# Patient Record
Sex: Female | Born: 1970
Health system: Southern US, Community
[De-identification: ages and names within clinical notes are randomized; demographics above are authoritative.]

## PROBLEM LIST (undated history)

## (undated) ENCOUNTER — Emergency Department (HOSPITAL_COMMUNITY): Admission: EM | Payer: Medicaid Other

## (undated) DIAGNOSIS — C259 Malignant neoplasm of pancreas, unspecified: Secondary | ICD-10-CM

## (undated) DIAGNOSIS — Z8049 Family history of malignant neoplasm of other genital organs: Secondary | ICD-10-CM

## (undated) DIAGNOSIS — K8020 Calculus of gallbladder without cholecystitis without obstruction: Secondary | ICD-10-CM

## (undated) DIAGNOSIS — Z8042 Family history of malignant neoplasm of prostate: Secondary | ICD-10-CM

## (undated) DIAGNOSIS — Z801 Family history of malignant neoplasm of trachea, bronchus and lung: Secondary | ICD-10-CM

## (undated) DIAGNOSIS — S270XXA Traumatic pneumothorax, initial encounter: Secondary | ICD-10-CM

## (undated) DIAGNOSIS — R112 Nausea with vomiting, unspecified: Secondary | ICD-10-CM

## (undated) DIAGNOSIS — Z9889 Other specified postprocedural states: Secondary | ICD-10-CM

## (undated) HISTORY — DX: Family history of malignant neoplasm of other genital organs: Z80.49

## (undated) HISTORY — DX: Family history of malignant neoplasm of trachea, bronchus and lung: Z80.1

## (undated) HISTORY — DX: Family history of malignant neoplasm of prostate: Z80.42

## (undated) HISTORY — PX: TONSILLECTOMY: SUR1361

---

## 2017-10-16 DIAGNOSIS — K8020 Calculus of gallbladder without cholecystitis without obstruction: Secondary | ICD-10-CM

## 2017-10-16 HISTORY — DX: Calculus of gallbladder without cholecystitis without obstruction: K80.20

## 2017-10-30 ENCOUNTER — Inpatient Hospital Stay (HOSPITAL_COMMUNITY): Payer: Medicaid Other

## 2017-10-30 ENCOUNTER — Emergency Department (HOSPITAL_COMMUNITY): Payer: Medicaid Other

## 2017-10-30 ENCOUNTER — Inpatient Hospital Stay (HOSPITAL_COMMUNITY)
Admission: EM | Admit: 2017-10-30 | Discharge: 2017-10-31 | DRG: 436 | Disposition: A | Discharge status: Home / Self Care | Payer: Medicaid Other | Attending: Internal Medicine | Admitting: Internal Medicine

## 2017-10-30 ENCOUNTER — Other Ambulatory Visit: Payer: Self-pay

## 2017-10-30 ENCOUNTER — Encounter (HOSPITAL_COMMUNITY): Payer: Self-pay

## 2017-10-30 DIAGNOSIS — R748 Abnormal levels of other serum enzymes: Secondary | ICD-10-CM | POA: Diagnosis present

## 2017-10-30 DIAGNOSIS — C259 Malignant neoplasm of pancreas, unspecified: Secondary | ICD-10-CM | POA: Diagnosis present

## 2017-10-30 DIAGNOSIS — R1011 Right upper quadrant pain: Secondary | ICD-10-CM

## 2017-10-30 DIAGNOSIS — K819 Cholecystitis, unspecified: Secondary | ICD-10-CM | POA: Diagnosis present

## 2017-10-30 DIAGNOSIS — K8020 Calculus of gallbladder without cholecystitis without obstruction: Secondary | ICD-10-CM

## 2017-10-30 DIAGNOSIS — R7989 Other specified abnormal findings of blood chemistry: Secondary | ICD-10-CM

## 2017-10-30 DIAGNOSIS — R17 Unspecified jaundice: Secondary | ICD-10-CM

## 2017-10-30 DIAGNOSIS — R945 Abnormal results of liver function studies: Secondary | ICD-10-CM

## 2017-10-30 DIAGNOSIS — L298 Other pruritus: Secondary | ICD-10-CM | POA: Diagnosis not present

## 2017-10-30 DIAGNOSIS — K831 Obstruction of bile duct: Secondary | ICD-10-CM

## 2017-10-30 DIAGNOSIS — Z720 Tobacco use: Secondary | ICD-10-CM | POA: Diagnosis not present

## 2017-10-30 DIAGNOSIS — K8689 Other specified diseases of pancreas: Secondary | ICD-10-CM

## 2017-10-30 DIAGNOSIS — K8041 Calculus of bile duct with cholecystitis, unspecified, with obstruction: Secondary | ICD-10-CM | POA: Diagnosis present

## 2017-10-30 DIAGNOSIS — F17210 Nicotine dependence, cigarettes, uncomplicated: Secondary | ICD-10-CM | POA: Diagnosis present

## 2017-10-30 DIAGNOSIS — L299 Pruritus, unspecified: Secondary | ICD-10-CM | POA: Diagnosis present

## 2017-10-30 DIAGNOSIS — F1010 Alcohol abuse, uncomplicated: Secondary | ICD-10-CM | POA: Diagnosis not present

## 2017-10-30 DIAGNOSIS — Z91040 Latex allergy status: Secondary | ICD-10-CM | POA: Diagnosis not present

## 2017-10-30 HISTORY — DX: Calculus of gallbladder without cholecystitis without obstruction: K80.20

## 2017-10-30 HISTORY — DX: Traumatic pneumothorax, initial encounter: S27.0XXA

## 2017-10-30 LAB — CBC WITH DIFFERENTIAL/PLATELET
Abs Immature Granulocytes: 0.03 10*3/uL (ref 0.00–0.07)
BASOS PCT: 0 %
Basophils Absolute: 0 10*3/uL (ref 0.0–0.1)
EOS PCT: 3 %
Eosinophils Absolute: 0.2 10*3/uL (ref 0.0–0.5)
HCT: 33.2 % — ABNORMAL LOW (ref 36.0–46.0)
Hemoglobin: 11 g/dL — ABNORMAL LOW (ref 12.0–15.0)
Immature Granulocytes: 0 %
Lymphocytes Relative: 19 %
Lymphs Abs: 1.3 10*3/uL (ref 0.7–4.0)
MCH: 21.3 pg — AB (ref 26.0–34.0)
MCHC: 33.1 g/dL (ref 30.0–36.0)
MCV: 64.3 fL — ABNORMAL LOW (ref 80.0–100.0)
MONO ABS: 0.4 10*3/uL (ref 0.1–1.0)
Monocytes Relative: 6 %
Neutro Abs: 4.8 10*3/uL (ref 1.7–7.7)
Neutrophils Relative %: 72 %
PLATELETS: 341 10*3/uL (ref 150–400)
RBC: 5.16 MIL/uL — AB (ref 3.87–5.11)
RDW: 17.9 % — ABNORMAL HIGH (ref 11.5–15.5)
WBC: 6.7 10*3/uL (ref 4.0–10.5)
nRBC: 0 % (ref 0.0–0.2)

## 2017-10-30 LAB — LIPASE, BLOOD: LIPASE: 39 U/L (ref 11–51)

## 2017-10-30 LAB — COMPREHENSIVE METABOLIC PANEL
ALK PHOS: 347 U/L — AB (ref 38–126)
ALT: 198 U/L — ABNORMAL HIGH (ref 0–44)
ANION GAP: 10 (ref 5–15)
AST: 135 U/L — ABNORMAL HIGH (ref 15–41)
Albumin: 3.5 g/dL (ref 3.5–5.0)
BUN: 8 mg/dL (ref 6–20)
CALCIUM: 9.1 mg/dL (ref 8.9–10.3)
CO2: 22 mmol/L (ref 22–32)
Chloride: 103 mmol/L (ref 98–111)
Creatinine, Ser: 0.64 mg/dL (ref 0.44–1.00)
GFR calc non Af Amer: >60 mL/min (ref >60)
Glucose, Bld: 106 mg/dL — ABNORMAL HIGH (ref 70–99)
Potassium: 3 mmol/L — ABNORMAL LOW (ref 3.5–5.1)
SODIUM: 135 mmol/L (ref 135–145)
Total Bilirubin: 6.6 mg/dL — ABNORMAL HIGH (ref 0.3–1.2)
Total Protein: 6.8 g/dL (ref 6.5–8.1)

## 2017-10-30 LAB — PROTIME-INR
INR: 0.92
Prothrombin Time: 12.3 seconds (ref 11.4–15.2)

## 2017-10-30 LAB — ETHANOL: Alcohol, Ethyl (B): <10 mg/dL (ref <10)

## 2017-10-30 LAB — I-STAT BETA HCG BLOOD, ED (MC, WL, AP ONLY)

## 2017-10-30 LAB — ACETAMINOPHEN LEVEL: Acetaminophen (Tylenol), Serum: <10 ug/mL — ABNORMAL LOW (ref 10–30)

## 2017-10-30 MED ORDER — LORAZEPAM 2 MG/ML IJ SOLN
1.0000 mg | Freq: Once | INTRAMUSCULAR | Status: AC
  Administered 2017-10-30: 1 mg via INTRAVENOUS
  Filled 2017-10-30: qty 1

## 2017-10-30 MED ORDER — LACTATED RINGERS IV SOLN
INTRAVENOUS | Status: DC
  Administered 2017-10-30 – 2017-10-31 (×4): via INTRAVENOUS

## 2017-10-30 MED ORDER — HYDROXYZINE HCL 50 MG/ML IM SOLN
50.0000 mg | Freq: Four times a day (QID) | INTRAMUSCULAR | Status: DC | PRN
  Administered 2017-10-30 – 2017-10-31 (×3): 50 mg via INTRAMUSCULAR
  Filled 2017-10-30 (×5): qty 1

## 2017-10-30 MED ORDER — DIPHENHYDRAMINE HCL 50 MG/ML IJ SOLN
25.0000 mg | Freq: Once | INTRAMUSCULAR | Status: AC
  Administered 2017-10-30: 25 mg via INTRAVENOUS
  Filled 2017-10-30: qty 1

## 2017-10-30 MED ORDER — GADOBUTROL 1 MMOL/ML IV SOLN
7.0000 mL | Freq: Once | INTRAVENOUS | Status: AC | PRN
  Administered 2017-10-30: 7 mL via INTRAVENOUS

## 2017-10-30 MED ORDER — ACETAMINOPHEN 325 MG PO TABS: 650.0000 mg | ORAL_TABLET | Freq: Four times a day (QID) | ORAL | Status: DC | PRN

## 2017-10-30 MED ORDER — NICOTINE 14 MG/24HR TD PT24
14.0000 mg | MEDICATED_PATCH | Freq: Every day | TRANSDERMAL | Status: DC
  Administered 2017-10-30 – 2017-10-31 (×2): 14 mg via TRANSDERMAL
  Filled 2017-10-30 (×2): qty 1

## 2017-10-30 MED ORDER — ACETAMINOPHEN 650 MG RE SUPP: 650.0000 mg | Freq: Four times a day (QID) | RECTAL | Status: DC | PRN

## 2017-10-30 MED ORDER — ONDANSETRON HCL 4 MG/2ML IJ SOLN: 4.0000 mg | Freq: Four times a day (QID) | INTRAMUSCULAR | Status: DC | PRN

## 2017-10-30 MED ORDER — CAMPHOR-MENTHOL 0.5-0.5 % EX LOTN
TOPICAL_LOTION | CUTANEOUS | Status: DC | PRN
  Administered 2017-10-30: 19:00:00 via TOPICAL
  Filled 2017-10-30: qty 222

## 2017-10-30 MED ORDER — MORPHINE SULFATE (PF) 2 MG/ML IV SOLN
2.0000 mg | INTRAVENOUS | Status: DC | PRN
  Administered 2017-10-30 – 2017-10-31 (×5): 2 mg via INTRAVENOUS
  Filled 2017-10-30 (×5): qty 1

## 2017-10-30 MED ORDER — ONDANSETRON HCL 4 MG PO TABS: 4.0000 mg | ORAL_TABLET | Freq: Four times a day (QID) | ORAL | Status: DC | PRN

## 2017-10-30 NOTE — H&P (View-Only) (Signed)
[facility] Consult: 1:39 PM 10/30/2017  LOS: 0 days    Referring Provider: Karmen Bongo MD  Primary Care Physician:  Patient, No Pcp Per Primary Gastroenterologist:  Althia Forts.       Reason for Consultation:  Jaundice, mass in head of pancrease on MR.     HPI: Jane Brooks is a 47 y.o. female.   PMH listed below. Moved to this area a few years ago from [place]. 3 weeks ago developed pain in the right upper quadrant which was quite tender to palpation.  Pain resolved but she developed pruritus which she initially attributed to a body wash product.  However she stopped using the product and the itch never resolved.  About a week after the pain resolved she saw her urine turned dark and it remained so despite her increasing her water consumption.  Stools turned gray.  Friends noticed scleral icterus.  She googled and realize she was jaundiced and continue to drink lots of water and take Benadryl for the itching.  Symptoms became intolerable and she presented to the ED.  Total bilirubin 6.6.  Alkaline phosphatase 347.  AST/ALT 135/198.  Lipase 39.  Potassium 3.  Renal function normal. Hgb 11, MCV 64.  Platelets normal. PT/INR not yet obtained.   Abdominal ultrasound with stone and sludge filled contracted gallbladder with gallbladder wall thickening.  Dominant extrahepatic bile ducts with 17.6 mm CBD.  Portal vein patent with normal directional flow by Doppler. MRI/MRCP: Marketed intra-and extrahepatic biliary ductal dilatation.  Abrupt cut off of the CBD at the pancreatic head.  Marketed dilation of main pancreatic duct also with abrupt cut off at the level of the pancreatic head.  Atrophy of the pancreatic parenchyma in the body and tail.  Subtle, slightly hypoenhancing,  2.5 x 3 cm lesion in the head  of the pancreas.  Altogether findings concerning for pancreatic adenocarcinoma.  EUS recommended to further evaluate.  PV patent although there is some mass-effect at the portal splenic confluence and IVC.  Fat planes around the celiac axis and SMA appear preserved.  Upper normal to borderline portocaval lymph node and small right para-aortic lymphadenopathy.   Pruritus is her biggest complaint. She is hungry but when she eats she tends to get nauseated and will vomit sometimes.  However right now she is asking if she can eat solid food.  Raquel Sarna history of gallbladder disease and uterine cancer in her mother who is living.  Alcohol history significant for heavy drinking regularly when she was in her 63s and 30s.  In the last 20 years she has been a binge drinker.  She will not drink for 3 or more months at a time but when she does drink she drinks for a week and can consume 2-3 bottles of wine daily during binge episodes.   Past Medical History:  Diagnosis Date  . Gallstones 10/2017  . Pneumothorax, closed, traumatic    years ago    Past Surgical History:  Procedure Laterality Date  . TONSILLECTOMY      Prior to Admission medications  Medication Sig Start Date End Date Taking? Authorizing Provider  diphenhydrAMINE (BENADRYL) 25 MG tablet Take 25 mg by mouth every 6 (six) hours as needed for itching.   Yes [provider]    Scheduled Meds: . nicotine  14 mg Transdermal Daily   Infusions: . lactated ringers 125 mL/hr at 10/30/17 1025   PRN Meds: acetaminophen **OR** acetaminophen, hydrOXYzine, morphine injection, ondansetron **OR** ondansetron (ZOFRAN) IV   Allergies as of 10/30/2017 - Review Complete 10/30/2017  Allergen Reaction Noted  . Latex Hives 10/30/2017    History reviewed. No pertinent family history.  Social History   Socioeconomic History  . Marital status: Single    Spouse name: Not on file  . Number of children: Not on file  . Years of education:  Not on file  . Highest education level: Not on file  Occupational History  . Occupation: unemployed  Social Needs  . Financial resource strain: Not on file  . Food insecurity:    Worry: Not on file    Inability: Not on file  . Transportation needs:    Medical: Not on file    Non-medical: Not on file  Tobacco Use  . Smoking status: Current Every Day Smoker    Packs/day: 1.00    Years: 37.00    Pack years: 37.00  . Smokeless tobacco: Never Used  Substance and Sexual Activity  . 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 Drug use: Never  . Sexual activity: Not on file  Lifestyle  . Physical activity:    Days per week: Not on file    Minutes per session: Not on file  . Stress: Not on file  Relationships  . Social connections:    Talks on phone: Not on file    Gets together: Not on file    Attends religious service: Not on file    Active member of club or organization: Not on file    Attends meetings of clubs or organizations: Not on file    Relationship status: Not on file  . Intimate partner violence:    Fear of current or ex partner: Not on file    Emotionally abused: Not on file    Physically abused: Not on file    Forced sexual activity: Not on file  Other Topics Concern  . Not on file  Social History Narrative  . Not on file    REVIEW OF SYSTEMS: Constitutional: Some fatigue but not profound.  Feels a bit worn out with all of her symptoms. ENT:  No nose bleeds Pulm: Shortness of breath. CV:  No palpitations, no LE edema.  GU:  No hematuria, no frequency GI:  Per HPI Heme: No unusual bleeding or bruising. Transfusions: None. Neuro:  No headaches, no peripheral tingling or numbness.   No syncope. Derm:  No itching, no rash or sores.  Endocrine:  No sweats or chills.  No polyuria or dysuria.   Immunization: Queried. Travel:  None beyond local counties in last few months.    PHYSICAL EXAM: Vital signs in last 24  hours: Vitals:   10/30/17 0805 10/30/17 0949  BP: 122/79 114/77  Pulse: 69 69  Resp: 17 16  Temp:  97.7 F (36.5 C)  SpO2: 100% 100%   Wt Readings from Last 3 Encounters:  10/30/17 57.6 kg    General: Jaundiced.  Does not look ill.  She is alert and provides good history. Head: Facial asymmetry or  swelling.  No signs of head trauma. Eyes: Scleral icterus.  No conjunctival pallor.  EOMI Ears: Not hard of hearing Nose: No discharge Mouth: Tongue midline.  Oral mucosa moist, pink, clear. Neck: No JVD, no masses, no thyromegaly. Lungs: Clear bilaterally.  No labored breathing or cough. Heart: RRR.  No MRG.  S1, S2 present Abdomen: Tender right upper quadrant.  No palpable masses.  Active bowel sounds.  No distention..   Rectal: Deferred Musc/Skeltl: No joint redness or swelling or gross deformity. Extremities: No CCE. Neurologic: Oriented x3.  Moves all 4 limbs.  No tremors.  No asterixis. Skin: Jaundiced.  Innumerable small punctate lesions and linear excoriations from her scratching.  These are present on the arms trunk and legs. Nodes: No cervical or inguinal adenopathy. Psych: Cooperative, pleasant, slightly anxious.  Appropriate.  Intake/Output from previous day: No intake/output data recorded. Intake/Output this shift: No intake/output data recorded.  LAB RESULTS: Recent Labs    10/30/17 0524  WBC 6.7  HGB 11.0*  HCT 33.2*  PLT 341   BMET Lab Results  Component Value Date   NA 135 10/30/2017   K 3.0 (L) 10/30/2017   CL 103 10/30/2017   CO2 22 10/30/2017   GLUCOSE 106 (H) 10/30/2017   BUN 8 10/30/2017   CREATININE 0.64 10/30/2017   CALCIUM 9.1 10/30/2017   LFT Recent Labs    10/30/17 0524  PROT 6.8  ALBUMIN 3.5  AST 135*  ALT 198*  ALKPHOS 347*  BILITOT 6.6*   PT/INR No results found for: INR, PROTIME Hepatitis Panel No results for input(s): HEPBSAG, HCVAB, HEPAIGM, HEPBIGM in the last 72 hours. C-Diff No components found for: CDIFF Lipase      Component Value Date/Time   LIPASE 39 10/30/2017 0524    Drugs of Abuse  No results found for: LABOPIA, COCAINSCRNUR, LABBENZ, AMPHETMU, THCU, LABBARB   RADIOLOGY STUDIES: Mr 3d Recon At Scanner  Result Date: 10/30/2017 CLINICAL DATA:  Right upper quadrant pain and abnormal LFTs. EXAM: MRI ABDOMEN WITHOUT AND WITH CONTRAST (INCLUDING MRCP) TECHNIQUE: Multiplanar multisequence MR imaging of the abdomen was performed both before and after the administration of intravenous contrast. Heavily T2-weighted images of the biliary and pancreatic ducts were obtained, and three-dimensional MRCP images were rendered by post processing. CONTRAST:  7 cc Gadavist COMPARISON:  Ultrasound exam from earlier the same day FINDINGS: Lower chest: Unremarkable. Hepatobiliary: No focal mass lesion within the hepatic parenchyma. Gallbladder is nondistended and there may be some small stones cords the gallbladder neck. No substantial pericholecystic fluid or edema. There is marked intra and extrahepatic biliary duct dilatation. Common duct measures up to 16 mm diameter. The common duct abruptly terminates as it enters the pancreatic head. Pancreas: The pancreatic duct in the body and tail the pancreas is markedly dilated irregular, measuring up to 10 mm diameter. Pancreatic parenchyma in the body and tail of the pancreas is almost completely atrophic. Pancreatic duct abruptly terminates in the pancreatic neck region. Although very subtle, there appears to be a 3 x 2.5 cm area of abnormal signal intensity in the head of the pancreas against which both the common bile duct and the pancreatic duct terminate. Spleen:  No splenomegaly. No focal mass lesion. Adrenals/Urinary Tract: No adrenal nodule or mass. Kidneys are unremarkable. Stomach/Bowel: Stomach is nondistended. No gastric wall thickening. No evidence of outlet obstruction. Duodenum is normally positioned as is the ligament of Treitz. No small bowel or colonic dilatation  within the visualized abdomen. Vascular/Lymphatic: No abdominal aortic  aneurysm. Portal vein is patent although there is some mass-effect I the process in the head of pancreas. 12 mm short axis portal caval lymph node generate some mass-effect on the IVC. Celiac axis and SMA are patent and fat signal around each vessel appears preserved. Small aortocaval lymph nodes are evident on postcontrast imaging. Other:  No intraperitoneal free fluid. Musculoskeletal: No abnormal marrow enhancement within the visualized bony anatomy. IMPRESSION: 1. Marked intra and extrahepatic biliary duct dilatation with abrupt cut off of the common bile duct at the pancreatic head. This is associated with marked dilatation of the main pancreatic duct also with an abrupt cut off at the level of the pancreatic head. Pancreatic parenchyma in the body and tail of pancreas is markedly atrophic. A subtle 2.5 x 3 cm lesion with differential signal intensity is identified in the head of the pancreas which appears slightly hypoenhancing on postcontrast imaging. Together, these features are highly concerning for pancreatic adenocarcinoma. EUS is recommended to further evaluate. 2. Portal vein is patent although there is some mass-effect on the portal splenic confluence and IVC. Fat planes around celiac axis and SMA appear preserved. 3. Upper normal to borderline portal caval lymph node associated with small right para-aortic lymphadenopathy. Electronically Signed   By: Misty Stanley M.D.   On: 10/30/2017 12:48   Mr Abdomen Mrcp Moise Boring Contast  Result Date: 10/30/2017 CLINICAL DATA:  Right upper quadrant pain and abnormal LFTs. EXAM: MRI ABDOMEN WITHOUT AND WITH CONTRAST (INCLUDING MRCP) TECHNIQUE: Multiplanar multisequence MR imaging of the abdomen was performed both before and after the administration of intravenous contrast. Heavily T2-weighted images of the biliary and pancreatic ducts were obtained, and three-dimensional MRCP images were  rendered by post processing. CONTRAST:  7 cc Gadavist COMPARISON:  Ultrasound exam from earlier the same day FINDINGS: Lower chest: Unremarkable. Hepatobiliary: No focal mass lesion within the hepatic parenchyma. Gallbladder is nondistended and there may be some small stones cords the gallbladder neck. No substantial pericholecystic fluid or edema. There is marked intra and extrahepatic biliary duct dilatation. Common duct measures up to 16 mm diameter. The common duct abruptly terminates as it enters the pancreatic head. Pancreas: The pancreatic duct in the body and tail the pancreas is markedly dilated irregular, measuring up to 10 mm diameter. Pancreatic parenchyma in the body and tail of the pancreas is almost completely atrophic. Pancreatic duct abruptly terminates in the pancreatic neck region. Although very subtle, there appears to be a 3 x 2.5 cm area of abnormal signal intensity in the head of the pancreas against which both the common bile duct and the pancreatic duct terminate. Spleen:  No splenomegaly. No focal mass lesion. Adrenals/Urinary Tract: No adrenal nodule or mass. Kidneys are unremarkable. Stomach/Bowel: Stomach is nondistended. No gastric wall thickening. No evidence of outlet obstruction. Duodenum is normally positioned as is the ligament of Treitz. No small bowel or colonic dilatation within the visualized abdomen. Vascular/Lymphatic: No abdominal aortic aneurysm. Portal vein is patent although there is some mass-effect I the process in the head of pancreas. 12 mm short axis portal caval lymph node generate some mass-effect on the IVC. Celiac axis and SMA are patent and fat signal around each vessel appears preserved. Small aortocaval lymph nodes are evident on postcontrast imaging. Other:  No intraperitoneal free fluid. Musculoskeletal: No abnormal marrow enhancement within the visualized bony anatomy. IMPRESSION: 1. Marked intra and extrahepatic biliary duct dilatation with abrupt cut off  of the common bile duct at the pancreatic head. This  is associated with marked dilatation of the main pancreatic duct also with an abrupt cut off at the level of the pancreatic head. Pancreatic parenchyma in the body and tail of pancreas is markedly atrophic. A subtle 2.5 x 3 cm lesion with differential signal intensity is identified in the head of the pancreas which appears slightly hypoenhancing on postcontrast imaging. Together, these features are highly concerning for pancreatic adenocarcinoma. EUS is recommended to further evaluate. 2. Portal vein is patent although there is some mass-effect on the portal splenic confluence and IVC. Fat planes around celiac axis and SMA appear preserved. 3. Upper normal to borderline portal caval lymph node associated with small right para-aortic lymphadenopathy. Electronically Signed   By: Misty Stanley M.D.   On: 10/30/2017 12:48   US Abdomen Limited Ruq  Result Date: 10/30/2017 CLINICAL DATA:  Right upper quadrant pain for 5 weeks EXAM: ULTRASOUND ABDOMEN LIMITED RIGHT UPPER QUADRANT COMPARISON:  None. FINDINGS: Gallbladder: Gallbladder is contracted and filled with multiple stones and sludge. Gallbladder wall is thickened at 4 mm. Murphy's sign is negative. Common bile duct: Diameter: Prominent dilatation at 17.6 mm. No filling defects identified although the distal bile duct is obscured by bowel gas. Liver: No focal lesion identified. Within normal limits in parenchymal echogenicity. Portal vein is patent on color Doppler imaging with normal direction of blood flow towards the liver. IMPRESSION: Contracted gallbladder filled with small stones and sludge. Gallbladder wall thickening. Changes may indicate cholecystitis in the appropriate clinical setting. Prominent dilatation of extrahepatic bile ducts. Consider obstruction versus choledochal cyst Electronically Signed   By: Lucienne Capers M.D.   On: 10/30/2017 06:31     IMPRESSION:   *    Obstructive jaundice.   Looks to have tumor at the head of the pancreas. Symptoms from this of pruritus.    PLAN:     *    ERCP with brushings and probable stenting tomorrow with Dr. Carlean Purl.  Set up for 10:00.  Will need EUS at a time in the future, possibly on Friday.  *   Ordered PT/INR and CA-19-9.  Azucena Freed  10/30/2017, 1:39 PM Phone [phone]

## 2017-10-30 NOTE — H&P (View-Only) (Signed)
[facility] Consult: 1:39 PM 10/30/2017  LOS: 0 days    Referring Provider: Karmen Bongo MD  Primary Care Physician:  Patient, No Pcp Per Primary Gastroenterologist:  Althia Forts.       Reason for Consultation:  Jaundice, mass in head of pancrease on MR.     HPI: Jane Brooks is a 47 y.o. female.   PMH listed below. Moved to this area a few years ago from [place]. 3 weeks ago developed pain in the right upper quadrant which was quite tender to palpation.  Pain resolved but she developed pruritus which she initially attributed to a body wash product.  However she stopped using the product and the itch never resolved.  About a week after the pain resolved she saw her urine turned dark and it remained so despite her increasing her water consumption.  Stools turned gray.  Friends noticed scleral icterus.  She googled and realize she was jaundiced and continue to drink lots of water and take Benadryl for the itching.  Symptoms became intolerable and she presented to the ED.  Total bilirubin 6.6.  Alkaline phosphatase 347.  AST/ALT 135/198.  Lipase 39.  Potassium 3.  Renal function normal. Hgb 11, MCV 64.  Platelets normal. PT/INR not yet obtained.   Abdominal ultrasound with stone and sludge filled contracted gallbladder with gallbladder wall thickening.  Dominant extrahepatic bile ducts with 17.6 mm CBD.  Portal vein patent with normal directional flow by Doppler. MRI/MRCP: Marketed intra-and extrahepatic biliary ductal dilatation.  Abrupt cut off of the CBD at the pancreatic head.  Marketed dilation of main pancreatic duct also with abrupt cut off at the level of the pancreatic head.  Atrophy of the pancreatic parenchyma in the body and tail.  Subtle, slightly hypoenhancing,  2.5 x 3 cm lesion in the head  of the pancreas.  Altogether findings concerning for pancreatic adenocarcinoma.  EUS recommended to further evaluate.  PV patent although there is some mass-effect at the portal splenic confluence and IVC.  Fat planes around the celiac axis and SMA appear preserved.  Upper normal to borderline portocaval lymph node and small right para-aortic lymphadenopathy.   Pruritus is her biggest complaint. She is hungry but when she eats she tends to get nauseated and will vomit sometimes.  However right now she is asking if she can eat solid food.  Raquel Sarna history of gallbladder disease and uterine cancer in her mother who is living.  Alcohol history significant for heavy drinking regularly when she was in her 50s and 30s.  In the last 20 years she has been a binge drinker.  She will not drink for 3 or more months at a time but when she does drink she drinks for a week and can consume 2-3 bottles of wine daily during binge episodes.   Past Medical History:  Diagnosis Date  . Gallstones 10/2017  . Pneumothorax, closed, traumatic    years ago    Past Surgical History:  Procedure Laterality Date  . TONSILLECTOMY      Prior to Admission medications  Medication Sig Start Date End Date Taking? Authorizing Provider  diphenhydrAMINE (BENADRYL) 25 MG tablet Take 25 mg by mouth every 6 (six) hours as needed for itching.   Yes [provider]    Scheduled Meds: . nicotine  14 mg Transdermal Daily   Infusions: . lactated ringers 125 mL/hr at 10/30/17 1025   PRN Meds: acetaminophen **OR** acetaminophen, hydrOXYzine, morphine injection, ondansetron **OR** ondansetron (ZOFRAN) IV   Allergies as of 10/30/2017 - Review Complete 10/30/2017  Allergen Reaction Noted  . Latex Hives 10/30/2017    History reviewed. No pertinent family history.  Social History   Socioeconomic History  . Marital status: Single    Spouse name: Not on file  . Number of children: Not on file  . Years of education:  Not on file  . Highest education level: Not on file  Occupational History  . Occupation: unemployed  Social Needs  . Financial resource strain: Not on file  . Food insecurity:    Worry: Not on file    Inability: Not on file  . Transportation needs:    Medical: Not on file    Non-medical: Not on file  Tobacco Use  . Smoking status: Current Every Day Smoker    Packs/day: 1.00    Years: 37.00    Pack years: 37.00  . Smokeless tobacco: Never Used  Substance and Sexual Activity  . 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 Drug use: Never  . Sexual activity: Not on file  Lifestyle  . Physical activity:    Days per week: Not on file    Minutes per session: Not on file  . Stress: Not on file  Relationships  . Social connections:    Talks on phone: Not on file    Gets together: Not on file    Attends religious service: Not on file    Active member of club or organization: Not on file    Attends meetings of clubs or organizations: Not on file    Relationship status: Not on file  . Intimate partner violence:    Fear of current or ex partner: Not on file    Emotionally abused: Not on file    Physically abused: Not on file    Forced sexual activity: Not on file  Other Topics Concern  . Not on file  Social History Narrative  . Not on file    REVIEW OF SYSTEMS: Constitutional: Some fatigue but not profound.  Feels a bit worn out with all of her symptoms. ENT:  No nose bleeds Pulm: Shortness of breath. CV:  No palpitations, no LE edema.  GU:  No hematuria, no frequency GI:  Per HPI Heme: No unusual bleeding or bruising. Transfusions: None. Neuro:  No headaches, no peripheral tingling or numbness.   No syncope. Derm:  No itching, no rash or sores.  Endocrine:  No sweats or chills.  No polyuria or dysuria.   Immunization: Queried. Travel:  None beyond local counties in last few months.    PHYSICAL EXAM: Vital signs in last 24  hours: Vitals:   10/30/17 0805 10/30/17 0949  BP: 122/79 114/77  Pulse: 69 69  Resp: 17 16  Temp:  97.7 F (36.5 C)  SpO2: 100% 100%   Wt Readings from Last 3 Encounters:  10/30/17 57.6 kg    General: Jaundiced.  Does not look ill.  She is alert and provides good history. Head: Facial asymmetry or  swelling.  No signs of head trauma. Eyes: Scleral icterus.  No conjunctival pallor.  EOMI Ears: Not hard of hearing Nose: No discharge Mouth: Tongue midline.  Oral mucosa moist, pink, clear. Neck: No JVD, no masses, no thyromegaly. Lungs: Clear bilaterally.  No labored breathing or cough. Heart: RRR.  No MRG.  S1, S2 present Abdomen: Tender right upper quadrant.  No palpable masses.  Active bowel sounds.  No distention..   Rectal: Deferred Musc/Skeltl: No joint redness or swelling or gross deformity. Extremities: No CCE. Neurologic: Oriented x3.  Moves all 4 limbs.  No tremors.  No asterixis. Skin: Jaundiced.  Innumerable small punctate lesions and linear excoriations from her scratching.  These are present on the arms trunk and legs. Nodes: No cervical or inguinal adenopathy. Psych: Cooperative, pleasant, slightly anxious.  Appropriate.  Intake/Output from previous day: No intake/output data recorded. Intake/Output this shift: No intake/output data recorded.  LAB RESULTS: Recent Labs    10/30/17 0524  WBC 6.7  HGB 11.0*  HCT 33.2*  PLT 341   BMET Lab Results  Component Value Date   NA 135 10/30/2017   K 3.0 (L) 10/30/2017   CL 103 10/30/2017   CO2 22 10/30/2017   GLUCOSE 106 (H) 10/30/2017   BUN 8 10/30/2017   CREATININE 0.64 10/30/2017   CALCIUM 9.1 10/30/2017   LFT Recent Labs    10/30/17 0524  PROT 6.8  ALBUMIN 3.5  AST 135*  ALT 198*  ALKPHOS 347*  BILITOT 6.6*   PT/INR No results found for: INR, PROTIME Hepatitis Panel No results for input(s): HEPBSAG, HCVAB, HEPAIGM, HEPBIGM in the last 72 hours. C-Diff No components found for: CDIFF Lipase      Component Value Date/Time   LIPASE 39 10/30/2017 0524    Drugs of Abuse  No results found for: LABOPIA, COCAINSCRNUR, LABBENZ, AMPHETMU, THCU, LABBARB   RADIOLOGY STUDIES: Mr 3d Recon At Scanner  Result Date: 10/30/2017 CLINICAL DATA:  Right upper quadrant pain and abnormal LFTs. EXAM: MRI ABDOMEN WITHOUT AND WITH CONTRAST (INCLUDING MRCP) TECHNIQUE: Multiplanar multisequence MR imaging of the abdomen was performed both before and after the administration of intravenous contrast. Heavily T2-weighted images of the biliary and pancreatic ducts were obtained, and three-dimensional MRCP images were rendered by post processing. CONTRAST:  7 cc Gadavist COMPARISON:  Ultrasound exam from earlier the same day FINDINGS: Lower chest: Unremarkable. Hepatobiliary: No focal mass lesion within the hepatic parenchyma. Gallbladder is nondistended and there may be some small stones cords the gallbladder neck. No substantial pericholecystic fluid or edema. There is marked intra and extrahepatic biliary duct dilatation. Common duct measures up to 16 mm diameter. The common duct abruptly terminates as it enters the pancreatic head. Pancreas: The pancreatic duct in the body and tail the pancreas is markedly dilated irregular, measuring up to 10 mm diameter. Pancreatic parenchyma in the body and tail of the pancreas is almost completely atrophic. Pancreatic duct abruptly terminates in the pancreatic neck region. Although very subtle, there appears to be a 3 x 2.5 cm area of abnormal signal intensity in the head of the pancreas against which both the common bile duct and the pancreatic duct terminate. Spleen:  No splenomegaly. No focal mass lesion. Adrenals/Urinary Tract: No adrenal nodule or mass. Kidneys are unremarkable. Stomach/Bowel: Stomach is nondistended. No gastric wall thickening. No evidence of outlet obstruction. Duodenum is normally positioned as is the ligament of Treitz. No small bowel or colonic dilatation  within the visualized abdomen. Vascular/Lymphatic: No abdominal aortic  aneurysm. Portal vein is patent although there is some mass-effect I the process in the head of pancreas. 12 mm short axis portal caval lymph node generate some mass-effect on the IVC. Celiac axis and SMA are patent and fat signal around each vessel appears preserved. Small aortocaval lymph nodes are evident on postcontrast imaging. Other:  No intraperitoneal free fluid. Musculoskeletal: No abnormal marrow enhancement within the visualized bony anatomy. IMPRESSION: 1. Marked intra and extrahepatic biliary duct dilatation with abrupt cut off of the common bile duct at the pancreatic head. This is associated with marked dilatation of the main pancreatic duct also with an abrupt cut off at the level of the pancreatic head. Pancreatic parenchyma in the body and tail of pancreas is markedly atrophic. A subtle 2.5 x 3 cm lesion with differential signal intensity is identified in the head of the pancreas which appears slightly hypoenhancing on postcontrast imaging. Together, these features are highly concerning for pancreatic adenocarcinoma. EUS is recommended to further evaluate. 2. Portal vein is patent although there is some mass-effect on the portal splenic confluence and IVC. Fat planes around celiac axis and SMA appear preserved. 3. Upper normal to borderline portal caval lymph node associated with small right para-aortic lymphadenopathy. Electronically Signed   By: Misty Stanley M.D.   On: 10/30/2017 12:48   Mr Abdomen Mrcp Moise Boring Contast  Result Date: 10/30/2017 CLINICAL DATA:  Right upper quadrant pain and abnormal LFTs. EXAM: MRI ABDOMEN WITHOUT AND WITH CONTRAST (INCLUDING MRCP) TECHNIQUE: Multiplanar multisequence MR imaging of the abdomen was performed both before and after the administration of intravenous contrast. Heavily T2-weighted images of the biliary and pancreatic ducts were obtained, and three-dimensional MRCP images were  rendered by post processing. CONTRAST:  7 cc Gadavist COMPARISON:  Ultrasound exam from earlier the same day FINDINGS: Lower chest: Unremarkable. Hepatobiliary: No focal mass lesion within the hepatic parenchyma. Gallbladder is nondistended and there may be some small stones cords the gallbladder neck. No substantial pericholecystic fluid or edema. There is marked intra and extrahepatic biliary duct dilatation. Common duct measures up to 16 mm diameter. The common duct abruptly terminates as it enters the pancreatic head. Pancreas: The pancreatic duct in the body and tail the pancreas is markedly dilated irregular, measuring up to 10 mm diameter. Pancreatic parenchyma in the body and tail of the pancreas is almost completely atrophic. Pancreatic duct abruptly terminates in the pancreatic neck region. Although very subtle, there appears to be a 3 x 2.5 cm area of abnormal signal intensity in the head of the pancreas against which both the common bile duct and the pancreatic duct terminate. Spleen:  No splenomegaly. No focal mass lesion. Adrenals/Urinary Tract: No adrenal nodule or mass. Kidneys are unremarkable. Stomach/Bowel: Stomach is nondistended. No gastric wall thickening. No evidence of outlet obstruction. Duodenum is normally positioned as is the ligament of Treitz. No small bowel or colonic dilatation within the visualized abdomen. Vascular/Lymphatic: No abdominal aortic aneurysm. Portal vein is patent although there is some mass-effect I the process in the head of pancreas. 12 mm short axis portal caval lymph node generate some mass-effect on the IVC. Celiac axis and SMA are patent and fat signal around each vessel appears preserved. Small aortocaval lymph nodes are evident on postcontrast imaging. Other:  No intraperitoneal free fluid. Musculoskeletal: No abnormal marrow enhancement within the visualized bony anatomy. IMPRESSION: 1. Marked intra and extrahepatic biliary duct dilatation with abrupt cut off  of the common bile duct at the pancreatic head. This  is associated with marked dilatation of the main pancreatic duct also with an abrupt cut off at the level of the pancreatic head. Pancreatic parenchyma in the body and tail of pancreas is markedly atrophic. A subtle 2.5 x 3 cm lesion with differential signal intensity is identified in the head of the pancreas which appears slightly hypoenhancing on postcontrast imaging. Together, these features are highly concerning for pancreatic adenocarcinoma. EUS is recommended to further evaluate. 2. Portal vein is patent although there is some mass-effect on the portal splenic confluence and IVC. Fat planes around celiac axis and SMA appear preserved. 3. Upper normal to borderline portal caval lymph node associated with small right para-aortic lymphadenopathy. Electronically Signed   By: Misty Stanley M.D.   On: 10/30/2017 12:48   US Abdomen Limited Ruq  Result Date: 10/30/2017 CLINICAL DATA:  Right upper quadrant pain for 5 weeks EXAM: ULTRASOUND ABDOMEN LIMITED RIGHT UPPER QUADRANT COMPARISON:  None. FINDINGS: Gallbladder: Gallbladder is contracted and filled with multiple stones and sludge. Gallbladder wall is thickened at 4 mm. Murphy's sign is negative. Common bile duct: Diameter: Prominent dilatation at 17.6 mm. No filling defects identified although the distal bile duct is obscured by bowel gas. Liver: No focal lesion identified. Within normal limits in parenchymal echogenicity. Portal vein is patent on color Doppler imaging with normal direction of blood flow towards the liver. IMPRESSION: Contracted gallbladder filled with small stones and sludge. Gallbladder wall thickening. Changes may indicate cholecystitis in the appropriate clinical setting. Prominent dilatation of extrahepatic bile ducts. Consider obstruction versus choledochal cyst Electronically Signed   By: Lucienne Capers M.D.   On: 10/30/2017 06:31     IMPRESSION:   *    Obstructive jaundice.   Looks to have tumor at the head of the pancreas. Symptoms from this of pruritus.    PLAN:     *    ERCP with brushings and probable stenting tomorrow with Dr. Carlean Purl.  Set up for 10:00.  Will need EUS at a time in the future, possibly on Friday.  *   Ordered PT/INR and CA-19-9.  Azucena Freed  10/30/2017, 1:39 PM Phone [phone]

## 2017-10-30 NOTE — ED Triage Notes (Signed)
Patient from home complaining of itching x 3 weeks. Reports trying Benadryl without relief. Denies trying new soaps or detergents. Patient also reports "yellowing eyes" x 2 weeks. Denies chest pain, SOB, abdominal pain, or dysuria. Patient states "everything is itchy, I mean EVERYTHING".

## 2017-10-30 NOTE — Consult Note (Signed)
[facility] Consult: 1:39 PM 10/30/2017  LOS: 0 days    Referring Provider: Karmen Bongo MD  Primary Care Physician:  Patient, No Pcp Per Primary Gastroenterologist:  Althia Forts.       Reason for Consultation:  Jaundice, mass in head of pancrease on MR.     HPI: Jane Brooks is a 47 y.o. female.   PMH listed below. Moved to this area a few years ago from [place]. 3 weeks ago developed pain in the right upper quadrant which was quite tender to palpation.  Pain resolved but she developed pruritus which she initially attributed to a body wash product.  However she stopped using the product and the itch never resolved.  About a week after the pain resolved she saw her urine turned dark and it remained so despite her increasing her water consumption.  Stools turned gray.  Friends noticed scleral icterus.  She googled and realize she was jaundiced and continue to drink lots of water and take Benadryl for the itching.  Symptoms became intolerable and she presented to the ED.  Total bilirubin 6.6.  Alkaline phosphatase 347.  AST/ALT 135/198.  Lipase 39.  Potassium 3.  Renal function normal. Hgb 11, MCV 64.  Platelets normal. PT/INR not yet obtained.   Abdominal ultrasound with stone and sludge filled contracted gallbladder with gallbladder wall thickening.  Dominant extrahepatic bile ducts with 17.6 mm CBD.  Portal vein patent with normal directional flow by Doppler. MRI/MRCP: Marketed intra-and extrahepatic biliary ductal dilatation.  Abrupt cut off of the CBD at the pancreatic head.  Marketed dilation of main pancreatic duct also with abrupt cut off at the level of the pancreatic head.  Atrophy of the pancreatic parenchyma in the body and tail.  Subtle, slightly hypoenhancing,  2.5 x 3 cm lesion in the head  of the pancreas.  Altogether findings concerning for pancreatic adenocarcinoma.  EUS recommended to further evaluate.  PV patent although there is some mass-effect at the portal splenic confluence and IVC.  Fat planes around the celiac axis and SMA appear preserved.  Upper normal to borderline portocaval lymph node and small right para-aortic lymphadenopathy.   Pruritus is her biggest complaint. She is hungry but when she eats she tends to get nauseated and will vomit sometimes.  However right now she is asking if she can eat solid food.  Raquel Sarna history of gallbladder disease and uterine cancer in her mother who is living.  Alcohol history significant for heavy drinking regularly when she was in her 91s and 30s.  In the last 20 years she has been a binge drinker.  She will not drink for 3 or more months at a time but when she does drink she drinks for a week and can consume 2-3 bottles of wine daily during binge episodes.   Past Medical History:  Diagnosis Date  . Gallstones 10/2017  . Pneumothorax, closed, traumatic    years ago    Past Surgical History:  Procedure Laterality Date  . TONSILLECTOMY      Prior to Admission medications  Medication Sig Start Date End Date Taking? Authorizing Provider  diphenhydrAMINE (BENADRYL) 25 MG tablet Take 25 mg by mouth every 6 (six) hours as needed for itching.   Yes [provider]    Scheduled Meds: . nicotine  14 mg Transdermal Daily   Infusions: . lactated ringers 125 mL/hr at 10/30/17 1025   PRN Meds: acetaminophen **OR** acetaminophen, hydrOXYzine, morphine injection, ondansetron **OR** ondansetron (ZOFRAN) IV   Allergies as of 10/30/2017 - Review Complete 10/30/2017  Allergen Reaction Noted  . Latex Hives 10/30/2017    History reviewed. No pertinent family history.  Social History   Socioeconomic History  . Marital status: Single    Spouse name: Not on file  . Number of children: Not on file  . Years of education:  Not on file  . Highest education level: Not on file  Occupational History  . Occupation: unemployed  Social Needs  . Financial resource strain: Not on file  . Food insecurity:    Worry: Not on file    Inability: Not on file  . Transportation needs:    Medical: Not on file    Non-medical: Not on file  Tobacco Use  . Smoking status: Current Every Day Smoker    Packs/day: 1.00    Years: 37.00    Pack years: 37.00  . Smokeless tobacco: Never Used  Substance and Sexual Activity  . 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 Drug use: Never  . Sexual activity: Not on file  Lifestyle  . Physical activity:    Days per week: Not on file    Minutes per session: Not on file  . Stress: Not on file  Relationships  . Social connections:    Talks on phone: Not on file    Gets together: Not on file    Attends religious service: Not on file    Active member of club or organization: Not on file    Attends meetings of clubs or organizations: Not on file    Relationship status: Not on file  . Intimate partner violence:    Fear of current or ex partner: Not on file    Emotionally abused: Not on file    Physically abused: Not on file    Forced sexual activity: Not on file  Other Topics Concern  . Not on file  Social History Narrative  . Not on file    REVIEW OF SYSTEMS: Constitutional: Some fatigue but not profound.  Feels a bit worn out with all of her symptoms. ENT:  No nose bleeds Pulm: Shortness of breath. CV:  No palpitations, no LE edema.  GU:  No hematuria, no frequency GI:  Per HPI Heme: No unusual bleeding or bruising. Transfusions: None. Neuro:  No headaches, no peripheral tingling or numbness.   No syncope. Derm:  No itching, no rash or sores.  Endocrine:  No sweats or chills.  No polyuria or dysuria.   Immunization: Queried. Travel:  None beyond local counties in last few months.    PHYSICAL EXAM: Vital signs in last 24  hours: Vitals:   10/30/17 0805 10/30/17 0949  BP: 122/79 114/77  Pulse: 69 69  Resp: 17 16  Temp:  97.7 F (36.5 C)  SpO2: 100% 100%   Wt Readings from Last 3 Encounters:  10/30/17 57.6 kg    General: Jaundiced.  Does not look ill.  She is alert and provides good history. Head: Facial asymmetry or  swelling.  No signs of head trauma. Eyes: Scleral icterus.  No conjunctival pallor.  EOMI Ears: Not hard of hearing Nose: No discharge Mouth: Tongue midline.  Oral mucosa moist, pink, clear. Neck: No JVD, no masses, no thyromegaly. Lungs: Clear bilaterally.  No labored breathing or cough. Heart: RRR.  No MRG.  S1, S2 present Abdomen: Tender right upper quadrant.  No palpable masses.  Active bowel sounds.  No distention..   Rectal: Deferred Musc/Skeltl: No joint redness or swelling or gross deformity. Extremities: No CCE. Neurologic: Oriented x3.  Moves all 4 limbs.  No tremors.  No asterixis. Skin: Jaundiced.  Innumerable small punctate lesions and linear excoriations from her scratching.  These are present on the arms trunk and legs. Nodes: No cervical or inguinal adenopathy. Psych: Cooperative, pleasant, slightly anxious.  Appropriate.  Intake/Output from previous day: No intake/output data recorded. Intake/Output this shift: No intake/output data recorded.  LAB RESULTS: Recent Labs    10/30/17 0524  WBC 6.7  HGB 11.0*  HCT 33.2*  PLT 341   BMET Lab Results  Component Value Date   NA 135 10/30/2017   K 3.0 (L) 10/30/2017   CL 103 10/30/2017   CO2 22 10/30/2017   GLUCOSE 106 (H) 10/30/2017   BUN 8 10/30/2017   CREATININE 0.64 10/30/2017   CALCIUM 9.1 10/30/2017   LFT Recent Labs    10/30/17 0524  PROT 6.8  ALBUMIN 3.5  AST 135*  ALT 198*  ALKPHOS 347*  BILITOT 6.6*   PT/INR No results found for: INR, PROTIME Hepatitis Panel No results for input(s): HEPBSAG, HCVAB, HEPAIGM, HEPBIGM in the last 72 hours. C-Diff No components found for: CDIFF Lipase      Component Value Date/Time   LIPASE 39 10/30/2017 0524    Drugs of Abuse  No results found for: LABOPIA, COCAINSCRNUR, LABBENZ, AMPHETMU, THCU, LABBARB   RADIOLOGY STUDIES: Mr 3d Recon At Scanner  Result Date: 10/30/2017 CLINICAL DATA:  Right upper quadrant pain and abnormal LFTs. EXAM: MRI ABDOMEN WITHOUT AND WITH CONTRAST (INCLUDING MRCP) TECHNIQUE: Multiplanar multisequence MR imaging of the abdomen was performed both before and after the administration of intravenous contrast. Heavily T2-weighted images of the biliary and pancreatic ducts were obtained, and three-dimensional MRCP images were rendered by post processing. CONTRAST:  7 cc Gadavist COMPARISON:  Ultrasound exam from earlier the same day FINDINGS: Lower chest: Unremarkable. Hepatobiliary: No focal mass lesion within the hepatic parenchyma. Gallbladder is nondistended and there may be some small stones cords the gallbladder neck. No substantial pericholecystic fluid or edema. There is marked intra and extrahepatic biliary duct dilatation. Common duct measures up to 16 mm diameter. The common duct abruptly terminates as it enters the pancreatic head. Pancreas: The pancreatic duct in the body and tail the pancreas is markedly dilated irregular, measuring up to 10 mm diameter. Pancreatic parenchyma in the body and tail of the pancreas is almost completely atrophic. Pancreatic duct abruptly terminates in the pancreatic neck region. Although very subtle, there appears to be a 3 x 2.5 cm area of abnormal signal intensity in the head of the pancreas against which both the common bile duct and the pancreatic duct terminate. Spleen:  No splenomegaly. No focal mass lesion. Adrenals/Urinary Tract: No adrenal nodule or mass. Kidneys are unremarkable. Stomach/Bowel: Stomach is nondistended. No gastric wall thickening. No evidence of outlet obstruction. Duodenum is normally positioned as is the ligament of Treitz. No small bowel or colonic dilatation  within the visualized abdomen. Vascular/Lymphatic: No abdominal aortic  aneurysm. Portal vein is patent although there is some mass-effect I the process in the head of pancreas. 12 mm short axis portal caval lymph node generate some mass-effect on the IVC. Celiac axis and SMA are patent and fat signal around each vessel appears preserved. Small aortocaval lymph nodes are evident on postcontrast imaging. Other:  No intraperitoneal free fluid. Musculoskeletal: No abnormal marrow enhancement within the visualized bony anatomy. IMPRESSION: 1. Marked intra and extrahepatic biliary duct dilatation with abrupt cut off of the common bile duct at the pancreatic head. This is associated with marked dilatation of the main pancreatic duct also with an abrupt cut off at the level of the pancreatic head. Pancreatic parenchyma in the body and tail of pancreas is markedly atrophic. A subtle 2.5 x 3 cm lesion with differential signal intensity is identified in the head of the pancreas which appears slightly hypoenhancing on postcontrast imaging. Together, these features are highly concerning for pancreatic adenocarcinoma. EUS is recommended to further evaluate. 2. Portal vein is patent although there is some mass-effect on the portal splenic confluence and IVC. Fat planes around celiac axis and SMA appear preserved. 3. Upper normal to borderline portal caval lymph node associated with small right para-aortic lymphadenopathy. Electronically Signed   By: Misty Stanley M.D.   On: 10/30/2017 12:48   Mr Abdomen Mrcp Moise Boring Contast  Result Date: 10/30/2017 CLINICAL DATA:  Right upper quadrant pain and abnormal LFTs. EXAM: MRI ABDOMEN WITHOUT AND WITH CONTRAST (INCLUDING MRCP) TECHNIQUE: Multiplanar multisequence MR imaging of the abdomen was performed both before and after the administration of intravenous contrast. Heavily T2-weighted images of the biliary and pancreatic ducts were obtained, and three-dimensional MRCP images were  rendered by post processing. CONTRAST:  7 cc Gadavist COMPARISON:  Ultrasound exam from earlier the same day FINDINGS: Lower chest: Unremarkable. Hepatobiliary: No focal mass lesion within the hepatic parenchyma. Gallbladder is nondistended and there may be some small stones cords the gallbladder neck. No substantial pericholecystic fluid or edema. There is marked intra and extrahepatic biliary duct dilatation. Common duct measures up to 16 mm diameter. The common duct abruptly terminates as it enters the pancreatic head. Pancreas: The pancreatic duct in the body and tail the pancreas is markedly dilated irregular, measuring up to 10 mm diameter. Pancreatic parenchyma in the body and tail of the pancreas is almost completely atrophic. Pancreatic duct abruptly terminates in the pancreatic neck region. Although very subtle, there appears to be a 3 x 2.5 cm area of abnormal signal intensity in the head of the pancreas against which both the common bile duct and the pancreatic duct terminate. Spleen:  No splenomegaly. No focal mass lesion. Adrenals/Urinary Tract: No adrenal nodule or mass. Kidneys are unremarkable. Stomach/Bowel: Stomach is nondistended. No gastric wall thickening. No evidence of outlet obstruction. Duodenum is normally positioned as is the ligament of Treitz. No small bowel or colonic dilatation within the visualized abdomen. Vascular/Lymphatic: No abdominal aortic aneurysm. Portal vein is patent although there is some mass-effect I the process in the head of pancreas. 12 mm short axis portal caval lymph node generate some mass-effect on the IVC. Celiac axis and SMA are patent and fat signal around each vessel appears preserved. Small aortocaval lymph nodes are evident on postcontrast imaging. Other:  No intraperitoneal free fluid. Musculoskeletal: No abnormal marrow enhancement within the visualized bony anatomy. IMPRESSION: 1. Marked intra and extrahepatic biliary duct dilatation with abrupt cut off  of the common bile duct at the pancreatic head. This  is associated with marked dilatation of the main pancreatic duct also with an abrupt cut off at the level of the pancreatic head. Pancreatic parenchyma in the body and tail of pancreas is markedly atrophic. A subtle 2.5 x 3 cm lesion with differential signal intensity is identified in the head of the pancreas which appears slightly hypoenhancing on postcontrast imaging. Together, these features are highly concerning for pancreatic adenocarcinoma. EUS is recommended to further evaluate. 2. Portal vein is patent although there is some mass-effect on the portal splenic confluence and IVC. Fat planes around celiac axis and SMA appear preserved. 3. Upper normal to borderline portal caval lymph node associated with small right para-aortic lymphadenopathy. Electronically Signed   By: Misty Stanley M.D.   On: 10/30/2017 12:48   US Abdomen Limited Ruq  Result Date: 10/30/2017 CLINICAL DATA:  Right upper quadrant pain for 5 weeks EXAM: ULTRASOUND ABDOMEN LIMITED RIGHT UPPER QUADRANT COMPARISON:  None. FINDINGS: Gallbladder: Gallbladder is contracted and filled with multiple stones and sludge. Gallbladder wall is thickened at 4 mm. Murphy's sign is negative. Common bile duct: Diameter: Prominent dilatation at 17.6 mm. No filling defects identified although the distal bile duct is obscured by bowel gas. Liver: No focal lesion identified. Within normal limits in parenchymal echogenicity. Portal vein is patent on color Doppler imaging with normal direction of blood flow towards the liver. IMPRESSION: Contracted gallbladder filled with small stones and sludge. Gallbladder wall thickening. Changes may indicate cholecystitis in the appropriate clinical setting. Prominent dilatation of extrahepatic bile ducts. Consider obstruction versus choledochal cyst Electronically Signed   By: Lucienne Capers M.D.   On: 10/30/2017 06:31     IMPRESSION:   *    Obstructive jaundice.   Looks to have tumor at the head of the pancreas. Symptoms from this of pruritus.    PLAN:     *    ERCP with brushings and probable stenting tomorrow with Dr. Carlean Purl.  Set up for 10:00.  Will need EUS at a time in the future, possibly on Friday.  *   Ordered PT/INR and CA-19-9.  Azucena Freed  10/30/2017, 1:39 PM Phone [phone]

## 2017-10-30 NOTE — H&P (Addendum)
History and Physical    Jane Brooks VFI:[id number] DOB: [birth year] DOA: 10/30/2017  PCP: Patient, No Pcp Per - last saw a doctor years ago, maybe 3-4 Consultants:  None Patient coming from:  Home - lives alone, moved to [place] from [place] 2 years ago; NOK: None  Chief Complaint: Itching  HPI: Jane Brooks is a 47 y.o. female with no significant past medical history presenting with itching.  It all started about 3 weeks ago - she had RUQ pain that was very TTP, unable to lie on her stomach.  That pain resolved but she developed an itch.  She thought it was related to a friend's body wash, and she itched the next day.  The itch has not resolved.  The week after the pain, she also noticed that her urine was very dark, maybe with blood in it.  She doesn't eat/drink normally and so she thought this might be related to the dark urine.  She then started drinking more water but the urine continued to be very yellow and very dark.  She also noticed that her stool had turned gray.  She started googling her symptoms.  Her friend also noticed scleral icterus.  She decided that she has jaundice.  She decided to drink more water and take a bunch of benadryl.  It has been 3 weeks and she just couldn't take it anymore.  She was taking forks to the bottom of her feet to scratch them.  It is "one of those itches where it's not on the top of your skin, it's underneath your skin."  She hasn't slept in days.   ED Course: 5 weeks of RUQ pain, pruritis, scleral icterus.  Likely choledocholithiasis.  Needs MRCP, ERCP.  Did not consult surgery or GI.    Review of Systems: As per HPI; otherwise review of systems reviewed and negative.   Ambulatory Status:  Ambulates without assistance  Past Medical History:  Diagnosis Date  . Gallstones 10/2017  . Pneumothorax, closed, traumatic    years ago    Past Surgical History:  Procedure Laterality Date  . TONSILLECTOMY      Social History   Socioeconomic  History  . Marital status: Single    Spouse name: Not on file  . Number of children: Not on file  . Years of education: Not on file  . Highest education level: Not on file  Occupational History  . Occupation: unemployed  Social Needs  . Financial resource strain: Not on file  . Food insecurity:    Worry: Not on file    Inability: Not on file  . Transportation needs:    Medical: Not on file    Non-medical: Not on file  Tobacco Use  . Smoking status: Current Every Day Smoker    Packs/day: 1.00    Years: 37.00    Pack years: 37.00  . Smokeless tobacco: Never Used  Substance and Sexual Activity  . 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 Drug use: Never  . Sexual activity: Not on file  Lifestyle  . Physical activity:    Days per week: Not on file    Minutes per session: Not on file  . Stress: Not on file  Relationships  . Social connections:    Talks on phone: Not on file    Gets together: Not on file    Attends religious service: Not on file    Active member of club  or organization: Not on file    Attends meetings of clubs or organizations: Not on file    Relationship status: Not on file  . Intimate partner violence:    Fear of current or ex partner: Not on file    Emotionally abused: Not on file    Physically abused: Not on file    Forced sexual activity: Not on file  Other Topics Concern  . Not on file  Social History Narrative  . Not on file    Allergies  Allergen Reactions  . Latex Hives    History reviewed. No pertinent family history.  Prior to Admission medications   Medication Sig Start Date End Date Taking? Authorizing Provider  diphenhydrAMINE (BENADRYL) 25 MG tablet Take 25 mg by mouth every 6 (six) hours as needed for itching.   Yes [provider]    Physical Exam: Vitals:   10/30/17 0530 10/30/17 0600 10/30/17 0805 10/30/17 0949  BP: 123/79 119/80 122/79 114/77  Pulse: 70 74 69 69  Resp:   17  16  Temp:    97.7 F (36.5 C)  TempSrc:    Axillary  SpO2: 99% 100% 100% 100%  Weight:      Height:         General:  Appears calm and comfortable and is NAD Eyes:  PERRL, EOMI, normal lids, iris; + scleral icterus     ENT:  grossly normal hearing, lips & tongue, mmm; appropriate dentition Neck:  no LAD, masses or thyromegaly; no carotid bruits Cardiovascular:  RRR, no m/r/g. No LE edema.  Respiratory:   CTA bilaterally with no wheezes/rales/rhonchi.  Normal respiratory effort. Abdomen:  soft, tenderness along B LQ but particularly LUQ, ND, NABS Back:   normal alignment, no CVAT Skin: diffuse rash with excoriations particularly on buttocks, abdomen, chest     Musculoskeletal:  grossly normal tone BUE/BLE, good ROM, no bony abnormality Lower extremity:  No LE edema.  Limited foot exam with no ulcerations.  2+ distal pulses. Psychiatric:  grossly normal mood and affect, speech fluent and appropriate, AOx3 Neurologic:  CN 2-12 grossly intact, moves all extremities in coordinated fashion, sensation intact    Radiological Exams on Admission: Mr 3d Recon At Scanner  Result Date: 10/30/2017 CLINICAL DATA:  Right upper quadrant pain and abnormal LFTs. EXAM: MRI ABDOMEN WITHOUT AND WITH CONTRAST (INCLUDING MRCP) TECHNIQUE: Multiplanar multisequence MR imaging of the abdomen was performed both before and after the administration of intravenous contrast. Heavily T2-weighted images of the biliary and pancreatic ducts were obtained, and three-dimensional MRCP images were rendered by post processing. CONTRAST:  7 cc Gadavist COMPARISON:  Ultrasound exam from earlier the same day FINDINGS: Lower chest: Unremarkable. Hepatobiliary: No focal mass lesion within the hepatic parenchyma. Gallbladder is nondistended and there may be some small stones cords the gallbladder neck. No substantial pericholecystic fluid or edema. There is marked intra and extrahepatic biliary duct dilatation. Common duct  measures up to 16 mm diameter. The common duct abruptly terminates as it enters the pancreatic head. Pancreas: The pancreatic duct in the body and tail the pancreas is markedly dilated irregular, measuring up to 10 mm diameter. Pancreatic parenchyma in the body and tail of the pancreas is almost completely atrophic. Pancreatic duct abruptly terminates in the pancreatic neck region. Although very subtle, there appears to be a 3 x 2.5 cm area of abnormal signal intensity in the head of the pancreas against which both the common bile duct and the pancreatic duct terminate. Spleen:  No splenomegaly. No focal mass lesion. Adrenals/Urinary Tract: No adrenal nodule or mass. Kidneys are unremarkable. Stomach/Bowel: Stomach is nondistended. No gastric wall thickening. No evidence of outlet obstruction. Duodenum is normally positioned as is the ligament of Treitz. No small bowel or colonic dilatation within the visualized abdomen. Vascular/Lymphatic: No abdominal aortic aneurysm. Portal vein is patent although there is some mass-effect I the process in the head of pancreas. 12 mm short axis portal caval lymph node generate some mass-effect on the IVC. Celiac axis and SMA are patent and fat signal around each vessel appears preserved. Small aortocaval lymph nodes are evident on postcontrast imaging. Other:  No intraperitoneal free fluid. Musculoskeletal: No abnormal marrow enhancement within the visualized bony anatomy. IMPRESSION: 1. Marked intra and extrahepatic biliary duct dilatation with abrupt cut off of the common bile duct at the pancreatic head. This is associated with marked dilatation of the main pancreatic duct also with an abrupt cut off at the level of the pancreatic head. Pancreatic parenchyma in the body and tail of pancreas is markedly atrophic. A subtle 2.5 x 3 cm lesion with differential signal intensity is identified in the head of the pancreas which appears slightly hypoenhancing on postcontrast imaging.  Together, these features are highly concerning for pancreatic adenocarcinoma. EUS is recommended to further evaluate. 2. Portal vein is patent although there is some mass-effect on the portal splenic confluence and IVC. Fat planes around celiac axis and SMA appear preserved. 3. Upper normal to borderline portal caval lymph node associated with small right para-aortic lymphadenopathy. Electronically Signed   By: Misty Stanley M.D.   On: 10/30/2017 12:48   Mr Abdomen Mrcp Moise Boring Contast  Result Date: 10/30/2017 CLINICAL DATA:  Right upper quadrant pain and abnormal LFTs. EXAM: MRI ABDOMEN WITHOUT AND WITH CONTRAST (INCLUDING MRCP) TECHNIQUE: Multiplanar multisequence MR imaging of the abdomen was performed both before and after the administration of intravenous contrast. Heavily T2-weighted images of the biliary and pancreatic ducts were obtained, and three-dimensional MRCP images were rendered by post processing. CONTRAST:  7 cc Gadavist COMPARISON:  Ultrasound exam from earlier the same day FINDINGS: Lower chest: Unremarkable. Hepatobiliary: No focal mass lesion within the hepatic parenchyma. Gallbladder is nondistended and there may be some small stones cords the gallbladder neck. No substantial pericholecystic fluid or edema. There is marked intra and extrahepatic biliary duct dilatation. Common duct measures up to 16 mm diameter. The common duct abruptly terminates as it enters the pancreatic head. Pancreas: The pancreatic duct in the body and tail the pancreas is markedly dilated irregular, measuring up to 10 mm diameter. Pancreatic parenchyma in the body and tail of the pancreas is almost completely atrophic. Pancreatic duct abruptly terminates in the pancreatic neck region. Although very subtle, there appears to be a 3 x 2.5 cm area of abnormal signal intensity in the head of the pancreas against which both the common bile duct and the pancreatic duct terminate. Spleen:  No splenomegaly. No focal mass  lesion. Adrenals/Urinary Tract: No adrenal nodule or mass. Kidneys are unremarkable. Stomach/Bowel: Stomach is nondistended. No gastric wall thickening. No evidence of outlet obstruction. Duodenum is normally positioned as is the ligament of Treitz. No small bowel or colonic dilatation within the visualized abdomen. Vascular/Lymphatic: No abdominal aortic aneurysm. Portal vein is patent although there is some mass-effect I the process in the head of pancreas. 12 mm short axis portal caval lymph node generate some mass-effect on the IVC. Celiac axis and SMA are patent and fat signal  around each vessel appears preserved. Small aortocaval lymph nodes are evident on postcontrast imaging. Other:  No intraperitoneal free fluid. Musculoskeletal: No abnormal marrow enhancement within the visualized bony anatomy. IMPRESSION: 1. Marked intra and extrahepatic biliary duct dilatation with abrupt cut off of the common bile duct at the pancreatic head. This is associated with marked dilatation of the main pancreatic duct also with an abrupt cut off at the level of the pancreatic head. Pancreatic parenchyma in the body and tail of pancreas is markedly atrophic. A subtle 2.5 x 3 cm lesion with differential signal intensity is identified in the head of the pancreas which appears slightly hypoenhancing on postcontrast imaging. Together, these features are highly concerning for pancreatic adenocarcinoma. EUS is recommended to further evaluate. 2. Portal vein is patent although there is some mass-effect on the portal splenic confluence and IVC. Fat planes around celiac axis and SMA appear preserved. 3. Upper normal to borderline portal caval lymph node associated with small right para-aortic lymphadenopathy. Electronically Signed   By: Misty Stanley M.D.   On: 10/30/2017 12:48   US Abdomen Limited Ruq  Result Date: 10/30/2017 CLINICAL DATA:  Right upper quadrant pain for 5 weeks EXAM: ULTRASOUND ABDOMEN LIMITED RIGHT UPPER  QUADRANT COMPARISON:  None. FINDINGS: Gallbladder: Gallbladder is contracted and filled with multiple stones and sludge. Gallbladder wall is thickened at 4 mm. Murphy's sign is negative. Common bile duct: Diameter: Prominent dilatation at 17.6 mm. No filling defects identified although the distal bile duct is obscured by bowel gas. Liver: No focal lesion identified. Within normal limits in parenchymal echogenicity. Portal vein is patent on color Doppler imaging with normal direction of blood flow towards the liver. IMPRESSION: Contracted gallbladder filled with small stones and sludge. Gallbladder wall thickening. Changes may indicate cholecystitis in the appropriate clinical setting. Prominent dilatation of extrahepatic bile ducts. Consider obstruction versus choledochal cyst Electronically Signed   By: Lucienne Capers M.D.   On: 10/30/2017 06:31    EKG: not done   Labs on Admission: I have personally reviewed the available labs and imaging studies at the time of the admission.  Pertinent labs:   K+ 3.0 Glucose 106 AP 347 AST 135/ALT 198 Bili 6.6 WBC 6.7 Hgb 11.0 Tylenol, ETOH negative HCG negative  Assessment/Plan Principal Problem:   Jaundice Active Problems:   Cholecystitis   -Patient without routine medical care and no known medical problems presenting with 3 week onset of abdominal pain (RUQ) and worsening jaundice with severe pruritis -Labs confirm LFT elevation and hyperbilirubinemia -Initial RUQ appeared to indicate cholecystitis as the cause -Very unfortunately, MRCP appears to indicate probable pancreatic adenocarcinoma as the cause instead -Patient is admitted to Med Surg -While initial plan was for possible ERCP and then GI/surgery consult, she now needs GI consultation for probable EUS; I have spoken with PA Gribbin and they will see the patient -Currently NPO, but can likely start at least clear liquids if ok by GI -Pain control with morphine -Pruritis control with  hydroxyzine -Of note, she has a poor social situation without significant social support (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  Will request SW consult.   Tobacco Dependence: encourage cessation.  This was discussed with the patient and should be reviewed on an ongoing basis.  Patch ordered at patient request.    DVT prophylaxis: SCDs pending biopsy Code Status:  Full - confirmed with patient Family Communication: None present Disposition  Plan:  Home once clinically improved Consults called: GI; SW  Admission status: Admit - It is my clinical opinion that admission to INPATIENT is reasonable and necessary because of the expectation that this patient will require hospital care that crosses at least 2 midnights to treat this condition based on the medical complexity of the problems presented.  Given the aforementioned information, the predictability of an adverse outcome is felt to be significant.    Karmen Bongo MD [facility]  If note is complete, please contact covering daytime or nighttime physician. [email or web address] Password [email or web address]  10/30/2017, 1:32 PM

## 2017-10-30 NOTE — ED Notes (Signed)
Patient transported to US 

## 2017-10-30 NOTE — ED Provider Notes (Signed)
TIME SEEN: 4:52 AM  CHIEF COMPLAINT: Pruritus, scleral icterus  HPI: Patient is a 47 year old female with no significant past medical history who presents to the emergency department with complaints of pruritus for the past 3 weeks and scleral icterus for 2 weeks.  States symptoms started with right upper quadrant abdominal pain 3 to 4 weeks ago which lasted for several days and then resolved.  No fever.  Has had intermittent nausea and vomiting.  Does report that she takes Tylenol regularly for headaches but none in the past 24 hours.  Also reports that she has a history of binge alcohol use and states last time she had a binge was right before the symptoms started.  She does not drink alcohol every day.  No history of abdominal surgery.  No history of liver failure.  ROS: See HPI Constitutional: no fever  Eyes: no drainage  ENT: no runny nose   Cardiovascular:  no chest pain  Resp: no SOB  GI: no vomiting GU: no dysuria Integumentary: no rash  Allergy: no hives  Musculoskeletal: no leg swelling  Neurological: no slurred speech ROS otherwise negative  PAST MEDICAL HISTORY/PAST SURGICAL HISTORY:  History reviewed. No pertinent past medical history.  MEDICATIONS:  Prior to Admission medications   Not on File    ALLERGIES:  Allergies  Allergen Reactions  . Latex Hives    SOCIAL HISTORY:  Social History   Tobacco Use  . Smoking status: Current Every Day Smoker    Packs/day: 1.00  . Smokeless tobacco: Never Used  Substance Use Topics  . Alcohol use: Yes    FAMILY HISTORY: No family history on file.  EXAM: BP 126/85 (BP Location: Right Arm)   Pulse 94   Temp 97.8 F (36.6 C) (Oral)   Resp 19   Ht 4' 11"  (1.499 m)   Wt 57.6 kg   LMP 08/30/2017 Comment: irregular  SpO2 98%   BMI 25.65 kg/m  CONSTITUTIONAL: Alert and oriented and responds appropriately to questions. Well-appearing; well-nourished HEAD: Normocephalic EYES: She has scleral icterus bilaterally,  pupils appear equal, EOMI ENT: normal nose; moist mucous membranes NECK: Supple, no meningismus, no nuchal rigidity, no LAD  CARD: RRR; S1 and S2 appreciated; no murmurs, no clicks, no rubs, no gallops RESP: Normal chest excursion without splinting or tachypnea; breath sounds clear and equal bilaterally; no wheezes, no rhonchi, no rales, no hypoxia or respiratory distress, speaking full sentences ABD/GI: Normal bowel sounds; non-distended; soft, tender in the right upper quadrant, no rebound, no guarding, no peritoneal signs, no hepatosplenomegaly BACK:  The back appears normal and is non-tender to palpation, there is no CVA tenderness EXT: Normal ROM in all joints; non-tender to palpation; no edema; normal capillary refill; no cyanosis, no calf tenderness or swelling    SKIN: Normal color for age and race; warm; no rash, excoriations noted to her extremities from scratching NEURO: Moves all extremities equally PSYCH: The patient's mood and manner are appropriate. Grooming and personal hygiene are appropriate.  MEDICAL DECISION MAKING: Patient here with likely jaundice, scleral icterus.  I think that her pruritus is secondary to elevated bilirubin levels.  She has some tenderness in the right upper quadrant on examination.  Has history of heavy Tylenol and alcohol use.  Will check labs today, right upper quadrant ultrasound.  Will give Benadryl for symptomatic relief.  ED PROGRESS: Patient's LFTs are elevated.  Her white count is normal, lipase normal.  Ultrasound shows contracted gallbladder with stones and sludge with wall thickening.  Patient states that it may indicate cholecystitis but my clinical suspicion for acute infection is low.  I am actually more concerned that this is choledocholithiasis and that she will need MRCP versus ERCP.  Will discuss with medicine for admission and GI consult non-emergently.    7:32 AM Discussed patient's case with hospitalist, Dr. Lorin Mercy.  I have recommended  admission and patient (and family if present) agree with this plan. Admitting physician will place admission orders.   I reviewed all nursing notes, vitals, pertinent previous records, EKGs, lab and urine results, imaging (as available).     Elye Harmsen, Delice Bison, DO 10/30/17 [phone]

## 2017-10-30 NOTE — Progress Notes (Signed)
Received pt from ED. Patient alert oriented x4. Self ambulated to bed from wheelchair. Oriented to room/call bell. Will continue to monitor.

## 2017-10-31 ENCOUNTER — Telehealth: Payer: Self-pay

## 2017-10-31 ENCOUNTER — Other Ambulatory Visit: Payer: Self-pay

## 2017-10-31 ENCOUNTER — Encounter (HOSPITAL_COMMUNITY): Payer: Self-pay | Admitting: Certified Registered Nurse Anesthetist

## 2017-10-31 ENCOUNTER — Encounter (HOSPITAL_COMMUNITY)
Admission: EM | Disposition: A | Discharge status: Home / Self Care | Payer: Self-pay | Source: Home / Self Care | Attending: Internal Medicine

## 2017-10-31 ENCOUNTER — Inpatient Hospital Stay (HOSPITAL_COMMUNITY): Payer: Medicaid Other

## 2017-10-31 ENCOUNTER — Inpatient Hospital Stay (HOSPITAL_COMMUNITY): Payer: Medicaid Other | Admitting: Certified Registered Nurse Anesthetist

## 2017-10-31 DIAGNOSIS — K8689 Other specified diseases of pancreas: Secondary | ICD-10-CM

## 2017-10-31 DIAGNOSIS — Z720 Tobacco use: Secondary | ICD-10-CM

## 2017-10-31 DIAGNOSIS — F1010 Alcohol abuse, uncomplicated: Secondary | ICD-10-CM

## 2017-10-31 HISTORY — PX: BILIARY STENT PLACEMENT: SHX5538

## 2017-10-31 HISTORY — PX: SPHINCTEROTOMY: SHX5544

## 2017-10-31 HISTORY — PX: ERCP: SHX5425

## 2017-10-31 LAB — CBC
HCT: 30.1 % — ABNORMAL LOW (ref 36.0–46.0)
Hemoglobin: 9.8 g/dL — ABNORMAL LOW (ref 12.0–15.0)
MCH: 20.9 pg — ABNORMAL LOW (ref 26.0–34.0)
MCHC: 32.6 g/dL (ref 30.0–36.0)
MCV: 64.3 fL — AB (ref 80.0–100.0)
NRBC: 0 % (ref 0.0–0.2)
PLATELETS: 287 10*3/uL (ref 150–400)
RBC: 4.68 MIL/uL (ref 3.87–5.11)
RDW: 17.6 % — AB (ref 11.5–15.5)
WBC: 6 10*3/uL (ref 4.0–10.5)

## 2017-10-31 LAB — COMPREHENSIVE METABOLIC PANEL
ALK PHOS: 325 U/L — AB (ref 38–126)
ALT: 198 U/L — AB (ref 0–44)
AST: 151 U/L — ABNORMAL HIGH (ref 15–41)
Albumin: 3 g/dL — ABNORMAL LOW (ref 3.5–5.0)
Anion gap: 9 (ref 5–15)
BUN: 11 mg/dL (ref 6–20)
CALCIUM: 8.9 mg/dL (ref 8.9–10.3)
CO2: 25 mmol/L (ref 22–32)
CREATININE: 0.78 mg/dL (ref 0.44–1.00)
Chloride: 103 mmol/L (ref 98–111)
GFR calc non Af Amer: >60 mL/min (ref >60)
Glucose, Bld: 128 mg/dL — ABNORMAL HIGH (ref 70–99)
Potassium: 3.4 mmol/L — ABNORMAL LOW (ref 3.5–5.1)
Sodium: 137 mmol/L (ref 135–145)
Total Bilirubin: 6.3 mg/dL — ABNORMAL HIGH (ref 0.3–1.2)
Total Protein: 6.1 g/dL — ABNORMAL LOW (ref 6.5–8.1)

## 2017-10-31 LAB — CANCER ANTIGEN 19-9: CA 19-9: 140 U/mL — ABNORMAL HIGH (ref 0–35)

## 2017-10-31 LAB — HEPATITIS PANEL, ACUTE
HEP B C IGM: NEGATIVE
Hep A IgM: NEGATIVE
Hepatitis B Surface Ag: NEGATIVE

## 2017-10-31 LAB — HIV ANTIBODY (ROUTINE TESTING W REFLEX): HIV Screen 4th Generation wRfx: NONREACTIVE

## 2017-10-31 SURGERY — ERCP, WITH INTERVENTION IF INDICATED
Anesthesia: General

## 2017-10-31 MED ORDER — OXYCODONE HCL 5 MG PO CAPS: 5.0000 mg | ORAL_CAPSULE | Freq: Four times a day (QID) | ORAL | 0 refills | Status: AC | PRN

## 2017-10-31 MED ORDER — CIPROFLOXACIN IN D5W 400 MG/200ML IV SOLN
INTRAVENOUS | Status: AC
  Filled 2017-10-31: qty 200

## 2017-10-31 MED ORDER — NICOTINE 14 MG/24HR TD PT24: 14.0000 mg | MEDICATED_PATCH | Freq: Every day | TRANSDERMAL | 0 refills | Status: DC

## 2017-10-31 MED ORDER — INDOMETHACIN 50 MG RE SUPP
RECTAL | Status: AC
  Filled 2017-10-31: qty 2

## 2017-10-31 MED ORDER — GLUCAGON HCL RDNA (DIAGNOSTIC) 1 MG IJ SOLR
INTRAMUSCULAR | Status: AC
  Filled 2017-10-31: qty 1

## 2017-10-31 MED ORDER — FENTANYL CITRATE (PF) 100 MCG/2ML IJ SOLN
INTRAMUSCULAR | Status: AC
  Filled 2017-10-31: qty 2

## 2017-10-31 MED ORDER — MIDAZOLAM HCL 5 MG/5ML IJ SOLN
INTRAMUSCULAR | Status: DC | PRN
  Administered 2017-10-31: 2 mg via INTRAVENOUS

## 2017-10-31 MED ORDER — ONDANSETRON HCL 4 MG/2ML IJ SOLN
INTRAMUSCULAR | Status: DC | PRN
  Administered 2017-10-31: 4 mg via INTRAVENOUS

## 2017-10-31 MED ORDER — MIDAZOLAM HCL 2 MG/2ML IJ SOLN
INTRAMUSCULAR | Status: AC
  Filled 2017-10-31: qty 2

## 2017-10-31 MED ORDER — GLUCAGON HCL RDNA (DIAGNOSTIC) 1 MG IJ SOLR
INTRAMUSCULAR | Status: DC | PRN
  Administered 2017-10-31 (×4): .2 mg via INTRAVENOUS

## 2017-10-31 MED ORDER — IOPAMIDOL (ISOVUE-300) INJECTION 61%
INTRAVENOUS | Status: AC
  Filled 2017-10-31: qty 100

## 2017-10-31 MED ORDER — HYDROXYZINE HCL 25 MG PO TABS
50.0000 mg | ORAL_TABLET | Freq: Three times a day (TID) | ORAL | Status: DC | PRN
  Administered 2017-10-31: 50 mg via ORAL
  Filled 2017-10-31: qty 2

## 2017-10-31 MED ORDER — FENTANYL CITRATE (PF) 100 MCG/2ML IJ SOLN
INTRAMUSCULAR | Status: DC | PRN
  Administered 2017-10-31: 100 ug via INTRAVENOUS

## 2017-10-31 MED ORDER — INDOMETHACIN 50 MG RE SUPP
RECTAL | Status: DC | PRN
  Administered 2017-10-31: 100 mg via RECTAL

## 2017-10-31 MED ORDER — DEXAMETHASONE SODIUM PHOSPHATE 10 MG/ML IJ SOLN
INTRAMUSCULAR | Status: DC | PRN
  Administered 2017-10-31: 10 mg via INTRAVENOUS

## 2017-10-31 MED ORDER — SUGAMMADEX SODIUM 200 MG/2ML IV SOLN
INTRAVENOUS | Status: DC | PRN
  Administered 2017-10-31: 125 mg via INTRAVENOUS

## 2017-10-31 MED ORDER — PROPOFOL 10 MG/ML IV BOLUS
INTRAVENOUS | Status: DC | PRN
  Administered 2017-10-31: 50 mg via INTRAVENOUS
  Administered 2017-10-31: 100 mg via INTRAVENOUS

## 2017-10-31 MED ORDER — ROCURONIUM BROMIDE 50 MG/5ML IV SOSY
PREFILLED_SYRINGE | INTRAVENOUS | Status: DC | PRN
  Administered 2017-10-31: 50 mg via INTRAVENOUS

## 2017-10-31 MED ORDER — SODIUM CHLORIDE 0.9 % IV SOLN
INTRAVENOUS | Status: DC | PRN
  Administered 2017-10-31: 30 mL

## 2017-10-31 MED ORDER — CAMPHOR-MENTHOL 0.5-0.5 % EX LOTN: TOPICAL_LOTION | CUTANEOUS | 0 refills | Status: DC | PRN

## 2017-10-31 MED ORDER — CIPROFLOXACIN IN D5W 400 MG/200ML IV SOLN
INTRAVENOUS | Status: DC | PRN
  Administered 2017-10-31: 400 mg via INTRAVENOUS

## 2017-10-31 MED ORDER — LIDOCAINE 2% (20 MG/ML) 5 ML SYRINGE
INTRAMUSCULAR | Status: DC | PRN
  Administered 2017-10-31: 60 mg via INTRAVENOUS

## 2017-10-31 MED ORDER — HYDROXYZINE HCL 50 MG PO TABS: 50.0000 mg | ORAL_TABLET | Freq: Three times a day (TID) | ORAL | 0 refills | Status: DC | PRN

## 2017-10-31 NOTE — Telephone Encounter (Signed)
Juluis Rainier Sarah:  10/24 EUS with Dr Ardis Hughs, instructions mailed to the pt home.  She is currently hospitalized and will be given the appt at discharge as well.

## 2017-10-31 NOTE — Telephone Encounter (Signed)
-----   Message from Irving Copas., MD sent at 10/31/2017 12:25 PM EDT ----- Regarding: RE: set up EUS Hey All, I'm in the hospital next week. I looked at my schedule the next week and I'm going to be full as well and away the end of the week and my next Monday. Trinity Hyland, if Linna Hoff has availability in the next 2 weeks that may be reasonable.   However, if no time, then let's plan for first procedure on Tuesday vs Thursday and only Friday if nothing else available. Thanks. Gabe ----- Message ----- From: Clearence Cheek Sent: 10/31/2017  12:21 PM EDT To: Timothy Lasso, RN, Jerene Bears, MD, # Subject: set up EUS                                     Dr Lyndel Safe wants pt set up for EUS.  She likeley will discharge home today 10/16.  Had ERCP today. Thank you.    S

## 2017-10-31 NOTE — Interval H&P Note (Signed)
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 After consideration of risks, benefits and other options for treatment, the patient has consented to  Procedure(s): ENDOSCOPIC RETROGRADE CHOLANGIOPANCREATOGRAPHY (ERCP) (N/A) as a surgical intervention .  The patient's history has been reviewed, patient examined, no change in status, stable for surgery.  I have reviewed the patient's chart and labs.  Questions were answered to the patient's satisfaction.     [name]

## 2017-10-31 NOTE — Anesthesia Procedure Notes (Signed)
Procedure Name: Intubation Date/Time: 10/31/2017 10:30 AM Performed by: Candis Shine, CRNA Pre-anesthesia Checklist: Patient identified, Emergency Drugs available, Suction available and Patient being monitored Patient Re-evaluated:Patient Re-evaluated prior to induction Oxygen Delivery Method: Circle System Utilized Preoxygenation: Pre-oxygenation with 100% oxygen Induction Type: IV induction Ventilation: Mask ventilation without difficulty Laryngoscope Size: Mac and 3 Grade View: Grade I Tube type: Oral Tube size: 7.0 mm Number of attempts: 1 Airway Equipment and Method: Stylet Placement Confirmation: ETT inserted through vocal cords under direct vision,  positive ETCO2 and breath sounds checked- equal and bilateral Secured at: 22 cm Tube secured with: Tape Dental Injury: Teeth and Oropharynx as per pre-operative assessment

## 2017-10-31 NOTE — Transfer of Care (Signed)
Immediate Anesthesia Transfer of Care Note  Patient: Jane Brooks  Procedure(s) Performed: ENDOSCOPIC RETROGRADE CHOLANGIOPANCREATOGRAPHY (ERCP) (N/A )  Patient Location: Endoscopy Unit  Anesthesia Type:General  Level of Consciousness: awake, alert  and oriented  Airway & Oxygen Therapy: Patient Spontanous Breathing  Post-op Assessment: Report given to RN and Post -op Vital signs reviewed and stable  Post vital signs: Reviewed and stable  Last Vitals:  Vitals Value Taken Time  BP 169/98 10/31/2017 11:34 AM  Temp    Pulse 81 10/31/2017 11:35 AM  Resp 17 10/31/2017 11:35 AM  SpO2 99 % 10/31/2017 11:35 AM  Vitals shown include unvalidated device data.  Last Pain:  Vitals:   10/31/17 1134  TempSrc:   PainSc: 0-No pain         Complications: No apparent anesthesia complications

## 2017-10-31 NOTE — Op Note (Signed)
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 Estimated Blood Loss:     Estimated blood loss: none. Procedure:                Pre-Anesthesia Assessment:                           - Prior to the procedure, a History and Physical                            was performed, and patient medications and                            allergies were reviewed. The patient's tolerance of                            previous anesthesia was also reviewed. The risks                            and benefits of the procedure and the sedation                            options and risks were discussed with the patient.                            All questions were answered, and informed consent                            was obtained. Prior Anticoagulants: The patient has                            taken no previous anticoagulant or antiplatelet                            agents. ASA Grade Assessment: II - A patient with                            mild systemic disease. After reviewing the risks  and benefits, the patient was deemed in                            satisfactory condition to undergo the procedure.                           After obtaining informed consent, the scope was                            passed under direct vision. Throughout the                            procedure, the patient's blood pressure, pulse, and                            oxygen  saturations were monitored continuously. The                            [id number]-[id number] ([id number] Olympus Duodensocope was                            introduced through the mouth, and used to inject                            contrast into and used to inject contrast into the                            bile duct. The ERCP was accomplished without                            difficulty. The patient tolerated the procedure                            well. Scope In: Scope Out: Findings:      The scout film was normal. The major papilla was normal. The bile duct       was deeply cannulated with the short-nosed traction sphincterotome.       Contrast was injected. I personally interpreted the bile duct images.       Tight malignant stricture 12 mm      in lower third of the main bile duct with upstream biliary dilatation of       CBD, CHD, IHBD ducts.The largest diameter was 18 mm. Limited biliary       sphincterotomy was performed using ERBE Endocut. One 8.5 Fr by 7 cm       plastic stent was placed into the common bile duct. Bile and clear fluid       flowed through the stent. The stent was in good position. Pancreatogram       not obtained intentionally. 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 Recommendation:           -Indocin suppostories.                           -Monitor liver function tests.                           -Observe for any immediate complications.                           -EUS as an outpatient followed by surgery/oncology                            consultation. Procedure Code(s):        --- Professional ---                           [phone], Endoscopic retrograde                            cholangiopancreatography (ERCP); with placement of                            endoscopic stent into biliary or pancreatic duct,                             including pre- and post-dilation and guide wire                            passage, when performed, including sphincterotomy,                            when performed, each stent                           [id number], Endoscopic catheterization of the biliary                            ductal system, radiological supervision and                            interpretation Diagnosis Code(s):        --- Professional ---                           K83.1, Obstruction of bile duct                           R17, Unspecified jaundice CPT copyright 2018 American [employer] Association. All rights reserved. The codes documented in this report are preliminary and upon coder review may  be revised to meet current compliance requirements. [name], MD 10/31/2017 11:51:03 AM This report has been signed electronically. Number of Addenda: 0

## 2017-10-31 NOTE — Anesthesia Postprocedure Evaluation (Signed)
Anesthesia Post Note  Patient: Ravina Milner  Procedure(s) Performed: ENDOSCOPIC RETROGRADE CHOLANGIOPANCREATOGRAPHY (ERCP) (N/A ) [place]     Patient location during evaluation: PACU Anesthesia Type: General Level of consciousness: awake and alert Pain management: pain level controlled Vital Signs Assessment: post-procedure vital signs reviewed and stable Respiratory status: spontaneous breathing, nonlabored ventilation, respiratory function stable and patient connected to nasal cannula oxygen Cardiovascular status: blood pressure returned to baseline and stable Postop Assessment: no apparent nausea or vomiting Anesthetic complications: no    Last Vitals:  Vitals:   10/31/17 1155 10/31/17 1205  BP: (!) 170/69 (!) 162/57  Pulse: 86 73  Resp: 19 (!) 22  Temp:    SpO2: 95% 100%    Last Pain:  Vitals:   10/31/17 1134  TempSrc:   PainSc: 0-No pain                 Montez Hageman

## 2017-10-31 NOTE — Discharge Summary (Signed)
Discharge Summary  [name] HAL:[id number] DOB: 01/22/1970  PCP: Patient, No Pcp Per  Admit date: 10/30/2017 Discharge date: 10/31/2017  Time spent: 35 mins  Recommendations for Outpatient Follow-up:  1. GI to follow up on 11/08/17 2. PCP to establish care  Discharge Diagnoses:  Active Hospital Problems   Diagnosis Date Noted  . Jaundice 10/30/2017  . Cholecystitis 10/30/2017    Resolved Hospital Problems  No resolved problems to display.    Discharge Condition: Stable   Diet recommendation: Regular  Vitals:   10/31/17 1205 10/31/17 1444  BP: (!) 162/57 (!) 145/79  Pulse: 73 82  Resp: (!) 22 18  Temp:  98.4 F (36.9 C)  SpO2: 100% 100%    History of present illness:  Jane Brooks is a 47 y.o. female with no significant past medical history presenting with generalized itching, ongoing for about 3 weeks with associated painless jaundice, dark urine and pale stools. Pt admitted for further management.  Today, pt reported itching has resolved some, reports some RUQ abdominal pain s/p ERCP. Denies any N/V, chest pain, SOB. Pt very eager to be discharged as she is tired of staying in the hospital and would feel much comfortable at home. GI also ok with patient being discharged with close follow up. Pt denies any SI or HI but does report feeling low about diagnosis. Encouraged pt to establish care with PCP and keep appointment with GI.  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65 kg/m   General: NAD, jaundiced Cardiovascular: S1, S2 present  Respiratory: CTAB  Discharge Instructions You were cared for by a hospitalist during your hospital stay. If you have any questions about your discharge medications or the care you received while you were in the hospital after you are discharged, you can call the unit and asked to speak with the hospitalist on call if the hospitalist that took care of you is not available. Once you are discharged, your primary care physician will handle any further medical issues. Please note that NO REFILLS for any discharge medications will be authorized once you are discharged, as it is imperative that you return to your primary care physician (or establish a relationship with a primary care physician if you do not have one) for your aftercare needs so that they can reassess your need for medications and monitor your lab values.  Allergies as of 10/31/2017      Reactions   Latex Hives      Medication List    STOP taking these medications   diphenhydrAMINE 25 MG tablet Commonly known as:  BENADRYL     TAKE these medications   camphor-menthol lotion Commonly known as:  SARNA Apply topically as needed for itching.   hydrOXYzine 50 MG tablet Commonly known as:  ATARAX/VISTARIL Take 1 tablet (50 mg total) by  mouth 3 (three) times daily as needed.   nicotine 14 mg/24hr patch Commonly known as:  NICODERM CQ - dosed in mg/24 hours Place 1 patch (14 mg total) onto the skin daily. Start taking on:  11/01/2017   oxycodone 5 MG capsule Commonly known as:  OXY-IR Take 1 capsule (5 mg total) by mouth every 6 (six) hours as needed for up to 3 days.      Allergies  Allergen Reactions  . Latex Hives   Follow-up Information    Milus Banister, MD. Go on 11/08/2017.   Specialty:  Gastroenterology Why:  For EUS Contact information: [address] [address] [phone]        [facility]. Schedule an appointment as soon as possible for a visit in 1 week(s).   Why:  To establish care with primary care provider and for follow up. They accept patients without insurance Contact information: [place] [address] [phone]           The results of significant diagnostics from this hospitalization (including imaging, microbiology, ancillary and laboratory) are listed below for reference.    Significant Diagnostic Studies: Mr 3d Recon At Scanner  Result Date: 10/30/2017 CLINICAL DATA:  Right upper quadrant pain and abnormal LFTs. EXAM: MRI ABDOMEN WITHOUT AND WITH CONTRAST (INCLUDING MRCP) TECHNIQUE: Multiplanar multisequence MR imaging of the abdomen was performed both before and after the administration of intravenous contrast. Heavily T2-weighted images of the biliary and pancreatic ducts were obtained, and three-dimensional MRCP images were rendered by post processing. CONTRAST:  7 cc Gadavist COMPARISON:  Ultrasound exam from earlier the same day FINDINGS: Lower chest: Unremarkable. Hepatobiliary: No focal mass lesion within the hepatic parenchyma. Gallbladder is nondistended and there may be some small stones cords the gallbladder neck. No substantial pericholecystic fluid or edema. There is marked intra and  extrahepatic biliary duct dilatation. Common duct measures up to 16 mm diameter. The common duct abruptly terminates as it enters the pancreatic head. Pancreas: The pancreatic duct in the body and tail the pancreas is markedly dilated irregular, measuring up to 10 mm diameter. Pancreatic parenchyma in the body and tail of the pancreas is almost completely atrophic. Pancreatic duct abruptly terminates in the pancreatic neck region. Although very subtle, there appears to be a 3 x 2.5 cm area of abnormal signal intensity in the head of the pancreas against which both the common bile duct and the pancreatic duct terminate. Spleen:  No splenomegaly. No focal mass lesion. Adrenals/Urinary Tract: No adrenal nodule or mass. Kidneys are unremarkable. Stomach/Bowel: Stomach is nondistended. No gastric wall thickening. No evidence of outlet obstruction. Duodenum is normally positioned as is the ligament of Treitz. No small bowel or colonic dilatation within the visualized abdomen. Vascular/Lymphatic: No abdominal aortic aneurysm. Portal vein is patent although there is some mass-effect I the process in the head of pancreas. 12 mm short axis portal caval lymph node generate some mass-effect on the IVC. Celiac axis and SMA  are patent and fat signal around each vessel appears preserved. Small aortocaval lymph nodes are evident on postcontrast imaging. Other:  No intraperitoneal free fluid. Musculoskeletal: No abnormal marrow enhancement within the visualized bony anatomy. IMPRESSION: 1. Marked intra and extrahepatic biliary duct dilatation with abrupt cut off of the common bile duct at the pancreatic head. This is associated with marked dilatation of the main pancreatic duct also with an abrupt cut off at the level of the pancreatic head. Pancreatic parenchyma in the body and tail of pancreas is markedly atrophic. A subtle 2.5 x 3 cm lesion with differential signal intensity is identified in the head of the pancreas which appears  slightly hypoenhancing on postcontrast imaging. Together, these features are highly concerning for pancreatic adenocarcinoma. EUS is recommended to further evaluate. 2. Portal vein is patent although there is some mass-effect on the portal splenic confluence and IVC. Fat planes around celiac axis and SMA appear preserved. 3. Upper normal to borderline portal caval lymph node associated with small right para-aortic lymphadenopathy. Electronically Signed   By: Misty Stanley M.D.   On: 10/30/2017 12:48   Dg Ercp Biliary & Pancreatic Ducts  Result Date: 10/31/2017 CLINICAL DATA:  ERCP with sphincterotomy and biliary stent placement EXAM: ERCP TECHNIQUE: Multiple spot images obtained with the fluoroscopic device and submitted for interpretation post-procedure. COMPARISON:  MRCP-10/30/2017 FLUOROSCOPY TIME:  2 minutes, 53 seconds FINDINGS: 6 spot fluoroscopic images of the right upper abdominal quadrant during ERCP are provided for review Initial image demonstrates an ERCP probe overlying the right upper abdominal quadrant. Subsequent images demonstrate selective cannulation and opacification of the common bile duct. There is an abrupt narrowing of the distal aspect of the CBD with apparent shouldering (images 3 and 4) Completion image demonstrates placement of a stent traversing this suspected location of CBD narrowing. IMPRESSION: ERCP with biliary stent placement as above. These images were submitted for radiologic interpretation only. Please see the procedural report for the amount of contrast and the fluoroscopy time utilized. Electronically Signed   By: Sandi Mariscal M.D.   On: 10/31/2017 12:23   Mr Abdomen Mrcp Moise Boring Contast  Result Date: 10/30/2017 CLINICAL DATA:  Right upper quadrant pain and abnormal LFTs. EXAM: MRI ABDOMEN WITHOUT AND WITH CONTRAST (INCLUDING MRCP) TECHNIQUE: Multiplanar multisequence MR imaging of the abdomen was performed both before and after the administration of intravenous  contrast. Heavily T2-weighted images of the biliary and pancreatic ducts were obtained, and three-dimensional MRCP images were rendered by post processing. CONTRAST:  7 cc Gadavist COMPARISON:  Ultrasound exam from earlier the same day FINDINGS: Lower chest: Unremarkable. Hepatobiliary: No focal mass lesion within the hepatic parenchyma. Gallbladder is nondistended and there may be some small stones cords the gallbladder neck. No substantial pericholecystic fluid or edema. There is marked intra and extrahepatic biliary duct dilatation. Common duct measures up to 16 mm diameter. The common duct abruptly terminates as it enters the pancreatic head. Pancreas: The pancreatic duct in the body and tail the pancreas is markedly dilated irregular, measuring up to 10 mm diameter. Pancreatic parenchyma in the body and tail of the pancreas is almost completely atrophic. Pancreatic duct abruptly terminates in the pancreatic neck region. Although very subtle, there appears to be a 3 x 2.5 cm area of abnormal signal intensity in the head of the pancreas against which both the common bile duct and the pancreatic duct terminate. Spleen:  No splenomegaly. No focal mass lesion. Adrenals/Urinary Tract: No adrenal nodule or mass. Kidneys are unremarkable.  Stomach/Bowel: Stomach is nondistended. No gastric wall thickening. No evidence of outlet obstruction. Duodenum is normally positioned as is the ligament of Treitz. No small bowel or colonic dilatation within the visualized abdomen. Vascular/Lymphatic: No abdominal aortic aneurysm. Portal vein is patent although there is some mass-effect I the process in the head of pancreas. 12 mm short axis portal caval lymph node generate some mass-effect on the IVC. Celiac axis and SMA are patent and fat signal around each vessel appears preserved. Small aortocaval lymph nodes are evident on postcontrast imaging. Other:  No intraperitoneal free fluid. Musculoskeletal: No abnormal marrow  enhancement within the visualized bony anatomy. IMPRESSION: 1. Marked intra and extrahepatic biliary duct dilatation with abrupt cut off of the common bile duct at the pancreatic head. This is associated with marked dilatation of the main pancreatic duct also with an abrupt cut off at the level of the pancreatic head. Pancreatic parenchyma in the body and tail of pancreas is markedly atrophic. A subtle 2.5 x 3 cm lesion with differential signal intensity is identified in the head of the pancreas which appears slightly hypoenhancing on postcontrast imaging. Together, these features are highly concerning for pancreatic adenocarcinoma. EUS is recommended to further evaluate. 2. Portal vein is patent although there is some mass-effect on the portal splenic confluence and IVC. Fat planes around celiac axis and SMA appear preserved. 3. Upper normal to borderline portal caval lymph node associated with small right para-aortic lymphadenopathy. Electronically Signed   By: Misty Stanley M.D.   On: 10/30/2017 12:48   US Abdomen Limited Ruq  Result Date: 10/30/2017 CLINICAL DATA:  Right upper quadrant pain for 5 weeks EXAM: ULTRASOUND ABDOMEN LIMITED RIGHT UPPER QUADRANT COMPARISON:  None. FINDINGS: Gallbladder: Gallbladder is contracted and filled with multiple stones and sludge. Gallbladder wall is thickened at 4 mm. Murphy's sign is negative. Common bile duct: Diameter: Prominent dilatation at 17.6 mm. No filling defects identified although the distal bile duct is obscured by bowel gas. Liver: No focal lesion identified. Within normal limits in parenchymal echogenicity. Portal vein is patent on color Doppler imaging with normal direction of blood flow towards the liver. IMPRESSION: Contracted gallbladder filled with small stones and sludge. Gallbladder wall thickening. Changes may indicate cholecystitis in the appropriate clinical setting. Prominent dilatation of extrahepatic bile ducts. Consider obstruction versus  choledochal cyst Electronically Signed   By: Lucienne Capers M.D.   On: 10/30/2017 06:31    Microbiology: No results found for this or any previous visit (from the past 240 hour(s)).   Labs: Basic Metabolic Panel: Recent Labs  Lab 10/30/17 0524 10/31/17 0145  NA 135 137  K 3.0* 3.4*  CL 103 103  CO2 22 25  GLUCOSE 106* 128*  BUN 8 11  CREATININE 0.64 0.78  CALCIUM 9.1 8.9   Liver Function Tests: Recent Labs  Lab 10/30/17 0524 10/31/17 0145  AST 135* 151*  ALT 198* 198*  ALKPHOS 347* 325*  BILITOT 6.6* 6.3*  PROT 6.8 6.1*  ALBUMIN 3.5 3.0*   Recent Labs  Lab 10/30/17 0524  LIPASE 39   No results for input(s): AMMONIA in the last 168 hours. CBC: Recent Labs  Lab 10/30/17 0524 10/31/17 0145  WBC 6.7 6.0  NEUTROABS 4.8  --   HGB 11.0* 9.8*  HCT 33.2* 30.1*  MCV 64.3* 64.3*  PLT 341 287   Cardiac Enzymes: No results for input(s): CKTOTAL, CKMB, CKMBINDEX, TROPONINI in the last 168 hours. BNP: BNP (last 3 results) No results for input(s): BNP  in the last 8760 hours.  ProBNP (last 3 results) No results for input(s): PROBNP in the last 8760 hours.  CBG: No results for input(s): GLUCAP in the last 168 hours.     Signed:  Alma Friendly, MD [facility] 10/31/2017, 5:09 PM

## 2017-10-31 NOTE — Social Work (Signed)
CSW acknowledging consult for assistance with Medicaid enrollment.  Referred pt to Shanon Rosser, financial counselor to assist pt with Medicaid eligibility screening and information regarding [place] assistance programs.  Westley Hummer, MSW, [place] Work [phone]

## 2017-10-31 NOTE — Progress Notes (Signed)
Patient given discharge instructions. Patient verbalized understanding of instructions and follow up appointment. Patient left unit in stable condition.

## 2017-10-31 NOTE — Telephone Encounter (Signed)
-----   Message from Milus Banister, MD sent at 10/31/2017  1:38 PM EDT ----- Regarding: RE: set up EUS Mionna Advincula, Please put her on for upper EUS radial +/- linear October 24th for pancreatic mass.  I should still have one spot available to follow my current cases.   Merrie Roof, In the future it would be helpful to get brushings of the stricture if it is possible. Admittedly biliary brushing is often/usually negative but if it is positive it may preclude the need for further invasive testing with EUS.    Thanks  DJ  ----- Message ----- From: Irving Copas., MD Sent: 10/31/2017  12:25 PM EDT To: Milus Banister, MD, Vena Rua, PA-C, # Subject: RE: set up EUS                                 Hey All, I'm in the hospital next week. I looked at my schedule the next week and I'm going to be full as well and away the end of the week and my next Monday. Tymir Terral, if Linna Hoff has availability in the next 2 weeks that may be reasonable.   However, if no time, then let's plan for first procedure on Tuesday vs Thursday and only Friday if nothing else available. Thanks. Gabe ----- Message ----- From: Clearence Cheek Sent: 10/31/2017  12:21 PM EDT To: Timothy Lasso, RN, Jerene Bears, MD, # Subject: set up EUS                                     Dr Lyndel Safe wants pt set up for EUS.  She likeley will discharge home today 10/16.  Had ERCP today. Thank you.    S

## 2017-10-31 NOTE — Anesthesia Preprocedure Evaluation (Addendum)
Anesthesia Evaluation  Patient identified by MRN, date of birth, ID band Patient awake    Reviewed: Allergy & Precautions, NPO status , Patient's Chart, lab work & pertinent test results  Airway Mallampati: II  TM Distance: >3 FB Neck ROM: Full    Dental no notable dental hx.    Pulmonary neg pulmonary ROS, Current Smoker,    Pulmonary exam normal breath sounds clear to auscultation       Cardiovascular negative cardio ROS Normal cardiovascular exam Rhythm:Regular Rate:Normal     Neuro/Psych negative neurological ROS  negative psych ROS   GI/Hepatic negative GI ROS, Neg liver ROS,   Endo/Other  negative endocrine ROS  Renal/GU negative Renal ROS  negative genitourinary   Musculoskeletal negative musculoskeletal ROS (+)   Abdominal   Peds negative pediatric ROS (+)  Hematology negative hematology ROS (+)   Anesthesia Other Findings   Reproductive/Obstetrics negative OB ROS                            Anesthesia Physical Anesthesia Plan  ASA: II  Anesthesia Plan: General   Post-op Pain Management:    Induction: Intravenous  PONV Risk Score and Plan: 2 and Ondansetron and Treatment may vary due to age or medical condition  Airway Management Planned: Oral ETT  Additional Equipment:   Intra-op Plan:   Post-operative Plan: Extubation in OR  Informed Consent: I have reviewed the patients History and Physical, chart, labs and discussed the procedure including the risks, benefits and alternatives for the proposed anesthesia with the patient or authorized representative who has indicated his/her understanding and acceptance.   Dental advisory given  Plan Discussed with: CRNA  Anesthesia Plan Comments:         Anesthesia Quick Evaluation

## 2017-11-01 ENCOUNTER — Encounter (HOSPITAL_COMMUNITY): Payer: Self-pay | Admitting: Gastroenterology

## 2017-11-03 ENCOUNTER — Encounter: Payer: Self-pay | Admitting: Emergency Medicine

## 2017-11-03 ENCOUNTER — Emergency Department (HOSPITAL_COMMUNITY)
Admission: EM | Admit: 2017-11-03 | Discharge: 2017-11-03 | Disposition: A | Discharge status: Home / Self Care | Payer: Medicaid Other | Attending: Emergency Medicine | Admitting: Emergency Medicine

## 2017-11-03 DIAGNOSIS — C259 Malignant neoplasm of pancreas, unspecified: Secondary | ICD-10-CM

## 2017-11-03 DIAGNOSIS — F17200 Nicotine dependence, unspecified, uncomplicated: Secondary | ICD-10-CM | POA: Insufficient documentation

## 2017-11-03 DIAGNOSIS — R1011 Right upper quadrant pain: Secondary | ICD-10-CM | POA: Insufficient documentation

## 2017-11-03 DIAGNOSIS — Z91040 Latex allergy status: Secondary | ICD-10-CM | POA: Diagnosis not present

## 2017-11-03 LAB — URINALYSIS, ROUTINE W REFLEX MICROSCOPIC
Bilirubin Urine: NEGATIVE
Glucose, UA: NEGATIVE mg/dL
HGB URINE DIPSTICK: NEGATIVE
Ketones, ur: NEGATIVE mg/dL
LEUKOCYTES UA: NEGATIVE
Nitrite: NEGATIVE
PROTEIN: NEGATIVE mg/dL
Specific Gravity, Urine: 1.006 (ref 1.005–1.030)
pH: 7 (ref 5.0–8.0)

## 2017-11-03 LAB — CBC
HEMATOCRIT: 33.2 % — AB (ref 36.0–46.0)
Hemoglobin: 10.4 g/dL — ABNORMAL LOW (ref 12.0–15.0)
MCH: 20.9 pg — AB (ref 26.0–34.0)
MCHC: 31.3 g/dL (ref 30.0–36.0)
MCV: 66.7 fL — ABNORMAL LOW (ref 80.0–100.0)
NRBC: 0 % (ref 0.0–0.2)
PLATELETS: 424 10*3/uL — AB (ref 150–400)
RBC: 4.98 MIL/uL (ref 3.87–5.11)
RDW: 16.6 % — ABNORMAL HIGH (ref 11.5–15.5)
WBC: 9.1 10*3/uL (ref 4.0–10.5)

## 2017-11-03 LAB — COMPREHENSIVE METABOLIC PANEL
ALK PHOS: 231 U/L — AB (ref 38–126)
ALT: 183 U/L — AB (ref 0–44)
AST: 97 U/L — ABNORMAL HIGH (ref 15–41)
Albumin: 3.4 g/dL — ABNORMAL LOW (ref 3.5–5.0)
Anion gap: 10 (ref 5–15)
BUN: 8 mg/dL (ref 6–20)
CALCIUM: 9.2 mg/dL (ref 8.9–10.3)
CHLORIDE: 101 mmol/L (ref 98–111)
CO2: 27 mmol/L (ref 22–32)
CREATININE: 0.6 mg/dL (ref 0.44–1.00)
GFR calc Af Amer: >60 mL/min (ref >60)
GFR calc non Af Amer: >60 mL/min (ref >60)
GLUCOSE: 106 mg/dL — AB (ref 70–99)
Potassium: 3.5 mmol/L (ref 3.5–5.1)
SODIUM: 138 mmol/L (ref 135–145)
Total Bilirubin: 3 mg/dL — ABNORMAL HIGH (ref 0.3–1.2)
Total Protein: 6.9 g/dL (ref 6.5–8.1)

## 2017-11-03 LAB — LIPASE, BLOOD: LIPASE: 54 U/L — AB (ref 11–51)

## 2017-11-03 LAB — I-STAT BETA HCG BLOOD, ED (MC, WL, AP ONLY): I-stat hCG, quantitative: <5 m[IU]/mL (ref <5)

## 2017-11-03 MED ORDER — LACTATED RINGERS IV BOLUS
1000.0000 mL | Freq: Once | INTRAVENOUS | Status: AC
  Administered 2017-11-03: 1000 mL via INTRAVENOUS

## 2017-11-03 MED ORDER — MORPHINE SULFATE (PF) 4 MG/ML IV SOLN
4.0000 mg | Freq: Once | INTRAVENOUS | Status: AC
  Administered 2017-11-03: 4 mg via INTRAVENOUS
  Filled 2017-11-03: qty 1

## 2017-11-03 MED ORDER — DOCUSATE SODIUM 250 MG PO CAPS: 250.0000 mg | ORAL_CAPSULE | Freq: Every day | ORAL | 0 refills | Status: AC

## 2017-11-03 MED ORDER — HYDROCODONE-ACETAMINOPHEN 5-325 MG PO TABS: 1.0000 | ORAL_TABLET | Freq: Four times a day (QID) | ORAL | 0 refills | Status: AC | PRN

## 2017-11-03 MED ORDER — POLYETHYLENE GLYCOL 3350 17 G PO PACK: 17.0000 g | PACK | Freq: Every day | ORAL | 0 refills | Status: AC

## 2017-11-03 NOTE — ED Notes (Signed)
Pt verbalized understanding of discharge paperwork, prescriptions and follow-up care. Ambulatory on discharge.

## 2017-11-03 NOTE — ED Notes (Signed)
Pt reports no BM in 5 days.

## 2017-11-03 NOTE — ED Triage Notes (Signed)
Pt to ER for evaluation of RUQ abdominal pain onset two days ago. Reports recent diagnosis of pancreatic cancer. Denies nausea vomiting and diarrhea. Reports no appetite.

## 2017-11-03 NOTE — ED Provider Notes (Signed)
[facility] EMERGENCY DEPARTMENT Provider Note   CSN: [id number] Arrival date & time: 11/03/17  0802     History   Chief Complaint Chief Complaint  Patient presents with  . Abdominal Pain    HPI Jane Brooks is a 47 y.o. female.  The history is provided by the patient.  Abdominal Pain   This is a chronic problem. The current episode started more than 2 days ago. The problem occurs daily. The problem has not changed since onset.Associated with: Patient diagnosed with pancreatic cancer last week. Got a biliary stent and has follow up GI next week. Sent home with narcotic medicine but ran out. Has some constipation.  The pain is located in the epigastric region and RUQ. The quality of the pain is aching and dull. The pain is at a severity of 3/10. The pain is moderate. Associated symptoms include anorexia and constipation. Pertinent negatives include fever, belching, diarrhea, flatus, hematochezia, melena, nausea, vomiting, dysuria, frequency, hematuria, headaches, arthralgias and myalgias. The symptoms are aggravated by palpation and eating. Relieved by: norco. Past workup includes GI consult and CT scan. Past medical history comments: pancreatic cancer.    Past Medical History:  Diagnosis Date  . Gallstones 10/2017  . Pneumothorax, closed, traumatic    years ago    Patient Active Problem List   Diagnosis Date Noted  . Cholecystitis 10/30/2017  . Jaundice 10/30/2017    Past Surgical History:  Procedure Laterality Date  . BILIARY STENT PLACEMENT  10/31/2017   Procedure: BILIARY STENT PLACEMENT;  Surgeon: [name], MD;  Location[facility] ENDOSCOPY;  Service: Gastroenterology;;  . ERCP N/A 10/31/2017   Procedure: ENDOSCOPIC RETROGRADE CHOLANGIOPANCREATOGRAPHY (ERCP);  Surgeon: [name], MD;  Location: [facility] ENDOSCOPY;  Service: Gastroenterology;  Laterality: N/A;  . SPHINCTEROTOMY  10/31/2017   Procedure: SPHINCTEROTOMY;  Surgeon: [name], MD;   Location: [facility] ENDOSCOPY;  Service: Gastroenterology;;  . TONSILLECTOMY       OB History   None      Home Medications    Prior to Admission medications   Medication Sig Start Date End Date Taking? Authorizing Provider  camphor-menthol [facility]) lotion Apply topically as needed for itching. 10/31/17   Alma Friendly, MD  docusate sodium (COLACE) 250 MG capsule Take 1 capsule (250 mg total) by mouth daily for 14 days. 11/03/17 11/17/17  [place], DO  HYDROcodone-acetaminophen (NORCO/VICODIN) 5-325 MG tablet Take 1-2 tablets by mouth every 6 (six) hours as needed for up to 5 days for moderate pain or severe pain (1 for moderate pain, 2 for severe pain). 11/03/17 11/08/17  Lorinda Copland, DO  hydrOXYzine (ATARAX/VISTARIL) 50 MG tablet Take 1 tablet (50 mg total) by mouth 3 (three) times daily as needed. 10/31/17   Alma Friendly, MD  nicotine (NICODERM CQ - DOSED IN MG/24 HOURS) 14 mg/24hr patch Place 1 patch (14 mg total) onto the skin daily. 11/01/17   Alma Friendly, MD  oxycodone (OXY-IR) 5 MG capsule Take 1 capsule (5 mg total) by mouth every 6 (six) hours as needed for up to 3 days. 10/31/17 11/03/17  Alma Friendly, MD  polyethylene glycol (MIRALAX / Floria Raveling) packet Take 17 g by mouth daily for 14 days. 11/03/17 11/17/17  Lennice Sites, DO    Family History History reviewed. No pertinent family history.  Social History Social History   Tobacco Use  . Smoking status: Current Every Day Smoker    Packs/day: 1.00    Years: 37.00    Pack  years: 37.00  . Smokeless tobacco: Never Used  Substance Use Topics  . 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 Drug use: Never     Allergies   Latex   Review of Systems Review of Systems  Constitutional: Negative for chills and fever.  HENT: Negative for ear pain and sore throat.   Eyes: Negative for pain and visual disturbance.  Respiratory: Negative for cough and  shortness of breath.   Cardiovascular: Negative for chest pain and palpitations.  Gastrointestinal: Positive for abdominal pain, anorexia and constipation. Negative for abdominal distention, blood in stool, diarrhea, flatus, hematochezia, melena, nausea, rectal pain and vomiting.  Genitourinary: Negative for dysuria, frequency and hematuria.  Musculoskeletal: Negative for arthralgias, back pain and myalgias.  Skin: Negative for color change and rash.  Neurological: Negative for seizures, syncope and headaches.  All other systems reviewed and are negative.    Physical Exam Updated Vital Signs  ED Triage Vitals  Enc Vitals Group     BP 11/03/17 0807 (!) 158/88     Pulse Rate 11/03/17 0807 89     Resp 11/03/17 0807 18     Temp 11/03/17 0807 99 F (37.2 C)     Temp Source 11/03/17 0807 Oral     SpO2 11/03/17 0807 100 %     Weight --      Height --      Head Circumference --      Peak Flow --      Pain Score 11/03/17 0808 10     Pain Loc --      Pain Edu? --      Excl. in [place]? --     Physical Exam  Constitutional: She appears well-developed and well-nourished. No distress.  HENT:  Head: Normocephalic and atraumatic.  Eyes: Pupils are equal, round, and reactive to light. Conjunctivae and EOM are normal.  Neck: Neck supple.  Cardiovascular: Normal rate, regular rhythm, normal heart sounds and intact distal pulses.  No murmur heard. Pulmonary/Chest: Effort normal and breath sounds normal. No respiratory distress.  Abdominal: Soft. Normal aorta. There is tenderness in the right upper quadrant and epigastric area. There is no rigidity, no rebound, no guarding, no CVA tenderness, no tenderness at McBurney's point and negative Murphy's sign.  Musculoskeletal: She exhibits no edema.  Neurological: She is alert.  Skin: Skin is warm and dry. Capillary refill takes less than 2 seconds.  Psychiatric: She has a normal mood and affect.  Nursing note and vitals reviewed.    ED  Treatments / Results  Labs (all labs ordered are listed, but only abnormal results are displayed) Labs Reviewed  LIPASE, BLOOD - Abnormal; Notable for the following components:      Result Value   Lipase 54 (*)    All other components within normal limits  COMPREHENSIVE METABOLIC PANEL - Abnormal; Notable for the following components:   Glucose, Bld 106 (*)    Albumin 3.4 (*)    AST 97 (*)    ALT 183 (*)    Alkaline Phosphatase 231 (*)    Total Bilirubin 3.0 (*)    All other components within normal limits  CBC - Abnormal; Notable for the following components:   Hemoglobin 10.4 (*)    HCT 33.2 (*)    MCV 66.7 (*)    MCH 20.9 (*)    RDW 16.6 (*)    Platelets 424 (*)    All other components within  normal limits  URINALYSIS, ROUTINE W REFLEX MICROSCOPIC  I-STAT BETA HCG BLOOD, ED ([facility], [facility], [facility] ONLY)    EKG None  Radiology No results found.  Procedures Procedures (including critical care time)  Medications Ordered in ED Medications  lactated ringers bolus 1,000 mL (0 mLs Intravenous Stopped 11/03/17 0936)  morphine 4 MG/ML injection 4 mg (4 mg Intravenous Given 11/03/17 0845)     Initial Impression / Assessment and Plan / ED Course  I have reviewed the triage vital signs and the nursing notes.  Pertinent labs & imaging results that were available during my care of the patient were reviewed by me and considered in my medical decision making (see chart for details).     Jane Brooks is a 47 year old female recently diagnosed with pancreatic cancer last week in which she had a biliary stent placed who presents to the ED with abdominal pain.  Patient with unremarkable vitals.  No fever.  Patient states that pain has improved on narcotic pain medicine.  However she only had 3-day prescription.  She has follow-up with GI next week.  Patient has had no nausea, no vomiting.  No peritonitis on exam.  Mildly tender in epigastric and right upper quadrant on exam.  She overall  feels well but thinks that she would benefit from more pain medicine.  She is also struggled with some constipation as well.  Patient had lab work done that showed no significant anemia, electrolyte abnormality, kidney injury.  No significant leukocytosis.  Doubt any infectious process.  Likely ongoing pain from cancer process.  Patient was given IV fluids and IV morphine while in the ED.  On the [place] opioid database patient did have prescription for the last 3 days and did appropriately take her medicine.  Given her cancer diagnosis and recent GI intervention believe that patient would benefit from narcotic prescription to help with breakthrough pain.  She was given prescription for Norco and given instructions on safe use.  She was given prescription for MiraLAX and docusate to help with constipation that is likely secondary to narcotic use.  She has follow-up in place with GI and is being set up with a primary care doctor.  Told patient that if she does need further pain medications to come from specialist or primary care doctor.  Patient was discharged from ED in good condition and told her return to ED if symptoms worsen.  This chart was dictated using voice recognition software.  Despite best efforts to proofread,  errors can occur which can change the documentation meaning.   Final Clinical Impressions(s) / ED Diagnoses   Final diagnoses:  Malignant neoplasm of pancreas, unspecified location of malignancy [facility])    ED Discharge Orders         Ordered    HYDROcodone-acetaminophen (NORCO/VICODIN) 5-325 MG tablet  Every 6 hours PRN     11/03/17 0933    polyethylene glycol (MIRALAX / GLYCOLAX) packet  Daily     11/03/17 0933    docusate sodium (COLACE) 250 MG capsule  Daily     11/03/17 0933           Lennice Sites, DO 11/03/17 [phone]

## 2017-11-06 ENCOUNTER — Encounter (HOSPITAL_COMMUNITY): Payer: Self-pay | Admitting: *Deleted

## 2017-11-06 ENCOUNTER — Telehealth: Payer: Self-pay | Admitting: Gastroenterology

## 2017-11-06 NOTE — Telephone Encounter (Signed)
I spoke with [facility] endo and they will do the procedure as long as the pt has a guaranteed ride home.  I advised the pt and she states she has someone to pick her up and will have contact information.  She will keep the appt as scheduled

## 2017-11-08 ENCOUNTER — Emergency Department (HOSPITAL_COMMUNITY)
Admission: EM | Admit: 2017-11-08 | Discharge: 2017-11-08 | Disposition: A | Discharge status: Home / Self Care | Payer: Medicaid Other | Attending: Emergency Medicine | Admitting: Emergency Medicine

## 2017-11-08 ENCOUNTER — Encounter (HOSPITAL_COMMUNITY): Payer: Self-pay

## 2017-11-08 ENCOUNTER — Emergency Department (HOSPITAL_COMMUNITY): Payer: Medicaid Other

## 2017-11-08 ENCOUNTER — Encounter (HOSPITAL_COMMUNITY): Payer: Self-pay | Admitting: *Deleted

## 2017-11-08 ENCOUNTER — Telehealth: Payer: Self-pay

## 2017-11-08 ENCOUNTER — Ambulatory Visit (HOSPITAL_COMMUNITY)
Admission: RE | Admit: 2017-11-08 | Discharge: 2017-11-08 | Disposition: A | Discharge status: Home / Self Care | Payer: Medicaid Other | Source: Other Acute Inpatient Hospital | Attending: Gastroenterology | Admitting: Gastroenterology

## 2017-11-08 ENCOUNTER — Ambulatory Visit (HOSPITAL_COMMUNITY): Payer: Medicaid Other | Admitting: Anesthesiology

## 2017-11-08 ENCOUNTER — Other Ambulatory Visit: Payer: Self-pay

## 2017-11-08 ENCOUNTER — Encounter (HOSPITAL_COMMUNITY)
Admission: RE | Disposition: A | Discharge status: Home / Self Care | Payer: Self-pay | Source: Other Acute Inpatient Hospital | Attending: Gastroenterology

## 2017-11-08 DIAGNOSIS — Z91040 Latex allergy status: Secondary | ICD-10-CM | POA: Insufficient documentation

## 2017-11-08 DIAGNOSIS — R933 Abnormal findings on diagnostic imaging of other parts of digestive tract: Secondary | ICD-10-CM | POA: Diagnosis present

## 2017-11-08 DIAGNOSIS — K8689 Other specified diseases of pancreas: Secondary | ICD-10-CM

## 2017-11-08 DIAGNOSIS — C251 Malignant neoplasm of body of pancreas: Secondary | ICD-10-CM | POA: Insufficient documentation

## 2017-11-08 DIAGNOSIS — R1013 Epigastric pain: Secondary | ICD-10-CM | POA: Insufficient documentation

## 2017-11-08 DIAGNOSIS — C250 Malignant neoplasm of head of pancreas: Secondary | ICD-10-CM

## 2017-11-08 DIAGNOSIS — F17210 Nicotine dependence, cigarettes, uncomplicated: Secondary | ICD-10-CM | POA: Insufficient documentation

## 2017-11-08 DIAGNOSIS — F17200 Nicotine dependence, unspecified, uncomplicated: Secondary | ICD-10-CM | POA: Diagnosis not present

## 2017-11-08 DIAGNOSIS — L299 Pruritus, unspecified: Secondary | ICD-10-CM | POA: Insufficient documentation

## 2017-11-08 DIAGNOSIS — Z79899 Other long term (current) drug therapy: Secondary | ICD-10-CM | POA: Insufficient documentation

## 2017-11-08 DIAGNOSIS — Z8049 Family history of malignant neoplasm of other genital organs: Secondary | ICD-10-CM | POA: Insufficient documentation

## 2017-11-08 DIAGNOSIS — R112 Nausea with vomiting, unspecified: Secondary | ICD-10-CM | POA: Insufficient documentation

## 2017-11-08 DIAGNOSIS — C259 Malignant neoplasm of pancreas, unspecified: Secondary | ICD-10-CM

## 2017-11-08 HISTORY — PX: ESOPHAGOGASTRODUODENOSCOPY: SHX5428

## 2017-11-08 HISTORY — PX: EUS: SHX5427

## 2017-11-08 HISTORY — PX: FINE NEEDLE ASPIRATION: SHX6590

## 2017-11-08 LAB — URINALYSIS, ROUTINE W REFLEX MICROSCOPIC
Bilirubin Urine: NEGATIVE
GLUCOSE, UA: NEGATIVE mg/dL
Hgb urine dipstick: NEGATIVE
Ketones, ur: 20 mg/dL — AB
LEUKOCYTES UA: NEGATIVE
Nitrite: NEGATIVE
PH: 5 (ref 5.0–8.0)
PROTEIN: NEGATIVE mg/dL
Specific Gravity, Urine: 1.024 (ref 1.005–1.030)

## 2017-11-08 LAB — COMPREHENSIVE METABOLIC PANEL
ALBUMIN: 4.1 g/dL (ref 3.5–5.0)
ALK PHOS: 168 U/L — AB (ref 38–126)
ALT: 80 U/L — AB (ref 0–44)
ALT: 80 U/L — ABNORMAL HIGH (ref 0–44)
AST: 42 U/L — AB (ref 15–41)
AST: 48 U/L — AB (ref 15–41)
Albumin: 4 g/dL (ref 3.5–5.0)
Alkaline Phosphatase: 164 U/L — ABNORMAL HIGH (ref 38–126)
Anion gap: 9 (ref 5–15)
Anion gap: 13 (ref 5–15)
BUN: 13 mg/dL (ref 6–20)
BUN: 13 mg/dL (ref 6–20)
CALCIUM: 9.3 mg/dL (ref 8.9–10.3)
CHLORIDE: 104 mmol/L (ref 98–111)
CHLORIDE: 104 mmol/L (ref 98–111)
CO2: 25 mmol/L (ref 22–32)
CO2: 20 mmol/L — ABNORMAL LOW (ref 22–32)
CREATININE: 0.47 mg/dL (ref 0.44–1.00)
Calcium: 9.7 mg/dL (ref 8.9–10.3)
Creatinine, Ser: 0.77 mg/dL (ref 0.44–1.00)
GFR calc Af Amer: >60 mL/min (ref >60)
GFR calc Af Amer: >60 mL/min (ref >60)
GFR calc non Af Amer: >60 mL/min (ref >60)
GLUCOSE: 110 mg/dL — AB (ref 70–99)
Glucose, Bld: 90 mg/dL (ref 70–99)
POTASSIUM: 3.9 mmol/L (ref 3.5–5.1)
Potassium: 4.6 mmol/L (ref 3.5–5.1)
Sodium: 138 mmol/L (ref 135–145)
Sodium: 137 mmol/L (ref 135–145)
Total Bilirubin: 1.6 mg/dL — ABNORMAL HIGH (ref 0.3–1.2)
Total Bilirubin: 2.1 mg/dL — ABNORMAL HIGH (ref 0.3–1.2)
Total Protein: 8 g/dL (ref 6.5–8.1)
Total Protein: 7.6 g/dL (ref 6.5–8.1)

## 2017-11-08 LAB — CBC WITH DIFFERENTIAL/PLATELET
ABS IMMATURE GRANULOCYTES: 0.1 10*3/uL — AB (ref 0.00–0.07)
Basophils Absolute: 0 10*3/uL (ref 0.0–0.1)
Basophils Relative: 0 %
Eosinophils Absolute: 0 10*3/uL (ref 0.0–0.5)
Eosinophils Relative: 0 %
HCT: 38.8 % (ref 36.0–46.0)
Hemoglobin: 11.7 g/dL — ABNORMAL LOW (ref 12.0–15.0)
IMMATURE GRANULOCYTES: 1 %
Lymphocytes Relative: 4 %
Lymphs Abs: 0.6 10*3/uL — ABNORMAL LOW (ref 0.7–4.0)
MCH: 20 pg — ABNORMAL LOW (ref 26.0–34.0)
MCHC: 30.2 g/dL (ref 30.0–36.0)
MCV: 66.4 fL — ABNORMAL LOW (ref 80.0–100.0)
Monocytes Absolute: 0.1 10*3/uL (ref 0.1–1.0)
Monocytes Relative: 1 %
NEUTROS ABS: 13.7 10*3/uL — AB (ref 1.7–7.7)
NEUTROS PCT: 94 %
PLATELETS: ADEQUATE 10*3/uL (ref 150–400)
RBC: 5.84 MIL/uL — AB (ref 3.87–5.11)
RDW: 15.8 % — AB (ref 11.5–15.5)
WBC: 14.5 10*3/uL — AB (ref 4.0–10.5)
nRBC: 0 % (ref 0.0–0.2)

## 2017-11-08 LAB — LIPASE, BLOOD: LIPASE: 32 U/L (ref 11–51)

## 2017-11-08 SURGERY — UPPER ENDOSCOPIC ULTRASOUND (EUS) RADIAL
Anesthesia: Monitor Anesthesia Care

## 2017-11-08 MED ORDER — PROMETHAZINE HCL 25 MG/ML IJ SOLN
12.5000 mg | Freq: Once | INTRAMUSCULAR | Status: AC
  Administered 2017-11-08: 12.5 mg via INTRAVENOUS
  Filled 2017-11-08: qty 1

## 2017-11-08 MED ORDER — OXYCODONE HCL 5 MG/5ML PO SOLN: 5.0000 mg | Freq: Once | ORAL | Status: DC | PRN

## 2017-11-08 MED ORDER — SODIUM CHLORIDE 0.9 % IV BOLUS
1000.0000 mL | Freq: Once | INTRAVENOUS | Status: AC
  Administered 2017-11-08: 1000 mL via INTRAVENOUS

## 2017-11-08 MED ORDER — PROPOFOL 500 MG/50ML IV EMUL
INTRAVENOUS | Status: DC | PRN
  Administered 2017-11-08: 150 ug/kg/min via INTRAVENOUS

## 2017-11-08 MED ORDER — ONDANSETRON HCL 4 MG/2ML IJ SOLN: 4.0000 mg | Freq: Once | INTRAMUSCULAR | Status: DC | PRN

## 2017-11-08 MED ORDER — HYDROMORPHONE HCL 1 MG/ML IJ SOLN
1.0000 mg | Freq: Once | INTRAMUSCULAR | Status: AC
  Administered 2017-11-08: 1 mg via INTRAVENOUS
  Filled 2017-11-08: qty 1

## 2017-11-08 MED ORDER — MIDAZOLAM HCL 5 MG/5ML IJ SOLN
INTRAMUSCULAR | Status: DC | PRN
  Administered 2017-11-08: 2 mg via INTRAVENOUS

## 2017-11-08 MED ORDER — MIDAZOLAM HCL 2 MG/2ML IJ SOLN
INTRAMUSCULAR | Status: AC
  Filled 2017-11-08: qty 2

## 2017-11-08 MED ORDER — LACTATED RINGERS IV SOLN
INTRAVENOUS | Status: DC
  Administered 2017-11-08: 11:00:00 via INTRAVENOUS

## 2017-11-08 MED ORDER — METOCLOPRAMIDE HCL 5 MG/ML IJ SOLN
10.0000 mg | Freq: Once | INTRAMUSCULAR | Status: AC
  Administered 2017-11-08: 10 mg via INTRAVENOUS

## 2017-11-08 MED ORDER — PROPOFOL 10 MG/ML IV BOLUS
INTRAVENOUS | Status: AC
  Filled 2017-11-08: qty 40

## 2017-11-08 MED ORDER — DEXAMETHASONE SODIUM PHOSPHATE 10 MG/ML IJ SOLN
8.0000 mg | Freq: Once | INTRAMUSCULAR | Status: AC
  Administered 2017-11-08: 8 mg via INTRAVENOUS
  Filled 2017-11-08: qty 0.8

## 2017-11-08 MED ORDER — SODIUM CHLORIDE 0.9 % IV SOLN: INTRAVENOUS | Status: DC

## 2017-11-08 MED ORDER — OXYCODONE-ACETAMINOPHEN 5-325 MG PO TABS: 2.0000 | ORAL_TABLET | ORAL | 0 refills | Status: DC | PRN

## 2017-11-08 MED ORDER — FENTANYL CITRATE (PF) 100 MCG/2ML IJ SOLN
25.0000 ug | INTRAMUSCULAR | Status: DC | PRN
  Administered 2017-11-08: 25 ug via INTRAVENOUS

## 2017-11-08 MED ORDER — MORPHINE SULFATE (PF) 4 MG/ML IV SOLN
4.0000 mg | Freq: Once | INTRAVENOUS | Status: AC
  Administered 2017-11-08: 4 mg via INTRAVENOUS
  Filled 2017-11-08: qty 1

## 2017-11-08 MED ORDER — PROPOFOL 10 MG/ML IV BOLUS
INTRAVENOUS | Status: AC
  Filled 2017-11-08: qty 20

## 2017-11-08 MED ORDER — OXYCODONE HCL 5 MG PO TABS: 5.0000 mg | ORAL_TABLET | Freq: Once | ORAL | Status: DC | PRN

## 2017-11-08 MED ORDER — GLUCAGON HCL RDNA (DIAGNOSTIC) 1 MG IJ SOLR
INTRAMUSCULAR | Status: DC | PRN
  Administered 2017-11-08 (×2): .5 mg via INTRAVENOUS

## 2017-11-08 MED ORDER — ONDANSETRON HCL 4 MG/2ML IJ SOLN
INTRAMUSCULAR | Status: DC | PRN
  Administered 2017-11-08 (×2): 4 mg via INTRAVENOUS

## 2017-11-08 MED ORDER — HYDROCODONE-ACETAMINOPHEN 7.5-325 MG PO TABS: 1.0000 | ORAL_TABLET | Freq: Four times a day (QID) | ORAL | 0 refills | Status: AC | PRN

## 2017-11-08 MED ORDER — PROMETHAZINE HCL 25 MG PO TABS: 25.0000 mg | ORAL_TABLET | Freq: Four times a day (QID) | ORAL | 0 refills | Status: DC | PRN

## 2017-11-08 MED ORDER — ONDANSETRON HCL 4 MG/2ML IJ SOLN
4.0000 mg | Freq: Once | INTRAMUSCULAR | Status: AC
  Administered 2017-11-08: 4 mg via INTRAVENOUS
  Filled 2017-11-08: qty 2

## 2017-11-08 MED ORDER — FENTANYL CITRATE (PF) 100 MCG/2ML IJ SOLN
INTRAMUSCULAR | Status: AC
  Filled 2017-11-08: qty 2

## 2017-11-08 MED ORDER — PROPOFOL 500 MG/50ML IV EMUL
INTRAVENOUS | Status: DC | PRN
  Administered 2017-11-08: 50 mg via INTRAVENOUS
  Administered 2017-11-08: 20 mg via INTRAVENOUS
  Administered 2017-11-08: 50 mg via INTRAVENOUS
  Administered 2017-11-08: 20 mg via INTRAVENOUS

## 2017-11-08 NOTE — ED Triage Notes (Signed)
Per [facility], pt coming from home. Pt was diagnosed with pancreatic cancer on the 15th. Pt reports being scoped today at noon and having severe pain since. Pt reports some pain before, but worsened after the procedure. Pt did not receive any medicine from EMS. Pt reports vomiting as well.

## 2017-11-08 NOTE — Anesthesia Preprocedure Evaluation (Addendum)
Anesthesia Evaluation  Patient identified by MRN, date of birth, ID band Patient awake    Reviewed: Allergy & Precautions, NPO status , Patient's Chart, lab work & pertinent test results  Airway Mallampati: II  TM Distance: >3 FB Neck ROM: Full    Dental  (+) Teeth Intact, Dental Advisory Given   Pulmonary Current Smoker,    breath sounds clear to auscultation       Cardiovascular  Rhythm:Regular Rate:Normal     Neuro/Psych    GI/Hepatic   Endo/Other    Renal/GU      Musculoskeletal   Abdominal   Peds  Hematology   Anesthesia Other Findings   Reproductive/Obstetrics                             Anesthesia Physical Anesthesia Plan  ASA: III  Anesthesia Plan: MAC   Post-op Pain Management:    Induction: Intravenous  PONV Risk Score and Plan: Ondansetron and Propofol infusion  Airway Management Planned: Natural Airway and Nasal Cannula  Additional Equipment:   Intra-op Plan:   Post-operative Plan:   Informed Consent: I have reviewed the patients History and Physical, chart, labs and discussed the procedure including the risks, benefits and alternatives for the proposed anesthesia with the patient or authorized representative who has indicated his/her understanding and acceptance.     Dental advisory given  Plan Discussed with: CRNA and Anesthesiologist  Anesthesia Plan Comments:         Anesthesia Quick Evaluation  

## 2017-11-08 NOTE — Discharge Instructions (Signed)

## 2017-11-08 NOTE — Anesthesia Postprocedure Evaluation (Signed)
Anesthesia Post Note  Patient: Jane Brooks  Procedure(s) Performed: UPPER ENDOSCOPIC ULTRASOUND (EUS) RADIAL (N/A )     Patient location during evaluation: PACU Anesthesia Type: MAC Level of consciousness: awake and alert Pain management: pain level controlled Vital Signs Assessment: post-procedure vital signs reviewed and stable Respiratory status: spontaneous breathing, nonlabored ventilation, respiratory function stable and patient connected to nasal cannula oxygen Cardiovascular status: stable and blood pressure returned to baseline Postop Assessment: no apparent nausea or vomiting Anesthetic complications: no    Last Vitals:  Vitals:   11/08/17 1310 11/08/17 1320  BP: 139/83 (!) 161/81  Pulse: 72 70  Resp: 15 (!) 21  Temp: 36.4 C   SpO2: 98% 100%    Last Pain:  Vitals:   11/08/17 1320  TempSrc:   PainSc: 0-No pain                 Nadya Hopwood COKER

## 2017-11-08 NOTE — ED Notes (Signed)
Patient transported to X-ray 

## 2017-11-08 NOTE — Transfer of Care (Signed)
Immediate Anesthesia Transfer of Care Note  Patient: Jane Brooks  Procedure(s) Performed: Procedure(s): UPPER ENDOSCOPIC ULTRASOUND (EUS) RADIAL (N/A)  Patient Location: PACU  Anesthesia Type:MAC  Level of Consciousness:  sedated, patient cooperative and responds to stimulation  Airway & Oxygen Therapy:Patient Spontanous Breathing and Patient connected to face mask oxgen  Post-op Assessment:  Report given to PACU RN and Post -op Vital signs reviewed and stable  Post vital signs:  Reviewed and stable  Last Vitals:  Vitals:   11/08/17 1044  BP: (!) 135/102  Pulse: 82  Resp: 13  Temp: 36.9 C  SpO2: 037%    Complications: No apparent anesthesia complications

## 2017-11-08 NOTE — Telephone Encounter (Signed)
-----   Message from Milus Banister, MD sent at 11/08/2017 12:44 PM EDT ----- Raeanne Barry,  Just completed EUS, FNA. See full report in epic.  - Very vague, irregularly bordered, approximately 2.6cm mass was found in the head of pancreas. This causes biliary and pancreatic duct obstruction. The previously plastic biliary stent is in good position. The mass directly abuts and attenuates the PV/SMV confluence, suggesting invasion. FNA performed and the preliminary cytology review is positive for maligancy (adenocarcinoma).  - [facility] will arrange referrals to medical and surgical oncology. - Case to be discussed at upcoming multidisciplinary GI tumor conference.   Bently Morath, She needs referral to medical oncology and also Dr. Barry Dienes at [facility] for newly diagnosed head of pancreas adenocarcinoma (borderline resectable currently).  Dawn, Can you put her on for next GI cancer conference.  Thanks ALL

## 2017-11-08 NOTE — ED Notes (Signed)
Patient verbalizes understanding of discharge instructions. Opportunity for questioning and answers were provided. Armband removed by staff, pt discharged from ED. Pt ambulatory to lobby. Pt called friend for ride.

## 2017-11-08 NOTE — Op Note (Signed)
[facility] Patient Name: Jane Brooks Procedure Date: 11/08/2017 MRN: [id number] Attending MD: Milus Banister , MD Date of Birth: 06/08/1970 CSN: [id number] Age: 47 Admit Type: Outpatient Procedure:                Upper EUS Indications:              Presented with relatively painless jaundice, Tbili                            6.6, MRI suggests mass in the head of pancreas.                            ERCP Dr. Lyndel Safe 2 weeks ago, biliary sphincterotomy,                            and plastic stent placement through distal CBD                            stricture, not sampled. Tbili 1.6 just prior to                            this procedure today. Providers:                Milus Banister, MD, Cleda Daub, RN, Cletis Athens, Technician, Arnoldo Hooker, CRNA Referring MD:             Carmie End, MD and Carmell Austria, MD Medicines:                Monitored Anesthesia Care Complications:            No immediate complications. Estimated blood loss:                            None. Estimated Blood Loss:     Estimated blood loss: none. Procedure:                Pre-Anesthesia Assessment:                           - Prior to the procedure, a History and Physical                            was performed, and patient medications and                            allergies were reviewed. The patient's tolerance of                            previous anesthesia was also reviewed. The risks                            and benefits of the procedure and the sedation  options and risks were discussed with the patient.                            All questions were answered, and informed consent                            was obtained. Prior Anticoagulants: The patient has                            taken no previous anticoagulant or antiplatelet                            agents. ASA Grade Assessment: II - A patient with          mild systemic disease. After reviewing the risks                            and benefits, the patient was deemed in                            satisfactory condition to undergo the procedure.                           After obtaining informed consent, the endoscope was                            passed under direct vision. Throughout the                            procedure, the patient's blood pressure, pulse, and                            oxygen saturations were monitored continuously. The                            [id number] ([id number]) Olympus Radial EUS was                            introduced through the mouth, and advanced to the                            second part of duodenum. The [id number])                            Olympus Linear EUS was introduced through the                            mouth, and advanced to the second part of duodenum.                            The upper EUS was accomplished without difficulty.                            The patient tolerated the procedure well. Scope  In: Scope Out: Findings:      ENDOSCOPIC FINDING: :      The examined esophagus was endoscopically normal.      The entire examined stomach was endoscopically normal.      The recently placed plastic biliary stent was noted extending across the       major papilla in good position.      ENDOSONOGRAPHIC FINDING: :      1. Very vague, irregularly bordered, heterogeneous, hypoechoic,       (approximately) 2.6cm mass was noted in the head of pancreas. The mass       is causing main pancreatic duct obstruction, dilation (to 30m in the       body). The recently placed biliary stent is in good position in the       currently non-dilated CBD. The mass directly abuts and attenuates the       SMV/confluence, suggesting invasion. The mass was sampled with FNA.       Color Doppler imaging was utilized prior to needle puncture to confirm a       lack of significant vascular structures  within the needle path. Three       passes were made with the 25 gauge needle using a transduodenal       approach. A cytotechnologist was present to evaluate the adequacy of the       specimen.      2. Pancreatic parenchyma in the body, tail of the gland was quite       atrophic.      3. One benign appearing periportal lymphnode was noted. No other       peripancreatic adenopathy.      4. Limited views of the liver, spleen were normal. Impression:               - Very vague, irregularly bordered, approximately                            2.6cm mass was found in the head of pancreas. This                            causes biliary and pancreatic duct obstruction. The                            previously plastic biliary stent is in good                            position. The mass directly abuts and attenuates                            the PV/SMV confluence, suggesting invasion. FNA                            performed and the preliminary cytology review is                            positive for maligancy (adenocarcinoma).                           - [facility] office will arrange referrals to  medical and surgical oncology.                           - Case to be discussed at upcoming                            multidisciplinary GI tumor conference. Moderate Sedation:      N/A- Per Anesthesia Care Recommendation:           - Discharge patient to home (ambulatory). Procedure Code(s):        --- Professional ---                           [phone], Esophagogastroduodenoscopy, flexible,                            transoral; with transendoscopic ultrasound-guided                            intramural or transmural fine needle                            aspiration/biopsy(s), (includes endoscopic                            ultrasound examination limited to the esophagus,                            stomach or duodenum, and adjacent structures) Diagnosis Code(s):         --- Professional ---                           K86.89, Other specified diseases of pancreas                           R93.3, Abnormal findings on diagnostic imaging of                            other parts of digestive tract CPT copyright 2018 American Medical Association. All rights reserved. The codes documented in this report are preliminary and upon coder review may  be revised to meet current compliance requirements. Milus Banister, MD 11/08/2017 12:47:09 PM This report has been signed electronically. Number of Addenda: 0

## 2017-11-08 NOTE — ED Notes (Signed)
ED Provider at bedside. 

## 2017-11-08 NOTE — Interval H&P Note (Signed)
History and Physical Interval Note:  11/08/2017 10:58 AM  Jane Brooks  has presented today for surgery, with the diagnosis of pancreatic mass  The various methods of treatment have been discussed with the patient and family. After consideration of risks, benefits and other options for treatment, the patient has consented to  Procedure(s): UPPER ENDOSCOPIC ULTRASOUND (EUS) RADIAL (N/A) as a surgical intervention .  The patient's history has been reviewed, patient examined, no change in status, stable for surgery.  I have reviewed the patient's chart and labs.  Questions were answered to the patient's satisfaction.     Milus Banister

## 2017-11-08 NOTE — ED Provider Notes (Signed)
[place] [facility] EMERGENCY DEPARTMENT Provider Note   CSN: [id number] Arrival date & time: 11/08/17  1601     History   Chief Complaint Chief Complaint  Patient presents with  . Abdominal Pain    HPI Weston Fulco is a 47 y.o. female.  Pt presents to the ED today with abdominal pain and n/v.  She was recently diagnosed with pancreatic cancer.  She had an US guided ERCP today.  Pt was having pain and nausea when she left.  She said they thought it would be brief and sent her home with nausea meds.  No pain meds.  The pt denies any f/c.     Past Medical History:  Diagnosis Date  . Gallstones 10/2017  . Pneumothorax, closed, traumatic    years ago    Patient Active Problem List   Diagnosis Date Noted  . Pancreatic mass   . Cholecystitis 10/30/2017  . Jaundice 10/30/2017    Past Surgical History:  Procedure Laterality Date  . BILIARY STENT PLACEMENT  10/31/2017   Procedure: BILIARY STENT PLACEMENT;  Surgeon: [name], MD;  Location: [facility] ENDOSCOPY;  Service: Gastroenterology;;  . ERCP N/A 10/31/2017   Procedure: ENDOSCOPIC RETROGRADE CHOLANGIOPANCREATOGRAPHY (ERCP);  Surgeon: [name], MD;  Location: [facility] ENDOSCOPY;  Service: Gastroenterology;  Laterality: N/A;  . ESOPHAGOGASTRODUODENOSCOPY N/A 11/08/2017   Procedure: ESOPHAGOGASTRODUODENOSCOPY (EGD);  Surgeon: Milus Banister, MD;  Location: [facility] ENDOSCOPY;  Service: Endoscopy;  Laterality: N/A;  . EUS N/A 11/08/2017   Procedure: UPPER ENDOSCOPIC ULTRASOUND (EUS) RADIAL;  Surgeon: Milus Banister, MD;  Location: [facility] ENDOSCOPY;  Service: Endoscopy;  Laterality: N/A;  . FINE NEEDLE ASPIRATION  11/08/2017   Procedure: FINE NEEDLE ASPIRATION;  Surgeon: Milus Banister, MD;  Location: [facility] ENDOSCOPY;  Service: Endoscopy;;  . Joan Mayans  10/31/2017   Procedure: Joan Mayans;  Surgeon: [name], MD;  Location: [facility] ENDOSCOPY;  Service: Gastroenterology;;  . TONSILLECTOMY       OB History     None      Home Medications    Prior to Admission medications   Medication Sig Start Date End Date Taking? Authorizing Provider  camphor-menthol [facility]) lotion Apply topically as needed for itching. 10/31/17  Yes Alma Friendly, MD  hydrOXYzine (ATARAX/VISTARIL) 50 MG tablet Take 1 tablet (50 mg total) by mouth 3 (three) times daily as needed. 10/31/17  Yes Alma Friendly, MD  docusate sodium (COLACE) 250 MG capsule Take 1 capsule (250 mg total) by mouth daily for 14 days. 11/03/17 11/17/17  Curatolo, Adam, DO  HYDROcodone-acetaminophen (NORCO) 7.5-325 MG tablet Take 1 tablet by mouth every 6 (six) hours as needed for up to 8 days for moderate pain. 11/08/17 11/16/17  Milus Banister, MD  HYDROcodone-acetaminophen (NORCO/VICODIN) 5-325 MG tablet Take 1-2 tablets by mouth every 6 (six) hours as needed for up to 5 days for moderate pain or severe pain (1 for moderate pain, 2 for severe pain). Patient not taking: Reported on 11/08/2017 11/03/17 11/08/17  Lennice Sites, DO  nicotine (NICODERM CQ - DOSED IN MG/24 HOURS) 14 mg/24hr patch Place 1 patch (14 mg total) onto the skin daily. 11/01/17   Alma Friendly, MD  oxyCODONE-acetaminophen (PERCOCET/ROXICET) 5-325 MG tablet Take 2 tablets by mouth every 4 (four) hours as needed for severe pain. 11/08/17   Isla Pence, MD  polyethylene glycol (MIRALAX / GLYCOLAX) packet Take 17 g by mouth daily for 14 days. 11/03/17 11/17/17  Curatolo, Adam, DO  promethazine (PHENERGAN) 25 MG tablet Take  1 tablet (25 mg total) by mouth every 6 (six) hours as needed for nausea or vomiting. 11/08/17   Isla Pence, MD    Family History No family history on file.  Social History Social History   Tobacco Use  . Smoking status: Current Every Day Smoker    Packs/day: 1.00    Years: 37.00    Pack years: 37.00  . Smokeless tobacco: Never Used  Substance Use Topics  . 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 Drug use: Never     Allergies   Latex   Review of Systems Review of Systems  Gastrointestinal: Positive for abdominal pain, nausea and vomiting.  All other systems reviewed and are negative.    Physical Exam Updated Vital Signs BP (!) 154/90   Pulse 77   Temp 97.7 F (36.5 C) (Oral)   Resp 15   Ht 4' 11"  (1.499 m)   LMP 11/04/2017 Comment: Pregnacy test waiver   SpO2 98%   BMI 25.65 kg/m   Physical Exam  Constitutional: She is oriented to person, place, and time. She appears well-developed. She appears ill.  HENT:  Head: Normocephalic and atraumatic.  Mouth/Throat: Mucous membranes are dry.  Eyes: Pupils are equal, round, and reactive to light. EOM are normal.  Cardiovascular: Normal rate and regular rhythm.  Abdominal: Normal appearance and bowel sounds are normal. There is tenderness in the epigastric area.  Neurological: She is alert and oriented to person, place, and time.  Skin: Skin is warm and dry. Capillary refill takes less than 2 seconds.  Psychiatric: She has a normal mood and affect. Her behavior is normal.  Nursing note and vitals reviewed.    ED Treatments / Results  Labs (all labs ordered are listed, but only abnormal results are displayed) Labs Reviewed  CBC WITH DIFFERENTIAL/PLATELET - Abnormal; Notable for the following components:      Result Value   WBC 14.5 (*)    RBC 5.84 (*)    Hemoglobin 11.7 (*)    MCV 66.4 (*)    MCH 20.0 (*)    RDW 15.8 (*)    Neutro Abs 13.7 (*)    Lymphs Abs 0.6 (*)    Abs Immature Granulocytes 0.10 (*)    All other components within normal limits  COMPREHENSIVE METABOLIC PANEL - Abnormal; Notable for the following components:   CO2 20 (*)    Glucose, Bld 110 (*)    AST 48 (*)    ALT 80 (*)    Alkaline Phosphatase 164 (*)    Total Bilirubin 2.1 (*)    All other components within normal limits  URINALYSIS, ROUTINE W REFLEX MICROSCOPIC - Abnormal; Notable for the following components:   Color,  Urine AMBER (*)    APPearance HAZY (*)    Ketones, ur 20 (*)    All other components within normal limits  LIPASE, BLOOD    EKG None  Radiology Dg Abdomen Acute W/chest  Result Date: 11/08/2017 CLINICAL DATA:  Acute abdominal pain. EXAM: DG ABDOMEN ACUTE W/ 1V CHEST COMPARISON:  None. FINDINGS: Cardiomediastinal silhouette is unremarkable. The lungs are clear. No pleural effusion or pneumothorax. The bowel gas pattern is unremarkable. No dilated bowel loops are present. No evidence of pneumoperitoneum. A CBD stent is identified. No acute bony abnormalities are present. Remote LEFT rib fractures are identified. IMPRESSION: 1. No evidence of acute abnormality. No evidence of bowel obstruction or pneumoperitoneum. 2. CBD stent.  Electronically Signed   By: Margarette Canada M.D.   On: 11/08/2017 18:34    Procedures Procedures (including critical care time)  Medications Ordered in ED Medications  sodium chloride 0.9 % bolus 1,000 mL (0 mLs Intravenous Stopped 11/08/17 2150)  ondansetron (ZOFRAN) injection 4 mg (4 mg Intravenous Given 11/08/17 1620)  morphine 4 MG/ML injection 4 mg (4 mg Intravenous Given 11/08/17 1621)  HYDROmorphone (DILAUDID) injection 1 mg (1 mg Intravenous Given 11/08/17 1904)  promethazine (PHENERGAN) injection 12.5 mg (12.5 mg Intravenous Given 11/08/17 1902)     Initial Impression / Assessment and Plan / ED Course  I have reviewed the triage vital signs and the nursing notes.  Pertinent labs & imaging results that were available during my care of the patient were reviewed by me and considered in my medical decision making (see chart for details).    Pt is feeling better after IVFs, morphine, dilaudid, zofran, and phenergan.  There is no evidence of perforation on Xray.  No pancreatitis from ERCP.  CBD stent in place.  Pt will be d/c home with rx for percocet and phenergan.  [facility] is working on a referral to medical oncology.  LFT elevations have been  ongoing.  Pt knows to return if worse.  Final Clinical Impressions(s) / ED Diagnoses   Final diagnoses:  Epigastric pain  Non-intractable vomiting with nausea, unspecified vomiting type  Malignant neoplasm of pancreas, unspecified location of malignancy [facility]    ED Discharge Orders         Ordered    oxyCODONE-acetaminophen (PERCOCET/ROXICET) 5-325 MG tablet  Every 4 hours PRN     11/08/17 2148    promethazine (PHENERGAN) 25 MG tablet  Every 6 hours PRN     11/08/17 2148           Isla Pence, MD 11/08/17 2151

## 2017-11-09 ENCOUNTER — Telehealth: Payer: Self-pay | Admitting: General Practice

## 2017-11-09 ENCOUNTER — Inpatient Hospital Stay: Payer: Medicaid Other | Attending: Hematology | Admitting: Hematology

## 2017-11-09 ENCOUNTER — Encounter: Payer: Self-pay | Admitting: Hematology

## 2017-11-09 DIAGNOSIS — Z8041 Family history of malignant neoplasm of ovary: Secondary | ICD-10-CM | POA: Diagnosis not present

## 2017-11-09 DIAGNOSIS — C250 Malignant neoplasm of head of pancreas: Secondary | ICD-10-CM | POA: Insufficient documentation

## 2017-11-09 DIAGNOSIS — D563 Thalassemia minor: Secondary | ICD-10-CM | POA: Diagnosis not present

## 2017-11-09 DIAGNOSIS — R948 Abnormal results of function studies of other organs and systems: Secondary | ICD-10-CM | POA: Insufficient documentation

## 2017-11-09 DIAGNOSIS — C259 Malignant neoplasm of pancreas, unspecified: Secondary | ICD-10-CM

## 2017-11-09 DIAGNOSIS — K8021 Calculus of gallbladder without cholecystitis with obstruction: Secondary | ICD-10-CM | POA: Insufficient documentation

## 2017-11-09 DIAGNOSIS — Z79899 Other long term (current) drug therapy: Secondary | ICD-10-CM | POA: Insufficient documentation

## 2017-11-09 DIAGNOSIS — Z801 Family history of malignant neoplasm of trachea, bronchus and lung: Secondary | ICD-10-CM

## 2017-11-09 DIAGNOSIS — Z8781 Personal history of (healed) traumatic fracture: Secondary | ICD-10-CM | POA: Diagnosis not present

## 2017-11-09 DIAGNOSIS — F17210 Nicotine dependence, cigarettes, uncomplicated: Secondary | ICD-10-CM | POA: Diagnosis not present

## 2017-11-09 DIAGNOSIS — D490 Neoplasm of unspecified behavior of digestive system: Secondary | ICD-10-CM

## 2017-11-09 MED ORDER — PROCHLORPERAZINE MALEATE 10 MG PO TABS: 10.0000 mg | ORAL_TABLET | Freq: Four times a day (QID) | ORAL | 3 refills | Status: DC | PRN

## 2017-11-09 MED ORDER — MORPHINE SULFATE 15 MG PO TABS: 7.5000 mg | ORAL_TABLET | Freq: Four times a day (QID) | ORAL | 0 refills | Status: DC | PRN

## 2017-11-09 MED ORDER — ONDANSETRON HCL 8 MG PO TABS: 8.0000 mg | ORAL_TABLET | Freq: Three times a day (TID) | ORAL | 3 refills | Status: DC | PRN

## 2017-11-09 MED ORDER — LIDOCAINE-PRILOCAINE 2.5-2.5 % EX CREA: 1.0000 "application " | TOPICAL_CREAM | CUTANEOUS | 2 refills | Status: DC | PRN

## 2017-11-09 NOTE — Progress Notes (Signed)
START ON PATHWAY REGIMEN - Pancreatic     A cycle is every 14 days:     Oxaliplatin      Leucovorin      Irinotecan      5-Fluorouracil      5-Fluorouracil   **Always confirm dose/schedule in your pharmacy ordering system**  Patient Characteristics: Adenocarcinoma, Borderline Resectable or High Risk, Potentially Resectable, Primary Neoadjuvant Therapy, PS = 0, 1 Histology: Adenocarcinoma Current evidence of distant metastases<= No AJCC T Category: T2 AJCC N Category: N0 AJCC M Category: M0 AJCC 8 Stage Grouping: IB Intent of Therapy: Curative Intent, Discussed with Patient

## 2017-11-09 NOTE — Progress Notes (Signed)
[place]  [phone] Fax:([phone]  Clinic New Consult Note   Patient Care Team: Patient, No Pcp Per as PCP - General (General Practice)   Date of Service:  11/09/2017  Referring physician: Dr. Ardis Hughs   CHIEF COMPLAINTS/PURPOSE OF CONSULTATION:  Pancreatic Cancer  HISTORY OF PRESENTING ILLNESS:  Jane Brooks 47 y.o. female is a here because of newly diagnosed pancreatic cancer. The patient was referred by Dr. Ardis Hughs. The patient presents to the clinic today by herself.    She notes she starting having severe skin itching, jaundice and grey stool. She thought she could fix it herself. She realized she should go to the ED which she did on 10/30/17. She was found to have gall stones and on 10/31/17 she had surgery for removal. Upon procedure it was found she had tumor of pancreas leaning against her bile duct which was causing her juandice. She had a stent placed to prevent this and her jaundice and symptoms improved. Since this she was in and out of ED for abdominal pain.   Today she notes after biopsy yesterday she was nauseous with abdominal pain. She returned to ED and after leaving was able to sleep very well. She is currently alternating Norco and Percocet now for pain. She has filled Percocet last night. She was given Vicodin prescription by Dr. Ardis Hughs that she currently cannot fill. Before this she had never been on prescription pain medication. This is overwhelming for her. She notes she has support from friends in [place]. She is interested in speaking with our Education officer, museum and using our [employer].   Socially she is single, originally from [place] and moved here a few years ago. 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 She is currently looking for a job. Her car previously broke down but recently got it fixed. She lost her home and is currently staying on a friend's couch. Her friend is wheelchair bound and will not be likely  to help her much. She has reached out from her family for support with no avail. She is currently on Medicaid and is planning to apply for disability. She is on food stamp at $149/monthly.   Her PMHx is overall good before this diagnosis. She has had tonsillectomy, a rib fracture that punctured her lungs in the past. In the past she use to drink heavily but currently not excessive. She currently does smoke and is will to try to quit smoking again. She was on Marijuana and Cocaine in the past during her 85s and 30s.  Past family medical history is prevalent with DM, HTN and anemia, obesity. Her mother had a full hysterectomy in her late 69s due to Uterine cancer. Her maternal grandmother passed from lung cancer. 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 Periods for her flow form regular to heavy and back to regular and lost for about 4 days. She is currently on her period and is infrequent lately.     MEDICAL HISTORY:  Past Medical History:  Diagnosis Date  . Gallstones 10/2017  . Pneumothorax, closed, traumatic    years ago    SURGICAL HISTORY: Past Surgical History:  Procedure Laterality Date  . BILIARY STENT PLACEMENT  10/31/2017   Procedure: BILIARY STENT PLACEMENT;  Surgeon: [name], MD;  Location: [facility] ENDOSCOPY;  Service: Gastroenterology;;  . ERCP N/A 10/31/2017   Procedure: ENDOSCOPIC RETROGRADE CHOLANGIOPANCREATOGRAPHY (ERCP);  Surgeon: [name], MD;  Location: [facility] ENDOSCOPY;  Service: Gastroenterology;  Laterality:  N/A;  . ESOPHAGOGASTRODUODENOSCOPY N/A 11/08/2017   Procedure: ESOPHAGOGASTRODUODENOSCOPY (EGD);  Surgeon: Milus Banister, MD;  Location: [facility] ENDOSCOPY;  Service: Endoscopy;  Laterality: N/A;  . EUS N/A 11/08/2017   Procedure: UPPER ENDOSCOPIC ULTRASOUND (EUS) RADIAL;  Surgeon: Milus Banister, MD;  Location: [facility] ENDOSCOPY;  Service: Endoscopy;  Laterality: N/A;  . FINE NEEDLE ASPIRATION  11/08/2017   Procedure: FINE NEEDLE ASPIRATION;  Surgeon: Milus Banister,  MD;  Location: [facility] ENDOSCOPY;  Service: Endoscopy;;  . Joan Mayans  10/31/2017   Procedure: Joan Mayans;  Surgeon: [name], MD;  Location[facility] END[facility]COPY;  Service: Gastroenterology;;  . TONSILLECTOMY      SOCIAL HISTORY: Social History   Socioeconomic History  . Marital status: Single    Spouse name: Not on file  . Number of children: Not on file  . Years of education: Not on file  . Highest education level: Not on file  Occupational History  . Occupation: unemployed  Social Needs  . Financial resource strain: Not on file  . Food insecurity:    Worry: Not on file    Inability: Not on file  . Transportation needs:    Medical: Not on file    Non-medical: Not on file  Tobacco Use  . Smoking status: Current Every Day Smoker    Packs/day: 1.00    Years: 37.00    Pack years: 37.00  . Smokeless tobacco: Never Used  Substance and Sexual Activity  . 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 Drug use: Never  . Sexual activity: Not on file  Lifestyle  . Physical activity:    Days per week: Not on file    Minutes per session: Not on file  . Stress: Not on file  Relationships  . Social connections:    Talks on phone: Not on file    Gets together: Not on file    Attends religious service: Not on file    Active member of club or organization: Not on file    Attends meetings of clubs or organizations: Not on file    Relationship status: Not on file  . Intimate partner violence:    Fear of current or ex partner: Not on file    Emotionally abused: Not on file    Physically abused: Not on file    Forced sexual activity: Not on file  Other Topics Concern  . Not on file  Social History Narrative  . Not on file    FAMILY HISTORY: Family History  Problem Relation Age of Onset  . Diabetes Mother   . Hypertension Mother   . COPD Mother   . Cancer Mother 52       uterine cancer   . Diabetes Sister   . Hypertension Sister   .  Cancer Maternal Grandmother        lung cancer  . Hypertension Sister     ALLERGIES:  is allergic to latex.  MEDICATIONS:  Current Outpatient Medications  Medication Sig Dispense Refill  . camphor-menthol (SARNA) lotion Apply topically as needed for itching. 222 mL 0  . docusate sodium (COLACE) 250 MG capsule Take 1 capsule (250 mg total) by mouth daily for 14 days. 14 capsule 0  . HYDROcodone-acetaminophen (NORCO) 7.5-325 MG tablet Take 1 tablet by mouth every 6 (six) hours as needed for up to 8 days for moderate pain. 30 tablet 0  . hydrOXYzine (ATARAX/VISTARIL) 50 MG tablet Take 1 tablet (50  mg total) by mouth 3 (three) times daily as needed. 20 tablet 0  . nicotine (NICODERM CQ - DOSED IN MG/24 HOURS) 14 mg/24hr patch Place 1 patch (14 mg total) onto the skin daily. 28 patch 0  . oxyCODONE-acetaminophen (PERCOCET/ROXICET) 5-325 MG tablet Take 2 tablets by mouth every 4 (four) hours as needed for severe pain. 15 tablet 0  . polyethylene glycol (MIRALAX / GLYCOLAX) packet Take 17 g by mouth daily for 14 days. 14 each 0  . promethazine (PHENERGAN) 25 MG tablet Take 1 tablet (25 mg total) by mouth every 6 (six) hours as needed for nausea or vomiting. 30 tablet 0   No current facility-administered medications for this visit.     REVIEW OF SYSTEMS:   Constitutional: Denies fevers, chills or abnormal night sweats Eyes: Denies blurriness of vision, double vision or watery eyes Ears, nose, mouth, throat, and face: Denies mucositis or sore throat Respiratory: Denies cough, dyspnea or wheezes Cardiovascular: Denies palpitation, chest discomfort or lower extremity swelling Gastrointestinal:  Denies nausea, heartburn or change in bowel habits (+) Abdominal pain and tenderness Skin: Denies abnormal skin rashes Lymphatics: Denies new lymphadenopathy or easy bruising Neurological:Denies numbness, tingling or new weaknesses Behavioral/Psych: Mood is stable, no new changes  All other systems were  reviewed with the patient and are negative.  PHYSICAL EXAMINATION: ECOG PERFORMANCE STATUS: 1 - Symptomatic but completely ambulatory  Vitals:   11/09/17 1435  BP: (!) 133/91  Pulse: 83  Resp: 20  Temp: 99.2 F (37.3 C)  SpO2: 100%   Filed Weights   11/09/17 1435  Weight: 127 lb 9.6 oz (57.9 kg)    GENERAL:alert, no distress and comfortable SKIN: skin color, texture, turgor are normal, no rashes or significant lesions (+) Diffuse scars on skin from severe itching EYES: normal, conjunctiva are pink and non-injected, sclera clear OROPHARYNX:no exudate, no erythema and lips, buccal mucosa, and tongue normal  NECK: supple, thyroid normal size, non-tender, without nodularity LYMPH:  no palpable lymphadenopathy in the cervical, axillary or inguinal LUNGS: clear to auscultation and percussion with normal breathing effort HEART: regular rate & rhythm and no murmurs and no lower extremity edema ABDOMEN:abdomen soft, non-tender and normal bowel sounds (+) palpable hepatomegaly 2-3 cm below ribcage (+) abdominal tenderness upon palpation Musculoskeletal:no cyanosis of digits and no clubbing  PSYCH: alert & oriented x 3 with fluent speech NEURO: no focal motor/sensory deficits  LABORATORY DATA:  I have reviewed the data as listed CBC Latest Ref Rng & Units 11/08/2017 11/03/2017 10/31/2017  WBC 4.0 - 10.5 K/uL 14.5(H) 9.1 6.0  Hemoglobin 12.0 - 15.0 g/dL 11.7(L) 10.4(L) 9.8(L)  Hematocrit 36.0 - 46.0 % 38.8 33.2(L) 30.1(L)  Platelets 150 - 400 K/uL PLATELET CLUMPS NOTED ON SMEAR, COUNT APPEARS ADEQUATE 424(H) 287    CMP Latest Ref Rng & Units 11/08/2017 11/08/2017 11/03/2017  Glucose 70 - 99 mg/dL 110(H) 90 106(H)  BUN 6 - 20 mg/dL 13 13 8   Creatinine 0.44 - 1.00 mg/dL 0.77 0.47 0.60  Sodium 135 - 145 mmol/L 137 138 138  Potassium 3.5 - 5.1 mmol/L 3.9 4.6 3.5  Chloride 98 - 111 mmol/L 104 104 101  CO2 22 - 32 mmol/L 20(L) 25 27  Calcium 8.9 - 10.3 mg/dL 9.7 9.3 9.2  Total Protein  6.5 - 8.1 g/dL 7.6 8.0 6.9  Total Bilirubin 0.3 - 1.2 mg/dL 2.1(H) 1.6(H) 3.0(H)  Alkaline Phos 38 - 126 U/L 164(H) 168(H) 231(H)  AST 15 - 41 U/L 48(H) 42(H) 97(H)  ALT  0 - 44 U/L 80(H) 80(H) 183(H)    Ca 19-9 10/30/17  Baseline: 140   PATHOLOGY   Biopsy Cytology 11/08/17 Diagnosis FINE NEEDLE ASPIRATION, ENDOSCOPIC, PANCREAS HEAD (SPECIMEN 1 OF 1 COLLECTED 11/08/17): MALIGNANT CELLS CONSISTENT WITH ADENOCARCINOMA.   PROCEDURES   EUS and EGD by Dr. Ardis Hughs 11/08/17  IMPRESSION -Very vague, irregularly bordered, approximately 2.6cm mass was found in the head of pancreas. This causes biliary and pancreatic duct obstruction. The previously plastic biliary stent is in good position. The mass directly abuts and attenuates the PV/SMV confluence, suggesting invasion. FNA performed and the preliminary cytology review is positive for malignancy (adenocarcinoma). - [facility] will arrange referrals to medical and surgical oncology. - Case to be discussed at upcoming multidisciplinary GI tumor conference.   ERCP by Dr. Lyndel Safe 10/31/17  IMPRESSION Malignant appearing CBD stricture (due to pancreatic mass) status post sphincterotomy and stent insertion.    RADIOGRAPHIC STUDIES: I have personally reviewed the radiological images as listed and agreed with the findings in the report. Mr 3d Recon At Scanner  Result Date: 10/30/2017 CLINICAL DATA:  Right upper quadrant pain and abnormal LFTs. EXAM: MRI ABDOMEN WITHOUT AND WITH CONTRAST (INCLUDING MRCP) TECHNIQUE: Multiplanar multisequence MR imaging of the abdomen was performed both before and after the administration of intravenous contrast. Heavily T2-weighted images of the biliary and pancreatic ducts were obtained, and three-dimensional MRCP images were rendered by post processing. CONTRAST:  7 cc Gadavist COMPARISON:  Ultrasound exam from earlier the same day FINDINGS: Lower chest: Unremarkable. Hepatobiliary: No focal mass  lesion within the hepatic parenchyma. Gallbladder is nondistended and there may be some small stones cords the gallbladder neck. No substantial pericholecystic fluid or edema. There is marked intra and extrahepatic biliary duct dilatation. Common duct measures up to 16 mm diameter. The common duct abruptly terminates as it enters the pancreatic head. Pancreas: The pancreatic duct in the body and tail the pancreas is markedly dilated irregular, measuring up to 10 mm diameter. Pancreatic parenchyma in the body and tail of the pancreas is almost completely atrophic. Pancreatic duct abruptly terminates in the pancreatic neck region. Although very subtle, there appears to be a 3 x 2.5 cm area of abnormal signal intensity in the head of the pancreas against which both the common bile duct and the pancreatic duct terminate. Spleen:  No splenomegaly. No focal mass lesion. Adrenals/Urinary Tract: No adrenal nodule or mass. Kidneys are unremarkable. Stomach/Bowel: Stomach is nondistended. No gastric wall thickening. No evidence of outlet obstruction. Duodenum is normally positioned as is the ligament of Treitz. No small bowel or colonic dilatation within the visualized abdomen. Vascular/Lymphatic: No abdominal aortic aneurysm. Portal vein is patent although there is some mass-effect I the process in the head of pancreas. 12 mm short axis portal caval lymph node generate some mass-effect on the IVC. Celiac axis and SMA are patent and fat signal around each vessel appears preserved. Small aortocaval lymph nodes are evident on postcontrast imaging. Other:  No intraperitoneal free fluid. Musculoskeletal: No abnormal marrow enhancement within the visualized bony anatomy. IMPRESSION: 1. Marked intra and extrahepatic biliary duct dilatation with abrupt cut off of the common bile duct at the pancreatic head. This is associated with marked dilatation of the main pancreatic duct also with an abrupt cut off at the level of the  pancreatic head. Pancreatic parenchyma in the body and tail of pancreas is markedly atrophic. A subtle 2.5 x 3 cm lesion with differential signal intensity is identified in the head  of the pancreas which appears slightly hypoenhancing on postcontrast imaging. Together, these features are highly concerning for pancreatic adenocarcinoma. EUS is recommended to further evaluate. 2. Portal vein is patent although there is some mass-effect on the portal splenic confluence and IVC. Fat planes around celiac axis and SMA appear preserved. 3. Upper normal to borderline portal caval lymph node associated with small right para-aortic lymphadenopathy. Electronically Signed   By: Misty Stanley M.D.   On: 10/30/2017 12:48   Dg Ercp Biliary & Pancreatic Ducts  Result Date: 10/31/2017 CLINICAL DATA:  ERCP with sphincterotomy and biliary stent placement EXAM: ERCP TECHNIQUE: Multiple spot images obtained with the fluoroscopic device and submitted for interpretation post-procedure. COMPARISON:  MRCP-10/30/2017 FLUOROSCOPY TIME:  2 minutes, 53 seconds FINDINGS: 6 spot fluoroscopic images of the right upper abdominal quadrant during ERCP are provided for review Initial image demonstrates an ERCP probe overlying the right upper abdominal quadrant. Subsequent images demonstrate selective cannulation and opacification of the common bile duct. There is an abrupt narrowing of the distal aspect of the CBD with apparent shouldering (images 3 and 4) Completion image demonstrates placement of a stent traversing this suspected location of CBD narrowing. IMPRESSION: ERCP with biliary stent placement as above. These images were submitted for radiologic interpretation only. Please see the procedural report for the amount of contrast and the fluoroscopy time utilized. Electronically Signed   By: Sandi Mariscal M.D.   On: 10/31/2017 12:23   Dg Abdomen Acute W/chest  Result Date: 11/08/2017 CLINICAL DATA:  Acute abdominal pain. EXAM: DG ABDOMEN  ACUTE W/ 1V CHEST COMPARISON:  None. FINDINGS: Cardiomediastinal silhouette is unremarkable. The lungs are clear. No pleural effusion or pneumothorax. The bowel gas pattern is unremarkable. No dilated bowel loops are present. No evidence of pneumoperitoneum. A CBD stent is identified. No acute bony abnormalities are present. Remote LEFT rib fractures are identified. IMPRESSION: 1. No evidence of acute abnormality. No evidence of bowel obstruction or pneumoperitoneum. 2. CBD stent. Electronically Signed   By: Margarette Canada M.D.   On: 11/08/2017 18:34   Mr Abdomen Mrcp Moise Boring Contast  Result Date: 10/30/2017 CLINICAL DATA:  Right upper quadrant pain and abnormal LFTs. EXAM: MRI ABDOMEN WITHOUT AND WITH CONTRAST (INCLUDING MRCP) TECHNIQUE: Multiplanar multisequence MR imaging of the abdomen was performed both before and after the administration of intravenous contrast. Heavily T2-weighted images of the biliary and pancreatic ducts were obtained, and three-dimensional MRCP images were rendered by post processing. CONTRAST:  7 cc Gadavist COMPARISON:  Ultrasound exam from earlier the same day FINDINGS: Lower chest: Unremarkable. Hepatobiliary: No focal mass lesion within the hepatic parenchyma. Gallbladder is nondistended and there may be some small stones cords the gallbladder neck. No substantial pericholecystic fluid or edema. There is marked intra and extrahepatic biliary duct dilatation. Common duct measures up to 16 mm diameter. The common duct abruptly terminates as it enters the pancreatic head. Pancreas: The pancreatic duct in the body and tail the pancreas is markedly dilated irregular, measuring up to 10 mm diameter. Pancreatic parenchyma in the body and tail of the pancreas is almost completely atrophic. Pancreatic duct abruptly terminates in the pancreatic neck region. Although very subtle, there appears to be a 3 x 2.5 cm area of abnormal signal intensity in the head of the pancreas against which both the  common bile duct and the pancreatic duct terminate. Spleen:  No splenomegaly. No focal mass lesion. Adrenals/Urinary Tract: No adrenal nodule or mass. Kidneys are unremarkable. Stomach/Bowel: Stomach is nondistended. No  gastric wall thickening. No evidence of outlet obstruction. Duodenum is normally positioned as is the ligament of Treitz. No small bowel or colonic dilatation within the visualized abdomen. Vascular/Lymphatic: No abdominal aortic aneurysm. Portal vein is patent although there is some mass-effect I the process in the head of pancreas. 12 mm short axis portal caval lymph node generate some mass-effect on the IVC. Celiac axis and SMA are patent and fat signal around each vessel appears preserved. Small aortocaval lymph nodes are evident on postcontrast imaging. Other:  No intraperitoneal free fluid. Musculoskeletal: No abnormal marrow enhancement within the visualized bony anatomy. IMPRESSION: 1. Marked intra and extrahepatic biliary duct dilatation with abrupt cut off of the common bile duct at the pancreatic head. This is associated with marked dilatation of the main pancreatic duct also with an abrupt cut off at the level of the pancreatic head. Pancreatic parenchyma in the body and tail of pancreas is markedly atrophic. A subtle 2.5 x 3 cm lesion with differential signal intensity is identified in the head of the pancreas which appears slightly hypoenhancing on postcontrast imaging. Together, these features are highly concerning for pancreatic adenocarcinoma. EUS is recommended to further evaluate. 2. Portal vein is patent although there is some mass-effect on the portal splenic confluence and IVC. Fat planes around celiac axis and SMA appear preserved. 3. Upper normal to borderline portal caval lymph node associated with small right para-aortic lymphadenopathy. Electronically Signed   By: Misty Stanley M.D.   On: 10/30/2017 12:48   US Abdomen Limited Ruq  Result Date: 10/30/2017 CLINICAL DATA:   Right upper quadrant pain for 5 weeks EXAM: ULTRASOUND ABDOMEN LIMITED RIGHT UPPER QUADRANT COMPARISON:  None. FINDINGS: Gallbladder: Gallbladder is contracted and filled with multiple stones and sludge. Gallbladder wall is thickened at 4 mm. Murphy's sign is negative. Common bile duct: Diameter: Prominent dilatation at 17.6 mm. No filling defects identified although the distal bile duct is obscured by bowel gas. Liver: No focal lesion identified. Within normal limits in parenchymal echogenicity. Portal vein is patent on color Doppler imaging with normal direction of blood flow towards the liver. IMPRESSION: Contracted gallbladder filled with small stones and sludge. Gallbladder wall thickening. Changes may indicate cholecystitis in the appropriate clinical setting. Prominent dilatation of extrahepatic bile ducts. Consider obstruction versus choledochal cyst Electronically Signed   By: Lucienne Capers M.D.   On: 10/30/2017 06:31    ASSESSMENT & PLAN:  Jane Brooks is a 47 y.o. female with a history of H/o Gallstones and H/o Pneumothorax from prior trauma.,  Presented with jaundice and abdominal pain.   1. 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 She underwent stent placement by Dr. Lyndel Safe and her jaundice has much improved. -She underwent EUS with fine-needle biopsy of the pancreatic head mass, which showed adenocarcinoma.  I reviewed with patient in detail.  Based on the EUS, the tumor did directly abuts the portal vein and SMV, likely borderline resectable. -Her abdominal MRI showed no evidence of nodal or distant metastasis.  We will get a CT chest and abdomen to complete staging. -I discussed standard of care for pancreatic cancer includes an extensive Whipple Surgery. I discussed In her case her tumor is close to a  blood vessel, so her tumor may not be able to be completely resected. -I recommend neoadjuvant chemotherapy to shrink her tumor to  make it easier to resect. She may or may not require neoadjuvant Radiation. This will be determined after her neoadjuvant chemotherapy. -Given her overall good health and young age, I recommend FOLFIRINOX q2weeks with Neulasta injection for 4 months as she should be able to tolerate it.   --Chemotherapy consent: Side effects including but does not limited to, fatigue, nausea, vomiting, diarrhea, hair loss, neuropathy, fluid retention, renal and kidney dysfunction, neutropenic fever, needed for blood transfusion, bleeding, coronary artery spasm and heart failure, were discussed with patient in great detail. She agrees to proceed. Plan to start in 1-2 weeks.  -the goal of chemo is curative  -I recommend a PAC placement as she will be on long term treatment. She is agreeable.  -Before starting treatment she will attend chemo education class.  -I discussed Dr. [facility] does limited surgeries without insurance. Will work with patient to have coverage, likely on disability and medicaid, in time for surgery.  -I will present her case with Tumor Board next week.  -Recent labs reviewed, her Liver functions are returning to normal but still currently elevated. Total bili at 2.1. She has mild anemia as well.  -F/u in 2 weeks    2. Abdominal pain, secondary to #1 -Currently alternating Norco 1 tabs q6hours and Percocet 2 tabs q4hours as needed.  -She was prescribed Vicodin this week by Dr. Ardis Hughs, but was not able to fill due to finances. I asked her to return the prescription to me  -I will refilled morphine at [facility] for her    3. Social and [place] from [place] to [place] alone 3 years ago away from all of her family. She has little to no family support -Recently lost her job in 06/2017 and is currently looking for a new job -She lives on a  friend's cough. That friend is wheelchair bound.  -She is currently on Medicaid and is planning to apply for disability. She is on food stamp at $149/monthly.  -I reviewed our resources that are available to her including transportation and possible free medication.  -She will meet with one of our social workers ASAP  4. 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 CT Chest Wo Contrast    Standing Status:   Future    Standing Expiration Date:   11/09/2018    Order Specific Question:   Is patient pregnant?    Answer:   No    Order Specific Question:   Preferred imaging location?    Answer:   [facility]    Order Specific Question:   Radiology Contrast Protocol - do NOT remove file path    Answer:   \\[email or web address]  . IR Perc Tun Perit Cath W/Port    Standing Status:   Future    Standing Expiration Date:   01/10/2019    Order Specific Question:   Reason for exam:    Answer:   chemotherapy    Order Specific Question:   Is the patient pregnant?    Answer:   No    Order Specific Question:  Preferred Imaging Location?    Answer:   [facility]  . CT Pelvis Wo Contrast    Standing Status:   Future    Standing Expiration Date:   11/09/2018    Order Specific Question:   Is patient pregnant?    Answer:   No    Order Specific Question:   Preferred imaging location?    Answer:   [facility]    Order Specific Question:   Is Oral Contrast requested for this exam?    Answer:   Yes, Per Radiology protocol    Order Specific Question:   Radiology Contrast Protocol - do NOT remove file path     Answer:   \\[email or web address]    All questions were answered. The patient knows to call the clinic with any problems, questions or concerns. I spent 55 minutes counseling the patient face to face. The total time spent in the appointment was 60 minutes and more than 50% was on counseling.     Truitt Merle, MD 11/09/2017 4:58 PM  I, Joslyn Devon, am acting as scribe for Truitt Merle, MD.   I have reviewed the above documentation for accuracy and completeness, and I agree with the above.

## 2017-11-09 NOTE — Progress Notes (Signed)
  Oncology Nurse Navigator Documentation  Navigator Location: [facility] (11/09/17 1615) Referral date to RadOnc/MedOnc: 11/08/17 (11/09/17 1615) )Navigator Encounter Type: Initial MedOnc (11/09/17 1615)   Met with Marcelo Baldy. Explained role of nurse navigator. Educational information provided on pancreas cancer Contact names and phone numbers were provided for entire [facility] team.  Teach back method was used.  No barriers to care identified at present time.  Will continue to follow as needed. CT scan scheduled for 11/14/17 3:30 PM at [facility]. Patient to arrive at 3:15. Called and Left Vm with this information. Abnormal Finding Date: 10/31/17 (11/09/17 1615) Confirmed Diagnosis Date: 11/08/17 (11/09/17 1615)                 Treatment Phase: Pre-Tx/Tx Discussion (11/09/17 1615) Barriers/Navigation Needs: Financial (11/09/17 1615)   Interventions: Coordination of Care;Psycho-social support (11/09/17 1615)            Acuity: Level 3 (11/09/17 1615)     Acuity Level 3: Ongoing guidance and education provided throughout treatment;Absence of support (11/09/17 1615)   Time Spent with Patient: 30 (11/09/17 1615)

## 2017-11-09 NOTE — Telephone Encounter (Signed)
[facility] CSW Progress Notes  Referral received from [facility] as patient is currently homeless, sleeping on friends couch, unemployed and uninsured.  Spoke w patient on phone to discuss needs/resources.  Patient had appointment this week for Social Security disability application at [facility] Sydell Axon) via SO[employer] process (special program for patients who are currently homeless), application will be submitted after testing and staging has been completed.  Applied for Medicaid while inpatient last week, application currently pending.  Has Food Stamps.  Concerned about how to pay phone bill due at end of month, CSW referred to [facility] for small grant application process.  Patient has also met w Financial Advocate who will help her w [place] when possible to do so.  Patient concerned about affording gas to get to treatments, referred to Transportation Coordinator who will reach out to her next week.  CSW [place] alerted to contact patient next week to check in re needs.  Edwyna Shell, LCSW Clinical Social Worker Phone:  [phone]

## 2017-11-11 ENCOUNTER — Emergency Department (HOSPITAL_COMMUNITY): Admission: EM | Admit: 2017-11-11 | Discharge: 2017-11-11 | Discharge status: Absent without leave | Payer: Self-pay

## 2017-11-13 ENCOUNTER — Other Ambulatory Visit: Payer: Self-pay | Admitting: Radiology

## 2017-11-13 ENCOUNTER — Telehealth: Payer: Self-pay | Admitting: Hematology

## 2017-11-13 NOTE — Telephone Encounter (Signed)
Scheduled appt per 10/25 los - called to confirm appts with patient - patient is aware of upcoming appts.

## 2017-11-14 ENCOUNTER — Inpatient Hospital Stay: Payer: Medicaid Other

## 2017-11-14 ENCOUNTER — Other Ambulatory Visit: Payer: Self-pay

## 2017-11-14 ENCOUNTER — Ambulatory Visit (HOSPITAL_COMMUNITY)
Admission: RE | Admit: 2017-11-14 | Discharge: 2017-11-14 | Disposition: A | Discharge status: Home / Self Care | Payer: Medicaid Other | Source: Ambulatory Visit | Attending: Hematology | Admitting: Hematology

## 2017-11-14 ENCOUNTER — Encounter (HOSPITAL_COMMUNITY): Payer: Self-pay

## 2017-11-14 DIAGNOSIS — D490 Neoplasm of unspecified behavior of digestive system: Secondary | ICD-10-CM | POA: Insufficient documentation

## 2017-11-14 NOTE — Progress Notes (Signed)
Start Folfirinox 11/19/17, Neoadjuvant chemotherapy. Referral to surgeon at [facility].

## 2017-11-15 ENCOUNTER — Ambulatory Visit (HOSPITAL_COMMUNITY)
Admission: RE | Admit: 2017-11-15 | Discharge: 2017-11-15 | Disposition: A | Discharge status: Home / Self Care | Payer: Medicaid Other | Source: Ambulatory Visit | Attending: Hematology | Admitting: Hematology

## 2017-11-15 ENCOUNTER — Encounter (HOSPITAL_COMMUNITY): Payer: Self-pay

## 2017-11-15 ENCOUNTER — Other Ambulatory Visit: Payer: Self-pay | Admitting: Hematology

## 2017-11-15 ENCOUNTER — Telehealth: Payer: Self-pay

## 2017-11-15 ENCOUNTER — Other Ambulatory Visit: Payer: Self-pay

## 2017-11-15 DIAGNOSIS — C259 Malignant neoplasm of pancreas, unspecified: Secondary | ICD-10-CM | POA: Diagnosis present

## 2017-11-15 DIAGNOSIS — Z8049 Family history of malignant neoplasm of other genital organs: Secondary | ICD-10-CM | POA: Insufficient documentation

## 2017-11-15 DIAGNOSIS — Z801 Family history of malignant neoplasm of trachea, bronchus and lung: Secondary | ICD-10-CM | POA: Diagnosis not present

## 2017-11-15 DIAGNOSIS — Z9689 Presence of other specified functional implants: Secondary | ICD-10-CM | POA: Insufficient documentation

## 2017-11-15 DIAGNOSIS — Z91040 Latex allergy status: Secondary | ICD-10-CM | POA: Diagnosis not present

## 2017-11-15 DIAGNOSIS — C250 Malignant neoplasm of head of pancreas: Secondary | ICD-10-CM

## 2017-11-15 DIAGNOSIS — Z9889 Other specified postprocedural states: Secondary | ICD-10-CM | POA: Insufficient documentation

## 2017-11-15 DIAGNOSIS — R918 Other nonspecific abnormal finding of lung field: Secondary | ICD-10-CM | POA: Diagnosis not present

## 2017-11-15 DIAGNOSIS — I700 Atherosclerosis of aorta: Secondary | ICD-10-CM | POA: Insufficient documentation

## 2017-11-15 DIAGNOSIS — Z8249 Family history of ischemic heart disease and other diseases of the circulatory system: Secondary | ICD-10-CM | POA: Insufficient documentation

## 2017-11-15 DIAGNOSIS — F17210 Nicotine dependence, cigarettes, uncomplicated: Secondary | ICD-10-CM | POA: Insufficient documentation

## 2017-11-15 DIAGNOSIS — I2510 Atherosclerotic heart disease of native coronary artery without angina pectoris: Secondary | ICD-10-CM | POA: Diagnosis not present

## 2017-11-15 DIAGNOSIS — Z79899 Other long term (current) drug therapy: Secondary | ICD-10-CM | POA: Insufficient documentation

## 2017-11-15 HISTORY — DX: Malignant neoplasm of pancreas, unspecified: C25.9

## 2017-11-15 HISTORY — PX: IR IMAGING GUIDED PORT INSERTION: IMG5740

## 2017-11-15 LAB — CBC WITH DIFFERENTIAL/PLATELET
Abs Immature Granulocytes: 0.02 10*3/uL (ref 0.00–0.07)
BASOS ABS: 0.1 10*3/uL (ref 0.0–0.1)
BASOS PCT: 1 %
EOS ABS: 0.2 10*3/uL (ref 0.0–0.5)
EOS PCT: 2 %
HCT: 37 % (ref 36.0–46.0)
Hemoglobin: 11.4 g/dL — ABNORMAL LOW (ref 12.0–15.0)
Immature Granulocytes: 0 %
LYMPHS PCT: 20 %
Lymphs Abs: 1.4 10*3/uL (ref 0.7–4.0)
MCH: 20.8 pg — ABNORMAL LOW (ref 26.0–34.0)
MCHC: 30.8 g/dL (ref 30.0–36.0)
MCV: 67.4 fL — ABNORMAL LOW (ref 80.0–100.0)
Monocytes Absolute: 0.3 10*3/uL (ref 0.1–1.0)
Monocytes Relative: 5 %
NRBC: 0 % (ref 0.0–0.2)
Neutro Abs: 5.1 10*3/uL (ref 1.7–7.7)
Neutrophils Relative %: 72 %
PLATELETS: 399 10*3/uL (ref 150–400)
RBC: 5.49 MIL/uL — AB (ref 3.87–5.11)
RDW: 15.2 % (ref 11.5–15.5)
WBC: 7 10*3/uL (ref 4.0–10.5)

## 2017-11-15 LAB — PROTIME-INR
INR: 0.88
PROTHROMBIN TIME: 11.9 s (ref 11.4–15.2)

## 2017-11-15 MED ORDER — SODIUM CHLORIDE 0.9 % IV SOLN
INTRAVENOUS | Status: DC
  Administered 2017-11-15: 08:00:00 via INTRAVENOUS

## 2017-11-15 MED ORDER — HEPARIN SOD (PORK) LOCK FLUSH 100 UNIT/ML IV SOLN
INTRAVENOUS | Status: AC
  Filled 2017-11-15: qty 5

## 2017-11-15 MED ORDER — LIDOCAINE HCL (PF) 1 % IJ SOLN
INTRAMUSCULAR | Status: AC
  Filled 2017-11-15: qty 30

## 2017-11-15 MED ORDER — FENTANYL CITRATE (PF) 100 MCG/2ML IJ SOLN
INTRAMUSCULAR | Status: AC | PRN
  Administered 2017-11-15 (×2): 50 ug via INTRAVENOUS

## 2017-11-15 MED ORDER — LIDOCAINE-EPINEPHRINE (PF) 1 %-1:200000 IJ SOLN
INTRAMUSCULAR | Status: AC | PRN
  Administered 2017-11-15: 10 mL

## 2017-11-15 MED ORDER — HEPARIN SOD (PORK) LOCK FLUSH 100 UNIT/ML IV SOLN
INTRAVENOUS | Status: AC | PRN
  Administered 2017-11-15: 500 [IU] via INTRAVENOUS

## 2017-11-15 MED ORDER — LIDOCAINE HCL (PF) 1 % IJ SOLN
INTRAMUSCULAR | Status: AC | PRN
  Administered 2017-11-15: 5 mL

## 2017-11-15 MED ORDER — MIDAZOLAM HCL 2 MG/2ML IJ SOLN
INTRAMUSCULAR | Status: AC
  Filled 2017-11-15: qty 4

## 2017-11-15 MED ORDER — CEFAZOLIN SODIUM-DEXTROSE 2-4 GM/100ML-% IV SOLN
INTRAVENOUS | Status: AC
  Administered 2017-11-15: 2 g via INTRAVENOUS
  Filled 2017-11-15: qty 100

## 2017-11-15 MED ORDER — MIDAZOLAM HCL 2 MG/2ML IJ SOLN
INTRAMUSCULAR | Status: AC | PRN
  Administered 2017-11-15 (×3): 1 mg via INTRAVENOUS

## 2017-11-15 MED ORDER — LIDOCAINE-EPINEPHRINE (PF) 2 %-1:200000 IJ SOLN
INTRAMUSCULAR | Status: AC
  Filled 2017-11-15: qty 20

## 2017-11-15 MED ORDER — FENTANYL CITRATE (PF) 100 MCG/2ML IJ SOLN
INTRAMUSCULAR | Status: AC
  Filled 2017-11-15: qty 2

## 2017-11-15 MED ORDER — CEFAZOLIN SODIUM-DEXTROSE 2-4 GM/100ML-% IV SOLN
2.0000 g | INTRAVENOUS | Status: AC
  Administered 2017-11-15: 2 g via INTRAVENOUS

## 2017-11-15 MED FILL — MORPHINE SULFATE IR 15 MG T: 15 | 30 days supply | Qty: 60 | Fill #0

## 2017-11-15 NOTE — Telephone Encounter (Signed)
Spoke with patient informed her that if she will get her friend where she is staying to write a note to the effect that she is staying there/address temporarily due to her homeless situation they will approve the grant to be able to get her medication.  Instructed her to bring in asap.  She will have to go to [place].  She verbalized an understanding.

## 2017-11-15 NOTE — Discharge Instructions (Signed)
Implanted [facility] Guide An implanted port is a type of central line that is placed under the skin. Central lines are used to provide IV access when treatment or nutrition needs to be given through a persons veins. Implanted ports are used for long-term IV access. An implanted port may be placed because:  You need IV medicine that would be irritating to the small veins in your hands or arms.  You need long-term IV medicines, such as antibiotics.  You need IV nutrition for a long period.  You need frequent blood draws for lab tests.  You need dialysis.  Implanted ports are usually placed in the chest area, but they can also be placed in the upper arm, the abdomen, or the leg. An implanted port has two main parts:  Reservoir. The reservoir is round and will appear as a small, raised area under your skin. The reservoir is the part where a needle is inserted to give medicines or draw blood.  Catheter. The catheter is a thin, flexible tube that extends from the reservoir. The catheter is placed into a large vein. Medicine that is inserted into the reservoir goes into the catheter and then into the vein.  How will I care for my incision site? Do not get the incision site wet. Bathe or shower as directed by your health care provider. How is my port accessed? Special steps must be taken to access the port:  Before the port is accessed, a numbing cream can be placed on the skin. This helps numb the skin over the port site.  Your health care provider uses a sterile technique to access the port. ? Your health care provider must put on a mask and sterile gloves. ? The skin over your port is cleaned carefully with an antiseptic and allowed to dry. ? The port is gently pinched between sterile gloves, and a needle is inserted into the port.  Only "non-coring" port needles should be used to access the port. Once the port is accessed, a blood return should be checked. This helps ensure that the port  is in the vein and is not clogged.  If your port needs to remain accessed for a constant infusion, a clear (transparent) bandage will be placed over the needle site. The bandage and needle will need to be changed every week, or as directed by your health care provider.  Keep the bandage covering the needle clean and dry. Do not get it wet. Follow your health care providers instructions on how to take a shower or bath while the port is accessed.  If your port does not need to stay accessed, no bandage is needed over the port.  What is flushing? Flushing helps keep the port from getting clogged. Follow your health care providers instructions on how and when to flush the port. Ports are usually flushed with saline solution or a medicine called heparin. The need for flushing will depend on how the port is used.  If the port is used for intermittent medicines or blood draws, the port will need to be flushed: ? After medicines have been given. ? After blood has been drawn. ? As part of routine maintenance.  If a constant infusion is running, the port may not need to be flushed.  How long will my port stay implanted? The port can stay in for as long as your health care provider thinks it is needed. When it is time for the port to come out, surgery will be  done to remove it. The procedure is similar to the one performed when the port was put in. When should I seek immediate medical care? When you have an implanted port, you should seek immediate medical care if:  You notice a bad smell coming from the incision site.  You have swelling, redness, or drainage at the incision site.  You have more swelling or pain at the port site or the surrounding area.  You have a fever that is not controlled with medicine.  This information is not intended to replace advice given to you by your health care provider. Make sure you discuss any questions you have with your health care provider. Document  Released: 01/02/2005 Document Revised: 06/10/2015 Document Reviewed: 09/09/2012 Elsevier Interactive Patient Education  2017 [place] Insertion, Care After This sheet gives you information about how to care for yourself after your procedure. Your health care provider may also give you more specific instructions. If you have problems or questions, contact your health care provider. What can I expect after the procedure? After your procedure, it is common to have:  Discomfort at the port insertion site.  Bruising on the skin over the port. This should improve over 3-4 days.  Follow these instructions at home: [facility] care  After your port is placed, you will get a manufacturer's information card. The card has information about your port. Keep this card with you at all times.  Take care of the port as told by your health care provider. Ask your health care provider if you or a family member can get training for taking care of the port at home. A home health care nurse may also take care of the port.  Make sure to remember what type of port you have. Incision care  Follow instructions from your health care provider about how to take care of your port insertion site. Make sure you: ? Wash your hands with soap and water before you change your bandage (dressing). If soap and water are not available, use hand sanitizer. ? Change your dressing as told by your health care provider. May remove dressing and shower in 24 to 48 hours.  Keep site clean and dry. Replace dressing with bandaid as needed. ? Leave stitches (sutures), skin glue, or adhesive strips in place. These skin closures may need to stay in place for 2 weeks or longer. If adhesive strip edges start to loosen and curl up, you may trim the loose edges. Do not remove adhesive strips completely unless your health care provider tells you to do that.  Check your port insertion site every day for signs of infection. Check  for: ? More redness, swelling, or pain. ? More fluid or blood. ? Warmth. ? Pus or a bad smell. General instructions  Do not take baths, swim, or use a hot tub until your health care provider approves.  Do not lift anything that is heavier than 10 lb (4.5 kg) for a week, or as told by your health care provider.  Ask your health care provider when it is okay to: ? Return to work or school. ? Resume usual physical activities or sports.  Do not drive for 24 hours if you were given a medicine to help you relax (sedative).  Take over-the-counter and prescription medicines only as told by your health care provider.  Wear a medical alert bracelet in case of an emergency. This will tell any health care providers that you have a port.  Keep all follow-up visits as told by your health care provider. This is important. Contact a health care provider if:  You cannot flush your port with saline as directed, or you cannot draw blood from the port.  You have a fever or chills.  You have more redness, swelling, or pain around your port insertion site.  You have more fluid or blood coming from your port insertion site.  Your port insertion site feels warm to the touch.  You have pus or a bad smell coming from the port insertion site. Get help right away if:  You have chest pain or shortness of breath.  You have bleeding from your port that you cannot control. Summary  Take care of the port as told by your health care provider.  Change your dressing as told by your health care provider.  Keep all follow-up visits as told by your health care provider. This information is not intended to replace advice given to you by your health care provider. Make sure you discuss any questions you have with your health care provider. Document Released: 10/23/2012 Document Revised: 11/24/2015 Document Reviewed: 11/24/2015 Elsevier Interactive Patient Education  2017 [place].  Moderate Conscious  Sedation, Adult, Care After These instructions provide you with information about caring for yourself after your procedure. Your health care provider may also give you more specific instructions. Your treatment has been planned according to current medical practices, but problems sometimes occur. Call your health care provider if you have any problems or questions after your procedure. What can I expect after the procedure? After your procedure, it is common:  To feel sleepy for several hours.  To feel clumsy and have poor balance for several hours.  To have poor judgment for several hours.  To vomit if you eat too soon.  Follow these instructions at home: For at least 24 hours after the procedure:   Do not: ? Participate in activities where you could fall or become injured. ? Drive. ? Use heavy machinery. ? Drink alcohol. ? Take sleeping pills or medicines that cause drowsiness. ? Make important decisions or sign legal documents. ? Take care of children on your own.  Rest. Eating and drinking  Follow the diet recommended by your health care provider.  If you vomit: ? Drink water, juice, or soup when you can drink without vomiting. ? Make sure you have little or no nausea before eating solid foods. General instructions  Have a responsible adult stay with you until you are awake and alert.  Take over-the-counter and prescription medicines only as told by your health care provider.  If you smoke, do not smoke without supervision.  Keep all follow-up visits as told by your health care provider. This is important. Contact a health care provider if:  You keep feeling nauseous or you keep vomiting.  You feel light-headed.  You develop a rash.  You have a fever. Get help right away if:  You have trouble breathing. This information is not intended to replace advice given to you by your health care provider. Make sure you discuss any questions you have with your health  care provider. Document Released: 10/23/2012 Document Revised: 06/07/2015 Document Reviewed: 04/24/2015 Elsevier Interactive Patient Education  [employer].

## 2017-11-15 NOTE — Progress Notes (Signed)
Pt declines pregnancy test prior to procedure. Pt states she is not pregnant.

## 2017-11-15 NOTE — Progress Notes (Signed)
Dr. Jacqualyn Posey and Lennette Bihari, [place] notified that pt did not want to have pregnancy test prior to surgery. Pt states she signed a pregnancy refusal waiver in [place] this morning.  Also notified them that pt was not quite sure If her friend who she lives with would be able to stay with her for 24 hours once she got home.

## 2017-11-15 NOTE — H&P (Signed)
Chief Complaint: Patient was seen in consultation today for Port-A-Cath placement  Referring Physician(s): Feng,Yan  Supervising Physician: Corrie Mckusick  Patient Status: [facility] - Out-pt  History of Present Illness: Jane Brooks is a 47 y.o. female smoker with history of newly diagnosed pancreatic cancer who presents today for Port-A-Cath placement for chemotherapy.  Past Medical History:  Diagnosis Date  . Gallstones 10/2017  . Pancreatic cancer ([place])   . Pneumothorax, closed, traumatic    years ago    Past Surgical History:  Procedure Laterality Date  . BILIARY STENT PLACEMENT  10/31/2017   Procedure: BILIARY STENT PLACEMENT;  Surgeon: [name], MD;  Location: [facility] ENDOSCOPY;  Service: Gastroenterology;;  . ERCP N/A 10/31/2017   Procedure: ENDOSCOPIC RETROGRADE CHOLANGIOPANCREATOGRAPHY (ERCP);  Surgeon: [name], MD;  Location: [facility] ENDOSCOPY;  Service: Gastroenterology;  Laterality: N/A;  . ESOPHAGOGASTRODUODENOSCOPY N/A 11/08/2017   Procedure: ESOPHAGOGASTRODUODENOSCOPY (EGD);  Surgeon: Milus Banister, MD;  Location: [facility] ENDOSCOPY;  Service: Endoscopy;  Laterality: N/A;  . EUS N/A 11/08/2017   Procedure: UPPER ENDOSCOPIC ULTRASOUND (EUS) RADIAL;  Surgeon: Milus Banister, MD;  Location: [facility] ENDOSCOPY;  Service: Endoscopy;  Laterality: N/A;  . FINE NEEDLE ASPIRATION  11/08/2017   Procedure: FINE NEEDLE ASPIRATION;  Surgeon: Milus Banister, MD;  Location: [facility] ENDOSCOPY;  Service: Endoscopy;;  . Joan Mayans  10/31/2017   Procedure: Joan Mayans;  Surgeon: [name], MD;  Location: [facility] ENDOSCOPY;  Service: Gastroenterology;;  . TONSILLECTOMY      Allergies: Latex  Medications: Prior to Admission medications   Medication Sig Start Date End Date Taking? Authorizing Provider  camphor-menthol [facility]) lotion Apply topically as needed for itching. 10/31/17  Yes Alma Friendly, MD  HYDROcodone-acetaminophen (NORCO) 7.5-325 MG tablet Take 1 tablet by  mouth every 6 (six) hours as needed for up to 8 days for moderate pain. 11/08/17 11/16/17 Yes Milus Banister, MD  hydrOXYzine (ATARAX/VISTARIL) 50 MG tablet Take 1 tablet (50 mg total) by mouth 3 (three) times daily as needed. 10/31/17  Yes Alma Friendly, MD  ondansetron (ZOFRAN) 8 MG tablet Take 1 tablet (8 mg total) by mouth every 8 (eight) hours as needed for nausea or vomiting. 11/09/17  Yes Truitt Merle, MD  oxyCODONE-acetaminophen (PERCOCET/ROXICET) 5-325 MG tablet Take 2 tablets by mouth every 4 (four) hours as needed for severe pain. 11/08/17  Yes Isla Pence, MD  docusate sodium (COLACE) 250 MG capsule Take 1 capsule (250 mg total) by mouth daily for 14 days. 11/03/17 11/17/17  Curatolo, Adam, DO  lidocaine-prilocaine (EMLA) cream Apply 1 application topically as needed. 11/09/17   Truitt Merle, MD  morphine (MSIR) 15 MG tablet Take 0.5 tablets (7.5 mg total) by mouth every 6 (six) hours as needed for severe pain. 11/09/17   Truitt Merle, MD  nicotine (NICODERM CQ - DOSED IN MG/24 HOURS) 14 mg/24hr patch Place 1 patch (14 mg total) onto the skin daily. 11/01/17   Alma Friendly, MD  polyethylene glycol (MIRALAX / GLYCOLAX) packet Take 17 g by mouth daily for 14 days. 11/03/17 11/17/17  Curatolo, Adam, DO  prochlorperazine (COMPAZINE) 10 MG tablet Take 1 tablet (10 mg total) by mouth every 6 (six) hours as needed for nausea or vomiting. 11/09/17   Truitt Merle, MD  promethazine (PHENERGAN) 25 MG tablet Take 1 tablet (25 mg total) by mouth every 6 (six) hours as needed for nausea or vomiting. 11/08/17   Isla Pence, MD     Family History  Problem Relation Age of Onset  .  Diabetes Mother   . Hypertension Mother   . COPD Mother   . Cancer Mother 59       uterine cancer   . Diabetes Sister   . Hypertension Sister   . Cancer Maternal Grandmother        lung cancer  . Hypertension Sister     Social History   Socioeconomic History  . Marital status: Single    Spouse name: Not  on file  . Number of children: Not on file  . Years of education: Not on file  . Highest education level: Not on file  Occupational History  . Occupation: unemployed  Social Needs  . Financial resource strain: Not on file  . Food insecurity:    Worry: Not on file    Inability: Not on file  . Transportation needs:    Medical: Not on file    Non-medical: Not on file  Tobacco Use  . Smoking status: Current Every Day Smoker    Packs/day: 1.00    Years: 37.00    Pack years: 37.00  . Smokeless tobacco: Never Used  Substance and Sexual Activity  . 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 Drug use: Never  . Sexual activity: Not on file  Lifestyle  . Physical activity:    Days per week: Not on file    Minutes per session: Not on file  . Stress: Not on file  Relationships  . Social connections:    Talks on phone: Not on file    Gets together: Not on file    Attends religious service: Not on file    Active member of club or organization: Not on file    Attends meetings of clubs or organizations: Not on file    Relationship status: Not on file  Other Topics Concern  . Not on file  Social History Narrative  . Not on file     Review of Systems denies fever, headache, chest pain, dyspnea, cough, back pain or abnormal bleeding.  She does have abdominal pain as well as some intermittent nausea and vomiting.  Vital Signs: BP 125/87 (BP Location: Left Arm)   Pulse 100   Temp 98.1 F (36.7 C) (Oral)   Resp 19   Ht 4' 11.5" (1.511 m)   Wt 127 lb 9.6 oz (57.9 kg)   LMP 11/11/2017 Comment: Pregnacy test waiver   SpO2 97%   BMI 25.34 kg/m   Physical Exam awake, alert.  Chest clear to auscultation bilaterally.  Heart with regular rate and rhythm.  Abdomen soft, positive bowel sounds, tender right upper quadrant/epigastric region to palpation.  No lower extremity edema.  Imaging: Mr 3d Recon At Scanner  Result Date: 10/30/2017 CLINICAL  DATA:  Right upper quadrant pain and abnormal LFTs. EXAM: MRI ABDOMEN WITHOUT AND WITH CONTRAST (INCLUDING MRCP) TECHNIQUE: Multiplanar multisequence MR imaging of the abdomen was performed both before and after the administration of intravenous contrast. Heavily T2-weighted images of the biliary and pancreatic ducts were obtained, and three-dimensional MRCP images were rendered by post processing. CONTRAST:  7 cc Gadavist COMPARISON:  Ultrasound exam from earlier the same day FINDINGS: Lower chest: Unremarkable. Hepatobiliary: No focal mass lesion within the hepatic parenchyma. Gallbladder is nondistended and there may be some small stones cords the gallbladder neck. No substantial pericholecystic fluid or edema. There is marked intra and extrahepatic biliary duct dilatation. Common duct measures up to 16 mm diameter. The common duct  abruptly terminates as it enters the pancreatic head. Pancreas: The pancreatic duct in the body and tail the pancreas is markedly dilated irregular, measuring up to 10 mm diameter. Pancreatic parenchyma in the body and tail of the pancreas is almost completely atrophic. Pancreatic duct abruptly terminates in the pancreatic neck region. Although very subtle, there appears to be a 3 x 2.5 cm area of abnormal signal intensity in the head of the pancreas against which both the common bile duct and the pancreatic duct terminate. Spleen:  No splenomegaly. No focal mass lesion. Adrenals/Urinary Tract: No adrenal nodule or mass. Kidneys are unremarkable. Stomach/Bowel: Stomach is nondistended. No gastric wall thickening. No evidence of outlet obstruction. Duodenum is normally positioned as is the ligament of Treitz. No small bowel or colonic dilatation within the visualized abdomen. Vascular/Lymphatic: No abdominal aortic aneurysm. Portal vein is patent although there is some mass-effect I the process in the head of pancreas. 12 mm short axis portal caval lymph node generate some mass-effect  on the IVC. Celiac axis and SMA are patent and fat signal around each vessel appears preserved. Small aortocaval lymph nodes are evident on postcontrast imaging. Other:  No intraperitoneal free fluid. Musculoskeletal: No abnormal marrow enhancement within the visualized bony anatomy. IMPRESSION: 1. Marked intra and extrahepatic biliary duct dilatation with abrupt cut off of the common bile duct at the pancreatic head. This is associated with marked dilatation of the main pancreatic duct also with an abrupt cut off at the level of the pancreatic head. Pancreatic parenchyma in the body and tail of pancreas is markedly atrophic. A subtle 2.5 x 3 cm lesion with differential signal intensity is identified in the head of the pancreas which appears slightly hypoenhancing on postcontrast imaging. Together, these features are highly concerning for pancreatic adenocarcinoma. EUS is recommended to further evaluate. 2. Portal vein is patent although there is some mass-effect on the portal splenic confluence and IVC. Fat planes around celiac axis and SMA appear preserved. 3. Upper normal to borderline portal caval lymph node associated with small right para-aortic lymphadenopathy. Electronically Signed   By: Misty Stanley M.D.   On: 10/30/2017 12:48   Dg Ercp Biliary & Pancreatic Ducts  Result Date: 10/31/2017 CLINICAL DATA:  ERCP with sphincterotomy and biliary stent placement EXAM: ERCP TECHNIQUE: Multiple spot images obtained with the fluoroscopic device and submitted for interpretation post-procedure. COMPARISON:  MRCP-10/30/2017 FLUOROSCOPY TIME:  2 minutes, 53 seconds FINDINGS: 6 spot fluoroscopic images of the right upper abdominal quadrant during ERCP are provided for review Initial image demonstrates an ERCP probe overlying the right upper abdominal quadrant. Subsequent images demonstrate selective cannulation and opacification of the common bile duct. There is an abrupt narrowing of the distal aspect of the CBD  with apparent shouldering (images 3 and 4) Completion image demonstrates placement of a stent traversing this suspected location of CBD narrowing. IMPRESSION: ERCP with biliary stent placement as above. These images were submitted for radiologic interpretation only. Please see the procedural report for the amount of contrast and the fluoroscopy time utilized. Electronically Signed   By: Sandi Mariscal M.D.   On: 10/31/2017 12:23   Dg Abdomen Acute W/chest  Result Date: 11/08/2017 CLINICAL DATA:  Acute abdominal pain. EXAM: DG ABDOMEN ACUTE W/ 1V CHEST COMPARISON:  None. FINDINGS: Cardiomediastinal silhouette is unremarkable. The lungs are clear. No pleural effusion or pneumothorax. The bowel gas pattern is unremarkable. No dilated bowel loops are present. No evidence of pneumoperitoneum. A CBD stent is identified. No acute bony  abnormalities are present. Remote LEFT rib fractures are identified. IMPRESSION: 1. No evidence of acute abnormality. No evidence of bowel obstruction or pneumoperitoneum. 2. CBD stent. Electronically Signed   By: Margarette Canada M.D.   On: 11/08/2017 18:34   Mr Abdomen Mrcp Moise Boring Contast  Result Date: 10/30/2017 CLINICAL DATA:  Right upper quadrant pain and abnormal LFTs. EXAM: MRI ABDOMEN WITHOUT AND WITH CONTRAST (INCLUDING MRCP) TECHNIQUE: Multiplanar multisequence MR imaging of the abdomen was performed both before and after the administration of intravenous contrast. Heavily T2-weighted images of the biliary and pancreatic ducts were obtained, and three-dimensional MRCP images were rendered by post processing. CONTRAST:  7 cc Gadavist COMPARISON:  Ultrasound exam from earlier the same day FINDINGS: Lower chest: Unremarkable. Hepatobiliary: No focal mass lesion within the hepatic parenchyma. Gallbladder is nondistended and there may be some small stones cords the gallbladder neck. No substantial pericholecystic fluid or edema. There is marked intra and extrahepatic biliary duct  dilatation. Common duct measures up to 16 mm diameter. The common duct abruptly terminates as it enters the pancreatic head. Pancreas: The pancreatic duct in the body and tail the pancreas is markedly dilated irregular, measuring up to 10 mm diameter. Pancreatic parenchyma in the body and tail of the pancreas is almost completely atrophic. Pancreatic duct abruptly terminates in the pancreatic neck region. Although very subtle, there appears to be a 3 x 2.5 cm area of abnormal signal intensity in the head of the pancreas against which both the common bile duct and the pancreatic duct terminate. Spleen:  No splenomegaly. No focal mass lesion. Adrenals/Urinary Tract: No adrenal nodule or mass. Kidneys are unremarkable. Stomach/Bowel: Stomach is nondistended. No gastric wall thickening. No evidence of outlet obstruction. Duodenum is normally positioned as is the ligament of Treitz. No small bowel or colonic dilatation within the visualized abdomen. Vascular/Lymphatic: No abdominal aortic aneurysm. Portal vein is patent although there is some mass-effect I the process in the head of pancreas. 12 mm short axis portal caval lymph node generate some mass-effect on the IVC. Celiac axis and SMA are patent and fat signal around each vessel appears preserved. Small aortocaval lymph nodes are evident on postcontrast imaging. Other:  No intraperitoneal free fluid. Musculoskeletal: No abnormal marrow enhancement within the visualized bony anatomy. IMPRESSION: 1. Marked intra and extrahepatic biliary duct dilatation with abrupt cut off of the common bile duct at the pancreatic head. This is associated with marked dilatation of the main pancreatic duct also with an abrupt cut off at the level of the pancreatic head. Pancreatic parenchyma in the body and tail of pancreas is markedly atrophic. A subtle 2.5 x 3 cm lesion with differential signal intensity is identified in the head of the pancreas which appears slightly hypoenhancing on  postcontrast imaging. Together, these features are highly concerning for pancreatic adenocarcinoma. EUS is recommended to further evaluate. 2. Portal vein is patent although there is some mass-effect on the portal splenic confluence and IVC. Fat planes around celiac axis and SMA appear preserved. 3. Upper normal to borderline portal caval lymph node associated with small right para-aortic lymphadenopathy. Electronically Signed   By: Misty Stanley M.D.   On: 10/30/2017 12:48   US Abdomen Limited Ruq  Result Date: 10/30/2017 CLINICAL DATA:  Right upper quadrant pain for 5 weeks EXAM: ULTRASOUND ABDOMEN LIMITED RIGHT UPPER QUADRANT COMPARISON:  None. FINDINGS: Gallbladder: Gallbladder is contracted and filled with multiple stones and sludge. Gallbladder wall is thickened at 4 mm. Murphy's sign is negative. Common  bile duct: Diameter: Prominent dilatation at 17.6 mm. No filling defects identified although the distal bile duct is obscured by bowel gas. Liver: No focal lesion identified. Within normal limits in parenchymal echogenicity. Portal vein is patent on color Doppler imaging with normal direction of blood flow towards the liver. IMPRESSION: Contracted gallbladder filled with small stones and sludge. Gallbladder wall thickening. Changes may indicate cholecystitis in the appropriate clinical setting. Prominent dilatation of extrahepatic bile ducts. Consider obstruction versus choledochal cyst Electronically Signed   By: Lucienne Capers M.D.   On: 10/30/2017 06:31    Labs:  CBC: Recent Labs    10/31/17 0145 11/03/17 0813 11/08/17 1623 11/15/17 0735  WBC 6.0 9.1 14.5* 7.0  HGB 9.8* 10.4* 11.7* 11.4*  HCT 30.1* 33.2* 38.8 37.0  PLT 287 424* PLATELET CLUMPS NOTED ON SMEAR, COUNT APPEARS ADEQUATE 399    COAGS: Recent Labs    10/30/17 1553 11/15/17 0735  INR 0.92 0.88    BMP: Recent Labs    10/31/17 0145 11/03/17 0813 11/08/17 1114 11/08/17 1623  NA 137 138 138 137  K 3.4* 3.5 4.6  3.9  CL 103 101 104 104  CO2 25 27 25  20*  GLUCOSE 128* 106* 90 110*  BUN 11 8 13 13   CALCIUM 8.9 9.2 9.3 9.7  CREATININE 0.78 0.60 0.47 0.77  GFRNONAA >60 >60 >60 >60  GFRAA >60 >60 >60 >60    LIVER FUNCTION TESTS: Recent Labs    10/31/17 0145 11/03/17 0813 11/08/17 1114 11/08/17 1623  BILITOT 6.3* 3.0* 1.6* 2.1*  AST 151* 97* 42* 48*  ALT 198* 183* 80* 80*  ALKPHOS 325* 231* 168* 164*  PROT 6.1* 6.9 8.0 7.6  ALBUMIN 3.0* 3.4* 4.0 4.1    TUMOR MARKERS: No results for input(s): AFPTM, CEA, CA199, CHROMGRNA in the last 8760 hours.  Assessment and Plan:  47 y.o. female smoker with history of newly diagnosed pancreatic cancer who presents today for Port-A-Cath placement for chemotherapy.Risks and benefits of image guided port-a-catheter placement was discussed with the patient including, but not limited to bleeding, infection, pneumothorax, or fibrin sheath development and need for additional procedures.  All of the patient's questions were answered, patient is agreeable to proceed. Consent signed and in chart.     Thank you for this interesting consult.  I greatly enjoyed meeting Bambi Fehnel and look forward to participating in their care.  A copy of this report was sent to the requesting provider on this date.  Electronically Signed: D. Rowe Robert, PA-C 11/15/2017, 8:40 AM   I spent a total of 25 minutes    in face to face in clinical consultation, greater than 50% of which was counseling/coordinating care for port a cath placement

## 2017-11-15 NOTE — Procedures (Signed)
Interventional Radiology Procedure Note  Procedure: Placement of a right IJ approach single lumen PowerPort.  Tip is positioned at the superior cavoatrial junction and catheter is ready for immediate use.  Complications: None Recommendations:  - Ok to shower tomorrow - Do not submerge for 7 days - Routine line care   Signed,  Cassandr Cederberg S. Jamilla Galli, DO   

## 2017-11-16 ENCOUNTER — Encounter: Payer: Self-pay | Admitting: Hematology

## 2017-11-16 MED FILL — ONDANSETRON HCL 8 MG TABLET: 8 | 7 days supply | Qty: 20 | Fill #0

## 2017-11-16 MED FILL — LIDOCAINE-PRILOCAINE CREAM: 2.5-2.5 | 30 days supply | Qty: 30 | Fill #0

## 2017-11-16 MED FILL — PROCHLORPERAZINE 10 MG TAB: 10 | 8 days supply | Qty: 30 | Fill #0

## 2017-11-16 NOTE — Progress Notes (Signed)
Met w/ pt to discuss the [place].  I went over what it covers and gave her an expense sheet.  Pt is not working right now so she provided a letter of support and was approved for the $500 [employer].  I also made a copy of her letter of support and past W2's and emailed them to [place] for possible drug replacement.  She completed a Medicaid application w/ the [employer] on the hospital side and it's in a pending status.  She has my card for any questions or concerns she may have in the future.

## 2017-11-18 ENCOUNTER — Other Ambulatory Visit: Payer: Self-pay | Admitting: Hematology

## 2017-11-18 DIAGNOSIS — C250 Malignant neoplasm of head of pancreas: Secondary | ICD-10-CM

## 2017-11-19 ENCOUNTER — Inpatient Hospital Stay: Payer: Medicaid Other

## 2017-11-19 ENCOUNTER — Encounter: Payer: Self-pay | Admitting: Pharmacy Technician

## 2017-11-19 ENCOUNTER — Inpatient Hospital Stay: Payer: Medicaid Other | Attending: Hematology

## 2017-11-19 ENCOUNTER — Inpatient Hospital Stay (HOSPITAL_BASED_OUTPATIENT_CLINIC_OR_DEPARTMENT_OTHER): Payer: Medicaid Other | Admitting: Hematology

## 2017-11-19 ENCOUNTER — Encounter: Payer: Self-pay | Admitting: Hematology

## 2017-11-19 VITALS — BP 110/88 | HR 87 | Temp 98.8°F | Resp 18 | Ht 59.5 in | Wt 127.8 lb

## 2017-11-19 VITALS — BP 140/88 | HR 87 | Temp 98.3°F | Resp 16

## 2017-11-19 DIAGNOSIS — K5900 Constipation, unspecified: Secondary | ICD-10-CM | POA: Insufficient documentation

## 2017-11-19 DIAGNOSIS — Z5111 Encounter for antineoplastic chemotherapy: Secondary | ICD-10-CM | POA: Diagnosis present

## 2017-11-19 DIAGNOSIS — I10 Essential (primary) hypertension: Secondary | ICD-10-CM

## 2017-11-19 DIAGNOSIS — Z23 Encounter for immunization: Secondary | ICD-10-CM | POA: Insufficient documentation

## 2017-11-19 DIAGNOSIS — I2510 Atherosclerotic heart disease of native coronary artery without angina pectoris: Secondary | ICD-10-CM

## 2017-11-19 DIAGNOSIS — D563 Thalassemia minor: Secondary | ICD-10-CM | POA: Diagnosis not present

## 2017-11-19 DIAGNOSIS — C250 Malignant neoplasm of head of pancreas: Secondary | ICD-10-CM | POA: Diagnosis not present

## 2017-11-19 DIAGNOSIS — Z79899 Other long term (current) drug therapy: Secondary | ICD-10-CM

## 2017-11-19 DIAGNOSIS — D509 Iron deficiency anemia, unspecified: Secondary | ICD-10-CM | POA: Insufficient documentation

## 2017-11-19 DIAGNOSIS — Z7689 Persons encountering health services in other specified circumstances: Secondary | ICD-10-CM | POA: Insufficient documentation

## 2017-11-19 DIAGNOSIS — R220 Localized swelling, mass and lump, head: Secondary | ICD-10-CM | POA: Diagnosis not present

## 2017-11-19 DIAGNOSIS — R110 Nausea: Secondary | ICD-10-CM

## 2017-11-19 DIAGNOSIS — E119 Type 2 diabetes mellitus without complications: Secondary | ICD-10-CM

## 2017-11-19 DIAGNOSIS — G893 Neoplasm related pain (acute) (chronic): Secondary | ICD-10-CM

## 2017-11-19 DIAGNOSIS — E669 Obesity, unspecified: Secondary | ICD-10-CM

## 2017-11-19 DIAGNOSIS — I700 Atherosclerosis of aorta: Secondary | ICD-10-CM | POA: Diagnosis not present

## 2017-11-19 DIAGNOSIS — D72829 Elevated white blood cell count, unspecified: Secondary | ICD-10-CM | POA: Diagnosis not present

## 2017-11-19 DIAGNOSIS — F17210 Nicotine dependence, cigarettes, uncomplicated: Secondary | ICD-10-CM | POA: Diagnosis not present

## 2017-11-19 LAB — CMP (CANCER CENTER ONLY)
ALK PHOS: 88 U/L (ref 38–126)
ALT: 25 U/L (ref 0–44)
ANION GAP: 9 (ref 5–15)
AST: 23 U/L (ref 15–41)
Albumin: 3.6 g/dL (ref 3.5–5.0)
BUN: 11 mg/dL (ref 6–20)
CALCIUM: 9.1 mg/dL (ref 8.9–10.3)
CO2: 27 mmol/L (ref 22–32)
Chloride: 103 mmol/L (ref 98–111)
Creatinine: 0.68 mg/dL (ref 0.44–1.00)
GFR, Estimated: >60 mL/min (ref >60)
Glucose, Bld: 89 mg/dL (ref 70–99)
POTASSIUM: 4.1 mmol/L (ref 3.5–5.1)
SODIUM: 139 mmol/L (ref 135–145)
TOTAL PROTEIN: 7.1 g/dL (ref 6.5–8.1)
Total Bilirubin: 0.9 mg/dL (ref 0.3–1.2)

## 2017-11-19 LAB — CBC WITH DIFFERENTIAL (CANCER CENTER ONLY)
Abs Immature Granulocytes: 0.02 10*3/uL (ref 0.00–0.07)
BASOS ABS: 0.1 10*3/uL (ref 0.0–0.1)
BASOS PCT: 1 %
EOS PCT: 3 %
Eosinophils Absolute: 0.2 10*3/uL (ref 0.0–0.5)
HEMATOCRIT: 35 % — AB (ref 36.0–46.0)
Hemoglobin: 10.9 g/dL — ABNORMAL LOW (ref 12.0–15.0)
Immature Granulocytes: 0 %
Lymphocytes Relative: 25 %
Lymphs Abs: 1.6 10*3/uL (ref 0.7–4.0)
MCH: 20.5 pg — ABNORMAL LOW (ref 26.0–34.0)
MCHC: 31.1 g/dL (ref 30.0–36.0)
MCV: 65.8 fL — AB (ref 80.0–100.0)
MONO ABS: 0.4 10*3/uL (ref 0.1–1.0)
Monocytes Relative: 6 %
NEUTROS ABS: 4.1 10*3/uL (ref 1.7–7.7)
NRBC: 0 % (ref 0.0–0.2)
Neutrophils Relative %: 65 %
Platelet Count: 322 10*3/uL (ref 150–400)
RBC: 5.32 MIL/uL — AB (ref 3.87–5.11)
RDW: 14.8 % (ref 11.5–15.5)
WBC: 6.3 10*3/uL (ref 4.0–10.5)

## 2017-11-19 MED ORDER — ATROPINE SULFATE 1 MG/ML IJ SOLN
0.5000 mg | Freq: Once | INTRAMUSCULAR | Status: AC | PRN
  Administered 2017-11-19: 0.5 mg via INTRAVENOUS

## 2017-11-19 MED ORDER — PALONOSETRON HCL INJECTION 0.25 MG/5ML
0.2500 mg | Freq: Once | INTRAVENOUS | Status: AC
  Administered 2017-11-19: 0.25 mg via INTRAVENOUS

## 2017-11-19 MED ORDER — ATROPINE SULFATE 1 MG/ML IJ SOLN
INTRAMUSCULAR | Status: AC
  Filled 2017-11-19: qty 1

## 2017-11-19 MED ORDER — LEUCOVORIN CALCIUM INJECTION 350 MG
400.0000 mg/m2 | Freq: Once | INTRAVENOUS | Status: AC
  Administered 2017-11-19: 624 mg via INTRAVENOUS
  Filled 2017-11-19: qty 31.2

## 2017-11-19 MED ORDER — DEXTROSE 5 % IV SOLN
Freq: Once | INTRAVENOUS | Status: AC
  Administered 2017-11-19: 11:00:00 via INTRAVENOUS
  Filled 2017-11-19: qty 250

## 2017-11-19 MED ORDER — IRINOTECAN HCL CHEMO INJECTION 100 MG/5ML
150.0000 mg/m2 | Freq: Once | INTRAVENOUS | Status: AC
  Administered 2017-11-19: 240 mg via INTRAVENOUS
  Filled 2017-11-19: qty 12

## 2017-11-19 MED ORDER — SODIUM CHLORIDE 0.9 % IV SOLN: 10.0000 mg | Freq: Once | INTRAVENOUS | Status: DC

## 2017-11-19 MED ORDER — DEXAMETHASONE SODIUM PHOSPHATE 10 MG/ML IJ SOLN
INTRAMUSCULAR | Status: AC
  Filled 2017-11-19: qty 1

## 2017-11-19 MED ORDER — PALONOSETRON HCL INJECTION 0.25 MG/5ML
INTRAVENOUS | Status: AC
  Filled 2017-11-19: qty 5

## 2017-11-19 MED ORDER — SODIUM CHLORIDE 0.9 % IV SOLN
2400.0000 mg/m2 | INTRAVENOUS | Status: DC
  Administered 2017-11-19: 3750 mg via INTRAVENOUS
  Filled 2017-11-19: qty 75

## 2017-11-19 MED ORDER — LORATADINE 10 MG PO TABS: 10.0000 mg | ORAL_TABLET | Freq: Every day | ORAL | 2 refills | Status: DC

## 2017-11-19 MED ORDER — OXALIPLATIN CHEMO INJECTION 100 MG/20ML
85.0000 mg/m2 | Freq: Once | INTRAVENOUS | Status: AC
  Administered 2017-11-19: 135 mg via INTRAVENOUS
  Filled 2017-11-19: qty 7

## 2017-11-19 MED ORDER — DEXAMETHASONE SODIUM PHOSPHATE 10 MG/ML IJ SOLN
10.0000 mg | Freq: Once | INTRAMUSCULAR | Status: AC
  Administered 2017-11-19: 10 mg via INTRAVENOUS

## 2017-11-19 MED FILL — SM LORATADINE 10 MG TABS: 10 | 30 days supply | Qty: 30 | Fill #0

## 2017-11-19 NOTE — Progress Notes (Signed)
[place]  [phone]) [phone] [phone]  Clinic Follow Up Note   Patient Care Team: Patient, No Pcp Per as PCP - General ([place]) Milus Banister, MD as Attending Physician (Gastroenterology) Truitt Merle, MD as Consulting Physician (Hematology)   Date of Service:  11/19/2017   CHIEF COMPLAINTS:  F/u for Pancreatic Cancer  Oncology History   Cancer Staging Pancreatic cancer [facility]) Staging form: Exocrine Pancreas, AJCC 8th Edition - Clinical stage from 11/08/2017: Stage IB (cT2, cN0, cM0) - Signed by Truitt Merle, MD on 11/09/2017       Pancreatic cancer ([place])   10/30/2017 Imaging    MRI Abdomen 10/30/17  IMPRESSION: 1. Marked intra and extrahepatic biliary duct dilatation with abrupt cut off of the common bile duct at the pancreatic head. This is associated with marked dilatation of the main pancreatic duct also with an abrupt cut off at the level of the pancreatic head. Pancreatic parenchyma in the body and tail of pancreas is markedly atrophic. A subtle 2.5 x 3 cm lesion with differential signal intensity is identified in the head of the pancreas which appears slightly hypoenhancing on postcontrast imaging. Together, these features are highly concerning for pancreatic adenocarcinoma. EUS is recommended to further evaluate. 2. Portal vein is patent although there is some mass-effect on the portal splenic confluence and IVC. Fat planes around celiac axis and SMA appear preserved. 3. Upper normal to borderline portal caval lymph node associated with small right para-aortic lymphadenopathy.      10/30/2017 Tumor Marker    Baseline Ca 19-9 at 140    10/31/2017 Procedure    ERCP by Dr. Lyndel Safe 10/31/17  IMPRESSION Malignant appearing CBD stricture (due to pancreatic mass) status post sphincterotomy and stent insertion.    11/08/2017 Cancer Staging    Staging form: Exocrine Pancreas, AJCC 8th Edition - Clinical stage from  11/08/2017: Stage IB (cT2, cN0, cM0) - Signed by Truitt Merle, MD on 11/09/2017    11/08/2017 Procedure    EUS and EGD by Dr. Ardis Hughs 11/08/17  IMPRESSION -Very vague, irregularly bordered, approximately 2.6cm mass was found in the head of pancreas. This causes biliary and pancreatic duct obstruction. The previously plastic biliary stent is in good position. The mass directly abuts and attenuates the PV/SMV confluence, suggesting invasion. FNA performed and the preliminary cytology review is positive for maligancy (adenocarcinoma). - [place] [facility] office will arrange referrals to medical and surgical oncology. - Case to be discussed at upcoming multidisciplinary GI tumor conference.    11/08/2017 Initial Biopsy    Biopsy Cytology 11/08/17 Diagnosis FINE NEEDLE ASPIRATION, ENDOSCOPIC, PANCREAS HEAD (SPECIMEN 1 OF 1 COLLECTED 11/08/17): MALIGNANT CELLS CONSISTENT WITH ADENOCARCINOMA.    11/09/2017 Initial Diagnosis    Pancreatic cancer ([place])    11/15/2017 Imaging    CT Chest and Pelvis WO Contrast 11/15/17 IMPRESSION: 1. No findings of active malignancy in the chest or pelvis. 2. Aortic Atherosclerosis (ICD10-I70.0). Coronary atherosclerosis. 3. Faint centrilobular nodularity in the lung apices, raising the possibility of hypersensitivity pneumonitis or less likely respiratory bronchiolitis. Airway thickening is present, suggesting bronchitis or reactive airways disease.    11/19/2017 -  Chemotherapy    FOLFIRINOX q2weeks with Neulasta injection for 4 months starting 11/19/17      HISTORY OF PRESENTING ILLNESS:  Jane Brooks 47 y.o. female is a here because of newly diagnosed pancreatic cancer. The patient was referred by Dr. Ardis Hughs. The patient presents to the clinic today by herself.    She notes she starting having  severe skin itching, jaundice and grey stool. She thought she could fix it herself. She realized she should go to the ED which she did on 10/30/17. She was found to  have gall stones and on 10/31/17 she had surgery for removal. Upon procedure it was found she had tumor of pancreas leaning against her bile duct which was causing her juandice. She had a stent placed to prevent this and her jaundice and symptoms improved. Since this she was in and out of ED for abdominal pain.   Today she notes after biopsy yesterday she was nauseous with abdominal pain. She returned to ED and after leaving was able to sleep very well. She is currently alternating Norco and Percocet now for pain. She has filled Percocet last night. She was given Vicodin prescription by Dr. Ardis Hughs that she currently cannot fill. Before this she had never been on prescription pain medication. This is overwhelming for her. She notes she has support from friends in [place]. She is interested in speaking with our Education officer, museum and using our [employer].   Socially she is single, originally from [place] and moved here a few years ago. 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 She is currently looking for a job. Her car previously broke down but recently got it fixed. She lost her home and is currently staying on a friend's couch. Her friend is wheelchair bound and will not be likely to help her much. She has reached out from her family for support with no avail. She is currently on Medicaid and is planning to apply for disability. She is on food stamp at $149/monthly.   Her PMHx is overall good before this diagnosis. She has had tonsillectomy, a rib fracture that punctured her lungs in the past. In the past she use to drink heavily but currently not excessive. She currently does smoke and is will to try to quit smoking again. She was on Marijuana and Cocaine in the past during her 88s and 30s.  Past family medical history is prevalent with DM, HTN and anemia, obesity. Her mother had a full hysterectomy in her late 62s due to Uterine cancer. Her maternal grandmother passed from lung cancer. She  notes she has a Thalassemia trait. Periods for her flow form regular to heavy and back to regular and lost for about 4 days. She is currently on her period and is infrequent lately.    CURRENT THERAPY: neoadjuvant FOLFIRINOX q2weeks with Udenyca injection for 4 months starting 11/19/17  INTERVAL HISTORY  Katriona Schmierer is here for a follow up and first cycle FOLFIRNOX. Since her visit last week she had her port placed and CT chest/pelvis on 11/15/17.  She presents to the clinic today with her friend. She is doing well, but states that the port site is tender especially when she applies pressure on it.  She still complains of feet itching, but states that her urine is lighter in color.    MEDICAL HISTORY:  Past Medical History:  Diagnosis Date  . Gallstones 10/2017  . Pancreatic cancer ([place])   . Pneumothorax, closed, traumatic    years ago    SURGICAL HISTORY: Past Surgical History:  Procedure Laterality Date  . BILIARY STENT PLACEMENT  10/31/2017   Procedure: BILIARY STENT PLACEMENT;  Surgeon: [name], MD;  Location: [facility] ENDOSCOPY;  Service: Gastroenterology;;  . ERCP N/A 10/31/2017   Procedure: ENDOSCOPIC RETROGRADE CHOLANGIOPANCREATOGRAPHY (ERCP);  Surgeon: [name], MD;  Location[facility] ENDOSCOPY;  Service: Gastroenterology;  Laterality: N/A;  . ESOPHAGOGASTRODUODENOSCOPY N/A 11/08/2017   Procedure: ESOPHAGOGASTRODUODENOSCOPY (EGD);  Surgeon: Milus Banister, MD;  Location: [facility] ENDOSCOPY;  Service: Endoscopy;  Laterality: N/A;  . EUS N/A 11/08/2017   Procedure: UPPER ENDOSCOPIC ULTRASOUND (EUS) RADIAL;  Surgeon: Milus Banister, MD;  Location: [facility] ENDOSCOPY;  Service: Endoscopy;  Laterality: N/A;  . FINE NEEDLE ASPIRATION  11/08/2017   Procedure: FINE NEEDLE ASPIRATION;  Surgeon: Milus Banister, MD;  Location: [facility] ENDOSCOPY;  Service: Endoscopy;;  . IR IMAGING GUIDED PORT INSERTION  11/15/2017  . SPHINCTEROTOMY  10/31/2017   Procedure: SPHINCTEROTOMY;  Surgeon: [name], MD;  Location: [facility] ENDOSCOPY;  Service: Gastroenterology;;  . TONSILLECTOMY      SOCIAL HISTORY: Social History   Socioeconomic History  . Marital status: Single    Spouse name: Not on file  . Number of children: Not on file  . Years of education: Not on file  . Highest education level: Not on file  Occupational History  . Occupation: unemployed  Social Needs  . Financial resource strain: Not on file  . Food insecurity:    Worry: Not on file    Inability: Not on file  . Transportation needs:    Medical: Not on file    Non-medical: Not on file  Tobacco Use  . Smoking status: Current Every Day Smoker    Packs/day: 1.00    Years: 37.00    Pack years: 37.00  . Smokeless tobacco: Never Used  Substance and Sexual Activity  . 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 Drug use: Never  . Sexual activity: Not on file  Lifestyle  . Physical activity:    Days per week: Not on file    Minutes per session: Not on file  . Stress: Not on file  Relationships  . Social connections:    Talks on phone: Not on file    Gets together: Not on file    Attends religious service: Not on file    Active member of club or organization: Not on file    Attends meetings of clubs or organizations: Not on file    Relationship status: Not on file  . Intimate partner violence:    Fear of current or ex partner: Not on file    Emotionally abused: Not on file    Physically abused: Not on file    Forced sexual activity: Not on file  Other Topics Concern  . Not on file  Social History Narrative  . Not on file    FAMILY HISTORY: Family History  Problem Relation Age of Onset  . Diabetes Mother   . Hypertension Mother   . COPD Mother   . Cancer Mother 85       uterine cancer   . Diabetes Sister   . Hypertension Sister   . Cancer Maternal Grandmother        lung cancer  . Hypertension Sister     ALLERGIES:  is allergic to latex.  MEDICATIONS:   Current Outpatient Medications  Medication Sig Dispense Refill  . camphor-menthol (SARNA) lotion Apply topically as needed for itching. 222 mL 0  . hydrOXYzine (ATARAX/VISTARIL) 50 MG tablet Take 1 tablet (50 mg total) by mouth 3 (three) times daily as needed. 20 tablet 0  . lidocaine-prilocaine (EMLA) cream Apply 1 application topically as needed. 30 g 2  . morphine (MSIR) 15 MG tablet Take 0.5 tablets (7.5 mg  total) by mouth every 6 (six) hours as needed for severe pain. 60 tablet 0  . nicotine (NICODERM CQ - DOSED IN MG/24 HOURS) 14 mg/24hr patch Place 1 patch (14 mg total) onto the skin daily. 28 patch 0  . ondansetron (ZOFRAN) 8 MG tablet Take 1 tablet (8 mg total) by mouth every 8 (eight) hours as needed for nausea or vomiting. 20 tablet 3  . oxyCODONE-acetaminophen (PERCOCET/ROXICET) 5-325 MG tablet Take 2 tablets by mouth every 4 (four) hours as needed for severe pain. 15 tablet 0  . prochlorperazine (COMPAZINE) 10 MG tablet Take 1 tablet (10 mg total) by mouth every 6 (six) hours as needed for nausea or vomiting. 30 tablet 3  . promethazine (PHENERGAN) 25 MG tablet Take 1 tablet (25 mg total) by mouth every 6 (six) hours as needed for nausea or vomiting. 30 tablet 0   No current facility-administered medications for this visit.    Facility-Administered Medications Ordered in Other Visits  Medication Dose Route Frequency Provider Last Rate Last Dose  . fluorouracil (ADRUCIL) 3,750 mg in sodium chloride 0.9 % 75 mL chemo infusion  2,400 mg/m2 (Treatment Plan Recorded) Intravenous 1 day or 1 dose Truitt Merle, MD   3,750 mg at 11/19/17 1605    REVIEW OF SYSTEMS:   Constitutional: Denies fevers, chills or abnormal night sweats (+) port site tenderness Eyes: Denies blurriness of vision, double vision or watery eyes Ears, nose, mouth, throat, and face: Denies mucositis or sore throat Respiratory: Denies cough, dyspnea or wheezes Cardiovascular: Denies palpitation, chest discomfort or lower  extremity swelling Gastrointestinal:  Denies nausea, heartburn or change in bowel habits  Skin: Denies abnormal skin rashes (+) feet itching Lymphatics: Denies new lymphadenopathy or easy bruising Neurological:Denies numbness, tingling or new weaknesses Behavioral/Psych: Mood is stable, no new changes  All other systems were reviewed with the patient and are negative.  PHYSICAL EXAMINATION: ECOG PERFORMANCE STATUS: 1 - Symptomatic but completely ambulatory  Vitals:   11/19/17 0929  BP: 110/88  Pulse: 87  Resp: 18  Temp: 98.8 F (37.1 C)  SpO2: 100%   Filed Weights   11/19/17 0929  Weight: 127 lb 12.8 oz (58 kg)    GENERAL:alert, no distress and comfortable SKIN: skin color, texture, turgor are normal, no rashes or significant lesions EYES: normal, conjunctiva are pink and non-injected, sclera clear OROPHARYNX:no exudate, no erythema and lips, buccal mucosa, and tongue normal  NECK: supple, thyroid normal size, non-tender, without nodularity LYMPH:  no palpable lymphadenopathy in the cervical, axillary or inguinal LUNGS: clear to auscultation and percussion with normal breathing effort HEART: regular rate & rhythm and no murmurs and no lower extremity edema ABDOMEN:abdomen soft, non-tender and normal bowel sounds  Musculoskeletal:no cyanosis of digits and no clubbing  PSYCH: alert & oriented x 3 with fluent speech NEURO: no focal motor/sensory deficits  LABORATORY DATA:  I have reviewed the data as listed CBC Latest Ref Rng & Units 11/19/2017 11/15/2017 11/08/2017  WBC 4.0 - 10.5 K/uL 6.3 7.0 14.5(H)  Hemoglobin 12.0 - 15.0 g/dL 10.9(L) 11.4(L) 11.7(L)  Hematocrit 36.0 - 46.0 % 35.0(L) 37.0 38.8  Platelets 150 - 400 K/uL 322 399 PLATELET CLUMPS NOTED ON SMEAR, COUNT APPEARS ADEQUATE    CMP Latest Ref Rng & Units 11/19/2017 11/08/2017 11/08/2017  Glucose 70 - 99 mg/dL 89 110(H) 90  BUN 6 - 20 mg/dL 11 13 13   Creatinine 0.44 - 1.00 mg/dL 0.68 0.77 0.47  Sodium 135 - 145  mmol/L 139 137 138  Potassium 3.5 - 5.1 mmol/L 4.1 3.9 4.6  Chloride 98 - 111 mmol/L 103 104 104  CO2 22 - 32 mmol/L 27 20(L) 25  Calcium 8.9 - 10.3 mg/dL 9.1 9.7 9.3  Total Protein 6.5 - 8.1 g/dL 7.1 7.6 8.0  Total Bilirubin 0.3 - 1.2 mg/dL 0.9 2.1(H) 1.6(H)  Alkaline Phos 38 - 126 U/L 88 164(H) 168(H)  AST 15 - 41 U/L 23 48(H) 42(H)  ALT 0 - 44 U/L 25 80(H) 80(H)    Ca 19-9 10/30/17  Baseline: 140   PATHOLOGY   Biopsy Cytology 11/08/17 Diagnosis FINE NEEDLE ASPIRATION, ENDOSCOPIC, PANCREAS HEAD (SPECIMEN 1 OF 1 COLLECTED 11/08/17): MALIGNANT CELLS CONSISTENT WITH ADENOCARCINOMA.   PROCEDURES   EUS and EGD by Dr. Ardis Hughs 11/08/17  IMPRESSION -Very vague, irregularly bordered, approximately 2.6cm mass was found in the head of pancreas. This causes biliary and pancreatic duct obstruction. The previously plastic biliary stent is in good position. The mass directly abuts and attenuates the PV/SMV confluence, suggesting invasion. FNA performed and the preliminary cytology review is positive for malignancy (adenocarcinoma). - [place] [facility] office will arrange referrals to medical and surgical oncology. - Case to be discussed at upcoming multidisciplinary GI tumor conference.   ERCP by Dr. Lyndel Safe 10/31/17  IMPRESSION Malignant appearing CBD stricture (due to pancreatic mass) status post sphincterotomy and stent insertion.    RADIOGRAPHIC STUDIES: I have personally reviewed the radiological images as listed and agreed with the findings in the report. Ct Chest Wo Contrast  Result Date: 11/15/2017 CLINICAL DATA:  Pancreatic cancer, staging workup. Intermittent right upper quadrant abdominal pain. Weight loss. Constipation and nausea. EXAM: CT CHEST AND PELVIS WITHOUT CONTRAST TECHNIQUE: Multidetector CT imaging of the chest and pelvis was performed following the standard protocol without IV contrast. COMPARISON:  Overlapping portions of MRI abdomen from 10/30/2017 FINDINGS: CT  CHEST FINDINGS Cardiovascular: Coronary, aortic arch, and branch vessel atherosclerotic vascular disease. Mediastinum/Nodes: Scattered mediastinal and hilar lymph nodes are not pathologically enlarged by size criteria. Lungs/Pleura: Mild airway thickening. Faint centrilobular nodularity in the apices. Upper abdomen: We partially image a biliary stent and pneumobilia with indistinctness of the visualized part of the porta hepatis. Musculoskeletal: Old left rib deformities from prior fractures. CT PELVIS FINDINGS Lower urinary Tract: Unremarkable Bowel: Several small sigmoid colon diverticula are observed without inflammation. Appendix normal. Vascular/Lymphatic: Mild atherosclerotic calcification of the common iliac arteries. Reproductive: Unremarkable Other: No supplemental non-categorized findings. Musculoskeletal: Unremarkable IMPRESSION: 1. No findings of active malignancy in the chest or pelvis. 2. Aortic Atherosclerosis (ICD10-I70.0). Coronary atherosclerosis. 3. Faint centrilobular nodularity in the lung apices, raising the possibility of hypersensitivity pneumonitis or less likely respiratory bronchiolitis. Airway thickening is present, suggesting bronchitis or reactive airways disease. Electronically Signed   By: Van Clines M.D.   On: 11/15/2017 10:10   Ct Pelvis Wo Contrast  Result Date: 11/15/2017 CLINICAL DATA:  Pancreatic cancer, staging workup. Intermittent right upper quadrant abdominal pain. Weight loss. Constipation and nausea. EXAM: CT CHEST AND PELVIS WITHOUT CONTRAST TECHNIQUE: Multidetector CT imaging of the chest and pelvis was performed following the standard protocol without IV contrast. COMPARISON:  Overlapping portions of MRI abdomen from 10/30/2017 FINDINGS: CT CHEST FINDINGS Cardiovascular: Coronary, aortic arch, and branch vessel atherosclerotic vascular disease. Mediastinum/Nodes: Scattered mediastinal and hilar lymph nodes are not pathologically enlarged by size criteria.  Lungs/Pleura: Mild airway thickening. Faint centrilobular nodularity in the apices. Upper abdomen: We partially image a biliary stent and pneumobilia with indistinctness of the visualized part of the porta hepatis. Musculoskeletal:  Old left rib deformities from prior fractures. CT PELVIS FINDINGS Lower urinary Tract: Unremarkable Bowel: Several small sigmoid colon diverticula are observed without inflammation. Appendix normal. Vascular/Lymphatic: Mild atherosclerotic calcification of the common iliac arteries. Reproductive: Unremarkable Other: No supplemental non-categorized findings. Musculoskeletal: Unremarkable IMPRESSION: 1. No findings of active malignancy in the chest or pelvis. 2. Aortic Atherosclerosis (ICD10-I70.0). Coronary atherosclerosis. 3. Faint centrilobular nodularity in the lung apices, raising the possibility of hypersensitivity pneumonitis or less likely respiratory bronchiolitis. Airway thickening is present, suggesting bronchitis or reactive airways disease. Electronically Signed   By: Van Clines M.D.   On: 11/15/2017 10:10   Mr 3d Recon At Scanner  Result Date: 10/30/2017 CLINICAL DATA:  Right upper quadrant pain and abnormal LFTs. EXAM: MRI ABDOMEN WITHOUT AND WITH CONTRAST (INCLUDING MRCP) TECHNIQUE: Multiplanar multisequence MR imaging of the abdomen was performed both before and after the administration of intravenous contrast. Heavily T2-weighted images of the biliary and pancreatic ducts were obtained, and three-dimensional MRCP images were rendered by post processing. CONTRAST:  7 cc Gadavist COMPARISON:  Ultrasound exam from earlier the same day FINDINGS: Lower chest: Unremarkable. Hepatobiliary: No focal mass lesion within the hepatic parenchyma. Gallbladder is nondistended and there may be some small stones cords the gallbladder neck. No substantial pericholecystic fluid or edema. There is marked intra and extrahepatic biliary duct dilatation. Common duct measures up to  16 mm diameter. The common duct abruptly terminates as it enters the pancreatic head. Pancreas: The pancreatic duct in the body and tail the pancreas is markedly dilated irregular, measuring up to 10 mm diameter. Pancreatic parenchyma in the body and tail of the pancreas is almost completely atrophic. Pancreatic duct abruptly terminates in the pancreatic neck region. Although very subtle, there appears to be a 3 x 2.5 cm area of abnormal signal intensity in the head of the pancreas against which both the common bile duct and the pancreatic duct terminate. Spleen:  No splenomegaly. No focal mass lesion. Adrenals/Urinary Tract: No adrenal nodule or mass. Kidneys are unremarkable. Stomach/Bowel: Stomach is nondistended. No gastric wall thickening. No evidence of outlet obstruction. Duodenum is normally positioned as is the ligament of Treitz. No small bowel or colonic dilatation within the visualized abdomen. Vascular/Lymphatic: No abdominal aortic aneurysm. Portal vein is patent although there is some mass-effect I the process in the head of pancreas. 12 mm short axis portal caval lymph node generate some mass-effect on the IVC. Celiac axis and SMA are patent and fat signal around each vessel appears preserved. Small aortocaval lymph nodes are evident on postcontrast imaging. Other:  No intraperitoneal free fluid. Musculoskeletal: No abnormal marrow enhancement within the visualized bony anatomy. IMPRESSION: 1. Marked intra and extrahepatic biliary duct dilatation with abrupt cut off of the common bile duct at the pancreatic head. This is associated with marked dilatation of the main pancreatic duct also with an abrupt cut off at the level of the pancreatic head. Pancreatic parenchyma in the body and tail of pancreas is markedly atrophic. A subtle 2.5 x 3 cm lesion with differential signal intensity is identified in the head of the pancreas which appears slightly hypoenhancing on postcontrast imaging. Together, these  features are highly concerning for pancreatic adenocarcinoma. EUS is recommended to further evaluate. 2. Portal vein is patent although there is some mass-effect on the portal splenic confluence and IVC. Fat planes around celiac axis and SMA appear preserved. 3. Upper normal to borderline portal caval lymph node associated with small right para-aortic lymphadenopathy. Electronically Signed  By: Misty Stanley M.D.   On: 10/30/2017 12:48   Dg Ercp Biliary & Pancreatic Ducts  Result Date: 10/31/2017 CLINICAL DATA:  ERCP with sphincterotomy and biliary stent placement EXAM: ERCP TECHNIQUE: Multiple spot images obtained with the fluoroscopic device and submitted for interpretation post-procedure. COMPARISON:  MRCP-10/30/2017 FLUOROSCOPY TIME:  2 minutes, 53 seconds FINDINGS: 6 spot fluoroscopic images of the right upper abdominal quadrant during ERCP are provided for review Initial image demonstrates an ERCP probe overlying the right upper abdominal quadrant. Subsequent images demonstrate selective cannulation and opacification of the common bile duct. There is an abrupt narrowing of the distal aspect of the CBD with apparent shouldering (images 3 and 4) Completion image demonstrates placement of a stent traversing this suspected location of CBD narrowing. IMPRESSION: ERCP with biliary stent placement as above. These images were submitted for radiologic interpretation only. Please see the procedural report for the amount of contrast and the fluoroscopy time utilized. Electronically Signed   By: Sandi Mariscal M.D.   On: 10/31/2017 12:23   Dg Abdomen Acute W/chest  Result Date: 11/08/2017 CLINICAL DATA:  Acute abdominal pain. EXAM: DG ABDOMEN ACUTE W/ 1V CHEST COMPARISON:  None. FINDINGS: Cardiomediastinal silhouette is unremarkable. The lungs are clear. No pleural effusion or pneumothorax. The bowel gas pattern is unremarkable. No dilated bowel loops are present. No evidence of pneumoperitoneum. A CBD stent is  identified. No acute bony abnormalities are present. Remote LEFT rib fractures are identified. IMPRESSION: 1. No evidence of acute abnormality. No evidence of bowel obstruction or pneumoperitoneum. 2. CBD stent. Electronically Signed   By: Margarette Canada M.D.   On: 11/08/2017 18:34   Mr Abdomen Mrcp Moise Boring Contast  Result Date: 10/30/2017 CLINICAL DATA:  Right upper quadrant pain and abnormal LFTs. EXAM: MRI ABDOMEN WITHOUT AND WITH CONTRAST (INCLUDING MRCP) TECHNIQUE: Multiplanar multisequence MR imaging of the abdomen was performed both before and after the administration of intravenous contrast. Heavily T2-weighted images of the biliary and pancreatic ducts were obtained, and three-dimensional MRCP images were rendered by post processing. CONTRAST:  7 cc Gadavist COMPARISON:  Ultrasound exam from earlier the same day FINDINGS: Lower chest: Unremarkable. Hepatobiliary: No focal mass lesion within the hepatic parenchyma. Gallbladder is nondistended and there may be some small stones cords the gallbladder neck. No substantial pericholecystic fluid or edema. There is marked intra and extrahepatic biliary duct dilatation. Common duct measures up to 16 mm diameter. The common duct abruptly terminates as it enters the pancreatic head. Pancreas: The pancreatic duct in the body and tail the pancreas is markedly dilated irregular, measuring up to 10 mm diameter. Pancreatic parenchyma in the body and tail of the pancreas is almost completely atrophic. Pancreatic duct abruptly terminates in the pancreatic neck region. Although very subtle, there appears to be a 3 x 2.5 cm area of abnormal signal intensity in the head of the pancreas against which both the common bile duct and the pancreatic duct terminate. Spleen:  No splenomegaly. No focal mass lesion. Adrenals/Urinary Tract: No adrenal nodule or mass. Kidneys are unremarkable. Stomach/Bowel: Stomach is nondistended. No gastric wall thickening. No evidence of outlet  obstruction. Duodenum is normally positioned as is the ligament of Treitz. No small bowel or colonic dilatation within the visualized abdomen. Vascular/Lymphatic: No abdominal aortic aneurysm. Portal vein is patent although there is some mass-effect I the process in the head of pancreas. 12 mm short axis portal caval lymph node generate some mass-effect on the IVC. Celiac axis and SMA are patent  and fat signal around each vessel appears preserved. Small aortocaval lymph nodes are evident on postcontrast imaging. Other:  No intraperitoneal free fluid. Musculoskeletal: No abnormal marrow enhancement within the visualized bony anatomy. IMPRESSION: 1. Marked intra and extrahepatic biliary duct dilatation with abrupt cut off of the common bile duct at the pancreatic head. This is associated with marked dilatation of the main pancreatic duct also with an abrupt cut off at the level of the pancreatic head. Pancreatic parenchyma in the body and tail of pancreas is markedly atrophic. A subtle 2.5 x 3 cm lesion with differential signal intensity is identified in the head of the pancreas which appears slightly hypoenhancing on postcontrast imaging. Together, these features are highly concerning for pancreatic adenocarcinoma. EUS is recommended to further evaluate. 2. Portal vein is patent although there is some mass-effect on the portal splenic confluence and IVC. Fat planes around celiac axis and SMA appear preserved. 3. Upper normal to borderline portal caval lymph node associated with small right para-aortic lymphadenopathy. Electronically Signed   By: Misty Stanley M.D.   On: 10/30/2017 12:48   [facility] Guided Port Insertion  Result Date: 11/15/2017 INDICATION: 47 year old female with a history of pancreatic cancer presenting for port catheter placement EXAM: IMPLANTED PORT A CATH PLACEMENT WITH ULTRASOUND AND FLUOROSCOPIC GUIDANCE MEDICATIONS: 2 g Ancef; The antibiotic was administered within an appropriate time  interval prior to skin puncture. ANESTHESIA/SEDATION: Moderate (conscious) sedation was employed during this procedure. A total of Versed 4.0 mg and Fentanyl 100 mcg was administered intravenously. Moderate Sedation Time: 15 minutes. The patient's level of consciousness and vital signs were monitored continuously by radiology nursing throughout the procedure under my direct supervision. FLUOROSCOPY TIME:  0 minutes, 6 seconds (1 mGy) COMPLICATIONS: None PROCEDURE: The procedure, risks, benefits, and alternatives were explained to the patient. Questions regarding the procedure were encouraged and answered. The patient understands and consents to the procedure. Ultrasound survey was performed with images stored and sent to PACs. The right neck and chest was prepped with chlorhexidine, and draped in the usual sterile fashion using maximum barrier technique (cap and mask, sterile gown, sterile gloves, large sterile sheet, hand hygiene and cutaneous antiseptic). Antibiotic prophylaxis was provided with 2.0g Ancef administered IV one hour prior to skin incision. Local anesthesia was attained by infiltration with 1% lidocaine without epinephrine. Ultrasound demonstrated patency of the right internal jugular vein, and this was documented with an image. Under real-time ultrasound guidance, this vein was accessed with a 21 gauge micropuncture needle and image documentation was performed. A small dermatotomy was made at the access site with an 11 scalpel. A 0.018" wire was advanced into the SVC and used to estimate the length of the internal catheter. The access needle exchanged for a 56F micropuncture vascular sheath. The 0.018" wire was then removed and a 0.035" wire advanced into the IVC. An appropriate location for the subcutaneous reservoir was selected below the clavicle and an incision was made through the skin and underlying soft tissues. The subcutaneous tissues were then dissected using a combination of blunt and  sharp surgical technique and a pocket was formed. A single lumen power injectable portacatheter was then tunneled through the subcutaneous tissues from the pocket to the dermatotomy and the port reservoir placed within the subcutaneous pocket. The venous access site was then serially dilated and a peel away vascular sheath placed over the wire. The wire was removed and the port catheter advanced into position under fluoroscopic guidance. The catheter tip is positioned  in the cavoatrial junction. This was documented with a spot image. The portacatheter was then tested and found to flush and aspirate well. The port was flushed with saline followed by 100 units/mL heparinized saline. The pocket was then closed in two layers using first subdermal inverted interrupted absorbable sutures followed by a running subcuticular suture. The epidermis was then sealed with Dermabond. The dermatotomy at the venous access site was also seal with Dermabond. Patient tolerated the procedure well and remained hemodynamically stable throughout. No complications encountered and no significant blood loss encountered IMPRESSION: Status post right IJ port catheter placement. Signed, Dulcy Fanny. Dellia Nims, RPVI [facility] Electronically Signed   By: Corrie Mckusick D.O.   On: 11/15/2017 09:28   US Abdomen Limited Ruq  Result Date: 10/30/2017 CLINICAL DATA:  Right upper quadrant pain for 5 weeks EXAM: ULTRASOUND ABDOMEN LIMITED RIGHT UPPER QUADRANT COMPARISON:  None. FINDINGS: Gallbladder: Gallbladder is contracted and filled with multiple stones and sludge. Gallbladder wall is thickened at 4 mm. Murphy's sign is negative. Common bile duct: Diameter: Prominent dilatation at 17.6 mm. No filling defects identified although the distal bile duct is obscured by bowel gas. Liver: No focal lesion identified. Within normal limits in parenchymal echogenicity. Portal vein is patent on color  Doppler imaging with normal direction of blood flow towards the liver. IMPRESSION: Contracted gallbladder filled with small stones and sludge. Gallbladder wall thickening. Changes may indicate cholecystitis in the appropriate clinical setting. Prominent dilatation of extrahepatic bile ducts. Consider obstruction versus choledochal cyst Electronically Signed   By: Lucienne Capers M.D.   On: 10/30/2017 06:31   CT Chest and Pelvis WO Contrast 11/15/17 IMPRESSION: 1. No findings of active malignancy in the chest or pelvis. 2. Aortic Atherosclerosis (ICD10-I70.0). Coronary atherosclerosis. 3. Faint centrilobular nodularity in the lung apices, raising the possibility of hypersensitivity pneumonitis or less likely respiratory bronchiolitis. Airway thickening is present, suggesting bronchitis or reactive airways disease.   ASSESSMENT & PLAN:  Jane Brooks is a 47 y.o. female with a history of H/o Gallstones and H/o Pneumothorax from prior trauma.,  Presented with jaundice and abdominal pain.   1. 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 She underwent stent placement by Dr. Lyndel Safe and her jaundice has much improved. -She underwent EUS with fine-needle biopsy of the pancreatic head mass, which showed adenocarcinoma. I reviewed with patient in detail.  Based on the EUS, the tumor did directly abuts the portal vein and SMV, likely borderline resectable. -Her abdominal MRI showed no evidence of nodal or distant metastasis.  Her CT chest and pelvis from 11/15/17 did not show evidence of metastasis. I discussed with her  -I previously discussed standard of care for pancreatic cancer includes an extensive Whipple Surgery. I discussed In her case her tumor is close to blood vessels, so her tumor is probably borderline  resectable -I recommend neoadjuvant chemotherapy to shrink her tumor to make it easier to resect. She may or may not require neoadjuvant Radiation. This will be determined after her neoadjuvant chemotherapy. -She had her port placed on 11/15/17 as she will be on long term treatment.  -I previously referred her to Dr. Barry Dienes, due to her uninsured status, her surgical appointment is pending at this point.  I discussed the referral to [facility], think about it.  If her Medicaid gets approved in the next few months, she will probably see Dr. Barry Dienes at the beginning of 2020. -Labs reviewed, CBC showed Hg 10.9 MCV 65.8. CMP WNLs. CA 19.9 pending. 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  -Will proceed for cycle neoadjuvant modified FOLFIRINOX today, with Udenyca on day 3.  She knows to use Claritin daily for 5 days after the injection -f/u with Lacie in 2 weeks    2. Abdominal pain, secondary to #1  -Currently alternating Norco 1 tabs q6hours and Percocet 2 tabs q4hours as needed.  -She was prescribed Vicodin last week by Dr. Ardis Hughs, but was not able to fill due to finances. I asked her to return the prescription to me  -I refilled morphine at [facility] for her on 11/09/17, she will use as needed    3. Social and [place] from [place] to [place] alone 3 years ago away from all of her family. She has little to no family support -Recently lost her job in 06/2017 and is currently looking for a new job -She lives on a friend's couch. That friend is wheelchair bound.  -She is currently on Medicaid and is planning to apply for disability. She is on food stamp at $149/monthly.  -I previously reviewed our resources that are available to her including transportation and possible free medication.  -She will meet with one of our social workers ASAP  4. 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   All questions were answered. The patient knows to call the clinic with any problems, questions or concerns. I spent 25 minutes counseling the patient face to face. The total time spent in the appointment was 30 minutes and more than 50% was on counseling.  Dierdre Searles Dweik am acting as scribe for Dr. Truitt Merle.  I have reviewed the above documentation for accuracy and completeness, and I agree with the above.     Truitt Merle, MD 11/19/2017 5:46 PM

## 2017-11-19 NOTE — Patient Instructions (Signed)
[facility] Discharge Instructions for Patients Receiving Chemotherapy  Today you received the following chemotherapy agents Oxaliplatin (Eloxatin), Leucovorin, Irinotecan (Camptosar) & Fluorouracil (Adrucil).  To help prevent nausea and vomiting after your treatment, we encourage you to take your nausea medication as prescribed.   If you develop nausea and vomiting that is not controlled by your nausea medication, call the clinic.   BELOW ARE SYMPTOMS THAT SHOULD BE REPORTED IMMEDIATELY:  *FEVER GREATER THAN 100.5 F  *CHILLS WITH OR WITHOUT FEVER  NAUSEA AND VOMITING THAT IS NOT CONTROLLED WITH YOUR NAUSEA MEDICATION  *UNUSUAL SHORTNESS OF BREATH  *UNUSUAL BRUISING OR BLEEDING  TENDERNESS IN MOUTH AND THROAT WITH OR WITHOUT PRESENCE OF ULCERS  *URINARY PROBLEMS  *BOWEL PROBLEMS  UNUSUAL RASH Items with * indicate a potential emergency and should be followed up as soon as possible.  Feel free to call the clinic should you have any questions or concerns. The clinic phone number is [phone].  Please show the [place] at check-in to the Emergency Department and triage nurse.  Oxaliplatin Injection What is this medicine? OXALIPLATIN (ox AL i PLA tin) is a chemotherapy drug. It targets fast dividing cells, like cancer cells, and causes these cells to die. This medicine is used to treat cancers of the colon and rectum, and many other cancers. This medicine may be used for other purposes; ask your health care provider or pharmacist if you have questions. COMMON BRAND NAME(S): Eloxatin What should I tell my health care provider before I take this medicine? They need to know if you have any of these conditions: -kidney disease -an unusual or allergic reaction to oxaliplatin, other chemotherapy, other medicines, foods, dyes, or preservatives -pregnant or trying to get pregnant -breast-feeding How should I use this medicine? This drug is given as an  infusion into a vein. It is administered in a hospital or clinic by a specially trained health care professional. Talk to your pediatrician regarding the use of this medicine in children. Special care may be needed. Overdosage: If you think you have taken too much of this medicine contact a poison control center or emergency room at once. NOTE: This medicine is only for you. Do not share this medicine with others. What if I miss a dose? It is important not to miss a dose. Call your doctor or health care professional if you are unable to keep an appointment. What may interact with this medicine? -medicines to increase blood counts like filgrastim, pegfilgrastim, sargramostim -probenecid -some antibiotics like amikacin, gentamicin, neomycin, polymyxin B, streptomycin, tobramycin -zalcitabine Talk to your doctor or health care professional before taking any of these medicines: -acetaminophen -aspirin -ibuprofen -ketoprofen -naproxen This list may not describe all possible interactions. Give your health care provider a list of all the medicines, herbs, non-prescription drugs, or dietary supplements you use. Also tell them if you smoke, drink alcohol, or use illegal drugs. Some items may interact with your medicine. What should I watch for while using this medicine? Your condition will be monitored carefully while you are receiving this medicine. You will need important blood work done while you are taking this medicine. This medicine can make you more sensitive to cold. Do not drink cold drinks or use ice. Cover exposed skin before coming in contact with cold temperatures or cold objects. When out in cold weather wear warm clothing and cover your mouth and nose to warm the air that goes into your lungs. Tell your doctor if you get  sensitive to the cold. This drug may make you feel generally unwell. This is not uncommon, as chemotherapy can affect healthy cells as well as cancer cells. Report any  side effects. Continue your course of treatment even though you feel ill unless your doctor tells you to stop. In some cases, you may be given additional medicines to help with side effects. Follow all directions for their use. Call your doctor or health care professional for advice if you get a fever, chills or sore throat, or other symptoms of a cold or flu. Do not treat yourself. This drug decreases your body's ability to fight infections. Try to avoid being around people who are sick. This medicine may increase your risk to bruise or bleed. Call your doctor or health care professional if you notice any unusual bleeding. Be careful brushing and flossing your teeth or using a toothpick because you may get an infection or bleed more easily. If you have any dental work done, tell your dentist you are receiving this medicine. Avoid taking products that contain aspirin, acetaminophen, ibuprofen, naproxen, or ketoprofen unless instructed by your doctor. These medicines may hide a fever. Do not become pregnant while taking this medicine. Women should inform their doctor if they wish to become pregnant or think they might be pregnant. There is a potential for serious side effects to an unborn child. Talk to your health care professional or pharmacist for more information. Do not breast-feed an infant while taking this medicine. Call your doctor or health care professional if you get diarrhea. Do not treat yourself. What side effects may I notice from receiving this medicine? Side effects that you should report to your doctor or health care professional as soon as possible: -allergic reactions like skin rash, itching or hives, swelling of the face, lips, or tongue -low blood counts - This drug may decrease the number of white blood cells, red blood cells and platelets. You may be at increased risk for infections and bleeding. -signs of infection - fever or chills, cough, sore throat, pain or difficulty passing  urine -signs of decreased platelets or bleeding - bruising, pinpoint red spots on the skin, black, tarry stools, nosebleeds -signs of decreased red blood cells - unusually weak or tired, fainting spells, lightheadedness -breathing problems -chest pain, pressure -cough -diarrhea -jaw tightness -mouth sores -nausea and vomiting -pain, swelling, redness or irritation at the injection site -pain, tingling, numbness in the hands or feet -problems with balance, talking, walking -redness, blistering, peeling or loosening of the skin, including inside the mouth -trouble passing urine or change in the amount of urine Side effects that usually do not require medical attention (report to your doctor or health care professional if they continue or are bothersome): -changes in vision -constipation -hair loss -loss of appetite -metallic taste in the mouth or changes in taste -stomach pain This list may not describe all possible side effects. Call your doctor for medical advice about side effects. You may report side effects to FDA at 1-800-FDA-1088. Where should I keep my medicine? This drug is given in a hospital or clinic and will not be stored at home. NOTE: This sheet is a summary. It may not cover all possible information. If you have questions about this medicine, talk to your doctor, pharmacist, or health care provider.  2018 Elsevier/Gold Standard (2007-07-30 17:22:47)  Leucovorin injection What is this medicine? LEUCOVORIN (loo koe VOR in) is used to prevent or treat the harmful effects of some medicines. This medicine  is used to treat anemia caused by a low amount of folic acid in the body. It is also used with 5-fluorouracil (5-FU) to treat colon cancer. This medicine may be used for other purposes; ask your health care provider or pharmacist if you have questions. What should I tell my health care provider before I take this medicine? They need to know if you have any of these  conditions: -anemia from low levels of vitamin B-12 in the blood -an unusual or allergic reaction to leucovorin, folic acid, other medicines, foods, dyes, or preservatives -pregnant or trying to get pregnant -breast-feeding How should I use this medicine? This medicine is for injection into a muscle or into a vein. It is given by a health care professional in a hospital or clinic setting. Talk to your pediatrician regarding the use of this medicine in children. Special care may be needed. Overdosage: If you think you have taken too much of this medicine contact a poison control center or emergency room at once. NOTE: This medicine is only for you. Do not share this medicine with others. What if I miss a dose? This does not apply. What may interact with this medicine? -capecitabine -fluorouracil -phenobarbital -phenytoin -primidone -trimethoprim-sulfamethoxazole This list may not describe all possible interactions. Give your health care provider a list of all the medicines, herbs, non-prescription drugs, or dietary supplements you use. Also tell them if you smoke, drink alcohol, or use illegal drugs. Some items may interact with your medicine. What should I watch for while using this medicine? Your condition will be monitored carefully while you are receiving this medicine. This medicine may increase the side effects of 5-fluorouracil, 5-FU. Tell your doctor or health care professional if you have diarrhea or mouth sores that do not get better or that get worse. What side effects may I notice from receiving this medicine? Side effects that you should report to your doctor or health care professional as soon as possible: -allergic reactions like skin rash, itching or hives, swelling of the face, lips, or tongue -breathing problems -fever, infection -mouth sores -unusual bleeding or bruising -unusually weak or tired Side effects that usually do not require medical attention (report to  your doctor or health care professional if they continue or are bothersome): -constipation or diarrhea -loss of appetite -nausea, vomiting This list may not describe all possible side effects. Call your doctor for medical advice about side effects. You may report side effects to FDA at 1-800-FDA-1088. Where should I keep my medicine? This drug is given in a hospital or clinic and will not be stored at home. NOTE: This sheet is a summary. It may not cover all possible information. If you have questions about this medicine, talk to your doctor, pharmacist, or health care provider.  2018 Elsevier/Gold Standard (2007-07-09 16:50:29)  Irinotecan injection What is this medicine? IRINOTECAN (ir in oh TEE kan ) is a chemotherapy drug. It is used to treat colon and rectal cancer. This medicine may be used for other purposes; ask your health care provider or pharmacist if you have questions. COMMON BRAND NAME(S): Camptosar What should I tell my health care provider before I take this medicine? They need to know if you have any of these conditions: -blood disorders -dehydration -diarrhea -infection (especially a virus infection such as chickenpox, cold sores, or herpes) -liver disease -low blood counts, like low white cell, platelet, or red cell counts -recent or ongoing radiation therapy -an unusual or allergic reaction to irinotecan, sorbitol, other  chemotherapy, other medicines, foods, dyes, or preservatives -pregnant or trying to get pregnant -breast-feeding How should I use this medicine? This drug is given as an infusion into a vein. It is administered in a hospital or clinic by a specially trained health care professional. Talk to your pediatrician regarding the use of this medicine in children. Special care may be needed. Overdosage: If you think you have taken too much of this medicine contact a poison control center or emergency room at once. NOTE: This medicine is only for you. Do not  share this medicine with others. What if I miss a dose? It is important not to miss your dose. Call your doctor or health care professional if you are unable to keep an appointment. What may interact with this medicine? Do not take this medicine with any of the following medications: -atazanavir -certain medicines for fungal infections like itraconazole and ketoconazole -St. John's Wort This medicine may also interact with the following medications: -dexamethasone -diuretics -laxatives -medicines for seizures like carbamazepine, mephobarbital, phenobarbital, phenytoin, primidone -medicines to increase blood counts like filgrastim, pegfilgrastim, sargramostim -prochlorperazine -vaccines This list may not describe all possible interactions. Give your health care provider a list of all the medicines, herbs, non-prescription drugs, or dietary supplements you use. Also tell them if you smoke, drink alcohol, or use illegal drugs. Some items may interact with your medicine. What should I watch for while using this medicine? Your condition will be monitored carefully while you are receiving this medicine. You will need important blood work done while you are taking this medicine. This drug may make you feel generally unwell. This is not uncommon, as chemotherapy can affect healthy cells as well as cancer cells. Report any side effects. Continue your course of treatment even though you feel ill unless your doctor tells you to stop. In some cases, you may be given additional medicines to help with side effects. Follow all directions for their use. You may get drowsy or dizzy. Do not drive, use machinery, or do anything that needs mental alertness until you know how this medicine affects you. Do not stand or sit up quickly, especially if you are an older patient. This reduces the risk of dizzy or fainting spells. Call your doctor or health care professional for advice if you get a fever, chills or sore  throat, or other symptoms of a cold or flu. Do not treat yourself. This drug decreases your body's ability to fight infections. Try to avoid being around people who are sick. This medicine may increase your risk to bruise or bleed. Call your doctor or health care professional if you notice any unusual bleeding. Be careful brushing and flossing your teeth or using a toothpick because you may get an infection or bleed more easily. If you have any dental work done, tell your dentist you are receiving this medicine. Avoid taking products that contain aspirin, acetaminophen, ibuprofen, naproxen, or ketoprofen unless instructed by your doctor. These medicines may hide a fever. Do not become pregnant while taking this medicine. Women should inform their doctor if they wish to become pregnant or think they might be pregnant. There is a potential for serious side effects to an unborn child. Talk to your health care professional or pharmacist for more information. Do not breast-feed an infant while taking this medicine. What side effects may I notice from receiving this medicine? Side effects that you should report to your doctor or health care professional as soon as possible: -allergic reactions like  skin rash, itching or hives, swelling of the face, lips, or tongue -low blood counts - this medicine may decrease the number of white blood cells, red blood cells and platelets. You may be at increased risk for infections and bleeding. -signs of infection - fever or chills, cough, sore throat, pain or difficulty passing urine -signs of decreased platelets or bleeding - bruising, pinpoint red spots on the skin, black, tarry stools, blood in the urine -signs of decreased red blood cells - unusually weak or tired, fainting spells, lightheadedness -breathing problems -chest pain -diarrhea -feeling faint or lightheaded, falls -flushing, runny nose, sweating during infusion -mouth sores or pain -pain, swelling,  redness or irritation where injected -pain, swelling, warmth in the leg -pain, tingling, numbness in the hands or feet -problems with balance, talking, walking -stomach cramps, pain -trouble passing urine or change in the amount of urine -vomiting as to be unable to hold down drinks or food -yellowing of the eyes or skin Side effects that usually do not require medical attention (report to your doctor or health care professional if they continue or are bothersome): -constipation -hair loss -headache -loss of appetite -nausea, vomiting -stomach upset This list may not describe all possible side effects. Call your doctor for medical advice about side effects. You may report side effects to FDA at 1-800-FDA-1088. Where should I keep my medicine? This drug is given in a hospital or clinic and will not be stored at home. NOTE: This sheet is a summary. It may not cover all possible information. If you have questions about this medicine, talk to your doctor, pharmacist, or health care provider.  2018 Elsevier/Gold Standard (2012-07-01 16:29:32)  Fluorouracil, 5-FU injection What is this medicine? FLUOROURACIL, 5-FU (flure oh YOOR a sil) is a chemotherapy drug. It slows the growth of cancer cells. This medicine is used to treat many types of cancer like breast cancer, colon or rectal cancer, pancreatic cancer, and stomach cancer. This medicine may be used for other purposes; ask your health care provider or pharmacist if you have questions. COMMON BRAND NAME(S): Adrucil What should I tell my health care provider before I take this medicine? They need to know if you have any of these conditions: -blood disorders -dihydropyrimidine dehydrogenase (DPD) deficiency -infection (especially a virus infection such as chickenpox, cold sores, or herpes) -kidney disease -liver disease -malnourished, poor nutrition -recent or ongoing radiation therapy -an unusual or allergic reaction to fluorouracil,  other chemotherapy, other medicines, foods, dyes, or preservatives -pregnant or trying to get pregnant -breast-feeding How should I use this medicine? This drug is given as an infusion or injection into a vein. It is administered in a hospital or clinic by a specially trained health care professional. Talk to your pediatrician regarding the use of this medicine in children. Special care may be needed. Overdosage: If you think you have taken too much of this medicine contact a poison control center or emergency room at once. NOTE: This medicine is only for you. Do not share this medicine with others. What if I miss a dose? It is important not to miss your dose. Call your doctor or health care professional if you are unable to keep an appointment. What may interact with this medicine? -allopurinol -cimetidine -dapsone -digoxin -hydroxyurea -leucovorin -levamisole -medicines for seizures like ethotoin, fosphenytoin, phenytoin -medicines to increase blood counts like filgrastim, pegfilgrastim, sargramostim -medicines that treat or prevent blood clots like warfarin, enoxaparin, and dalteparin -methotrexate -metronidazole -pyrimethamine -some other chemotherapy drugs like busulfan, cisplatin,  estramustine, vinblastine -trimethoprim -trimetrexate -vaccines Talk to your doctor or health care professional before taking any of these medicines: -acetaminophen -aspirin -ibuprofen -ketoprofen -naproxen This list may not describe all possible interactions. Give your health care provider a list of all the medicines, herbs, non-prescription drugs, or dietary supplements you use. Also tell them if you smoke, drink alcohol, or use illegal drugs. Some items may interact with your medicine. What should I watch for while using this medicine? Visit your doctor for checks on your progress. This drug may make you feel generally unwell. This is not uncommon, as chemotherapy can affect healthy cells as  well as cancer cells. Report any side effects. Continue your course of treatment even though you feel ill unless your doctor tells you to stop. In some cases, you may be given additional medicines to help with side effects. Follow all directions for their use. Call your doctor or health care professional for advice if you get a fever, chills or sore throat, or other symptoms of a cold or flu. Do not treat yourself. This drug decreases your body's ability to fight infections. Try to avoid being around people who are sick. This medicine may increase your risk to bruise or bleed. Call your doctor or health care professional if you notice any unusual bleeding. Be careful brushing and flossing your teeth or using a toothpick because you may get an infection or bleed more easily. If you have any dental work done, tell your dentist you are receiving this medicine. Avoid taking products that contain aspirin, acetaminophen, ibuprofen, naproxen, or ketoprofen unless instructed by your doctor. These medicines may hide a fever. Do not become pregnant while taking this medicine. Women should inform their doctor if they wish to become pregnant or think they might be pregnant. There is a potential for serious side effects to an unborn child. Talk to your health care professional or pharmacist for more information. Do not breast-feed an infant while taking this medicine. Men should inform their doctor if they wish to father a child. This medicine may lower sperm counts. Do not treat diarrhea with over the counter products. Contact your doctor if you have diarrhea that lasts more than 2 days or if it is severe and watery. This medicine can make you more sensitive to the sun. Keep out of the sun. If you cannot avoid being in the sun, wear protective clothing and use sunscreen. Do not use sun lamps or tanning beds/booths. What side effects may I notice from receiving this medicine? Side effects that you should report to  your doctor or health care professional as soon as possible: -allergic reactions like skin rash, itching or hives, swelling of the face, lips, or tongue -low blood counts - this medicine may decrease the number of white blood cells, red blood cells and platelets. You may be at increased risk for infections and bleeding. -signs of infection - fever or chills, cough, sore throat, pain or difficulty passing urine -signs of decreased platelets or bleeding - bruising, pinpoint red spots on the skin, black, tarry stools, blood in the urine -signs of decreased red blood cells - unusually weak or tired, fainting spells, lightheadedness -breathing problems -changes in vision -chest pain -mouth sores -nausea and vomiting -pain, swelling, redness at site where injected -pain, tingling, numbness in the hands or feet -redness, swelling, or sores on hands or feet -stomach pain -unusual bleeding Side effects that usually do not require medical attention (report to your doctor or health care professional  if they continue or are bothersome): -changes in finger or toe nails -diarrhea -dry or itchy skin -hair loss -headache -loss of appetite -sensitivity of eyes to the light -stomach upset -unusually teary eyes This list may not describe all possible side effects. Call your doctor for medical advice about side effects. You may report side effects to FDA at 1-800-FDA-1088. Where should I keep my medicine? This drug is given in a hospital or clinic and will not be stored at home. NOTE: This sheet is a summary. It may not cover all possible information. If you have questions about this medicine, talk to your doctor, pharmacist, or health care provider.  2018 Elsevier/Gold Standard (2007-05-08 13:53:16)

## 2017-11-19 NOTE — Progress Notes (Signed)
Patient c/o stomach fullness and cramping during administration of Irinotecan.  Medication was paused, NS administered per protocol.  VS obtained, WNL.  Atropine 0.57m administered, per orders.  Cramping and fullness subsided.  Will continue to monitor.

## 2017-11-19 NOTE — Progress Notes (Signed)
The patient is approved for drug assistance by [employer] for [employer].  Enrollment is effective until 11/17/18 and is based on self pay. Drug replacement will begin DOS 11/21/17.

## 2017-11-20 ENCOUNTER — Telehealth: Payer: Self-pay | Admitting: *Deleted

## 2017-11-20 LAB — CANCER ANTIGEN 19-9: CAN 19-9: 112 U/mL — AB (ref 0–35)

## 2017-11-20 NOTE — Telephone Encounter (Signed)
Pt left a message stating Dr Burr Medico had talked with her about having imodium AD on hand if she develops diarrhea. Needs that to be sent to [facility] as a prescription. She has funding through [employer].

## 2017-11-20 NOTE — Telephone Encounter (Signed)
Pt states she did well post chemo. Is eating and drinking well . Had been constipated, but bowels are moving OK now. No questions or concerns

## 2017-11-21 ENCOUNTER — Inpatient Hospital Stay: Payer: Medicaid Other

## 2017-11-21 ENCOUNTER — Other Ambulatory Visit: Payer: Self-pay | Admitting: Hematology

## 2017-11-21 VITALS — BP 122/77 | HR 80 | Temp 98.2°F | Resp 16

## 2017-11-21 DIAGNOSIS — Z5111 Encounter for antineoplastic chemotherapy: Secondary | ICD-10-CM | POA: Diagnosis not present

## 2017-11-21 DIAGNOSIS — C250 Malignant neoplasm of head of pancreas: Secondary | ICD-10-CM

## 2017-11-21 MED ORDER — PEGFILGRASTIM-CBQV 6 MG/0.6ML ~~LOC~~ SOSY
6.0000 mg | PREFILLED_SYRINGE | Freq: Once | SUBCUTANEOUS | Status: AC
  Administered 2017-11-21: 6 mg via SUBCUTANEOUS

## 2017-11-21 MED ORDER — HEPARIN SOD (PORK) LOCK FLUSH 100 UNIT/ML IV SOLN
500.0000 [IU] | Freq: Once | INTRAVENOUS | Status: AC | PRN
  Administered 2017-11-21: 500 [IU]
  Filled 2017-11-21: qty 5

## 2017-11-21 MED ORDER — PEGFILGRASTIM-CBQV 6 MG/0.6ML ~~LOC~~ SOSY
PREFILLED_SYRINGE | SUBCUTANEOUS | Status: AC
  Filled 2017-11-21: qty 0.6

## 2017-11-21 MED ORDER — LOPERAMIDE HCL 2 MG PO CAPS: 4.0000 mg | ORAL_CAPSULE | Freq: Four times a day (QID) | ORAL | 0 refills | Status: DC | PRN

## 2017-11-21 MED ORDER — SODIUM CHLORIDE 0.9% FLUSH
10.0000 mL | INTRAVENOUS | Status: DC | PRN
  Administered 2017-11-21: 10 mL
  Filled 2017-11-21: qty 10

## 2017-11-21 MED FILL — ANTI-DIARRHEAL 2 MG CAPLET: 2 | 7 days supply | Qty: 60 | Fill #0

## 2017-11-21 NOTE — Telephone Encounter (Signed)
Done, thanks  Truitt Merle MD

## 2017-11-21 NOTE — Patient Instructions (Addendum)
Pegfilgrastim injection What is this medicine? PEGFILGRASTIM (PEG fil gra stim) is a long-acting granulocyte colony-stimulating factor that stimulates the growth of neutrophils, a type of white blood cell important in the body's fight against infection. It is used to reduce the incidence of fever and infection in patients with certain types of cancer who are receiving chemotherapy that affects the bone marrow, and to increase survival after being exposed to high doses of radiation. This medicine may be used for other purposes; ask your health care provider or pharmacist if you have questions. COMMON BRAND NAME(S): Neulasta,Udenyca  What should I tell my health care provider before I take this medicine? They need to know if you have any of these conditions: -kidney disease -latex allergy -ongoing radiation therapy -sickle cell disease -skin reactions to acrylic adhesives (On-Body Injector only) -an unusual or allergic reaction to pegfilgrastim, filgrastim, other medicines, foods, dyes, or preservatives -pregnant or trying to get pregnant -breast-feeding How should I use this medicine? This medicine is for injection under the skin. If you get this medicine at home, you will be taught how to prepare and give the pre-filled syringe or how to use the On-body Injector. Refer to the patient Instructions for Use for detailed instructions. Use exactly as directed. Tell your healthcare provider immediately if you suspect that the On-body Injector may not have performed as intended or if you suspect the use of the On-body Injector resulted in a missed or partial dose. It is important that you put your used needles and syringes in a special sharps container. Do not put them in a trash can. If you do not have a sharps container, call your pharmacist or healthcare provider to get one. Talk to your pediatrician regarding the use of this medicine in children. While this drug may be prescribed for selected  conditions, precautions do apply. Overdosage: If you think you have taken too much of this medicine contact a poison control center or emergency room at once. NOTE: This medicine is only for you. Do not share this medicine with others. What if I miss a dose? It is important not to miss your dose. Call your doctor or health care professional if you miss your dose. If you miss a dose due to an On-body Injector failure or leakage, a new dose should be administered as soon as possible using a single prefilled syringe for manual use. What may interact with this medicine? Interactions have not been studied. Give your health care provider a list of all the medicines, herbs, non-prescription drugs, or dietary supplements you use. Also tell them if you smoke, drink alcohol, or use illegal drugs. Some items may interact with your medicine. This list may not describe all possible interactions. Give your health care provider a list of all the medicines, herbs, non-prescription drugs, or dietary supplements you use. Also tell them if you smoke, drink alcohol, or use illegal drugs. Some items may interact with your medicine. What should I watch for while using this medicine? You may need blood work done while you are taking this medicine. If you are going to need a MRI, CT scan, or other procedure, tell your doctor that you are using this medicine (On-Body Injector only). What side effects may I notice from receiving this medicine? Side effects that you should report to your doctor or health care professional as soon as possible: -allergic reactions like skin rash, itching or hives, swelling of the face, lips, or tongue -dizziness -fever -pain, redness, or irritation at  site where injected -pinpoint red spots on the skin -red or dark-brown urine -shortness of breath or breathing problems -stomach or side pain, or pain at the shoulder -swelling -tiredness -trouble passing urine or change in the amount of  urine Side effects that usually do not require medical attention (report to your doctor or health care professional if they continue or are bothersome): -bone pain -muscle pain This list may not describe all possible side effects. Call your doctor for medical advice about side effects. You may report side effects to FDA at 1-800-FDA-1088. Where should I keep my medicine? Keep out of the reach of children. Store pre-filled syringes in a refrigerator between 2 and 8 degrees C (36 and 46 degrees F). Do not freeze. Keep in carton to protect from light. Throw away this medicine if it is left out of the refrigerator for more than 48 hours. Throw away any unused medicine after the expiration date. NOTE: This sheet is a summary. It may not cover all possible information. If you have questions about this medicine, talk to your doctor, pharmacist, or health care provider.  2018 Elsevier/Gold Standard (2015-12-30 12:58:03)

## 2017-11-26 ENCOUNTER — Encounter (HOSPITAL_COMMUNITY): Payer: Self-pay | Admitting: Emergency Medicine

## 2017-11-26 ENCOUNTER — Emergency Department (HOSPITAL_COMMUNITY)
Admission: EM | Admit: 2017-11-26 | Discharge: 2017-11-26 | Disposition: A | Discharge status: Home / Self Care | Payer: Medicaid Other | Attending: Emergency Medicine | Admitting: Emergency Medicine

## 2017-11-26 ENCOUNTER — Other Ambulatory Visit: Payer: Self-pay

## 2017-11-26 ENCOUNTER — Telehealth: Payer: Self-pay | Admitting: Hematology

## 2017-11-26 ENCOUNTER — Other Ambulatory Visit: Payer: Self-pay | Admitting: Hematology

## 2017-11-26 ENCOUNTER — Telehealth: Payer: Self-pay

## 2017-11-26 DIAGNOSIS — Z79899 Other long term (current) drug therapy: Secondary | ICD-10-CM | POA: Diagnosis not present

## 2017-11-26 DIAGNOSIS — R109 Unspecified abdominal pain: Secondary | ICD-10-CM

## 2017-11-26 DIAGNOSIS — Z590 Homelessness: Secondary | ICD-10-CM | POA: Insufficient documentation

## 2017-11-26 DIAGNOSIS — F17200 Nicotine dependence, unspecified, uncomplicated: Secondary | ICD-10-CM | POA: Diagnosis not present

## 2017-11-26 LAB — CBC WITH DIFFERENTIAL/PLATELET
Abs Immature Granulocytes: 1.1 10*3/uL — ABNORMAL HIGH (ref 0.00–0.07)
Basophils Absolute: 0.2 10*3/uL — ABNORMAL HIGH (ref 0.0–0.1)
Basophils Relative: 1 %
Eosinophils Absolute: 0.7 10*3/uL — ABNORMAL HIGH (ref 0.0–0.5)
Eosinophils Relative: 2 %
HCT: 40.3 % (ref 36.0–46.0)
Hemoglobin: 12.3 g/dL (ref 12.0–15.0)
Immature Granulocytes: 3 %
Lymphocytes Relative: 8 %
Lymphs Abs: 2.8 10*3/uL (ref 0.7–4.0)
MCH: 20.3 pg — ABNORMAL LOW (ref 26.0–34.0)
MCHC: 30.5 g/dL (ref 30.0–36.0)
MCV: 66.5 fL — ABNORMAL LOW (ref 80.0–100.0)
Monocytes Absolute: 2.7 10*3/uL — ABNORMAL HIGH (ref 0.1–1.0)
Monocytes Relative: 8 %
Neutro Abs: 28.1 10*3/uL — ABNORMAL HIGH (ref 1.7–7.7)
Neutrophils Relative %: 78 %
Platelets: 255 10*3/uL (ref 150–400)
RBC: 6.06 MIL/uL — ABNORMAL HIGH (ref 3.87–5.11)
RDW: 14.9 % (ref 11.5–15.5)
WBC: 35.6 10*3/uL — ABNORMAL HIGH (ref 4.0–10.5)
nRBC: 0 % (ref 0.0–0.2)

## 2017-11-26 LAB — COMPREHENSIVE METABOLIC PANEL
ALBUMIN: 4 g/dL (ref 3.5–5.0)
ALK PHOS: 198 U/L — AB (ref 38–126)
ALT: 25 U/L (ref 0–44)
AST: 26 U/L (ref 15–41)
Anion gap: 11 (ref 5–15)
BILIRUBIN TOTAL: 0.8 mg/dL (ref 0.3–1.2)
BUN: 7 mg/dL (ref 6–20)
CALCIUM: 9.5 mg/dL (ref 8.9–10.3)
CO2: 24 mmol/L (ref 22–32)
Chloride: 103 mmol/L (ref 98–111)
Creatinine, Ser: 0.69 mg/dL (ref 0.44–1.00)
GFR calc Af Amer: >60 mL/min (ref >60)
GFR calc non Af Amer: >60 mL/min (ref >60)
GLUCOSE: 64 mg/dL — AB (ref 70–99)
POTASSIUM: 4.1 mmol/L (ref 3.5–5.1)
Sodium: 138 mmol/L (ref 135–145)
TOTAL PROTEIN: 7.7 g/dL (ref 6.5–8.1)

## 2017-11-26 LAB — URINALYSIS, ROUTINE W REFLEX MICROSCOPIC
Bilirubin Urine: NEGATIVE
Glucose, UA: NEGATIVE mg/dL
Hgb urine dipstick: NEGATIVE
Ketones, ur: 20 mg/dL — AB
Leukocytes, UA: NEGATIVE
Nitrite: NEGATIVE
Protein, ur: NEGATIVE mg/dL
Specific Gravity, Urine: 1.017 (ref 1.005–1.030)
pH: 5 (ref 5.0–8.0)

## 2017-11-26 LAB — RAPID URINE DRUG SCREEN, HOSP PERFORMED
AMPHETAMINES: NOT DETECTED
BARBITURATES: NOT DETECTED
BENZODIAZEPINES: NOT DETECTED
Cocaine: NOT DETECTED
Opiates: POSITIVE — AB
TETRAHYDROCANNABINOL: NOT DETECTED

## 2017-11-26 LAB — LIPASE, BLOOD: Lipase: 37 U/L (ref 11–51)

## 2017-11-26 MED ORDER — OXYCODONE-ACETAMINOPHEN 5-325 MG PO TABS
2.0000 | ORAL_TABLET | Freq: Once | ORAL | Status: AC
  Administered 2017-11-26: 2 via ORAL
  Filled 2017-11-26: qty 2

## 2017-11-26 MED ORDER — ONDANSETRON HCL 4 MG/2ML IJ SOLN
4.0000 mg | Freq: Once | INTRAMUSCULAR | Status: AC
  Administered 2017-11-26: 4 mg via INTRAVENOUS
  Filled 2017-11-26: qty 2

## 2017-11-26 MED ORDER — DICYCLOMINE HCL 10 MG PO CAPS
10.0000 mg | ORAL_CAPSULE | Freq: Once | ORAL | Status: AC
  Administered 2017-11-26: 10 mg via ORAL
  Filled 2017-11-26: qty 1

## 2017-11-26 MED ORDER — MORPHINE SULFATE 15 MG PO TABS: 7.5000 mg | ORAL_TABLET | Freq: Four times a day (QID) | ORAL | 0 refills | Status: DC | PRN

## 2017-11-26 MED ORDER — MORPHINE SULFATE (PF) 4 MG/ML IV SOLN
4.0000 mg | Freq: Once | INTRAVENOUS | Status: AC
  Administered 2017-11-26: 4 mg via INTRAVENOUS
  Filled 2017-11-26: qty 1

## 2017-11-26 MED FILL — MORPHINE SULFATE IR 15 MG T: 15 | 10 days supply | Qty: 20 | Fill #0

## 2017-11-26 NOTE — ED Triage Notes (Signed)
Pt here for abd pain that intensified  last night at 2200. Pt reports constant pain secondary to pancreatic cancer.

## 2017-11-26 NOTE — ED Provider Notes (Signed)
[facility] EMERGENCY DEPARTMENT Provider Note   CSN: [id number] Arrival date & time: 11/26/17  0057     History   Chief Complaint Chief Complaint  Patient presents with  . Abdominal Pain    HPI Jane Brooks is a 47 y.o. female.  47yo F w/ PMH including pancreatic cancer who p/w abd pain. She reports that she has a degree of constant abdominal pain ever since she had a biliary stent placed. This evening around 10pm, she had a sudden worsening of her abd pain, which she describes as stomach spasm pain in epigastrium that is intermittent, severe. She ate better than usual today and had a loose BM after pain started but it did not improve the pain. She vomited after arrival to ED which slightly improved the pain but she continues to have spasms. She reports she is currently homeless. She was staying with someone and her pain medication disappeared 2 days ago. She has not had any of the medication today. She was previously taking it as prescribed, q 4-6 hours.   No fevers, urinary symptoms, or cough.   The history is provided by the patient.  Abdominal Pain      Past Medical History:  Diagnosis Date  . Gallstones 10/2017  . Pancreatic cancer ([place])   . Pneumothorax, closed, traumatic    years ago    Patient Active Problem List   Diagnosis Date Noted  . Pancreatic cancer ([place]) 11/09/2017  . Pancreatic mass   . Cholecystitis 10/30/2017  . Jaundice 10/30/2017    Past Surgical History:  Procedure Laterality Date  . BILIARY STENT PLACEMENT  10/31/2017   Procedure: BILIARY STENT PLACEMENT;  Surgeon: [name], MD;  Location: [facility] ENDOSCOPY;  Service: Gastroenterology;;  . ERCP N/A 10/31/2017   Procedure: ENDOSCOPIC RETROGRADE CHOLANGIOPANCREATOGRAPHY (ERCP);  Surgeon: [name], MD;  Location: [facility] ENDOSCOPY;  Service: Gastroenterology;  Laterality: N/A;  . ESOPHAGOGASTRODUODENOSCOPY N/A 11/08/2017   Procedure: ESOPHAGOGASTRODUODENOSCOPY (EGD);   Surgeon: Milus Banister, MD;  Location: [facility] ENDOSCOPY;  Service: Endoscopy;  Laterality: N/A;  . EUS N/A 11/08/2017   Procedure: UPPER ENDOSCOPIC ULTRASOUND (EUS) RADIAL;  Surgeon: Milus Banister, MD;  Location: [facility] ENDOSCOPY;  Service: Endoscopy;  Laterality: N/A;  . FINE NEEDLE ASPIRATION  11/08/2017   Procedure: FINE NEEDLE ASPIRATION;  Surgeon: Milus Banister, MD;  Location: [facility] ENDOSCOPY;  Service: Endoscopy;;  . IR IMAGING GUIDED PORT INSERTION  11/15/2017  . SPHINCTEROTOMY  10/31/2017   Procedure: SPHINCTEROTOMY;  Surgeon: [name], MD;  Location: [facility] ENDOSCOPY;  Service: Gastroenterology;;  . TONSILLECTOMY       OB History   None      Home Medications    Prior to Admission medications   Medication Sig Start Date End Date Taking? Authorizing Provider  camphor-menthol [facility]) lotion Apply topically as needed for itching. 10/31/17   Alma Friendly, MD  hydrOXYzine (ATARAX/VISTARIL) 50 MG tablet Take 1 tablet (50 mg total) by mouth 3 (three) times daily as needed. 10/31/17   Alma Friendly, MD  lidocaine-prilocaine (EMLA) cream Apply 1 application topically as needed. 11/09/17   Truitt Merle, MD  loperamide (IMODIUM) 2 MG capsule Take 2 capsules (4 mg total) by mouth every 6 (six) hours as needed for diarrhea or loose stools. 11/21/17   Truitt Merle, MD  loratadine (CLARITIN) 10 MG tablet Take 1 tablet (10 mg total) by mouth daily. Take for 5 days after the Udenyca injection 11/19/17   Truitt Merle, MD  morphine (MSIR)  15 MG tablet Take 0.5 tablets (7.5 mg total) by mouth every 6 (six) hours as needed for severe pain. 11/09/17   Truitt Merle, MD  nicotine (NICODERM CQ - DOSED IN MG/24 HOURS) 14 mg/24hr patch Place 1 patch (14 mg total) onto the skin daily. 11/01/17   Alma Friendly, MD  ondansetron (ZOFRAN) 8 MG tablet Take 1 tablet (8 mg total) by mouth every 8 (eight) hours as needed for nausea or vomiting. 11/09/17   Truitt Merle, MD  oxyCODONE-acetaminophen  (PERCOCET/ROXICET) 5-325 MG tablet Take 2 tablets by mouth every 4 (four) hours as needed for severe pain. 11/08/17   Isla Pence, MD  prochlorperazine (COMPAZINE) 10 MG tablet Take 1 tablet (10 mg total) by mouth every 6 (six) hours as needed for nausea or vomiting. 11/09/17   Truitt Merle, MD  promethazine (PHENERGAN) 25 MG tablet Take 1 tablet (25 mg total) by mouth every 6 (six) hours as needed for nausea or vomiting. 11/08/17   Isla Pence, MD    Family History Family History  Problem Relation Age of Onset  . Diabetes Mother   . Hypertension Mother   . COPD Mother   . Cancer Mother 61       uterine cancer   . Diabetes Sister   . Hypertension Sister   . Cancer Maternal Grandmother        lung cancer  . Hypertension Sister     Social History Social History   Tobacco Use  . Smoking status: Current Every Day Smoker    Packs/day: 1.00    Years: 37.00    Pack years: 37.00  . Smokeless tobacco: Never Used  Substance Use Topics  . 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 Drug use: Never     Allergies   Latex   Review of Systems Review of Systems  Gastrointestinal: Positive for abdominal pain.  All other systems reviewed and are negative except that which was mentioned in HPI    Physical Exam Updated Vital Signs BP 100/72   Pulse 91   Temp 98.2 F (36.8 C) (Oral)   Resp 12   Ht 4' 11.5" (1.511 m)   Wt 55.8 kg   LMP 11/18/2017 (Exact Date) Comment: Pregnacy test waiver   SpO2 100%   BMI 24.43 kg/m   Physical Exam  Constitutional: She is oriented to person, place, and time. She appears well-developed and well-nourished. No distress.  HENT:  Head: Normocephalic and atraumatic.  Moist mucous membranes  Eyes: Pupils are equal, round, and reactive to light. Conjunctivae are normal.  Neck: Neck supple.  Cardiovascular: Normal rate, regular rhythm and normal heart sounds.  No murmur heard. Pulmonary/Chest: Effort  normal and breath sounds normal.  Abdominal: Soft. Bowel sounds are normal. She exhibits no distension. There is tenderness in the right upper quadrant, right lower quadrant, epigastric area and periumbilical area. There is no rebound and no guarding.  Musculoskeletal: She exhibits no edema.  Neurological: She is alert and oriented to person, place, and time.  Fluent speech  Skin: Skin is warm and dry.  Psychiatric: She has a normal mood and affect. Judgment normal.  Nursing note and vitals reviewed.    ED Treatments / Results  Labs (all labs ordered are listed, but only abnormal results are displayed) Labs Reviewed  COMPREHENSIVE METABOLIC PANEL - Abnormal; Notable for the following components:      Result Value   Glucose, Bld 64 (*)  Alkaline Phosphatase 198 (*)    All other components within normal limits  CBC WITH DIFFERENTIAL/PLATELET - Abnormal; Notable for the following components:   WBC 35.6 (*)    RBC 6.06 (*)    MCV 66.5 (*)    MCH 20.3 (*)    Neutro Abs 28.1 (*)    Monocytes Absolute 2.7 (*)    Eosinophils Absolute 0.7 (*)    Basophils Absolute 0.2 (*)    Abs Immature Granulocytes 1.10 (*)    All other components within normal limits  URINALYSIS, ROUTINE W REFLEX MICROSCOPIC - Abnormal; Notable for the following components:   Color, Urine AMBER (*)    APPearance CLOUDY (*)    Ketones, ur 20 (*)    Bacteria, UA RARE (*)    All other components within normal limits  RAPID URINE DRUG SCREEN, HOSP PERFORMED - Abnormal; Notable for the following components:   Opiates POSITIVE (*)    All other components within normal limits  LIPASE, BLOOD    EKG None  Radiology No results found.  Procedures Procedures (including critical care time)  Medications Ordered in ED Medications  ondansetron (ZOFRAN) injection 4 mg (4 mg Intravenous Given 11/26/17 0223)  morphine 4 MG/ML injection 4 mg (4 mg Intravenous Given 11/26/17 0223)  dicyclomine (BENTYL) capsule 10 mg  (10 mg Oral Given 11/26/17 0219)  oxyCODONE-acetaminophen (PERCOCET/ROXICET) 5-325 MG per tablet 2 tablet (2 tablets Oral Given 11/26/17 0530)     Initial Impression / Assessment and Plan / ED Course  I have reviewed the triage vital signs and the nursing notes.  Pertinent labs & imaging results that were available during my care of the patient were reviewed by me and considered in my medical decision making (see chart for details).    Afebrile on exam, no peritonitis. Labs show glucose 64 for which I gave juice, normal LFTs and lipase. WBC 35.6 which is likely due to having pegfilgrastim injection on 11/6 as pt denies infectious symptoms. Gave pain meds, zofran, bentyl.   Given that her pain is similar to the pain she has had previously, only worse tonight without her usual pain medications, I do not feel she needs any imaging especially given her reassuring lab work.  She is comfortable and well-appearing on reassessment.  I have reviewed work-up findings and instructed her to follow-up with her oncologist.  Return precautions reviewed and she voiced understanding.  Final Clinical Impressions(s) / ED Diagnoses   Final diagnoses:  Abdominal pain, unspecified abdominal location    ED Discharge Orders    None       Daxtin Leiker, Wenda Overland, MD 11/26/17 [phone]

## 2017-11-26 NOTE — Telephone Encounter (Signed)
Patient calls to inform Dr. Burr Medico had to go to ED last night with extreme abdominal pain, shooting, stabbing pain.  Her pain medications went missing about 2 days ago from the place she was staying. She has since left that home and is staying temporarily with an Aunt.    Explained I would let Dr. Burr Medico know and call her back.

## 2017-11-26 NOTE — Telephone Encounter (Signed)
I refilled morphine #20 for her, and will schedule her f/u and IVF tomorrow with NP Lacie, and discuss narcotics management.   Truitt Merle MD

## 2017-11-26 NOTE — ED Notes (Signed)
Patient verbalizes understanding of discharge instructions. Opportunity for questioning and answers were provided. Armband removed by staff, pt discharged from ED home via POV.  

## 2017-11-26 NOTE — Telephone Encounter (Signed)
Spoke to pt regarding appts per 11/11 sch message

## 2017-11-26 NOTE — Telephone Encounter (Signed)
Spoke with patient per Dr. Burr Medico let her know Dr. Burr Medico sent in a small quantity of pain medication to [facility].  Instructed her that she will have to come in tomorrow to see Cira Rue NP get labs, and possible IVF, she verbalized an understanding.

## 2017-11-27 ENCOUNTER — Inpatient Hospital Stay: Payer: Medicaid Other

## 2017-11-27 ENCOUNTER — Ambulatory Visit: Payer: Self-pay

## 2017-11-27 ENCOUNTER — Inpatient Hospital Stay (HOSPITAL_BASED_OUTPATIENT_CLINIC_OR_DEPARTMENT_OTHER): Payer: Medicaid Other | Admitting: Nurse Practitioner

## 2017-11-27 ENCOUNTER — Encounter: Payer: Self-pay | Admitting: Nurse Practitioner

## 2017-11-27 VITALS — BP 121/74 | HR 92 | Temp 98.5°F | Resp 18 | Ht 59.5 in | Wt 124.8 lb

## 2017-11-27 DIAGNOSIS — R220 Localized swelling, mass and lump, head: Secondary | ICD-10-CM

## 2017-11-27 DIAGNOSIS — Z7689 Persons encountering health services in other specified circumstances: Secondary | ICD-10-CM | POA: Diagnosis not present

## 2017-11-27 DIAGNOSIS — D509 Iron deficiency anemia, unspecified: Secondary | ICD-10-CM

## 2017-11-27 DIAGNOSIS — K5900 Constipation, unspecified: Secondary | ICD-10-CM

## 2017-11-27 DIAGNOSIS — Z23 Encounter for immunization: Secondary | ICD-10-CM

## 2017-11-27 DIAGNOSIS — E669 Obesity, unspecified: Secondary | ICD-10-CM

## 2017-11-27 DIAGNOSIS — I700 Atherosclerosis of aorta: Secondary | ICD-10-CM

## 2017-11-27 DIAGNOSIS — Z79899 Other long term (current) drug therapy: Secondary | ICD-10-CM

## 2017-11-27 DIAGNOSIS — R110 Nausea: Secondary | ICD-10-CM

## 2017-11-27 DIAGNOSIS — E119 Type 2 diabetes mellitus without complications: Secondary | ICD-10-CM

## 2017-11-27 DIAGNOSIS — G893 Neoplasm related pain (acute) (chronic): Secondary | ICD-10-CM

## 2017-11-27 DIAGNOSIS — C250 Malignant neoplasm of head of pancreas: Secondary | ICD-10-CM | POA: Diagnosis not present

## 2017-11-27 DIAGNOSIS — D563 Thalassemia minor: Secondary | ICD-10-CM

## 2017-11-27 DIAGNOSIS — D72829 Elevated white blood cell count, unspecified: Secondary | ICD-10-CM

## 2017-11-27 DIAGNOSIS — I2510 Atherosclerotic heart disease of native coronary artery without angina pectoris: Secondary | ICD-10-CM

## 2017-11-27 DIAGNOSIS — Z5111 Encounter for antineoplastic chemotherapy: Secondary | ICD-10-CM | POA: Diagnosis not present

## 2017-11-27 DIAGNOSIS — F17210 Nicotine dependence, cigarettes, uncomplicated: Secondary | ICD-10-CM

## 2017-11-27 DIAGNOSIS — I10 Essential (primary) hypertension: Secondary | ICD-10-CM

## 2017-11-27 LAB — CMP (CANCER CENTER ONLY)
ALT: 22 U/L (ref 0–44)
AST: 21 U/L (ref 15–41)
Albumin: 3.7 g/dL (ref 3.5–5.0)
Alkaline Phosphatase: 137 U/L — ABNORMAL HIGH (ref 38–126)
Anion gap: 9 (ref 5–15)
BUN: 10 mg/dL (ref 6–20)
CHLORIDE: 104 mmol/L (ref 98–111)
CO2: 25 mmol/L (ref 22–32)
Calcium: 9 mg/dL (ref 8.9–10.3)
Creatinine: 0.72 mg/dL (ref 0.44–1.00)
Glucose, Bld: 81 mg/dL (ref 70–99)
POTASSIUM: 4 mmol/L (ref 3.5–5.1)
Sodium: 138 mmol/L (ref 135–145)
TOTAL PROTEIN: 7 g/dL (ref 6.5–8.1)
Total Bilirubin: 0.6 mg/dL (ref 0.3–1.2)

## 2017-11-27 LAB — CBC WITH DIFFERENTIAL (CANCER CENTER ONLY)
Abs Immature Granulocytes: 1.3 10*3/uL — ABNORMAL HIGH (ref 0.00–0.07)
BASOS ABS: 0.1 10*3/uL (ref 0.0–0.1)
BASOS PCT: 0 %
EOS ABS: 0.4 10*3/uL (ref 0.0–0.5)
Eosinophils Relative: 1 %
HCT: 34.2 % — ABNORMAL LOW (ref 36.0–46.0)
Hemoglobin: 10.9 g/dL — ABNORMAL LOW (ref 12.0–15.0)
Immature Granulocytes: 4 %
LYMPHS ABS: 3.2 10*3/uL (ref 0.7–4.0)
Lymphocytes Relative: 10 %
MCH: 20.8 pg — AB (ref 26.0–34.0)
MCHC: 31.9 g/dL (ref 30.0–36.0)
MCV: 65.3 fL — AB (ref 80.0–100.0)
Monocytes Absolute: 2.5 10*3/uL — ABNORMAL HIGH (ref 0.1–1.0)
Monocytes Relative: 8 %
Neutro Abs: 23.6 10*3/uL — ABNORMAL HIGH (ref 1.7–7.7)
Neutrophils Relative %: 77 %
PLATELETS: 307 10*3/uL (ref 150–400)
RBC: 5.24 MIL/uL — ABNORMAL HIGH (ref 3.87–5.11)
RDW: 15 % (ref 11.5–15.5)
WBC Count: 31.5 10*3/uL — ABNORMAL HIGH (ref 4.0–10.5)
nRBC: 0.1 % (ref 0.0–0.2)

## 2017-11-27 MED ORDER — INFLUENZA VAC SPLIT QUAD 0.5 ML IM SUSY
0.5000 mL | PREFILLED_SYRINGE | Freq: Once | INTRAMUSCULAR | Status: AC
  Administered 2017-11-27: 0.5 mL via INTRAMUSCULAR

## 2017-11-27 MED ORDER — INFLUENZA VAC SPLIT QUAD 0.5 ML IM SUSY
PREFILLED_SYRINGE | INTRAMUSCULAR | Status: AC
  Filled 2017-11-27: qty 0.5

## 2017-11-27 MED ORDER — HEPARIN SOD (PORK) LOCK FLUSH 100 UNIT/ML IV SOLN
500.0000 [IU] | Freq: Once | INTRAVENOUS | Status: AC | PRN
  Administered 2017-11-27: 500 [IU]
  Filled 2017-11-27: qty 5

## 2017-11-27 MED ORDER — SODIUM CHLORIDE 0.9% FLUSH
10.0000 mL | INTRAVENOUS | Status: DC | PRN
  Administered 2017-11-27: 10 mL
  Filled 2017-11-27: qty 10

## 2017-11-27 NOTE — Progress Notes (Signed)
[facility]  [phone] [phone]  Clinic Follow up Note   Patient Care Team: Patient, No Pcp Per as PCP - General (General Practice) Jacobs, Daniel P, MD as Attending Physician (Gastroenterology) Feng, Yan, MD as Consulting Physician (Hematology) 11/27/2017  SUMMARY OF ONCOLOGIC HISTORY: Oncology History   Cancer Staging Pancreatic cancer (HCC) Staging form: Exocrine Pancreas, AJCC 8th Edition - Clinical stage from 11/08/2017: Stage IB (cT2, cN0, cM0) - Signed by Feng, Yan, MD on 11/09/2017       Pancreatic cancer (HCC)   10/30/2017 Imaging    MRI Abdomen 10/30/17  IMPRESSION: 1. Marked intra and extrahepatic biliary duct dilatation with abrupt cut off of the common bile duct at the pancreatic head. This is associated with marked dilatation of the main pancreatic duct also with an abrupt cut off at the level of the pancreatic head. Pancreatic parenchyma in the body and tail of pancreas is markedly atrophic. A subtle 2.5 x 3 cm lesion with differential signal intensity is identified in the head of the pancreas which appears slightly hypoenhancing on postcontrast imaging. Together, these features are highly concerning for pancreatic adenocarcinoma. EUS is recommended to further evaluate. 2. Portal vein is patent although there is some mass-effect on the portal splenic confluence and IVC. Fat planes around celiac axis and SMA appear preserved. 3. Upper normal to borderline portal caval lymph node associated with small right para-aortic lymphadenopathy.      10/30/2017 Tumor Marker    Baseline Ca 19-9 at 140    10/31/2017 Procedure    ERCP by Dr. Gupta 10/31/17  IMPRESSION Malignant appearing CBD stricture (due to pancreatic mass) status post sphincterotomy and stent insertion.    11/08/2017 Cancer Staging    Staging form: Exocrine Pancreas, AJCC 8th Edition - Clinical stage from 11/08/2017: Stage IB (cT2, cN0, cM0) - Signed  by Feng, Yan, MD on 11/09/2017    11/08/2017 Procedure    EUS and EGD by Dr. Jacobs 11/08/17  IMPRESSION -Very vague, irregularly bordered, approximately 2.6cm mass was found in the head of pancreas. This causes biliary and pancreatic duct obstruction. The previously plastic biliary stent is in good position. The mass directly abuts and attenuates the PV/SMV confluence, suggesting invasion. FNA performed and the preliminary cytology review is positive for maligancy (adenocarcinoma). - [facility] office will arrange referrals to medical and surgical oncology. - Case to be discussed at upcoming multidisciplinary GI tumor conference.    11/08/2017 Initial Biopsy    Biopsy Cytology 11/08/17 Diagnosis FINE NEEDLE ASPIRATION, ENDOSCOPIC, PANCREAS HEAD (SPECIMEN 1 OF 1 COLLECTED 11/08/17): MALIGNANT CELLS CONSISTENT WITH ADENOCARCINOMA.    11/09/2017 Initial Diagnosis    Pancreatic cancer (HCC)    11/15/2017 Imaging    CT Chest and Pelvis WO Contrast 11/15/17 IMPRESSION: 1. No findings of active malignancy in the chest or pelvis. 2. Aortic Atherosclerosis (ICD10-I70.0). Coronary atherosclerosis. 3. Faint centrilobular nodularity in the lung apices, raising the possibility of hypersensitivity pneumonitis or less likely respiratory bronchiolitis. Airway thickening is present, suggesting bronchitis or reactive airways disease.    11/19/2017 -  Chemotherapy    FOLFIRINOX q2weeks with Neulasta injection for 4 months starting 11/19/17   CURRENT THERAPY: neoadjuvant FOLFIRINOX q2weeks with Udenyca injection for 4 months starting 11/19/17  INTERVAL HISTORY: Jane Brooks returns for ED f/u visit. She completed cycle 1 FOLFIRINOX on 11/4 with Udenyca on 11/6. She had cramping during irinotecan that subsided after atropine. Gradually last week her appetite decreased and she became constipated. She notes she is   a "nibbler" at baseline. She is able to drink well. She took "women's laxative" and  miralax on 11/10 then had a large BM. She has had 1 loose BM daily since then. She also ate BBQ that night for dinner. Few hours later she woke up with stabbing upper abdominal pain and went to ED. On 11/8 her pain medication disappeared while living with 3 other roommates. She had no pain medication on 11/9 or 11/10. She vomited twice in the ED. She was given morphine, zofran, and bentyl in ED, pain and nausea subsided and she was discharged home. No imaging was done. She got morphine refill 1 day ago. Takes 0.5 tab q6 hours; pain improves from 8/10 to 5/10 after medication. She gets pain relief for 5-6 hours.   Otherwise, she has no specific complaints. Mild cold sensitivity lasted 1 week. She denies mucositis, fever, chills, cough, chest pain, dyspnea, leg edema, rash, or hand redness.    MEDICAL HISTORY:  Past Medical History:  Diagnosis Date  . Gallstones 10/2017  . Pancreatic cancer ([name])   . Pneumothorax, closed, traumatic    years ago    SURGICAL HISTORY: Past Surgical History:  Procedure Laterality Date  . BILIARY STENT PLACEMENT  10/31/2017   Procedure: BILIARY STENT PLACEMENT;  Surgeon: [name], MD;  Location: [facility] ENDOSCOPY;  Service: Gastroenterology;;  . ERCP N/A 10/31/2017   Procedure: ENDOSCOPIC RETROGRADE CHOLANGIOPANCREATOGRAPHY (ERCP);  Surgeon: [name], MD;  Location: [facility] ENDOSCOPY;  Service: Gastroenterology;  Laterality: N/A;  . ESOPHAGOGASTRODUODENOSCOPY N/A 11/08/2017   Procedure: ESOPHAGOGASTRODUODENOSCOPY (EGD);  Surgeon: Milus Banister, MD;  Location: [facility] ENDOSCOPY;  Service: Endoscopy;  Laterality: N/A;  . EUS N/A 11/08/2017   Procedure: UPPER ENDOSCOPIC ULTRASOUND (EUS) RADIAL;  Surgeon: Milus Banister, MD;  Location: [facility] ENDOSCOPY;  Service: Endoscopy;  Laterality: N/A;  . FINE NEEDLE ASPIRATION  11/08/2017   Procedure: FINE NEEDLE ASPIRATION;  Surgeon: Milus Banister, MD;  Location: [facility] ENDOSCOPY;  Service: Endoscopy;;  . IR IMAGING GUIDED PORT  INSERTION  11/15/2017  . SPHINCTEROTOMY  10/31/2017   Procedure: SPHINCTEROTOMY;  Surgeon: [name], MD;  Location: [facility] ENDOSCOPY;  Service: Gastroenterology;;  . TONSILLECTOMY      I have reviewed the social history and family history with the patient and they are unchanged from previous note.  ALLERGIES:  is allergic to latex.  MEDICATIONS:  Current Outpatient Medications  Medication Sig Dispense Refill  . lidocaine-prilocaine (EMLA) cream Apply 1 application topically as needed. 30 g 2  . loperamide (IMODIUM) 2 MG capsule Take 2 capsules (4 mg total) by mouth every 6 (six) hours as needed for diarrhea or loose stools. 60 capsule 0  . loratadine (CLARITIN) 10 MG tablet Take 1 tablet (10 mg total) by mouth daily. Take for 5 days after the Udenyca injection 30 tablet 2  . morphine (MSIR) 15 MG tablet Take 0.5 tablets (7.5 mg total) by mouth every 6 (six) hours as needed for severe pain. 20 tablet 0  . prochlorperazine (COMPAZINE) 10 MG tablet Take 1 tablet (10 mg total) by mouth every 6 (six) hours as needed for nausea or vomiting. 30 tablet 3  . promethazine (PHENERGAN) 25 MG tablet Take 1 tablet (25 mg total) by mouth every 6 (six) hours as needed for nausea or vomiting. 30 tablet 0   Current Facility-Administered Medications  Medication Dose Route Frequency Provider Last Rate Last Dose  . sodium chloride flush (NS) 0.9 % injection 10 mL  10 mL Intracatheter PRN Truitt Merle,  MD   10 mL at 11/27/17 1454    PHYSICAL EXAMINATION: ECOG PERFORMANCE STATUS: 1 - Symptomatic but completely ambulatory  Vitals:   11/27/17 1338  BP: 121/74  Pulse: 92  Resp: 18  Temp: 98.5 F (36.9 C)  SpO2: 100%   Filed Weights   11/27/17 1338  Weight: 124 lb 12.8 oz (56.6 kg)    GENERAL:alert, no distress and comfortable SKIN: no rashes or significant lesions EYES: sclera clear OROPHARYNX:no thrush or ulcers  LYMPH:  no palpable cervical or supraclavicular lymphadenopathy  LUNGS: clear to  auscultation with normal breathing effort HEART: regular rate & rhythm, no lower extremity edema ABDOMEN:abdomen soft and normal bowel sounds; epigastric tenderness  Musculoskeletal: no cyanosis of digits and no clubbing  NEURO: alert & oriented x 3 with fluent speech, no focal motor/sensory deficits PAC without erythema    LABORATORY DATA:  I have reviewed the data as listed CBC Latest Ref Rng & Units 11/27/2017 11/26/2017 11/19/2017  WBC 4.0 - 10.5 K/uL 31.5(H) 35.6(H) 6.3  Hemoglobin 12.0 - 15.0 g/dL 10.9(L) 12.3 10.9(L)  Hematocrit 36.0 - 46.0 % 34.2(L) 40.3 35.0(L)  Platelets 150 - 400 K/uL 307 255 322     CMP Latest Ref Rng & Units 11/27/2017 11/26/2017 11/19/2017  Glucose 70 - 99 mg/dL 81 64(L) 89  BUN 6 - 20 mg/dL _0 Creatinine 0.44 - 1.00 mg/dL 0.72 0.69 0.68  Sodium 135 - 145 mmol/L 138 138 139  Potassium 3.5 - 5.1 mmol/L 4.0 4.1 4.1  Chloride 98 - 111 mmol/L 104 103 103  CO2 22 - 32 mmol/L _1 Calcium 8.9 - 10.3 mg/dL 9.0 9.5 9.1  Total Protein 6.5 - 8.1 g/dL 7.0 7.7 7.1  Total Bilirubin 0.3 - 1.2 mg/dL 0.6 0.8 0.9  Alkaline Phos 38 - 126 U/L 137(H) 198(H) 88  AST 15 - 41 U/L _2 ALT 0 - 44 U/L _3 RADIOGRAPHIC STUDIES: I have personally reviewed the radiological images as listed and agreed with the findings in the report. No results found.   ASSESSMENT & PLAN: Zaina Jenkin is a 47 y.o. female with a history of H/o Gallstones and H/o Pneumothorax from prior trauma.,  Presented with jaundice and abdominal pain.   1. 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  -She reported to ED on 11/11, pain and nausea were treated and she was discharged home. No imaging was done. She had been without pain medication for 2 days, coupled with increased laxative use likely contributing to pain flare.  -Pain  medication has been refilled, pain is stable and manageable. I encouraged her to track her pain, to see how long medication provides relief so it can be titrated if necessary.  -she has since moved housing, living with her aunt and 2 elderly women; I encouraged her to lock her pain medication in a safe place.  -She has 1 loose BM daily, I encouraged her to use imodium if she has more than 1; she agrees.  -Labs reflect leukocytosis likely secondary to udenyca, no obvious infection. Hgb stable. CMP is unremarkable, Cr and electrolytes normal; alk phos slightly elevated but improving.  -She does not appear dehydrated. Will hold IVF today, she is able to drink adequately. I encouraged her to increase po intake to gain weight.  -F/u in 1 week with cycle 2  2. Abdominal pain, secondary to #1  -Currently alternating Norco 1 tabs q6hours and Percocet 2 tabs q4hours as needed.  -She was prescribed Vicodin last week by Dr. Jacobs, but was not able to fill due to finances. I asked her to return the prescription to me  -her morphine disappeared from her residence and she was without pain medication x2 days, she had pain flare and went to ED on 11/11.  -currently taking MSIR 7.5 mg q6 hours PRN; she gets relief for 5-6 hours. Will titrate as needed.   3. Social and Financial Support  -Medicaid and disability applications pending, she has a case worker -living with her aunt and 2 elderly women currently -she is stopping by financial office today to get gas card  -surgical consult is pending; if not approved will send to [facility], if she gets approved soon plan for her to see Dr. Byerly.   4. 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 The patient knows to call the clinic with any  problems, questions or concerns. No barriers to learning was detected. I spent 20 minutes counseling the patient face to face. The total time spent in the appointment was 25 minutes and more than 50% was on counseling and review of test results      K , NP 11/27/17    

## 2017-11-29 ENCOUNTER — Encounter: Payer: Self-pay | Admitting: General Practice

## 2017-11-29 ENCOUNTER — Ambulatory Visit: Payer: Self-pay

## 2017-11-29 ENCOUNTER — Telehealth: Payer: Self-pay | Admitting: General Practice

## 2017-11-29 NOTE — Progress Notes (Signed)
[facility] CSW Progress Notes

## 2017-11-29 NOTE — Telephone Encounter (Signed)
[facility] CSW Progress Notes  Call from patient, states she is in need of emergency housing.  Has been staying w relatives who say that she needs to find alternate arrangements ASAP.  No current income, lost job prior to diagnosis of cancer.  Applied for disability through [facility], application is still in process and may take several months for decision.  No family or friend financial support.  Completed [employer] for referral to [employer] [employer] consideration - this is a program that offers housing vouchers, however competition for a voucher is stiff and based on committee decision and needs assessment.  CSW will send referral to [employer], [employer].  Sent patient list of shelters in [facility], also recommended she go to [employer] in order to get latest information on shelter bed availability and current process for applying for bed.  Also reviewed available options for financial support, gave information on [place] and [place].  Contact information sent to patient via email.  Patient advised to connect w Financial Advocate in order to determine access to resources of [employer].    Edwyna Shell, LCSW Clinical Social Worker Phone:  [phone]

## 2017-12-03 ENCOUNTER — Other Ambulatory Visit: Payer: Self-pay | Admitting: Hematology

## 2017-12-03 NOTE — Progress Notes (Signed)
[facility]  [phone] [phone]) [phone]  Clinic Follow up Note   Patient Care Team: Patient, No Pcp Per as PCP - General ([place]) Jane Banister, MD as Attending Physician (Gastroenterology) Jane Merle, MD as Consulting Physician (Hematology) 12/04/2017  SUMMARY OF ONCOLOGIC HISTORY: Oncology History   Cancer Staging Pancreatic cancer [facility]) Staging form: Exocrine Pancreas, AJCC 8th Edition - Clinical stage from 11/08/2017: Stage IB (cT2, cN0, cM0) - Signed by Jane Merle, MD on 11/09/2017       Pancreatic cancer ([place])   10/30/2017 Imaging    MRI Abdomen 10/30/17  IMPRESSION: 1. Marked intra and extrahepatic biliary duct dilatation with abrupt cut off of the common bile duct at the pancreatic head. This is associated with marked dilatation of the main pancreatic duct also with an abrupt cut off at the level of the pancreatic head. Pancreatic parenchyma in the body and tail of pancreas is markedly atrophic. A subtle 2.5 x 3 cm lesion with differential signal intensity is identified in the head of the pancreas which appears slightly hypoenhancing on postcontrast imaging. Together, these features are highly concerning for pancreatic adenocarcinoma. EUS is recommended to further evaluate. 2. Portal vein is patent although there is some mass-effect on the portal splenic confluence and IVC. Fat planes around celiac axis and SMA appear preserved. 3. Upper normal to borderline portal caval lymph node associated with small right para-aortic lymphadenopathy.      10/30/2017 Tumor Marker    Baseline Ca 19-9 at 140    10/31/2017 Procedure    ERCP by Dr. Lyndel Brooks 10/31/17  IMPRESSION Malignant appearing CBD stricture (due to pancreatic mass) status post sphincterotomy and stent insertion.    11/08/2017 Cancer Staging    Staging form: Exocrine Pancreas, AJCC 8th Edition - Clinical stage from 11/08/2017: Stage IB (cT2, cN0, cM0) - Signed  by Jane Merle, MD on 11/09/2017    11/08/2017 Procedure    EUS and EGD by Dr. Ardis Brooks 11/08/17  IMPRESSION -Very vague, irregularly bordered, approximately 2.6cm mass was found in the head of pancreas. This causes biliary and pancreatic duct obstruction. The previously plastic biliary stent is in good position. The mass directly abuts and attenuates the PV/SMV confluence, suggesting invasion. FNA performed and the preliminary cytology review is positive for maligancy (adenocarcinoma). - [place] [facility] office will arrange referrals to medical and surgical oncology. - Case to be discussed at upcoming multidisciplinary GI tumor conference.    11/08/2017 Initial Biopsy    Biopsy Cytology 11/08/17 Diagnosis FINE NEEDLE ASPIRATION, ENDOSCOPIC, PANCREAS HEAD (SPECIMEN 1 OF 1 COLLECTED 11/08/17): MALIGNANT CELLS CONSISTENT WITH ADENOCARCINOMA.    11/09/2017 Initial Diagnosis    Pancreatic cancer ([place])    11/15/2017 Imaging    CT Chest and Pelvis WO Contrast 11/15/17 IMPRESSION: 1. No findings of active malignancy in the chest or pelvis. 2. Aortic Atherosclerosis (ICD10-I70.0). Coronary atherosclerosis. 3. Faint centrilobular nodularity in the lung apices, raising the possibility of hypersensitivity pneumonitis or less likely respiratory bronchiolitis. Airway thickening is present, suggesting bronchitis or reactive airways disease.    11/19/2017 -  Chemotherapy    FOLFIRINOX q2weeks with Neulasta injection for 4 months starting 11/19/17   CURRENT THERAPY: neoadjuvantFOLFIRINOX q2weeks withUdenycainjection for 4 months starting 11/19/17  INTERVAL HISTORY: [name] returns for follow up and cycle 2 FOLFIRINOX as scheduled. She presents with her significant other. She completed cycle 1 on 11/4. She was seen last week for symptom management related to abdominal pain exacerbation. Her pain is stable today. Last dose  7.5 mg morphine at 5: 30 am today, pain is currently 5/10. She reports taking  MSIR q6-7.5 hours during the day, but occasionally wakes up at night needing pain med sooner than 6 hour interval. She feels her pain is increased during stressful situations. Pain regimen is currently adequate. She has mild intermittent nausea without vomiting. Compazine helps, she required 1 dose. BMs are normal, denies constipation or diarrhea. She is eating more, gained 1 lb. Denies fatigue. No recent fever, chills, cough, chest pain, dyspnea, or neuropathy. She notes she was recently approved for medicaid.    MEDICAL HISTORY:  Past Medical History:  Diagnosis Date  . Gallstones 10/2017  . Pancreatic cancer ([place])   . Pneumothorax, closed, traumatic    years ago    SURGICAL HISTORY: Past Surgical History:  Procedure Laterality Date  . BILIARY STENT PLACEMENT  10/31/2017   Procedure: BILIARY STENT PLACEMENT;  Surgeon: Jane Denmark, MD;  Location[facility] ENDOSCOPY;  Service: Gastroenterology;;  . ERCP N/A 10/31/2017   Procedure: ENDOSCOPIC RETROGRADE CHOLANGIOPANCREATOGRAPHY (ERCP);  Surgeon: Jane Denmark, MD;  Location: [facility] ENDOSCOPY;  Service: Gastroenterology;  Laterality: N/A;  . ESOPHAGOGASTRODUODENOSCOPY N/A 11/08/2017   Procedure: ESOPHAGOGASTRODUODENOSCOPY (EGD);  Surgeon: Jane Banister, MD;  Location: [facility] ENDOSCOPY;  Service: Endoscopy;  Laterality: N/A;  . EUS N/A 11/08/2017   Procedure: UPPER ENDOSCOPIC ULTRASOUND (EUS) RADIAL;  Surgeon: Jane Banister, MD;  Location: [facility] ENDOSCOPY;  Service: Endoscopy;  Laterality: N/A;  . FINE NEEDLE ASPIRATION  11/08/2017   Procedure: FINE NEEDLE ASPIRATION;  Surgeon: Jane Banister, MD;  Location: [facility] ENDOSCOPY;  Service: Endoscopy;;  . IR IMAGING GUIDED PORT INSERTION  11/15/2017  . SPHINCTEROTOMY  10/31/2017   Procedure: SPHINCTEROTOMY;  Surgeon: Jane Denmark, MD;  Location: [facility] ENDOSCOPY;  Service: Gastroenterology;;  . TONSILLECTOMY      I have reviewed the social history and family history with the patient and they are unchanged  from previous note.  ALLERGIES:  is allergic to latex.  MEDICATIONS:  Current Outpatient Medications  Medication Sig Dispense Refill  . lidocaine-prilocaine (EMLA) cream Apply 1 application topically as needed. 30 g 2  . loperamide (IMODIUM) 2 MG capsule Take 2 capsules (4 mg total) by mouth every 6 (six) hours as needed for diarrhea or loose stools. 60 capsule 0  . loratadine (CLARITIN) 10 MG tablet Take 1 tablet (10 mg total) by mouth daily. Take for 5 days after the Udenyca injection 30 tablet 2  . morphine (MSIR) 15 MG tablet Take 0.5 tablets (7.5 mg total) by mouth every 6 (six) hours as needed for severe pain. 20 tablet 0  . prochlorperazine (COMPAZINE) 10 MG tablet Take 1 tablet (10 mg total) by mouth every 6 (six) hours as needed for nausea or vomiting. 30 tablet 3  . promethazine (PHENERGAN) 25 MG tablet Take 1 tablet (25 mg total) by mouth every 6 (six) hours as needed for nausea or vomiting. 30 tablet 0   No current facility-administered medications for this visit.    Facility-Administered Medications Ordered in Other Visits  Medication Dose Route Frequency Provider Last Rate Last Dose  . fluorouracil (ADRUCIL) 3,750 mg in sodium chloride 0.9 % 75 mL chemo infusion  2,400 mg/m2 (Treatment Plan Recorded) Intravenous 1 day or 1 dose Jane Merle, MD      . heparin lock flush 100 unit/mL  500 Units Intracatheter Once PRN Jane Merle, MD      . irinotecan (CAMPTOSAR) 240 mg in dextrose 5 % 500 mL chemo infusion  150 mg/m2 (Treatment Plan Recorded) Intravenous Once Jane Merle, MD 341 mL/hr at 12/04/17 1530 240 mg at 12/04/17 1530  . leucovorin 624 mg in dextrose 5 % 250 mL infusion  400 mg/m2 (Treatment Plan Recorded) Intravenous Once Jane Merle, MD 187 mL/hr at 12/04/17 1529 624 mg at 12/04/17 1529  . sodium chloride flush (NS) 0.9 % injection 10 mL  10 mL Intracatheter PRN Jane Merle, MD        PHYSICAL EXAMINATION: ECOG PERFORMANCE STATUS: 1 - Symptomatic but completely  ambulatory  Vitals:   12/04/17 1037  BP: 108/75  Pulse: 79  Resp: 17  Temp: 98.3 F (36.8 C)  SpO2: 100%   Filed Weights   12/04/17 1037  Weight: 125 lb 8 oz (56.9 kg)    GENERAL:alert, no distress and comfortable SKIN: no rashes or significant lesions EYES: sclera clear OROPHARYNX:no thrush or ulcers   LYMPH:  no palpable cervical lymphadenopathy  LUNGS: clear to auscultation with normal breathing effort HEART: regular rate & rhythm, no lower extremity edema ABDOMEN: abdomen soft with normal bowel sounds. Tenderness to epigastric region  Musculoskeletal:no cyanosis of digits and no clubbing  NEURO: alert & oriented x 3 with fluent speech, no focal motor/sensory deficits PAC without erythema    LABORATORY DATA:  I have reviewed the data as listed CBC Latest Ref Rng & Units 12/04/2017 11/27/2017 11/26/2017  WBC 4.0 - 10.5 K/uL 10.7(H) 31.5(H) 35.6(H)  Hemoglobin 12.0 - 15.0 g/dL 11.3(L) 10.9(L) 12.3  Hematocrit 36.0 - 46.0 % 35.9(L) 34.2(L) 40.3  Platelets 150 - 400 K/uL 324 307 255     CMP Latest Ref Rng & Units 12/04/2017 11/27/2017 11/26/2017  Glucose 70 - 99 mg/dL 82 81 64(L)  BUN 6 - 20 mg/dL 10 10 7   Creatinine 0.44 - 1.00 mg/dL 0.66 0.72 0.69  Sodium 135 - 145 mmol/L 140 138 138  Potassium 3.5 - 5.1 mmol/L 4.2 4.0 4.1  Chloride 98 - 111 mmol/L 106 104 103  CO2 22 - 32 mmol/L 23 25 24   Calcium 8.9 - 10.3 mg/dL 9.2 9.0 9.5  Total Protein 6.5 - 8.1 g/dL 7.4 7.0 7.7  Total Bilirubin 0.3 - 1.2 mg/dL 0.6 0.6 0.8  Alkaline Phos 38 - 126 U/L 92 137(H) 198(H)  AST 15 - 41 U/L 21 21 26   ALT 0 - 44 U/L 24 22 25       RADIOGRAPHIC STUDIES: I have personally reviewed the radiological images as listed and agreed with the findings in the report. No results found.   ASSESSMENT & PLAN: Jane Rodriguezis a 47 y.o.femalewith a history of H/o Gallstones and H/o Pneumothorax from prior trauma., Presented with jaundice and abdominal pain.   1. 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  -She reported to ED on 11/11, pain and nausea were treated and she was discharged home. No imaging was done. She had been without pain medication for 2 days prior to ED visit, coupled with increased laxative use likely contributing to pain flare.  -Pain medication has been refilled, pain is stable and manageable. I encouraged her to track her pain, to see how long medication provides relief so it can be titrated if necessary.  -she has since moved housing, living with her aunt and 2 elderly women; I encouraged her to lock her pain medication in a Brooks place.  -BMs normal lately, she knows to use imodium if they become  loose/watery  -Labs reviewed, CBC and CMP stable and adequate to proceed with cycle 2 FOLFIRINOX   2. Abdominal pain, secondary to #1  -Currently alternating Norco 1 tabs q6hours and Percocet 2 tabs q4hours as needed.  -She was prescribed Vicodin last week by Dr. Ardis Brooks, but was not able to fill due to finances. I asked her to return the prescription to me  -her morphine disappeared from her residence and she was without pain medication x2 days, she had pain flare and went to ED on 11/11.  -currently taking MSIR 7.5 mg q6 hours PRN; she gets relief for 6-7.5 hours during the day; pain is slightly worse at night. Will titrate as needed.  -Refilled MSIR today -She did not bring pain meds with her today, and has been approx 6 hours since last dose at home. Will give percocet x1 in infusion room today  3. Social and Acupuncturist  -Medicaid and disability applications have been approved  -living with her aunt and 2 elderly women currently -She has Insurance account manager  -She has not seen a Psychologist, sport and exercise yet, will likely refer her to Dr. Barry Dienes given her medicaid has been approved   4. 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 Genetics  -Given her personal history of pancreas cancer, she qualifies for genetics. She states her mother had "cancerous cells in her uterus." She is interested, I referred her today.   PLAN: -Labs reviewed -Proceed with cycle 2 FOLFIRINOX today -F/u in 2 weeks with cycle 3 -Refilled MSIR  -Referral to genetics    No problem-specific Assessment & Plan notes found for this encounter.   Orders Placed This Encounter  Procedures  . Ambulatory referral to Genetics    Referral Priority:   Routine    Referral Type:   Consultation    Referral Reason:   Specialty Services Required    Number of Visits Requested:   1   All questions were answered. The patient knows to call the clinic with any problems, questions or concerns. No barriers to learning was detected.     Alla Feeling, NP 12/04/17

## 2017-12-04 ENCOUNTER — Inpatient Hospital Stay: Payer: Medicaid Other

## 2017-12-04 ENCOUNTER — Inpatient Hospital Stay (HOSPITAL_BASED_OUTPATIENT_CLINIC_OR_DEPARTMENT_OTHER): Payer: Medicaid Other | Admitting: Nurse Practitioner

## 2017-12-04 ENCOUNTER — Encounter: Payer: Self-pay | Admitting: Nurse Practitioner

## 2017-12-04 VITALS — BP 108/75 | HR 79 | Temp 98.3°F | Resp 17 | Ht 59.5 in | Wt 125.5 lb

## 2017-12-04 DIAGNOSIS — I2510 Atherosclerotic heart disease of native coronary artery without angina pectoris: Secondary | ICD-10-CM

## 2017-12-04 DIAGNOSIS — K5900 Constipation, unspecified: Secondary | ICD-10-CM

## 2017-12-04 DIAGNOSIS — I700 Atherosclerosis of aorta: Secondary | ICD-10-CM

## 2017-12-04 DIAGNOSIS — E119 Type 2 diabetes mellitus without complications: Secondary | ICD-10-CM

## 2017-12-04 DIAGNOSIS — F17210 Nicotine dependence, cigarettes, uncomplicated: Secondary | ICD-10-CM

## 2017-12-04 DIAGNOSIS — D509 Iron deficiency anemia, unspecified: Secondary | ICD-10-CM

## 2017-12-04 DIAGNOSIS — C250 Malignant neoplasm of head of pancreas: Secondary | ICD-10-CM

## 2017-12-04 DIAGNOSIS — Z5111 Encounter for antineoplastic chemotherapy: Secondary | ICD-10-CM | POA: Diagnosis not present

## 2017-12-04 DIAGNOSIS — D72829 Elevated white blood cell count, unspecified: Secondary | ICD-10-CM

## 2017-12-04 DIAGNOSIS — D563 Thalassemia minor: Secondary | ICD-10-CM

## 2017-12-04 DIAGNOSIS — I10 Essential (primary) hypertension: Secondary | ICD-10-CM

## 2017-12-04 DIAGNOSIS — G893 Neoplasm related pain (acute) (chronic): Secondary | ICD-10-CM

## 2017-12-04 DIAGNOSIS — E669 Obesity, unspecified: Secondary | ICD-10-CM

## 2017-12-04 DIAGNOSIS — R110 Nausea: Secondary | ICD-10-CM

## 2017-12-04 DIAGNOSIS — Z7689 Persons encountering health services in other specified circumstances: Secondary | ICD-10-CM | POA: Diagnosis not present

## 2017-12-04 DIAGNOSIS — R220 Localized swelling, mass and lump, head: Secondary | ICD-10-CM

## 2017-12-04 DIAGNOSIS — Z23 Encounter for immunization: Secondary | ICD-10-CM

## 2017-12-04 DIAGNOSIS — Z79899 Other long term (current) drug therapy: Secondary | ICD-10-CM

## 2017-12-04 LAB — CBC WITH DIFFERENTIAL (CANCER CENTER ONLY)
Abs Immature Granulocytes: 0.13 10*3/uL — ABNORMAL HIGH (ref 0.00–0.07)
Basophils Absolute: 0.1 10*3/uL (ref 0.0–0.1)
Basophils Relative: 1 %
EOS PCT: 1 %
Eosinophils Absolute: 0.1 10*3/uL (ref 0.0–0.5)
HEMATOCRIT: 35.9 % — AB (ref 36.0–46.0)
Hemoglobin: 11.3 g/dL — ABNORMAL LOW (ref 12.0–15.0)
Immature Granulocytes: 1 %
LYMPHS ABS: 2.4 10*3/uL (ref 0.7–4.0)
LYMPHS PCT: 23 %
MCH: 20.4 pg — AB (ref 26.0–34.0)
MCHC: 31.5 g/dL (ref 30.0–36.0)
MCV: 64.7 fL — AB (ref 80.0–100.0)
Monocytes Absolute: 0.5 10*3/uL (ref 0.1–1.0)
Monocytes Relative: 5 %
Neutro Abs: 7.6 10*3/uL (ref 1.7–7.7)
Neutrophils Relative %: 69 %
PLATELETS: 324 10*3/uL (ref 150–400)
RBC: 5.55 MIL/uL — ABNORMAL HIGH (ref 3.87–5.11)
RDW: 16 % — ABNORMAL HIGH (ref 11.5–15.5)
WBC Count: 10.7 10*3/uL — ABNORMAL HIGH (ref 4.0–10.5)
nRBC: 0 % (ref 0.0–0.2)

## 2017-12-04 LAB — CMP (CANCER CENTER ONLY)
ALK PHOS: 92 U/L (ref 38–126)
ALT: 24 U/L (ref 0–44)
AST: 21 U/L (ref 15–41)
Albumin: 3.9 g/dL (ref 3.5–5.0)
Anion gap: 11 (ref 5–15)
BILIRUBIN TOTAL: 0.6 mg/dL (ref 0.3–1.2)
BUN: 10 mg/dL (ref 6–20)
CALCIUM: 9.2 mg/dL (ref 8.9–10.3)
CHLORIDE: 106 mmol/L (ref 98–111)
CO2: 23 mmol/L (ref 22–32)
CREATININE: 0.66 mg/dL (ref 0.44–1.00)
Glucose, Bld: 82 mg/dL (ref 70–99)
Potassium: 4.2 mmol/L (ref 3.5–5.1)
Sodium: 140 mmol/L (ref 135–145)
TOTAL PROTEIN: 7.4 g/dL (ref 6.5–8.1)

## 2017-12-04 MED ORDER — MORPHINE SULFATE 15 MG PO TABS: 7.5000 mg | ORAL_TABLET | Freq: Four times a day (QID) | ORAL | 0 refills | Status: DC | PRN

## 2017-12-04 MED ORDER — DEXAMETHASONE SODIUM PHOSPHATE 10 MG/ML IJ SOLN
INTRAMUSCULAR | Status: AC
  Filled 2017-12-04: qty 1

## 2017-12-04 MED ORDER — ATROPINE SULFATE 1 MG/ML IJ SOLN
0.5000 mg | Freq: Once | INTRAMUSCULAR | Status: AC | PRN
  Administered 2017-12-04: 0.5 mg via INTRAVENOUS

## 2017-12-04 MED ORDER — IRINOTECAN HCL CHEMO INJECTION 100 MG/5ML
150.0000 mg/m2 | Freq: Once | INTRAVENOUS | Status: AC
  Administered 2017-12-04: 240 mg via INTRAVENOUS
  Filled 2017-12-04: qty 12

## 2017-12-04 MED ORDER — DEXTROSE 5 % IV SOLN
Freq: Once | INTRAVENOUS | Status: AC
  Administered 2017-12-04: 12:00:00 via INTRAVENOUS
  Filled 2017-12-04: qty 250

## 2017-12-04 MED ORDER — OXALIPLATIN CHEMO INJECTION 100 MG/20ML
85.0000 mg/m2 | Freq: Once | INTRAVENOUS | Status: AC
  Administered 2017-12-04: 135 mg via INTRAVENOUS
  Filled 2017-12-04: qty 7

## 2017-12-04 MED ORDER — HEPARIN SOD (PORK) LOCK FLUSH 100 UNIT/ML IV SOLN
500.0000 [IU] | Freq: Once | INTRAVENOUS | Status: DC | PRN
  Filled 2017-12-04: qty 5

## 2017-12-04 MED ORDER — OXYCODONE-ACETAMINOPHEN 5-325 MG PO TABS
ORAL_TABLET | ORAL | Status: AC
  Filled 2017-12-04: qty 1

## 2017-12-04 MED ORDER — SODIUM CHLORIDE 0.9% FLUSH
10.0000 mL | INTRAVENOUS | Status: DC | PRN
  Filled 2017-12-04: qty 10

## 2017-12-04 MED ORDER — OXYCODONE-ACETAMINOPHEN 5-325 MG PO TABS
1.0000 | ORAL_TABLET | Freq: Once | ORAL | Status: AC
  Administered 2017-12-04: 1 via ORAL

## 2017-12-04 MED ORDER — SODIUM CHLORIDE 0.9 % IV SOLN
2400.0000 mg/m2 | INTRAVENOUS | Status: DC
  Administered 2017-12-04: 3750 mg via INTRAVENOUS
  Filled 2017-12-04: qty 75

## 2017-12-04 MED ORDER — DEXTROSE 5 % IV SOLN
Freq: Once | INTRAVENOUS | Status: AC
  Administered 2017-12-04: 13:00:00 via INTRAVENOUS
  Filled 2017-12-04: qty 250

## 2017-12-04 MED ORDER — PALONOSETRON HCL INJECTION 0.25 MG/5ML
0.2500 mg | Freq: Once | INTRAVENOUS | Status: AC
  Administered 2017-12-04: 0.25 mg via INTRAVENOUS

## 2017-12-04 MED ORDER — DEXAMETHASONE SODIUM PHOSPHATE 10 MG/ML IJ SOLN
10.0000 mg | Freq: Once | INTRAMUSCULAR | Status: AC
  Administered 2017-12-04: 10 mg via INTRAVENOUS

## 2017-12-04 MED ORDER — ATROPINE SULFATE 1 MG/ML IJ SOLN
INTRAMUSCULAR | Status: AC
  Filled 2017-12-04: qty 1

## 2017-12-04 MED ORDER — PALONOSETRON HCL INJECTION 0.25 MG/5ML
INTRAVENOUS | Status: AC
  Filled 2017-12-04: qty 5

## 2017-12-04 MED ORDER — LEUCOVORIN CALCIUM INJECTION 350 MG
400.0000 mg/m2 | Freq: Once | INTRAVENOUS | Status: AC
  Administered 2017-12-04: 624 mg via INTRAVENOUS
  Filled 2017-12-04: qty 31.2

## 2017-12-04 MED FILL — MORPHINE SULFATE IR 15 MG T: 15 | 10 days supply | Qty: 20 | Fill #0

## 2017-12-04 NOTE — Patient Instructions (Signed)
[facility] Discharge Instructions for Patients Receiving Chemotherapy  Today you received the following chemotherapy agents: Oxaliplatin, Leucovorin, Irinotecan, 5FU  To help prevent nausea and vomiting after your treatment, we encourage you to take your nausea medication as directed.   If you develop nausea and vomiting that is not controlled by your nausea medication, call the clinic.   BELOW ARE SYMPTOMS THAT SHOULD BE REPORTED IMMEDIATELY:  *FEVER GREATER THAN 100.5 F  *CHILLS WITH OR WITHOUT FEVER  NAUSEA AND VOMITING THAT IS NOT CONTROLLED WITH YOUR NAUSEA MEDICATION  *UNUSUAL SHORTNESS OF BREATH  *UNUSUAL BRUISING OR BLEEDING  TENDERNESS IN MOUTH AND THROAT WITH OR WITHOUT PRESENCE OF ULCERS  *URINARY PROBLEMS  *BOWEL PROBLEMS  UNUSUAL RASH Items with * indicate a potential emergency and should be followed up as soon as possible.  Feel free to call the clinic should you have any questions or concerns. The clinic phone number is [phone].  Please show the [facility] at check-in to the Emergency Department and triage nurse.

## 2017-12-05 ENCOUNTER — Other Ambulatory Visit: Payer: Self-pay | Admitting: Nurse Practitioner

## 2017-12-05 DIAGNOSIS — C250 Malignant neoplasm of head of pancreas: Secondary | ICD-10-CM

## 2017-12-06 ENCOUNTER — Inpatient Hospital Stay: Payer: Medicaid Other

## 2017-12-06 ENCOUNTER — Telehealth: Payer: Self-pay | Admitting: General Practice

## 2017-12-06 DIAGNOSIS — Z5111 Encounter for antineoplastic chemotherapy: Secondary | ICD-10-CM | POA: Diagnosis not present

## 2017-12-06 DIAGNOSIS — C250 Malignant neoplasm of head of pancreas: Secondary | ICD-10-CM

## 2017-12-06 MED ORDER — PEGFILGRASTIM-CBQV 6 MG/0.6ML ~~LOC~~ SOSY
6.0000 mg | PREFILLED_SYRINGE | Freq: Once | SUBCUTANEOUS | Status: AC
  Administered 2017-12-06: 6 mg via SUBCUTANEOUS

## 2017-12-06 MED ORDER — SODIUM CHLORIDE 0.9% FLUSH
10.0000 mL | INTRAVENOUS | Status: DC | PRN
  Administered 2017-12-06: 10 mL
  Filled 2017-12-06: qty 10

## 2017-12-06 MED ORDER — HEPARIN SOD (PORK) LOCK FLUSH 100 UNIT/ML IV SOLN
500.0000 [IU] | Freq: Once | INTRAVENOUS | Status: AC | PRN
  Administered 2017-12-06: 500 [IU]
  Filled 2017-12-06: qty 5

## 2017-12-06 MED ORDER — PEGFILGRASTIM-CBQV 6 MG/0.6ML ~~LOC~~ SOSY
PREFILLED_SYRINGE | SUBCUTANEOUS | Status: AC
  Filled 2017-12-06: qty 0.6

## 2017-12-06 NOTE — Telephone Encounter (Signed)
[facility] CSW Progress Notes  Called patient, reviewed progress of referral to [facility] for help in finding transitional housing or similar to address housing instability.  No current available beds, encouraged patient to continue to maintain housing stability and notify CSW if she becomes unsheltered.  Will also submit [facility] application when patient provides corrected letter of support, patient states she will provide this today.  Edwyna Shell, LCSW Clinical Social Worker Phone:  [phone]

## 2017-12-11 ENCOUNTER — Telehealth: Payer: Self-pay | Admitting: *Deleted

## 2017-12-11 ENCOUNTER — Other Ambulatory Visit: Payer: Self-pay | Admitting: Hematology

## 2017-12-11 DIAGNOSIS — C250 Malignant neoplasm of head of pancreas: Secondary | ICD-10-CM

## 2017-12-11 MED ORDER — MORPHINE SULFATE 15 MG PO TABS: 7.5000 mg | ORAL_TABLET | Freq: Four times a day (QID) | ORAL | 0 refills | Status: DC | PRN

## 2017-12-11 NOTE — Telephone Encounter (Signed)
Pt called requesting refill of Morphine.  Spoke with pt, and was informed that pt takes 2 tabs per day - half tablet of Morphine 15 mg every 6 hours as needed for pain in her abdomen.  Stated she will run out of med on Thursday  12/13/17.   Pt is going out of town, and will be back on Sunday.  Stated she did not want to end up in ER when out of pain meds. Pt uses  [facility]. Pt's    Phone      [phone]

## 2017-12-11 NOTE — Telephone Encounter (Signed)
I have refilled.   Truitt Merle MD

## 2017-12-12 ENCOUNTER — Emergency Department (HOSPITAL_COMMUNITY)
Admission: EM | Admit: 2017-12-12 | Discharge: 2017-12-12 | Disposition: A | Discharge status: Home / Self Care | Payer: Medicaid Other | Attending: Emergency Medicine | Admitting: Emergency Medicine

## 2017-12-12 ENCOUNTER — Emergency Department (HOSPITAL_COMMUNITY): Payer: Medicaid Other

## 2017-12-12 ENCOUNTER — Encounter (HOSPITAL_COMMUNITY): Payer: Self-pay | Admitting: Family Medicine

## 2017-12-12 DIAGNOSIS — Z8507 Personal history of malignant neoplasm of pancreas: Secondary | ICD-10-CM | POA: Insufficient documentation

## 2017-12-12 DIAGNOSIS — R1013 Epigastric pain: Secondary | ICD-10-CM | POA: Insufficient documentation

## 2017-12-12 DIAGNOSIS — R1010 Upper abdominal pain, unspecified: Secondary | ICD-10-CM

## 2017-12-12 DIAGNOSIS — F17210 Nicotine dependence, cigarettes, uncomplicated: Secondary | ICD-10-CM | POA: Insufficient documentation

## 2017-12-12 LAB — COMPREHENSIVE METABOLIC PANEL
ALBUMIN: 3.6 g/dL (ref 3.5–5.0)
ALK PHOS: 111 U/L (ref 38–126)
ALT: 23 U/L (ref 0–44)
AST: 18 U/L (ref 15–41)
Anion gap: 9 (ref 5–15)
BUN: 9 mg/dL (ref 6–20)
CO2: 21 mmol/L — AB (ref 22–32)
Calcium: 7.8 mg/dL — ABNORMAL LOW (ref 8.9–10.3)
Chloride: 111 mmol/L (ref 98–111)
Creatinine, Ser: 0.44 mg/dL (ref 0.44–1.00)
GFR calc Af Amer: >60 mL/min (ref >60)
GFR calc non Af Amer: >60 mL/min (ref >60)
GLUCOSE: 96 mg/dL (ref 70–99)
POTASSIUM: 2.8 mmol/L — AB (ref 3.5–5.1)
Sodium: 141 mmol/L (ref 135–145)
TOTAL PROTEIN: 6.4 g/dL — AB (ref 6.5–8.1)
Total Bilirubin: 0.7 mg/dL (ref 0.3–1.2)

## 2017-12-12 LAB — URINALYSIS, ROUTINE W REFLEX MICROSCOPIC
BILIRUBIN URINE: NEGATIVE
GLUCOSE, UA: NEGATIVE mg/dL
HGB URINE DIPSTICK: NEGATIVE
KETONES UR: 5 mg/dL — AB
Nitrite: NEGATIVE
PROTEIN: 30 mg/dL — AB
Specific Gravity, Urine: 1.023 (ref 1.005–1.030)
pH: 5 (ref 5.0–8.0)

## 2017-12-12 LAB — CBC WITH DIFFERENTIAL/PLATELET
Abs Immature Granulocytes: 0.62 10*3/uL — ABNORMAL HIGH (ref 0.00–0.07)
BASOS ABS: 0.1 10*3/uL (ref 0.0–0.1)
Basophils Relative: 0 %
EOS ABS: 0.4 10*3/uL (ref 0.0–0.5)
EOS PCT: 1 %
HCT: 33.3 % — ABNORMAL LOW (ref 36.0–46.0)
Hemoglobin: 10.3 g/dL — ABNORMAL LOW (ref 12.0–15.0)
Immature Granulocytes: 2 %
Lymphocytes Relative: 7 %
Lymphs Abs: 2 10*3/uL (ref 0.7–4.0)
MCH: 20.6 pg — AB (ref 26.0–34.0)
MCHC: 30.9 g/dL (ref 30.0–36.0)
MCV: 66.5 fL — AB (ref 80.0–100.0)
Monocytes Absolute: 1.8 10*3/uL — ABNORMAL HIGH (ref 0.1–1.0)
Monocytes Relative: 6 %
NRBC: 0 % (ref 0.0–0.2)
Neutro Abs: 24.1 10*3/uL — ABNORMAL HIGH (ref 1.7–7.7)
Neutrophils Relative %: 84 %
Platelets: 284 10*3/uL (ref 150–400)
RBC: 5.01 MIL/uL (ref 3.87–5.11)
RDW: 16 % — AB (ref 11.5–15.5)
WBC: 29 10*3/uL — AB (ref 4.0–10.5)

## 2017-12-12 LAB — LIPASE, BLOOD: Lipase: 28 U/L (ref 11–51)

## 2017-12-12 MED ORDER — POTASSIUM CHLORIDE CRYS ER 20 MEQ PO TBCR
40.0000 meq | EXTENDED_RELEASE_TABLET | Freq: Once | ORAL | Status: AC
  Administered 2017-12-12: 40 meq via ORAL
  Filled 2017-12-12: qty 2

## 2017-12-12 MED ORDER — HYDROMORPHONE HCL 2 MG/ML IJ SOLN
2.0000 mg | Freq: Once | INTRAMUSCULAR | Status: AC
  Administered 2017-12-12: 2 mg via INTRAMUSCULAR
  Filled 2017-12-12: qty 1

## 2017-12-12 MED ORDER — HYDROMORPHONE HCL 4 MG PO TABS: 4.0000 mg | ORAL_TABLET | Freq: Four times a day (QID) | ORAL | 0 refills | Status: DC | PRN

## 2017-12-12 MED ORDER — SODIUM CHLORIDE (PF) 0.9 % IJ SOLN
INTRAMUSCULAR | Status: AC
  Filled 2017-12-12: qty 50

## 2017-12-12 MED ORDER — SODIUM CHLORIDE 0.9 % IV BOLUS
1000.0000 mL | Freq: Once | INTRAVENOUS | Status: AC
  Administered 2017-12-12: 1000 mL via INTRAVENOUS

## 2017-12-12 MED ORDER — IOPAMIDOL (ISOVUE-300) INJECTION 61%
100.0000 mL | Freq: Once | INTRAVENOUS | Status: AC | PRN
  Administered 2017-12-12: 100 mL via INTRAVENOUS

## 2017-12-12 MED ORDER — POTASSIUM CHLORIDE 10 MEQ/100ML IV SOLN
10.0000 meq | Freq: Once | INTRAVENOUS | Status: AC
  Administered 2017-12-12: 10 meq via INTRAVENOUS
  Filled 2017-12-12: qty 100

## 2017-12-12 MED ORDER — HEPARIN SOD (PORK) LOCK FLUSH 100 UNIT/ML IV SOLN
500.0000 [IU] | Freq: Once | INTRAVENOUS | Status: AC
  Administered 2017-12-12: 500 [IU]
  Filled 2017-12-12: qty 5

## 2017-12-12 MED ORDER — HYDROMORPHONE HCL 1 MG/ML IJ SOLN
1.0000 mg | Freq: Once | INTRAMUSCULAR | Status: AC
  Administered 2017-12-12: 1 mg via INTRAVENOUS
  Filled 2017-12-12: qty 1

## 2017-12-12 MED ORDER — ONDANSETRON HCL 4 MG/2ML IJ SOLN
4.0000 mg | Freq: Once | INTRAMUSCULAR | Status: AC
  Administered 2017-12-12: 4 mg via INTRAVENOUS
  Filled 2017-12-12: qty 2

## 2017-12-12 MED ORDER — POTASSIUM CHLORIDE 10 MEQ/100ML IV SOLN
10.0000 meq | Freq: Once | INTRAVENOUS | Status: DC
  Filled 2017-12-12: qty 100

## 2017-12-12 MED ORDER — IOPAMIDOL (ISOVUE-300) INJECTION 61%
INTRAVENOUS | Status: AC
  Filled 2017-12-12: qty 100

## 2017-12-12 MED FILL — MORPHINE SULFATE IR 15 MG T: 15 | 30 days supply | Qty: 60 | Fill #0

## 2017-12-12 MED FILL — HYDROmorphone HCL 4 MG TABS: 4 | 5 days supply | Qty: 20 | Fill #0

## 2017-12-12 NOTE — ED Triage Notes (Signed)
Patient is experiencing upper right abd pain that started tonight. She is receiving chemotherapy at [facility] for pancreatic cancer. Associated symptoms are nausea or loss of appetite.

## 2017-12-12 NOTE — ED Provider Notes (Signed)
[facility] DEPT Provider Note   CSN: [id number] Arrival date & time: 12/12/17  1324     History   Chief Complaint Chief Complaint  Patient presents with  . Abdominal Pain    HPI Jane Brooks is a 47 y.o. female.  Patient complained of upper abdominal pain.  Patient has pancreatic cancer and a stent in her biliary system.  No nausea no vomiting.  She recently had chemotherapy with a Neulasta shot  The history is provided by the patient. No language interpreter was used.  Abdominal Pain   This is a new problem. The current episode started 2 days ago. The problem occurs constantly. The problem has not changed since onset.The pain is associated with an unknown factor. The pain is located in the epigastric region. The quality of the pain is aching. The pain is at a severity of 5/10. The pain is moderate. Pertinent negatives include fever, diarrhea, frequency, hematuria and headaches. Nothing aggravates the symptoms. Nothing relieves the symptoms.    Past Medical History:  Diagnosis Date  . Gallstones 10/2017  . Pancreatic cancer ([place])   . Pneumothorax, closed, traumatic    years ago    Patient Active Problem List   Diagnosis Date Noted  . Pancreatic cancer ([place]) 11/09/2017  . Pancreatic mass   . Cholecystitis 10/30/2017  . Jaundice 10/30/2017    Past Surgical History:  Procedure Laterality Date  . BILIARY STENT PLACEMENT  10/31/2017   Procedure: BILIARY STENT PLACEMENT;  Surgeon: [name], MD;  Location: [facility] ENDOSCOPY;  Service: Gastroenterology;;  . ERCP N/A 10/31/2017   Procedure: ENDOSCOPIC RETROGRADE CHOLANGIOPANCREATOGRAPHY (ERCP);  Surgeon: [name], MD;  Location: [facility] ENDOSCOPY;  Service: Gastroenterology;  Laterality: N/A;  . ESOPHAGOGASTRODUODENOSCOPY N/A 11/08/2017   Procedure: ESOPHAGOGASTRODUODENOSCOPY (EGD);  Surgeon: Milus Banister, MD;  Location: [facility] ENDOSCOPY;  Service: Endoscopy;  Laterality: N/A;  . EUS N/A  11/08/2017   Procedure: UPPER ENDOSCOPIC ULTRASOUND (EUS) RADIAL;  Surgeon: Milus Banister, MD;  Location: [facility] ENDOSCOPY;  Service: Endoscopy;  Laterality: N/A;  . FINE NEEDLE ASPIRATION  11/08/2017   Procedure: FINE NEEDLE ASPIRATION;  Surgeon: Milus Banister, MD;  Location: [facility] ENDOSCOPY;  Service: Endoscopy;;  . IR IMAGING GUIDED PORT INSERTION  11/15/2017  . SPHINCTEROTOMY  10/31/2017   Procedure: SPHINCTEROTOMY;  Surgeon: [name], MD;  Location: [facility] ENDOSCOPY;  Service: Gastroenterology;;  . TONSILLECTOMY       OB History   None      Home Medications    Prior to Admission medications   Medication Sig Start Date End Date Taking? Authorizing Provider  loperamide (IMODIUM) 2 MG capsule Take 2 capsules (4 mg total) by mouth every 6 (six) hours as needed for diarrhea or loose stools. 11/21/17  Yes Truitt Merle, MD  loratadine (CLARITIN) 10 MG tablet Take 1 tablet (10 mg total) by mouth daily. Take for 5 days after the Udenyca injection Patient taking differently: Take 10 mg by mouth See admin instructions. Take 10 mg by mouth daily for 5 days after the Udenyca injection 11/19/17  Yes Truitt Merle, MD  morphine (MSIR) 15 MG tablet Take 0.5 tablets (7.5 mg total) by mouth every 6 (six) hours as needed for severe pain. 12/11/17  Yes Truitt Merle, MD  prochlorperazine (COMPAZINE) 10 MG tablet Take 1 tablet (10 mg total) by mouth every 6 (six) hours as needed for nausea or vomiting. 11/09/17  Yes Truitt Merle, MD  promethazine (PHENERGAN) 25 MG tablet Take 1 tablet (25 mg  total) by mouth every 6 (six) hours as needed for nausea or vomiting. 11/08/17  Yes Isla Pence, MD  HYDROmorphone (DILAUDID) 4 MG tablet Take 1 tablet (4 mg total) by mouth every 6 (six) hours as needed for severe pain. 12/12/17   Milton Ferguson, MD  lidocaine-prilocaine (EMLA) cream Apply 1 application topically as needed. Patient not taking: Reported on 12/12/2017 11/09/17   Truitt Merle, MD    Family History Family History    Problem Relation Age of Onset  . Diabetes Mother   . Hypertension Mother   . COPD Mother   . Cancer Mother 23       uterine cancer   . Diabetes Sister   . Hypertension Sister   . Cancer Maternal Grandmother        lung cancer  . Hypertension Sister     Social History Social History   Tobacco Use  . Smoking status: Current Every Day Smoker    Packs/day: 1.00    Years: 37.00    Pack years: 37.00    Types: Cigarettes  . Smokeless tobacco: Never Used  Substance Use Topics  . Alcohol use: Not Currently  . Drug use: Never     Allergies   Latex   Review of Systems Review of Systems  Constitutional: Negative for appetite change, fatigue and fever.  HENT: Negative for congestion, ear discharge and sinus pressure.   Eyes: Negative for discharge.  Respiratory: Negative for cough.   Cardiovascular: Negative for chest pain.  Gastrointestinal: Positive for abdominal pain. Negative for diarrhea.  Genitourinary: Negative for frequency and hematuria.  Musculoskeletal: Negative for back pain.  Skin: Negative for rash.  Neurological: Negative for seizures and headaches.  Psychiatric/Behavioral: Negative for hallucinations.     Physical Exam Updated Vital Signs BP 114/76 (BP Location: Left Arm)   Pulse 76   Temp 97.6 F (36.4 C) (Oral)   Resp 18   Ht 4' 11.5" (1.511 m)   Wt 56.9 kg   LMP 11/18/2017 (Exact Date) Comment: Pregnacy test waiver   SpO2 99%   BMI 24.92 kg/m   Physical Exam  Constitutional: She is oriented to person, place, and time. She appears well-developed.  HENT:  Head: Normocephalic.  Eyes: Conjunctivae and EOM are normal. No scleral icterus.  Neck: Neck supple. No thyromegaly present.  Cardiovascular: Normal rate and regular rhythm. Exam reveals no gallop and no friction rub.  No murmur heard. Pulmonary/Chest: No stridor. She has no wheezes. She has no rales. She exhibits no tenderness.  Abdominal: She exhibits no distension. There is tenderness.  There is no rebound.  Musculoskeletal: Normal range of motion. She exhibits no edema.  Lymphadenopathy:    She has no cervical adenopathy.  Neurological: She is oriented to person, place, and time. She exhibits normal muscle tone. Coordination normal.  Skin: No rash noted. No erythema.  Psychiatric: She has a normal mood and affect. Her behavior is normal.     ED Treatments / Results  Labs (all labs ordered are listed, but only abnormal results are displayed) Labs Reviewed  CBC WITH DIFFERENTIAL/PLATELET - Abnormal; Notable for the following components:      Result Value   WBC 29.0 (*)    Hemoglobin 10.3 (*)    HCT 33.3 (*)    MCV 66.5 (*)    MCH 20.6 (*)    RDW 16.0 (*)    Neutro Abs 24.1 (*)    Monocytes Absolute 1.8 (*)    Abs Immature Granulocytes 0.62 (*)  All other components within normal limits  COMPREHENSIVE METABOLIC PANEL - Abnormal; Notable for the following components:   Potassium 2.8 (*)    CO2 21 (*)    Calcium 7.8 (*)    Total Protein 6.4 (*)    All other components within normal limits  URINALYSIS, ROUTINE W REFLEX MICROSCOPIC - Abnormal; Notable for the following components:   Color, Urine AMBER (*)    APPearance CLOUDY (*)    Ketones, ur 5 (*)    Protein, ur 30 (*)    Leukocytes, UA TRACE (*)    Bacteria, UA RARE (*)    All other components within normal limits  LIPASE, BLOOD    EKG None  Radiology Ct Abdomen Pelvis W Contrast  Result Date: 12/12/2017 CLINICAL DATA:  Right upper quadrant pain for 1 day, chemotherapy for pancreatic cancer. Nausea and loss of appetite. EXAM: CT ABDOMEN AND PELVIS WITH CONTRAST TECHNIQUE: Multidetector CT imaging of the abdomen and pelvis was performed using the standard protocol following bolus administration of intravenous contrast. CONTRAST:  127m ISOVUE-300 IOPAMIDOL (ISOVUE-300) INJECTION 61% COMPARISON:  MR abdomen 10/30/2017. FINDINGS: Lower chest: Lung bases are clear. Heart size normal. No pericardial or  pleural effusion. Prepericardiac/juxtadiaphragmatic lymph nodes are subcentimeter in short axis size. Distal esophagus is grossly unremarkable. Hepatobiliary: Biliary ductal dilatation is seen with a stent in the common bile duct. Small amount of pneumobilia in association. Liver is otherwise unremarkable. Gallbladder wall edema/pericholecystic fluid. Pancreas: Slightly low-attenuation pancreatic head mass measures 2.4 x 3.1 cm, as on 10/30/2017. Associated atrophy of the body and tail of the pancreas with ductal dilatation measuring 9 mm. Spleen: Negative. Adrenals/Urinary Tract: Adrenal glands and kidneys are unremarkable. Ureters are decompressed. Bladder is unremarkable. Stomach/Bowel: Stomach is unremarkable. Common bile duct stent terminates in the duodenum. Stomach, small bowel, appendix and colon are unremarkable. Vascular/Lymphatic: There does appear to be encasement and narrowing of the gastroduodenal artery (series 2, image 43). Celiac trunk and superior mesenteric artery have well-defined associated fat planes. Splenic vein is patent. Pancreatic mass abuts the superior mesenteric vein (series 11, image 269). Atherosclerotic calcification of the aorta without aneurysm. Retroperitoneal lymph nodes measure up to 10 mm in the aortocaval station. Periportal/peripancreatic lymph nodes measure up to 10 mm (image 37, series 4). Reproductive: Uterus is visualized.  No adnexal mass. Other: No free fluid. Mesenteries and peritoneum are otherwise unremarkable. Musculoskeletal: No worrisome lytic or sclerotic lesions. IMPRESSION: 1. 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 Mass encases and appears to narrow the gastroduodenal artery, as well as contact the superior mesenteric vein. Associated borderline enlarged peripancreatic and aortocaval lymph nodes. 2. Gallbladder wall edema/pericholecystic fluid. 3.  Aortic atherosclerosis  (ICD10-170.0). Electronically Signed   By: MLorin PicketM.D.   On: 12/12/2017 10:17    Procedures Procedures (including critical care time)  Medications Ordered in ED Medications  potassium chloride 10 mEq in 100 mL IVPB (10 mEq Intravenous Refused 12/12/17 1056)  iopamidol (ISOVUE-300) 61 % injection (has no administration in time range)  sodium chloride (PF) 0.9 % injection (has no administration in time range)  sodium chloride 0.9 % bolus 1,000 mL (0 mLs Intravenous Stopped 12/12/17 0928)  HYDROmorphone (DILAUDID) injection 1 mg (1 mg Intravenous Given 12/12/17 0809)  ondansetron (ZOFRAN) injection 4 mg (4 mg Intravenous Given 12/12/17 0808)  potassium chloride 10 mEq in 100 mL IVPB (0 mEq Intravenous Stopped 12/12/17 1047)  iopamidol (ISOVUE-300) 61 % injection 100 mL (100 mLs Intravenous  Contrast Given 12/12/17 0934)  potassium chloride SA (K-DUR,KLOR-CON) CR tablet 40 mEq (40 mEq Oral Given 12/12/17 1146)  heparin lock flush 100 unit/mL (500 Units Intracatheter Given 12/12/17 1151)  HYDROmorphone (DILAUDID) injection 2 mg (2 mg Intramuscular Given 12/12/17 1231)     Initial Impression / Assessment and Plan / ED Course  I have reviewed the triage vital signs and the nursing notes.  Pertinent labs & imaging results that were available during my care of the patient were reviewed by me and considered in my medical decision making (see chart for details). Patient with abdominal pain.  CT scan shows her pancreatic cancer.  I spoke with the radiologist and there has been no changes since her MRI of her abdomen recently.  Patient improved with IM Dilaudid.  Patient has a significant elevated white count from the Neulasta.  She wishes to go home.  She will be discharged on some Dilaudid and she has an appointment Tuesday for her chemotherapy.  She will return if any problems      Final Clinical Impressions(s) / ED Diagnoses   Final diagnoses:  Pain of upper abdomen    ED  Discharge Orders         Ordered    HYDROmorphone (DILAUDID) 4 MG tablet  Every 6 hours PRN     12/12/17 1257           Milton Ferguson, MD 12/12/17 1300

## 2017-12-12 NOTE — ED Notes (Signed)
Pt's friend is coming to pick her up.  She verbalizes understanding re the risks of driving after taking a narcotic.

## 2017-12-12 NOTE — Discharge Instructions (Addendum)
Follow-up with your oncologist as planned next week get seen sooner if any problems

## 2017-12-12 NOTE — ED Notes (Signed)
Patient refused second Potassium, would like orals instead.

## 2017-12-12 NOTE — ED Notes (Signed)
After leaving the patient changed her mind about leaving AMA and came back for care. MD with the patient now.

## 2017-12-12 NOTE — ED Notes (Signed)
Patient left AMA. Patient refused to sign out AMA prior to leaving.

## 2017-12-12 NOTE — ED Notes (Signed)
Patient transported to CT 

## 2017-12-16 NOTE — Progress Notes (Signed)
[place]  [phone] Fax:([phone]) [phone]  Clinic Follow up Note   Patient Care Team: Patient, No Pcp Per as PCP - General ([place]) Milus Banister, MD as Attending Physician (Gastroenterology) Truitt Merle, MD as Consulting Physician (Hematology) 12/18/2017  SUMMARY OF ONCOLOGIC HISTORY: Oncology History   Cancer Staging Pancreatic cancer [facility]) Staging form: Exocrine Pancreas, AJCC 8th Edition - Clinical stage from 11/08/2017: Stage IB (cT2, cN0, cM0) - Signed by Truitt Merle, MD on 11/09/2017       Pancreatic cancer ([place])   10/30/2017 Imaging    MRI Abdomen 10/30/17  IMPRESSION: 1. Marked intra and extrahepatic biliary duct dilatation with abrupt cut off of the common bile duct at the pancreatic head. This is associated with marked dilatation of the main pancreatic duct also with an abrupt cut off at the level of the pancreatic head. Pancreatic parenchyma in the body and tail of pancreas is markedly atrophic. A subtle 2.5 x 3 cm lesion with differential signal intensity is identified in the head of the pancreas which appears slightly hypoenhancing on postcontrast imaging. Together, these features are highly concerning for pancreatic adenocarcinoma. EUS is recommended to further evaluate. 2. Portal vein is patent although there is some mass-effect on the portal splenic confluence and IVC. Fat planes around celiac axis and SMA appear preserved. 3. Upper normal to borderline portal caval lymph node associated with small right para-aortic lymphadenopathy.      10/30/2017 Tumor Marker    Baseline Ca 19-9 at 140    10/31/2017 Procedure    ERCP by Dr. Lyndel Safe 10/31/17  IMPRESSION Malignant appearing CBD stricture (due to pancreatic mass) status post sphincterotomy and stent insertion.    11/08/2017 Cancer Staging    Staging form: Exocrine Pancreas, AJCC 8th Edition - Clinical stage from 11/08/2017: Stage IB (cT2, cN0, cM0) - Signed by  Truitt Merle, MD on 11/09/2017    11/08/2017 Procedure    EUS and EGD by Dr. Ardis Hughs 11/08/17  IMPRESSION -Very vague, irregularly bordered, approximately 2.6cm mass was found in the head of pancreas. This causes biliary and pancreatic duct obstruction. The previously plastic biliary stent is in good position. The mass directly abuts and attenuates the PV/SMV confluence, suggesting invasion. FNA performed and the preliminary cytology review is positive for maligancy (adenocarcinoma). - [place] [facility] office will arrange referrals to medical and surgical oncology. - Case to be discussed at upcoming multidisciplinary GI tumor conference.    11/08/2017 Initial Biopsy    Biopsy Cytology 11/08/17 Diagnosis FINE NEEDLE ASPIRATION, ENDOSCOPIC, PANCREAS HEAD (SPECIMEN 1 OF 1 COLLECTED 11/08/17): MALIGNANT CELLS CONSISTENT WITH ADENOCARCINOMA.    11/09/2017 Initial Diagnosis    Pancreatic cancer ([place])    11/15/2017 Imaging    CT Chest and Pelvis WO Contrast 11/15/17 IMPRESSION: 1. No findings of active malignancy in the chest or pelvis. 2. Aortic Atherosclerosis (ICD10-I70.0). Coronary atherosclerosis. 3. Faint centrilobular nodularity in the lung apices, raising the possibility of hypersensitivity pneumonitis or less likely respiratory bronchiolitis. Airway thickening is present, suggesting bronchitis or reactive airways disease.    11/19/2017 -  Chemotherapy    FOLFIRINOX q2weeks with Neulasta injection for 4 months starting 11/19/17   CURRENT THERAPY: neoadjuvantFOLFIRINOX q2weeks withUdenycainjection for 4 months starting 11/19/17  INTERVAL HISTORY: [name] returns for follow up and cycle 3 FOLFIRINOX as scheduled. She saw Dr. Barry Dienes since last visit. She plans to see her back in 2 months after restaging scans. Patient went to ED for pain and associated vomiting and diarrhea on 11/27  and was given dilaudid. She was hypokalemic and given IV potassium. She was discharged home. She takes  pain medication q6-7 hours lately, dilaudid for severe pain and MSIR for moderate pain. Dr. Barry Dienes suggested some pain may be related to her gallbladder and suggested less greasy/fatty foods. Her pain is improved over the last few days. She is eating better this week. Denies n/v/c/d. No fever, chills, cough, chest pain, dyspnea, or leg edema. Denies neuropathy other than cold sensitivity that lasts few days after chemo. She is tearful today over her housing situation, her aunt is asking her to move out and she has no definite housing plan to move into.    MEDICAL HISTORY:  Past Medical History:  Diagnosis Date  . Gallstones 10/2017  . Pancreatic cancer ([place])   . Pneumothorax, closed, traumatic    years ago    SURGICAL HISTORY: Past Surgical History:  Procedure Laterality Date  . BILIARY STENT PLACEMENT  10/31/2017   Procedure: BILIARY STENT PLACEMENT;  Surgeon: [name], MD;  Location[facility] ENDOSCOPY;  Service: Gastroenterology;;  . ERCP N/A 10/31/2017   Procedure: ENDOSCOPIC RETROGRADE CHOLANGIOPANCREATOGRAPHY (ERCP);  Surgeon: [name], MD;  Location: [facility] ENDOSCOPY;  Service: Gastroenterology;  Laterality: N/A;  . ESOPHAGOGASTRODUODENOSCOPY N/A 11/08/2017   Procedure: ESOPHAGOGASTRODUODENOSCOPY (EGD);  Surgeon: Milus Banister, MD;  Location: [facility] ENDOSCOPY;  Service: Endoscopy;  Laterality: N/A;  . EUS N/A 11/08/2017   Procedure: UPPER ENDOSCOPIC ULTRASOUND (EUS) RADIAL;  Surgeon: Milus Banister, MD;  Location: [facility] ENDOSCOPY;  Service: Endoscopy;  Laterality: N/A;  . FINE NEEDLE ASPIRATION  11/08/2017   Procedure: FINE NEEDLE ASPIRATION;  Surgeon: Milus Banister, MD;  Location: [facility] ENDOSCOPY;  Service: Endoscopy;;  . IR IMAGING GUIDED PORT INSERTION  11/15/2017  . SPHINCTEROTOMY  10/31/2017   Procedure: SPHINCTEROTOMY;  Surgeon: [name], MD;  Location: [facility] ENDOSCOPY;  Service: Gastroenterology;;  . TONSILLECTOMY      I have reviewed the social history and family history  with the patient and they are unchanged from previous note.  ALLERGIES:  is allergic to latex.  MEDICATIONS:  Current Outpatient Medications  Medication Sig Dispense Refill  . lidocaine-prilocaine (EMLA) cream Apply 1 application topically as needed. 30 g 2  . loperamide (IMODIUM) 2 MG capsule Take 2 capsules (4 mg total) by mouth every 6 (six) hours as needed for diarrhea or loose stools. 60 capsule 0  . loratadine (CLARITIN) 10 MG tablet Take 1 tablet (10 mg total) by mouth daily. Take for 5 days after the Udenyca injection (Patient taking differently: Take 10 mg by mouth See admin instructions. Take 10 mg by mouth daily for 5 days after the Udenyca injection) 30 tablet 2  . morphine (MSIR) 15 MG tablet Take 0.5 tablets (7.5 mg total) by mouth every 6 (six) hours as needed for severe pain. 60 tablet 0  . prochlorperazine (COMPAZINE) 10 MG tablet Take 1 tablet (10 mg total) by mouth every 6 (six) hours as needed for nausea or vomiting. 30 tablet 3  . promethazine (PHENERGAN) 25 MG tablet Take 1 tablet (25 mg total) by mouth every 6 (six) hours as needed for nausea or vomiting. 30 tablet 0  . morphine (MS CONTIN) 15 MG 12 hr tablet Take 1 tablet (15 mg total) by mouth every 12 (twelve) hours. 30 tablet 0   No current facility-administered medications for this visit.     PHYSICAL EXAMINATION: ECOG PERFORMANCE STATUS: 1 - Symptomatic but completely ambulatory  Vitals:   12/18/17 1026  BP:  116/82  Pulse: 87  Resp: 18  Temp: 98.8 F (37.1 C)  SpO2: 99%   Filed Weights   12/18/17 1026  Weight: 124 lb 12.8 oz (56.6 kg)    GENERAL:alert, no distress and comfortable SKIN: no rashes or significant lesions EYES: sclera clear OROPHARYNX:no thrush or ulcers  LYMPH:  no palpable cervical or supraclavicular lymphadenopathy  LUNGS: clear to auscultation with normal breathing effort HEART: regular rate & rhythm, no lower extremity edema ABDOMEN:abdomen soft, normal bowel sounds. Epigastric  tenderness  NEURO: alert & oriented x 3 with fluent speech, no focal motor/sensory deficits PAC without erythema    LABORATORY DATA:  I have reviewed the data as listed CBC Latest Ref Rng & Units 12/18/2017 12/12/2017 12/04/2017  WBC 4.0 - 10.5 K/uL 10.5 29.0(H) 10.7(H)  Hemoglobin 12.0 - 15.0 g/dL 10.8(L) 10.3(L) 11.3(L)  Hematocrit 36.0 - 46.0 % 34.0(L) 33.3(L) 35.9(L)  Platelets 150 - 400 K/uL 269 284 324     CMP Latest Ref Rng & Units 12/18/2017 12/12/2017 12/04/2017  Glucose 70 - 99 mg/dL 103(H) 96 82  BUN 6 - 20 mg/dL 7 9 10   Creatinine 0.44 - 1.00 mg/dL 0.68 0.44 0.66  Sodium 135 - 145 mmol/L 140 141 140  Potassium 3.5 - 5.1 mmol/L 3.6 2.8(L) 4.2  Chloride 98 - 111 mmol/L 104 111 106  CO2 22 - 32 mmol/L 28 21(L) 23  Calcium 8.9 - 10.3 mg/dL 9.0 7.8(L) 9.2  Total Protein 6.5 - 8.1 g/dL 7.0 6.4(L) 7.4  Total Bilirubin 0.3 - 1.2 mg/dL 0.4 0.7 0.6  Alkaline Phos 38 - 126 U/L 109 111 92  AST 15 - 41 U/L 20 18 21   ALT 0 - 44 U/L 27 23 24       RADIOGRAPHIC STUDIES: I have personally reviewed the radiological images as listed and agreed with the findings in the report. No results found.   ASSESSMENT & PLAN: Jane Rodriguezis a 47 y.o.femalewith a history of H/o Gallstones and H/o Pneumothorax from prior trauma., Presented with jaundice and abdominal pain.   1. 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  -she completed cycle 2 on 11/19. She was seen again in ED on 11/27 for pain exacerbation with diarrhea and emesis. Imaging shows stable disease. She was also found to be hypokalemic and given IV KCL. She was discharged home with dilaudid.  -Her pain is improved slightly, but still fluctuates. She saw Dr. Barry Dienes who explained some pain may be contributed by cholecystitis and to avoid greasy/fatty foods, which she has done.  -Dr.  Barry Dienes considers her pancreatic cancer borderline resectable and plans to see her back in 2 months after restaging scans. At that point will determine whether she is a candidate for radiation vs proceeding with surgery. -We discussed pain management. I recommend long acting MS contin to achieve more consistent pain control. I discouraged her from taking MSIR and dilaudid, as both are short acting pain medications and I reviewed their addictive nature. I recommend MS contin 15 mg q12 with MSIR 7.5 mg q6 PRN for breakthrough. I d/c'd dilaudid. She agrees with the plan. -Labs adequate to proceed with cycle 3, hypokalemia resolved.  -Proceed with cycle 3 FOLFIRINOX today, Udenyca on day 3 -F/u in 2 weeks with cycle 4  2. Abdominal pain, secondary to #1  -her morphine disappeared from her residence and she was without pain medication x2 days, she had pain  flare and went to ED on 11/11. -she was seen again in ED 11/27 for pain and given dilaudid -some component of pain coming from gallbladder, she has changed her diet. Pain improved some lately -I recommend long acting pain control, she agrees. Prescribed MS contin 15 mg q12, will continue MSIR 7.5 mg q6 PRN. D/c dilaudid.   3. Social and Financial Support -Medicaid and disability applications have been approved  -living with her aunt and 2 elderly women currently -She has [place] co-pay assistance grant  -Medicaid was approved and I referred her to Dr. Barry Dienes  -She has unstable housing, her aunt has asked her to leave by next week and she has no place to go. Shelters are full. I will reach out to SW today to discuss further.   4. 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 Genetics  -Given her personal history of pancreas cancer, she qualifies for genetics. She states her mother had "cancerous cells in her uterus." She is interested, I referred her today.  -Genetics appt next week  PLAN: -labs  reviewed, proceed with cycle 3 neoadjuvant FOLFIRINOX today -Rx: MS Contin 15 mg q12, continue MSIR 7.5 q6 PRN for breakthrough.  -d/c dilaudid -reach out to SW regarding housing situation  All questions were answered. The patient knows to call the clinic with any problems, questions or concerns. No barriers to learning was detected. I spent 20 minutes counseling the patient face to face. The total time spent in the appointment was 25 minutes and more than 50% was on counseling and review of test results     Alla Feeling, NP 12/18/17

## 2017-12-18 ENCOUNTER — Other Ambulatory Visit: Payer: Self-pay | Admitting: Hematology

## 2017-12-18 ENCOUNTER — Inpatient Hospital Stay: Payer: Medicaid Other

## 2017-12-18 ENCOUNTER — Encounter: Payer: Self-pay | Admitting: General Practice

## 2017-12-18 ENCOUNTER — Inpatient Hospital Stay: Payer: Medicaid Other | Attending: Nurse Practitioner

## 2017-12-18 ENCOUNTER — Encounter: Payer: Self-pay | Admitting: Nurse Practitioner

## 2017-12-18 ENCOUNTER — Inpatient Hospital Stay (HOSPITAL_BASED_OUTPATIENT_CLINIC_OR_DEPARTMENT_OTHER): Payer: Medicaid Other | Admitting: Nurse Practitioner

## 2017-12-18 VITALS — BP 116/82 | HR 87 | Temp 98.8°F | Resp 18 | Ht 59.5 in | Wt 124.8 lb

## 2017-12-18 DIAGNOSIS — G893 Neoplasm related pain (acute) (chronic): Secondary | ICD-10-CM | POA: Insufficient documentation

## 2017-12-18 DIAGNOSIS — I700 Atherosclerosis of aorta: Secondary | ICD-10-CM | POA: Diagnosis not present

## 2017-12-18 DIAGNOSIS — D509 Iron deficiency anemia, unspecified: Secondary | ICD-10-CM

## 2017-12-18 DIAGNOSIS — E876 Hypokalemia: Secondary | ICD-10-CM | POA: Diagnosis not present

## 2017-12-18 DIAGNOSIS — R110 Nausea: Secondary | ICD-10-CM | POA: Insufficient documentation

## 2017-12-18 DIAGNOSIS — Z809 Family history of malignant neoplasm, unspecified: Secondary | ICD-10-CM | POA: Insufficient documentation

## 2017-12-18 DIAGNOSIS — R918 Other nonspecific abnormal finding of lung field: Secondary | ICD-10-CM | POA: Insufficient documentation

## 2017-12-18 DIAGNOSIS — C250 Malignant neoplasm of head of pancreas: Secondary | ICD-10-CM | POA: Insufficient documentation

## 2017-12-18 DIAGNOSIS — Z79899 Other long term (current) drug therapy: Secondary | ICD-10-CM

## 2017-12-18 DIAGNOSIS — D563 Thalassemia minor: Secondary | ICD-10-CM | POA: Diagnosis not present

## 2017-12-18 DIAGNOSIS — Z7689 Persons encountering health services in other specified circumstances: Secondary | ICD-10-CM | POA: Insufficient documentation

## 2017-12-18 DIAGNOSIS — Z5111 Encounter for antineoplastic chemotherapy: Secondary | ICD-10-CM | POA: Diagnosis not present

## 2017-12-18 DIAGNOSIS — R634 Abnormal weight loss: Secondary | ICD-10-CM | POA: Diagnosis not present

## 2017-12-18 DIAGNOSIS — R630 Anorexia: Secondary | ICD-10-CM | POA: Diagnosis not present

## 2017-12-18 LAB — CBC WITH DIFFERENTIAL (CANCER CENTER ONLY)
ABS IMMATURE GRANULOCYTES: 0.1 10*3/uL — AB (ref 0.00–0.07)
Basophils Absolute: 0 10*3/uL (ref 0.0–0.1)
Basophils Relative: 0 %
Eosinophils Absolute: 0.4 10*3/uL (ref 0.0–0.5)
Eosinophils Relative: 4 %
HEMATOCRIT: 34 % — AB (ref 36.0–46.0)
Hemoglobin: 10.8 g/dL — ABNORMAL LOW (ref 12.0–15.0)
IMMATURE GRANULOCYTES: 1 %
LYMPHS ABS: 2.3 10*3/uL (ref 0.7–4.0)
Lymphocytes Relative: 22 %
MCH: 20.5 pg — ABNORMAL LOW (ref 26.0–34.0)
MCHC: 31.8 g/dL (ref 30.0–36.0)
MCV: 64.6 fL — AB (ref 80.0–100.0)
MONOS PCT: 5 %
Monocytes Absolute: 0.5 10*3/uL (ref 0.1–1.0)
NEUTROS ABS: 7.2 10*3/uL (ref 1.7–7.7)
NEUTROS PCT: 68 %
Platelet Count: 269 10*3/uL (ref 150–400)
RBC: 5.26 MIL/uL — ABNORMAL HIGH (ref 3.87–5.11)
RDW: 16.1 % — ABNORMAL HIGH (ref 11.5–15.5)
WBC Count: 10.5 10*3/uL (ref 4.0–10.5)
nRBC: 0 % (ref 0.0–0.2)

## 2017-12-18 LAB — CMP (CANCER CENTER ONLY)
ALK PHOS: 109 U/L (ref 38–126)
ALT: 27 U/L (ref 0–44)
ANION GAP: 8 (ref 5–15)
AST: 20 U/L (ref 15–41)
Albumin: 3.7 g/dL (ref 3.5–5.0)
BUN: 7 mg/dL (ref 6–20)
CALCIUM: 9 mg/dL (ref 8.9–10.3)
CO2: 28 mmol/L (ref 22–32)
Chloride: 104 mmol/L (ref 98–111)
Creatinine: 0.68 mg/dL (ref 0.44–1.00)
GFR, Est AFR Am: >60 mL/min (ref >60)
GFR, Estimated: >60 mL/min (ref >60)
GLUCOSE: 103 mg/dL — AB (ref 70–99)
POTASSIUM: 3.6 mmol/L (ref 3.5–5.1)
SODIUM: 140 mmol/L (ref 135–145)
Total Bilirubin: 0.4 mg/dL (ref 0.3–1.2)
Total Protein: 7 g/dL (ref 6.5–8.1)

## 2017-12-18 MED ORDER — ATROPINE SULFATE 1 MG/ML IJ SOLN
INTRAMUSCULAR | Status: AC
  Filled 2017-12-18: qty 1

## 2017-12-18 MED ORDER — SODIUM CHLORIDE 0.9 % IV SOLN
2400.0000 mg/m2 | INTRAVENOUS | Status: DC
  Administered 2017-12-18: 3750 mg via INTRAVENOUS
  Filled 2017-12-18: qty 75

## 2017-12-18 MED ORDER — MORPHINE SULFATE ER 15 MG PO TBCR: 15.0000 mg | EXTENDED_RELEASE_TABLET | Freq: Two times a day (BID) | ORAL | 0 refills | Status: DC

## 2017-12-18 MED ORDER — LEUCOVORIN CALCIUM INJECTION 350 MG
400.0000 mg/m2 | Freq: Once | INTRAVENOUS | Status: AC
  Administered 2017-12-18: 624 mg via INTRAVENOUS
  Filled 2017-12-18: qty 31.2

## 2017-12-18 MED ORDER — ATROPINE SULFATE 1 MG/ML IJ SOLN
0.5000 mg | Freq: Once | INTRAMUSCULAR | Status: AC | PRN
  Administered 2017-12-18: 0.5 mg via INTRAVENOUS

## 2017-12-18 MED ORDER — SODIUM CHLORIDE 0.9% FLUSH
10.0000 mL | INTRAVENOUS | Status: DC | PRN
  Administered 2017-12-18: 10 mL
  Filled 2017-12-18: qty 10

## 2017-12-18 MED ORDER — OXALIPLATIN CHEMO INJECTION 100 MG/20ML
85.0000 mg/m2 | Freq: Once | INTRAVENOUS | Status: AC
  Administered 2017-12-18: 135 mg via INTRAVENOUS
  Filled 2017-12-18: qty 20

## 2017-12-18 MED ORDER — DEXAMETHASONE SODIUM PHOSPHATE 10 MG/ML IJ SOLN
INTRAMUSCULAR | Status: AC
  Filled 2017-12-18: qty 1

## 2017-12-18 MED ORDER — PALONOSETRON HCL INJECTION 0.25 MG/5ML
0.2500 mg | Freq: Once | INTRAVENOUS | Status: AC
  Administered 2017-12-18: 0.25 mg via INTRAVENOUS

## 2017-12-18 MED ORDER — DEXTROSE 5 % IV SOLN
Freq: Once | INTRAVENOUS | Status: AC
  Administered 2017-12-18: 14:00:00 via INTRAVENOUS
  Filled 2017-12-18: qty 250

## 2017-12-18 MED ORDER — DEXTROSE 5 % IV SOLN
Freq: Once | INTRAVENOUS | Status: AC
  Administered 2017-12-18: 11:00:00 via INTRAVENOUS
  Filled 2017-12-18: qty 250

## 2017-12-18 MED ORDER — IRINOTECAN HCL CHEMO INJECTION 100 MG/5ML
150.0000 mg/m2 | Freq: Once | INTRAVENOUS | Status: AC
  Administered 2017-12-18: 240 mg via INTRAVENOUS
  Filled 2017-12-18: qty 12

## 2017-12-18 MED ORDER — DEXAMETHASONE SODIUM PHOSPHATE 10 MG/ML IJ SOLN
10.0000 mg | Freq: Once | INTRAMUSCULAR | Status: AC
  Administered 2017-12-18: 10 mg via INTRAVENOUS

## 2017-12-18 MED ORDER — PALONOSETRON HCL INJECTION 0.25 MG/5ML
INTRAVENOUS | Status: AC
  Filled 2017-12-18: qty 5

## 2017-12-18 MED FILL — MORPHINE SULF ER 15 MG TAB: 15 | 15 days supply | Qty: 30 | Fill #0

## 2017-12-18 NOTE — Patient Instructions (Signed)
[place] Discharge Instructions for Patients Receiving Chemotherapy  Today you received the following chemotherapy agents: Oxaliplatin, Leucovorin, Irinotecan, 5FU  To help prevent nausea and vomiting after your treatment, we encourage you to take your nausea medication as directed.   If you develop nausea and vomiting that is not controlled by your nausea medication, call the clinic.   BELOW ARE SYMPTOMS THAT SHOULD BE REPORTED IMMEDIATELY:  *FEVER GREATER THAN 100.5 F  *CHILLS WITH OR WITHOUT FEVER  NAUSEA AND VOMITING THAT IS NOT CONTROLLED WITH YOUR NAUSEA MEDICATION  *UNUSUAL SHORTNESS OF BREATH  *UNUSUAL BRUISING OR BLEEDING  TENDERNESS IN MOUTH AND THROAT WITH OR WITHOUT PRESENCE OF ULCERS  *URINARY PROBLEMS  *BOWEL PROBLEMS  UNUSUAL RASH Items with * indicate a potential emergency and should be followed up as soon as possible.  Feel free to call the clinic should you have any questions or concerns. The clinic phone number is [phone].  Please show the [place] at check-in to the Emergency Department and triage nurse.

## 2017-12-18 NOTE — Progress Notes (Signed)
[place] CSW Progress Notes  Referral from NP to discuss patient's imminent homelessness.  Called patient - per patient, she has been asked to leave her aunt's house next week and will be living in her car.  Patient has received letter stating that she will be receiving disability payments beginning in Jan 2020.  Would have no income until that time.  Advised that [employer] is working on her situation, is communicating w [employer] program and will have answer on whether patient can be served in their program by Friday. Patient also advised to discuss need for help w application fee for possible apartment w Estate manager/land agent.  Patient can use Estate agent to save on gas money.  Will also be referred to [facility] via [place] [facility] for additional help w locating affordable housing.  CSW will continue to monitor and touch base w patient.  Edwyna Shell, LCSW Clinical Social Worker Phone:  [phone]

## 2017-12-19 LAB — CANCER ANTIGEN 19-9: CA 19-9: 115 U/mL — ABNORMAL HIGH (ref 0–35)

## 2017-12-20 ENCOUNTER — Encounter: Payer: Self-pay | Admitting: General Practice

## 2017-12-20 ENCOUNTER — Inpatient Hospital Stay: Payer: Medicaid Other

## 2017-12-20 VITALS — BP 101/76 | HR 91 | Temp 98.6°F | Resp 18

## 2017-12-20 DIAGNOSIS — Z5111 Encounter for antineoplastic chemotherapy: Secondary | ICD-10-CM | POA: Diagnosis not present

## 2017-12-20 DIAGNOSIS — C250 Malignant neoplasm of head of pancreas: Secondary | ICD-10-CM

## 2017-12-20 MED ORDER — SODIUM CHLORIDE 0.9% FLUSH
10.0000 mL | INTRAVENOUS | Status: DC | PRN
  Administered 2017-12-20: 10 mL
  Filled 2017-12-20: qty 10

## 2017-12-20 MED ORDER — HEPARIN SOD (PORK) LOCK FLUSH 100 UNIT/ML IV SOLN
500.0000 [IU] | Freq: Once | INTRAVENOUS | Status: AC | PRN
  Administered 2017-12-20: 500 [IU]
  Filled 2017-12-20: qty 5

## 2017-12-20 MED ORDER — PEGFILGRASTIM-CBQV 6 MG/0.6ML ~~LOC~~ SOSY
6.0000 mg | PREFILLED_SYRINGE | Freq: Once | SUBCUTANEOUS | Status: AC
  Administered 2017-12-20: 6 mg via SUBCUTANEOUS

## 2017-12-20 MED ORDER — PEGFILGRASTIM-CBQV 6 MG/0.6ML ~~LOC~~ SOSY
PREFILLED_SYRINGE | SUBCUTANEOUS | Status: AC
  Filled 2017-12-20: qty 0.6

## 2017-12-20 NOTE — Progress Notes (Signed)
[place] CSW Progress Notes  Call from [employer], [employer].  Pt can be assessed for [employer] housing voucher program which would help w gaining access to housing.  Must meet criteria for homelessness at time of interview (living in shelter, outside, in lodging paid for by charitable organization).  Kathlyn [place] has identified a Product/process development scientist to assist w patient transitioning into housing program when she becomes actually homeless, asks that we keep in touch re patient's current needs.  CSW will notify patient of current status and efforts being made on her behalf.    Edwyna Shell, LCSW Clinical Social Worker Phone:  [phone]

## 2017-12-21 ENCOUNTER — Encounter: Payer: Self-pay | Admitting: General Practice

## 2017-12-21 NOTE — Progress Notes (Signed)
[place] Progress Notes  Per [name] (Congregational Nurse Program leader), patient has been accepted into [employer] which will provide lodging at [place] ([place]) from 12/8 - 12/29.  Per patient she must leave family home no later than 12/8.  Patient has also been initially screened as eligible for [employer] Program through [employer] [facility] [employer] (Bennita Curtain), she will be assigned caseworker and will be eligible for assistance locating and moving into affordable housing within the time frame of her hotel stay.  Program will continue to monitor patient for several months to ensure housing stability.  Per [facility] and patient, patient has been approved for disability w payments to begin Jan 2020.  MD, NP, Financial Advocate and Transportation coordinator advised of the above by SM.   Edwyna Shell, LCSW Clinical Social Worker Phone:  [phone]

## 2017-12-24 NOTE — Congregational Nurse Program (Signed)
Patient admitted into [facility] Program. Check in date to [facility] ([address] [place]):  12/24/17 ROOM 108 Patient signed: Patient Acceptance Form and [facility] Agreement $100 [employer] Gift Card given to patient.  VSS, Port-a-cath WNL (r. Chest), Pain level:  5, Wt. (Verbal from pt.) 119 Patient diagnosed with Pancreatic Cancer and began chemo in October 2019. Pain and nausea controlled with medications.  Food:  Does receive food stamps Medicaid & Disability:  Approved, will get 1st check 01/31/18 Transportation:  Has car and has does receive $25/month for gas from [place]  Patient tearful, however very thankful to be a part of the [employer], she stated she had her best night of sleep last night. Very active in trying to find permanent housing.  Reviewed HOPES agreement with pt., she verbalized understanding. Advised patient not to use public transportation and to eat healthy meals. Discussed plan of care and when she should reach out to her doctor.  [facility] SW assigned to patient.  Spiritual:  Prayed with patient.

## 2017-12-26 ENCOUNTER — Telehealth: Payer: Self-pay | Admitting: Hematology

## 2017-12-26 ENCOUNTER — Encounter: Payer: Self-pay | Admitting: General Practice

## 2017-12-26 NOTE — Telephone Encounter (Signed)
Spoke with patient regarding transportation help and she stated that she is in a good place right now and doesn't need it. I gave her my contact information in case she ever needs help.

## 2017-12-26 NOTE — Progress Notes (Signed)
[place] CSW Progress Notes  Letter prepared documenting patient's being served by [employer] provided and paid for by this program.  Letter provided to [employer], [facility], in order to qualify patient for Rapid Re[employer] program.  Edwyna Shell, [place] Social Worker Phone:  [phone]

## 2017-12-27 ENCOUNTER — Encounter: Payer: Self-pay | Admitting: General Practice

## 2017-12-27 ENCOUNTER — Inpatient Hospital Stay (HOSPITAL_BASED_OUTPATIENT_CLINIC_OR_DEPARTMENT_OTHER): Payer: Medicaid Other | Admitting: Genetics

## 2017-12-27 ENCOUNTER — Inpatient Hospital Stay: Payer: Medicaid Other

## 2017-12-27 DIAGNOSIS — Z7183 Encounter for nonprocreative genetic counseling: Secondary | ICD-10-CM

## 2017-12-27 DIAGNOSIS — C250 Malignant neoplasm of head of pancreas: Secondary | ICD-10-CM

## 2017-12-27 DIAGNOSIS — Z8042 Family history of malignant neoplasm of prostate: Secondary | ICD-10-CM

## 2017-12-27 DIAGNOSIS — Z8049 Family history of malignant neoplasm of other genital organs: Secondary | ICD-10-CM

## 2017-12-27 DIAGNOSIS — Z801 Family history of malignant neoplasm of trachea, bronchus and lung: Secondary | ICD-10-CM

## 2017-12-27 NOTE — Progress Notes (Signed)
[place] CSW Progress Notes  Patient referred to [employer] via [place] [facility] on 12/6 - provider contacted patient and emailed her list of 100 or more available properties in her price range based on disability income available mid January.  Edwyna Shell, LCSW Clinical Social Worker Phone:  [phone]

## 2017-12-28 ENCOUNTER — Telehealth: Payer: Self-pay

## 2017-12-28 ENCOUNTER — Encounter: Payer: Self-pay | Admitting: General Practice

## 2017-12-28 ENCOUNTER — Encounter: Payer: Self-pay | Admitting: Genetics

## 2017-12-28 DIAGNOSIS — Z801 Family history of malignant neoplasm of trachea, bronchus and lung: Secondary | ICD-10-CM | POA: Insufficient documentation

## 2017-12-28 DIAGNOSIS — Z8049 Family history of malignant neoplasm of other genital organs: Secondary | ICD-10-CM | POA: Insufficient documentation

## 2017-12-28 DIAGNOSIS — Z8042 Family history of malignant neoplasm of prostate: Secondary | ICD-10-CM | POA: Insufficient documentation

## 2017-12-28 NOTE — Progress Notes (Signed)
REFERRING PROVIDER: Truitt Merle, MD [place], [place] [address]  PRIMARY PROVIDER:  Patient, No Pcp Per  PRIMARY REASON FOR VISIT:  1. Malignant neoplasm of head of pancreas ([place])   2. Family history of uterine cancer   3. Family history of prostate cancer   4. Family history of lung cancer     HISTORY OF PRESENT ILLNESS:   Jane Brooks, a 47 y.o. female, was seen for a [facility] cancer genetics consultation at the request of Dr. Burr Medico due to a personal and family history of cancer.  Jane Brooks presents to clinic today to discuss the possibility of a hereditary predisposition to cancer, genetic testing, and to further clarify her future cancer risks, as well as potential cancer risks for family members.   In Oct 2019, at the age of 67, Jane Brooks was diagnosed with pancreatic cancer. She is currently undergoing chemotherapy.    CANCER HISTORY:  Oncology History   Cancer Staging Pancreatic cancer [facility]) Staging form: Exocrine Pancreas, AJCC 8th Edition - Clinical stage from 11/08/2017: Stage IB (cT2, cN0, cM0) - Signed by Truitt Merle, MD on 11/09/2017       Pancreatic cancer ([place])   10/30/2017 Imaging    MRI Abdomen 10/30/17  IMPRESSION: 1. Marked intra and extrahepatic biliary duct dilatation with abrupt cut off of the common bile duct at the pancreatic head. This is associated with marked dilatation of the main pancreatic duct also with an abrupt cut off at the level of the pancreatic head. Pancreatic parenchyma in the body and tail of pancreas is markedly atrophic. A subtle 2.5 x 3 cm lesion with differential signal intensity is identified in the head of the pancreas which appears slightly hypoenhancing on postcontrast imaging. Together, these features are highly concerning for pancreatic adenocarcinoma. EUS is recommended to further evaluate. 2. Portal vein is patent although there is some mass-effect on the portal splenic confluence and IVC. Fat  planes around celiac axis and SMA appear preserved. 3. Upper normal to borderline portal caval lymph node associated with small right para-aortic lymphadenopathy.      10/30/2017 Tumor Marker    Baseline Ca 19-9 at 140    10/31/2017 Procedure    ERCP by Dr. Lyndel Safe 10/31/17  IMPRESSION Malignant appearing CBD stricture (due to pancreatic mass) status post sphincterotomy and stent insertion.    11/08/2017 Cancer Staging    Staging form: Exocrine Pancreas, AJCC 8th Edition - Clinical stage from 11/08/2017: Stage IB (cT2, cN0, cM0) - Signed by Truitt Merle, MD on 11/09/2017    11/08/2017 Procedure    EUS and EGD by Dr. Ardis Hughs 11/08/17  IMPRESSION -Very vague, irregularly bordered, approximately 2.6cm mass was found in the head of pancreas. This causes biliary and pancreatic duct obstruction. The previously plastic biliary stent is in good position. The mass directly abuts and attenuates the PV/SMV confluence, suggesting invasion. FNA performed and the preliminary cytology review is positive for maligancy (adenocarcinoma). - [facility] will arrange referrals to medical and surgical oncology. - Case to be discussed at upcoming multidisciplinary GI tumor conference.    11/08/2017 Initial Biopsy    Biopsy Cytology 11/08/17 Diagnosis FINE NEEDLE ASPIRATION, ENDOSCOPIC, PANCREAS HEAD (SPECIMEN 1 OF 1 COLLECTED 11/08/17): MALIGNANT CELLS CONSISTENT WITH ADENOCARCINOMA.    11/09/2017 Initial Diagnosis    Pancreatic cancer ([place])    11/15/2017 Imaging    CT Chest and Pelvis WO Contrast 11/15/17 IMPRESSION: 1. No findings of active malignancy in the chest or pelvis. 2. Aortic  Atherosclerosis (ICD10-I70.0). Coronary atherosclerosis. 3. Faint centrilobular nodularity in the lung apices, raising the possibility of hypersensitivity pneumonitis or less likely respiratory bronchiolitis. Airway thickening is present, suggesting bronchitis or reactive airways disease.    11/19/2017 -   Chemotherapy    FOLFIRINOX q2weeks with Neulasta injection for 4 months starting 11/19/17      HORMONAL RISK FACTORS:  Menarche was at age 45.  First live birth at age N/A.  Ovaries intact: yes.  Hysterectomy: no.  Menopausal status: premenopausal.  Colonoscopy: no; not examined. Mammogram within the last year: no.   Past Medical History:  Diagnosis Date  . Family history of lung cancer   . Family history of prostate cancer   . Family history of uterine cancer   . Gallstones 10/2017  . Pancreatic cancer ([place])   . Pneumothorax, closed, traumatic    years ago    Past Surgical History:  Procedure Laterality Date  . BILIARY STENT PLACEMENT  10/31/2017   Procedure: BILIARY STENT PLACEMENT;  Surgeon: [name], MD;  Location: [facility] ENDOSCOPY;  Service: Gastroenterology;;  . ERCP N/A 10/31/2017   Procedure: ENDOSCOPIC RETROGRADE CHOLANGIOPANCREATOGRAPHY (ERCP);  Surgeon: [name], MD;  Location: [facility] ENDOSCOPY;  Service: Gastroenterology;  Laterality: N/A;  . ESOPHAGOGASTRODUODENOSCOPY N/A 11/08/2017   Procedure: ESOPHAGOGASTRODUODENOSCOPY (EGD);  Surgeon: Milus Banister, MD;  Location: [facility] ENDOSCOPY;  Service: Endoscopy;  Laterality: N/A;  . EUS N/A 11/08/2017   Procedure: UPPER ENDOSCOPIC ULTRASOUND (EUS) RADIAL;  Surgeon: Milus Banister, MD;  Location: [facility] ENDOSCOPY;  Service: Endoscopy;  Laterality: N/A;  . FINE NEEDLE ASPIRATION  11/08/2017   Procedure: FINE NEEDLE ASPIRATION;  Surgeon: Milus Banister, MD;  Location: [facility] ENDOSCOPY;  Service: Endoscopy;;  . IR IMAGING GUIDED PORT INSERTION  11/15/2017  . SPHINCTEROTOMY  10/31/2017   Procedure: SPHINCTEROTOMY;  Surgeon: [name], MD;  Location: [facility] ENDOSCOPY;  Service: Gastroenterology;;  . TONSILLECTOMY      Social History   Socioeconomic History  . Marital status: Single    Spouse name: Not on file  . Number of children: Not on file  . Years of education: Not on file  . Highest education level: Not on file   Occupational History  . Occupation: unemployed  Social Needs  . Financial resource strain: Not on file  . Food insecurity:    Worry: Not on file    Inability: Not on file  . Transportation needs:    Medical: Not on file    Non-medical: Not on file  Tobacco Use  . Smoking status: Current Every Day Smoker    Packs/day: 1.00    Years: 37.00    Pack years: 37.00    Types: Cigarettes  . Smokeless tobacco: Never Used  Substance and Sexual Activity  . Alcohol use: Not Currently  . Drug use: Never  . Sexual activity: Not Currently  Lifestyle  . Physical activity:    Days per week: Not on file    Minutes per session: Not on file  . Stress: Not on file  Relationships  . Social connections:    Talks on phone: Not on file    Gets together: Not on file    Attends religious service: Not on file    Active member of club or organization: Not on file    Attends meetings of clubs or organizations: Not on file    Relationship status: Not on file  Other Topics Concern  . Not on file  Social History Narrative  . Not on file  FAMILY HISTORY:  We obtained a detailed, 4-generation family history.  Significant diagnoses are listed below: Family History  Problem Relation Age of Onset  . Diabetes Mother   . Hypertension Mother   . COPD Mother   . Uterine cancer Mother 55       had hysterectomy  . Diabetes Sister   . Hypertension Sister   . Lung cancer Maternal Grandmother        lung cancer  . Hypertension Sister     Jane Brooks has no children.  She has twin sisters who are 30 with no hx of cancer.  She also has a maternal half-sister who is 51 with no family history of cancer.   Jane Brooks's father: unknown patient does not know any paternal family history.   Jane Brooks's mother: dx with uterine caner at 28, had a hysterectomy Maternal Aunts/Uncles: 1 maternal uncle- limited information avaialble Maternal cousins: 2 cousins, no info known Maternal grandfather: died  in his early 22's, had prostate cancer dx age unk Maternal grandmother:died of lung cancerin her early 44's, no hx of smoking, but worked in a factory  Jane Brooks is unaware of previous family history of genetic testing for hereditary cancer risks. Patient's maternal ancestors are of [place] and [place] descent, and paternal ancestors are of Black descent. There is no reported Ashkenazi Jewish ancestry. There is no known consanguinity.  GENETIC COUNSELING ASSESSMENT: Jane Brooks is a 47 y.o. female with a personal and family history which is somewhat suggestive of a Hereditary Cancer Predisposition Syndrome. We, therefore, discussed and recommended the following at today's visit.   DISCUSSION: We reviewed the characteristics, features and inheritance patterns of hereditary cancer syndromes. We also discussed genetic testing, including the appropriate family members to test, the process of testing, insurance coverage and turn-around-time for results. We discussed the implications of a negative, positive and/or variant of uncertain significant result. We recommended Jane Brooks pursue genetic testing for the Multi-Cancer gene panel.   The Multi-Cancer Panel offered by [facility] includes sequencing and/or deletion duplication testing of the following 90 genes: AIP, ALK, APC, ATM, AXIN2, BAP1, BARD1, BLM, BMPR1A, BRCA1, BRCA2, BRIP1, BUB1B, CASR, CDC73, CDH1, CDK4, CDKN1B, CDKN1C, CDKN2A, CEBPA, CHEK2, CTNNA1, DICER1, DIS3L2, EGFR, ENG, EPCAM, FH, FLCN, GALNT12, GATA2, GPC3, GREM1, HOXB13, HRAS, KIT, MAX, MEN1, MET, MITF, MLH1, MLH3, MSH2, MSH3, MSH6, MUTYH, NBN, NF1, NF2, NTHL1, PALB2, PDGFRA, PHOX2B, PMS2, POLD1, POLE, POT1, PRKAR1A, PTCH1, PTEN, RAD50, RAD51C, RAD51D, RB1, RECQL4, RET, RNF43, RPS20, RUNX1, SDHA, SDHAF2, SDHB, SDHC, SDHD, SMAD4, SMARCA4, SMARCB1, SMARCE1, STK11, SUFU, TERC, TERT, TMEM127, TP53, TSC1, TSC2, VHL, WRN, WT1  We discussed that only 5-10% of cancers are associated with  a Hereditary cancer predisposition syndrome.   One condition we discussed was Hereditary Breast and Ovarian Cancer (HBOC) syndrome.  This syndrome is caused by mutations in the BRCA1 and BRCA2 genes.  This syndrome increases an individual's lifetime risk to develop breast, ovarian, pancreatic, and other types of cancer.  We also briefly discussed Lynch Syndrome (given phx and fhx of pancreatic and uterine cancer) There are also many other cancer predisposition syndromes caused by mutations in several other genes.  We discussed that in some cases, a genetic test result may also inform treatment options (ex PARP-inhibitor).   We discussed that if she is found to have a mutation in one of these genes, it may impact future medical management recommendations such as increased cancer screenings and consideration of risk reducing surgeries.  A positive result could also  have implications for the patient's family members.  A Negative result would mean we were unable to identify a hereditary component to her cancer, but does not rule out the possibility of a hereditary basis for her cancer.  There could be mutations that are undetectable by current technology, or in genes not yet tested or identified to increase cancer risk.    We discussed the potential to find a Variant of Uncertain Significance or VUS.  These are variants that have not yet been identified as pathogenic or benign, and it is unknown if this variant is associated with increased cancer risk or if this is a normal finding.  Most VUS's are reclassified to benign or likely benign.   It should not be used to make medical management decisions. With time, we suspect the lab will determine the significance of any VUS's identified if any.   Based on Jane Brooks's personal and family history of cancer, she meets medical criteria for genetic testing. Despite that she meets criteria, she may still have an out of pocket cost. The laboratory can provide her  with an estimate of her OOP cost.  she was given the contact information for the laboratory if she has further questions. Marland Kitchen   PLAN: After considering the risks, benefits, and limitations, Jane Brooks  provided informed consent to pursue genetic testing.  She requested we obtain her blood at her next lab apt Wed 12/18.  At that time her blood will be sent to [facility] for analysis of the Multi-Cancer Panel. Results should be available within approximately 2-3 weeks' time, at which point they will be disclosed by telephone to Jane Brooks, as will any additional recommendations warranted by these results. Jane Brooks will receive a summary of her genetic counseling visit and a copy of her results once available. This information will also be available in Epic. We encouraged Jane Brooks to remain in contact with cancer genetics annually so that we can continuously update the family history and inform her of any changes in cancer genetics and testing that may be of benefit for her family. Jane Brooks's questions were answered to her satisfaction today. Our contact information was provided should additional questions or concerns arise.  Based on Jane Brooks family history, we recommended her mother, who was diagnosed with uterine cancer at age 60, have genetic counseling and testing. Jane Brooks will let us know if we can be of any assistance in coordinating genetic counseling and/or testing for this family member.   Lastly, we encouraged Ms. Wisener to remain in contact with cancer genetics annually so that we can continuously update the family history and inform her of any changes in cancer genetics and testing that may be of benefit for this family.   Ms.  Brooks's questions were answered to her satisfaction today. Our contact information was provided should additional questions or concerns arise. Thank you for the referral and allowing [place] to share in the care of your patient.    Tana Felts, MS, [facility] Certified Genetic Counselor [email or web address] phone: [phone]  The patient was seen for a total of 30 minutes in face-to-face genetic counseling.  This patient was discussed with Drs. Magrinat, Lindi Adie and/or Burr Medico who agrees with the above.

## 2017-12-28 NOTE — Telephone Encounter (Signed)
Jane Brooks, she states that she currently has no needs, received food stamps and went to the market to purchase food. Gift card supplied by [employer] was used for purchase toiletries. [employer] SW is planning to pick up patient to drive to various housing options today. No needs at this time. Patient continues to be very thankful for [employer] services being offered.  [name] [phone]

## 2017-12-31 ENCOUNTER — Telehealth: Payer: Self-pay

## 2017-12-31 NOTE — Telephone Encounter (Signed)
Telephone patient. States she is sore today, no other needs. Has chemo treatment on Wednesday. Plan is to go visit patient on Thursday, 01/03/18. Patient has visited [place], has submitted application and needed paperwork. Very hopeful she will be able to obtain an apartment at this location, monthly rent $545.00.

## 2018-01-01 NOTE — Progress Notes (Signed)
[place]   Telephone:([phone]) [phone] Fax:(336) [phone]   Clinic Follow up Note   Patient Care Team: Patient, No Pcp Per as PCP - General ([place]) Milus Banister, MD as Attending Physician (Gastroenterology) Truitt Merle, MD as Consulting Physician (Hematology) 01/02/2018  CHIEF COMPLAINT: F/u on pancreatic cancer   SUMMARY OF ONCOLOGIC HISTORY: Oncology History   Cancer Staging Pancreatic cancer [facility]) Staging form: Exocrine Pancreas, AJCC 8th Edition - Clinical stage from 11/08/2017: Stage IB (cT2, cN0, cM0) - Signed by Truitt Merle, MD on 11/09/2017       Pancreatic cancer ([place])   10/30/2017 Imaging    MRI Abdomen 10/30/17  IMPRESSION: 1. Marked intra and extrahepatic biliary duct dilatation with abrupt cut off of the common bile duct at the pancreatic head. This is associated with marked dilatation of the main pancreatic duct also with an abrupt cut off at the level of the pancreatic head. Pancreatic parenchyma in the body and tail of pancreas is markedly atrophic. A subtle 2.5 x 3 cm lesion with differential signal intensity is identified in the head of the pancreas which appears slightly hypoenhancing on postcontrast imaging. Together, these features are highly concerning for pancreatic adenocarcinoma. EUS is recommended to further evaluate. 2. Portal vein is patent although there is some mass-effect on the portal splenic confluence and IVC. Fat planes around celiac axis and SMA appear preserved. 3. Upper normal to borderline portal caval lymph node associated with small right para-aortic lymphadenopathy.      10/30/2017 Tumor Marker    Baseline Ca 19-9 at 140    10/31/2017 Procedure    ERCP by Dr. Lyndel Safe 10/31/17  IMPRESSION Malignant appearing CBD stricture (due to pancreatic mass) status post sphincterotomy and stent insertion.    11/08/2017 Cancer Staging    Staging form: Exocrine Pancreas, AJCC 8th Edition - Clinical stage from  11/08/2017: Stage IB (cT2, cN0, cM0) - Signed by Truitt Merle, MD on 11/09/2017    11/08/2017 Procedure    EUS and EGD by Dr. Ardis Hughs 11/08/17  IMPRESSION -Very vague, irregularly bordered, approximately 2.6cm mass was found in the head of pancreas. This causes biliary and pancreatic duct obstruction. The previously plastic biliary stent is in good position. The mass directly abuts and attenuates the PV/SMV confluence, suggesting invasion. FNA performed and the preliminary cytology review is positive for maligancy (adenocarcinoma). - [facility] will arrange referrals to medical and surgical oncology. - Case to be discussed at upcoming multidisciplinary GI tumor conference.    11/08/2017 Initial Biopsy    Biopsy Cytology 11/08/17 Diagnosis FINE NEEDLE ASPIRATION, ENDOSCOPIC, PANCREAS HEAD (SPECIMEN 1 OF 1 COLLECTED 11/08/17): MALIGNANT CELLS CONSISTENT WITH ADENOCARCINOMA.    11/09/2017 Initial Diagnosis    Pancreatic cancer ([place])    11/15/2017 Imaging    CT Chest and Pelvis WO Contrast 11/15/17 IMPRESSION: 1. No findings of active malignancy in the chest or pelvis. 2. Aortic Atherosclerosis (ICD10-I70.0). Coronary atherosclerosis. 3. Faint centrilobular nodularity in the lung apices, raising the possibility of hypersensitivity pneumonitis or less likely respiratory bronchiolitis. Airway thickening is present, suggesting bronchitis or reactive airways disease.    11/19/2017 -  Chemotherapy    FOLFIRINOX q2weeks with Neulasta injection for 4 months starting 11/19/17     CURRENT THERAPY neoadjuvantFOLFIRINOX q2weeks withUdenycainjection for 4 months starting 11/19/17   INTERVAL HISTORY: Jane Brooks is a 47 y.o. female who is here for follow-up. Today, she is here alone at the infusion room. She stays at a hotel now. She recently found an  apartment and will be moving soon. She has food stamps and was approved for disability. She would like to have a puppy for therapeutic  purposes. The dog she wants have gotten the required vaccines.  She noticed nausea and fatigue after last cycle. She is on Compazine 10 mg for nausea. She still doesn't eat well due to worsening nausea. She have not tried Ensure boost supplement. She is losing weight. She states that her surgeon recommended not eating heavy meals to avoid worsening abdominal pain. She takes MS contin for the abdominal pain. Her pain is 7/5-8/10 now and is sometimes associated with nausea.   Pertinent positives and negatives of review of systems are listed and detailed within the above HPI.  REVIEW OF SYSTEMS:   Constitutional: Denies fevers, chills (+) weight loss (+) fatigue Eyes: Denies blurriness of vision Ears, nose, mouth, throat, and face: Denies mucositis or sore throat Respiratory: Denies cough, dyspnea or wheezes Cardiovascular: Denies palpitation, chest discomfort or lower extremity swelling Gastrointestinal:  Denies heartburn or change in bowel habits (+) nausea Skin: Denies abnormal skin rashes Lymphatics: Denies new lymphadenopathy or easy bruising Neurological:Denies numbness, tingling or new weaknesses Behavioral/Psych: Mood is stable, no new changes  All other systems were reviewed with the patient and are negative.  MEDICAL HISTORY:  Past Medical History:  Diagnosis Date  . Family history of lung cancer   . Family history of prostate cancer   . Family history of uterine cancer   . Gallstones 10/2017  . Pancreatic cancer ([place])   . Pneumothorax, closed, traumatic    years ago    SURGICAL HISTORY: Past Surgical History:  Procedure Laterality Date  . BILIARY STENT PLACEMENT  10/31/2017   Procedure: BILIARY STENT PLACEMENT;  Surgeon: [name], MD;  Location: [facility] ENDOSCOPY;  Service: Gastroenterology;;  . ERCP N/A 10/31/2017   Procedure: ENDOSCOPIC RETROGRADE CHOLANGIOPANCREATOGRAPHY (ERCP);  Surgeon: [name], MD;  Location: [facility] ENDOSCOPY;  Service: Gastroenterology;   Laterality: N/A;  . ESOPHAGOGASTRODUODENOSCOPY N/A 11/08/2017   Procedure: ESOPHAGOGASTRODUODENOSCOPY (EGD);  Surgeon: Milus Banister, MD;  Location: [facility] ENDOSCOPY;  Service: Endoscopy;  Laterality: N/A;  . EUS N/A 11/08/2017   Procedure: UPPER ENDOSCOPIC ULTRASOUND (EUS) RADIAL;  Surgeon: Milus Banister, MD;  Location: [facility] ENDOSCOPY;  Service: Endoscopy;  Laterality: N/A;  . FINE NEEDLE ASPIRATION  11/08/2017   Procedure: FINE NEEDLE ASPIRATION;  Surgeon: Milus Banister, MD;  Location: [facility] ENDOSCOPY;  Service: Endoscopy;;  . IR IMAGING GUIDED PORT INSERTION  11/15/2017  . SPHINCTEROTOMY  10/31/2017   Procedure: SPHINCTEROTOMY;  Surgeon: [name], MD;  Location: [facility] ENDOSCOPY;  Service: Gastroenterology;;  . TONSILLECTOMY      I have reviewed the social history and family history with the patient and they are unchanged from previous note.  ALLERGIES:  is allergic to latex.  MEDICATIONS:  Current Outpatient Medications  Medication Sig Dispense Refill  . lidocaine-prilocaine (EMLA) cream Apply 1 application topically as needed. 30 g 2  . loperamide (IMODIUM) 2 MG capsule Take 2 capsules (4 mg total) by mouth every 6 (six) hours as needed for diarrhea or loose stools. 60 capsule 0  . loratadine (CLARITIN) 10 MG tablet Take 1 tablet (10 mg total) by mouth daily. Take for 5 days after the Udenyca injection (Patient taking differently: Take 10 mg by mouth See admin instructions. Take 10 mg by mouth daily for 5 days after the Udenyca injection) 30 tablet 2  . morphine (MS CONTIN) 15 MG 12 hr tablet Take 1 tablet (  15 mg total) by mouth every 12 (twelve) hours. 30 tablet 0  . morphine (MSIR) 15 MG tablet Take 0.5 tablets (7.5 mg total) by mouth every 6 (six) hours as needed for severe pain. 60 tablet 0  . ondansetron (ZOFRAN) 8 MG tablet Take 1 tablet (8 mg total) by mouth every 8 (eight) hours as needed for nausea or vomiting. 30 tablet 0  . prochlorperazine (COMPAZINE) 10 MG tablet Take 1  tablet (10 mg total) by mouth every 6 (six) hours as needed for nausea or vomiting. 30 tablet 3  . promethazine (PHENERGAN) 25 MG tablet Take 1 tablet (25 mg total) by mouth every 6 (six) hours as needed for nausea or vomiting. 30 tablet 0   No current facility-administered medications for this visit.     PHYSICAL EXAMINATION: ECOG PERFORMANCE STATUS: 1 - Symptomatic but completely ambulatory  Vitals: BP 104/72 Pulse 71 RR 18  GENERAL:alert, no distress and comfortable SKIN: skin color, texture, turgor are normal, no rashes or significant lesions EYES: normal, Conjunctiva are pink and non-injected, sclera clear OROPHARYNX:no exudate, no erythema and lips, buccal mucosa, and tongue normal  NECK: supple, thyroid normal size, non-tender, without nodularity LYMPH:  no palpable lymphadenopathy in the cervical, axillary or inguinal LUNGS: clear to auscultation and percussion with normal breathing effort HEART: regular rate & rhythm and no murmurs and no lower extremity edema ABDOMEN:abdomen soft, non-tender and normal bowel sounds Musculoskeletal:no cyanosis of digits and no clubbing  NEURO: alert & oriented x 3 with fluent speech, no focal motor/sensory deficits  LABORATORY DATA:  I have reviewed the data as listed CBC Latest Ref Rng & Units 01/02/2018 12/18/2017 12/12/2017  WBC 4.0 - 10.5 K/uL 9.9 10.5 29.0(H)  Hemoglobin 12.0 - 15.0 g/dL 10.5(L) 10.8(L) 10.3(L)  Hematocrit 36.0 - 46.0 % 33.4(L) 34.0(L) 33.3(L)  Platelets 150 - 400 K/uL 204 269 284     CMP Latest Ref Rng & Units 01/02/2018 12/18/2017 12/12/2017  Glucose 70 - 99 mg/dL 91 103(H) 96  BUN 6 - 20 mg/dL 10 7 9   Creatinine 0.44 - 1.00 mg/dL 0.60 0.68 0.44  Sodium 135 - 145 mmol/L 139 140 141  Potassium 3.5 - 5.1 mmol/L 3.8 3.6 2.8(L)  Chloride 98 - 111 mmol/L 105 104 111  CO2 22 - 32 mmol/L 26 28 21(L)  Calcium 8.9 - 10.3 mg/dL 8.8(L) 9.0 7.8(L)  Total Protein 6.5 - 8.1 g/dL 6.6 7.0 6.4(L)  Total Bilirubin 0.3 - 1.2  mg/dL 0.3 0.4 0.7  Alkaline Phos 38 - 126 U/L 114 109 111  AST 15 - 41 U/L 20 20 18   ALT 0 - 44 U/L 29 27 23       RADIOGRAPHIC STUDIES: I have personally reviewed the radiological images as listed and agreed with the findings in the report. No results found.   ASSESSMENT & PLAN:  Jane Brooks is a 47 y.o. female with history of  1. 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  Due to the borderline resectable tumor, we recommended neoadjuvant chemotherapy  -Crrently on neoadjuvant FOLFIRINOX with Udenyca on day 3 every 2 weeks. Tolerating moderately well.  -Labs reviewed, CBC showed Hg 10.5. CMP overall WNLs.  -she's been complaining of nausea, abdominal pain, and weight loss. I recommend her to take nutritional supplement with Ensure boost, we reviewed antiemetics. I will call in Zofran, and refill MS contin and morphine.  -Will add IV Emend before chemo -She requests a letter to have a puppy. I  educated her about risk of infections and advised her to f/u on the dog's vaccinations and take precautions while cleaning after it. Will write a letter for her. -She currently stays at a hotel, but will move to an apartment soon.  She is under social benefit -I will decrease chemo dose by 20% today due to nausea and abdominal pain. -Continue chemotherapy every 2 weeks, she will see nurse petitioner in 2 weeks -f/u 4 weeks with labs and restaging CT scan before   2. Abdominal pain, secondary to #1  -she is on MS contin 15 mg q12 but not very compliant, will continue MSIR 7.5 mg q6 PRN. I refilled  -pain overall not well controlled, I encouraged her to stay on MS Contin every 12 hours, and use morphine as needed  3. Social and Acupuncturist -I previously referred to SW. -She currently stays in a hotel, and will move to apartment, under the help of social service  4. 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 Genetics  -She was seen by the Dietitian. Genetic test was held due to lack of insurance .  6. Nausea and weight loss -She is not eating well and feels nauseated after eating, and is therefore losing weight. -She is currently on Compazine and Phenergan. I will prescribe Zofran.  -Add IV Emend before chemo - I advised her to take Ensure boost   Plan  -Labs reviewed, I will reduce chemo dose presents today, due to her nausea, poor appetite and weight loss  -I will prescribe Zofran -I will refill morphine and MS contin -Will write a letter for her to have a puppy -See NP Lattie Haw in 2 weeks, if nausea better, may increase chemo dose slightly  -f/u 4 weeks with labs with restaging CT scan before   No problem-specific Assessment & Plan notes found for this encounter.   Orders Placed This Encounter  Procedures  . CT Abdomen W Contrast    Pt is on neoadjuvant chemo, evaluate response    Standing Status:   Future    Standing Expiration Date:   01/02/2019    Order Specific Question:   If indicated for the ordered procedure, I authorize the administration of contrast media per Radiology protocol    Answer:   Yes    Order Specific Question:   Is patient pregnant?    Answer:   No    Order Specific Question:   Preferred imaging location?    Answer:   [facility]    Order Specific Question:   Is Oral Contrast requested for this exam?    Answer:   Yes, Per Radiology protocol    Order Specific Question:   Radiology Contrast Protocol - do NOT remove file path    Answer:   \\[email or web address]   All questions were answered. The patient knows to call the clinic with any problems, questions or concerns. No barriers to learning was detected. I spent 20 minutes counseling the patient face to face. The total time spent in the appointment was 25 minutes and more than 50% was on counseling and review of test results  I, Noor  Dweik am acting as scribe for Dr. Truitt Merle.  I have reviewed the above documentation for accuracy and completeness, and I agree with the above.     Truitt Merle, MD 01/02/2018

## 2018-01-02 ENCOUNTER — Encounter: Payer: Self-pay | Admitting: Hematology

## 2018-01-02 ENCOUNTER — Inpatient Hospital Stay (HOSPITAL_BASED_OUTPATIENT_CLINIC_OR_DEPARTMENT_OTHER): Payer: Medicaid Other | Admitting: Hematology

## 2018-01-02 ENCOUNTER — Inpatient Hospital Stay: Payer: Medicaid Other

## 2018-01-02 ENCOUNTER — Telehealth: Payer: Self-pay

## 2018-01-02 VITALS — BP 104/72 | HR 71 | Temp 97.8°F | Resp 18

## 2018-01-02 DIAGNOSIS — C250 Malignant neoplasm of head of pancreas: Secondary | ICD-10-CM | POA: Diagnosis not present

## 2018-01-02 DIAGNOSIS — G893 Neoplasm related pain (acute) (chronic): Secondary | ICD-10-CM

## 2018-01-02 DIAGNOSIS — Z79899 Other long term (current) drug therapy: Secondary | ICD-10-CM

## 2018-01-02 DIAGNOSIS — R110 Nausea: Secondary | ICD-10-CM

## 2018-01-02 DIAGNOSIS — Z809 Family history of malignant neoplasm, unspecified: Secondary | ICD-10-CM

## 2018-01-02 DIAGNOSIS — E876 Hypokalemia: Secondary | ICD-10-CM

## 2018-01-02 DIAGNOSIS — I700 Atherosclerosis of aorta: Secondary | ICD-10-CM

## 2018-01-02 DIAGNOSIS — D563 Thalassemia minor: Secondary | ICD-10-CM

## 2018-01-02 DIAGNOSIS — R918 Other nonspecific abnormal finding of lung field: Secondary | ICD-10-CM

## 2018-01-02 DIAGNOSIS — Z7689 Persons encountering health services in other specified circumstances: Secondary | ICD-10-CM

## 2018-01-02 DIAGNOSIS — R630 Anorexia: Secondary | ICD-10-CM

## 2018-01-02 DIAGNOSIS — Z5111 Encounter for antineoplastic chemotherapy: Secondary | ICD-10-CM | POA: Diagnosis not present

## 2018-01-02 DIAGNOSIS — R634 Abnormal weight loss: Secondary | ICD-10-CM

## 2018-01-02 DIAGNOSIS — D509 Iron deficiency anemia, unspecified: Secondary | ICD-10-CM

## 2018-01-02 LAB — CBC WITH DIFFERENTIAL (CANCER CENTER ONLY)
Abs Immature Granulocytes: 0.08 10*3/uL — ABNORMAL HIGH (ref 0.00–0.07)
Basophils Absolute: 0 10*3/uL (ref 0.0–0.1)
Basophils Relative: 0 %
Eosinophils Absolute: 0.2 10*3/uL (ref 0.0–0.5)
Eosinophils Relative: 2 %
HEMATOCRIT: 33.4 % — AB (ref 36.0–46.0)
Hemoglobin: 10.5 g/dL — ABNORMAL LOW (ref 12.0–15.0)
Immature Granulocytes: 1 %
Lymphocytes Relative: 22 %
Lymphs Abs: 2.2 10*3/uL (ref 0.7–4.0)
MCH: 20.2 pg — ABNORMAL LOW (ref 26.0–34.0)
MCHC: 31.4 g/dL (ref 30.0–36.0)
MCV: 64.4 fL — ABNORMAL LOW (ref 80.0–100.0)
MONOS PCT: 7 %
Monocytes Absolute: 0.7 10*3/uL (ref 0.1–1.0)
NEUTROS PCT: 68 %
Neutro Abs: 6.7 10*3/uL (ref 1.7–7.7)
Platelet Count: 204 10*3/uL (ref 150–400)
RBC: 5.19 MIL/uL — ABNORMAL HIGH (ref 3.87–5.11)
RDW: 17.1 % — ABNORMAL HIGH (ref 11.5–15.5)
WBC Count: 9.9 10*3/uL (ref 4.0–10.5)
nRBC: 0 % (ref 0.0–0.2)

## 2018-01-02 LAB — CMP (CANCER CENTER ONLY)
ALT: 29 U/L (ref 0–44)
AST: 20 U/L (ref 15–41)
Albumin: 3.5 g/dL (ref 3.5–5.0)
Alkaline Phosphatase: 114 U/L (ref 38–126)
Anion gap: 8 (ref 5–15)
BUN: 10 mg/dL (ref 6–20)
CHLORIDE: 105 mmol/L (ref 98–111)
CO2: 26 mmol/L (ref 22–32)
Calcium: 8.8 mg/dL — ABNORMAL LOW (ref 8.9–10.3)
Creatinine: 0.6 mg/dL (ref 0.44–1.00)
GFR, Est AFR Am: >60 mL/min (ref >60)
Glucose, Bld: 91 mg/dL (ref 70–99)
Potassium: 3.8 mmol/L (ref 3.5–5.1)
Sodium: 139 mmol/L (ref 135–145)
Total Bilirubin: 0.3 mg/dL (ref 0.3–1.2)
Total Protein: 6.6 g/dL (ref 6.5–8.1)

## 2018-01-02 MED ORDER — OXALIPLATIN CHEMO INJECTION 100 MG/20ML
65.0000 mg/m2 | Freq: Once | INTRAVENOUS | Status: DC
  Filled 2018-01-02: qty 20

## 2018-01-02 MED ORDER — DEXTROSE 5 % IV SOLN
Freq: Once | INTRAVENOUS | Status: AC
  Administered 2018-01-02: 13:00:00 via INTRAVENOUS
  Filled 2018-01-02: qty 250

## 2018-01-02 MED ORDER — DEXAMETHASONE SODIUM PHOSPHATE 10 MG/ML IJ SOLN
INTRAMUSCULAR | Status: AC
  Filled 2018-01-02: qty 1

## 2018-01-02 MED ORDER — MORPHINE SULFATE ER 15 MG PO TBCR: 15.0000 mg | EXTENDED_RELEASE_TABLET | Freq: Two times a day (BID) | ORAL | 0 refills | Status: DC

## 2018-01-02 MED ORDER — SODIUM CHLORIDE 0.9 % IV SOLN
150.0000 mg | Freq: Once | INTRAVENOUS | Status: DC
  Filled 2018-01-02: qty 5

## 2018-01-02 MED ORDER — PALONOSETRON HCL INJECTION 0.25 MG/5ML
INTRAVENOUS | Status: AC
  Filled 2018-01-02: qty 5

## 2018-01-02 MED ORDER — PALONOSETRON HCL INJECTION 0.25 MG/5ML
0.2500 mg | Freq: Once | INTRAVENOUS | Status: AC
  Administered 2018-01-02: 0.25 mg via INTRAVENOUS

## 2018-01-02 MED ORDER — LEUCOVORIN CALCIUM INJECTION 350 MG
320.0000 mg/m2 | Freq: Once | INTRAVENOUS | Status: DC
  Filled 2018-01-02: qty 25

## 2018-01-02 MED ORDER — LEUCOVORIN CALCIUM INJECTION 350 MG
400.0000 mg/m2 | Freq: Once | INTRAVENOUS | Status: DC
  Filled 2018-01-02: qty 31.2

## 2018-01-02 MED ORDER — SODIUM CHLORIDE 0.9 % IV SOLN
1920.0000 mg/m2 | INTRAVENOUS | Status: DC
  Filled 2018-01-02: qty 60

## 2018-01-02 MED ORDER — SODIUM CHLORIDE 0.9% FLUSH
10.0000 mL | INTRAVENOUS | Status: DC | PRN
  Administered 2018-01-02: 10 mL
  Filled 2018-01-02: qty 10

## 2018-01-02 MED ORDER — OXALIPLATIN CHEMO INJECTION 100 MG/20ML
85.0000 mg/m2 | Freq: Once | INTRAVENOUS | Status: DC
  Filled 2018-01-02: qty 27

## 2018-01-02 MED ORDER — IRINOTECAN HCL CHEMO INJECTION 100 MG/5ML
150.0000 mg/m2 | Freq: Once | INTRAVENOUS | Status: DC
  Filled 2018-01-02: qty 12

## 2018-01-02 MED ORDER — ONDANSETRON HCL 8 MG PO TABS: 8.0000 mg | ORAL_TABLET | Freq: Three times a day (TID) | ORAL | 0 refills | Status: DC | PRN

## 2018-01-02 MED ORDER — IRINOTECAN HCL CHEMO INJECTION 100 MG/5ML
120.0000 mg/m2 | Freq: Once | INTRAVENOUS | Status: DC
  Filled 2018-01-02: qty 9

## 2018-01-02 MED ORDER — MORPHINE SULFATE 15 MG PO TABS: 7.5000 mg | ORAL_TABLET | Freq: Four times a day (QID) | ORAL | 0 refills | Status: DC | PRN

## 2018-01-02 MED ORDER — SODIUM CHLORIDE 0.9 % IV SOLN
2400.0000 mg/m2 | INTRAVENOUS | Status: DC
  Filled 2018-01-02: qty 75

## 2018-01-02 MED ORDER — DEXAMETHASONE SODIUM PHOSPHATE 10 MG/ML IJ SOLN
10.0000 mg | Freq: Once | INTRAMUSCULAR | Status: AC
  Administered 2018-01-02: 10 mg via INTRAVENOUS

## 2018-01-02 MED FILL — ONDANSETRON HCL 8 MG TABLET: 8 | 10 days supply | Qty: 30 | Fill #0

## 2018-01-02 MED FILL — MORPHINE SULF ER 15 MG TAB: 15 | 15 days supply | Qty: 30 | Fill #0

## 2018-01-02 NOTE — Progress Notes (Signed)
Upon arrival to infusion room, pt c/o fatigue, nausea, and general malaise. Pt expressed frustration with her schedule and spoke with Merri Ray, Surveyor, quantity. After receiving aloxi and decadron as premedications (see MAR), pt expressed she did not want her treatment today, that she wanted to go home, and asked if there was any way she could come back a different day. This RN spoke with Threasa Beards and pharmacy as the chemotherapy was already prepared for today. Pharmacy agreed the chemo could be stored and stable in the refrigerator for up to 24 hours, and Melanie moved patient's treatment to tomorrow, 01/04/19 at 0730. Patient expressed gratitude and understanding of this appointment change. Chemotherapy returned to pharmacy for storage, patient left facility with port needle still in place, saline locked.

## 2018-01-02 NOTE — Progress Notes (Signed)
Per MD, doses of all chemo reduced by 20% for this cycle only.  Pt had significant fatigue/nausea. Kennith Center, Pharm.D., CPP 01/02/2018@1 :18 PM

## 2018-01-02 NOTE — Telephone Encounter (Signed)
Returned [employer] telephone call. Jurney went to receive Chemo today, after a long wait she did not feel well and reschedule her appt. To 12/19 at 7:30am. Patient is experiencing N/V and is achy, however each day symptoms decrease. Was able to see her doctor,strength of chemo will be reduced going forward. Claiborne Billings requested appt. with me to be rescheduled to Friday, 12/20 versus tomorrow due to appt changes. Patient has received notification she will be obtaining an apartment through [employer], move in date unknown at this time. Plan to extend motel reservation until 01/20/18.

## 2018-01-03 ENCOUNTER — Inpatient Hospital Stay: Payer: Medicaid Other

## 2018-01-03 ENCOUNTER — Other Ambulatory Visit: Payer: Self-pay | Admitting: Hematology

## 2018-01-03 VITALS — BP 113/73 | HR 77 | Temp 98.1°F | Resp 16

## 2018-01-03 DIAGNOSIS — Z5111 Encounter for antineoplastic chemotherapy: Secondary | ICD-10-CM | POA: Diagnosis not present

## 2018-01-03 DIAGNOSIS — C250 Malignant neoplasm of head of pancreas: Secondary | ICD-10-CM

## 2018-01-03 MED ORDER — IRINOTECAN HCL CHEMO INJECTION 100 MG/5ML
120.0000 mg/m2 | Freq: Once | INTRAVENOUS | Status: AC
  Administered 2018-01-03: 180 mg via INTRAVENOUS
  Filled 2018-01-03: qty 9

## 2018-01-03 MED ORDER — LEUCOVORIN CALCIUM INJECTION 350 MG
320.0000 mg/m2 | Freq: Once | INTRAVENOUS | Status: AC
  Administered 2018-01-03: 500 mg via INTRAVENOUS
  Filled 2018-01-03: qty 25

## 2018-01-03 MED ORDER — ATROPINE SULFATE 1 MG/ML IJ SOLN
0.5000 mg | Freq: Once | INTRAMUSCULAR | Status: AC | PRN
  Administered 2018-01-03: 0.5 mg via INTRAVENOUS

## 2018-01-03 MED ORDER — DEXTROSE 5 % IV SOLN
Freq: Once | INTRAVENOUS | Status: AC
Start: 2018-01-03 — End: 2018-01-03
  Administered 2018-01-03: 08:00:00 via INTRAVENOUS
  Filled 2018-01-03: qty 250

## 2018-01-03 MED ORDER — FOSAPREPITANT DIMEGLUMINE INJECTION 150 MG
Freq: Once | INTRAVENOUS | Status: AC
Start: 2018-01-03 — End: 2018-01-03
  Administered 2018-01-03: 09:00:00 via INTRAVENOUS
  Filled 2018-01-03: qty 5

## 2018-01-03 MED ORDER — ATROPINE SULFATE 1 MG/ML IJ SOLN
INTRAMUSCULAR | Status: AC
  Filled 2018-01-03: qty 1

## 2018-01-03 MED ORDER — DEXTROSE 5 % IV SOLN
Freq: Once | INTRAVENOUS | Status: AC
  Administered 2018-01-03: 08:00:00 via INTRAVENOUS
  Filled 2018-01-03: qty 250

## 2018-01-03 MED ORDER — SODIUM CHLORIDE 0.9 % IV SOLN
1920.0000 mg/m2 | INTRAVENOUS | Status: DC
  Administered 2018-01-03: 3000 mg via INTRAVENOUS
  Filled 2018-01-03: qty 60

## 2018-01-03 MED ORDER — OXALIPLATIN CHEMO INJECTION 100 MG/20ML
65.0000 mg/m2 | Freq: Once | INTRAVENOUS | Status: AC
  Administered 2018-01-03: 100 mg via INTRAVENOUS
  Filled 2018-01-03: qty 20

## 2018-01-03 NOTE — Patient Instructions (Signed)
Jane Brooks Discharge Instructions for Patients Receiving Chemotherapy  Today you received the following chemotherapy agents: Oxaliplatin, Leucovorin, Irinotecan, 5FU  To help prevent nausea and vomiting after your treatment, we encourage you to take your nausea medication as directed.   If you develop nausea and vomiting that is not controlled by your nausea medication, call the clinic.   BELOW ARE SYMPTOMS THAT SHOULD BE REPORTED IMMEDIATELY:  *FEVER GREATER THAN 100.5 F  *CHILLS WITH OR WITHOUT FEVER  NAUSEA AND VOMITING THAT IS NOT CONTROLLED WITH YOUR NAUSEA MEDICATION  *UNUSUAL SHORTNESS OF BREATH  *UNUSUAL BRUISING OR BLEEDING  TENDERNESS IN MOUTH AND THROAT WITH OR WITHOUT PRESENCE OF ULCERS  *URINARY PROBLEMS  *BOWEL PROBLEMS  UNUSUAL RASH Items with * indicate a potential emergency and should be followed up as soon as possible.  Feel free to call the clinic should you have any questions or concerns. The clinic phone number is [phone].  Please show the [facility] at check-in to the Emergency Department and triage nurse.

## 2018-01-04 ENCOUNTER — Inpatient Hospital Stay: Payer: Medicaid Other

## 2018-01-04 NOTE — Congregational Nurse Program (Signed)
  Dept: [facility] Nurse Program Note  Date of Encounter: 01/04/2018  Past Medical History: Past Medical History:  Diagnosis Date  . Family history of lung cancer   . Family history of prostate cancer   . Family history of uterine cancer   . Gallstones 10/2017  . Pancreatic cancer ([place])   . Pneumothorax, closed, traumatic    years ago    Encounter Details: CNP Questionnaire - 01/04/18 1120      Questionnaire   Patient Status  Not Applicable    Race  Black or African American    Location Patient Served At  [employer] patient    Insurance  Not Applicable    Uninsured  Uninsured (NEW 1x/quarter)    Food  No food insecurities    Housing/Utilities  No permanent housing    Transportation  No transportation needs    Interpersonal Safety  Yes, feel physically and emotionally safe where you currently live    Medication  Yes, have medication insecurities    Medical Provider  No    Referrals  Other    ED Visit Averted  Not Applicable    Life-Saving Intervention Made  Not Applicable      Patient seen at [facility] by HOPES RN. VSS, port WNL, pain 5/10 managed with pain meds. Patient states feeling good today and no complaints. 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 Decreased N/V. New apartment being inspected today, will receive confirmation of move in date most likely within 1 week. New address will be [address] [place], [place] [address]. Will connect patient with [employer] regarding furniture for apartment.

## 2018-01-05 ENCOUNTER — Inpatient Hospital Stay: Payer: Medicaid Other

## 2018-01-05 VITALS — BP 118/75 | HR 88 | Temp 98.1°F | Resp 18 | Ht 59.5 in

## 2018-01-05 DIAGNOSIS — C250 Malignant neoplasm of head of pancreas: Secondary | ICD-10-CM

## 2018-01-05 DIAGNOSIS — Z5111 Encounter for antineoplastic chemotherapy: Secondary | ICD-10-CM | POA: Diagnosis not present

## 2018-01-05 MED ORDER — PEGFILGRASTIM-CBQV 6 MG/0.6ML ~~LOC~~ SOSY
6.0000 mg | PREFILLED_SYRINGE | Freq: Once | SUBCUTANEOUS | Status: AC
  Administered 2018-01-05: 6 mg via SUBCUTANEOUS

## 2018-01-05 MED ORDER — HEPARIN SOD (PORK) LOCK FLUSH 100 UNIT/ML IV SOLN
500.0000 [IU] | Freq: Once | INTRAVENOUS | Status: AC | PRN
  Administered 2018-01-05: 500 [IU]
  Filled 2018-01-05: qty 5

## 2018-01-05 MED ORDER — SODIUM CHLORIDE 0.9% FLUSH
10.0000 mL | INTRAVENOUS | Status: DC | PRN
  Administered 2018-01-05: 10 mL
  Filled 2018-01-05: qty 10

## 2018-01-05 MED ORDER — PEGFILGRASTIM-CBQV 6 MG/0.6ML ~~LOC~~ SOSY
PREFILLED_SYRINGE | SUBCUTANEOUS | Status: AC
  Filled 2018-01-05: qty 0.6

## 2018-01-05 NOTE — Patient Instructions (Signed)
Pegfilgrastim injection  What is this medicine?  PEGFILGRASTIM (PEG fil gra stim) is a long-acting granulocyte colony-stimulating factor that stimulates the growth of neutrophils, a type of white blood cell important in the body's fight against infection. It is used to reduce the incidence of fever and infection in patients with certain types of cancer who are receiving chemotherapy that affects the bone marrow, and to increase survival after being exposed to high doses of radiation.  This medicine may be used for other purposes; ask your health care provider or pharmacist if you have questions.  COMMON BRAND NAME(S): Fulphila, Neulasta, UDENYCA  What should I tell my health care provider before I take this medicine?  They need to know if you have any of these conditions:  -kidney disease  -latex allergy  -ongoing radiation therapy  -sickle cell disease  -skin reactions to acrylic adhesives (On-Body Injector only)  -an unusual or allergic reaction to pegfilgrastim, filgrastim, other medicines, foods, dyes, or preservatives  -pregnant or trying to get pregnant  -breast-feeding  How should I use this medicine?  This medicine is for injection under the skin. If you get this medicine at home, you will be taught how to prepare and give the pre-filled syringe or how to use the On-body Injector. Refer to the patient Instructions for Use for detailed instructions. Use exactly as directed. Tell your healthcare provider immediately if you suspect that the On-body Injector may not have performed as intended or if you suspect the use of the On-body Injector resulted in a missed or partial dose.  It is important that you put your used needles and syringes in a special sharps container. Do not put them in a trash can. If you do not have a sharps container, call your pharmacist or healthcare provider to get one.  Talk to your pediatrician regarding the use of this medicine in children. While this drug may be prescribed for  selected conditions, precautions do apply.  Overdosage: If you think you have taken too much of this medicine contact a poison control center or emergency room at once.  NOTE: This medicine is only for you. Do not share this medicine with others.  What if I miss a dose?  It is important not to miss your dose. Call your doctor or health care professional if you miss your dose. If you miss a dose due to an On-body Injector failure or leakage, a new dose should be administered as soon as possible using a single prefilled syringe for manual use.  What may interact with this medicine?  Interactions have not been studied.  Give your health care provider a list of all the medicines, herbs, non-prescription drugs, or dietary supplements you use. Also tell them if you smoke, drink alcohol, or use illegal drugs. Some items may interact with your medicine.  This list may not describe all possible interactions. Give your health care provider a list of all the medicines, herbs, non-prescription drugs, or dietary supplements you use. Also tell them if you smoke, drink alcohol, or use illegal drugs. Some items may interact with your medicine.  What should I watch for while using this medicine?  You may need blood work done while you are taking this medicine.  If you are going to need a MRI, CT scan, or other procedure, tell your doctor that you are using this medicine (On-Body Injector only).  What side effects may I notice from receiving this medicine?  Side effects that you should report to   your doctor or health care professional as soon as possible:  -allergic reactions like skin rash, itching or hives, swelling of the face, lips, or tongue  -back pain  -dizziness  -fever  -pain, redness, or irritation at site where injected  -pinpoint red spots on the skin  -red or dark-brown urine  -shortness of breath or breathing problems  -stomach or side pain, or pain at the shoulder  -swelling  -tiredness  -trouble passing urine or  change in the amount of urine  Side effects that usually do not require medical attention (report to your doctor or health care professional if they continue or are bothersome):  -bone pain  -muscle pain  This list may not describe all possible side effects. Call your doctor for medical advice about side effects. You may report side effects to FDA at 1-800-FDA-1088.  Where should I keep my medicine?  Keep out of the reach of children.  If you are using this medicine at home, you will be instructed on how to store it. Throw away any unused medicine after the expiration date on the label.  NOTE: This sheet is a summary. It may not cover all possible information. If you have questions about this medicine, talk to your doctor, pharmacist, or health care provider.   2019 Elsevier/Gold Standard (2017-04-09 16:57:08)

## 2018-01-07 NOTE — Congregational Nurse Program (Signed)
  Dept: [facility] Nurse Program Note  Date of Encounter: 01/07/2018  Past Medical History: Past Medical History:  Diagnosis Date  . Family history of lung cancer   . Family history of prostate cancer   . Family history of uterine cancer   . Gallstones 10/2017  . Pancreatic cancer ([place])   . Pneumothorax, closed, traumatic    years ago    Encounter Details: CNP Questionnaire - 01/07/18 1607      Questionnaire   Patient Status  Not Applicable    Race  Black or African American    Location Patient Served At  Not Applicable    Insurance  Not Applicable    Uninsured  Uninsured (NEW 1x/quarter)    Food  No food insecurities    Housing/Utilities  No permanent housing    Transportation  No transportation needs    Interpersonal Safety  Yes, feel physically and emotionally safe where you currently live    Medication  No medication insecurities    Medical Provider  Yes    Referrals  Not Applicable    ED Visit Averted  Not Applicable    Life-Saving Intervention Made  Not Applicable      Patient doing well, Port WNL, N/V has decreased. Extended motel stay until 01/20/18. Patient's apartment was inspected today, unable to sign lease until after the holidays.

## 2018-01-10 MED FILL — MORPHINE SULFATE IR 15 MG T: 15 | 30 days supply | Qty: 60 | Fill #0

## 2018-01-11 ENCOUNTER — Telehealth: Payer: Self-pay

## 2018-01-11 NOTE — Telephone Encounter (Signed)
Gave patient contrast and instructions, along with CT contact number. Per 12/27 walk in

## 2018-01-14 ENCOUNTER — Inpatient Hospital Stay: Payer: Medicaid Other

## 2018-01-14 ENCOUNTER — Encounter: Payer: Self-pay | Admitting: Nurse Practitioner

## 2018-01-14 ENCOUNTER — Inpatient Hospital Stay (HOSPITAL_BASED_OUTPATIENT_CLINIC_OR_DEPARTMENT_OTHER): Payer: Medicaid Other | Admitting: Nurse Practitioner

## 2018-01-14 DIAGNOSIS — Z809 Family history of malignant neoplasm, unspecified: Secondary | ICD-10-CM

## 2018-01-14 DIAGNOSIS — C250 Malignant neoplasm of head of pancreas: Secondary | ICD-10-CM

## 2018-01-14 DIAGNOSIS — D563 Thalassemia minor: Secondary | ICD-10-CM

## 2018-01-14 DIAGNOSIS — Z7689 Persons encountering health services in other specified circumstances: Secondary | ICD-10-CM | POA: Diagnosis not present

## 2018-01-14 DIAGNOSIS — Z79899 Other long term (current) drug therapy: Secondary | ICD-10-CM

## 2018-01-14 DIAGNOSIS — D509 Iron deficiency anemia, unspecified: Secondary | ICD-10-CM

## 2018-01-14 DIAGNOSIS — Z5111 Encounter for antineoplastic chemotherapy: Secondary | ICD-10-CM | POA: Diagnosis not present

## 2018-01-14 DIAGNOSIS — Z95828 Presence of other vascular implants and grafts: Secondary | ICD-10-CM

## 2018-01-14 DIAGNOSIS — G893 Neoplasm related pain (acute) (chronic): Secondary | ICD-10-CM

## 2018-01-14 DIAGNOSIS — I700 Atherosclerosis of aorta: Secondary | ICD-10-CM

## 2018-01-14 DIAGNOSIS — R918 Other nonspecific abnormal finding of lung field: Secondary | ICD-10-CM

## 2018-01-14 LAB — CMP (CANCER CENTER ONLY)
ALT: 124 U/L — ABNORMAL HIGH (ref 0–44)
AST: 46 U/L — AB (ref 15–41)
Albumin: 3.4 g/dL — ABNORMAL LOW (ref 3.5–5.0)
Alkaline Phosphatase: 215 U/L — ABNORMAL HIGH (ref 38–126)
Anion gap: 9 (ref 5–15)
BUN: 5 mg/dL — AB (ref 6–20)
CO2: 26 mmol/L (ref 22–32)
Calcium: 9 mg/dL (ref 8.9–10.3)
Chloride: 105 mmol/L (ref 98–111)
Creatinine: 0.78 mg/dL (ref 0.44–1.00)
GFR, Est AFR Am: >60 mL/min (ref >60)
GFR, Estimated: >60 mL/min (ref >60)
Glucose, Bld: 105 mg/dL — ABNORMAL HIGH (ref 70–99)
Potassium: 3.4 mmol/L — ABNORMAL LOW (ref 3.5–5.1)
Sodium: 140 mmol/L (ref 135–145)
Total Bilirubin: 0.3 mg/dL (ref 0.3–1.2)
Total Protein: 6.7 g/dL (ref 6.5–8.1)

## 2018-01-14 LAB — CBC WITH DIFFERENTIAL (CANCER CENTER ONLY)
ABS IMMATURE GRANULOCYTES: 0.1 10*3/uL — AB (ref 0.00–0.07)
Basophils Absolute: 0 10*3/uL (ref 0.0–0.1)
Basophils Relative: 0 %
Eosinophils Absolute: 0.3 10*3/uL (ref 0.0–0.5)
Eosinophils Relative: 3 %
HCT: 32 % — ABNORMAL LOW (ref 36.0–46.0)
Hemoglobin: 10 g/dL — ABNORMAL LOW (ref 12.0–15.0)
Immature Granulocytes: 1 %
LYMPHS PCT: 22 %
Lymphs Abs: 2.5 10*3/uL (ref 0.7–4.0)
MCH: 20.2 pg — ABNORMAL LOW (ref 26.0–34.0)
MCHC: 31.3 g/dL (ref 30.0–36.0)
MCV: 64.5 fL — ABNORMAL LOW (ref 80.0–100.0)
MONO ABS: 0.7 10*3/uL (ref 0.1–1.0)
Monocytes Relative: 6 %
Neutro Abs: 8 10*3/uL — ABNORMAL HIGH (ref 1.7–7.7)
Neutrophils Relative %: 68 %
Platelet Count: 204 10*3/uL (ref 150–400)
RBC: 4.96 MIL/uL (ref 3.87–5.11)
RDW: 17.7 % — ABNORMAL HIGH (ref 11.5–15.5)
WBC Count: 11.7 10*3/uL — ABNORMAL HIGH (ref 4.0–10.5)
nRBC: 0 % (ref 0.0–0.2)

## 2018-01-14 MED ORDER — HEPARIN SOD (PORK) LOCK FLUSH 100 UNIT/ML IV SOLN
500.0000 [IU] | Freq: Once | INTRAVENOUS | Status: DC
  Filled 2018-01-14: qty 5

## 2018-01-14 MED ORDER — HEPARIN SOD (PORK) LOCK FLUSH 100 UNIT/ML IV SOLN
500.0000 [IU] | Freq: Once | INTRAVENOUS | Status: AC | PRN
  Administered 2018-01-14: 500 [IU]
  Filled 2018-01-14: qty 5

## 2018-01-14 MED ORDER — MORPHINE SULFATE ER 15 MG PO TBCR: 15.0000 mg | EXTENDED_RELEASE_TABLET | Freq: Two times a day (BID) | ORAL | 0 refills | Status: DC

## 2018-01-14 MED ORDER — SODIUM CHLORIDE 0.9% FLUSH
10.0000 mL | INTRAVENOUS | Status: DC | PRN
  Administered 2018-01-14: 10 mL
  Filled 2018-01-14: qty 10

## 2018-01-14 MED FILL — MORPHINE SULF ER 15 MG TAB: 15 | 30 days supply | Qty: 60 | Fill #0

## 2018-01-14 NOTE — Progress Notes (Signed)
  [facility] OFFICE PROGRESS NOTE   Diagnosis: Pancreas cancer  INTERVAL HISTORY:   Ms. Jane Brooks returns as scheduled.  She completed cycle 4 FOLFIRINOX 01/03/2018.  She tolerated this cycle much better than the previous one.  She had significantly less nausea.  More energy.  No mouth sores.  No diarrhea.  The cold sensitivity lasted 3 or 4 days.  No persistent neuropathy symptoms.  She continues to have abdominal pain.  She takes MS Contin twice a day and MSIR as needed.  Objective:  Vital signs in last 24 hours:  Blood pressure 124/80, pulse 89, temperature 98.3 F (36.8 C), temperature source Oral, resp. rate 18, height 4' 11"  (1.499 m), weight 123 lb 12.8 oz (56.2 kg), SpO2 99 %.    HEENT: No thrush or ulcers. Resp: Lungs clear bilaterally. Cardio: Regular rate and rhythm. GI: Abdomen with diffuse mild tenderness.  No hepatomegaly. Vascular: No leg edema. Neuro: Vibratory sense intact over the fingertips per tuning fork exam. Skin: Palms without erythema. Port-A-Cath without erythema.   Lab Results:  Lab Results  Component Value Date   WBC 9.9 01/02/2018   HGB 10.5 (L) 01/02/2018   HCT 33.4 (L) 01/02/2018   MCV 64.4 (L) 01/02/2018   PLT 204 01/02/2018   NEUTROABS 6.7 01/02/2018    Imaging:  No results found.  Medications: I have reviewed the patient's current medications.  Assessment/Plan: 1. 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 Abdominal pain secondary to #1 3. 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 Genetics.  She has been seen by the genetics counselor. 5. Nausea and weight loss.  Disposition: Ms. [name] has completed 4 cycles of FOLFIRINOX.  The chemotherapy was dose reduced with cycle 4.  She noted improved tolerance.  Plan to proceed with cycle 5 as scheduled 01/15/2018.  We reviewed the CBC from today.  Counts are adequate for treatment.  She continues MS Contin with MSIR as  needed.  A new MS Contin prescription will be sent to her pharmacy.  She is scheduled for restaging CT scans 01/28/2018.  She will return for a follow-up visit 01/30/2018.  She will contact the office in the interim with any problems.  Plan reviewed with Dr. Burr Medico.    Ned Card ANP/GNP-BC   01/14/2018  1:36 PM

## 2018-01-15 ENCOUNTER — Telehealth: Payer: Self-pay | Admitting: Emergency Medicine

## 2018-01-15 ENCOUNTER — Other Ambulatory Visit: Payer: Medicaid Other

## 2018-01-15 ENCOUNTER — Ambulatory Visit: Payer: Medicaid Other | Admitting: Nurse Practitioner

## 2018-01-15 ENCOUNTER — Inpatient Hospital Stay: Payer: Medicaid Other

## 2018-01-15 ENCOUNTER — Telehealth: Payer: Self-pay | Admitting: *Deleted

## 2018-01-15 DIAGNOSIS — Z5111 Encounter for antineoplastic chemotherapy: Secondary | ICD-10-CM | POA: Diagnosis not present

## 2018-01-15 DIAGNOSIS — C250 Malignant neoplasm of head of pancreas: Secondary | ICD-10-CM

## 2018-01-15 LAB — CANCER ANTIGEN 19-9: CA 19-9: 84 U/mL — ABNORMAL HIGH (ref 0–35)

## 2018-01-15 MED ORDER — HEPARIN SOD (PORK) LOCK FLUSH 100 UNIT/ML IV SOLN
500.0000 [IU] | Freq: Once | INTRAVENOUS | Status: AC | PRN
  Administered 2018-01-15: 500 [IU]
  Filled 2018-01-15: qty 5

## 2018-01-15 MED ORDER — SODIUM CHLORIDE 0.9% FLUSH
10.0000 mL | INTRAVENOUS | Status: DC | PRN
  Administered 2018-01-15: 10 mL
  Filled 2018-01-15: qty 10

## 2018-01-15 NOTE — Telephone Encounter (Signed)
[name] and [name] reviewed pt's CBC & CMP from 12/30.  Due to pt's elevated liver enzymes they want to hold treatment until next week when they can reevaluate her.  Called pt who verbalized understanding and agreement to this plan.  Pt to come in at original appt time to have port deaccessed.  Denies any further questions or concerns at this time.

## 2018-01-15 NOTE — Telephone Encounter (Signed)
Per Ned Card, NP cancel today's appt due to elevated liver enzymes.  Notified infusion so that they can call patient to advise.  Rescheduling message sent to move infusion from today out 1 week per Ned Card, add lab/md followup/infusion and pump start stop since she is folfirinox.

## 2018-01-15 NOTE — Patient Instructions (Signed)

## 2018-01-15 NOTE — Progress Notes (Signed)
Pt arrived for port deaccess only.  Denies any concerns, states she feels fine.  Scheduling to call pt to reschedule for next week.  Verbalized understanding to call with any issues or concerns.

## 2018-01-17 ENCOUNTER — Other Ambulatory Visit: Payer: Self-pay | Admitting: Nurse Practitioner

## 2018-01-17 ENCOUNTER — Telehealth: Payer: Self-pay | Admitting: Hematology

## 2018-01-17 ENCOUNTER — Inpatient Hospital Stay: Payer: Medicaid Other

## 2018-01-17 NOTE — Telephone Encounter (Signed)
Left message for patient re appointments for 1/8 and 1/10. Message to LT/YF re adjusting remaining appointments.

## 2018-01-18 ENCOUNTER — Telehealth: Payer: Self-pay | Admitting: Genetics

## 2018-01-18 NOTE — Congregational Nurse Program (Signed)
  Dept: [facility] Nurse Program Note  Date of Encounter: 01/18/2018  Past Medical History: Past Medical History:  Diagnosis Date  . Family history of lung cancer   . Family history of prostate cancer   . Family history of uterine cancer   . Gallstones 10/2017  . Pancreatic cancer ([place])   . Pneumothorax, closed, traumatic    years ago    Encounter Details: CNP Questionnaire - 01/18/18 1257      Questionnaire   Patient Status  Not Applicable    Race  Black or African American    Location Patient Served At  [facility] patient    Insurance  Not Applicable    Uninsured  Uninsured (Subsequent visits/quarter)    Food  No food insecurities    Housing/Utilities  No permanent housing    Transportation  No transportation needs    Interpersonal Safety  Yes, feel physically and emotionally safe where you currently live    Medication  No medication insecurities    Medical Provider  Yes    Referrals  Not Applicable    ED Visit Averted  Not Applicable    Life-Saving Intervention Made  Not Applicable     VSS, Port site WNL, no c/o pain or N/V. Is experiencing constipation, will purchase Miralax and stool softener per MD instructions. In good spirits today, has a new puppy which offers her company and something to focus on other than her disease. [facility] appointment on 01/31/18 to pick out furniture for new apartment, will be delivered on the 16th.

## 2018-01-18 NOTE — Telephone Encounter (Signed)
Revealed negative genetic testing.   This normal result is reassuring and indicates that it is unlikely Jane Brooks's cancer is due to a hereditary cause.  It is unlikely that there is an increased risk of another cancer due to a mutation in one of these genes.  However, genetic testing is not perfect, and cannot definitively rule out a hereditary cause.  It will be important for her to keep in contact with genetics to learn if any additional testing may be needed in the future.     We recommended she continue following her doctors recommendations for cancer treatment and screening.  We recommended her mother (and maternal relatives if she declines) have genetic testing as well due to her mother's dx of uterine cancer in her 45's.

## 2018-01-20 ENCOUNTER — Telehealth: Payer: Self-pay | Admitting: Hematology

## 2018-01-20 ENCOUNTER — Other Ambulatory Visit: Payer: Self-pay | Admitting: Hematology

## 2018-01-20 NOTE — Telephone Encounter (Signed)
Per 1/2 schedule message patient cannot come 1/8 and 1/10 call to reschedule. Spoke with patient today and per patient she can keep appointments as scheduled for 1/8 and 1/10. Patient made aware remaining appointments would be adjusted (per staff message from APP) to be q2w from 1/8 and she is to get an updated schedule at 1/8 visit.

## 2018-01-21 ENCOUNTER — Other Ambulatory Visit: Payer: Self-pay | Admitting: Hematology

## 2018-01-22 ENCOUNTER — Other Ambulatory Visit: Payer: Self-pay | Admitting: Hematology

## 2018-01-23 ENCOUNTER — Inpatient Hospital Stay: Payer: Medicaid Other

## 2018-01-23 ENCOUNTER — Inpatient Hospital Stay: Payer: Medicaid Other | Attending: Hematology

## 2018-01-23 ENCOUNTER — Ambulatory Visit (HOSPITAL_BASED_OUTPATIENT_CLINIC_OR_DEPARTMENT_OTHER): Payer: Medicaid Other | Admitting: Medical

## 2018-01-23 VITALS — BP 122/72 | HR 76 | Temp 98.0°F | Resp 18 | Ht 59.0 in | Wt 124.2 lb

## 2018-01-23 DIAGNOSIS — F17210 Nicotine dependence, cigarettes, uncomplicated: Secondary | ICD-10-CM | POA: Insufficient documentation

## 2018-01-23 DIAGNOSIS — C250 Malignant neoplasm of head of pancreas: Secondary | ICD-10-CM | POA: Insufficient documentation

## 2018-01-23 DIAGNOSIS — Z8041 Family history of malignant neoplasm of ovary: Secondary | ICD-10-CM | POA: Diagnosis not present

## 2018-01-23 DIAGNOSIS — Z8042 Family history of malignant neoplasm of prostate: Secondary | ICD-10-CM

## 2018-01-23 DIAGNOSIS — R109 Unspecified abdominal pain: Secondary | ICD-10-CM

## 2018-01-23 DIAGNOSIS — Z800 Family history of malignant neoplasm of digestive organs: Secondary | ICD-10-CM | POA: Diagnosis not present

## 2018-01-23 DIAGNOSIS — Z5111 Encounter for antineoplastic chemotherapy: Secondary | ICD-10-CM

## 2018-01-23 DIAGNOSIS — Z801 Family history of malignant neoplasm of trachea, bronchus and lung: Secondary | ICD-10-CM | POA: Diagnosis not present

## 2018-01-23 LAB — CBC WITH DIFFERENTIAL (CANCER CENTER ONLY)
ABS IMMATURE GRANULOCYTES: 0.02 10*3/uL (ref 0.00–0.07)
BASOS PCT: 1 %
Basophils Absolute: 0 10*3/uL (ref 0.0–0.1)
Eosinophils Absolute: 0.2 10*3/uL (ref 0.0–0.5)
Eosinophils Relative: 3 %
HCT: 32.2 % — ABNORMAL LOW (ref 36.0–46.0)
Hemoglobin: 10.1 g/dL — ABNORMAL LOW (ref 12.0–15.0)
Immature Granulocytes: 0 %
Lymphocytes Relative: 37 %
Lymphs Abs: 2.1 10*3/uL (ref 0.7–4.0)
MCH: 20.2 pg — AB (ref 26.0–34.0)
MCHC: 31.4 g/dL (ref 30.0–36.0)
MCV: 64.4 fL — ABNORMAL LOW (ref 80.0–100.0)
Monocytes Absolute: 0.7 10*3/uL (ref 0.1–1.0)
Monocytes Relative: 12 %
Neutro Abs: 2.5 10*3/uL (ref 1.7–7.7)
Neutrophils Relative %: 47 %
Platelet Count: 269 10*3/uL (ref 150–400)
RBC: 5 MIL/uL (ref 3.87–5.11)
RDW: 18.1 % — ABNORMAL HIGH (ref 11.5–15.5)
WBC Count: 5.5 10*3/uL (ref 4.0–10.5)
nRBC: 0 % (ref 0.0–0.2)

## 2018-01-23 LAB — CMP (CANCER CENTER ONLY)
ALT: 72 U/L — AB (ref 0–44)
AST: 65 U/L — ABNORMAL HIGH (ref 15–41)
Albumin: 3.4 g/dL — ABNORMAL LOW (ref 3.5–5.0)
Alkaline Phosphatase: 292 U/L — ABNORMAL HIGH (ref 38–126)
Anion gap: 10 (ref 5–15)
BUN: 6 mg/dL (ref 6–20)
CO2: 24 mmol/L (ref 22–32)
Calcium: 8.8 mg/dL — ABNORMAL LOW (ref 8.9–10.3)
Chloride: 105 mmol/L (ref 98–111)
Creatinine: 0.59 mg/dL (ref 0.44–1.00)
GFR, Est AFR Am: >60 mL/min (ref >60)
GFR, Estimated: >60 mL/min (ref >60)
Glucose, Bld: 108 mg/dL — ABNORMAL HIGH (ref 70–99)
Potassium: 3.8 mmol/L (ref 3.5–5.1)
SODIUM: 139 mmol/L (ref 135–145)
Total Bilirubin: 0.3 mg/dL (ref 0.3–1.2)
Total Protein: 6.8 g/dL (ref 6.5–8.1)

## 2018-01-23 MED ORDER — LEUCOVORIN CALCIUM INJECTION 350 MG
320.0000 mg/m2 | Freq: Once | INTRAVENOUS | Status: AC
  Administered 2018-01-23: 500 mg via INTRAVENOUS
  Filled 2018-01-23: qty 25

## 2018-01-23 MED ORDER — DEXAMETHASONE SODIUM PHOSPHATE 10 MG/ML IJ SOLN
INTRAMUSCULAR | Status: AC
  Filled 2018-01-23: qty 1

## 2018-01-23 MED ORDER — ATROPINE SULFATE 1 MG/ML IJ SOLN: 0.5000 mg | Freq: Once | INTRAMUSCULAR | Status: DC | PRN

## 2018-01-23 MED ORDER — DEXTROSE 5 % IV SOLN
Freq: Once | INTRAVENOUS | Status: AC
  Administered 2018-01-23: 10:00:00 via INTRAVENOUS
  Filled 2018-01-23: qty 250

## 2018-01-23 MED ORDER — PALONOSETRON HCL INJECTION 0.25 MG/5ML
INTRAVENOUS | Status: AC
  Filled 2018-01-23: qty 5

## 2018-01-23 MED ORDER — OXALIPLATIN CHEMO INJECTION 100 MG/20ML
65.0000 mg/m2 | Freq: Once | INTRAVENOUS | Status: AC
  Administered 2018-01-23: 100 mg via INTRAVENOUS
  Filled 2018-01-23: qty 20

## 2018-01-23 MED ORDER — PALONOSETRON HCL INJECTION 0.25 MG/5ML
0.2500 mg | Freq: Once | INTRAVENOUS | Status: AC
  Administered 2018-01-23: 0.25 mg via INTRAVENOUS

## 2018-01-23 MED ORDER — SODIUM CHLORIDE 0.9 % IV SOLN
2000.0000 mg/m2 | INTRAVENOUS | Status: DC
  Administered 2018-01-23: 3100 mg via INTRAVENOUS
  Filled 2018-01-23: qty 62

## 2018-01-23 MED ORDER — IRINOTECAN HCL CHEMO INJECTION 100 MG/5ML
120.0000 mg/m2 | Freq: Once | INTRAVENOUS | Status: AC
  Administered 2018-01-23: 180 mg via INTRAVENOUS
  Filled 2018-01-23: qty 9

## 2018-01-23 MED ORDER — SODIUM CHLORIDE 0.9 % IV SOLN
Freq: Once | INTRAVENOUS | Status: AC
  Administered 2018-01-23: 11:00:00 via INTRAVENOUS
  Filled 2018-01-23: qty 5

## 2018-01-23 MED ORDER — ATROPINE SULFATE 1 MG/ML IJ SOLN
INTRAMUSCULAR | Status: AC
  Filled 2018-01-23: qty 1

## 2018-01-23 MED ORDER — DEXAMETHASONE SODIUM PHOSPHATE 10 MG/ML IJ SOLN
10.0000 mg | Freq: Once | INTRAMUSCULAR | Status: AC
  Administered 2018-01-23: 10 mg via INTRAVENOUS

## 2018-01-23 NOTE — Progress Notes (Signed)
Notified by Priscella Mann, PharmD that pt should be seen in infusion room today by provider approximately 20 minutes after orders released.  Unable to reach Dr. Burr Medico.  Sandi Mealy, PA to infusion room to see pt.  Pharmacy notified ok to treat.  Emend received one hour after release time.  Per pt, she "never got emend until last time, and I don't want to wait on it, plus 30 minutes after.  I have been here since 8:30 and we haven't even started the actual treatment yet."  Emend stopped after 3 minutes of infusion.  Per Sandi Mealy ok for pt to receive aloxi and dex 69m as in previous treatments.  After 30 minute wait on Dexamethasone, pharmacy delivered irinotecan and leucovorin, oxaliplatin not present after 1hr and 40 minutes of being released.  Pt expressed excessive wait times in past and stated she had left before due to long wait times.  Pt did not want to wait for oxaliplatin to arrive and asked for irinotecan/leuco to hang or else she would leave again due to excessive wait times.  Explained to pt that this regimen is hung that way for colon cancer pt and not for pancreatic.  Pt was very angry at this point, decision made to hang irinotecan/leuco prior to oxaliplatin in the interest of keeping the patient present for treatment/at pt request

## 2018-01-23 NOTE — Patient Instructions (Signed)

## 2018-01-23 NOTE — Patient Instructions (Signed)
[facility] Discharge Instructions for Patients Receiving Chemotherapy  Today you received the following chemotherapy agents: Oxaliplatin, Leucovorin, Irinotecan, 5FU  To help prevent nausea and vomiting after your treatment, we encourage you to take your nausea medication as directed.   If you develop nausea and vomiting that is not controlled by your nausea medication, call the clinic.   BELOW ARE SYMPTOMS THAT SHOULD BE REPORTED IMMEDIATELY:  *FEVER GREATER THAN 100.5 F  *CHILLS WITH OR WITHOUT FEVER  NAUSEA AND VOMITING THAT IS NOT CONTROLLED WITH YOUR NAUSEA MEDICATION  *UNUSUAL SHORTNESS OF BREATH  *UNUSUAL BRUISING OR BLEEDING  TENDERNESS IN MOUTH AND THROAT WITH OR WITHOUT PRESENCE OF ULCERS  *URINARY PROBLEMS  *BOWEL PROBLEMS  UNUSUAL RASH Items with * indicate a potential emergency and should be followed up as soon as possible.  Feel free to call the clinic should you have any questions or concerns. The clinic phone number is [phone].  Please show the [facility] at check-in to the Emergency Department and triage nurse.

## 2018-01-23 NOTE — Progress Notes (Signed)
Chemo doses reviewed with MD on 01/22/18 for 01/23/18. Confirmed slight increase in 5FU CIV dose.  Hardie Pulley, PharmD, BCPS, BCOP/G. Berline Lopes, PharmD, CPP

## 2018-01-23 NOTE — Progress Notes (Signed)
Jane Brooks saw pt in infusion area for f/u today. No issues. OK to proceed with treatment today.  Hardie Pulley, PharmD, BCPS, BCOP

## 2018-01-23 NOTE — Progress Notes (Signed)
Pt refused Emend. She feels it adds too much time to her treatments. She would like to go back to Aloxi and Dex 2m. Orders changed for future treatments as requested. Emend discontinued today and for future orders.  Pt did receive 3 minutes of Emend infusion today before it was stopped.  BHardie Pulley PharmD, BCPS, BCOP

## 2018-01-24 ENCOUNTER — Encounter: Payer: Self-pay | Admitting: Genetics

## 2018-01-24 ENCOUNTER — Ambulatory Visit: Payer: Self-pay | Admitting: Genetics

## 2018-01-24 DIAGNOSIS — Z8042 Family history of malignant neoplasm of prostate: Secondary | ICD-10-CM

## 2018-01-24 DIAGNOSIS — Z8049 Family history of malignant neoplasm of other genital organs: Secondary | ICD-10-CM

## 2018-01-24 DIAGNOSIS — C250 Malignant neoplasm of head of pancreas: Secondary | ICD-10-CM

## 2018-01-24 DIAGNOSIS — Z801 Family history of malignant neoplasm of trachea, bronchus and lung: Secondary | ICD-10-CM

## 2018-01-24 DIAGNOSIS — Z1379 Encounter for other screening for genetic and chromosomal anomalies: Secondary | ICD-10-CM

## 2018-01-24 NOTE — Progress Notes (Signed)
HPI:  Ms. Nuon was previously seen in the [facility] clinic on 12/27/2017 due to a personal and family history of cancer and concerns regarding a hereditary predisposition to cancer. Please refer to our prior cancer genetics clinic note for more information regarding Ms. Simmers's medical, social and family histories, and our assessment and recommendations, at the time. Ms. Sidney's recent genetic test results were disclosed to her, as well as recommendations warranted by these results. These results and recommendations are discussed in more detail below.  CANCER HISTORY:  Oncology History   Cancer Staging Pancreatic cancer [facility]) Staging form: Exocrine Pancreas, AJCC 8th Edition - Clinical stage from 11/08/2017: Stage IB (cT2, cN0, cM0) - Signed by Truitt Merle, MD on 11/09/2017       Pancreatic cancer ([place])   10/30/2017 Imaging    MRI Abdomen 10/30/17  IMPRESSION: 1. Marked intra and extrahepatic biliary duct dilatation with abrupt cut off of the common bile duct at the pancreatic head. This is associated with marked dilatation of the main pancreatic duct also with an abrupt cut off at the level of the pancreatic head. Pancreatic parenchyma in the body and tail of pancreas is markedly atrophic. A subtle 2.5 x 3 cm lesion with differential signal intensity is identified in the head of the pancreas which appears slightly hypoenhancing on postcontrast imaging. Together, these features are highly concerning for pancreatic adenocarcinoma. EUS is recommended to further evaluate. 2. Portal vein is patent although there is some mass-effect on the portal splenic confluence and IVC. Fat planes around celiac axis and SMA appear preserved. 3. Upper normal to borderline portal caval lymph node associated with small right para-aortic lymphadenopathy.      10/30/2017 Tumor Marker    Baseline Ca 19-9 at 140    10/31/2017 Procedure    ERCP by Dr. 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    11/08/2017 Cancer Staging    Staging form: Exocrine Pancreas, AJCC 8th Edition - Clinical stage from 11/08/2017: Stage IB (cT2, cN0, cM0) - Signed by Truitt Merle, MD on 11/09/2017    11/08/2017 Procedure    EUS and EGD by Dr. Ardis Hughs 11/08/17  IMPRESSION -Very vague, irregularly bordered, approximately 2.6cm mass was found in the head of pancreas. This causes biliary and pancreatic duct obstruction. The previously plastic biliary stent is in good position. The mass directly abuts and attenuates the PV/SMV confluence, suggesting invasion. FNA performed and the preliminary cytology review is positive for maligancy (adenocarcinoma). - [facility] office will arrange referrals to medical and surgical oncology. - Case to be discussed at upcoming multidisciplinary GI tumor conference.    11/08/2017 Initial Biopsy    Biopsy Cytology 11/08/17 Diagnosis FINE NEEDLE ASPIRATION, ENDOSCOPIC, PANCREAS HEAD (SPECIMEN 1 OF 1 COLLECTED 11/08/17): MALIGNANT CELLS CONSISTENT WITH ADENOCARCINOMA.    11/09/2017 Initial Diagnosis    Pancreatic cancer ([place])    11/15/2017 Imaging    CT Chest and Pelvis WO Contrast 11/15/17 IMPRESSION: 1. No findings of active malignancy in the chest or pelvis. 2. Aortic Atherosclerosis (ICD10-I70.0). Coronary atherosclerosis. 3. Faint centrilobular nodularity in the lung apices, raising the possibility of hypersensitivity pneumonitis or less likely respiratory bronchiolitis. Airway thickening is present, suggesting bronchitis or reactive airways disease.    11/19/2017 -  Chemotherapy    FOLFIRINOX q2weeks with Neulasta injection for 4 months starting 11/19/17      FAMILY HISTORY:  We obtained a detailed, 4-generation family history.  Significant diagnoses are listed below: Family History  Problem Relation Age of Onset  . Diabetes Mother   .  Hypertension Mother   . COPD Mother   . Uterine cancer Mother 48       had hysterectomy  . Diabetes Sister   . Hypertension Sister   . Lung cancer Maternal Grandmother        lung cancer  . Hypertension Sister     Ms. Dalpe has no children.  She has twin sisters who are 34 with no hx of cancer.  She also has a maternal half-sister who is 65 with no family history of cancer.   Ms. Salvucci's father: unknown patient does not know any paternal family history.   Ms. Dascenzo's mother: dx with uterine caner at 82, had a hysterectomy Maternal Aunts/Uncles: 1 maternal uncle- limited information avaialble Maternal cousins: 2 cousins, no info known Maternal grandfather: died in his early 73's, had prostate cancer dx age unk Maternal grandmother:died of lung cancerin her early 76's, no hx of smoking, but worked in a factory  Ms. Chauca is unaware of previous family history of genetic testing for hereditary cancer risks. Patient's maternal ancestors are of [place] and [place] descent, and paternal ancestors are of Black descent. There is no reported Ashkenazi Jewish ancestry. There is no known consanguinity.  GENETIC TEST RESULTS: Genetic testing performed through [facility]'s Multi-Cancer Panel reported out on 01/10/2018 showed no pathogenic mutations.   The Multi-Cancer Panel offered by [facility] includes sequencing and/or deletion duplication testing of the following 90 genes: AIP, ALK, APC, ATM, AXIN2, BAP1, BARD1, BLM, BMPR1A, BRCA1, BRCA2, BRIP1, BUB1B, CASR, CDC73, CDH1, CDK4, CDKN1B, CDKN1C, CDKN2A, CEBPA, CHEK2, CTNNA1, DICER1, DIS3L2, EGFR, ENG, EPCAM, FH, FLCN, GALNT12, GATA2, GPC3, GREM1, HOXB13, HRAS, KIT, MAX, MEN1, MET, MITF, MLH1, MLH3, MSH2, MSH3, MSH6, MUTYH, NBN, NF1, NF2, NTHL1, PALB2, PDGFRA, PHOX2B, PMS2, POLD1, POLE, POT1, PRKAR1A, PTCH1, PTEN, RAD50, RAD51C, RAD51D, RB1, RECQL4, RET, RNF43, RPS20, RUNX1, SDHA, SDHAF2, SDHB, SDHC, SDHD, SMAD4, SMARCA4, SMARCB1, SMARCE1,  STK11, SUFU, TERC, TERT, TMEM127, TP53, TSC1, TSC2, VHL, WRN, WT1  The test report will be scanned into EPIC and will be located under the Molecular Pathology section of the Results Review tab. A portion of the result report is included below for reference.     We discussed with Ms. Vankirk that because current genetic testing is not perfect, it is possible there may be a gene mutation in one of these genes that current testing cannot detect, but that chance is small.  We also discussed, that there could be another gene that has not yet been discovered, or that we have not yet tested, that is responsible for the cancer diagnoses in the family. It is also possible there is a hereditary cause for the cancer in the family that Ms. Buchan did not inherit and therefore was not identified in her testing.  Therefore, it is important to remain in touch with cancer genetics in the future so that we can continue to offer Ms. Ressler the most up to date genetic testing.   ADDITIONAL GENETIC TESTING: We discussed with Ms. Mccreadie that her genetic testing was fairly extensive.  If there are are genes identified to increase cancer risk that can be analyzed in the future, we would be happy to discuss and coordinate this testing at that time.    CANCER SCREENING RECOMMENDATIONS: Ms. Mcclain's test result is negative (normal).  This means that we have not identified a hereditary cause for her personal and  family history of cancer at this time.   However, genetic testing is not perfect and does not definitively rule out a hereditary predisposition to cancer. It is still possible that there could be genetic mutations that are undetectable by current technology, or genetic mutations in genes that have not been tested or identified to increase cancer risk.  Therefore, it is recommended she continue to follow the cancer management and screening guidelines provided by her oncology and primary healthcare provider.  An individual's cancer risk is not determined by genetic test results alone.  Overall cancer risk assessment includes additional factors such as personal medical history, family history, etc.  These should be used to make a personalized plan for cancer prevention and surveillance.    RECOMMENDATIONS FOR FAMILY MEMBERS:  Relatives in this family might be at some increased risk of developing cancer, over the general population risk, simply due to the family history of cancer.  We recommended women in this family have a yearly mammogram beginning at age 36, or 10 years younger than the earliest onset of cancer, an annual clinical breast exam, and perform monthly breast self-exams. Women in this family should also have a gynecological exam as recommended by their primary provider. All family members should have a colonoscopy as directed by their healthcare providers.  All family members should inform their physicians about the family history of cancer so their doctors can make the most appropriate screening recommendations for them.   It is also possible there is a hereditary cause for the cancer in Ms. Walle's family that she did not inherit and therefore was not identified in her.  Therefore, we recommended mother/maternal relatives also have genetic counseling and testing. Ms. Berke will let us know if we can be of any assistance in coordinating genetic counseling and/or testing for these family members.   FOLLOW-UP: Lastly, we discussed with Ms. Terris that cancer genetics is a rapidly advancing field and it is possible that new genetic tests will be appropriate for her and/or her family members in the future. We encouraged her to remain in contact with cancer genetics on an annual basis so we can update her personal and family histories and let her know of advances in cancer genetics that may benefit this family.   Our contact number was provided. Ms. Olver's questions were answered to her  satisfaction, and she knows she is welcome to call us at anytime with additional questions or concerns.   Ferol Luz, MS, [facility] Certified Genetic Counselor [email or web address]

## 2018-01-25 ENCOUNTER — Telehealth: Payer: Self-pay

## 2018-01-25 ENCOUNTER — Inpatient Hospital Stay: Payer: Medicaid Other

## 2018-01-25 VITALS — BP 113/73 | HR 87 | Temp 98.2°F | Resp 18

## 2018-01-25 DIAGNOSIS — Z5111 Encounter for antineoplastic chemotherapy: Secondary | ICD-10-CM | POA: Diagnosis not present

## 2018-01-25 DIAGNOSIS — C250 Malignant neoplasm of head of pancreas: Secondary | ICD-10-CM

## 2018-01-25 MED ORDER — HEPARIN SOD (PORK) LOCK FLUSH 100 UNIT/ML IV SOLN
500.0000 [IU] | Freq: Once | INTRAVENOUS | Status: AC | PRN
  Administered 2018-01-25: 500 [IU]
  Filled 2018-01-25: qty 5

## 2018-01-25 MED ORDER — PEGFILGRASTIM-CBQV 6 MG/0.6ML ~~LOC~~ SOSY
PREFILLED_SYRINGE | SUBCUTANEOUS | Status: AC
  Filled 2018-01-25: qty 0.6

## 2018-01-25 MED ORDER — SODIUM CHLORIDE 0.9% FLUSH
10.0000 mL | INTRAVENOUS | Status: DC | PRN
  Administered 2018-01-25: 10 mL
  Filled 2018-01-25: qty 10

## 2018-01-25 MED ORDER — PEGFILGRASTIM-CBQV 6 MG/0.6ML ~~LOC~~ SOSY
6.0000 mg | PREFILLED_SYRINGE | Freq: Once | SUBCUTANEOUS | Status: AC
  Administered 2018-01-25: 6 mg via SUBCUTANEOUS

## 2018-01-25 NOTE — Patient Instructions (Signed)
Pegfilgrastim injection  What is this medicine?  PEGFILGRASTIM (PEG fil gra stim) is a long-acting granulocyte colony-stimulating factor that stimulates the growth of neutrophils, a type of white blood cell important in the body's fight against infection. It is used to reduce the incidence of fever and infection in patients with certain types of cancer who are receiving chemotherapy that affects the bone marrow, and to increase survival after being exposed to high doses of radiation.  This medicine may be used for other purposes; ask your health care provider or pharmacist if you have questions.  COMMON BRAND NAME(S): Fulphila, Neulasta, UDENYCA  What should I tell my health care provider before I take this medicine?  They need to know if you have any of these conditions:  -kidney disease  -latex allergy  -ongoing radiation therapy  -sickle cell disease  -skin reactions to acrylic adhesives (On-Body Injector only)  -an unusual or allergic reaction to pegfilgrastim, filgrastim, other medicines, foods, dyes, or preservatives  -pregnant or trying to get pregnant  -breast-feeding  How should I use this medicine?  This medicine is for injection under the skin. If you get this medicine at home, you will be taught how to prepare and give the pre-filled syringe or how to use the On-body Injector. Refer to the patient Instructions for Use for detailed instructions. Use exactly as directed. Tell your healthcare provider immediately if you suspect that the On-body Injector may not have performed as intended or if you suspect the use of the On-body Injector resulted in a missed or partial dose.  It is important that you put your used needles and syringes in a special sharps container. Do not put them in a trash can. If you do not have a sharps container, call your pharmacist or healthcare provider to get one.  Talk to your pediatrician regarding the use of this medicine in children. While this drug may be prescribed for  selected conditions, precautions do apply.  Overdosage: If you think you have taken too much of this medicine contact a poison control center or emergency room at once.  NOTE: This medicine is only for you. Do not share this medicine with others.  What if I miss a dose?  It is important not to miss your dose. Call your doctor or health care professional if you miss your dose. If you miss a dose due to an On-body Injector failure or leakage, a new dose should be administered as soon as possible using a single prefilled syringe for manual use.  What may interact with this medicine?  Interactions have not been studied.  Give your health care provider a list of all the medicines, herbs, non-prescription drugs, or dietary supplements you use. Also tell them if you smoke, drink alcohol, or use illegal drugs. Some items may interact with your medicine.  This list may not describe all possible interactions. Give your health care provider a list of all the medicines, herbs, non-prescription drugs, or dietary supplements you use. Also tell them if you smoke, drink alcohol, or use illegal drugs. Some items may interact with your medicine.  What should I watch for while using this medicine?  You may need blood work done while you are taking this medicine.  If you are going to need a MRI, CT scan, or other procedure, tell your doctor that you are using this medicine (On-Body Injector only).  What side effects may I notice from receiving this medicine?  Side effects that you should report to   your doctor or health care professional as soon as possible:  -allergic reactions like skin rash, itching or hives, swelling of the face, lips, or tongue  -back pain  -dizziness  -fever  -pain, redness, or irritation at site where injected  -pinpoint red spots on the skin  -red or dark-brown urine  -shortness of breath or breathing problems  -stomach or side pain, or pain at the shoulder  -swelling  -tiredness  -trouble passing urine or  change in the amount of urine  Side effects that usually do not require medical attention (report to your doctor or health care professional if they continue or are bothersome):  -bone pain  -muscle pain  This list may not describe all possible side effects. Call your doctor for medical advice about side effects. You may report side effects to FDA at 1-800-FDA-1088.  Where should I keep my medicine?  Keep out of the reach of children.  If you are using this medicine at home, you will be instructed on how to store it. Throw away any unused medicine after the expiration date on the label.  NOTE: This sheet is a summary. It may not cover all possible information. If you have questions about this medicine, talk to your doctor, pharmacist, or health care provider.   2019 Elsevier/Gold Standard (2017-04-09 16:57:08)

## 2018-01-25 NOTE — Progress Notes (Signed)
[facility] Progress Note   Jane Brooks [id number] Aug 13, 1970 48 y.o.  Jane Brooks is managed by Dr. Burr Medico  Actively treated with chemotherapy/immunotherapy/hormonal therapy: yes  Current Therapy: FOLFIRINOX  Last Treated: 01/23/2018 (cycle 5, day 1)  Assessment: Plan:    Malignant neoplasm of head of pancreas ([place])   Malignant neoplasm of the head of the pancreas: Jane Brooks was seen in the infusion room today as she is to receive cycle 5, day 1 of FOLFIRINOX.  She is to return to be seen on 02/06/2018 and will have her next cycle of chemotherapy at that time.  Please see After Visit Summary for patient specific instructions.  Future Appointments  Date Time Provider [place]  01/25/2018  2:15 PM [facility] [place] FLUSH [facility] None  01/28/2018  8:30 AM [facility] 2 [facility] [place]  02/06/2018  8:30 AM [facility] ONLY [facility] None  02/06/2018  8:45 AM [facility] [place] FLUSH [facility] None  02/06/2018  9:15 AM Truitt Merle, MD [facility] None  02/06/2018 10:00 AM [facility] INFUSION [facility] None  02/08/2018  2:15 PM [facility] [place] FLUSH [facility] None  02/20/2018  9:00 AM [facility] LAB 5 [facility] None  02/20/2018  9:15 AM [facility] [place] FLUSH [facility] None  02/20/2018  9:45 AM Truitt Merle, MD [facility] None  02/20/2018 10:30 AM [facility] INFUSION [facility] None  02/22/2018  2:15 PM [facility] [place] FLUSH [facility] None    No orders of the defined types were placed in this encounter.      Subjective:   Patient ID:  Jane Brooks is a 48 y.o. (DOB 11/14/1970) female.  Chief Complaint: No chief complaint on file.   HPI Jane Brooks   is a 48 year old female with a diagnosis of a malignant neoplasm of the head of the pancreas.  She was seen in the infusion room today as she is receiving her fifth cycle of FOLFIRINOX.  She reports that she is doing well with no acute issues of concern.  She denies fevers, chills, sweats, headaches, acute pain,  nausea, vomiting, constipation, diarrhea, or anorexia.  Medications: I have reviewed the patient's current medications.  Allergies:  Allergies  Allergen Reactions  . Latex Hives    Past Medical History:  Diagnosis Date  . Family history of lung cancer   . Family history of prostate cancer   . Family history of uterine cancer   . Gallstones 10/2017  . Pancreatic cancer ([place])   . Pneumothorax, closed, traumatic    years ago    Past Surgical History:  Procedure Laterality Date  . BILIARY STENT PLACEMENT  10/31/2017   Procedure: BILIARY STENT PLACEMENT;  Surgeon: [name], MD;  [facility] ENDOSCOPY;  Service: Gastroenterology;;  . ERCP N/A 10/31/2017   Procedure: ENDOSCOPIC RETROGRADE CHOLANGIOPANCREATOGRAPHY (ERCP);  Surgeon: [name], MD;  Location: [facility] ENDOSCOPY;  Service: Gastroenterology;  Laterality: N/A;  . ESOPHAGOGASTRODUODENOSCOPY N/A 11/08/2017   Procedure: ESOPHAGOGASTRODUODENOSCOPY (EGD);  Surgeon: Milus Banister, MD;  Location: [facility] ENDOSCOPY;  Service: Endoscopy;  Laterality: N/A;  . EUS N/A 11/08/2017   Procedure: UPPER ENDOSCOPIC ULTRASOUND (EUS) RADIAL;  Surgeon: Milus Banister, MD;  [facility] ENDOS[facility];  Service: Endoscopy;  Laterality: N/A;  . FINE NEEDLE ASPIRATION  11/08/2017   Procedure: FINE NEEDLE ASPIRATION;  Surgeon: Milus Banister, MD;  [facility] ENDOSCOPY;  Service: Endoscopy;;  . IR IMAGING GUIDED PORT INSERTION  11/15/2017  . SPHINCTEROTOMY  10/31/2017   Procedure: SPHINCTEROTOMY;  Surgeon: [name], MD;  Location: [facility] ENDOSCOPY;  Service: Gastroenterology;;  .  TONSILLECTOMY      Family History  Problem Relation Age of Onset  . Diabetes Mother   . Hypertension Mother   . COPD Mother   . Uterine cancer Mother 91       had hysterectomy  . Diabetes Sister   . Hypertension Sister   . Lung cancer Maternal Grandmother        lung cancer  . Hypertension Sister     Social History   Socioeconomic History  . Marital  status: Single    Spouse name: Not on file  . Number of children: Not on file  . Years of education: Not on file  . Highest education level: Not on file  Occupational History  . Occupation: unemployed  Social Needs  . Financial resource strain: Not on file  . Food insecurity:    Worry: Not on file    Inability: Not on file  . Transportation needs:    Medical: Not on file    Non-medical: Not on file  Tobacco Use  . Smoking status: Current Every Day Smoker    Packs/day: 1.00    Years: 37.00    Pack years: 37.00    Types: Cigarettes  . Smokeless tobacco: Never Used  Substance and Sexual Activity  . Alcohol use: Not Currently  . Drug use: Never  . Sexual activity: Not Currently  Lifestyle  . Physical activity:    Days per week: Not on file    Minutes per session: Not on file  . Stress: Not on file  Relationships  . Social connections:    Talks on phone: Not on file    Gets together: Not on file    Attends religious service: Not on file    Active member of club or organization: Not on file    Attends meetings of clubs or organizations: Not on file    Relationship status: Not on file  . Intimate partner violence:    Fear of current or ex partner: Not on file    Emotionally abused: Not on file    Physically abused: Not on file    Forced sexual activity: Not on file  Other Topics Concern  . Not on file  Social History Narrative  . Not on file    Past Medical History, Surgical history, Social history, and Family history were reviewed and updated as appropriate.   Please see review of systems for further details on the patient's review from today.   Review of Systems:  Review of Systems  Constitutional: Negative for activity change, appetite change, chills, diaphoresis, fatigue and fever.  HENT: Negative for trouble swallowing and voice change.   Respiratory: Negative for cough, chest tightness, shortness of breath and wheezing.   Cardiovascular: Negative for chest  pain and palpitations.  Gastrointestinal: Negative for abdominal pain, constipation, diarrhea, nausea and vomiting.  Musculoskeletal: Negative for back pain and myalgias.  Neurological: Negative for dizziness, light-headedness and headaches.    Objective:   Physical Exam:  There were no vitals taken for this visit. ECOG: 0  Physical Exam Constitutional:      General: She is not in acute distress.    Appearance: She is not diaphoretic.  HENT:     Head: Normocephalic and atraumatic.  Cardiovascular:     Rate and Rhythm: Normal rate and regular rhythm.     Heart sounds: Normal heart sounds. No murmur. No friction rub. No gallop.   Pulmonary:     Effort: Pulmonary effort is normal. No  respiratory distress.     Breath sounds: Normal breath sounds. No wheezing or rales.  Abdominal:     General: Bowel sounds are normal. There is no distension.     Tenderness: There is no abdominal tenderness. There is no guarding.  Skin:    General: Skin is warm and dry.     Findings: No erythema or rash.  Neurological:     Mental Status: She is alert.     Lab Review:     Component Value Date/Time   NA 139 01/23/2018 0922   K 3.8 01/23/2018 0922   CL 105 01/23/2018 0922   CO2 24 01/23/2018 0922   GLUCOSE 108 (H) 01/23/2018 0922   BUN 6 01/23/2018 0922   CREATININE 0.59 01/23/2018 0922   CALCIUM 8.8 (L) 01/23/2018 0922   PROT 6.8 01/23/2018 0922   ALBUMIN 3.4 (L) 01/23/2018 0922   AST 65 (H) 01/23/2018 0922   ALT 72 (H) 01/23/2018 0922   ALKPHOS 292 (H) 01/23/2018 0922   BILITOT 0.3 01/23/2018 0922   GFRNONAA >60 01/23/2018 0922   GFRAA >60 01/23/2018 0922       Component Value Date/Time   WBC 5.5 01/23/2018 0922   WBC 29.0 (H) 12/12/2017 0740   RBC 5.00 01/23/2018 0922   HGB 10.1 (L) 01/23/2018 0922   HCT 32.2 (L) 01/23/2018 0922   PLT 269 01/23/2018 0922   MCV 64.4 (L) 01/23/2018 0922   MCH 20.2 (L) 01/23/2018 0922   MCHC 31.4 01/23/2018 0922   RDW 18.1 (H) 01/23/2018 0922    LYMPHSABS 2.1 01/23/2018 0922   MONOABS 0.7 01/23/2018 0922   EOSABS 0.2 01/23/2018 0922   BASOSABS 0.0 01/23/2018 0922   -------------------------------  Imaging from last 24 hours (if applicable):  Radiology interpretation: No results found.      This case was discussed with Dr. Burr Medico. She expressed agreement with my management of this patient.

## 2018-01-25 NOTE — Telephone Encounter (Signed)
Patient received Chemo on Wednesday, doing well today, going back to clinic today. Constipation resolved from taking Miralax and Senocot. Had emergency food stamps, wanted to apply for regular food stamps but that was dependent on having apartment lease signed. Deadline was yesterday, disappointed this was not able to be completed. Will reapply once lease is signed. Otherwise doing well with no needs at this time.

## 2018-01-28 ENCOUNTER — Ambulatory Visit (HOSPITAL_COMMUNITY): Payer: Medicaid Other

## 2018-01-30 ENCOUNTER — Ambulatory Visit: Payer: Medicaid Other

## 2018-01-30 ENCOUNTER — Other Ambulatory Visit: Payer: Medicaid Other

## 2018-01-30 ENCOUNTER — Ambulatory Visit: Payer: Medicaid Other | Admitting: Hematology

## 2018-01-30 ENCOUNTER — Other Ambulatory Visit: Payer: Self-pay

## 2018-01-30 DIAGNOSIS — C250 Malignant neoplasm of head of pancreas: Secondary | ICD-10-CM

## 2018-01-30 NOTE — Progress Notes (Signed)
T

## 2018-02-02 ENCOUNTER — Other Ambulatory Visit: Payer: Self-pay

## 2018-02-02 ENCOUNTER — Emergency Department (HOSPITAL_COMMUNITY)
Admission: EM | Admit: 2018-02-02 | Discharge: 2018-02-02 | Disposition: A | Discharge status: Home / Self Care | Payer: Medicaid Other | Attending: Emergency Medicine | Admitting: Emergency Medicine

## 2018-02-02 ENCOUNTER — Encounter (HOSPITAL_COMMUNITY): Payer: Self-pay | Admitting: *Deleted

## 2018-02-02 DIAGNOSIS — W228XXA Striking against or struck by other objects, initial encounter: Secondary | ICD-10-CM | POA: Insufficient documentation

## 2018-02-02 DIAGNOSIS — S301XXA Contusion of abdominal wall, initial encounter: Secondary | ICD-10-CM | POA: Diagnosis not present

## 2018-02-02 DIAGNOSIS — Y998 Other external cause status: Secondary | ICD-10-CM | POA: Diagnosis not present

## 2018-02-02 DIAGNOSIS — Z8507 Personal history of malignant neoplasm of pancreas: Secondary | ICD-10-CM | POA: Diagnosis not present

## 2018-02-02 DIAGNOSIS — Z91040 Latex allergy status: Secondary | ICD-10-CM | POA: Insufficient documentation

## 2018-02-02 DIAGNOSIS — Y929 Unspecified place or not applicable: Secondary | ICD-10-CM | POA: Insufficient documentation

## 2018-02-02 DIAGNOSIS — S3991XA Unspecified injury of abdomen, initial encounter: Secondary | ICD-10-CM | POA: Diagnosis present

## 2018-02-02 DIAGNOSIS — Y9389 Activity, other specified: Secondary | ICD-10-CM | POA: Insufficient documentation

## 2018-02-02 DIAGNOSIS — F17210 Nicotine dependence, cigarettes, uncomplicated: Secondary | ICD-10-CM | POA: Diagnosis not present

## 2018-02-02 MED ORDER — ONDANSETRON 8 MG PO TBDP
8.0000 mg | ORAL_TABLET | Freq: Once | ORAL | Status: AC
  Administered 2018-02-02: 8 mg via ORAL
  Filled 2018-02-02: qty 1

## 2018-02-02 MED ORDER — HYDROMORPHONE HCL 4 MG PO TABS: 4.0000 mg | ORAL_TABLET | Freq: Four times a day (QID) | ORAL | 0 refills | Status: DC | PRN

## 2018-02-02 MED ORDER — SODIUM CHLORIDE 0.9% FLUSH: 3.0000 mL | Freq: Once | INTRAVENOUS | Status: DC

## 2018-02-02 MED ORDER — HYDROMORPHONE HCL 2 MG/ML IJ SOLN
2.0000 mg | Freq: Once | INTRAMUSCULAR | Status: AC
  Administered 2018-02-02: 2 mg via INTRAMUSCULAR
  Filled 2018-02-02: qty 1

## 2018-02-02 NOTE — ED Provider Notes (Signed)
[facility] DEPT Provider Note   CSN: [id number] Arrival date & time: 02/02/18  5625     History   Chief Complaint Chief Complaint  Patient presents with  . Abdominal Pain    HPI Jane Brooks is a 48 y.o. female.  HPI   She complains of right upper quadrant abdominal pain starting 3 days ago when she was helping to carry a table which accidentally bumped into her upper abdomen.  She is taking her usual pain medicine, since then without relief.  She is currently undergoing treatment for pancreatic cancer with biweekly infusions.  She states that she has "chronic pain."  She has decreased appetite since the incident, but has ongoing anorexia.  She has nausea currently, without vomiting.  She denies fever, chills, altered bowel habits, weakness or dizziness.  There are no other known modifying factors.  Past Medical History:  Diagnosis Date  . Family history of lung cancer   . Family history of prostate cancer   . Family history of uterine cancer   . Gallstones 10/2017  . Pancreatic cancer ([place])   . Pneumothorax, closed, traumatic    years ago    Patient Active Problem List   Diagnosis Date Noted  . Genetic testing 01/24/2018  . Family history of uterine cancer   . Family history of prostate cancer   . Family history of lung cancer   . Pancreatic cancer ([place]) 11/09/2017  . Pancreatic mass   . Cholecystitis 10/30/2017  . Jaundice 10/30/2017    Past Surgical History:  Procedure Laterality Date  . BILIARY STENT PLACEMENT  10/31/2017   Procedure: BILIARY STENT PLACEMENT;  Surgeon: [name], MD;  Location: [facility] ENDOSCOPY;  Service: Gastroenterology;;  . ERCP N/A 10/31/2017   Procedure: ENDOSCOPIC RETROGRADE CHOLANGIOPANCREATOGRAPHY (ERCP);  Surgeon: [name], MD;  Location: [facility] ENDOSCOPY;  Service: Gastroenterology;  Laterality: N/A;  . ESOPHAGOGASTRODUODENOSCOPY N/A 11/08/2017   Procedure: ESOPHAGOGASTRODUODENOSCOPY (EGD);   Surgeon: Milus Banister, MD;  Location: [facility] ENDOSCOPY;  Service: Endoscopy;  Laterality: N/A;  . EUS N/A 11/08/2017   Procedure: UPPER ENDOSCOPIC ULTRASOUND (EUS) RADIAL;  Surgeon: Milus Banister, MD;  [facility] ENDOS[facility];  Service: Endoscopy;  Laterality: N/A;  . FINE NEEDLE ASPIRATION  11/08/2017   Procedure: FINE NEEDLE ASPIRATION;  Surgeon: Milus Banister, MD;  [facility] ENDOSCOPY;  Service: Endoscopy;;  . IR IMAGING GUIDED PORT INSERTION  11/15/2017  . SPHINCTEROTOMY  10/31/2017   Procedure: SPHINCTEROTOMY;  Surgeon: [name], MD;  Location: [facility] ENDOSCOPY;  Service: Gastroenterology;;  . TONSILLECTOMY       OB History   No obstetric history on file.      Home Medications    Prior to Admission medications   Medication Sig Start Date End Date Taking? Authorizing Provider  loperamide (IMODIUM) 2 MG capsule Take 2 capsules (4 mg total) by mouth every 6 (six) hours as needed for diarrhea or loose stools. 11/21/17  Yes Truitt Merle, MD  loratadine (CLARITIN) 10 MG tablet Take 1 tablet (10 mg total) by mouth daily. Take for 5 days after the Udenyca injection Patient taking differently: Take 10 mg by mouth See admin instructions. Take 10 mg by mouth daily for 5 days after the Udenyca injection 11/19/17  Yes Truitt Merle, MD  morphine (MS CONTIN) 15 MG 12 hr tablet Take 1 tablet (15 mg total) by mouth every 12 (twelve) hours. 01/14/18  Yes Owens Shark, NP  ondansetron (ZOFRAN) 8 MG tablet Take 1 tablet (8 mg  total) by mouth every 8 (eight) hours as needed for nausea or vomiting. 01/02/18  Yes Truitt Merle, MD  prochlorperazine (COMPAZINE) 10 MG tablet Take 1 tablet (10 mg total) by mouth every 6 (six) hours as needed for nausea or vomiting. 11/09/17  Yes Truitt Merle, MD  promethazine (PHENERGAN) 25 MG tablet Take 1 tablet (25 mg total) by mouth every 6 (six) hours as needed for nausea or vomiting. 11/08/17  Yes Isla Pence, MD  HYDROmorphone (DILAUDID) 4 MG tablet Take 1 tablet (4 mg  total) by mouth every 6 (six) hours as needed for severe pain. 02/02/18   Daleen Bo, MD  lidocaine-prilocaine (EMLA) cream Apply 1 application topically as needed. Patient not taking: Reported on 01/14/2018 11/09/17   Truitt Merle, MD    Family History Family History  Problem Relation Age of Onset  . Diabetes Mother   . Hypertension Mother   . COPD Mother   . Uterine cancer Mother 17       had hysterectomy  . Diabetes Sister   . Hypertension Sister   . Lung cancer Maternal Grandmother        lung cancer  . Hypertension Sister     Social History Social History   Tobacco Use  . Smoking status: Current Every Day Smoker    Packs/day: 1.00    Years: 37.00    Pack years: 37.00    Types: Cigarettes  . Smokeless tobacco: Never Used  Substance Use Topics  . Alcohol use: Not Currently  . Drug use: Never     Allergies   Latex   Review of Systems Review of Systems  All other systems reviewed and are negative.    Physical Exam Updated Vital Signs BP (!) 147/88 (BP Location: Left Arm)   Pulse 87   Temp 98.3 F (36.8 C) (Oral)   Resp 15   Ht 4' 11"  (1.499 m)   Wt 56.7 kg   LMP 11/01/2017 (Approximate)   SpO2 99%   BMI 25.25 kg/m   Physical Exam Vitals signs and nursing note reviewed.  Constitutional:      General: She is not in acute distress.    Appearance: She is ill-appearing. She is not toxic-appearing or diaphoretic.  HENT:     Head: Normocephalic and atraumatic.     Right Ear: External ear normal.     Left Ear: External ear normal.  Eyes:     Conjunctiva/sclera: Conjunctivae normal.     Pupils: Pupils are equal, round, and reactive to light.  Neck:     Musculoskeletal: Normal range of motion and neck supple.     Trachea: Phonation normal.  Cardiovascular:     Rate and Rhythm: Normal rate and regular rhythm.     Heart sounds: Normal heart sounds.  Pulmonary:     Effort: Pulmonary effort is normal. No respiratory distress.     Breath sounds:  Normal breath sounds. No stridor.  Abdominal:     General: There is no distension.     Palpations: Abdomen is soft. There is no mass.     Tenderness: There is abdominal tenderness (Epigastric and right upper quadrant, mild). There is no guarding or rebound.     Hernia: No hernia is present.  Musculoskeletal: Normal range of motion.  Skin:    General: Skin is warm and dry.  Neurological:     Mental Status: She is alert and oriented to person, place, and time.     Cranial Nerves: No cranial nerve deficit.  Sensory: No sensory deficit.     Motor: No abnormal muscle tone.     Coordination: Coordination normal.  Psychiatric:        Behavior: Behavior normal.        Thought Content: Thought content normal.        Judgment: Judgment normal.      ED Treatments / Results  Labs (all labs ordered are listed, but only abnormal results are displayed) Labs Reviewed - No data to display  EKG None  Radiology No results found.  Procedures Procedures (including critical care time)  Medications Ordered in ED Medications  HYDROmorphone (DILAUDID) injection 2 mg (2 mg Intramuscular Given 02/02/18 0841)  ondansetron (ZOFRAN-ODT) disintegrating tablet 8 mg (8 mg Oral Given 02/02/18 0841)     Initial Impression / Assessment and Plan / ED Course  I have reviewed the triage vital signs and the nursing notes.  Pertinent labs & imaging results that were available during my care of the patient were reviewed by me and considered in my medical decision making (see chart for details).      Patient Vitals for the past 24 hrs:  BP Temp Temp src Pulse Resp SpO2 Height Weight  02/02/18 1000 (!) 147/88 - - 87 15 99 % - -  02/02/18 0900 115/77 - - 67 16 96 % - -  02/02/18 0830 114/76 - - 62 - 100 % - -  02/02/18 0814 122/84 - - 65 19 100 % - -  02/02/18 0627 121/77 98.3 F (36.8 C) Oral 80 16 99 % 4' 11"  (1.499 m) 56.7 kg    10:16 AM Reevaluation with update and discussion. After initial  assessment and treatment, an updated evaluation reveals she is comfortable, eating without problems and ambulating.  Findings discussed and questions answered. Daleen Bo   Medical Decision Making: Nonspecific abdominal pain with known pancreatic cancer.  Pain exacerbation with minor traumatic injury.  Injury subacute.  Doubt internal bleeding, significant progression of pancreatic cancer or occult infection.  No indication for further ED evaluation, treatment or hospitalization.  CRITICAL CARE-no Performed by: Daleen Bo  Nursing Notes Reviewed/ Care Coordinated Applicable Imaging Reviewed Interpretation of Laboratory Data incorporated into ED treatment  The patient appears reasonably screened and/or stabilized for discharge and I doubt any other medical condition or other [facility] requiring further screening, evaluation, or treatment in the ED at this time prior to discharge.  Plan: Home Medications-usual except hold MSIR while using hydromorphone; Home Treatments-rest, fluids; return here if the recommended treatment, does not improve the symptoms; Recommended follow up-oncology as scheduled   Final Clinical Impressions(s) / ED Diagnoses   Final diagnoses:  Contusion of abdominal wall, initial encounter    ED Discharge Orders         Ordered    HYDROmorphone (DILAUDID) 4 MG tablet  Every 6 hours PRN,   Status:  Discontinued     02/02/18 1012    HYDROmorphone (DILAUDID) 4 MG tablet  Every 6 hours PRN     02/02/18 1014           Daleen Bo, MD 02/02/18 1018

## 2018-02-02 NOTE — ED Triage Notes (Signed)
Pt stated "I have pancreatic cancer and my stomach has been hurting x 2 days.  I'm not supposed to be moving furniture but I just moved and hit my stomach on a table.  I have chemo this Wed.  I have a CT scan scheduled Monday @ 1430."

## 2018-02-02 NOTE — Discharge Instructions (Signed)
We sent a prescription for hydromorphone to your pharmacy, to take instead of the MSIR, for a few days.  Follow-up with your doctor as planned for scheduled imaging and treatment.

## 2018-02-02 NOTE — ED Notes (Signed)
ED Provider at bedside. 

## 2018-02-04 ENCOUNTER — Ambulatory Visit (HOSPITAL_COMMUNITY)
Admission: RE | Admit: 2018-02-04 | Discharge: 2018-02-04 | Disposition: A | Discharge status: Home / Self Care | Payer: Medicaid Other | Source: Ambulatory Visit | Attending: Hematology | Admitting: Hematology

## 2018-02-04 ENCOUNTER — Encounter (HOSPITAL_COMMUNITY): Payer: Self-pay | Admitting: *Deleted

## 2018-02-04 DIAGNOSIS — C250 Malignant neoplasm of head of pancreas: Secondary | ICD-10-CM | POA: Insufficient documentation

## 2018-02-04 MED ORDER — HEPARIN SOD (PORK) LOCK FLUSH 100 UNIT/ML IV SOLN
500.0000 [IU] | Freq: Once | INTRAVENOUS | Status: AC
  Administered 2018-02-04: 500 [IU]

## 2018-02-04 MED ORDER — IOHEXOL 300 MG/ML  SOLN
100.0000 mL | Freq: Once | INTRAMUSCULAR | Status: AC | PRN
  Administered 2018-02-04: 100 mL via INTRAVENOUS

## 2018-02-04 MED ORDER — SODIUM CHLORIDE (PF) 0.9 % IJ SOLN
INTRAMUSCULAR | Status: AC
  Filled 2018-02-04: qty 50

## 2018-02-04 MED ORDER — HEPARIN SOD (PORK) LOCK FLUSH 100 UNIT/ML IV SOLN
INTRAVENOUS | Status: AC
  Administered 2018-02-04: 500 [IU]
  Filled 2018-02-04: qty 5

## 2018-02-04 MED ORDER — HEPARIN SOD (PORK) LOCK FLUSH 100 UNIT/ML IV SOLN
INTRAVENOUS | Status: AC
  Filled 2018-02-04: qty 5

## 2018-02-05 ENCOUNTER — Encounter: Payer: Self-pay | Admitting: Medical

## 2018-02-05 ENCOUNTER — Inpatient Hospital Stay (HOSPITAL_BASED_OUTPATIENT_CLINIC_OR_DEPARTMENT_OTHER): Payer: Medicaid Other | Admitting: Medical

## 2018-02-05 ENCOUNTER — Telehealth: Payer: Self-pay

## 2018-02-05 ENCOUNTER — Inpatient Hospital Stay: Payer: Medicaid Other

## 2018-02-05 VITALS — BP 117/82 | HR 80 | Temp 97.9°F | Resp 18 | Ht 59.0 in | Wt 123.1 lb

## 2018-02-05 DIAGNOSIS — R1013 Epigastric pain: Secondary | ICD-10-CM

## 2018-02-05 DIAGNOSIS — Z5111 Encounter for antineoplastic chemotherapy: Secondary | ICD-10-CM | POA: Diagnosis not present

## 2018-02-05 DIAGNOSIS — C250 Malignant neoplasm of head of pancreas: Secondary | ICD-10-CM

## 2018-02-05 LAB — CMP (CANCER CENTER ONLY)
ALK PHOS: 219 U/L — AB (ref 38–126)
ALT: 34 U/L (ref 0–44)
AST: 24 U/L (ref 15–41)
Albumin: 3.9 g/dL (ref 3.5–5.0)
Anion gap: 10 (ref 5–15)
BUN: <4 mg/dL — ABNORMAL LOW (ref 6–20)
CALCIUM: 9.2 mg/dL (ref 8.9–10.3)
CO2: 25 mmol/L (ref 22–32)
Chloride: 104 mmol/L (ref 98–111)
Creatinine: 0.62 mg/dL (ref 0.44–1.00)
GFR, Est AFR Am: >60 mL/min (ref >60)
GFR, Estimated: >60 mL/min (ref >60)
Glucose, Bld: 89 mg/dL (ref 70–99)
Potassium: 4.1 mmol/L (ref 3.5–5.1)
Sodium: 139 mmol/L (ref 135–145)
Total Bilirubin: 0.3 mg/dL (ref 0.3–1.2)
Total Protein: 7.5 g/dL (ref 6.5–8.1)

## 2018-02-05 LAB — CBC WITH DIFFERENTIAL (CANCER CENTER ONLY)
Abs Immature Granulocytes: 0.11 10*3/uL — ABNORMAL HIGH (ref 0.00–0.07)
Basophils Absolute: 0 10*3/uL (ref 0.0–0.1)
Basophils Relative: 0 %
Eosinophils Absolute: 0.1 10*3/uL (ref 0.0–0.5)
Eosinophils Relative: 1 %
HCT: 37.7 % (ref 36.0–46.0)
HEMOGLOBIN: 11.7 g/dL — AB (ref 12.0–15.0)
Immature Granulocytes: 1 %
LYMPHS PCT: 22 %
Lymphs Abs: 2.5 10*3/uL (ref 0.7–4.0)
MCH: 20 pg — ABNORMAL LOW (ref 26.0–34.0)
MCHC: 31 g/dL (ref 30.0–36.0)
MCV: 64.6 fL — ABNORMAL LOW (ref 80.0–100.0)
MONO ABS: 0.7 10*3/uL (ref 0.1–1.0)
Monocytes Relative: 7 %
Neutro Abs: 7.8 10*3/uL — ABNORMAL HIGH (ref 1.7–7.7)
Neutrophils Relative %: 69 %
Platelet Count: 315 10*3/uL (ref 150–400)
RBC: 5.84 MIL/uL — ABNORMAL HIGH (ref 3.87–5.11)
RDW: 19.7 % — ABNORMAL HIGH (ref 11.5–15.5)
WBC Count: 11.3 10*3/uL — ABNORMAL HIGH (ref 4.0–10.5)
nRBC: 0 % (ref 0.0–0.2)

## 2018-02-05 MED ORDER — LORATADINE 10 MG PO TABS: 10.0000 mg | ORAL_TABLET | Freq: Every day | ORAL | 2 refills | Status: DC

## 2018-02-05 MED ORDER — DULOXETINE HCL 20 MG PO CPEP: 20.0000 mg | ORAL_CAPSULE | Freq: Two times a day (BID) | ORAL | 3 refills | Status: DC

## 2018-02-05 MED ORDER — CYCLOBENZAPRINE HCL 5 MG PO TABS: 5.0000 mg | ORAL_TABLET | Freq: Three times a day (TID) | ORAL | 0 refills | Status: DC | PRN

## 2018-02-05 MED ORDER — MORPHINE SULFATE 15 MG PO TABS: 15.0000 mg | ORAL_TABLET | Freq: Four times a day (QID) | ORAL | 0 refills | Status: DC | PRN

## 2018-02-05 MED FILL — CYCLOBENZAPRINE HCL 5 MG TA: 5 | 10 days supply | Qty: 30 | Fill #0

## 2018-02-05 MED FILL — DULOXETINE HCL 20 MG CPEP: 20 | 60 days supply | Qty: 60 | Fill #0

## 2018-02-05 MED FILL — MORPHINE SULFATE IR 15 MG T: 15 | 15 days supply | Qty: 60 | Fill #0

## 2018-02-05 MED FILL — LORATADINE 10 MG TABS: 10 | 30 days supply | Qty: 30 | Fill #0

## 2018-02-05 NOTE — Telephone Encounter (Signed)
Patient calls stating she is having increased pain would like to be seen, scheduled her for 10:00 for labs and 10:30 with [facility], she verbalized an understanding. High priority scheduling message was sent.

## 2018-02-06 ENCOUNTER — Inpatient Hospital Stay: Payer: Medicaid Other

## 2018-02-06 ENCOUNTER — Inpatient Hospital Stay: Payer: Medicaid Other | Admitting: Hematology

## 2018-02-06 VITALS — BP 124/82 | HR 87 | Temp 98.3°F | Resp 16

## 2018-02-06 DIAGNOSIS — Z5111 Encounter for antineoplastic chemotherapy: Secondary | ICD-10-CM | POA: Diagnosis not present

## 2018-02-06 DIAGNOSIS — C250 Malignant neoplasm of head of pancreas: Secondary | ICD-10-CM

## 2018-02-06 MED ORDER — HEPARIN SOD (PORK) LOCK FLUSH 100 UNIT/ML IV SOLN
500.0000 [IU] | Freq: Once | INTRAVENOUS | Status: DC | PRN
  Filled 2018-02-06: qty 5

## 2018-02-06 MED ORDER — DEXAMETHASONE SODIUM PHOSPHATE 10 MG/ML IJ SOLN
INTRAMUSCULAR | Status: AC
  Filled 2018-02-06: qty 1

## 2018-02-06 MED ORDER — IRINOTECAN HCL CHEMO INJECTION 100 MG/5ML
120.0000 mg/m2 | Freq: Once | INTRAVENOUS | Status: AC
  Administered 2018-02-06: 180 mg via INTRAVENOUS
  Filled 2018-02-06: qty 9

## 2018-02-06 MED ORDER — SODIUM CHLORIDE 0.9% FLUSH
10.0000 mL | INTRAVENOUS | Status: DC | PRN
  Filled 2018-02-06: qty 10

## 2018-02-06 MED ORDER — DEXAMETHASONE SODIUM PHOSPHATE 10 MG/ML IJ SOLN
10.0000 mg | Freq: Once | INTRAMUSCULAR | Status: AC
  Administered 2018-02-06: 10 mg via INTRAVENOUS

## 2018-02-06 MED ORDER — PALONOSETRON HCL INJECTION 0.25 MG/5ML
INTRAVENOUS | Status: AC
  Filled 2018-02-06: qty 5

## 2018-02-06 MED ORDER — ATROPINE SULFATE 1 MG/ML IJ SOLN
INTRAMUSCULAR | Status: AC
  Filled 2018-02-06: qty 1

## 2018-02-06 MED ORDER — SODIUM CHLORIDE 0.9 % IV SOLN
2000.0000 mg/m2 | INTRAVENOUS | Status: DC
  Administered 2018-02-06: 3100 mg via INTRAVENOUS
  Filled 2018-02-06: qty 62

## 2018-02-06 MED ORDER — PALONOSETRON HCL INJECTION 0.25 MG/5ML
0.2500 mg | Freq: Once | INTRAVENOUS | Status: AC
  Administered 2018-02-06: 0.25 mg via INTRAVENOUS

## 2018-02-06 MED ORDER — DEXTROSE 5 % IV SOLN
Freq: Once | INTRAVENOUS | Status: AC
  Administered 2018-02-06: 11:00:00 via INTRAVENOUS
  Filled 2018-02-06: qty 250

## 2018-02-06 MED ORDER — LEUCOVORIN CALCIUM INJECTION 350 MG
320.0000 mg/m2 | Freq: Once | INTRAVENOUS | Status: AC
  Administered 2018-02-06: 500 mg via INTRAVENOUS
  Filled 2018-02-06: qty 25

## 2018-02-06 MED ORDER — OXALIPLATIN CHEMO INJECTION 100 MG/20ML
65.0000 mg/m2 | Freq: Once | INTRAVENOUS | Status: AC
  Administered 2018-02-06: 100 mg via INTRAVENOUS
  Filled 2018-02-06: qty 20

## 2018-02-06 MED ORDER — ATROPINE SULFATE 1 MG/ML IJ SOLN
0.5000 mg | Freq: Once | INTRAMUSCULAR | Status: AC | PRN
  Administered 2018-02-06: 0.5 mg via INTRAVENOUS

## 2018-02-06 MED ORDER — DEXTROSE 5 % IV SOLN
Freq: Once | INTRAVENOUS | Status: AC
  Administered 2018-02-06: 14:00:00 via INTRAVENOUS
  Filled 2018-02-06: qty 250

## 2018-02-06 NOTE — Progress Notes (Signed)
[facility] Progress Note   Jane Brooks [id number] 12/28/1970 48 y.o.  Jane Brooks is managed by Dr. Truitt Merle  Actively treated with chemotherapy/immunotherapy/hormonal therapy: yes  Current Therapy: Forfirinox  Last Treated: 01/23/2018 (Cycle 5 Day 1)  Assessment: Plan:    Malignant neoplasm of head of pancreas (HCC)  Epigastric pain - Plan: morphine (MSIR) 15 MG tablet, DULoxetine (CYMBALTA) 20 MG capsule, cyclobenzaprine (FLEXERIL) 5 MG tablet, Ambulatory referral to Pain Clinic\  1) Malignant neoplasm of the head of the pancreas: The patient is s/p Cycle 5 Day 1 of Folfirinox which was dosed on 01/23/2018.  She will return tomorrow for chemotherapy only and will be seen in follow up by Dr. Burr Medico on 02/20/2018.  Her case will be presents at Tumor Board next week.  2) Abdominal pain: The patient has been referred to Dr. Clydell Hakim for assistance in pain management.  She will continue MS Contin 15 mg every 12 hours.  She was given a refill of Morphine IR 15 mg every 6 hours PRN.  She was given prescriptions for Cymbalta 20 mg once daily for 7 days with instructions to increase to twice daily dosing after 7 days and Flexeril 5 mg three times daily.   Please see After Visit Summary for patient specific instructions.  Future Appointments  Date Time Provider [place]  02/08/2018  2:15 PM [facility] [place] [facility] None  02/20/2018  9:00 AM [facility] LAB 5 [facility] None  02/20/2018  9:15 AM [facility] [place] [facility] None  02/20/2018  9:45 AM Truitt Merle, MD [facility] None  02/20/2018 10:30 AM [facility] INFUSION [facility] None  02/22/2018  2:15 PM [facility] [place] FL[facility] None    Orders Placed This Encounter  Procedures  . Ambulatory referral to Pain Clinic       Subjective:   Patient ID:  Jane Brooks is a 48 y.o. (DOB 08-Mar-1970) female.  Chief Complaint: No chief complaint on file.   HPI Jane Brooks is a 48 y.o. female  with a malignant neoplasm of the head of the pancreas.  She is managed by Dr. Burr Medico and is s/p Cycle 5 of Folfirinox which was begun on 01/23/2018.  She is scheduled to receive Cycle 6 beginning tomorrow.  She presents today with upper abdominal pain which is not well controlled with MS Contin 15 mg PO twice daily and Morphine IR 7.5 mg every 6 hours as needed.  She reports moving into a new apartment last Thursday.  She was moving a table when she hit her abdomen which caused a flare in her pain.  She was seen in the ER on 02/02/2018 and was given IV Dilaudid and sent home with oral Dilaudid 4 mg tablets for 3 days.  She reports that this controlled her pain.  She requests a change in her current pain medications.  Medications: I have reviewed the patient's current medications.  Allergies:  Allergies  Allergen Reactions  . Latex Hives    Past Medical History:  Diagnosis Date  . Family history of lung cancer   . Family history of prostate cancer   . Family history of uterine cancer   . Gallstones 10/2017  . Pancreatic cancer ([place])   . Pneumothorax, closed, traumatic    years ago    Past Surgical History:  Procedure Laterality Date  . BILIARY STENT PLACEMENT  10/31/2017   Procedure: BILIARY STENT PLACEMENT;  Surgeon: [name], MD;  Location: [facility] ENDOSCOPY;  Service: Gastroenterology;;  . ERCP N/A  10/31/2017   Procedure: ENDOSCOPIC RETROGRADE CHOLANGIOPANCREATOGRAPHY (ERCP);  Surgeon: [name], MD;  Location: [facility] ENDOSCOPY;  Service: Gastroenterology;  Laterality: N/A;  . ESOPHAGOGASTRODUODENOSCOPY N/A 11/08/2017   Procedure: ESOPHAGOGASTRODUODENOSCOPY (EGD);  Surgeon: Milus Banister, MD;  Location: [facility] ENDOSCOPY;  Service: Endoscopy;  Laterality: N/A;  . EUS N/A 11/08/2017   Procedure: UPPER ENDOSCOPIC ULTRASOUND (EUS) RADIAL;  Surgeon: Milus Banister, MD;  Location: [facility] ENDOSCOPY;  Service: Endoscopy;  Laterality: N/A;  . FINE NEEDLE ASPIRATION  11/08/2017   Procedure: FINE  NEEDLE ASPIRATION;  Surgeon: Milus Banister, MD;  Location: [facility] ENDOSCOPY;  Service: Endoscopy;;  . IR IMAGING GUIDED PORT INSERTION  11/15/2017  . SPHINCTEROTOMY  10/31/2017   Procedure: SPHINCTEROTOMY;  Surgeon: [name], MD;  Location: [facility] ENDOSCOPY;  Service: Gastroenterology;;  . TONSILLECTOMY      Family History  Problem Relation Age of Onset  . Diabetes Mother   . Hypertension Mother   . COPD Mother   . Uterine cancer Mother 19       had hysterectomy  . Diabetes Sister   . Hypertension Sister   . Lung cancer Maternal Grandmother        lung cancer  . Hypertension Sister     Social History   Socioeconomic History  . Marital status: Single    Spouse name: Not on file  . Number of children: Not on file  . Years of education: Not on file  . Highest education level: Not on file  Occupational History  . Occupation: unemployed  Social Needs  . Financial resource strain: Not on file  . Food insecurity:    Worry: Not on file    Inability: Not on file  . Transportation needs:    Medical: Not on file    Non-medical: Not on file  Tobacco Use  . Smoking status: Current Every Day Smoker    Packs/day: 1.00    Years: 37.00    Pack years: 37.00    Types: Cigarettes  . Smokeless tobacco: Never Used  Substance and Sexual Activity  . Alcohol use: Not Currently  . Drug use: Never  . Sexual activity: Not Currently  Lifestyle  . Physical activity:    Days per week: Not on file    Minutes per session: Not on file  . Stress: Not on file  Relationships  . Social connections:    Talks on phone: Not on file    Gets together: Not on file    Attends religious service: Not on file    Active member of club or organization: Not on file    Attends meetings of clubs or organizations: Not on file    Relationship status: Not on file  . Intimate partner violence:    Fear of current or ex partner: Not on file    Emotionally abused: Not on file    Physically abused: Not on file      Forced sexual activity: Not on file  Other Topics Concern  . Not on file  Social History Narrative  . Not on file    Past Medical History, Surgical history, Social history, and Family history were reviewed and updated as appropriate.   Please see review of systems for further details on the patient's review from today.   Review of Systems:  Review of Systems  Constitutional: Negative for activity change, appetite change, chills, diaphoresis and fever.  Respiratory: Negative for cough, chest tightness and shortness of breath.   Cardiovascular: Negative for chest pain,  palpitations and leg swelling.  Gastrointestinal: Positive for abdominal pain. Negative for abdominal distention, constipation, diarrhea, nausea and vomiting.    Objective:   Physical Exam:  BP 117/82 (BP Location: Left Arm, Patient Position: Sitting)   Pulse 80   Temp 97.9 F (36.6 C) (Oral)   Resp 18   Ht 4' 11"  (1.499 m)   Wt 123 lb 1.6 oz (55.8 kg)   SpO2 99%   BMI 24.86 kg/m  ECOG: 1  Physical Exam Constitutional:      General: She is not in acute distress.    Appearance: She is not diaphoretic.  HENT:     Head: Normocephalic and atraumatic.  Cardiovascular:     Rate and Rhythm: Normal rate and regular rhythm.     Heart sounds: Normal heart sounds. No murmur. No friction rub. No gallop.   Pulmonary:     Effort: Pulmonary effort is normal. No respiratory distress.     Breath sounds: Normal breath sounds. No wheezing or rales.  Abdominal:     General: Bowel sounds are normal. There is no distension.     Palpations: Abdomen is soft. There is no mass.     Tenderness: There is abdominal tenderness (bilateral upper abdominal tenderness). There is no guarding or rebound.  Skin:    General: Skin is warm and dry.     Findings: No erythema or rash.  Neurological:     Mental Status: She is alert.     Coordination: Coordination normal.  Psychiatric:        Behavior: Behavior normal.        Thought  Content: Thought content normal.        Judgment: Judgment normal.     Lab Review:     Component Value Date/Time   NA 139 02/05/2018 0955   K 4.1 02/05/2018 0955   CL 104 02/05/2018 0955   CO2 25 02/05/2018 0955   GLUCOSE 89 02/05/2018 0955   BUN <4 (L) 02/05/2018 0955   CREATININE 0.62 02/05/2018 0955   CALCIUM 9.2 02/05/2018 0955   PROT 7.5 02/05/2018 0955   ALBUMIN 3.9 02/05/2018 0955   AST 24 02/05/2018 0955   ALT 34 02/05/2018 0955   ALKPHOS 219 (H) 02/05/2018 0955   BILITOT 0.3 02/05/2018 0955   GFRNONAA >60 02/05/2018 0955   GFRAA >60 02/05/2018 0955       Component Value Date/Time   WBC 11.3 (H) 02/05/2018 0955   WBC 29.0 (H) 12/12/2017 0740   RBC 5.84 (H) 02/05/2018 0955   HGB 11.7 (L) 02/05/2018 0955   HCT 37.7 02/05/2018 0955   PLT 315 02/05/2018 0955   MCV 64.6 (L) 02/05/2018 0955   MCH 20.0 (L) 02/05/2018 0955   MCHC 31.0 02/05/2018 0955   RDW 19.7 (H) 02/05/2018 0955   LYMPHSABS 2.5 02/05/2018 0955   MONOABS 0.7 02/05/2018 0955   EOSABS 0.1 02/05/2018 0955   BASOSABS 0.0 02/05/2018 0955   -------------------------------  Imaging from last 24 hours (if applicable):  Radiology interpretation: Ct Abdomen Pelvis W Contrast  Result Date: 02/04/2018 CLINICAL DATA:  Followup pancreas cancer. Intermittent right upper quadrant pain EXAM: CT ABDOMEN AND PELVIS WITH CONTRAST TECHNIQUE: Multidetector CT imaging of the abdomen and pelvis was performed using the standard protocol following bolus administration of intravenous contrast. CONTRAST:  139m OMNIPAQUE IOHEXOL 300 MG/ML  SOLN COMPARISON:  12/12/2017 FINDINGS: Lower chest: No acute abnormality. Hepatobiliary: There is no focal liver abnormality. The gallbladder appears normal. A common bile duct stent is  identified and appears well position. The proximal common bile duct appears increased in caliber when compared with previous exam. Currently this measures 1.6 cm in diameter. This is increased from 1.3 cm  previously. Moderate intrahepatic biliary ductal dilatation identified. Pancreas: Mass within the head of pancreas is again noted. Using the same measurements scheme as previously this measures 3.0 x 2.4 by 2.6 cm, image 44/2 and 29/5. Previously 3.1 x 2.4 by 2.9 cm. Persistent main duct dilatation from the pancreatic neck through the tail with atrophy of the body and tail of pancreas. Spleen: Normal in size without focal abnormality. Adrenals/Urinary Tract: Normal adrenal glands. No kidney mass or hydronephrosis.  Urinary bladder normal. Stomach/Bowel: Stomach is within normal limits. Appendix appears normal. No evidence of bowel wall thickening, distention, or inflammatory changes. Vascular/Lymphatic: Mild aortic atherosclerosis. No aneurysm. Index peripancreatic node measures 1.2 cm, image 37/2. Previously 1.2 cm. Aortocaval node measures 1.1 cm, image 49/2. Previously 0.8 cm. No pelvic or inguinal adenopathy. Reproductive: Uterus and bilateral adnexa are unremarkable. Other: No ascites.  No peritoneal nodule identified. Musculoskeletal: No acute or significant osseous findings. IMPRESSION: 1. No substantial change in size of mass involving head of pancreas. A aortocaval lymph node which appears increased in the interval measuring 1.1 cm versus 0.8 cm previously. Stable 1.2 cm peripancreatic node. 2. Dilated proximal common bile duct is mildly increased in caliber compared with 12/12/2017. The common bile duct stent remains in place in appears to be in appropriate position. Electronically Signed   By: Kerby Moors M.D.   On: 02/04/2018 15:58        This patient was seen with Dr. Burr Medico with my treatment plan reviewed with her. She expressed agreement with my medical management of this patient.   Addendum  I have seen the patient, examined her. I agree with the assessment and and plan and have edited the notes.   Jane Brooks complains of worsening abdominal pain lately. I reviewed her restaging CT from  02/04/2018 my self and discussed with her in details. She has stable disease overall, I recommend continue neoadjuvant chemo for a total of 4 months. We discussed her pan management in length. 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 I recommend her to increase her morphine IR from 7.74m to 180mas needed, and continue MS contin 1524m12h, will add cymbalta and flexeril to better control her pain. I also recommend her to see pain specialist Dr. HarMaryjean Kahe agrees with the plan. Lab reviewed, adequate for chemo tomorrow, will proceed. I will present her case in GI conference next week.  YanTruitt Merle/21/2020

## 2018-02-06 NOTE — Patient Instructions (Signed)
[facility] Discharge Instructions for Patients Receiving Chemotherapy  Today you received the following chemotherapy agents: Oxaliplatin, Leucovorin, Irinotecan, 5FU  To help prevent nausea and vomiting after your treatment, we encourage you to take your nausea medication as directed.   If you develop nausea and vomiting that is not controlled by your nausea medication, call the clinic.   BELOW ARE SYMPTOMS THAT SHOULD BE REPORTED IMMEDIATELY:  *FEVER GREATER THAN 100.5 F  *CHILLS WITH OR WITHOUT FEVER  NAUSEA AND VOMITING THAT IS NOT CONTROLLED WITH YOUR NAUSEA MEDICATION  *UNUSUAL SHORTNESS OF BREATH  *UNUSUAL BRUISING OR BLEEDING  TENDERNESS IN MOUTH AND THROAT WITH OR WITHOUT PRESENCE OF ULCERS  *URINARY PROBLEMS  *BOWEL PROBLEMS  UNUSUAL RASH Items with * indicate a potential emergency and should be followed up as soon as possible.  Feel free to call the clinic should you have any questions or concerns. The clinic phone number is [phone].  Please show the [facility] at check-in to the Emergency Department and triage nurse.

## 2018-02-08 ENCOUNTER — Inpatient Hospital Stay: Payer: Medicaid Other

## 2018-02-08 VITALS — BP 115/75 | HR 93 | Temp 97.6°F | Resp 17

## 2018-02-08 DIAGNOSIS — C250 Malignant neoplasm of head of pancreas: Secondary | ICD-10-CM

## 2018-02-08 DIAGNOSIS — Z5111 Encounter for antineoplastic chemotherapy: Secondary | ICD-10-CM | POA: Diagnosis not present

## 2018-02-08 MED ORDER — SODIUM CHLORIDE 0.9% FLUSH
10.0000 mL | INTRAVENOUS | Status: DC | PRN
  Administered 2018-02-08: 10 mL
  Filled 2018-02-08: qty 10

## 2018-02-08 MED ORDER — HEPARIN SOD (PORK) LOCK FLUSH 100 UNIT/ML IV SOLN
500.0000 [IU] | Freq: Once | INTRAVENOUS | Status: AC | PRN
  Administered 2018-02-08: 500 [IU]
  Filled 2018-02-08: qty 5

## 2018-02-13 ENCOUNTER — Other Ambulatory Visit: Payer: Medicaid Other

## 2018-02-13 ENCOUNTER — Ambulatory Visit: Payer: Medicaid Other | Admitting: Hematology

## 2018-02-13 ENCOUNTER — Ambulatory Visit: Payer: Medicaid Other

## 2018-02-14 ENCOUNTER — Other Ambulatory Visit: Payer: Self-pay | Admitting: Nurse Practitioner

## 2018-02-14 DIAGNOSIS — C250 Malignant neoplasm of head of pancreas: Secondary | ICD-10-CM

## 2018-02-14 NOTE — Telephone Encounter (Signed)
Printed and forwarded to Dr. Burr Medico

## 2018-02-15 ENCOUNTER — Other Ambulatory Visit: Payer: Self-pay | Admitting: Hematology

## 2018-02-15 DIAGNOSIS — C250 Malignant neoplasm of head of pancreas: Secondary | ICD-10-CM

## 2018-02-15 MED ORDER — MORPHINE SULFATE ER 15 MG PO TBCR: 15.0000 mg | EXTENDED_RELEASE_TABLET | Freq: Two times a day (BID) | ORAL | 0 refills | Status: DC

## 2018-02-15 MED FILL — MORPHINE SULF ER 15 MG TAB: 15 | 30 days supply | Qty: 60 | Fill #0

## 2018-02-18 NOTE — Progress Notes (Signed)
[place]   Telephone:([phone]) [phone] Fax:(336) [phone]   Clinic Follow up Note   Patient Care Team: Patient, No Pcp Per as PCP - General ([place]) Milus Banister, MD as Attending Physician (Gastroenterology) Truitt Merle, MD as Consulting Physician (Hematology)  Date of Service:  02/18/2018  CHIEF COMPLAINT: F/u on pancreatic cancer  SUMMARY OF ONCOLOGIC HISTORY: Oncology History   Cancer Staging Pancreatic cancer [facility]) Staging form: Exocrine Pancreas, AJCC 8th Edition - Clinical stage from 11/08/2017: Stage IB (cT2, cN0, cM0) - Signed by Truitt Merle, MD on 11/09/2017       Pancreatic cancer ([place])   10/30/2017 Imaging    MRI Abdomen 10/30/17  IMPRESSION: 1. Marked intra and extrahepatic biliary duct dilatation with abrupt cut off of the common bile duct at the pancreatic head. This is associated with marked dilatation of the main pancreatic duct also with an abrupt cut off at the level of the pancreatic head. Pancreatic parenchyma in the body and tail of pancreas is markedly atrophic. A subtle 2.5 x 3 cm lesion with differential signal intensity is identified in the head of the pancreas which appears slightly hypoenhancing on postcontrast imaging. Together, these features are highly concerning for pancreatic adenocarcinoma. EUS is recommended to further evaluate. 2. Portal vein is patent although there is some mass-effect on the portal splenic confluence and IVC. Fat planes around celiac axis and SMA appear preserved. 3. Upper normal to borderline portal caval lymph node associated with small right para-aortic lymphadenopathy.      10/30/2017 Tumor Marker    Baseline Ca 19-9 at 140    10/31/2017 Procedure    ERCP by Dr. Lyndel Safe 10/31/17  IMPRESSION Malignant appearing CBD stricture (due to pancreatic mass) status post sphincterotomy and stent insertion.    11/08/2017 Cancer Staging    Staging form: Exocrine Pancreas, AJCC 8th Edition -  Clinical stage from 11/08/2017: Stage IB (cT2, cN0, cM0) - Signed by Truitt Merle, MD on 11/09/2017    11/08/2017 Procedure    EUS and EGD by Dr. Ardis Hughs 11/08/17  IMPRESSION -Very vague, irregularly bordered, approximately 2.6cm mass was found in the head of pancreas. This causes biliary and pancreatic duct obstruction. The previously plastic biliary stent is in good position. The mass directly abuts and attenuates the PV/SMV confluence, suggesting invasion. FNA performed and the preliminary cytology review is positive for maligancy (adenocarcinoma). - [place] [facility] office will arrange referrals to medical and surgical oncology. - Case to be discussed at upcoming multidisciplinary GI tumor conference.    11/08/2017 Initial Biopsy    Biopsy Cytology 11/08/17 Diagnosis FINE NEEDLE ASPIRATION, ENDOSCOPIC, PANCREAS HEAD (SPECIMEN 1 OF 1 COLLECTED 11/08/17): MALIGNANT CELLS CONSISTENT WITH ADENOCARCINOMA.    11/09/2017 Initial Diagnosis    Pancreatic cancer ([place])    11/15/2017 Imaging    CT Chest and Pelvis WO Contrast 11/15/17 IMPRESSION: 1. No findings of active malignancy in the chest or pelvis. 2. Aortic Atherosclerosis (ICD10-I70.0). Coronary atherosclerosis. 3. Faint centrilobular nodularity in the lung apices, raising the possibility of hypersensitivity pneumonitis or less likely respiratory bronchiolitis. Airway thickening is present, suggesting bronchitis or reactive airways disease.    11/19/2017 -  Chemotherapy    FOLFIRINOX q2weeks with Neulasta injection for 4 months starting 11/19/17      CURRENT THERAPY:  neoadjuvantFOLFIRINOX q2weeks withUdenycainjection for 4 months starting 11/19/17  INTERVAL HISTORY:  Jane Brooks is here for a follow up of pancreatic cancer. She presents to the clinic today by herself. She notes she has been tolerating  treatment moderately well with lower dose. She still feels tired after treatment but much improved. She has been able to gain 3  pounds as she tries to eat better.  She notes her abdominal pain is better managed with MS Contin, Cymbalta and Flexeril. Pain is overall getting controlled.   She notes she got her puppy [name] recently and has been doing well with him.      REVIEW OF SYSTEMS:   Constitutional: Denies fevers, chills or abnormal weight loss (+) improved appetite and weight gain (+) Fatigue improved  Eyes: Denies blurriness of vision Ears, nose, mouth, throat, and face: Denies mucositis or sore throat Respiratory: Denies cough, dyspnea or wheezes Cardiovascular: Denies palpitation, chest discomfort or lower extremity swelling Gastrointestinal:  Denies nausea, heartburn or change in bowel habits Skin: Denies abnormal skin rashes Lymphatics: Denies new lymphadenopathy or easy bruising Neurological:Denies numbness, tingling or new weaknesses Behavioral/Psych: Mood is stable, no new changes  All other systems were reviewed with the patient and are negative.  MEDICAL HISTORY:  Past Medical History:  Diagnosis Date  . Family history of lung cancer   . Family history of prostate cancer   . Family history of uterine cancer   . Gallstones 10/2017  . Pancreatic cancer ([place])   . Pneumothorax, closed, traumatic    years ago    SURGICAL HISTORY: Past Surgical History:  Procedure Laterality Date  . BILIARY STENT PLACEMENT  10/31/2017   Procedure: BILIARY STENT PLACEMENT;  Surgeon: [name], MD;  Location: [facility] ENDOSCOPY;  Service: Gastroenterology;;  . ERCP N/A 10/31/2017   Procedure: ENDOSCOPIC RETROGRADE CHOLANGIOPANCREATOGRAPHY (ERCP);  Surgeon: [name], MD;  Location: [facility] ENDOSCOPY;  Service: Gastroenterology;  Laterality: N/A;  . ESOPHAGOGASTRODUODENOSCOPY N/A 11/08/2017   Procedure: ESOPHAGOGASTRODUODENOSCOPY (EGD);  Surgeon: Milus Banister, MD;  Location: [facility] ENDOSCOPY;  Service: Endoscopy;  Laterality: N/A;  . EUS N/A 11/08/2017   Procedure: UPPER ENDOSCOPIC ULTRASOUND (EUS) RADIAL;   Surgeon: Milus Banister, MD;  Location: [facility] ENDOSCOPY;  Service: Endoscopy;  Laterality: N/A;  . FINE NEEDLE ASPIRATION  11/08/2017   Procedure: FINE NEEDLE ASPIRATION;  Surgeon: Milus Banister, MD;  Location: [facility] ENDOSCOPY;  Service: Endoscopy;;  . IR IMAGING GUIDED PORT INSERTION  11/15/2017  . SPHINCTEROTOMY  10/31/2017   Procedure: SPHINCTEROTOMY;  Surgeon: [name], MD;  Location: [facility] ENDOSCOPY;  Service: Gastroenterology;;  . TONSILLECTOMY      I have reviewed the social history and family history with the patient and they are unchanged from previous note.  ALLERGIES:  is allergic to latex.  MEDICATIONS:  Current Outpatient Medications  Medication Sig Dispense Refill  . cyclobenzaprine (FLEXERIL) 5 MG tablet Take 1 tablet (5 mg total) by mouth 3 (three) times daily as needed for muscle spasms. 30 tablet 0  . DULoxetine (CYMBALTA) 20 MG capsule Take 1 capsule (20 mg total) by mouth 2 (two) times daily. Take once daily for 1 week then increase to BID 60 capsule 3  . HYDROmorphone (DILAUDID) 4 MG tablet Take 1 tablet (4 mg total) by mouth every 6 (six) hours as needed for severe pain. 12 tablet 0  . lidocaine-prilocaine (EMLA) cream Apply 1 application topically as needed. (Patient not taking: Reported on 01/14/2018) 30 g 2  . loperamide (IMODIUM) 2 MG capsule Take 2 capsules (4 mg total) by mouth every 6 (six) hours as needed for diarrhea or loose stools. 60 capsule 0  . loratadine (CLARITIN) 10 MG tablet Take 1 tablet (10 mg total) by mouth daily. Take  for 5 days after the Udenyca injection 30 tablet 2  . morphine (MS CONTIN) 15 MG 12 hr tablet Take 1 tablet (15 mg total) by mouth every 12 (twelve) hours. 60 tablet 0  . morphine (MSIR) 15 MG tablet Take 1 tablet (15 mg total) by mouth every 6 (six) hours as needed for severe pain. 60 tablet 0  . ondansetron (ZOFRAN) 8 MG tablet Take 1 tablet (8 mg total) by mouth every 8 (eight) hours as needed for nausea or vomiting. 30 tablet 0    . prochlorperazine (COMPAZINE) 10 MG tablet Take 1 tablet (10 mg total) by mouth every 6 (six) hours as needed for nausea or vomiting. 30 tablet 3  . promethazine (PHENERGAN) 25 MG tablet Take 1 tablet (25 mg total) by mouth every 6 (six) hours as needed for nausea or vomiting. 30 tablet 0   No current facility-administered medications for this visit.     PHYSICAL EXAMINATION: ECOG PERFORMANCE STATUS: 1 - Symptomatic but completely ambulatory  Vitals:   02/20/18 1001  BP: 130/86  Pulse: 89  Resp: 18  Temp: 98.2 F (36.8 C)  SpO2: 99%   Filed Weights   02/20/18 1001  Weight: 126 lb 11.2 oz (57.5 kg)    GENERAL:alert, no distress and comfortable SKIN: skin color, texture, turgor are normal, no rashes or significant lesions EYES: normal, Conjunctiva are pink and non-injected, sclera clear OROPHARYNX:no exudate, no erythema and lips, buccal mucosa, and tongue normal  NECK: supple, thyroid normal size, non-tender, without nodularity LYMPH:  no palpable lymphadenopathy in the cervical, axillary or inguinal LUNGS: clear to auscultation and percussion with normal breathing effort HEART: regular rate & rhythm and no murmurs and no lower extremity edema ABDOMEN:abdomen soft, non-tender and normal bowel sounds Musculoskeletal:no cyanosis of digits and no clubbing  NEURO: alert & oriented x 3 with fluent speech, no focal motor/sensory deficits  LABORATORY DATA:  I have reviewed the data as listed CBC Latest Ref Rng & Units 02/20/2018 02/05/2018 01/23/2018  WBC 4.0 - 10.5 K/uL 6.8 11.3(H) 5.5  Hemoglobin 12.0 - 15.0 g/dL 10.7(L) 11.7(L) 10.1(L)  Hematocrit 36.0 - 46.0 % 34.0(L) 37.7 32.2(L)  Platelets 150 - 400 K/uL 200 315 269     CMP Latest Ref Rng & Units 02/20/2018 02/05/2018 01/23/2018  Glucose 70 - 99 mg/dL 87 89 108(H)  BUN 6 - 20 mg/dL 4(L) <4(L) 6  Creatinine 0.44 - 1.00 mg/dL 0.63 0.62 0.59  Sodium 135 - 145 mmol/L 139 139 139  Potassium 3.5 - 5.1 mmol/L 4.1 4.1 3.8  Chloride  98 - 111 mmol/L 103 104 105  CO2 22 - 32 mmol/L 28 25 24   Calcium 8.9 - 10.3 mg/dL 9.3 9.2 8.8(L)  Total Protein 6.5 - 8.1 g/dL 7.2 7.5 6.8  Total Bilirubin 0.3 - 1.2 mg/dL 0.3 0.3 0.3  Alkaline Phos 38 - 126 U/L 261(H) 219(H) 292(H)  AST 15 - 41 U/L 54(H) 24 65(H)  ALT 0 - 44 U/L 55(H) 34 72(H)      RADIOGRAPHIC STUDIES: I have personally reviewed the radiological images as listed and agreed with the findings in the report. No results found.   ASSESSMENT & PLAN:  Jane Brooks is a 48 y.o. female with   1. 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  Due to the borderline resectable tumor, we recommended neoadjuvant chemotherapy  -Currently on neoadjuvant FOLFIRINOX with Udenyca on day 3 every 2 weeks since 11/19/17. She is tolerating reduced dose  chemo well. .  -Labs reviewed, WBC at 6.5, Hg at 10.7, BUN at 4, AST/ALT slightly elevated, alk phos at 261. Since she did not have neutropenia without Udenyca last cycle, I will hold off Udenyca for now.  -We discussed her recent CT scan in GI tumor board, she has stable disease, we recommend her to continue current chemo treatment for about 2 more cycles before proceeding with Surgery.  -She plans to consult with Dr. Barry Dienes on 03/19/18 to discuss surgery  -I discussed based on her response to chemo and how much residual cancer is remaining after surgery, she may need neoadjuvant radiation.  -F/u in 2 weeks before cycle 8   2. Abdominal pain, secondary to #1   -Currently on MS Contin 15 mg every 12 hours, and use morphine 31m PRN, Cymbalta and Flexeril. -Pain better controlled lately.   -I will refill her MSIR today (02/20/18) -will hold on pain clinic referral for now, but she agrees to see a pain specialist if she requests much higher dose or longer tern narcotics   3. Social and FAcupuncturist-I previously referred to SW. -She currently stays in a hotel, and will move to  apartment, under the help of social service -She now has her puppy and doing well with him.   4. 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 Genetics  -She was seen by the gDietitian Genetic test has been held due to lack of insurance.   6. Nausea and weight loss -She is not eating well and feels nauseated after eating, and is therefore losing weight. -Continue antiemetics with Compazine, Phenergan and Zofran.  -Continue nutritional supplements such as Ensure.  -She has been able to gain weight recently.    Plan  -Labs reviewed and adequate to proceed with FOLFIRINOX today, same dose reduction. No Udenyca with this cycle  -Lab, flush, f/u and chemo FOLFIRINOX in 2 and 4 weeks    No orders of the defined types were placed in this encounter.  All questions were answered. The patient knows to call the clinic with any problems, questions or concerns. No barriers to learning was detected. I spent 20 minutes counseling the patient face to face. The total time spent in the appointment was 25 minutes and more than 50% was on counseling and review of test results     YTruitt Merle MD 02/18/2018   I, AJoslyn Devon am acting as scribe for YTruitt Merle MD.   I have reviewed the above documentation for accuracy and completeness, and I agree with the above.

## 2018-02-20 ENCOUNTER — Encounter: Payer: Self-pay | Admitting: Hematology

## 2018-02-20 ENCOUNTER — Inpatient Hospital Stay: Payer: Medicaid Other

## 2018-02-20 ENCOUNTER — Telehealth: Payer: Self-pay | Admitting: Hematology

## 2018-02-20 ENCOUNTER — Inpatient Hospital Stay: Payer: Medicaid Other | Admitting: General Practice

## 2018-02-20 ENCOUNTER — Inpatient Hospital Stay: Payer: Medicaid Other | Attending: Hematology | Admitting: Hematology

## 2018-02-20 VITALS — BP 130/86 | HR 89 | Temp 98.2°F | Resp 18 | Ht 59.0 in | Wt 126.7 lb

## 2018-02-20 DIAGNOSIS — C250 Malignant neoplasm of head of pancreas: Secondary | ICD-10-CM

## 2018-02-20 DIAGNOSIS — D563 Thalassemia minor: Secondary | ICD-10-CM | POA: Diagnosis not present

## 2018-02-20 DIAGNOSIS — T451X5A Adverse effect of antineoplastic and immunosuppressive drugs, initial encounter: Secondary | ICD-10-CM | POA: Diagnosis not present

## 2018-02-20 DIAGNOSIS — R918 Other nonspecific abnormal finding of lung field: Secondary | ICD-10-CM | POA: Diagnosis not present

## 2018-02-20 DIAGNOSIS — D509 Iron deficiency anemia, unspecified: Secondary | ICD-10-CM | POA: Insufficient documentation

## 2018-02-20 DIAGNOSIS — Z79899 Other long term (current) drug therapy: Secondary | ICD-10-CM | POA: Diagnosis not present

## 2018-02-20 DIAGNOSIS — Z8042 Family history of malignant neoplasm of prostate: Secondary | ICD-10-CM

## 2018-02-20 DIAGNOSIS — R110 Nausea: Secondary | ICD-10-CM | POA: Diagnosis not present

## 2018-02-20 DIAGNOSIS — Z801 Family history of malignant neoplasm of trachea, bronchus and lung: Secondary | ICD-10-CM | POA: Diagnosis not present

## 2018-02-20 DIAGNOSIS — Z808 Family history of malignant neoplasm of other organs or systems: Secondary | ICD-10-CM | POA: Insufficient documentation

## 2018-02-20 DIAGNOSIS — R634 Abnormal weight loss: Secondary | ICD-10-CM | POA: Insufficient documentation

## 2018-02-20 DIAGNOSIS — Z5111 Encounter for antineoplastic chemotherapy: Secondary | ICD-10-CM | POA: Diagnosis present

## 2018-02-20 DIAGNOSIS — I700 Atherosclerosis of aorta: Secondary | ICD-10-CM | POA: Diagnosis not present

## 2018-02-20 DIAGNOSIS — D6481 Anemia due to antineoplastic chemotherapy: Secondary | ICD-10-CM | POA: Insufficient documentation

## 2018-02-20 DIAGNOSIS — F17210 Nicotine dependence, cigarettes, uncomplicated: Secondary | ICD-10-CM | POA: Insufficient documentation

## 2018-02-20 DIAGNOSIS — R1013 Epigastric pain: Secondary | ICD-10-CM

## 2018-02-20 DIAGNOSIS — R109 Unspecified abdominal pain: Secondary | ICD-10-CM | POA: Diagnosis not present

## 2018-02-20 LAB — CBC WITH DIFFERENTIAL (CANCER CENTER ONLY)
Abs Immature Granulocytes: 0.02 10*3/uL (ref 0.00–0.07)
Basophils Absolute: 0 10*3/uL (ref 0.0–0.1)
Basophils Relative: 0 %
Eosinophils Absolute: 0.3 10*3/uL (ref 0.0–0.5)
Eosinophils Relative: 5 %
HCT: 34 % — ABNORMAL LOW (ref 36.0–46.0)
HEMOGLOBIN: 10.7 g/dL — AB (ref 12.0–15.0)
Immature Granulocytes: 0 %
Lymphocytes Relative: 32 %
Lymphs Abs: 2.2 10*3/uL (ref 0.7–4.0)
MCH: 20.3 pg — ABNORMAL LOW (ref 26.0–34.0)
MCHC: 31.5 g/dL (ref 30.0–36.0)
MCV: 64.5 fL — ABNORMAL LOW (ref 80.0–100.0)
MONO ABS: 0.7 10*3/uL (ref 0.1–1.0)
Monocytes Relative: 11 %
Neutro Abs: 3.5 10*3/uL (ref 1.7–7.7)
Neutrophils Relative %: 52 %
Platelet Count: 200 10*3/uL (ref 150–400)
RBC: 5.27 MIL/uL — ABNORMAL HIGH (ref 3.87–5.11)
RDW: 19.2 % — ABNORMAL HIGH (ref 11.5–15.5)
WBC Count: 6.8 10*3/uL (ref 4.0–10.5)
nRBC: 0 % (ref 0.0–0.2)

## 2018-02-20 LAB — CMP (CANCER CENTER ONLY)
ALT: 55 U/L — AB (ref 0–44)
AST: 54 U/L — ABNORMAL HIGH (ref 15–41)
Albumin: 3.8 g/dL (ref 3.5–5.0)
Alkaline Phosphatase: 261 U/L — ABNORMAL HIGH (ref 38–126)
Anion gap: 8 (ref 5–15)
BUN: 4 mg/dL — ABNORMAL LOW (ref 6–20)
CALCIUM: 9.3 mg/dL (ref 8.9–10.3)
CO2: 28 mmol/L (ref 22–32)
Chloride: 103 mmol/L (ref 98–111)
Creatinine: 0.63 mg/dL (ref 0.44–1.00)
GFR, Est AFR Am: >60 mL/min (ref >60)
GFR, Estimated: >60 mL/min (ref >60)
GLUCOSE: 87 mg/dL (ref 70–99)
Potassium: 4.1 mmol/L (ref 3.5–5.1)
Sodium: 139 mmol/L (ref 135–145)
Total Bilirubin: 0.3 mg/dL (ref 0.3–1.2)
Total Protein: 7.2 g/dL (ref 6.5–8.1)

## 2018-02-20 MED ORDER — SODIUM CHLORIDE 0.9 % IV SOLN
2000.0000 mg/m2 | INTRAVENOUS | Status: DC
  Administered 2018-02-20: 3100 mg via INTRAVENOUS
  Filled 2018-02-20: qty 62

## 2018-02-20 MED ORDER — DEXAMETHASONE SODIUM PHOSPHATE 10 MG/ML IJ SOLN
10.0000 mg | Freq: Once | INTRAMUSCULAR | Status: AC
  Administered 2018-02-20: 10 mg via INTRAVENOUS

## 2018-02-20 MED ORDER — PALONOSETRON HCL INJECTION 0.25 MG/5ML
0.2500 mg | Freq: Once | INTRAVENOUS | Status: AC
  Administered 2018-02-20: 0.25 mg via INTRAVENOUS

## 2018-02-20 MED ORDER — ATROPINE SULFATE 0.4 MG/ML IJ SOLN
INTRAMUSCULAR | Status: AC
  Filled 2018-02-20: qty 1

## 2018-02-20 MED ORDER — DEXTROSE 5 % IV SOLN
Freq: Once | INTRAVENOUS | Status: AC
  Administered 2018-02-20: 11:00:00 via INTRAVENOUS
  Filled 2018-02-20: qty 250

## 2018-02-20 MED ORDER — OXALIPLATIN CHEMO INJECTION 100 MG/20ML
65.0000 mg/m2 | Freq: Once | INTRAVENOUS | Status: AC
  Administered 2018-02-20: 100 mg via INTRAVENOUS
  Filled 2018-02-20: qty 20

## 2018-02-20 MED ORDER — SODIUM CHLORIDE 0.9% FLUSH
10.0000 mL | INTRAVENOUS | Status: DC | PRN
  Administered 2018-02-20: 10 mL
  Filled 2018-02-20: qty 10

## 2018-02-20 MED ORDER — DEXAMETHASONE SODIUM PHOSPHATE 10 MG/ML IJ SOLN
INTRAMUSCULAR | Status: AC
  Filled 2018-02-20: qty 1

## 2018-02-20 MED ORDER — PALONOSETRON HCL INJECTION 0.25 MG/5ML
INTRAVENOUS | Status: AC
  Filled 2018-02-20: qty 5

## 2018-02-20 MED ORDER — ATROPINE SULFATE 1 MG/ML IJ SOLN: 0.5000 mg | Freq: Once | INTRAMUSCULAR | Status: DC | PRN

## 2018-02-20 MED ORDER — MORPHINE SULFATE 15 MG PO TABS: 15.0000 mg | ORAL_TABLET | Freq: Four times a day (QID) | ORAL | 0 refills | Status: DC | PRN

## 2018-02-20 MED ORDER — LEUCOVORIN CALCIUM INJECTION 350 MG
320.0000 mg/m2 | Freq: Once | INTRAVENOUS | Status: AC
  Administered 2018-02-20: 500 mg via INTRAVENOUS
  Filled 2018-02-20: qty 25

## 2018-02-20 MED ORDER — IRINOTECAN HCL CHEMO INJECTION 100 MG/5ML
120.0000 mg/m2 | Freq: Once | INTRAVENOUS | Status: AC
  Administered 2018-02-20: 180 mg via INTRAVENOUS
  Filled 2018-02-20: qty 9

## 2018-02-20 MED FILL — MORPHINE SULFATE IR 15 MG T: 15 | 30 days supply | Qty: 120 | Fill #0

## 2018-02-20 NOTE — Progress Notes (Signed)
[place] CSW Progress Notes  Check in visit w patient in infusion.  Currently in stable housing and has stable income.  This is allowing her to focus on recovery, states she is finally able to sleep well at night.  Enjoying the company of her new dog, cooking and taking care of apartment.  Anticipating meeting w surgeon and learning details of next steps to treat cancer.  Has been discharged from [employer] as goals of stable housing are met, encouraged to continue to reach out needed for support/resources.  Edwyna Shell, LCSW Clinical Social Worker Phone:  [phone]

## 2018-02-20 NOTE — Telephone Encounter (Signed)
Called patient and scheduled appt per 02/05 los.  Patient aware of appt time and dates.

## 2018-02-20 NOTE — Patient Instructions (Signed)
Jane Brooks Discharge Instructions for Patients Receiving Chemotherapy  Today you received the following chemotherapy agents: Oxaliplatin, Leucovorin, Irinotecan, 5FU  To help prevent nausea and vomiting after your treatment, we encourage you to take your nausea medication as directed.   If you develop nausea and vomiting that is not controlled by your nausea medication, call the clinic.   BELOW ARE SYMPTOMS THAT SHOULD BE REPORTED IMMEDIATELY:  *FEVER GREATER THAN 100.5 F  *CHILLS WITH OR WITHOUT FEVER  NAUSEA AND VOMITING THAT IS NOT CONTROLLED WITH YOUR NAUSEA MEDICATION  *UNUSUAL SHORTNESS OF BREATH  *UNUSUAL BRUISING OR BLEEDING  TENDERNESS IN MOUTH AND THROAT WITH OR WITHOUT PRESENCE OF ULCERS  *URINARY PROBLEMS  *BOWEL PROBLEMS  UNUSUAL RASH Items with * indicate a potential emergency and should be followed up as soon as possible.  Feel free to call the clinic should you have any questions or concerns. The clinic phone number is [phone].  Please show the [place] at check-in to the Emergency Department and triage nurse.

## 2018-02-21 ENCOUNTER — Encounter: Payer: Self-pay | Admitting: Hematology

## 2018-02-21 LAB — CANCER ANTIGEN 19-9: CAN 19-9: 73 U/mL — AB (ref 0–35)

## 2018-02-22 ENCOUNTER — Inpatient Hospital Stay: Payer: Medicaid Other

## 2018-02-22 VITALS — BP 122/78 | HR 88 | Temp 98.1°F | Resp 18

## 2018-02-22 DIAGNOSIS — Z5111 Encounter for antineoplastic chemotherapy: Secondary | ICD-10-CM | POA: Diagnosis not present

## 2018-02-22 DIAGNOSIS — C250 Malignant neoplasm of head of pancreas: Secondary | ICD-10-CM

## 2018-02-22 MED ORDER — HEPARIN SOD (PORK) LOCK FLUSH 100 UNIT/ML IV SOLN
500.0000 [IU] | Freq: Once | INTRAVENOUS | Status: AC | PRN
  Administered 2018-02-22: 500 [IU]
  Filled 2018-02-22: qty 5

## 2018-02-22 MED ORDER — SODIUM CHLORIDE 0.9% FLUSH
10.0000 mL | INTRAVENOUS | Status: DC | PRN
  Administered 2018-02-22: 10 mL
  Filled 2018-02-22: qty 10

## 2018-03-04 NOTE — Progress Notes (Signed)
Jane Brooks   [phone] [phone]) [phone]   Clinic Follow up Note   Patient Care Team: Patient, No Pcp Per as PCP - General ([place]) Milus Banister, MD as Attending Physician (Gastroenterology) Truitt Merle, MD as Consulting Physician (Hematology)  Date of Service:  03/06/2018  CHIEF COMPLAINT: F/u on pancreatic cancer  SUMMARY OF ONCOLOGIC HISTORY: Oncology History   Cancer Staging Pancreatic cancer [facility]) Staging form: Exocrine Pancreas, AJCC 8th Edition - Clinical stage from 11/08/2017: Stage IB (cT2, cN0, cM0) - Signed by Truitt Merle, MD on 11/09/2017       Pancreatic cancer ([place])   10/30/2017 Imaging    MRI Abdomen 10/30/17  IMPRESSION: 1. Marked intra and extrahepatic biliary duct dilatation with abrupt cut off of the common bile duct at the pancreatic head. This is associated with marked dilatation of the main pancreatic duct also with an abrupt cut off at the level of the pancreatic head. Pancreatic parenchyma in the body and tail of pancreas is markedly atrophic. A subtle 2.5 x 3 cm lesion with differential signal intensity is identified in the head of the pancreas which appears slightly hypoenhancing on postcontrast imaging. Together, these features are highly concerning for pancreatic adenocarcinoma. EUS is recommended to further evaluate. 2. Portal vein is patent although there is some mass-effect on the portal splenic confluence and IVC. Fat planes around celiac axis and SMA appear preserved. 3. Upper normal to borderline portal caval lymph node associated with small right para-aortic lymphadenopathy.      10/30/2017 Tumor Marker    Baseline Ca 19-9 at 140    10/31/2017 Procedure    ERCP by Dr. Lyndel Safe 10/31/17  IMPRESSION Malignant appearing CBD stricture (due to pancreatic mass) status post sphincterotomy and stent insertion.    11/08/2017 Cancer Staging    Staging form: Exocrine Pancreas, AJCC 8th Edition -  Clinical stage from 11/08/2017: Stage IB (cT2, cN0, cM0) - Signed by Truitt Merle, MD on 11/09/2017    11/08/2017 Procedure    EUS and EGD by Dr. Ardis Hughs 11/08/17  IMPRESSION -Very vague, irregularly bordered, approximately 2.6cm mass was found in the head of pancreas. This causes biliary and pancreatic duct obstruction. The previously plastic biliary stent is in good position. The mass directly abuts and attenuates the PV/SMV confluence, suggesting invasion. FNA performed and the preliminary cytology review is positive for maligancy (adenocarcinoma). - [facility] office will arrange referrals to medical and surgical oncology. - Case to be discussed at upcoming multidisciplinary GI tumor conference.    11/08/2017 Initial Biopsy    Biopsy Cytology 11/08/17 Diagnosis FINE NEEDLE ASPIRATION, ENDOSCOPIC, PANCREAS HEAD (SPECIMEN 1 OF 1 COLLECTED 11/08/17): MALIGNANT CELLS CONSISTENT WITH ADENOCARCINOMA.    11/09/2017 Initial Diagnosis    Pancreatic cancer ([place])    11/15/2017 Imaging    CT Chest and Pelvis WO Contrast 11/15/17 IMPRESSION: 1. No findings of active malignancy in the chest or pelvis. 2. Aortic Atherosclerosis (ICD10-I70.0). Coronary atherosclerosis. 3. Faint centrilobular nodularity in the lung apices, raising the possibility of hypersensitivity pneumonitis or less likely respiratory bronchiolitis. Airway thickening is present, suggesting bronchitis or reactive airways disease.    11/19/2017 -  Chemotherapy    FOLFIRINOX q2weeks with Neulasta injection for 4 months starting 11/19/17    02/04/2018 Imaging    CT AP W Contrast 02/04/18  IMPRESSION: 1. No substantial change in size of mass involving head of pancreas. A aortocaval lymph node which appears increased in the interval measuring 1.1 cm versus 0.8 cm previously. Stable  1.2 cm peripancreatic node. 2. Dilated proximal common bile duct is mildly increased in caliber compared with 12/12/2017. The common bile duct stent  remains in place in appears to be in appropriate position.       CURRENT THERAPY:  neoadjuvantFOLFIRINOX q2weeks withUdenycainjection for 4 months starting 11/19/17  INTERVAL HISTORY:  Jane Brooks is here for a follow up and treatment. She presents to the clinic today by herself. She notes she is feeling better mostly. She notes her last cycle chemo went well for her with no obvious side effects and she had adequate appetite and energy. She plans to see Dr. Barry Dienes on 03/19/18.  She notes she is on supervised probation because she has 24 hours of community service. She is trying to be put on unsupervised probation and off community service. She has requested a letter explaining her diagnosis and treatment plans to give to the court. She notes her abdominal pain has been better controlled when flexeril and Cymbalta was added. She is also taking MS Contin 42m BID and morphine 113mq6hours.  She notes she currently smoked 3/4 a ppd.     REVIEW OF SYSTEMS:   Constitutional: Denies fevers, chills or abnormal weight loss Eyes: Denies blurriness of vision Ears, nose, mouth, throat, and face: Denies mucositis or sore throat Respiratory: Denies cough, dyspnea or wheezes Cardiovascular: Denies palpitation, chest discomfort or lower extremity swelling Gastrointestinal:  Denies nausea, heartburn or change in bowel habits (+) abdomina pain controlled  Skin: Denies abnormal skin rashes Lymphatics: Denies new lymphadenopathy or easy bruising Neurological:Denies numbness, tingling or new weaknesses Behavioral/Psych: Mood is stable, no new changes  All other systems were reviewed with the patient and are negative.  MEDICAL HISTORY:  Past Medical History:  Diagnosis Date  . Family history of lung cancer   . Family history of prostate cancer   . Family history of uterine cancer   . Gallstones 10/2017  . Pancreatic cancer ([place]  . Pneumothorax, closed, traumatic    years ago    SURGICAL  HISTORY: Past Surgical History:  Procedure Laterality Date  . BILIARY STENT PLACEMENT  10/31/2017   Procedure: BILIARY STENT PLACEMENT;  Surgeon: GuJackquline DenmarkMD;  [facility];  Service: Gastroenterology;;  . ERCP N/A 10/31/2017   Procedure: ENDOSCOPIC RETROGRADE CHOLANGIOPANCREATOGRAPHY (ERCP);  Surgeon: GuJackquline DenmarkMD;  [facility];  Service: Gastroenterology;  Laterality: N/A;  . ESOPHAGOGASTRODUODENOSCOPY N/A 11/08/2017   Procedure: ESOPHAGOGASTRODUODENOSCOPY (EGD);  Surgeon: JaMilus BanisterMD;  [facility];  Service: Endoscopy;  Laterality: N/A;  . EUS N/A 11/08/2017   Procedure: UPPER ENDOSCOPIC ULTRASOUND (EUS) RADIAL;  Surgeon: JaMilus BanisterMD;  Location: [facility] ENDOSCOPY;  Service: Endoscopy;  Laterality: N/A;  . FINE NEEDLE ASPIRATION  11/08/2017   Procedure: FINE NEEDLE ASPIRATION;  Surgeon: JaMilus BanisterMD;  Location: [facility] ENDOSCOPY;  Service: Endoscopy;;  . IR IMAGING GUIDED PORT INSERTION  11/15/2017  . SPHINCTEROTOMY  10/31/2017   Procedure: SPHINCTEROTOMY;  Surgeon: GuJackquline DenmarkMD;  [facility];  Service: Gastroenterology;;  . TONSILLECTOMY      I have reviewed the social history and family history with the patient and they are unchanged from previous note.  ALLERGIES:  is allergic to latex.  MEDICATIONS:  Current Outpatient Medications  Medication Sig Dispense Refill  . cyclobenzaprine (FLEXERIL) 5 MG tablet Take 1 tablet (5 mg total) by mouth 3 (three) times daily as needed for muscle spasms. 30 tablet 2  . DULoxetine (CYMBALTA) 20 MG capsule  Take 1 capsule (20 mg total) by mouth 2 (two) times daily. Take once daily for 1 week then increase to BID 60 capsule 3  . lidocaine-prilocaine (EMLA) cream Apply 1 application topically as needed. 30 g 2  . loperamide (IMODIUM) 2 MG capsule Take 2 capsules (4 mg total) by mouth every 6 (six) hours as needed for diarrhea or loose stools. 60 capsule 0  . loratadine  (CLARITIN) 10 MG tablet Take 1 tablet (10 mg total) by mouth daily. Take for 5 days after the Udenyca injection 30 tablet 2  . morphine (MS CONTIN) 15 MG 12 hr tablet Take 1 tablet (15 mg total) by mouth every 12 (twelve) hours. 60 tablet 0  . morphine (MSIR) 15 MG tablet Take 1 tablet (15 mg total) by mouth every 6 (six) hours as needed for severe pain. 120 tablet 0  . ondansetron (ZOFRAN) 8 MG tablet Take 1 tablet (8 mg total) by mouth every 8 (eight) hours as needed for nausea or vomiting. 30 tablet 0  . prochlorperazine (COMPAZINE) 10 MG tablet Take 1 tablet (10 mg total) by mouth every 6 (six) hours as needed for nausea or vomiting. 30 tablet 3  . promethazine (PHENERGAN) 25 MG tablet Take 1 tablet (25 mg total) by mouth every 6 (six) hours as needed for nausea or vomiting. 30 tablet 0   No current facility-administered medications for this visit.     PHYSICAL EXAMINATION: ECOG PERFORMANCE STATUS: 1 - Symptomatic but completely ambulatory  Vitals:   03/06/18 1022  BP: (!) 144/87  Pulse: 84  Resp: 18  Temp: 97.8 F (36.6 C)  SpO2: 100%   Filed Weights   03/06/18 1022  Weight: 124 lb 9.6 oz (56.5 kg)    GENERAL:alert, no distress and comfortable SKIN: skin color, texture, turgor are normal, no rashes or significant lesions EYES: normal, Conjunctiva are pink and non-injected, sclera clear OROPHARYNX:no exudate, no erythema and lips, buccal mucosa, and tongue normal  NECK: supple, thyroid normal size, non-tender, without nodularity LYMPH:  no palpable lymphadenopathy in the cervical, axillary or inguinal LUNGS: clear to auscultation and percussion with normal breathing effort HEART: regular rate & rhythm and no murmurs and no lower extremity edema ABDOMEN:abdomen soft and normal bowel sounds (+) Mild right abdominal tenderness, no hepatomegaly  Musculoskeletal:no cyanosis of digits and no clubbing  NEURO: alert & oriented x 3 with fluent speech, no focal motor/sensory  deficits  LABORATORY DATA:  I have reviewed the data as listed CBC Latest Ref Rng & Units 03/06/2018 02/20/2018 02/05/2018  WBC 4.0 - 10.5 K/uL 5.5 6.8 11.3(H)  Hemoglobin 12.0 - 15.0 g/dL 10.8(L) 10.7(L) 11.7(L)  Hematocrit 36.0 - 46.0 % 34.7(L) 34.0(L) 37.7  Platelets 150 - 400 K/uL 144(L) 200 315     CMP Latest Ref Rng & Units 03/06/2018 02/20/2018 02/05/2018  Glucose 70 - 99 mg/dL 99 87 89  BUN 6 - 20 mg/dL 7 4(L) <4(L)  Creatinine 0.44 - 1.00 mg/dL 0.66 0.63 0.62  Sodium 135 - 145 mmol/L 140 139 139  Potassium 3.5 - 5.1 mmol/L 4.0 4.1 4.1  Chloride 98 - 111 mmol/L 103 103 104  CO2 22 - 32 mmol/L 28 28 25   Calcium 8.9 - 10.3 mg/dL 9.0 9.3 9.2  Total Protein 6.5 - 8.1 g/dL 7.4 7.2 7.5  Total Bilirubin 0.3 - 1.2 mg/dL 0.3 0.3 0.3  Alkaline Phos 38 - 126 U/L 125 261(H) 219(H)  AST 15 - 41 U/L 19 54(H) 24  ALT 0 -  44 U/L 15 55(H) 34      RADIOGRAPHIC STUDIES: I have personally reviewed the radiological images as listed and agreed with the findings in the report. No results found.   ASSESSMENT & PLAN:  Jane Brooks is a 48 y.o. female with   1. 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 She is tolerating reduced dose chemo very well.  -Labs reviewed, CBC WNL except mild anemia  With Hg at 10.8 and PLT at 144K. CMP WNL. Overall adequate to proceed with cycle 8 FOLFIRINOX today.  -I discussed we planned for 8 cycles of treatment before proceeding with surgery. She will consult with Dr. Barry Dienes on 3/3. If her surgery is not set soon, may given another cycle of chemo in 2 weeks.  -I strongly encouraged her to quit smoking completely, which will help her recovery after surgery. -Will do CT CAP in 1-2 weeks, depending on the CT scan findings, will evaluate if she  needs neoadjuvant radiation. -f/u in 2 weeks   2. Abdominal pain, secondary to #1  -Pain very controlled with MS Contin 15 mg every 12 hours, and use morphine 60m PRN, Cymbalta and Flexeril. I refilled MS Contin and Flexeril today (03/06/18) -I discussed if her pain is not significant she does not have to take pain medication as she can develop dependence over long term use. We will start to ween her off narcotics after surgery. -Will hold on pain clinic referral for now, but she agrees to see a pain specialist if she requests much higher dose or longer tern narcotics   3. Social and FAcupuncturist-I previously referred to SW. -She currently stays in a hotel,and will move to apartment, under the help of social service -She now has her puppy and doing well with him.   4. 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 Genetics -She was seen by the gDietitianGenetic test has been held due to lack of insurance.   6. Nausea and weight loss -Continue antiemetics with Compazine, Phenergan and Zofran. -Continue nutritional supplements such as Ensure.  -Much improved and appetite fluctuates.  -She has been able to gain weight recently. Stable.   7. Smoking Cessation  -She is currently down to smoking 3/4 ppd -I strongly encouraged her to continue to reduce and completely quit as going into surgery this can effect her recovery. She understands and agrees.    Plan -I provided her with a letter explaining her cancer treatment plan to give to the court for her probation and community service.  -I refilled her Flexeril and MS Contin (refill on 2/30) today  -Labs reviewed and adequate to proceed with cycle 8 FOLFIRINOX today  -CT abdomen with contrast in 1-2 weeks  -f/u in 2 weeks     No problem-specific Assessment & Plan notes found for this encounter.   Orders Placed  This Encounter  Procedures  . CT Abdomen Pelvis W Contrast    Standing Status:   Future    Standing Expiration Date:   03/06/2019    Order Specific Question:   If indicated for the ordered procedure, I authorize the administration of contrast media per Radiology protocol    Answer:   Yes    Order Specific Question:   Is patient pregnant?  Answer:   No    Order Specific Question:   Preferred imaging location?    Answer:   [facility]    Order Specific Question:   Is Oral Contrast requested for this exam?    Answer:   Yes, Per Radiology protocol    Order Specific Question:   Radiology Contrast Protocol - do NOT remove file path    Answer:   [email or web address]   All questions were answered. The patient knows to call the clinic with any problems, questions or concerns. No barriers to learning was detected. I spent 20 minutes counseling the patient face to face. The total time spent in the appointment was 25 minutes and more than 50% was on counseling and review of test results     Truitt Merle, MD 03/06/2018   I, Joslyn Devon, am acting as scribe for Truitt Merle, MD.   I have reviewed the above documentation for accuracy and completeness, and I agree with the above.

## 2018-03-06 ENCOUNTER — Inpatient Hospital Stay: Payer: Medicaid Other

## 2018-03-06 ENCOUNTER — Encounter: Payer: Self-pay | Admitting: Hematology

## 2018-03-06 ENCOUNTER — Telehealth: Payer: Self-pay | Admitting: Hematology

## 2018-03-06 ENCOUNTER — Inpatient Hospital Stay (HOSPITAL_BASED_OUTPATIENT_CLINIC_OR_DEPARTMENT_OTHER): Payer: Medicaid Other | Admitting: Hematology

## 2018-03-06 VITALS — BP 144/87 | HR 84 | Temp 97.8°F | Resp 18 | Ht 59.0 in | Wt 124.6 lb

## 2018-03-06 DIAGNOSIS — R109 Unspecified abdominal pain: Secondary | ICD-10-CM

## 2018-03-06 DIAGNOSIS — Z8042 Family history of malignant neoplasm of prostate: Secondary | ICD-10-CM

## 2018-03-06 DIAGNOSIS — R918 Other nonspecific abnormal finding of lung field: Secondary | ICD-10-CM

## 2018-03-06 DIAGNOSIS — D563 Thalassemia minor: Secondary | ICD-10-CM

## 2018-03-06 DIAGNOSIS — D509 Iron deficiency anemia, unspecified: Secondary | ICD-10-CM

## 2018-03-06 DIAGNOSIS — D6481 Anemia due to antineoplastic chemotherapy: Secondary | ICD-10-CM | POA: Diagnosis not present

## 2018-03-06 DIAGNOSIS — R1013 Epigastric pain: Secondary | ICD-10-CM

## 2018-03-06 DIAGNOSIS — Z5111 Encounter for antineoplastic chemotherapy: Secondary | ICD-10-CM | POA: Diagnosis not present

## 2018-03-06 DIAGNOSIS — Z801 Family history of malignant neoplasm of trachea, bronchus and lung: Secondary | ICD-10-CM

## 2018-03-06 DIAGNOSIS — C250 Malignant neoplasm of head of pancreas: Secondary | ICD-10-CM

## 2018-03-06 DIAGNOSIS — Z79899 Other long term (current) drug therapy: Secondary | ICD-10-CM

## 2018-03-06 DIAGNOSIS — R634 Abnormal weight loss: Secondary | ICD-10-CM

## 2018-03-06 DIAGNOSIS — Z808 Family history of malignant neoplasm of other organs or systems: Secondary | ICD-10-CM

## 2018-03-06 DIAGNOSIS — R110 Nausea: Secondary | ICD-10-CM

## 2018-03-06 DIAGNOSIS — T451X5A Adverse effect of antineoplastic and immunosuppressive drugs, initial encounter: Secondary | ICD-10-CM

## 2018-03-06 DIAGNOSIS — I700 Atherosclerosis of aorta: Secondary | ICD-10-CM

## 2018-03-06 DIAGNOSIS — F17210 Nicotine dependence, cigarettes, uncomplicated: Secondary | ICD-10-CM

## 2018-03-06 LAB — CBC WITH DIFFERENTIAL (CANCER CENTER ONLY)
Abs Immature Granulocytes: 0.01 K/uL (ref 0.00–0.07)
Basophils Absolute: 0 K/uL (ref 0.0–0.1)
Basophils Relative: 0 %
Eosinophils Absolute: 0.2 K/uL (ref 0.0–0.5)
Eosinophils Relative: 3 %
HCT: 34.7 % — ABNORMAL LOW (ref 36.0–46.0)
Hemoglobin: 10.8 g/dL — ABNORMAL LOW (ref 12.0–15.0)
Immature Granulocytes: 0 %
Lymphocytes Relative: 36 %
Lymphs Abs: 2 K/uL (ref 0.7–4.0)
MCH: 20.3 pg — ABNORMAL LOW (ref 26.0–34.0)
MCHC: 31.1 g/dL (ref 30.0–36.0)
MCV: 65.3 fL — ABNORMAL LOW (ref 80.0–100.0)
Monocytes Absolute: 0.6 K/uL (ref 0.1–1.0)
Monocytes Relative: 11 %
Neutro Abs: 2.7 K/uL (ref 1.7–7.7)
Neutrophils Relative %: 50 %
Platelet Count: 144 K/uL — ABNORMAL LOW (ref 150–400)
RBC: 5.31 MIL/uL — ABNORMAL HIGH (ref 3.87–5.11)
RDW: 19 % — ABNORMAL HIGH (ref 11.5–15.5)
WBC Count: 5.5 K/uL (ref 4.0–10.5)
nRBC: 0 % (ref 0.0–0.2)

## 2018-03-06 LAB — CMP (CANCER CENTER ONLY)
ALT: 15 U/L (ref 0–44)
AST: 19 U/L (ref 15–41)
Albumin: 3.8 g/dL (ref 3.5–5.0)
Alkaline Phosphatase: 125 U/L (ref 38–126)
Anion gap: 9 (ref 5–15)
BUN: 7 mg/dL (ref 6–20)
CO2: 28 mmol/L (ref 22–32)
Calcium: 9 mg/dL (ref 8.9–10.3)
Chloride: 103 mmol/L (ref 98–111)
Creatinine: 0.66 mg/dL (ref 0.44–1.00)
GFR, Est AFR Am: >60 mL/min (ref >60)
GFR, Estimated: >60 mL/min (ref >60)
Glucose, Bld: 99 mg/dL (ref 70–99)
Potassium: 4 mmol/L (ref 3.5–5.1)
Sodium: 140 mmol/L (ref 135–145)
Total Bilirubin: 0.3 mg/dL (ref 0.3–1.2)
Total Protein: 7.4 g/dL (ref 6.5–8.1)

## 2018-03-06 MED ORDER — MORPHINE SULFATE ER 15 MG PO TBCR: 15.0000 mg | EXTENDED_RELEASE_TABLET | Freq: Two times a day (BID) | ORAL | 0 refills | Status: DC

## 2018-03-06 MED ORDER — CYCLOBENZAPRINE HCL 5 MG PO TABS: 5.0000 mg | ORAL_TABLET | Freq: Three times a day (TID) | ORAL | 2 refills | Status: DC | PRN

## 2018-03-06 MED ORDER — SODIUM CHLORIDE 0.9% FLUSH
10.0000 mL | INTRAVENOUS | Status: DC | PRN
  Administered 2018-03-06: 10 mL
  Filled 2018-03-06: qty 10

## 2018-03-06 MED FILL — CYCLOBENZAPRINE HCL 5 MG TA: 5 | 10 days supply | Qty: 30 | Fill #0

## 2018-03-06 NOTE — Telephone Encounter (Signed)
Called patient and left a VM with the number to central radiology to call and schedule their CT scan.

## 2018-03-07 ENCOUNTER — Inpatient Hospital Stay: Payer: Medicaid Other

## 2018-03-07 VITALS — BP 113/71 | HR 86 | Temp 99.1°F | Resp 18

## 2018-03-07 DIAGNOSIS — Z5111 Encounter for antineoplastic chemotherapy: Secondary | ICD-10-CM | POA: Diagnosis not present

## 2018-03-07 DIAGNOSIS — C250 Malignant neoplasm of head of pancreas: Secondary | ICD-10-CM

## 2018-03-07 MED ORDER — ATROPINE SULFATE 1 MG/ML IJ SOLN
INTRAMUSCULAR | Status: AC
  Filled 2018-03-07: qty 1

## 2018-03-07 MED ORDER — DEXTROSE 5 % IV SOLN
Freq: Once | INTRAVENOUS | Status: AC
  Administered 2018-03-07: 09:00:00 via INTRAVENOUS
  Filled 2018-03-07: qty 250

## 2018-03-07 MED ORDER — LEUCOVORIN CALCIUM INJECTION 350 MG
320.0000 mg/m2 | Freq: Once | INTRAVENOUS | Status: AC
  Administered 2018-03-07: 500 mg via INTRAVENOUS
  Filled 2018-03-07: qty 25

## 2018-03-07 MED ORDER — ATROPINE SULFATE 0.4 MG/ML IJ SOLN
INTRAMUSCULAR | Status: AC
  Filled 2018-03-07: qty 1

## 2018-03-07 MED ORDER — DEXAMETHASONE SODIUM PHOSPHATE 10 MG/ML IJ SOLN
INTRAMUSCULAR | Status: AC
  Filled 2018-03-07: qty 1

## 2018-03-07 MED ORDER — ATROPINE SULFATE 1 MG/ML IJ SOLN
0.5000 mg | Freq: Once | INTRAMUSCULAR | Status: AC | PRN
  Administered 2018-03-07: 0.5 mg via INTRAVENOUS

## 2018-03-07 MED ORDER — OXALIPLATIN CHEMO INJECTION 100 MG/20ML
65.0000 mg/m2 | Freq: Once | INTRAVENOUS | Status: AC
  Administered 2018-03-07: 100 mg via INTRAVENOUS
  Filled 2018-03-07: qty 20

## 2018-03-07 MED ORDER — SODIUM CHLORIDE 0.9 % IV SOLN
2000.0000 mg/m2 | INTRAVENOUS | Status: DC
  Administered 2018-03-07: 3100 mg via INTRAVENOUS
  Filled 2018-03-07: qty 62

## 2018-03-07 MED ORDER — PALONOSETRON HCL INJECTION 0.25 MG/5ML
0.2500 mg | Freq: Once | INTRAVENOUS | Status: AC
  Administered 2018-03-07: 0.25 mg via INTRAVENOUS

## 2018-03-07 MED ORDER — IRINOTECAN HCL CHEMO INJECTION 100 MG/5ML
120.0000 mg/m2 | Freq: Once | INTRAVENOUS | Status: AC
  Administered 2018-03-07: 180 mg via INTRAVENOUS
  Filled 2018-03-07: qty 9

## 2018-03-07 MED ORDER — DEXAMETHASONE SODIUM PHOSPHATE 10 MG/ML IJ SOLN
10.0000 mg | Freq: Once | INTRAMUSCULAR | Status: AC
  Administered 2018-03-07: 10 mg via INTRAVENOUS

## 2018-03-07 MED ORDER — PALONOSETRON HCL INJECTION 0.25 MG/5ML
INTRAVENOUS | Status: AC
  Filled 2018-03-07: qty 5

## 2018-03-07 NOTE — Patient Instructions (Signed)
[facility] [facility] Discharge Instructions for Patients Receiving Chemotherapy  Today you received the following chemotherapy agents Oxaliplatin, Leucovorin, Irinotecan and 5FU  To help prevent nausea and vomiting after your treatment, we encourage you to take your nausea medication as directed If you develop nausea and vomiting that is not controlled by your nausea medication, call the clinic.   BELOW ARE SYMPTOMS THAT SHOULD BE REPORTED IMMEDIATELY:  *FEVER GREATER THAN 100.5 F  *CHILLS WITH OR WITHOUT FEVER  NAUSEA AND VOMITING THAT IS NOT CONTROLLED WITH YOUR NAUSEA MEDICATION  *UNUSUAL SHORTNESS OF BREATH  *UNUSUAL BRUISING OR BLEEDING  TENDERNESS IN MOUTH AND THROAT WITH OR WITHOUT PRESENCE OF ULCERS  *URINARY PROBLEMS  *BOWEL PROBLEMS  UNUSUAL RASH Items with * indicate a potential emergency and should be followed up as soon as possible.  Feel free to call the clinic should you have any questions or concerns. The clinic phone number is [phone].  Please show the [place] at check-in to the Emergency Department and triage nurse.

## 2018-03-08 ENCOUNTER — Inpatient Hospital Stay: Payer: Medicaid Other

## 2018-03-09 ENCOUNTER — Inpatient Hospital Stay: Payer: Medicaid Other

## 2018-03-09 VITALS — BP 100/75 | HR 80 | Temp 98.5°F | Resp 18

## 2018-03-09 DIAGNOSIS — C250 Malignant neoplasm of head of pancreas: Secondary | ICD-10-CM

## 2018-03-09 DIAGNOSIS — Z5111 Encounter for antineoplastic chemotherapy: Secondary | ICD-10-CM | POA: Diagnosis not present

## 2018-03-09 MED ORDER — HEPARIN SOD (PORK) LOCK FLUSH 100 UNIT/ML IV SOLN
500.0000 [IU] | Freq: Once | INTRAVENOUS | Status: AC | PRN
  Administered 2018-03-09: 500 [IU]
  Filled 2018-03-09: qty 5

## 2018-03-09 MED ORDER — SODIUM CHLORIDE 0.9% FLUSH
10.0000 mL | INTRAVENOUS | Status: DC | PRN
  Administered 2018-03-09: 10 mL
  Filled 2018-03-09: qty 10

## 2018-03-13 ENCOUNTER — Encounter (HOSPITAL_COMMUNITY): Payer: Self-pay | Admitting: *Deleted

## 2018-03-13 ENCOUNTER — Emergency Department (HOSPITAL_COMMUNITY)
Admission: EM | Admit: 2018-03-13 | Discharge: 2018-03-13 | Disposition: A | Discharge status: Home / Self Care | Payer: Medicaid Other | Attending: Emergency Medicine | Admitting: Emergency Medicine

## 2018-03-13 DIAGNOSIS — Z5321 Procedure and treatment not carried out due to patient leaving prior to being seen by health care provider: Secondary | ICD-10-CM | POA: Diagnosis not present

## 2018-03-13 DIAGNOSIS — R109 Unspecified abdominal pain: Secondary | ICD-10-CM | POA: Insufficient documentation

## 2018-03-13 MED ORDER — SODIUM CHLORIDE 0.9% FLUSH: 3.0000 mL | Freq: Once | INTRAVENOUS | Status: DC

## 2018-03-13 NOTE — ED Triage Notes (Signed)
Pt leaving due to wait, encouraged to stay

## 2018-03-13 NOTE — ED Triage Notes (Signed)
Pt in c/o generalized abd pain with n/v for the last two days, history of pancreatic cancer and has chronic pain but states this feels different

## 2018-03-15 ENCOUNTER — Other Ambulatory Visit: Payer: Self-pay

## 2018-03-15 NOTE — Progress Notes (Signed)
GI Tumor Board 02/13/18 Recommendation:    Has completed 5 cycles/2 months of chemotherapy. Will complete 4 months of chemo and then PET scan. Will evauluate for role of RXT.

## 2018-03-18 ENCOUNTER — Other Ambulatory Visit: Payer: Self-pay | Admitting: General Surgery

## 2018-03-18 ENCOUNTER — Ambulatory Visit (HOSPITAL_COMMUNITY): Payer: Medicaid Other

## 2018-03-18 MED FILL — MORPHINE SULF ER 15 MG TAB: 15 | 30 days supply | Qty: 60 | Fill #0

## 2018-03-18 NOTE — Progress Notes (Signed)
[place]   [phone] [phone]   Clinic Follow up Note   Patient Care Team: Patient, No Pcp Per as PCP - General ([place]) Milus Banister, MD as Attending Physician (Gastroenterology) Truitt Merle, MD as Consulting Physician (Hematology)  Date of Service:  03/20/2018  CHIEF COMPLAINT: F/u on pancreatic cancer  SUMMARY OF ONCOLOGIC HISTORY: Oncology History   Cancer Staging Pancreatic cancer [facility]) Staging form: Exocrine Pancreas, AJCC 8th Edition - Clinical stage from 11/08/2017: Stage IB (cT2, cN0, cM0) - Signed by Truitt Merle, MD on 11/09/2017       Pancreatic cancer ([place])   10/30/2017 Imaging    MRI Abdomen 10/30/17  IMPRESSION: 1. Marked intra and extrahepatic biliary duct dilatation with abrupt cut off of the common bile duct at the pancreatic head. This is associated with marked dilatation of the main pancreatic duct also with an abrupt cut off at the level of the pancreatic head. Pancreatic parenchyma in the body and tail of pancreas is markedly atrophic. A subtle 2.5 x 3 cm lesion with differential signal intensity is identified in the head of the pancreas which appears slightly hypoenhancing on postcontrast imaging. Together, these features are highly concerning for pancreatic adenocarcinoma. EUS is recommended to further evaluate. 2. Portal vein is patent although there is some mass-effect on the portal splenic confluence and IVC. Fat planes around celiac axis and SMA appear preserved. 3. Upper normal to borderline portal caval lymph node associated with small right para-aortic lymphadenopathy.      10/30/2017 Tumor Marker    Baseline Ca 19-9 at 140    10/31/2017 Procedure    ERCP by Dr. Lyndel Safe 10/31/17  IMPRESSION Malignant appearing CBD stricture (due to pancreatic mass) status post sphincterotomy and stent insertion.    11/08/2017 Cancer Staging    Staging form: Exocrine Pancreas, AJCC 8th Edition -  Clinical stage from 11/08/2017: Stage IB (cT2, cN0, cM0) - Signed by Truitt Merle, MD on 11/09/2017    11/08/2017 Procedure    EUS and EGD by Dr. Ardis Hughs 11/08/17  IMPRESSION -Very vague, irregularly bordered, approximately 2.6cm mass was found in the head of pancreas. This causes biliary and pancreatic duct obstruction. The previously plastic biliary stent is in good position. The mass directly abuts and attenuates the PV/SMV confluence, suggesting invasion. FNA performed and the preliminary cytology review is positive for maligancy (adenocarcinoma). - [facility] office will arrange referrals to medical and surgical oncology. - Case to be discussed at upcoming multidisciplinary GI tumor conference.    11/08/2017 Initial Biopsy    Biopsy Cytology 11/08/17 Diagnosis FINE NEEDLE ASPIRATION, ENDOSCOPIC, PANCREAS HEAD (SPECIMEN 1 OF 1 COLLECTED 11/08/17): MALIGNANT CELLS CONSISTENT WITH ADENOCARCINOMA.    11/09/2017 Initial Diagnosis    Pancreatic cancer ([place])    11/15/2017 Imaging    CT Chest and Pelvis WO Contrast 11/15/17 IMPRESSION: 1. No findings of active malignancy in the chest or pelvis. 2. Aortic Atherosclerosis (ICD10-I70.0). Coronary atherosclerosis. 3. Faint centrilobular nodularity in the lung apices, raising the possibility of hypersensitivity pneumonitis or less likely respiratory bronchiolitis. Airway thickening is present, suggesting bronchitis or reactive airways disease.    11/19/2017 - 03/07/2018 Chemotherapy    FOLFIRINOX q2weeks with Neulasta injection for 4 months starting 11/19/17. Completed 8 cycles on 03/07/18     02/04/2018 Imaging    CT AP W Contrast 02/04/18  IMPRESSION: 1. No substantial change in size of mass involving head of pancreas. A aortocaval lymph node which appears increased in the interval measuring 1.1  cm versus 0.8 cm previously. Stable 1.2 cm peripancreatic node. 2. Dilated proximal common bile duct is mildly increased in caliber compared with  12/12/2017. The common bile duct stent remains in place in appears to be in appropriate position.       CURRENT THERAPY:  PENDING Surgery on 04/19/18    INTERVAL HISTORY:  Jane Brooks is here for a follow up and treatment. She presents to the clinic today by herself. She notes she is doing well. She notes she was scheduled for her CT scan and showed up but was canceled because it was not approved. She saw Dr. Barry Dienes that day as well and surgery is set for 04/25/18. She is not sure if it is approved yet. She prefers not to proceed with chemo anymore before surgery. I reviewed medication list with her. She would like flexeril, short acting Morphine and Cymbalta refilled.  She now lives in a new apartment complex.     REVIEW OF SYSTEMS:   Constitutional: Denies fevers, chills or abnormal weight loss Eyes: Denies blurriness of vision Ears, nose, mouth, throat, and face: Denies mucositis or sore throat Respiratory: Denies cough, dyspnea or wheezes Cardiovascular: Denies palpitation, chest discomfort or lower extremity swelling Gastrointestinal:  Denies nausea, heartburn or change in bowel habits Skin: Denies abnormal skin rashes Lymphatics: Denies new lymphadenopathy or easy bruising Neurological:Denies numbness, tingling or new weaknesses Behavioral/Psych: Mood is stable, no new changes  All other systems were reviewed with the patient and are negative.  MEDICAL HISTORY:  Past Medical History:  Diagnosis Date  . Family history of lung cancer   . Family history of prostate cancer   . Family history of uterine cancer   . Gallstones 10/2017  . Pancreatic cancer ([place])   . Pneumothorax, closed, traumatic    years ago    SURGICAL HISTORY: Past Surgical History:  Procedure Laterality Date  . BILIARY STENT PLACEMENT  10/31/2017   Procedure: BILIARY STENT PLACEMENT;  Surgeon: [name], MD;  Location: [facility] ENDOSCOPY;  Service: Gastroenterology;;  . ERCP N/A 10/31/2017    Procedure: ENDOSCOPIC RETROGRADE CHOLANGIOPANCREATOGRAPHY (ERCP);  Surgeon: [name], MD;  Location: [facility] ENDOSCOPY;  Service: Gastroenterology;  Laterality: N/A;  . ESOPHAGOGASTRODUODENOSCOPY N/A 11/08/2017   Procedure: ESOPHAGOGASTRODUODENOSCOPY (EGD);  Surgeon: Milus Banister, MD;  Location: [facility] ENDOSCOPY;  Service: Endoscopy;  Laterality: N/A;  . EUS N/A 11/08/2017   Procedure: UPPER ENDOSCOPIC ULTRASOUND (EUS) RADIAL;  Surgeon: Milus Banister, MD;  Location: [facility] ENDOSCOPY;  Service: Endoscopy;  Laterality: N/A;  . FINE NEEDLE ASPIRATION  11/08/2017   Procedure: FINE NEEDLE ASPIRATION;  Surgeon: Milus Banister, MD;  Location: [facility] ENDOSCOPY;  Service: Endoscopy;;  . IR IMAGING GUIDED PORT INSERTION  11/15/2017  . SPHINCTEROTOMY  10/31/2017   Procedure: SPHINCTEROTOMY;  Surgeon: [name], MD;  Location: [facility] ENDOSCOPY;  Service: Gastroenterology;;  . TONSILLECTOMY      I have reviewed the social history and family history with the patient and they are unchanged from previous note.  ALLERGIES:  is allergic to latex.  MEDICATIONS:  Current Outpatient Medications  Medication Sig Dispense Refill  . cyclobenzaprine (FLEXERIL) 5 MG tablet Take 1 tablet (5 mg total) by mouth 3 (three) times daily as needed for muscle spasms. 30 tablet 2  . DULoxetine (CYMBALTA) 20 MG capsule Take 1 capsule (20 mg total) by mouth 2 (two) times daily. Take once daily for 1 week then increase to BID 60 capsule 3  . lidocaine-prilocaine (EMLA) cream Apply 1 application topically as  needed. 30 g 2  . loperamide (IMODIUM) 2 MG capsule Take 2 capsules (4 mg total) by mouth every 6 (six) hours as needed for diarrhea or loose stools. 60 capsule 0  . loratadine (CLARITIN) 10 MG tablet Take 1 tablet (10 mg total) by mouth daily. Take for 5 days after the Udenyca injection 30 tablet 2  . morphine (MS CONTIN) 15 MG 12 hr tablet Take 1 tablet (15 mg total) by mouth every 12 (twelve) hours. 60 tablet 0  . morphine  (MSIR) 15 MG tablet Take 1 tablet (15 mg total) by mouth every 6 (six) hours as needed for severe pain. 120 tablet 0  . ondansetron (ZOFRAN) 8 MG tablet Take 1 tablet (8 mg total) by mouth every 8 (eight) hours as needed for nausea or vomiting. (Patient not taking: Reported on 03/20/2018) 30 tablet 0  . prochlorperazine (COMPAZINE) 10 MG tablet Take 1 tablet (10 mg total) by mouth every 6 (six) hours as needed for nausea or vomiting. (Patient not taking: Reported on 03/20/2018) 30 tablet 3  . promethazine (PHENERGAN) 25 MG tablet Take 1 tablet (25 mg total) by mouth every 6 (six) hours as needed for nausea or vomiting. (Patient not taking: Reported on 03/20/2018) 30 tablet 0   No current facility-administered medications for this visit.     PHYSICAL EXAMINATION: ECOG PERFORMANCE STATUS: 1 - Symptomatic but completely ambulatory  Vitals:   03/20/18 1023  BP: 135/78  Pulse: 85  Resp: 18  Temp: 97.7 F (36.5 C)  SpO2: 99%   Filed Weights   03/20/18 1023  Weight: 125 lb 3.2 oz (56.8 kg)    GENERAL:alert, no distress and comfortable SKIN: skin color, texture, turgor are normal, no rashes or significant lesions EYES: normal, Conjunctiva are pink and non-injected, sclera clear OROPHARYNX:no exudate, no erythema and lips, buccal mucosa, and tongue normal  NECK: supple, thyroid normal size, non-tender, without nodularity LYMPH:  no palpable lymphadenopathy in the cervical, axillary or inguinal LUNGS: clear to auscultation and percussion with normal breathing effort HEART: regular rate & rhythm and no murmurs and no lower extremity edema ABDOMEN:abdomen soft, non-tender and normal bowel sounds Musculoskeletal:no cyanosis of digits and no clubbing  NEURO: alert & oriented x 3 with fluent speech, no focal motor/sensory deficits  LABORATORY DATA:  I have reviewed the data as listed CBC Latest Ref Rng & Units 03/20/2018 03/06/2018 02/20/2018  WBC 4.0 - 10.5 K/uL 5.8 5.5 6.8  Hemoglobin 12.0 - 15.0  g/dL 10.4(L) 10.8(L) 10.7(L)  Hematocrit 36.0 - 46.0 % 33.4(L) 34.7(L) 34.0(L)  Platelets 150 - 400 K/uL 165 144(L) 200     CMP Latest Ref Rng & Units 03/20/2018 03/06/2018 02/20/2018  Glucose 70 - 99 mg/dL 115(H) 99 87  BUN 6 - 20 mg/dL 7 7 4(L)  Creatinine 0.44 - 1.00 mg/dL 0.66 0.66 0.63  Sodium 135 - 145 mmol/L 138 140 139  Potassium 3.5 - 5.1 mmol/L 3.8 4.0 4.1  Chloride 98 - 111 mmol/L 103 103 103  CO2 22 - 32 mmol/L 26 28 28   Calcium 8.9 - 10.3 mg/dL 8.7(L) 9.0 9.3  Total Protein 6.5 - 8.1 g/dL 7.2 7.4 7.2  Total Bilirubin 0.3 - 1.2 mg/dL 0.3 0.3 0.3  Alkaline Phos 38 - 126 U/L 101 125 261(H)  AST 15 - 41 U/L 31 19 54(H)  ALT 0 - 44 U/L 25 15 55(H)      RADIOGRAPHIC STUDIES: I have personally reviewed the radiological images as listed and agreed with the  findings in the report. No results found.   ASSESSMENT & PLAN:  Julyanna Scholle is a 48 y.o. female with   1. 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 -She has seen Dr. Barry Dienes who has set Whipple surgery for 04/25/18. She has completed 8 cycles, will not proceed with more neoadjuvant chemo.  -Her CT CAP was not approved so she missed it. Will reschedule for 2 weeks.  -Labs reviewed, CBC WNL except mild anemia, CMP WNL. Ca 19.9 is still pending.  -F/u in 2-3 weeks with CT scan   2. Abdominal pain, secondary to #1 -Pain very controlled with MS Contin 15 mg every 12 hours, and use morphine61mPRN, Cymbalta and Flexeril. I refilled MS Contin and Flexeril today (03/06/18)  -I discussed if her pain is not significant she does not have to take pain medication as she can develop dependence over long term use. -Will hold on pain clinic referral  for now, but she agrees to see a pain specialist if she requests much higher dose or longer tern narcotics -Continue current pain regimen. I refilled short acting Morphine, flexeril and Cymbalta today (03/20/18) -I discussed after surgery we will start to ween her off narcotics. She understands.   3. Social and FAcupuncturist-I previously referred to SW. -She has moved into her new apartment.  -She now has her puppy and doing well with him.  4. 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 Genetics -She was seen by the gDietitianGenetic testhasbeen held due to lack of insurance.  6. Nausea and weight loss -Continue antiemetics withCompazine,PhenerganandZofran. -Continuenutritional supplements such as Ensure.  -Much improved and appetite fluctuates.  -She has been able to gain weight recently.Stable.   7. Smoking Cessation  -She is currently down to smoking 3/4 ppd -I strongly encouraged her to continue to reduce and completely quit as going into surgery this can effect her recovery. She understands and agrees.    Plan -I refilled her flexeril, short acting Morphine and Cymbalta today   -F/u in 2-3 weeks with lab, flush and CT scan (ordered last time)  -Proceed with Surgery on 04/25/18    No problem-specific Assessment & Plan notes found for this encounter.   No orders of the defined types were placed in this encounter.  All questions were answered. The patient knows to call the clinic with any problems, questions or concerns. No barriers to learning was detected. I spent 15 minutes counseling the patient face to face. The total time spent in the appointment was 20 minutes and more than 50% was on counseling and review of test results     YTruitt Merle MD 03/20/2018   I, AJoslyn Devon am acting as scribe for YTruitt Merle MD.   I have reviewed the  above documentation for accuracy and completeness, and I agree with the above.

## 2018-03-20 ENCOUNTER — Inpatient Hospital Stay: Payer: Medicaid Other

## 2018-03-20 ENCOUNTER — Other Ambulatory Visit: Payer: Self-pay

## 2018-03-20 ENCOUNTER — Encounter: Payer: Self-pay | Admitting: Hematology

## 2018-03-20 ENCOUNTER — Inpatient Hospital Stay (HOSPITAL_BASED_OUTPATIENT_CLINIC_OR_DEPARTMENT_OTHER): Payer: Medicaid Other | Admitting: Hematology

## 2018-03-20 ENCOUNTER — Inpatient Hospital Stay: Payer: Medicaid Other | Attending: Hematology

## 2018-03-20 VITALS — BP 135/78 | HR 85 | Temp 97.7°F | Resp 18 | Ht 59.0 in | Wt 125.2 lb

## 2018-03-20 DIAGNOSIS — Z800 Family history of malignant neoplasm of digestive organs: Secondary | ICD-10-CM | POA: Diagnosis not present

## 2018-03-20 DIAGNOSIS — G893 Neoplasm related pain (acute) (chronic): Secondary | ICD-10-CM | POA: Diagnosis not present

## 2018-03-20 DIAGNOSIS — C250 Malignant neoplasm of head of pancreas: Secondary | ICD-10-CM | POA: Insufficient documentation

## 2018-03-20 DIAGNOSIS — Z8042 Family history of malignant neoplasm of prostate: Secondary | ICD-10-CM | POA: Diagnosis not present

## 2018-03-20 DIAGNOSIS — D509 Iron deficiency anemia, unspecified: Secondary | ICD-10-CM | POA: Diagnosis not present

## 2018-03-20 DIAGNOSIS — R110 Nausea: Secondary | ICD-10-CM | POA: Diagnosis not present

## 2018-03-20 DIAGNOSIS — Z801 Family history of malignant neoplasm of trachea, bronchus and lung: Secondary | ICD-10-CM | POA: Diagnosis not present

## 2018-03-20 DIAGNOSIS — D563 Thalassemia minor: Secondary | ICD-10-CM | POA: Diagnosis not present

## 2018-03-20 DIAGNOSIS — D6481 Anemia due to antineoplastic chemotherapy: Secondary | ICD-10-CM | POA: Insufficient documentation

## 2018-03-20 DIAGNOSIS — R918 Other nonspecific abnormal finding of lung field: Secondary | ICD-10-CM | POA: Insufficient documentation

## 2018-03-20 DIAGNOSIS — I700 Atherosclerosis of aorta: Secondary | ICD-10-CM | POA: Insufficient documentation

## 2018-03-20 DIAGNOSIS — Z9221 Personal history of antineoplastic chemotherapy: Secondary | ICD-10-CM | POA: Insufficient documentation

## 2018-03-20 DIAGNOSIS — F17210 Nicotine dependence, cigarettes, uncomplicated: Secondary | ICD-10-CM | POA: Diagnosis not present

## 2018-03-20 DIAGNOSIS — R634 Abnormal weight loss: Secondary | ICD-10-CM

## 2018-03-20 DIAGNOSIS — R1013 Epigastric pain: Secondary | ICD-10-CM

## 2018-03-20 DIAGNOSIS — Z79899 Other long term (current) drug therapy: Secondary | ICD-10-CM | POA: Insufficient documentation

## 2018-03-20 LAB — CBC WITH DIFFERENTIAL (CANCER CENTER ONLY)
Abs Immature Granulocytes: 0.01 10*3/uL (ref 0.00–0.07)
BASOS ABS: 0 10*3/uL (ref 0.0–0.1)
Basophils Relative: 0 %
Eosinophils Absolute: 0.2 10*3/uL (ref 0.0–0.5)
Eosinophils Relative: 3 %
HCT: 33.4 % — ABNORMAL LOW (ref 36.0–46.0)
HEMOGLOBIN: 10.4 g/dL — AB (ref 12.0–15.0)
Immature Granulocytes: 0 %
Lymphocytes Relative: 37 %
Lymphs Abs: 2.1 10*3/uL (ref 0.7–4.0)
MCH: 20.4 pg — ABNORMAL LOW (ref 26.0–34.0)
MCHC: 31.1 g/dL (ref 30.0–36.0)
MCV: 65.5 fL — ABNORMAL LOW (ref 80.0–100.0)
Monocytes Absolute: 0.4 10*3/uL (ref 0.1–1.0)
Monocytes Relative: 7 %
NEUTROS ABS: 3.1 10*3/uL (ref 1.7–7.7)
Neutrophils Relative %: 53 %
Platelet Count: 165 10*3/uL (ref 150–400)
RBC: 5.1 MIL/uL (ref 3.87–5.11)
RDW: 18.6 % — ABNORMAL HIGH (ref 11.5–15.5)
WBC Count: 5.8 10*3/uL (ref 4.0–10.5)
nRBC: 0 % (ref 0.0–0.2)

## 2018-03-20 LAB — CMP (CANCER CENTER ONLY)
ALT: 25 U/L (ref 0–44)
AST: 31 U/L (ref 15–41)
Albumin: 3.7 g/dL (ref 3.5–5.0)
Alkaline Phosphatase: 101 U/L (ref 38–126)
Anion gap: 9 (ref 5–15)
BUN: 7 mg/dL (ref 6–20)
CO2: 26 mmol/L (ref 22–32)
Calcium: 8.7 mg/dL — ABNORMAL LOW (ref 8.9–10.3)
Chloride: 103 mmol/L (ref 98–111)
Creatinine: 0.66 mg/dL (ref 0.44–1.00)
GFR, Est AFR Am: >60 mL/min (ref >60)
GFR, Estimated: >60 mL/min (ref >60)
Glucose, Bld: 115 mg/dL — ABNORMAL HIGH (ref 70–99)
POTASSIUM: 3.8 mmol/L (ref 3.5–5.1)
Sodium: 138 mmol/L (ref 135–145)
Total Bilirubin: 0.3 mg/dL (ref 0.3–1.2)
Total Protein: 7.2 g/dL (ref 6.5–8.1)

## 2018-03-20 MED ORDER — SODIUM CHLORIDE 0.9% FLUSH
10.0000 mL | INTRAVENOUS | Status: DC | PRN
  Administered 2018-03-20: 10 mL
  Filled 2018-03-20: qty 10

## 2018-03-20 MED ORDER — DULOXETINE HCL 20 MG PO CPEP: 20.0000 mg | ORAL_CAPSULE | Freq: Two times a day (BID) | ORAL | 3 refills | Status: DC

## 2018-03-20 MED ORDER — CYCLOBENZAPRINE HCL 5 MG PO TABS: 5.0000 mg | ORAL_TABLET | Freq: Three times a day (TID) | ORAL | 2 refills | Status: DC | PRN

## 2018-03-20 MED ORDER — MORPHINE SULFATE 15 MG PO TABS: 15.0000 mg | ORAL_TABLET | Freq: Four times a day (QID) | ORAL | 0 refills | Status: DC | PRN

## 2018-03-20 MED FILL — CYCLOBENZAPRINE HCL 5 MG TA: 5 | 10 days supply | Qty: 30 | Fill #0

## 2018-03-20 MED FILL — DULOXETINE HCL 20 MG CPEP: 20 | 33 days supply | Qty: 60 | Fill #0

## 2018-03-20 MED FILL — MORPHINE SULFATE IR 15 MG T: 15 | 30 days supply | Qty: 120 | Fill #0

## 2018-03-21 LAB — CANCER ANTIGEN 19-9: CA 19-9: 61 U/mL — ABNORMAL HIGH (ref 0–35)

## 2018-03-22 ENCOUNTER — Inpatient Hospital Stay: Payer: Medicaid Other

## 2018-03-22 ENCOUNTER — Telehealth: Payer: Self-pay | Admitting: Hematology

## 2018-03-22 NOTE — Telephone Encounter (Signed)
Scheduled appt 3/4 los.  Patient CT appt will be scheduled by the office once it is approved, I called and they couldn't give me much info.  Patient not aware of scheduled appt she did not answer and was not able to leave a VM.  Printed calendar and mailed it.

## 2018-04-03 ENCOUNTER — Other Ambulatory Visit: Payer: Self-pay

## 2018-04-03 NOTE — Progress Notes (Signed)
[name]   [phone] [phone]   Clinic Follow up Note   Patient Care Team: Patient, No Pcp Per as PCP - General ([place]) Milus Banister, MD as Attending Physician (Gastroenterology) Truitt Merle, MD as Consulting Physician (Hematology)  Date of Service:  04/05/2018  CHIEF COMPLAINT: F/u on pancreatic cancer  SUMMARY OF ONCOLOGIC HISTORY: Oncology History   Cancer Staging Pancreatic cancer [facility]) Staging form: Exocrine Pancreas, AJCC 8th Edition - Clinical stage from 11/08/2017: Stage IB (cT2, cN0, cM0) - Signed by Truitt Merle, MD on 11/09/2017       Pancreatic cancer ([place])   10/30/2017 Imaging    MRI Abdomen 10/30/17  IMPRESSION: 1. Marked intra and extrahepatic biliary duct dilatation with abrupt cut off of the common bile duct at the pancreatic head. This is associated with marked dilatation of the main pancreatic duct also with an abrupt cut off at the level of the pancreatic head. Pancreatic parenchyma in the body and tail of pancreas is markedly atrophic. A subtle 2.5 x 3 cm lesion with differential signal intensity is identified in the head of the pancreas which appears slightly hypoenhancing on postcontrast imaging. Together, these features are highly concerning for pancreatic adenocarcinoma. EUS is recommended to further evaluate. 2. Portal vein is patent although there is some mass-effect on the portal splenic confluence and IVC. Fat planes around celiac axis and SMA appear preserved. 3. Upper normal to borderline portal caval lymph node associated with small right para-aortic lymphadenopathy.      10/30/2017 Tumor Marker    Baseline Ca 19-9 at 140    10/31/2017 Procedure    ERCP by Dr. Lyndel Safe 10/31/17  IMPRESSION Malignant appearing CBD stricture (due to pancreatic mass) status post sphincterotomy and stent insertion.    11/08/2017 Cancer Staging    Staging form: Exocrine Pancreas, AJCC 8th Edition -  Clinical stage from 11/08/2017: Stage IB (cT2, cN0, cM0) - Signed by Truitt Merle, MD on 11/09/2017    11/08/2017 Procedure    EUS and EGD by Dr. Ardis Hughs 11/08/17  IMPRESSION -Very vague, irregularly bordered, approximately 2.6cm mass was found in the head of pancreas. This causes biliary and pancreatic duct obstruction. The previously plastic biliary stent is in good position. The mass directly abuts and attenuates the PV/SMV confluence, suggesting invasion. FNA performed and the preliminary cytology review is positive for maligancy (adenocarcinoma). - [place] [facility] office will arrange referrals to medical and surgical oncology. - Case to be discussed at upcoming multidisciplinary GI tumor conference.    11/08/2017 Initial Biopsy    Biopsy Cytology 11/08/17 Diagnosis FINE NEEDLE ASPIRATION, ENDOSCOPIC, PANCREAS HEAD (SPECIMEN 1 OF 1 COLLECTED 11/08/17): MALIGNANT CELLS CONSISTENT WITH ADENOCARCINOMA.    11/09/2017 Initial Diagnosis    Pancreatic cancer ([place])    11/15/2017 Imaging    CT Chest and Pelvis WO Contrast 11/15/17 IMPRESSION: 1. No findings of active malignancy in the chest or pelvis. 2. Aortic Atherosclerosis (ICD10-I70.0). Coronary atherosclerosis. 3. Faint centrilobular nodularity in the lung apices, raising the possibility of hypersensitivity pneumonitis or less likely respiratory bronchiolitis. Airway thickening is present, suggesting bronchitis or reactive airways disease.    11/19/2017 - 03/07/2018 Chemotherapy    FOLFIRINOX q2weeks with Neulasta injection for 4 months starting 11/19/17. Completed 8 cycles on 03/07/18     02/04/2018 Imaging    CT AP W Contrast 02/04/18  IMPRESSION: 1. No substantial change in size of mass involving head of pancreas. A aortocaval lymph node which appears increased in the interval measuring 1.1  cm versus 0.8 cm previously. Stable 1.2 cm peripancreatic node. 2. Dilated proximal common bile duct is mildly increased in caliber compared with  12/12/2017. The common bile duct stent remains in place in appears to be in appropriate position.       CURRENT THERAPY:  PENDING Surgery on 04/25/18   INTERVAL HISTORY:  Jane Brooks is here for a follow up of pancreatic cancer. She presents to the clinic today by herself. She notes she feels stable and well. She is trying to make sure she eats more before surgery. She plans to have pre-op on 4/4. After her CT scan was not approved she has not heard anything from radiology.  She denies neuropathy but notes having more gas. She attributes this to increase in eating. She notes she still has abdominal pain and this is exacerbated by eating. She notes she has cut back further on smoking.     REVIEW OF SYSTEMS:   Constitutional: Denies fevers, chills or abnormal weight loss (+) adequate eating, weight gain  Eyes: Denies blurriness of vision Ears, nose, mouth, throat, and face: Denies mucositis or sore throat Respiratory: Denies cough, dyspnea or wheezes Cardiovascular: Denies palpitation, chest discomfort or lower extremity swelling Gastrointestinal:  Denies nausea, heartburn or change in bowel habits (+)gas (+) Stable abdominal pain  Skin: Denies abnormal skin rashes Lymphatics: Denies new lymphadenopathy or easy bruising Neurological:Denies numbness, tingling or new weaknesses Behavioral/Psych: Mood is stable, no new changes  All other systems were reviewed with the patient and are negative.  MEDICAL HISTORY:  Past Medical History:  Diagnosis Date  . Family history of lung cancer   . Family history of prostate cancer   . Family history of uterine cancer   . Gallstones 10/2017  . Pancreatic cancer ([place])   . Pneumothorax, closed, traumatic    years ago    SURGICAL HISTORY: Past Surgical History:  Procedure Laterality Date  . BILIARY STENT PLACEMENT  10/31/2017   Procedure: BILIARY STENT PLACEMENT;  Surgeon: [name], MD;  Location: [facility] ENDOSCOPY;  Service:  Gastroenterology;;  . ERCP N/A 10/31/2017   Procedure: ENDOSCOPIC RETROGRADE CHOLANGIOPANCREATOGRAPHY (ERCP);  Surgeon: [name], MD;  Location: [facility] ENDOSCOPY;  Service: Gastroenterology;  Laterality: N/A;  . ESOPHAGOGASTRODUODENOSCOPY N/A 11/08/2017   Procedure: ESOPHAGOGASTRODUODENOSCOPY (EGD);  Surgeon: Milus Banister, MD;  Location: [facility] ENDOSCOPY;  Service: Endoscopy;  Laterality: N/A;  . EUS N/A 11/08/2017   Procedure: UPPER ENDOSCOPIC ULTRASOUND (EUS) RADIAL;  Surgeon: Milus Banister, MD;  Location: [facility] ENDOSCOPY;  Service: Endoscopy;  Laterality: N/A;  . FINE NEEDLE ASPIRATION  11/08/2017   Procedure: FINE NEEDLE ASPIRATION;  Surgeon: Milus Banister, MD;  Location: [facility] ENDOSCOPY;  Service: Endoscopy;;  . IR IMAGING GUIDED PORT INSERTION  11/15/2017  . SPHINCTEROTOMY  10/31/2017   Procedure: SPHINCTEROTOMY;  Surgeon: [name], MD;  Location: [facility] ENDOSCOPY;  Service: Gastroenterology;;  . TONSILLECTOMY      I have reviewed the social history and family history with the patient and they are unchanged from previous note.  ALLERGIES:  is allergic to latex.  MEDICATIONS:  Current Outpatient Medications  Medication Sig Dispense Refill  . cyclobenzaprine (FLEXERIL) 5 MG tablet Take 1 tablet (5 mg total) by mouth 3 (three) times daily as needed for muscle spasms. 30 tablet 2  . DULoxetine (CYMBALTA) 20 MG capsule Take 1 capsule (20 mg total) by mouth 2 (two) times daily. Take once daily for 1 week then increase to BID 60 capsule 3  . lidocaine-prilocaine (EMLA) cream  Apply 1 application topically as needed. 30 g 2  . loperamide (IMODIUM) 2 MG capsule Take 2 capsules (4 mg total) by mouth every 6 (six) hours as needed for diarrhea or loose stools. 60 capsule 0  . loratadine (CLARITIN) 10 MG tablet Take 1 tablet (10 mg total) by mouth daily. Take for 5 days after the Udenyca injection 30 tablet 2  . morphine (MS CONTIN) 15 MG 12 hr tablet Take 1 tablet (15 mg total) by mouth every  12 (twelve) hours. 60 tablet 0  . morphine (MSIR) 15 MG tablet Take 1 tablet (15 mg total) by mouth every 6 (six) hours as needed for severe pain. 120 tablet 0  . ondansetron (ZOFRAN) 8 MG tablet Take 1 tablet (8 mg total) by mouth every 8 (eight) hours as needed for nausea or vomiting. 30 tablet 0  . prochlorperazine (COMPAZINE) 10 MG tablet Take 1 tablet (10 mg total) by mouth every 6 (six) hours as needed for nausea or vomiting. 30 tablet 3  . promethazine (PHENERGAN) 25 MG tablet Take 1 tablet (25 mg total) by mouth every 6 (six) hours as needed for nausea or vomiting. 30 tablet 0   No current facility-administered medications for this visit.     PHYSICAL EXAMINATION: ECOG PERFORMANCE STATUS: 1 - Symptomatic but completely ambulatory  Vitals:   04/05/18 1057  BP: 102/87  Pulse: 88  Resp: 18  Temp: 97.7 F (36.5 C)  SpO2: 100%   Filed Weights   04/05/18 1057  Weight: 128 lb 14.4 oz (58.5 kg)    GENERAL:alert, no distress and comfortable SKIN: skin color, texture, turgor are normal, no rashes or significant lesions EYES: normal, Conjunctiva are pink and non-injected, sclera clear OROPHARYNX:no exudate, no erythema and lips, buccal mucosa, and tongue normal  NECK: supple, thyroid normal size, non-tender, without nodularity LYMPH:  no palpable lymphadenopathy in the cervical, axillary or inguinal LUNGS: clear to auscultation and percussion with normal breathing effort HEART: regular rate & rhythm and no murmurs and no lower extremity edema ABDOMEN:abdomen soft, non-tender and normal bowel sounds Musculoskeletal:no cyanosis of digits and no clubbing  NEURO: alert & oriented x 3 with fluent speech, no focal motor/sensory deficits  LABORATORY DATA:  I have reviewed the data as listed CBC Latest Ref Rng & Units 04/04/2018 03/20/2018 03/06/2018  WBC 4.0 - 10.5 K/uL 6.7 5.8 5.5  Hemoglobin 12.0 - 15.0 g/dL 11.2(L) 10.4(L) 10.8(L)  Hematocrit 36.0 - 46.0 % 35.6(L) 33.4(L) 34.7(L)   Platelets 150 - 400 K/uL 221 165 144(L)     CMP Latest Ref Rng & Units 04/04/2018 03/20/2018 03/06/2018  Glucose 70 - 99 mg/dL 122(H) 115(H) 99  BUN 6 - 20 mg/dL 10 7 7   Creatinine 0.44 - 1.00 mg/dL 0.65 0.66 0.66  Sodium 135 - 145 mmol/L 140 138 140  Potassium 3.5 - 5.1 mmol/L 4.4 3.8 4.0  Chloride 98 - 111 mmol/L 107 103 103  CO2 22 - 32 mmol/L 23 26 28   Calcium 8.9 - 10.3 mg/dL 8.8(L) 8.7(L) 9.0  Total Protein 6.5 - 8.1 g/dL 7.4 7.2 7.4  Total Bilirubin 0.3 - 1.2 mg/dL 0.5 0.3 0.3  Alkaline Phos 38 - 126 U/L 280(H) 101 125  AST 15 - 41 U/L 32 31 19  ALT 0 - 44 U/L 82(H) 25 15      RADIOGRAPHIC STUDIES: I have personally reviewed the radiological images as listed and agreed with the findings in the report. No results found.   ASSESSMENT & PLAN:  Jane Brooks is a 48 y.o. female with   1. 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 -She has seen Dr. Barry Dienes who has set Whipple surgery for 04/25/18. She has completed 8 cycles, will not proceed with more neoadjuvant chemo.  -Her CT CAP was not approved so she missed it and has not been contacted again. I will follow up with radiology and get it done before surgery  -Labs reviewed, CBC and CMP WNL except mild anemia, BG at 122, Ca at 8.8, ALT at 82, alk phos at 280.  -F/u 3 weeks after surgery   2. Abdominal pain, secondary to #1 -Pain very controlled withMS Contin 15 mg every 12 hours, and use morphine15mPRN, Cymbalta and Flexeril.I refilled MS Contin and Flexeril today (03/06/18)  -I discussed if her pain is not significant she does not have to take pain medication as she can develop dependence over long term use. -Will hold on pain clinic  referral for now, but she agrees to see a pain specialist if she requests much higher dose or longer tern narcotics -Continue current pain regimen. I discussed after surgery we will start to ween her off narcotics. She understands.  -Pain stable  3. Social and FAcupuncturist-I previously referred to SW. -She has moved into her new apartment.  -She now has her puppy and doing well with him.  4. 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 Genetics -She was seen by the gDietitianGenetic testhasbeen held due to lack of insurance.  6. Nausea and weight loss -Continue antiemetics withCompazine,PhenerganandZofran as needed.  -Continuenutritional supplements such as Ensure. -Nausea now occasional. She is eating more frequent small meals and has been gaining weight lately. I encouraged her to continue.   7. Smoking Cessation  -She is currently down to smoking 3/4 ppd -I strongly encouraged her to continue to reduce and completely quit as going into surgery this can effect her recovery. She understands.  -She notes she has further cut back on smoking and continues to work on quitting completely. I encouraged her to continue.    Plan Lab, flush and f/u the week of 4/27  Will schedule her CT scan before surgery and I will call her with the results    No problem-specific Assessment & Plan notes found for this encounter.   No orders of the defined types were placed in this encounter.  All questions were answered. The patient knows to call the clinic with any problems, questions or concerns. No barriers to learning was detected. I spent 15 minutes counseling the patient face to face. The total time spent in the appointment was 20 minutes and more than 50% was on counseling and review of test results     YTruitt Merle MD 04/05/2018   I, AJoslyn Devon am acting as scribe  for YTruitt Merle MD.   I have reviewed the above documentation for accuracy and completeness, and I agree with the above.

## 2018-04-04 ENCOUNTER — Inpatient Hospital Stay: Payer: Medicaid Other

## 2018-04-04 ENCOUNTER — Other Ambulatory Visit: Payer: Self-pay

## 2018-04-04 DIAGNOSIS — C250 Malignant neoplasm of head of pancreas: Secondary | ICD-10-CM

## 2018-04-04 LAB — CBC WITH DIFFERENTIAL (CANCER CENTER ONLY)
Abs Immature Granulocytes: 0.03 10*3/uL (ref 0.00–0.07)
Basophils Absolute: 0 10*3/uL (ref 0.0–0.1)
Basophils Relative: 0 %
Eosinophils Absolute: 0.1 10*3/uL (ref 0.0–0.5)
Eosinophils Relative: 1 %
HCT: 35.6 % — ABNORMAL LOW (ref 36.0–46.0)
Hemoglobin: 11.2 g/dL — ABNORMAL LOW (ref 12.0–15.0)
Immature Granulocytes: 0 %
Lymphocytes Relative: 24 %
Lymphs Abs: 1.6 10*3/uL (ref 0.7–4.0)
MCH: 20.7 pg — ABNORMAL LOW (ref 26.0–34.0)
MCHC: 31.5 g/dL (ref 30.0–36.0)
MCV: 65.8 fL — ABNORMAL LOW (ref 80.0–100.0)
Monocytes Absolute: 0.5 10*3/uL (ref 0.1–1.0)
Monocytes Relative: 7 %
NEUTROS ABS: 4.5 10*3/uL (ref 1.7–7.7)
Neutrophils Relative %: 68 %
Platelet Count: 221 10*3/uL (ref 150–400)
RBC: 5.41 MIL/uL — ABNORMAL HIGH (ref 3.87–5.11)
RDW: 19 % — ABNORMAL HIGH (ref 11.5–15.5)
WBC Count: 6.7 10*3/uL (ref 4.0–10.5)
nRBC: 0 % (ref 0.0–0.2)

## 2018-04-04 LAB — CMP (CANCER CENTER ONLY)
ALT: 82 U/L — ABNORMAL HIGH (ref 0–44)
AST: 32 U/L (ref 15–41)
Albumin: 3.6 g/dL (ref 3.5–5.0)
Alkaline Phosphatase: 280 U/L — ABNORMAL HIGH (ref 38–126)
Anion gap: 10 (ref 5–15)
BUN: 10 mg/dL (ref 6–20)
CO2: 23 mmol/L (ref 22–32)
Calcium: 8.8 mg/dL — ABNORMAL LOW (ref 8.9–10.3)
Chloride: 107 mmol/L (ref 98–111)
Creatinine: 0.65 mg/dL (ref 0.44–1.00)
GFR, Est AFR Am: >60 mL/min (ref >60)
GFR, Estimated: >60 mL/min (ref >60)
Glucose, Bld: 122 mg/dL — ABNORMAL HIGH (ref 70–99)
POTASSIUM: 4.4 mmol/L (ref 3.5–5.1)
Sodium: 140 mmol/L (ref 135–145)
Total Bilirubin: 0.5 mg/dL (ref 0.3–1.2)
Total Protein: 7.4 g/dL (ref 6.5–8.1)

## 2018-04-04 MED ORDER — HEPARIN SOD (PORK) LOCK FLUSH 100 UNIT/ML IV SOLN
500.0000 [IU] | Freq: Once | INTRAVENOUS | Status: AC | PRN
  Administered 2018-04-04: 500 [IU]
  Filled 2018-04-04: qty 5

## 2018-04-04 MED ORDER — SODIUM CHLORIDE 0.9% FLUSH
10.0000 mL | INTRAVENOUS | Status: DC | PRN
  Administered 2018-04-04: 10 mL
  Filled 2018-04-04: qty 10

## 2018-04-05 ENCOUNTER — Encounter: Payer: Self-pay | Admitting: Hematology

## 2018-04-05 ENCOUNTER — Other Ambulatory Visit: Payer: Medicaid Other

## 2018-04-05 ENCOUNTER — Inpatient Hospital Stay (HOSPITAL_BASED_OUTPATIENT_CLINIC_OR_DEPARTMENT_OTHER): Payer: Medicaid Other | Admitting: Hematology

## 2018-04-05 ENCOUNTER — Telehealth: Payer: Self-pay | Admitting: Hematology

## 2018-04-05 DIAGNOSIS — I700 Atherosclerosis of aorta: Secondary | ICD-10-CM | POA: Diagnosis not present

## 2018-04-05 DIAGNOSIS — R1013 Epigastric pain: Secondary | ICD-10-CM

## 2018-04-05 DIAGNOSIS — D509 Iron deficiency anemia, unspecified: Secondary | ICD-10-CM

## 2018-04-05 DIAGNOSIS — C250 Malignant neoplasm of head of pancreas: Secondary | ICD-10-CM

## 2018-04-05 DIAGNOSIS — Z79899 Other long term (current) drug therapy: Secondary | ICD-10-CM

## 2018-04-05 DIAGNOSIS — Z8042 Family history of malignant neoplasm of prostate: Secondary | ICD-10-CM

## 2018-04-05 DIAGNOSIS — D6481 Anemia due to antineoplastic chemotherapy: Secondary | ICD-10-CM

## 2018-04-05 DIAGNOSIS — R918 Other nonspecific abnormal finding of lung field: Secondary | ICD-10-CM

## 2018-04-05 DIAGNOSIS — F17210 Nicotine dependence, cigarettes, uncomplicated: Secondary | ICD-10-CM

## 2018-04-05 DIAGNOSIS — R110 Nausea: Secondary | ICD-10-CM

## 2018-04-05 DIAGNOSIS — Z801 Family history of malignant neoplasm of trachea, bronchus and lung: Secondary | ICD-10-CM

## 2018-04-05 DIAGNOSIS — Z800 Family history of malignant neoplasm of digestive organs: Secondary | ICD-10-CM

## 2018-04-05 DIAGNOSIS — G893 Neoplasm related pain (acute) (chronic): Secondary | ICD-10-CM | POA: Diagnosis not present

## 2018-04-05 DIAGNOSIS — R634 Abnormal weight loss: Secondary | ICD-10-CM

## 2018-04-05 DIAGNOSIS — D563 Thalassemia minor: Secondary | ICD-10-CM

## 2018-04-05 DIAGNOSIS — Z9221 Personal history of antineoplastic chemotherapy: Secondary | ICD-10-CM

## 2018-04-05 NOTE — Telephone Encounter (Signed)
Called and scheduled appt per 3/20 los.  Patient is aware of appt date and time.

## 2018-04-08 ENCOUNTER — Telehealth: Payer: Self-pay

## 2018-04-08 ENCOUNTER — Other Ambulatory Visit: Payer: Self-pay | Admitting: Hematology

## 2018-04-08 DIAGNOSIS — C250 Malignant neoplasm of head of pancreas: Secondary | ICD-10-CM

## 2018-04-08 NOTE — Telephone Encounter (Signed)
Called [facility] to schedule CT abd pelvis, they are going to call the patient and get this scheduled.

## 2018-04-09 ENCOUNTER — Other Ambulatory Visit: Payer: Self-pay | Admitting: Hematology

## 2018-04-15 ENCOUNTER — Other Ambulatory Visit: Payer: Self-pay | Admitting: Hematology

## 2018-04-15 ENCOUNTER — Telehealth: Payer: Self-pay

## 2018-04-15 DIAGNOSIS — C250 Malignant neoplasm of head of pancreas: Secondary | ICD-10-CM

## 2018-04-15 DIAGNOSIS — R1013 Epigastric pain: Secondary | ICD-10-CM

## 2018-04-15 MED ORDER — MORPHINE SULFATE 15 MG PO TABS: 15.0000 mg | ORAL_TABLET | Freq: Four times a day (QID) | ORAL | 0 refills | Status: DC | PRN

## 2018-04-15 MED ORDER — MORPHINE SULFATE ER 15 MG PO TBCR: 15.0000 mg | EXTENDED_RELEASE_TABLET | Freq: Two times a day (BID) | ORAL | 0 refills | Status: DC

## 2018-04-15 MED FILL — MORPHINE SULF ER 15 MG TAB: 15 | 30 days supply | Qty: 60 | Fill #0

## 2018-04-15 NOTE — Telephone Encounter (Signed)
Patient calls requesting refills on:  Morphine extended release -  Last filled 2/20.    MSIR 15 mg (last filled 3/4  #120) requesting it be sent in but put on hold until time to fill.  [phone]  Forwarded to Dr. Burr Medico

## 2018-04-16 NOTE — Pre-Procedure Instructions (Signed)
[place]  04/16/2018      [facility] [place], [place] - [address] [place] [address] Phone: [phone] Fax: [place], [place] - [place] [address] Phone: [phone] Fax: [phone]    Your procedure is scheduled on Thursday 04/25/18 from [id number]  Report to [facility], Entrance A at [place].M.  Call this number if you have problems the morning of surgery:  [phone]   Remember:  Do not eat  after midnight.  You may drink clear liquids until  0430 .  Clear liquids allowed are:   Water, Juice (non-citric and without pulp), Carbonated beverages, Clear Tea, Black Coffee only, Plain Jell-O only, Gatorade and Plain Popsicles only.  Follow bowel prep and pre-surgery drink instructions given by your surgeon.    Take these medicines the morning of surgery with A SIP OF WATER :                        Morphine (MS CONTIN)              As needed :             Cyclobenzaprine(FLEXERIL)              Loperamide (IMODIUM)               Morphine (MSIR)              Ondansetron (ZOFRAN) or Prochlorperazine (COMPAZINE) or Promethazine [facility])                7 days prior to surgery (04/18/18) STOP taking any Aspirin (unless otherwise instructed by your surgeon), Aleve, Naproxen, Ibuprofen, Motrin, Advil, Goody's, BC's, all herbal medications, fish oil, and all vitamins.    Do not wear jewelry, make-up or nail polish.  Do not wear lotions, powders, or perfumes, or deodorant.  Do not shave 48 hours prior to surgery.  Men may shave face and neck.  Do not bring valuables to the hospital.  [facility] is not responsible for any belongings or valuables.  Contacts, dentures or bridgework may not be worn into surgery.  Leave your suitcase in the car.  After surgery it may be brought to your room.  For patients admitted to the  hospital, discharge time will be determined by your treatment team.  Patients discharged the day of surgery will not be allowed to drive home.   Special instructions:   [place]- Preparing For Surgery  Before surgery, you can play an important role. Because skin is not sterile, your skin needs to be as free of germs as possible. You can reduce the number of germs on your skin by washing with CHG (chlorahexidine gluconate) Soap before surgery.  CHG is an antiseptic cleaner which kills germs and bonds with the skin to continue killing germs even after washing.    Oral Hygiene is also important to reduce your risk of infection.  Remember - BRUSH YOUR TEETH THE MORNING OF SURGERY WITH YOUR REGULAR TOOTHPASTE  Please do not use if you have an allergy to CHG or antibacterial soaps. If your skin becomes reddened/irritated stop using the CHG.  Do not shave (including legs and underarms) for at least 48 hours prior to first CHG shower. It is OK to shave your face.  Please follow these instructions carefully.   1.  Shower the NIGHT BEFORE SURGERY and the MORNING OF SURGERY with CHG.   2. If you chose to wash your hair, wash your hair first as usual with your normal shampoo.  3. After you shampoo, rinse your hair and body thoroughly to remove the shampoo.  4. Use CHG as you would any other liquid soap. You can apply CHG directly to the skin and wash gently with a scrungie or a clean washcloth.   5. Apply the CHG Soap to your body ONLY FROM THE NECK DOWN.  Do not use on open wounds or open sores. Avoid contact with your eyes, ears, mouth and genitals (private parts). Wash Face and genitals (private parts)  with your normal soap.  6. Wash thoroughly, paying special attention to the area where your surgery will be performed.  7. Thoroughly rinse your body with warm water from the neck down.  8. DO NOT shower/wash with your normal soap after using and rinsing off the CHG Soap.  9. Pat yourself dry  with a CLEAN TOWEL.  10. Wear CLEAN PAJAMAS to bed the night before surgery, wear comfortable clothes the morning of surgery  11. Place CLEAN SHEETS on your bed the night of your first shower and DO NOT SLEEP WITH PETS.  Day of Surgery:  Do not apply any deodorants/lotions.  Please wear clean clothes to the hospital/surgery center.   Remember to brush your teeth WITH YOUR REGULAR TOOTHPASTE.  Please read over the following fact sheets that you were given. Pain Booklet, Coughing and Deep Breathing and Surgical Site Infection Prevention

## 2018-04-16 NOTE — Progress Notes (Signed)
Spoke with Abigail Butts, RN at [place] regarding clarification with specific amount of pre-surgical Ensure drinks. Message will be relayed to Dr. Barry Dienes for order clarification.   Jacqlyn Larsen, RN

## 2018-04-17 ENCOUNTER — Other Ambulatory Visit: Payer: Self-pay

## 2018-04-17 ENCOUNTER — Encounter (HOSPITAL_COMMUNITY): Payer: Self-pay

## 2018-04-17 ENCOUNTER — Other Ambulatory Visit: Payer: Self-pay | Admitting: General Surgery

## 2018-04-17 ENCOUNTER — Encounter (HOSPITAL_COMMUNITY)
Admission: RE | Admit: 2018-04-17 | Discharge: 2018-04-17 | Disposition: A | Discharge status: Home / Self Care | Payer: Medicaid Other | Source: Ambulatory Visit | Attending: General Surgery | Admitting: General Surgery

## 2018-04-17 DIAGNOSIS — Z01812 Encounter for preprocedural laboratory examination: Secondary | ICD-10-CM | POA: Insufficient documentation

## 2018-04-17 HISTORY — DX: Other specified postprocedural states: Z98.890

## 2018-04-17 HISTORY — DX: Other specified postprocedural states: R11.2

## 2018-04-17 LAB — URINALYSIS, ROUTINE W REFLEX MICROSCOPIC
Bilirubin Urine: NEGATIVE
Glucose, UA: NEGATIVE mg/dL
Hgb urine dipstick: NEGATIVE
Ketones, ur: NEGATIVE mg/dL
Leukocytes,Ua: NEGATIVE
Nitrite: NEGATIVE
Protein, ur: NEGATIVE mg/dL
Specific Gravity, Urine: 1.003 — ABNORMAL LOW (ref 1.005–1.030)
pH: 7 (ref 5.0–8.0)

## 2018-04-17 LAB — ABO/RH: ABO/RH(D): A POS

## 2018-04-17 LAB — CBC WITH DIFFERENTIAL/PLATELET
Abs Immature Granulocytes: 0.03 10*3/uL (ref 0.00–0.07)
Basophils Absolute: 0.1 10*3/uL (ref 0.0–0.1)
Basophils Relative: 1 %
Eosinophils Absolute: 0.3 10*3/uL (ref 0.0–0.5)
Eosinophils Relative: 4 %
HCT: 37.4 % (ref 36.0–46.0)
Hemoglobin: 11.7 g/dL — ABNORMAL LOW (ref 12.0–15.0)
Immature Granulocytes: 0 %
Lymphocytes Relative: 33 %
Lymphs Abs: 2.3 10*3/uL (ref 0.7–4.0)
MCH: 20.9 pg — ABNORMAL LOW (ref 26.0–34.0)
MCHC: 31.3 g/dL (ref 30.0–36.0)
MCV: 66.9 fL — ABNORMAL LOW (ref 80.0–100.0)
Monocytes Absolute: 0.5 10*3/uL (ref 0.1–1.0)
Monocytes Relative: 8 %
Neutro Abs: 3.7 10*3/uL (ref 1.7–7.7)
Neutrophils Relative %: 54 %
Platelets: 280 10*3/uL (ref 150–400)
RBC: 5.59 MIL/uL — ABNORMAL HIGH (ref 3.87–5.11)
RDW: 17.4 % — ABNORMAL HIGH (ref 11.5–15.5)
WBC: 6.7 10*3/uL (ref 4.0–10.5)
nRBC: 0 % (ref 0.0–0.2)

## 2018-04-17 LAB — COMPREHENSIVE METABOLIC PANEL
ALT: 80 U/L — ABNORMAL HIGH (ref 0–44)
AST: 55 U/L — ABNORMAL HIGH (ref 15–41)
Albumin: 3.8 g/dL (ref 3.5–5.0)
Alkaline Phosphatase: 341 U/L — ABNORMAL HIGH (ref 38–126)
Anion gap: 7 (ref 5–15)
BUN: 5 mg/dL — ABNORMAL LOW (ref 6–20)
CO2: 25 mmol/L (ref 22–32)
Calcium: 9.1 mg/dL (ref 8.9–10.3)
Chloride: 104 mmol/L (ref 98–111)
Creatinine, Ser: 0.61 mg/dL (ref 0.44–1.00)
GFR calc Af Amer: >60 mL/min (ref >60)
GFR calc non Af Amer: >60 mL/min (ref >60)
Glucose, Bld: 79 mg/dL (ref 70–99)
Potassium: 3.8 mmol/L (ref 3.5–5.1)
Sodium: 136 mmol/L (ref 135–145)
Total Bilirubin: 0.4 mg/dL (ref 0.3–1.2)
Total Protein: 7.4 g/dL (ref 6.5–8.1)

## 2018-04-17 LAB — PREPARE RBC (CROSSMATCH)

## 2018-04-17 LAB — PROTIME-INR
INR: 1 (ref 0.8–1.2)
Prothrombin Time: 12.6 seconds (ref 11.4–15.2)

## 2018-04-17 LAB — LIPASE, BLOOD: Lipase: 23 U/L (ref 11–51)

## 2018-04-17 NOTE — Progress Notes (Addendum)
PCP - None  Cardiologist - Denies  Chest x-ray - NA  EKG - NA  Stress Test - Denies  ECHO - Denies  Cardiac Cath - Denies  AICD-Denies PM-Denies LOOP-Denies  Sleep Study - Denies CPAP - Denies  LABS-CBC,CMP,INR,T/S, Lipase,UA   04/25/18 -U.preg  ASA-Denies  HA1C-NA Fasting Blood Sugar -  Checks Blood Sugar _____ times a day  Pt. Advised go to Dr. Marlowe Aschoff office to inquire about Pre-surgery drink. Morgan,RN already informed Wendy,RN from [employer] office on 04/16/18  Anesthesia-Y (Per MD order)  Pt denies having chest pain, sob, or fever at this time. All instructions explained to the pt, with a verbal understanding of the material. Pt agrees to go over the instructions while at home for a better understanding. The opportunity to ask questions was provided.

## 2018-04-17 NOTE — Progress Notes (Signed)
  Coronavirus Screening  Have you experienced the following symptoms:  Cough No Fever (>100.14F)  yes/no: No Runny nose yes/no: No Sore throat yes/no: No Difficulty breathing/shortness of breath  yes/no: No  Have you or a family member traveled in the last 14 days and where? yes/no: No   If the patient indicates "YES" to the above questions, their PAT will be rescheduled to limit the exposure to others and, the surgeon will be notified. THE PATIENT WILL NEED TO BE ASYMPTOMATIC FOR 14 DAYS.   If the patient is not experiencing any of these symptoms, the PAT nurse will instruct them to NOT bring anyone with them to their appointment since they may have these symptoms or traveled as well.   Please remind your patients and families that hospital visitation restrictions are in effect and the importance of the restrictions.

## 2018-04-18 NOTE — Progress Notes (Signed)
Anesthesia Chart Review:  Case:  [id number] Date/Time:  04/25/18 0715   Procedures:      LAPAROSCOPY DIAGNOSTIC (N/A ) - GENERAL AND EPIDURAL ANESTHESIA     POSSIBLE WHIPPLE PROCEDURE, FEEDING TUBE (N/A )   Anesthesia type:  General   Pre-op diagnosis:  Pancreatic cancer   Location:  [facility] OR ROOM 01 / [facility]   Surgeon:  Stark Klein, MD    Anesthesia Consult re: "discuss epidural pre op and appropriateness for epidural"   DISCUSSION: Patient is a 48 year old female scheduled for the above procedure.  History includes smoking, post-operative N/V, traumatic pneumothorax (remote), pancreatic cancer (s/p chemotherapy/Folfirinox 11/19/17-03/07/18). S/p right IJ single lumen PowerPort 11/15/17 by IR. - Admitted 10/30/17-10/31/17 with jaundice and itching. MRCP suggestive of pancreatic adenocarcinoma. GI consulted. S/p ERCP, sphincterotomy and stent insertion 10/31/17. Discharged with recommendations for GI follow-up and to get established with PCP. - ED visit 11/08/17 with abdominal pain with N/V following EGD. No evidence of pneumoperitoneum via imaging. Discharged after receiving IVF, pain and anti-nausea medications. (EGD pathology confirmed pancreatic adenocarcinoma.) - ED visits 12/12/17 and 02/04/18 for abdominal pain. CT findings stable. Symptoms improved with pain medications.   Patient was not in PAT when I received the chart for review, so I called and spoke with her about the role of epidural with Whipple procedures. Before I could really say much more, she stated, "I don't want one!" She says she has heard too much about people having complications with epidural. She did allow me to go on to discuss rationale and techniques used to minimize risk. She wants to discuss other means of post-operative pain control with Dr. Barry Dienes and says she is willling to let her anesthesiologist discuss an epidural with her on the day of surgery, but she is still doubtful that she will consent.  I have sent Dr. Barry Dienes  a staff message regarding this. Patient denied prior back surgery, she is not on any blood thinners, has no known heart murmur history, and denied rash or other skin abnormality on her back. Currently, I am not aware of any contraindications to an epidural if she does ultimately consent to having one.   LMP 10/25/17. She is for urine pregnancy test on the day of surgery. Anesthesiologist to evaluate prior to surgery and further discuss potential for epidural for pain management.   VS: BP 132/76   Pulse 82   Temp 36.7 C (Oral)   Resp 17   Ht 4' 11"  (1.499 m)   Wt 58.7 kg   LMP 10/25/2017   SpO2 97%   BMI 26.12 kg/m     PROVIDERS: Patient, No Pcp Per  Owens Loffler, MD is GI Truitt Merle, MD is [facility]. Last visit 04/05/18.    LABS: Labs reviewed: Acceptable for surgery. (all labs ordered are listed, but only abnormal results are displayed)  Labs Reviewed  CBC WITH DIFFERENTIAL/PLATELET - Abnormal; Notable for the following components:      Result Value   RBC 5.59 (*)    Hemoglobin 11.7 (*)    MCV 66.9 (*)    MCH 20.9 (*)    RDW 17.4 (*)    All other components within normal limits  COMPREHENSIVE METABOLIC PANEL - Abnormal; Notable for the following components:   BUN 5 (*)    AST 55 (*)    ALT 80 (*)    Alkaline Phosphatase 341 (*)    All other components within normal limits  URINALYSIS, ROUTINE W REFLEX MICROSCOPIC -  Abnormal; Notable for the following components:   Specific Gravity, Urine 1.003 (*)    All other components within normal limits  LIPASE, BLOOD  PROTIME-INR  TYPE AND SCREEN  PREPARE RBC (CROSSMATCH)    IMAGES: CT abd/pelvis 02/04/18: IMPRESSION: 1. No substantial change in size of mass involving head of pancreas. A aortocaval lymph node which appears increased in the interval measuring 1.1 cm versus 0.8 cm previously. Stable 1.2 cm peripancreatic node. 2. Dilated proximal common bile duct is mildly increased in caliber compared with 12/12/2017. The  common bile duct stent remains in place in appears to be in appropriate position.   EKG: N/A   CV: N/A  Past Medical History:  Diagnosis Date  . Family history of lung cancer   . Family history of prostate cancer   . Family history of uterine cancer   . Gallstones 10/2017  . Pancreatic cancer ([place])   . Pneumothorax, closed, traumatic    years ago  . PONV (postoperative nausea and vomiting)     Past Surgical History:  Procedure Laterality Date  . BILIARY STENT PLACEMENT  10/31/2017   Procedure: BILIARY STENT PLACEMENT;  Surgeon: [name], MD;  Location: [facility] ENDOSCOPY;  Service: Gastroenterology;;  . ERCP N/A 10/31/2017   Procedure: ENDOSCOPIC RETROGRADE CHOLANGIOPANCREATOGRAPHY (ERCP);  Surgeon: [name], MD;  Location: [facility] ENDOSCOPY;  Service: Gastroenterology;  Laterality: N/A;  . ESOPHAGOGASTRODUODENOSCOPY N/A 11/08/2017   Procedure: ESOPHAGOGASTRODUODENOSCOPY (EGD);  Surgeon: Milus Banister, MD;  Location: [facility] ENDOSCOPY;  Service: Endoscopy;  Laterality: N/A;  . EUS N/A 11/08/2017   Procedure: UPPER ENDOSCOPIC ULTRASOUND (EUS) RADIAL;  Surgeon: Milus Banister, MD;  Location: [facility] ENDOSCOPY;  Service: Endoscopy;  Laterality: N/A;  . FINE NEEDLE ASPIRATION  11/08/2017   Procedure: FINE NEEDLE ASPIRATION;  Surgeon: Milus Banister, MD;  Location: [facility] ENDOSCOPY;  Service: Endoscopy;;  . IR IMAGING GUIDED PORT INSERTION  11/15/2017  . SPHINCTEROTOMY  10/31/2017   Procedure: SPHINCTEROTOMY;  Surgeon: [name], MD;  Location: [facility] ENDOSCOPY;  Service: Gastroenterology;;  . TONSILLECTOMY      MEDICATIONS: . cyclobenzaprine (FLEXERIL) 5 MG tablet  . DULoxetine (CYMBALTA) 20 MG capsule  . lidocaine-prilocaine (EMLA) cream  . loperamide (IMODIUM) 2 MG capsule  . loratadine (CLARITIN) 10 MG tablet  . morphine (MS CONTIN) 15 MG 12 hr tablet  . morphine (MSIR) 15 MG tablet  . ondansetron (ZOFRAN) 8 MG tablet  . prochlorperazine (COMPAZINE) 10 MG tablet  .  promethazine (PHENERGAN) 25 MG tablet   No current facility-administered medications for this encounter.     Myra Gianotti, PA-C Surgical Short Stay/Anesthesiology [facility] Phone [phone] [facility] Phone [phone] 04/18/2018 3:33 PM

## 2018-04-18 NOTE — Anesthesia Preprocedure Evaluation (Addendum)
Anesthesia Evaluation  Patient identified by MRN, date of birth, ID band Patient awake    Reviewed: Allergy & Precautions, H&P , NPO status , Patient's Chart, lab work & pertinent test results, reviewed documented beta blocker date and time   History of Anesthesia Complications (+) PONV and history of anesthetic complications  Airway Mallampati: II  TM Distance: >3 FB Neck ROM: full    Dental no notable dental hx.    Pulmonary neg pulmonary ROS, Current Smoker,    Pulmonary exam normal breath sounds clear to auscultation       Cardiovascular Exercise Tolerance: Good negative cardio ROS   Rhythm:regular Rate:Normal     Neuro/Psych negative neurological ROS  negative psych ROS   GI/Hepatic negative GI ROS, Neg liver ROS,   Endo/Other  negative endocrine ROS  Renal/GU negative Renal ROS  negative genitourinary   Musculoskeletal   Abdominal   Peds  Hematology  (+) Blood dyscrasia, anemia ,   Anesthesia Other Findings   Reproductive/Obstetrics negative OB ROS                            Anesthesia Physical Anesthesia Plan  ASA: III  Anesthesia Plan: General   Post-op Pain Management: GA combined w/ Regional for post-op pain   Induction: Intravenous  PONV Risk Score and Plan: 3 and Ondansetron, Treatment may vary due to age or medical condition and Dexamethasone  Airway Management Planned: Oral ETT  Additional Equipment:   Intra-op Plan:   Post-operative Plan: Extubation in OR  Informed Consent: I have reviewed the patients History and Physical, chart, labs and discussed the procedure including the risks, benefits and alternatives for the proposed anesthesia with the patient or authorized representative who has indicated his/her understanding and acceptance.     Dental Advisory Given  Plan Discussed with: CRNA, Anesthesiologist and Surgeon  Anesthesia Plan Comments: (See  PAT note written 04/18/2018 by Myra Gianotti, PA-C. Dr. Barry Dienes has posted for GA and epidural. Patient is reluctant to agree to epidural, but is willing to discuss further with her assigned anesthesiologist and Dr. Barry Dienes on the day of surgery.  )       Anesthesia Quick Evaluation

## 2018-04-19 ENCOUNTER — Other Ambulatory Visit: Payer: Self-pay

## 2018-04-19 ENCOUNTER — Ambulatory Visit
Admission: RE | Admit: 2018-04-19 | Discharge: 2018-04-19 | Disposition: A | Discharge status: Home / Self Care | Payer: Medicaid Other | Source: Ambulatory Visit | Attending: Hematology | Admitting: Hematology

## 2018-04-19 DIAGNOSIS — C250 Malignant neoplasm of head of pancreas: Secondary | ICD-10-CM

## 2018-04-19 MED ORDER — IOPAMIDOL (ISOVUE-300) INJECTION 61%
100.0000 mL | Freq: Once | INTRAVENOUS | Status: AC | PRN
  Administered 2018-04-19: 13:00:00 100 mL via INTRAVENOUS

## 2018-04-19 MED FILL — MORPHINE SULFATE IR 15 MG T: 15 | 30 days supply | Qty: 120 | Fill #0

## 2018-04-19 MED FILL — DULOXETINE HCL 20 MG CPEP: 20 | 33 days supply | Qty: 60 | Fill #1

## 2018-04-19 MED FILL — CYCLOBENZAPRINE HCL 5 MG TA: 5 | 10 days supply | Qty: 30 | Fill #1

## 2018-04-24 ENCOUNTER — Encounter: Payer: Self-pay | Admitting: General Practice

## 2018-04-24 NOTE — Progress Notes (Signed)
Patient did not answer phone. Unable to complete Coronavirus Screening at this time.

## 2018-04-24 NOTE — H&P (Signed)
Jane Brooks  Location: [facility] Surgery Patient #: [id number] DOB: 04-02-1970 Divorced / Language: English / Race: Black or African American Female   History of Present Illness The patient is a 48 year old female who presents for a Follow-up for Pancreatic cancer. Pt is a 48 yo F referred for consultation by Dr. Burr Medico for pancreatic cancer. She presented with cholecystitis and biliary ductal dilation. She was found to have a pancreatic head mass that was borderline resectable due to significant venous invasion. She started chemo 11/19/2017 (folfirinox). She has also been having significant pain. She was seen in the ED over thanksgiving due to significant pain. She deliberately lost around 30 pounds in 2018 due to going on the keto diet, but was stable until september/october when she lost around 10-15 additional pounds.   Since being on chemo she has not lost additional weight. She swings between 123 and 128 pounds but this is stable. She is having quite a bit of upper abdominal pain and is on MS contin for this. She has some cold sensitivity of her fingers that takes usually a week to resolve.   Most recent labs wtih HT 34.7, Plts 144k, normal CMET and CA 19-9 down to 73 from high of 140   CT abd/pelvis 02/04/2018 IMPRESSION: 1. No substantial change in size of mass involving head of pancreas. A aortocaval lymph node which appears increased in the interval measuring 1.1 cm versus 0.8 cm previously. Stable 1.2 cm peripancreatic node. 2. Dilated proximal common bile duct is mildly increased in caliber compared with 12/12/2017. The common bile duct stent remains in place in appears to be in appropriate position.       Allergies  Latex  Rash, Swelling.  Medication History DULoxetine HCl (20MG Capsule DR Part, Oral) Active. Effexor XR (Oral) Specific strength unknown - Active. Morphine Sulfate (15MG Tablet, Oral) Active. Medications Reconciled  Vitals Weight:  124.25 lb Height: 58in Body Surface Area: 1.49 m Body Mass Index: 25.97 kg/m  Temp.: 98.51F  Pulse: 88 (Regular)  BP: 130/88 (Sitting, Left Arm, Standard)       Physical Exam General Mental Status-Alert. General Appearance-Consistent with stated age. Hydration-Well hydrated. Voice-Normal.  Head and Neck Head-normocephalic, atraumatic with no lesions or palpable masses.  Eye Sclera/Conjunctiva - Bilateral-No scleral icterus.  Chest and Lung Exam Chest and lung exam reveals -quiet, even and easy respiratory effort with no use of accessory muscles. Inspection Chest Wall - Normal. Back - normal.  Breast - Did not examine.  Cardiovascular Cardiovascular examination reveals -normal pedal pulses bilaterally. Note: regular rate and rhythm  Abdomen Inspection-Inspection Normal. Palpation/Percussion Palpation and Percussion of the abdomen reveal - Soft, No Rebound tenderness, No Rigidity (guarding) and No hepatosplenomegaly. Note: tender RUQ and epigastric region.  Peripheral Vascular Upper Extremity Inspection - Bilateral - Normal - No Clubbing, No Cyanosis, No Edema, Pulses Intact. Lower Extremity Palpation - Edema - Bilateral - No edema.  Neurologic Neurologic evaluation reveals -alert and oriented x 3 with no impairment of recent or remote memory. Mental Status-Normal.  Musculoskeletal Global Assessment -Note: no gross deformities.  Normal Exam - Left-Upper Extremity Strength Normal and Lower Extremity Strength Normal. Normal Exam - Right-Upper Extremity Strength Normal and Lower Extremity Strength Normal.  Lymphatic Head & Neck  General Head & Neck Lymphatics: Bilateral - Description - Normal. Axillary  General Axillary Region: Bilateral - Description - Normal. Tenderness - Non Tender.    Assessment & Plan ADENOCARCINOMA OF HEAD OF PANCREAS (C25.0) Impression: Will proceed with surgery.  Will get half bowel prep.  I think the "aortocaval node" is close enough that it will come out with surgery. It is not buried between the two vessels.  I discussed the surgery with the patient including diagrams of anatomy. I discussed the potential for diagnostic laparoscopy. In the case of pancreatic cancer, if spread of the disease is found, we will abort the procedure and not proceed with resection. The rationale for this was discussed with the patient. There has not been data to support resection of Stage IV disease in terms of survival benefit.  We discussed possible complications including: Potential of aborting procedure if tumor is invading the superior mesenteric or hepatic arteries Bleeding Infection and possible wound complications such as hernia Damage to adjacent structures Leak of anastamoses, primarily pancreatic Possible need for other procedures Possible prolonged nausea with possible need for external feeding. Possible prolonged hospital stay. Possible development of diabetes or worsening of current diabetes. Possible pancreatic exocrine insufficiency Prolonged fatigue/weakness/appetite Possible early recurrence of cancer   The patient understands and wishes to proceed. The patient has been advised to turn in disability paperwork to our office.  30 min spent in evaluation, examination, counseling, and coordination of care. >50% spent in counseling. Current Plans You are being scheduled for surgery- Our schedulers will call you.  You should hear from our office's scheduling department within 5 working days about the location, date, and time of surgery. We try to make accommodations for patient's preferences in scheduling surgery, but sometimes the OR schedule or the surgeon's schedule prevents [place] from making those accommodations.  If you have not heard from our office [phone]) in 5 working days, call the office and ask for your surgeon's nurse.  If you have other questions about your  diagnosis, plan, or surgery, call the office and ask for your surgeon's nurse.  Pt Education - flb whipple pt info Pt Education - [facility] Colon Bowel Prep 2018 ERAS/Miralax/Antibiotics   Signed by Stark Klein, MD

## 2018-04-24 NOTE — Progress Notes (Signed)
[facility] Team contacted patient to assess for food insecurity and other psychosocial needs during current COVID19 pandemic.  Left VM for patient asking her to return call and check in.     Beverely Pace, [place]

## 2018-04-25 ENCOUNTER — Inpatient Hospital Stay (HOSPITAL_COMMUNITY)
Admission: RE | Admit: 2018-04-25 | Discharge: 2018-05-16 | DRG: 405 | Disposition: A | Discharge status: Rehabilitation Facility | Payer: Medicaid Other | Attending: General Surgery | Admitting: General Surgery

## 2018-04-25 ENCOUNTER — Encounter (HOSPITAL_COMMUNITY)
Admission: RE | Disposition: A | Discharge status: Rehabilitation Facility | Payer: Self-pay | Source: Home / Self Care | Attending: General Surgery

## 2018-04-25 ENCOUNTER — Inpatient Hospital Stay (HOSPITAL_COMMUNITY): Payer: Medicaid Other | Admitting: Physician Assistant

## 2018-04-25 ENCOUNTER — Other Ambulatory Visit: Payer: Self-pay

## 2018-04-25 ENCOUNTER — Encounter (HOSPITAL_COMMUNITY): Payer: Self-pay

## 2018-04-25 ENCOUNTER — Inpatient Hospital Stay (HOSPITAL_COMMUNITY): Payer: Medicaid Other

## 2018-04-25 ENCOUNTER — Inpatient Hospital Stay (HOSPITAL_COMMUNITY): Payer: Medicaid Other | Admitting: Anesthesiology

## 2018-04-25 DIAGNOSIS — Z6829 Body mass index (BMI) 29.0-29.9, adult: Secondary | ICD-10-CM

## 2018-04-25 DIAGNOSIS — G9349 Other encephalopathy: Secondary | ICD-10-CM | POA: Diagnosis not present

## 2018-04-25 DIAGNOSIS — E873 Alkalosis: Secondary | ICD-10-CM | POA: Diagnosis not present

## 2018-04-25 DIAGNOSIS — Z9221 Personal history of antineoplastic chemotherapy: Secondary | ICD-10-CM

## 2018-04-25 DIAGNOSIS — Z978 Presence of other specified devices: Secondary | ICD-10-CM | POA: Diagnosis not present

## 2018-04-25 DIAGNOSIS — E43 Unspecified severe protein-calorie malnutrition: Secondary | ICD-10-CM | POA: Diagnosis not present

## 2018-04-25 DIAGNOSIS — Z419 Encounter for procedure for purposes other than remedying health state, unspecified: Secondary | ICD-10-CM

## 2018-04-25 DIAGNOSIS — C259 Malignant neoplasm of pancreas, unspecified: Secondary | ICD-10-CM | POA: Diagnosis present

## 2018-04-25 DIAGNOSIS — E861 Hypovolemia: Secondary | ICD-10-CM | POA: Diagnosis not present

## 2018-04-25 DIAGNOSIS — R0682 Tachypnea, not elsewhere classified: Secondary | ICD-10-CM | POA: Diagnosis not present

## 2018-04-25 DIAGNOSIS — E874 Mixed disorder of acid-base balance: Secondary | ICD-10-CM | POA: Diagnosis present

## 2018-04-25 DIAGNOSIS — R34 Anuria and oliguria: Secondary | ICD-10-CM | POA: Diagnosis not present

## 2018-04-25 DIAGNOSIS — E870 Hyperosmolality and hypernatremia: Secondary | ICD-10-CM | POA: Diagnosis not present

## 2018-04-25 DIAGNOSIS — R0602 Shortness of breath: Secondary | ICD-10-CM

## 2018-04-25 DIAGNOSIS — Z801 Family history of malignant neoplasm of trachea, bronchus and lung: Secondary | ICD-10-CM

## 2018-04-25 DIAGNOSIS — Z8042 Family history of malignant neoplasm of prostate: Secondary | ICD-10-CM

## 2018-04-25 DIAGNOSIS — Z79891 Long term (current) use of opiate analgesic: Secondary | ICD-10-CM

## 2018-04-25 DIAGNOSIS — G8929 Other chronic pain: Secondary | ICD-10-CM | POA: Diagnosis not present

## 2018-04-25 DIAGNOSIS — J189 Pneumonia, unspecified organism: Secondary | ICD-10-CM | POA: Diagnosis not present

## 2018-04-25 DIAGNOSIS — D6481 Anemia due to antineoplastic chemotherapy: Secondary | ICD-10-CM | POA: Diagnosis present

## 2018-04-25 DIAGNOSIS — D630 Anemia in neoplastic disease: Secondary | ICD-10-CM | POA: Diagnosis present

## 2018-04-25 DIAGNOSIS — K7200 Acute and subacute hepatic failure without coma: Secondary | ICD-10-CM | POA: Diagnosis not present

## 2018-04-25 DIAGNOSIS — K8681 Exocrine pancreatic insufficiency: Secondary | ICD-10-CM | POA: Diagnosis present

## 2018-04-25 DIAGNOSIS — J9601 Acute respiratory failure with hypoxia: Secondary | ICD-10-CM | POA: Diagnosis present

## 2018-04-25 DIAGNOSIS — R069 Unspecified abnormalities of breathing: Secondary | ICD-10-CM

## 2018-04-25 DIAGNOSIS — I10 Essential (primary) hypertension: Secondary | ICD-10-CM | POA: Diagnosis not present

## 2018-04-25 DIAGNOSIS — R000 Tachycardia, unspecified: Secondary | ICD-10-CM | POA: Diagnosis not present

## 2018-04-25 DIAGNOSIS — R740 Nonspecific elevation of levels of transaminase and lactic acid dehydrogenase [LDH]: Secondary | ICD-10-CM | POA: Diagnosis not present

## 2018-04-25 DIAGNOSIS — E876 Hypokalemia: Secondary | ICD-10-CM | POA: Diagnosis not present

## 2018-04-25 DIAGNOSIS — R739 Hyperglycemia, unspecified: Secondary | ICD-10-CM | POA: Diagnosis not present

## 2018-04-25 DIAGNOSIS — R140 Abdominal distension (gaseous): Secondary | ICD-10-CM

## 2018-04-25 DIAGNOSIS — Z8049 Family history of malignant neoplasm of other genital organs: Secondary | ICD-10-CM

## 2018-04-25 DIAGNOSIS — Y95 Nosocomial condition: Secondary | ICD-10-CM | POA: Diagnosis not present

## 2018-04-25 DIAGNOSIS — Z781 Physical restraint status: Secondary | ICD-10-CM

## 2018-04-25 DIAGNOSIS — D62 Acute posthemorrhagic anemia: Secondary | ICD-10-CM | POA: Diagnosis not present

## 2018-04-25 DIAGNOSIS — T451X5A Adverse effect of antineoplastic and immunosuppressive drugs, initial encounter: Secondary | ICD-10-CM | POA: Diagnosis present

## 2018-04-25 DIAGNOSIS — R5381 Other malaise: Secondary | ICD-10-CM | POA: Diagnosis not present

## 2018-04-25 DIAGNOSIS — K838 Other specified diseases of biliary tract: Secondary | ICD-10-CM | POA: Diagnosis present

## 2018-04-25 DIAGNOSIS — Z0189 Encounter for other specified special examinations: Secondary | ICD-10-CM

## 2018-04-25 DIAGNOSIS — G8918 Other acute postprocedural pain: Secondary | ICD-10-CM | POA: Diagnosis not present

## 2018-04-25 DIAGNOSIS — D696 Thrombocytopenia, unspecified: Secondary | ICD-10-CM | POA: Diagnosis not present

## 2018-04-25 DIAGNOSIS — A419 Sepsis, unspecified organism: Secondary | ICD-10-CM | POA: Diagnosis not present

## 2018-04-25 DIAGNOSIS — K819 Cholecystitis, unspecified: Secondary | ICD-10-CM | POA: Diagnosis present

## 2018-04-25 DIAGNOSIS — R188 Other ascites: Secondary | ICD-10-CM | POA: Diagnosis not present

## 2018-04-25 DIAGNOSIS — F17210 Nicotine dependence, cigarettes, uncomplicated: Secondary | ICD-10-CM | POA: Diagnosis present

## 2018-04-25 DIAGNOSIS — R7989 Other specified abnormal findings of blood chemistry: Secondary | ICD-10-CM

## 2018-04-25 DIAGNOSIS — Z8507 Personal history of malignant neoplasm of pancreas: Secondary | ICD-10-CM | POA: Diagnosis not present

## 2018-04-25 DIAGNOSIS — R6521 Severe sepsis with septic shock: Secondary | ICD-10-CM | POA: Diagnosis not present

## 2018-04-25 DIAGNOSIS — R339 Retention of urine, unspecified: Secondary | ICD-10-CM | POA: Diagnosis not present

## 2018-04-25 DIAGNOSIS — I9581 Postprocedural hypotension: Secondary | ICD-10-CM | POA: Diagnosis not present

## 2018-04-25 DIAGNOSIS — R945 Abnormal results of liver function studies: Secondary | ICD-10-CM

## 2018-04-25 DIAGNOSIS — Z91040 Latex allergy status: Secondary | ICD-10-CM

## 2018-04-25 DIAGNOSIS — G9340 Encephalopathy, unspecified: Secondary | ICD-10-CM | POA: Diagnosis not present

## 2018-04-25 DIAGNOSIS — F419 Anxiety disorder, unspecified: Secondary | ICD-10-CM | POA: Diagnosis present

## 2018-04-25 DIAGNOSIS — C250 Malignant neoplasm of head of pancreas: Secondary | ICD-10-CM | POA: Diagnosis present

## 2018-04-25 DIAGNOSIS — J9600 Acute respiratory failure, unspecified whether with hypoxia or hypercapnia: Secondary | ICD-10-CM

## 2018-04-25 DIAGNOSIS — R338 Other retention of urine: Secondary | ICD-10-CM | POA: Diagnosis not present

## 2018-04-25 DIAGNOSIS — R609 Edema, unspecified: Secondary | ICD-10-CM | POA: Diagnosis not present

## 2018-04-25 DIAGNOSIS — J9690 Respiratory failure, unspecified, unspecified whether with hypoxia or hypercapnia: Secondary | ICD-10-CM

## 2018-04-25 DIAGNOSIS — N179 Acute kidney failure, unspecified: Secondary | ICD-10-CM | POA: Diagnosis not present

## 2018-04-25 DIAGNOSIS — N170 Acute kidney failure with tubular necrosis: Secondary | ICD-10-CM | POA: Diagnosis not present

## 2018-04-25 DIAGNOSIS — Z79899 Other long term (current) drug therapy: Secondary | ICD-10-CM

## 2018-04-25 HISTORY — PX: LAPAROSCOPY: SHX197

## 2018-04-25 HISTORY — PX: WHIPPLE PROCEDURE: SHX2667

## 2018-04-25 LAB — CBC WITH DIFFERENTIAL/PLATELET
Abs Immature Granulocytes: 0.23 10*3/uL — ABNORMAL HIGH (ref 0.00–0.07)
Basophils Absolute: 0 10*3/uL (ref 0.0–0.1)
Basophils Relative: 0 %
Eosinophils Absolute: 0 10*3/uL (ref 0.0–0.5)
Eosinophils Relative: 0 %
HCT: 33.9 % — ABNORMAL LOW (ref 36.0–46.0)
Hemoglobin: 10.9 g/dL — ABNORMAL LOW (ref 12.0–15.0)
Immature Granulocytes: 1 %
Lymphocytes Relative: 5 %
Lymphs Abs: 1.1 10*3/uL (ref 0.7–4.0)
MCH: 22.6 pg — ABNORMAL LOW (ref 26.0–34.0)
MCHC: 32.2 g/dL (ref 30.0–36.0)
MCV: 70.2 fL — ABNORMAL LOW (ref 80.0–100.0)
Monocytes Absolute: 1.1 10*3/uL — ABNORMAL HIGH (ref 0.1–1.0)
Monocytes Relative: 5 %
Neutro Abs: 21.1 10*3/uL — ABNORMAL HIGH (ref 1.7–7.7)
Neutrophils Relative %: 89 %
Platelets: 170 10*3/uL (ref 150–400)
RBC: 4.83 MIL/uL (ref 3.87–5.11)
RDW: 16.6 % — ABNORMAL HIGH (ref 11.5–15.5)
WBC: 23.6 10*3/uL — ABNORMAL HIGH (ref 4.0–10.5)
nRBC: 0.4 % — ABNORMAL HIGH (ref 0.0–0.2)

## 2018-04-25 LAB — BLOOD GAS, ARTERIAL
Acid-base deficit: 10.7 mmol/L — ABNORMAL HIGH (ref 0.0–2.0)
Bicarbonate: 15.3 mmol/L — ABNORMAL LOW (ref 20.0–28.0)
FIO2: 40
MECHVT: 450 mL
O2 Saturation: 98.8 %
PEEP: 5 cmH2O
Patient temperature: 98.6
RATE: 16 resp/min
pCO2 arterial: 37.8 mmHg (ref 32.0–48.0)
pH, Arterial: 7.233 — ABNORMAL LOW (ref 7.350–7.450)
pO2, Arterial: 168 mmHg — ABNORMAL HIGH (ref 83.0–108.0)

## 2018-04-25 LAB — POCT I-STAT 7, (LYTES, BLD GAS, ICA,H+H)
Acid-base deficit: 6 mmol/L — ABNORMAL HIGH (ref 0.0–2.0)
Acid-base deficit: 9 mmol/L — ABNORMAL HIGH (ref 0.0–2.0)
Acid-base deficit: 2 mmol/L (ref 0.0–2.0)
Acid-base deficit: 5 mmol/L — ABNORMAL HIGH (ref 0.0–2.0)
Acid-base deficit: 13 mmol/L — ABNORMAL HIGH (ref 0.0–2.0)
Acid-base deficit: 10 mmol/L — ABNORMAL HIGH (ref 0.0–2.0)
Bicarbonate: 26.4 mmol/L (ref 20.0–28.0)
Bicarbonate: 21 mmol/L (ref 20.0–28.0)
Bicarbonate: 19.9 mmol/L — ABNORMAL LOW (ref 20.0–28.0)
Bicarbonate: 22.8 mmol/L (ref 20.0–28.0)
Bicarbonate: 25.5 mmol/L (ref 20.0–28.0)
Bicarbonate: 16.5 mmol/L — ABNORMAL LOW (ref 20.0–28.0)
Bicarbonate: 18.1 mmol/L — ABNORMAL LOW (ref 20.0–28.0)
Calcium, Ion: 1.17 mmol/L (ref 1.15–1.40)
Calcium, Ion: 1 mmol/L — ABNORMAL LOW (ref 1.15–1.40)
Calcium, Ion: 1.02 mmol/L — ABNORMAL LOW (ref 1.15–1.40)
Calcium, Ion: 1.1 mmol/L — ABNORMAL LOW (ref 1.15–1.40)
Calcium, Ion: 1.18 mmol/L (ref 1.15–1.40)
Calcium, Ion: 1.04 mmol/L — ABNORMAL LOW (ref 1.15–1.40)
Calcium, Ion: 1 mmol/L — ABNORMAL LOW (ref 1.15–1.40)
HCT: 31 % — ABNORMAL LOW (ref 36.0–46.0)
HCT: 25 % — ABNORMAL LOW (ref 36.0–46.0)
HCT: 39 % (ref 36.0–46.0)
HCT: 33 % — ABNORMAL LOW (ref 36.0–46.0)
HCT: 35 % — ABNORMAL LOW (ref 36.0–46.0)
HCT: 34 % — ABNORMAL LOW (ref 36.0–46.0)
HCT: 34 % — ABNORMAL LOW (ref 36.0–46.0)
Hemoglobin: 10.5 g/dL — ABNORMAL LOW (ref 12.0–15.0)
Hemoglobin: 8.5 g/dL — ABNORMAL LOW (ref 12.0–15.0)
Hemoglobin: 13.3 g/dL (ref 12.0–15.0)
Hemoglobin: 11.2 g/dL — ABNORMAL LOW (ref 12.0–15.0)
Hemoglobin: 11.9 g/dL — ABNORMAL LOW (ref 12.0–15.0)
Hemoglobin: 11.6 g/dL — ABNORMAL LOW (ref 12.0–15.0)
Hemoglobin: 11.6 g/dL — ABNORMAL LOW (ref 12.0–15.0)
O2 Saturation: 99 %
O2 Saturation: 100 %
O2 Saturation: 100 %
O2 Saturation: 100 %
O2 Saturation: 100 %
O2 Saturation: 98 %
O2 Saturation: 99 %
Patient temperature: 35.4
Patient temperature: 35.5
Patient temperature: 35.7
Patient temperature: 35.7
Patient temperature: 35.8
Patient temperature: 96.9
Patient temperature: 96.5
Potassium: 3.6 mmol/L (ref 3.5–5.1)
Potassium: 5 mmol/L (ref 3.5–5.1)
Potassium: 4.3 mmol/L (ref 3.5–5.1)
Potassium: 4.2 mmol/L (ref 3.5–5.1)
Potassium: 4.6 mmol/L (ref 3.5–5.1)
Potassium: 4.5 mmol/L (ref 3.5–5.1)
Potassium: 4.2 mmol/L (ref 3.5–5.1)
Sodium: 136 mmol/L (ref 135–145)
Sodium: 137 mmol/L (ref 135–145)
Sodium: 136 mmol/L (ref 135–145)
Sodium: 137 mmol/L (ref 135–145)
Sodium: 139 mmol/L (ref 135–145)
Sodium: 142 mmol/L (ref 135–145)
Sodium: 141 mmol/L (ref 135–145)
TCO2: 28 mmol/L (ref 22–32)
TCO2: 23 mmol/L (ref 22–32)
TCO2: 22 mmol/L (ref 22–32)
TCO2: 24 mmol/L (ref 22–32)
TCO2: 27 mmol/L (ref 22–32)
TCO2: 18 mmol/L — ABNORMAL LOW (ref 22–32)
TCO2: 19 mmol/L — ABNORMAL LOW (ref 22–32)
pCO2 arterial: 45.9 mmHg (ref 32.0–48.0)
pCO2 arterial: 47.8 mmHg (ref 32.0–48.0)
pCO2 arterial: 53 mmHg — ABNORMAL HIGH (ref 32.0–48.0)
pCO2 arterial: 48.3 mmHg — ABNORMAL HIGH (ref 32.0–48.0)
pCO2 arterial: 51.5 mmHg — ABNORMAL HIGH (ref 32.0–48.0)
pCO2 arterial: 48.3 mmHg — ABNORMAL HIGH (ref 32.0–48.0)
pCO2 arterial: 43.4 mmHg (ref 32.0–48.0)
pH, Arterial: 7.361 (ref 7.350–7.450)
pH, Arterial: 7.243 — ABNORMAL LOW (ref 7.350–7.450)
pH, Arterial: 7.176 — CL (ref 7.350–7.450)
pH, Arterial: 7.276 — ABNORMAL LOW (ref 7.350–7.450)
pH, Arterial: 7.296 — ABNORMAL LOW (ref 7.350–7.450)
pH, Arterial: 7.137 — CL (ref 7.350–7.450)
pH, Arterial: 7.222 — ABNORMAL LOW (ref 7.350–7.450)
pO2, Arterial: 145 mmHg — ABNORMAL HIGH (ref 83.0–108.0)
pO2, Arterial: 303 mmHg — ABNORMAL HIGH (ref 83.0–108.0)
pO2, Arterial: 212 mmHg — ABNORMAL HIGH (ref 83.0–108.0)
pO2, Arterial: 218 mmHg — ABNORMAL HIGH (ref 83.0–108.0)
pO2, Arterial: 504 mmHg — ABNORMAL HIGH (ref 83.0–108.0)
pO2, Arterial: 132 mmHg — ABNORMAL HIGH (ref 83.0–108.0)
pO2, Arterial: 135 mmHg — ABNORMAL HIGH (ref 83.0–108.0)

## 2018-04-25 LAB — BASIC METABOLIC PANEL
Anion gap: 20 — ABNORMAL HIGH (ref 5–15)
BUN: 11 mg/dL (ref 6–20)
CO2: 16 mmol/L — ABNORMAL LOW (ref 22–32)
Calcium: 7.9 mg/dL — ABNORMAL LOW (ref 8.9–10.3)
Chloride: 105 mmol/L (ref 98–111)
Creatinine, Ser: 1.11 mg/dL — ABNORMAL HIGH (ref 0.44–1.00)
GFR calc Af Amer: >60 mL/min (ref >60)
GFR calc non Af Amer: 58 mL/min — ABNORMAL LOW (ref >60)
Glucose, Bld: 239 mg/dL — ABNORMAL HIGH (ref 70–99)
Potassium: 4.3 mmol/L (ref 3.5–5.1)
Sodium: 141 mmol/L (ref 135–145)

## 2018-04-25 LAB — CBC
HCT: 39 % (ref 36.0–46.0)
Hemoglobin: 12.4 g/dL (ref 12.0–15.0)
MCH: 22.5 pg — ABNORMAL LOW (ref 26.0–34.0)
MCHC: 31.8 g/dL (ref 30.0–36.0)
MCV: 70.7 fL — ABNORMAL LOW (ref 80.0–100.0)
Platelets: 190 10*3/uL (ref 150–400)
RBC: 5.52 MIL/uL — ABNORMAL HIGH (ref 3.87–5.11)
RDW: 17 % — ABNORMAL HIGH (ref 11.5–15.5)
WBC: 17.6 10*3/uL — ABNORMAL HIGH (ref 4.0–10.5)
nRBC: 0.4 % — ABNORMAL HIGH (ref 0.0–0.2)

## 2018-04-25 LAB — PREPARE RBC (CROSSMATCH)

## 2018-04-25 LAB — COMPREHENSIVE METABOLIC PANEL
ALT: 168 U/L — ABNORMAL HIGH (ref 0–44)
AST: 200 U/L — ABNORMAL HIGH (ref 15–41)
Albumin: 3.5 g/dL (ref 3.5–5.0)
Alkaline Phosphatase: 200 U/L — ABNORMAL HIGH (ref 38–126)
Anion gap: 18 — ABNORMAL HIGH (ref 5–15)
BUN: 9 mg/dL (ref 6–20)
CO2: 17 mmol/L — ABNORMAL LOW (ref 22–32)
Calcium: 8.5 mg/dL — ABNORMAL LOW (ref 8.9–10.3)
Chloride: 105 mmol/L (ref 98–111)
Creatinine, Ser: 1.17 mg/dL — ABNORMAL HIGH (ref 0.44–1.00)
GFR calc Af Amer: >60 mL/min (ref >60)
GFR calc non Af Amer: 55 mL/min — ABNORMAL LOW (ref >60)
Glucose, Bld: 246 mg/dL — ABNORMAL HIGH (ref 70–99)
Potassium: 4.6 mmol/L (ref 3.5–5.1)
Sodium: 140 mmol/L (ref 135–145)
Total Bilirubin: 2.1 mg/dL — ABNORMAL HIGH (ref 0.3–1.2)
Total Protein: 5.2 g/dL — ABNORMAL LOW (ref 6.5–8.1)

## 2018-04-25 LAB — DIC (DISSEMINATED INTRAVASCULAR COAGULATION)PANEL
D-Dimer, Quant: 8.28 ug/mL-FEU — ABNORMAL HIGH (ref 0.00–0.50)
Fibrinogen: 312 mg/dL (ref 210–475)
INR: 1.4 — ABNORMAL HIGH (ref 0.8–1.2)
Platelets: 173 10*3/uL (ref 150–400)
Prothrombin Time: 17.3 seconds — ABNORMAL HIGH (ref 11.4–15.2)
Smear Review: NONE SEEN
aPTT: 38 seconds — ABNORMAL HIGH (ref 24–36)

## 2018-04-25 LAB — GLUCOSE, CAPILLARY
Glucose-Capillary: 219 mg/dL — ABNORMAL HIGH (ref 70–99)
Glucose-Capillary: 126 mg/dL — ABNORMAL HIGH (ref 70–99)
Glucose-Capillary: 70 mg/dL (ref 70–99)

## 2018-04-25 LAB — PHOSPHORUS: Phosphorus: 9.4 mg/dL — ABNORMAL HIGH (ref 2.5–4.6)

## 2018-04-25 LAB — POCT PREGNANCY, URINE: Preg Test, Ur: NEGATIVE

## 2018-04-25 LAB — MAGNESIUM: Magnesium: 1.9 mg/dL (ref 1.7–2.4)

## 2018-04-25 LAB — HEMOGLOBIN AND HEMATOCRIT, BLOOD
HCT: 32.4 % — ABNORMAL LOW (ref 36.0–46.0)
Hemoglobin: 10.1 g/dL — ABNORMAL LOW (ref 12.0–15.0)

## 2018-04-25 LAB — MRSA PCR SCREENING: MRSA by PCR: NEGATIVE

## 2018-04-25 LAB — APTT: aPTT: 32 seconds (ref 24–36)

## 2018-04-25 LAB — PROTIME-INR
INR: 1.3 — ABNORMAL HIGH (ref 0.8–1.2)
Prothrombin Time: 15.7 seconds — ABNORMAL HIGH (ref 11.4–15.2)

## 2018-04-25 SURGERY — LAPAROSCOPY, DIAGNOSTIC
Anesthesia: General | Site: Abdomen

## 2018-04-25 MED ORDER — GABAPENTIN 300 MG PO CAPS
300.0000 mg | ORAL_CAPSULE | ORAL | Status: AC
  Administered 2018-04-25: 300 mg via ORAL
  Filled 2018-04-25: qty 1

## 2018-04-25 MED ORDER — CALCIUM CHLORIDE 10 % IV SOLN
INTRAVENOUS | Status: AC
  Filled 2018-04-25: qty 10

## 2018-04-25 MED ORDER — LIDOCAINE 2% (20 MG/ML) 5 ML SYRINGE
INTRAMUSCULAR | Status: DC | PRN
  Administered 2018-04-25: 100 mg via INTRAVENOUS
  Administered 2018-04-25: 70 mg via INTRAVENOUS
  Administered 2018-04-25: 100 mg via INTRAVENOUS
  Administered 2018-04-25 (×2): 40 mg via INTRAVENOUS

## 2018-04-25 MED ORDER — ACETAMINOPHEN 325 MG PO TABS
650.0000 mg | ORAL_TABLET | Freq: Four times a day (QID) | ORAL | Status: DC | PRN
  Administered 2018-04-29 – 2018-05-02 (×2): 650 mg via ORAL
  Filled 2018-04-25 (×2): qty 2

## 2018-04-25 MED ORDER — ONDANSETRON HCL 4 MG/2ML IJ SOLN
INTRAMUSCULAR | Status: AC
  Filled 2018-04-25: qty 2

## 2018-04-25 MED ORDER — ROCURONIUM BROMIDE 50 MG/5ML IV SOSY
PREFILLED_SYRINGE | INTRAVENOUS | Status: AC
  Filled 2018-04-25: qty 10

## 2018-04-25 MED ORDER — MIDAZOLAM HCL 2 MG/2ML IJ SOLN
2.0000 mg | INTRAMUSCULAR | Status: DC | PRN
  Administered 2018-04-25 – 2018-04-26 (×2): 2 mg via INTRAVENOUS
  Filled 2018-04-25 (×2): qty 2

## 2018-04-25 MED ORDER — CHLORHEXIDINE GLUCONATE CLOTH 2 % EX PADS
6.0000 | MEDICATED_PAD | Freq: Every day | CUTANEOUS | Status: DC
  Administered 2018-04-26 – 2018-05-01 (×6): 6 via TOPICAL

## 2018-04-25 MED ORDER — SODIUM CHLORIDE 0.9% FLUSH: 3.0000 mL | INTRAVENOUS | Status: DC | PRN

## 2018-04-25 MED ORDER — NOREPINEPHRINE BITARTRATE 1 MG/ML IV SOLN
INTRAVENOUS | Status: DC | PRN
  Administered 2018-04-25: 15:00:00 4 ug/min via INTRAVENOUS
  Administered 2018-04-25: 15:00:00 2 ug/min via INTRAVENOUS

## 2018-04-25 MED ORDER — EPINEPHRINE 1 MG/10ML IJ SOSY
PREFILLED_SYRINGE | INTRAMUSCULAR | Status: AC
  Filled 2018-04-25: qty 10

## 2018-04-25 MED ORDER — NOREPINEPHRINE 4 MG/250ML-% IV SOLN
0.0000 ug/min | INTRAVENOUS | Status: DC
  Administered 2018-04-25: 2 ug/min via INTRAVENOUS

## 2018-04-25 MED ORDER — ROCURONIUM BROMIDE 50 MG/5ML IV SOSY
PREFILLED_SYRINGE | INTRAVENOUS | Status: AC
  Filled 2018-04-25: qty 5

## 2018-04-25 MED ORDER — ONDANSETRON HCL 4 MG/2ML IJ SOLN
INTRAMUSCULAR | Status: DC | PRN
  Administered 2018-04-25: 4 mg via INTRAVENOUS

## 2018-04-25 MED ORDER — DIPHENHYDRAMINE HCL 50 MG/ML IJ SOLN: 12.5000 mg | INTRAMUSCULAR | Status: DC | PRN

## 2018-04-25 MED ORDER — PROPOFOL 10 MG/ML IV BOLUS
INTRAVENOUS | Status: AC
  Filled 2018-04-25: qty 40

## 2018-04-25 MED ORDER — CEFAZOLIN SODIUM-DEXTROSE 2-4 GM/100ML-% IV SOLN
2.0000 g | INTRAVENOUS | Status: AC
  Administered 2018-04-25 (×2): 2 g via INTRAVENOUS
  Filled 2018-04-25: qty 100

## 2018-04-25 MED ORDER — NALBUPHINE HCL 10 MG/ML IJ SOLN
5.0000 mg | INTRAMUSCULAR | Status: DC | PRN
  Filled 2018-04-25: qty 0.5

## 2018-04-25 MED ORDER — FENTANYL CITRATE (PF) 100 MCG/2ML IJ SOLN
50.0000 ug | INTRAMUSCULAR | Status: DC | PRN
  Administered 2018-04-25 – 2018-04-28 (×21): 50 ug via INTRAVENOUS
  Filled 2018-04-25 (×20): qty 2

## 2018-04-25 MED ORDER — ACETAMINOPHEN 650 MG RE SUPP: 650.0000 mg | Freq: Four times a day (QID) | RECTAL | Status: DC | PRN

## 2018-04-25 MED ORDER — ONDANSETRON HCL 4 MG/2ML IJ SOLN
4.0000 mg | Freq: Four times a day (QID) | INTRAMUSCULAR | Status: DC | PRN
  Administered 2018-05-14 – 2018-05-16 (×5): 4 mg via INTRAVENOUS
  Filled 2018-04-25 (×6): qty 2

## 2018-04-25 MED ORDER — PHENYLEPHRINE 40 MCG/ML (10ML) SYRINGE FOR IV PUSH (FOR BLOOD PRESSURE SUPPORT)
PREFILLED_SYRINGE | INTRAVENOUS | Status: DC | PRN
  Administered 2018-04-25: 200 ug via INTRAVENOUS
  Administered 2018-04-25 (×2): 80 ug via INTRAVENOUS
  Administered 2018-04-25: 200 ug via INTRAVENOUS
  Administered 2018-04-25: 120 ug via INTRAVENOUS

## 2018-04-25 MED ORDER — PROCHLORPERAZINE MALEATE 10 MG PO TABS
10.0000 mg | ORAL_TABLET | Freq: Four times a day (QID) | ORAL | Status: DC | PRN
  Filled 2018-04-25: qty 1

## 2018-04-25 MED ORDER — PHENYLEPHRINE HCL-NACL 10-0.9 MG/250ML-% IV SOLN
0.0000 ug/min | INTRAVENOUS | Status: DC
  Administered 2018-04-25: 20 ug/min via INTRAVENOUS

## 2018-04-25 MED ORDER — MIDAZOLAM HCL 2 MG/2ML IJ SOLN
2.0000 mg | INTRAMUSCULAR | Status: DC | PRN
  Administered 2018-04-26: 2 mg via INTRAVENOUS
  Filled 2018-04-25 (×2): qty 2

## 2018-04-25 MED ORDER — WATER FOR IRRIGATION, STERILE IR SOLN
Status: DC | PRN
  Administered 2018-04-25: 1000 mL

## 2018-04-25 MED ORDER — ROCURONIUM BROMIDE 10 MG/ML (PF) SYRINGE
PREFILLED_SYRINGE | INTRAVENOUS | Status: DC | PRN
  Administered 2018-04-25 (×3): 20 mg via INTRAVENOUS
  Administered 2018-04-25: 10 mg via INTRAVENOUS
  Administered 2018-04-25 (×2): 20 mg via INTRAVENOUS
  Administered 2018-04-25: 50 mg via INTRAVENOUS
  Administered 2018-04-25 (×2): 20 mg via INTRAVENOUS

## 2018-04-25 MED ORDER — 0.9 % SODIUM CHLORIDE (POUR BTL) OPTIME
TOPICAL | Status: DC | PRN
  Administered 2018-04-25 (×4): 1000 mL

## 2018-04-25 MED ORDER — ALBUMIN HUMAN 25 % IV SOLN
12.5000 g | Freq: Once | INTRAVENOUS | Status: DC
  Filled 2018-04-25: qty 100

## 2018-04-25 MED ORDER — NOREPINEPHRINE 4 MG/250ML-% IV SOLN
0.0000 ug/min | INTRAVENOUS | Status: DC
  Filled 2018-04-25: qty 250

## 2018-04-25 MED ORDER — DIPHENHYDRAMINE HCL 12.5 MG/5ML PO ELIX: 12.5000 mg | ORAL_SOLUTION | Freq: Four times a day (QID) | ORAL | Status: DC | PRN

## 2018-04-25 MED ORDER — PHENYLEPHRINE HCL-NACL 10-0.9 MG/250ML-% IV SOLN: 0.0000 ug/min | INTRAVENOUS | Status: DC

## 2018-04-25 MED ORDER — NALBUPHINE HCL 10 MG/ML IJ SOLN
5.0000 mg | Freq: Once | INTRAMUSCULAR | Status: DC | PRN
  Filled 2018-04-25: qty 0.5

## 2018-04-25 MED ORDER — ENSURE PRE-SURGERY PO LIQD
592.0000 mL | Freq: Once | ORAL | Status: DC
  Filled 2018-04-25: qty 592

## 2018-04-25 MED ORDER — LIDOCAINE HCL 1 % IJ SOLN
INTRAMUSCULAR | Status: DC | PRN
  Administered 2018-04-25: 4 mL via SUBCUTANEOUS

## 2018-04-25 MED ORDER — CALCIUM CHLORIDE 10 % IV SOLN
INTRAVENOUS | Status: DC | PRN
  Administered 2018-04-25: 300 mg via INTRAVENOUS
  Administered 2018-04-25: 200 mg via INTRAVENOUS

## 2018-04-25 MED ORDER — DIPHENHYDRAMINE HCL 25 MG PO CAPS: 25.0000 mg | ORAL_CAPSULE | ORAL | Status: DC | PRN

## 2018-04-25 MED ORDER — SODIUM CHLORIDE 0.9% IV SOLUTION: Freq: Once | INTRAVENOUS | Status: DC

## 2018-04-25 MED ORDER — SODIUM CHLORIDE 0.9% FLUSH: 10.0000 mL | INTRAVENOUS | Status: DC | PRN

## 2018-04-25 MED ORDER — CEFAZOLIN SODIUM-DEXTROSE 2-4 GM/100ML-% IV SOLN
2.0000 g | Freq: Three times a day (TID) | INTRAVENOUS | Status: AC
  Administered 2018-04-25: 2 g via INTRAVENOUS
  Filled 2018-04-25: qty 100

## 2018-04-25 MED ORDER — FENTANYL CITRATE (PF) 100 MCG/2ML IJ SOLN
INTRAMUSCULAR | Status: DC | PRN
  Administered 2018-04-25 (×3): 50 ug via INTRAVENOUS
  Administered 2018-04-25: 100 ug via INTRAVENOUS

## 2018-04-25 MED ORDER — SODIUM CHLORIDE 0.9% FLUSH
10.0000 mL | Freq: Two times a day (BID) | INTRAVENOUS | Status: DC
  Administered 2018-04-26 – 2018-04-28 (×3): 10 mL
  Administered 2018-04-28: 20 mL
  Administered 2018-04-29 (×2): 10 mL
  Administered 2018-04-30 – 2018-05-01 (×2): 20 mL
  Administered 2018-05-02: 11:00:00 10 mL
  Administered 2018-05-02: 20 mL
  Administered 2018-05-03 – 2018-05-06 (×6): 10 mL
  Administered 2018-05-07: 22:00:00 20 mL
  Administered 2018-05-08 – 2018-05-12 (×7): 10 mL
  Administered 2018-05-13: 20 mL
  Administered 2018-05-13 – 2018-05-16 (×6): 10 mL

## 2018-04-25 MED ORDER — SODIUM CHLORIDE 0.9% IV SOLUTION
Freq: Once | INTRAVENOUS | Status: AC
  Administered 2018-04-26: 08:00:00 via INTRAVENOUS

## 2018-04-25 MED ORDER — ENSURE PRE-SURGERY PO LIQD
296.0000 mL | Freq: Once | ORAL | Status: DC
  Filled 2018-04-25: qty 296

## 2018-04-25 MED ORDER — PHENYLEPHRINE 40 MCG/ML (10ML) SYRINGE FOR IV PUSH (FOR BLOOD PRESSURE SUPPORT)
PREFILLED_SYRINGE | INTRAVENOUS | Status: AC
  Filled 2018-04-25: qty 40

## 2018-04-25 MED ORDER — LACTATED RINGERS IV SOLN
INTRAVENOUS | Status: DC | PRN
  Administered 2018-04-25 (×2): via INTRAVENOUS

## 2018-04-25 MED ORDER — SCOPOLAMINE 1 MG/3DAYS TD PT72
1.0000 | MEDICATED_PATCH | TRANSDERMAL | Status: DC
  Administered 2018-04-25: 1.5 mg via TRANSDERMAL
  Filled 2018-04-25: qty 1

## 2018-04-25 MED ORDER — LACTATED RINGERS IV SOLN
INTRAVENOUS | Status: DC | PRN
  Administered 2018-04-25 (×2): via INTRAVENOUS

## 2018-04-25 MED ORDER — ACETAMINOPHEN 500 MG PO TABS
1000.0000 mg | ORAL_TABLET | ORAL | Status: AC
  Administered 2018-04-25: 1000 mg via ORAL
  Filled 2018-04-25: qty 2

## 2018-04-25 MED ORDER — NALOXONE HCL 4 MG/10ML IJ SOLN
1.0000 ug/kg/h | INTRAVENOUS | Status: DC | PRN
  Filled 2018-04-25: qty 5

## 2018-04-25 MED ORDER — ONDANSETRON 4 MG PO TBDP: 4.0000 mg | ORAL_TABLET | Freq: Four times a day (QID) | ORAL | Status: DC | PRN

## 2018-04-25 MED ORDER — PROPOFOL 10 MG/ML IV BOLUS
INTRAVENOUS | Status: DC | PRN
  Administered 2018-04-25 (×2): 20 mg via INTRAVENOUS
  Administered 2018-04-25: 170 mg via INTRAVENOUS

## 2018-04-25 MED ORDER — ALBUMIN HUMAN 5 % IV SOLN
INTRAVENOUS | Status: DC | PRN
  Administered 2018-04-25: 09:00:00 via INTRAVENOUS

## 2018-04-25 MED ORDER — DEXTROSE IN LACTATED RINGERS 5 % IV SOLN
INTRAVENOUS | Status: DC
  Administered 2018-04-26: 18:00:00 via INTRAVENOUS

## 2018-04-25 MED ORDER — PROPOFOL 1000 MG/100ML IV EMUL
INTRAVENOUS | Status: AC
  Filled 2018-04-25: qty 100

## 2018-04-25 MED ORDER — HEMOSTATIC AGENTS (NO CHARGE) OPTIME
TOPICAL | Status: DC | PRN
  Administered 2018-04-25: 1 via TOPICAL

## 2018-04-25 MED ORDER — LIDOCAINE HCL 1 % IJ SOLN
INTRAMUSCULAR | Status: AC
  Filled 2018-04-25: qty 20

## 2018-04-25 MED ORDER — CEFAZOLIN SODIUM 1 G IJ SOLR
INTRAMUSCULAR | Status: AC
  Filled 2018-04-25: qty 20

## 2018-04-25 MED ORDER — PROCHLORPERAZINE EDISYLATE 10 MG/2ML IJ SOLN
5.0000 mg | Freq: Four times a day (QID) | INTRAMUSCULAR | Status: DC | PRN
  Administered 2018-05-11: 10 mg via INTRAVENOUS
  Administered 2018-05-11: 5 mg via INTRAVENOUS
  Administered 2018-05-12 – 2018-05-13 (×3): 10 mg via INTRAVENOUS
  Filled 2018-04-25 (×6): qty 2

## 2018-04-25 MED ORDER — HYDRALAZINE HCL 20 MG/ML IJ SOLN
10.0000 mg | INTRAMUSCULAR | Status: DC | PRN
  Administered 2018-04-27 – 2018-05-07 (×3): 10 mg via INTRAVENOUS
  Filled 2018-04-25 (×3): qty 1

## 2018-04-25 MED ORDER — DEXAMETHASONE SODIUM PHOSPHATE 10 MG/ML IJ SOLN
INTRAMUSCULAR | Status: AC
  Filled 2018-04-25: qty 1

## 2018-04-25 MED ORDER — BUPIVACAINE-EPINEPHRINE (PF) 0.25% -1:200000 IJ SOLN
INTRAMUSCULAR | Status: AC
  Filled 2018-04-25: qty 30

## 2018-04-25 MED ORDER — SODIUM BICARBONATE 8.4 % IV SOLN
INTRAVENOUS | Status: DC | PRN
  Administered 2018-04-25 (×2): 50 mL via INTRAVENOUS

## 2018-04-25 MED ORDER — SCOPOLAMINE 1 MG/3DAYS TD PT72
1.0000 | MEDICATED_PATCH | Freq: Once | TRANSDERMAL | Status: AC
  Administered 2018-04-25: 1.5 mg via TRANSDERMAL
  Filled 2018-04-25: qty 1

## 2018-04-25 MED ORDER — MIDAZOLAM HCL 2 MG/2ML IJ SOLN
INTRAMUSCULAR | Status: DC | PRN
  Administered 2018-04-25: 2 mg via INTRAVENOUS

## 2018-04-25 MED ORDER — PHENYLEPHRINE HCL-NACL 10-0.9 MG/250ML-% IV SOLN
INTRAVENOUS | Status: AC
  Filled 2018-04-25: qty 250

## 2018-04-25 MED ORDER — INSULIN ASPART 100 UNIT/ML ~~LOC~~ SOLN
0.0000 [IU] | SUBCUTANEOUS | Status: DC
  Administered 2018-04-25: 3 [IU] via SUBCUTANEOUS
  Administered 2018-04-25 – 2018-04-27 (×3): 1 [IU] via SUBCUTANEOUS
  Administered 2018-04-27: 2 [IU] via SUBCUTANEOUS
  Administered 2018-04-29: 1 [IU] via SUBCUTANEOUS
  Administered 2018-04-29 – 2018-04-30 (×2): 2 [IU] via SUBCUTANEOUS
  Administered 2018-04-30: 1 [IU] via SUBCUTANEOUS
  Administered 2018-04-30: 2 [IU] via SUBCUTANEOUS
  Administered 2018-04-30: 1 [IU] via SUBCUTANEOUS
  Administered 2018-05-01: 2 [IU] via SUBCUTANEOUS
  Administered 2018-05-01 – 2018-05-04 (×8): 1 [IU] via SUBCUTANEOUS
  Administered 2018-05-04: 12:00:00 2 [IU] via SUBCUTANEOUS
  Administered 2018-05-04 – 2018-05-05 (×3): 1 [IU] via SUBCUTANEOUS
  Administered 2018-05-05 (×2): 2 [IU] via SUBCUTANEOUS
  Administered 2018-05-05 – 2018-05-06 (×5): 1 [IU] via SUBCUTANEOUS
  Administered 2018-05-06: 2 [IU] via SUBCUTANEOUS
  Administered 2018-05-07 (×5): 1 [IU] via SUBCUTANEOUS
  Administered 2018-05-08: 13:00:00 2 [IU] via SUBCUTANEOUS
  Administered 2018-05-08 – 2018-05-10 (×7): 1 [IU] via SUBCUTANEOUS
  Administered 2018-05-10: 2 [IU] via SUBCUTANEOUS
  Administered 2018-05-10 – 2018-05-12 (×4): 1 [IU] via SUBCUTANEOUS
  Administered 2018-05-12: 2 [IU] via SUBCUTANEOUS
  Administered 2018-05-12 – 2018-05-13 (×2): 1 [IU] via SUBCUTANEOUS
  Administered 2018-05-13: 2 [IU] via SUBCUTANEOUS

## 2018-04-25 MED ORDER — MORPHINE SULFATE (PF) 2 MG/ML IV SOLN
1.0000 mg | INTRAVENOUS | Status: DC | PRN
  Administered 2018-04-25 – 2018-04-26 (×2): 2 mg via INTRAVENOUS
  Administered 2018-04-26 (×3): 4 mg via INTRAVENOUS
  Administered 2018-04-26: 2 mg via INTRAVENOUS
  Administered 2018-04-26: 4 mg via INTRAVENOUS
  Administered 2018-04-27: 2 mg via INTRAVENOUS
  Administered 2018-04-27: 4 mg via INTRAVENOUS
  Administered 2018-04-27: 2 mg via INTRAVENOUS
  Administered 2018-04-27 – 2018-04-28 (×8): 4 mg via INTRAVENOUS
  Filled 2018-04-25 (×9): qty 2
  Filled 2018-04-25: qty 1
  Filled 2018-04-25 (×3): qty 2
  Filled 2018-04-25: qty 1
  Filled 2018-04-25 (×2): qty 2
  Filled 2018-04-25: qty 1
  Filled 2018-04-25: qty 2
  Filled 2018-04-25: qty 1

## 2018-04-25 MED ORDER — METHOCARBAMOL 1000 MG/10ML IJ SOLN
500.0000 mg | Freq: Four times a day (QID) | INTRAVENOUS | Status: DC | PRN
  Administered 2018-04-27: 500 mg via INTRAVENOUS
  Filled 2018-04-25: qty 500
  Filled 2018-04-25: qty 5

## 2018-04-25 MED ORDER — SODIUM CHLORIDE 0.9 % IV SOLN
INTRAVENOUS | Status: DC | PRN
  Administered 2018-04-25: 14:00:00 via INTRAVENOUS
  Administered 2018-04-25: 5 ug/min via INTRAVENOUS

## 2018-04-25 MED ORDER — CHLORHEXIDINE GLUCONATE CLOTH 2 % EX PADS: 6.0000 | MEDICATED_PAD | Freq: Once | CUTANEOUS | Status: DC

## 2018-04-25 MED ORDER — SUCCINYLCHOLINE CHLORIDE 200 MG/10ML IV SOSY
PREFILLED_SYRINGE | INTRAVENOUS | Status: AC
  Filled 2018-04-25: qty 10

## 2018-04-25 MED ORDER — PANTOPRAZOLE SODIUM 40 MG IV SOLR
40.0000 mg | Freq: Every day | INTRAVENOUS | Status: DC
  Administered 2018-04-25 – 2018-05-09 (×15): 40 mg via INTRAVENOUS
  Filled 2018-04-25 (×16): qty 40

## 2018-04-25 MED ORDER — ROPIVACAINE HCL 2 MG/ML IJ SOLN
INTRAMUSCULAR | Status: DC | PRN
  Administered 2018-04-25: 7 mL/h via EPIDURAL

## 2018-04-25 MED ORDER — LIDOCAINE 2% (20 MG/ML) 5 ML SYRINGE
INTRAMUSCULAR | Status: AC
  Filled 2018-04-25: qty 5

## 2018-04-25 MED ORDER — PROPOFOL 500 MG/50ML IV EMUL
INTRAVENOUS | Status: DC | PRN
  Administered 2018-04-25: 20 ug/kg/min via INTRAVENOUS

## 2018-04-25 MED ORDER — SODIUM BICARBONATE 8.4 % IV SOLN
INTRAVENOUS | Status: DC
  Administered 2018-04-25 – 2018-04-26 (×2): via INTRAVENOUS
  Filled 2018-04-25 (×4): qty 150

## 2018-04-25 MED ORDER — EPINEPHRINE 1 MG/10ML IJ SOSY
PREFILLED_SYRINGE | INTRAMUSCULAR | Status: DC | PRN
  Administered 2018-04-25: 0.2 mg via INTRAVENOUS

## 2018-04-25 MED ORDER — ENSURE SURGERY PO LIQD: 237.0000 mL | Freq: Two times a day (BID) | ORAL | Status: DC

## 2018-04-25 MED ORDER — NALOXONE HCL 0.4 MG/ML IJ SOLN: 0.4000 mg | INTRAMUSCULAR | Status: DC | PRN

## 2018-04-25 MED ORDER — BISACODYL 10 MG RE SUPP: 10.0000 mg | Freq: Every day | RECTAL | Status: DC | PRN

## 2018-04-25 MED ORDER — MIDAZOLAM HCL 2 MG/2ML IJ SOLN
INTRAMUSCULAR | Status: AC
  Filled 2018-04-25: qty 2

## 2018-04-25 MED ORDER — SENNOSIDES 8.8 MG/5ML PO SYRP: 5.0000 mL | ORAL_SOLUTION | Freq: Two times a day (BID) | ORAL | Status: DC | PRN

## 2018-04-25 MED ORDER — ALBUMIN HUMAN 25 % IV SOLN
25.0000 g | Freq: Once | INTRAVENOUS | Status: AC
  Administered 2018-04-25: 25 g via INTRAVENOUS

## 2018-04-25 MED ORDER — PROPOFOL 1000 MG/100ML IV EMUL
5.0000 ug/kg/min | INTRAVENOUS | Status: DC
  Administered 2018-04-25 – 2018-04-26 (×2): 20 ug/kg/min via INTRAVENOUS
  Filled 2018-04-25 (×2): qty 100

## 2018-04-25 MED ORDER — ALBUMIN HUMAN 5 % IV SOLN
INTRAVENOUS | Status: AC
  Filled 2018-04-25: qty 250

## 2018-04-25 MED ORDER — FENTANYL CITRATE (PF) 250 MCG/5ML IJ SOLN
INTRAMUSCULAR | Status: AC
  Filled 2018-04-25: qty 5

## 2018-04-25 MED ORDER — DIPHENHYDRAMINE HCL 50 MG/ML IJ SOLN: 12.5000 mg | Freq: Four times a day (QID) | INTRAMUSCULAR | Status: DC | PRN

## 2018-04-25 MED ORDER — ALBUMIN HUMAN 25 % IV SOLN
25.0000 g | Freq: Four times a day (QID) | INTRAVENOUS | Status: AC
  Administered 2018-04-25 – 2018-04-27 (×8): 25 g via INTRAVENOUS
  Filled 2018-04-25: qty 100
  Filled 2018-04-25: qty 50
  Filled 2018-04-25 (×3): qty 100
  Filled 2018-04-25: qty 50
  Filled 2018-04-25: qty 100
  Filled 2018-04-25: qty 50

## 2018-04-25 MED ORDER — ONDANSETRON HCL 4 MG/2ML IJ SOLN: 4.0000 mg | Freq: Three times a day (TID) | INTRAMUSCULAR | Status: DC | PRN

## 2018-04-25 MED ORDER — IOHEXOL 350 MG/ML SOLN
100.0000 mL | Freq: Once | INTRAVENOUS | Status: AC | PRN
  Administered 2018-04-25: 45 mL via INTRAVENOUS

## 2018-04-25 MED ORDER — DEXAMETHASONE SODIUM PHOSPHATE 10 MG/ML IJ SOLN
INTRAMUSCULAR | Status: DC | PRN
  Administered 2018-04-25: 4 mg via INTRAVENOUS

## 2018-04-25 SURGICAL SUPPLY — 109 items
BAG BILE T-TUBES STRL (MISCELLANEOUS) ×6 IMPLANT
BIOPATCH RED 1 DISK 7.0 (GAUZE/BANDAGES/DRESSINGS) ×4 IMPLANT
BIOPATCH RED 1IN DISK 7.0MM (GAUZE/BANDAGES/DRESSINGS) ×2
BOOT SUTURE AID YELLOW STND (SUTURE) ×6 IMPLANT
CANISTER SUCT 3000ML PPV (MISCELLANEOUS) ×3 IMPLANT
CATH KIT ON Q 7.5IN SLV (PAIN MANAGEMENT) IMPLANT
CATH ROBINSON RED A/P 16FR (CATHETERS) IMPLANT
CHLORAPREP W/TINT 26 (MISCELLANEOUS) ×3 IMPLANT
CLIP VESOCCLUDE LG 6/CT (CLIP) ×15 IMPLANT
CLIP VESOCCLUDE MED 24/CT (CLIP) ×3 IMPLANT
CLIP VESOLOCK LG 6/CT PURPLE (CLIP) ×6 IMPLANT
CLIP VESOLOCK MED 6/CT (CLIP) ×3 IMPLANT
CLIP VESOLOCK MED LG 6/CT (CLIP) ×9 IMPLANT
CONT SPEC 4OZ CLIKSEAL STRL BL (MISCELLANEOUS) IMPLANT
COVER MAYO STAND STRL (DRAPES) ×3 IMPLANT
COVER SURGICAL LIGHT HANDLE (MISCELLANEOUS) ×3 IMPLANT
DECANTER SPIKE VIAL GLASS SM (MISCELLANEOUS) IMPLANT
DERMABOND ADVANCED (GAUZE/BANDAGES/DRESSINGS) ×2
DERMABOND ADVANCED .7 DNX12 (GAUZE/BANDAGES/DRESSINGS) ×1 IMPLANT
DRAIN CHANNEL 19F RND (DRAIN) IMPLANT
DRAPE LAPAROSCOPIC ABDOMINAL (DRAPES) ×3 IMPLANT
DRAPE WARM FLUID 44X44 (DRAPE) ×6 IMPLANT
DRSG COVADERM 4X10 (GAUZE/BANDAGES/DRESSINGS) IMPLANT
DRSG COVADERM 4X14 (GAUZE/BANDAGES/DRESSINGS) ×3 IMPLANT
DRSG COVADERM 4X6 (GAUZE/BANDAGES/DRESSINGS) IMPLANT
DRSG COVADERM 4X8 (GAUZE/BANDAGES/DRESSINGS) IMPLANT
DRSG TEGADERM 2-3/8X2-3/4 SM (GAUZE/BANDAGES/DRESSINGS) ×3 IMPLANT
DRSG TEGADERM 4X4.75 (GAUZE/BANDAGES/DRESSINGS) ×3 IMPLANT
DRSG TELFA 3X8 NADH (GAUZE/BANDAGES/DRESSINGS) ×3 IMPLANT
ELECT BLADE 4.0 EZ CLEAN MEGAD (MISCELLANEOUS)
ELECT BLADE 6.5 EXT (BLADE) ×3 IMPLANT
ELECT CAUTERY BLADE 6.4 (BLADE) ×3 IMPLANT
ELECT REM PT RETURN 9FT ADLT (ELECTROSURGICAL) ×3
ELECTRODE BLDE 4.0 EZ CLN MEGD (MISCELLANEOUS) IMPLANT
ELECTRODE REM PT RTRN 9FT ADLT (ELECTROSURGICAL) ×1 IMPLANT
GAUZE 4X4 16PLY RFD (DISPOSABLE) IMPLANT
GAUZE SPONGE 4X4 12PLY STRL (GAUZE/BANDAGES/DRESSINGS) ×3 IMPLANT
GEL ULTRASOUND 20GR AQUASONIC (MISCELLANEOUS) ×3 IMPLANT
GLOVE BIOGEL PI IND STRL 6.5 (GLOVE) ×6 IMPLANT
GLOVE BIOGEL PI IND STRL 7.0 (GLOVE) ×2 IMPLANT
GLOVE BIOGEL PI IND STRL 7.5 (GLOVE) ×3 IMPLANT
GLOVE BIOGEL PI INDICATOR 6.5 (GLOVE) ×12
GLOVE BIOGEL PI INDICATOR 7.0 (GLOVE) ×4
GLOVE BIOGEL PI INDICATOR 7.5 (GLOVE) ×6
GLOVE SURG SS PI 6.0 STRL IVOR (GLOVE) ×18 IMPLANT
GLOVE SURG SS PI 7.5 STRL IVOR (GLOVE) ×12 IMPLANT
GOWN STRL REUS W/ TWL LRG LVL3 (GOWN DISPOSABLE) ×6 IMPLANT
GOWN STRL REUS W/TWL 2XL LVL3 (GOWN DISPOSABLE) ×12 IMPLANT
GOWN STRL REUS W/TWL LRG LVL3 (GOWN DISPOSABLE) ×12
HEMOSTAT SURGICEL 2X14 (HEMOSTASIS) IMPLANT
KIT BASIN OR (CUSTOM PROCEDURE TRAY) ×3 IMPLANT
KIT MARKER MARGIN INK (KITS) ×3 IMPLANT
KIT TOURNIQUET VASCULAR (KITS) IMPLANT
KIT TUBE JEJUNAL 16FR (CATHETERS) ×3 IMPLANT
KIT TURNOVER KIT B (KITS) ×3 IMPLANT
L-HOOK LAP DISP 36CM (ELECTROSURGICAL) ×3
LHOOK LAP DISP 36CM (ELECTROSURGICAL) ×1 IMPLANT
LOOP VESSEL MAXI BLUE (MISCELLANEOUS) ×3 IMPLANT
LOOP VESSEL MINI RED (MISCELLANEOUS) ×3 IMPLANT
NS IRRIG 1000ML POUR BTL (IV SOLUTION) ×12 IMPLANT
PACK GENERAL/GYN (CUSTOM PROCEDURE TRAY) ×3 IMPLANT
PAD ARMBOARD 7.5X6 YLW CONV (MISCELLANEOUS) ×6 IMPLANT
PENCIL SMOKE EVACUATOR (MISCELLANEOUS) ×3 IMPLANT
PLUG CATH AND CAP STER (CATHETERS) IMPLANT
RELOAD PROXIMATE 75MM BLUE (ENDOMECHANICALS) ×9 IMPLANT
RELOAD PROXIMATE 75MM GREEN (ENDOMECHANICALS) IMPLANT
SCISSORS LAP 5X35 DISP (ENDOMECHANICALS) ×3 IMPLANT
SEPRAFILM PROCEDURAL PACK 3X5 (MISCELLANEOUS) IMPLANT
SET IRRIG TUBING LAPAROSCOPIC (IRRIGATION / IRRIGATOR) IMPLANT
SET TUBE SMOKE EVAC HIGH FLOW (TUBING) ×3 IMPLANT
SHEARS FOC LG CVD HARMONIC 17C (MISCELLANEOUS) ×3 IMPLANT
SLEEVE ENDOPATH XCEL 5M (ENDOMECHANICALS) ×3 IMPLANT
SLEEVE SUCTION 125 (MISCELLANEOUS) ×3 IMPLANT
SLEEVE SUCTION CATH 165 (SLEEVE) ×3 IMPLANT
SPONGE INTESTINAL PEANUT (DISPOSABLE) ×3 IMPLANT
SPONGE LAP 18X18 RF (DISPOSABLE) ×12 IMPLANT
SPONGE SURGIFOAM ABS GEL 100 (HEMOSTASIS) IMPLANT
STAPLER PROXIMATE 75MM BLUE (STAPLE) ×3 IMPLANT
STAPLER VISISTAT 35W (STAPLE) ×3 IMPLANT
SUCTION POOLE TIP (SUCTIONS) ×3 IMPLANT
SUT 5.0 PDS RB-1 (SUTURE) ×12
SUT ETHILON 2 0 FS 18 (SUTURE) ×9 IMPLANT
SUT ETHILON 2 LR (SUTURE) IMPLANT
SUT MNCRL AB 4-0 PS2 18 (SUTURE) IMPLANT
SUT PDS AB 1 TP1 96 (SUTURE) ×6 IMPLANT
SUT PDS AB 3-0 SH 27 (SUTURE) ×9 IMPLANT
SUT PDS AB 4-0 RB1 27 (SUTURE) ×36 IMPLANT
SUT PDS PLUS AB 5-0 RB-1 (SUTURE) ×6 IMPLANT
SUT PROLENE 3 0 SH 48 (SUTURE) ×6 IMPLANT
SUT PROLENE 4 0 RB 1 (SUTURE) ×14
SUT PROLENE 4-0 RB1 .5 CRCL 36 (SUTURE) ×7 IMPLANT
SUT PROLENE 5 0 RB 1 DA (SUTURE) IMPLANT
SUT SILK 2 0 SH CR/8 (SUTURE) ×6 IMPLANT
SUT SILK 2 0 TIES 10X30 (SUTURE) ×3 IMPLANT
SUT SILK 3 0 SH CR/8 (SUTURE) ×6 IMPLANT
SUT SILK 3 0 TIES 10X30 (SUTURE) ×3 IMPLANT
SUT VIC AB 2-0 CT1 27 (SUTURE) ×2
SUT VIC AB 2-0 CT1 TAPERPNT 27 (SUTURE) ×1 IMPLANT
SUT VIC AB 2-0 SH 18 (SUTURE) ×3 IMPLANT
SUT VIC AB 3-0 SH 18 (SUTURE) IMPLANT
SUT VIC AB 3-0 SH 27 (SUTURE) ×2
SUT VIC AB 3-0 SH 27X BRD (SUTURE) ×1 IMPLANT
SUT VIC AB 3-0 SH 8-18 (SUTURE) ×3 IMPLANT
SUT VICRYL AB 2 0 TIES (SUTURE) IMPLANT
TAPE UMBILICAL 1/8 X36 TWILL (MISCELLANEOUS) ×3 IMPLANT
TOWEL GREEN STERILE FF (TOWEL DISPOSABLE) ×3 IMPLANT
TROCAR XCEL NON-BLD 5MMX100MML (ENDOMECHANICALS) ×3 IMPLANT
TUBE FEEDING ENTERAL 5FR 16IN (TUBING) ×3 IMPLANT
TUNNELER SHEATH ON-Q 16GX12 DP (PAIN MANAGEMENT) IMPLANT

## 2018-04-25 NOTE — Anesthesia Procedure Notes (Signed)
Arterial Line Insertion Start/End4/09/2018 7:51 AM, 04/25/2018 8:00 AM Performed by: Verdie Drown, CRNA, CRNA  Patient location: OR. Preanesthetic checklist: patient identified, IV checked, site marked, risks and benefits discussed, surgical consent, monitors and equipment checked, pre-op evaluation, timeout performed and anesthesia consent Patient sedated Right, radial was placed Catheter size: 20 G Hand hygiene performed , maximum sterile barriers used  and Seldinger technique used Allen's test indicative of satisfactory collateral circulation Attempts: 2 Procedure performed without using ultrasound guided technique. Following insertion, dressing applied and Biopatch. Post procedure assessment: normal  Patient tolerated the procedure well with no immediate complications.

## 2018-04-25 NOTE — Progress Notes (Signed)
Day of Surgery   Subjective/Chief Complaint: Came to eval patient based on drain output and tachycardia.     Objective: Vital signs in last 24 hours: Temp:  [96 F (35.6 C)-98.5 F (36.9 C)] 97.9 F (36.6 C) (04/09 2000) Pulse Rate:  [85-172] 161 (04/09 2007) Resp:  [16-31] 27 (04/09 2100) BP: (73-149)/(55-120) 112/86 (04/09 2100) SpO2:  [71 %-100 %] 100 % (04/09 2007) Arterial Line BP: (80-175)/(51-109) 115/76 (04/09 2100) FiO2 (%):  [40 %] 40 % (04/09 2007) Weight:  [56.7 kg] 56.7 kg (04/09 0646)    Intake/Output from previous day: No intake/output data recorded. Intake/Output this shift: Total I/O In: 343.5 [I.V.:222.1; IV Piggyback:121.4] Out: -   General appearance: moving a little, trying to tongue out ET Tube.   Resp: synchronous on vent Cardio: regular, tachycardic GI: soft, distended, dressing c/d/i.  drains wtih bloody ascites, but serosanguinous.  Not frankly bloody.   Extremities: extremities normal, atraumatic, no cyanosis or edema Pulses: palpable  Lab Results:  Recent Labs    04/25/18 1539  04/25/18 1800 04/25/18 1802 04/25/18 1911 04/25/18 2017  WBC 17.6*  --  23.6*  --   --   --   HGB 12.4   < > 10.9* 11.6*  --  10.1*  HCT 39.0   < > 33.9* 34.0*  --  32.4*  PLT 190  --  170  --  173  --    < > = values in this interval not displayed.   BMET Recent Labs    04/25/18 1539  04/25/18 1800 04/25/18 1802  NA 140   < > 141 141  K 4.6   < > 4.3 4.2  CL 105  --  105  --   CO2 17*  --  16*  --   GLUCOSE 246*  --  239*  --   BUN 9  --  11  --   CREATININE 1.17*  --  1.11*  --   CALCIUM 8.5*  --  7.9*  --    < > = values in this interval not displayed.   PT/INR Recent Labs    04/25/18 1354 04/25/18 1911  LABPROT 15.7* 17.3*  INR 1.3* 1.4*   ABG Recent Labs    04/25/18 1554 04/25/18 1802  PHART 7.137* 7.222*  HCO3 16.5* 18.1*    Studies/Results: Dg Chest 1 View  Result Date: 04/25/2018 CLINICAL DATA:  Status post repositioning of  endotracheal tube. EXAM: CHEST  1 VIEW COMPARISON:  Single-view of the chest earlier today. FINDINGS: Endotracheal tube is been pulled back with the tip now 1.7 cm above the carina. No other change. IMPRESSION: As above. Electronically Signed   By: Inge Rise M.D.   On: 04/25/2018 14:57   Dg Chest 1 View  Result Date: 04/25/2018 CLINICAL DATA:  Status post ET tube placement. EXAM: CHEST  1 VIEW COMPARISON:  CT chest 11/15/2017. Single-view of the chest 11/08/2017. FINDINGS: Endotracheal tube is in place with the tip just within the right mainstem bronchus. NG tube tip is in good position in the stomach. Right Port-A-Cath noted. Lungs are clear. Heart size is normal. No pneumothorax or pleural effusion. Remote left rib fractures noted. IMPRESSION: ET tube is in the right mainstem bronchus. NG tube in good position. Lungs clear. Electronically Signed   By: Inge Rise M.D.   On: 04/25/2018 14:57   Ct Head Wo Contrast  Result Date: 04/25/2018 CLINICAL DATA:  Encephalopathy with sluggish pupils EXAM: CT HEAD WITHOUT CONTRAST TECHNIQUE:  Contiguous axial images were obtained from the base of the skull through the vertex without intravenous contrast. COMPARISON:  None. FINDINGS: Brain: The ventricles are normal in size and configuration. There is symmetric frontal atrophy bilaterally. There is no intracranial mass, hemorrhage, extra-axial fluid collection, or midline shift. Brain parenchyma appears unremarkable. No evident acute infarct. Vascular: There is no hyperdense vessel. There is no appreciable vascular calcification. Skull: Bony calvarium appears intact. Sinuses/Orbits: Visualized paranasal sinuses are clear. Visualized orbits appear symmetric bilaterally. Other: Mastoid air cells are clear. IMPRESSION: Frontal atrophy bilaterally. Ventricles normal in size and configuration. Brain parenchyma appears unremarkable. No acute infarct. No mass or hemorrhage. Electronically Signed   By: Lowella Grip III M.D.   On: 04/25/2018 19:06   Ct Angio Chest Pe W Or Wo Contrast  Addendum Date: 04/25/2018   ADDENDUM REPORT: 04/25/2018 19:49 ADDENDUM: Comment: By report, the mass has been removed from the head of the pancreas. 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 The stent is actually a pancreatic stent, no longer extending into the biliary duct system. The air in the biliary ductal system is felt to be due to the Whipple procedure and stent placement. Electronically Signed   By: Lowella Grip III M.D.   On: 04/25/2018 19:49   Result Date: 04/25/2018 CLINICAL DATA:  Hypoxia EXAM: CT ANGIOGRAPHY CHEST WITH CONTRAST TECHNIQUE: Multidetector CT imaging of the chest was performed using the standard protocol during bolus administration of intravenous contrast. Multiplanar CT image reconstructions and MIPs were obtained to evaluate the vascular anatomy. CONTRAST:  56m OMNIPAQUE IOHEXOL 350 MG/ML SOLN COMPARISON:  Chest CT November 15, 2017; chest radiograph April 25, 2018 FINDINGS: Cardiovascular: There is no demonstrable pulmonary embolus. There is no thoracic aortic aneurysm or dissection. Visualized great vessels appear unremarkable. There is no pericardial effusion or pericardial thickening. There is left ventricular hypertrophy. Mediastinum/Nodes: Thyroid appears unremarkable. There are scattered subcentimeter mediastinal lymph nodes. There is a subcarinal lymph node measuring 1.7 x 1.4 cm. No other lymph node enlargement is demonstrable. Nasogastric tube passes through the esophagus into the stomach. There is a small hiatal hernia. Lungs/Pleura: Endotracheal tube tip is just above the carina. No pneumothorax. There is atelectatic change in the lower lung regions. There is no edema or consolidation. No appreciable pleural effusion. Upper Abdomen: There is a biliary stent. Air within the biliary ductal system is due to previous sphincterotomy. There is a  mass arising in the pancreatic head region measuring 4.4 x 4.7 cm. A drainage catheter is noted in the upper abdomen. There are foci of pneumoperitoneum. There is mild upper abdominal ascites. Musculoskeletal: No blastic or lytic bone lesions are evident. There are no chest wall lesions. Review of the MIP images confirms the above findings. IMPRESSION: 1. No demonstrable pulmonary embolus. No thoracic aortic aneurysm or dissection. 2. Areas of atelectatic change bilaterally. No consolidation. No pneumothorax. Endotracheal tube tip is slightly superior to the carina. 3. Enlarged subcarinal lymph node, concerning for neoplastic etiology. 4. 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 Biliary stent present. Biliary duct air is felt to be secondary to the stent placement. 5. Mild upper abdominal ascites. Pneumoperitoneum with apparent drainage catheter present. The pneumoperitoneum is likely of post procedural/postoperative etiology. Bowel perforation in this circumstance cannot be excluded entirely, however. Close clinical assessment in this regard advised. 6.  Left ventricular hypertrophy. Electronically Signed: By: WLowella GripIII M.D. On: 04/25/2018 19:04    Anti-infectives: Anti-infectives (  From admission, onward)   Start     Dose/Rate Route Frequency Ordered Stop   04/25/18 1530  ceFAZolin (ANCEF) IVPB 2g/100 mL premix     2 g 200 mL/hr over 30 Minutes Intravenous Every 8 hours 04/25/18 1520 04/25/18 1739   04/25/18 0600  ceFAZolin (ANCEF) IVPB 2g/100 mL premix     2 g 200 mL/hr over 30 Minutes Intravenous On call to O.R. 04/25/18 0545 04/25/18 1149      Assessment/Plan: s/p Procedure(s) with comments: LAPAROSCOPY DIAGNOSTIC (N/A) - GENERAL AND EPIDURAL ANESTHESIA WHIPPLE PROCEDURE, RECONSTRUCTION OF PORTAL VEIN (N/A) Tachycardia  Patient had SMV/portal vein clamped for 30 min during reconstruction.   LFTs bumped post op as expected.  Will recheck in AM.   Bowel was  edematous, but well perfused at end of case.   Anticipate blood tinged ascites  Will definitely hold on tube feeds for several days.  Flush NG and J tubes J tube to gravity to see if some of the bowel transudate will come out via tube. Fibrinogen OK and INR not out too far.  If needs more volume, could give additional 1-2 units of FFP.   UOP is picking up and pressors are coming down a bit.  Giving 25% albumin boluses for 48 hours for hypovolemia and significant third spacing of fluid.    RISS for hyperglycemia.  Combination stress induced and due to resection of head of pancreas.   ABL anemia - stable.   Tachycardia likely stress mediated.  Add propofol and narcotic boluses. RN tried versed but it was not successful and anxiolysis.     LOS: 0 days    Stark Klein 04/25/2018

## 2018-04-25 NOTE — Progress Notes (Signed)
eLink Physician-Brief Progress Note Patient Name: Jane Brooks DOB: 1970-09-04 MRN: [id number]   Date of Service  04/25/2018  HPI/Events of Note  Notified of tachycardia 160s, BP 88/76. Significant JP output, serosanguinous. Also has episodes of bradycardia  eICU Interventions  Ongoing albumin transfusion. Will monitor H/H and assess need for transfusion     Intervention Category Major Interventions: Arrhythmia - evaluation and management  Shona Needles Fuller Makin 04/25/2018, 8:14 PM

## 2018-04-25 NOTE — Interval H&P Note (Signed)
History and Physical Interval Note:  04/25/2018 7:33 AM  Jane Brooks  has presented today for surgery, with the diagnosis of Pancreatic cancer.  The various methods of treatment have been discussed with the patient and family. After consideration of risks, benefits and other options for treatment, the patient has consented to  Procedure(s) with comments: LAPAROSCOPY DIAGNOSTIC (N/A) - GENERAL AND EPIDURAL ANESTHESIA POSSIBLE WHIPPLE PROCEDURE, FEEDING TUBE PLACEMENT (N/A) as a surgical intervention.  The patient's history has been reviewed, patient examined, no change in status, stable for surgery.  I have reviewed the patient's chart and labs.  Questions were answered to the patient's satisfaction.     Stark Klein

## 2018-04-25 NOTE — Consult Note (Signed)
NAME:  Jane Brooks, MRN:  [id number], DOB:  1970-07-30, LOS: 0 ADMISSION DATE:  04/25/2018, CONSULTATION DATE:  04/25/2018 REFERRING MD:  Barry Dienes , CHIEF COMPLAINT:  Pancreatic cancer   Brief History   48 year old lady with pancreatic cancer Presented with a borderline resectable pancreatic head mass with significant invasion Has had chemotherapy Status post laparotomy  History of present illness   Postoperatively Hypotensive-on pressors Tachycardic Hemostasis was achieved during surgery  Past Medical History   Past Medical History:  Diagnosis Date  . Family history of lung cancer   . Family history of prostate cancer   . Family history of uterine cancer   . Gallstones 10/2017  . Pancreatic cancer ([place])   . Pneumothorax, closed, traumatic    years ago  . PONV (postoperative nausea and vomiting)      Significant Hospital Events   Post laparotomy for 10/06/2018  Consults:  PCCM 04/25/2018  Procedures:  Laparotomy-refer to surgical op note  Significant Diagnostic Tests:  CT abdomen and pelvis IMPRESSION: 1. Essentially stable exam compared to most recent CT scans of 02/04/2018 and 12/12/2017. 2. Stable mass in the head of the pancreas with duct dilatation and atrophy upstream. Mass continues to surround the GDA and contacts the portal vein but is free of the celiac trunk and SMA. Mass probably contacts the RIGHT hepatic artery at the takeoff of the GDA (image 56/5). Dominant LEFT hepatic artery originates from the LEFT gastric. 3. Mild intrahepatic and extrahepatic duct dilatation similar to prior with flexible stent in the common bile duct. 4. No new or progressive disease. 5. No peritoneal metastasis.  Micro Data:  none Antimicrobials:   Cefazolin 04/25/2018 Interim history/subjective:  On vent, sedated Tachycardic, hypotensive  Objective   Blood pressure 108/89, pulse (!) 127, temperature 98.5 F (36.9 C), temperature source Oral, resp. rate (!) 24,  height 4' 11"  (1.499 m), weight 56.7 kg, SpO2 99 %.    Vent Mode: PRVC FiO2 (%):  [40 %] 40 % Set Rate:  [16 bmp] 16 bmp Vt Set:  [450 mL] 450 mL PEEP:  [5 cmH20] 5 cmH20 Plateau Pressure:  [21 cmH20] 21 cmH20   Intake/Output Summary (Last 24 hours) at 04/25/2018 1622 Last data filed at 04/25/2018 1600 Gross per 24 hour  Intake 4680.16 ml  Output 1115 ml  Net 3565.16 ml   Filed Weights   04/25/18 0615 04/25/18 0646  Weight: 56.7 kg 56.7 kg    Examination: General: Sedated HENT: Moist oral mucosa Lungs: Clear breath sounds Cardiovascular: S1-S2 appreciated Abdomen: Bowel sounds hypoactive Extremities: No edema, no clubbing Neuro: Sedated, pupils about 4 to 5 mm sluggish, unresponsive GU: [facility] Problem list     Assessment & Plan:  Status post laparotomy, Whipple procedure  Pancreatic head cancer  Metabolic acidosis -Did receive bicarb during surgery -We will start on a bicarb drip -Repeat arterial blood gases in 2 hours  Respiratory acidosis -Increase respiratory rate-currently at 24  Hypoxemic respiratory failure -Concern for pulmonary embolism especially with her tachycardia and acute decompensation during surgery -She is currently hemodynamically unstable we will give her a few more minutes and if blood pressure remains stable will send her for CT scan of the chest with contrast to assess for PE  Hemodynamically unstable on the floor received 1 L of saline Received 25% albumin-100 cc Pain management  Started on a bicarb drip Follow electrolytes, repeat ABG in 1 hour  Obtain a head CT  Guarded prognosis  Best practice:  Diet: N.p.o. Pain/Anxiety/Delirium protocol (if indicated): Fentanyl VAP protocol (if indicated): In place DVT prophylaxis: scd GI prophylaxis: Protonix Glucose control:  Mobility: Bedrest Code Status: Full code Family Communication: No family at bedside Disposition: icu  Labs   CBC: Recent Labs  Lab  04/25/18 1539 04/25/18 1554  WBC 17.6*  --   HGB 12.4 11.6*  HCT 39.0 34.0*  MCV 70.7*  --   PLT 190  --     Basic Metabolic Panel: Recent Labs  Lab 04/25/18 1554  NA 142  K 4.5   GFR: Estimated Creatinine Clearance: 65.3 mL/min (by C-G formula based on SCr of 0.61 mg/dL). Recent Labs  Lab 04/25/18 1539  WBC 17.6*    Liver Function Tests: No results for input(s): AST, ALT, ALKPHOS, BILITOT, PROT, ALBUMIN in the last 168 hours. No results for input(s): LIPASE, AMYLASE in the last 168 hours. No results for input(s): AMMONIA in the last 168 hours.  ABG    Component Value Date/Time   PHART 7.137 (LL) 04/25/2018 1554   PCO2ART 48.3 (H) 04/25/2018 1554   PO2ART 132.0 (H) 04/25/2018 1554   HCO3 16.5 (L) 04/25/2018 1554   TCO2 18 (L) 04/25/2018 1554   ACIDBASEDEF 13.0 (H) 04/25/2018 1554   O2SAT 98.0 04/25/2018 1554     Coagulation Profile: Recent Labs  Lab 04/25/18 1354  INR 1.3*    Cardiac Enzymes: No results for input(s): CKTOTAL, CKMB, CKMBINDEX, TROPONINI in the last 168 hours.  HbA1C: No results found for: HGBA1C  CBG: Recent Labs  Lab 04/25/18 1536  GLUCAP 219*    Review of Systems:   Unobtainable  Past Medical History  She,  has a past medical history of Family history of lung cancer, Family history of prostate cancer, Family history of uterine cancer, Gallstones (10/2017), Pancreatic cancer ([place]), Pneumothorax, closed, traumatic, and PONV (postoperative nausea and vomiting).   Surgical History    Past Surgical History:  Procedure Laterality Date  . BILIARY STENT PLACEMENT  10/31/2017   Procedure: BILIARY STENT PLACEMENT;  Surgeon: [name], MD;  Location: [facility] ENDOSCOPY;  Service: Gastroenterology;;  . ERCP N/A 10/31/2017   Procedure: ENDOSCOPIC RETROGRADE CHOLANGIOPANCREATOGRAPHY (ERCP);  Surgeon: [name], MD;  Location[facility] ENDOSCOPY;  Service: Gastroenterology;  Laterality: N/A;  . ESOPHAGOGASTRODUODENOSCOPY N/A 11/08/2017    Procedure: ESOPHAGOGASTRODUODENOSCOPY (EGD);  Surgeon: Milus Banister, MD;  Location: [facility] ENDOSCOPY;  Service: Endoscopy;  Laterality: N/A;  . EUS N/A 11/08/2017   Procedure: UPPER ENDOSCOPIC ULTRASOUND (EUS) RADIAL;  Surgeon: Milus Banister, MD;  Location: [facility] ENDOSCOPY;  Service: Endoscopy;  Laterality: N/A;  . FINE NEEDLE ASPIRATION  11/08/2017   Procedure: FINE NEEDLE ASPIRATION;  Surgeon: Milus Banister, MD;  Location: [facility] ENDOSCOPY;  Service: Endoscopy;;  . IR IMAGING GUIDED PORT INSERTION  11/15/2017  . SPHINCTEROTOMY  10/31/2017   Procedure: SPHINCTEROTOMY;  Surgeon: [name], MD;  Location: [facility] ENDOSCOPY;  Service: Gastroenterology;;  . TONSILLECTOMY       Social History   reports that she has been smoking cigarettes. She has a 37.00 pack-year smoking history. She has never used smokeless tobacco. She reports previous alcohol use. She reports that she does not use drugs.   Family History   Her family history includes COPD in her mother; Diabetes in her mother and sister; Hypertension in her mother, sister, and sister; Lung cancer in her maternal grandmother; Uterine cancer (age of onset: 68) in her mother.   Allergies Allergies  Allergen Reactions  . Latex Hives  Critical care time: 44mnutes of critical care time spent evaluating patient, reviewing plans of care

## 2018-04-25 NOTE — Anesthesia Procedure Notes (Deleted)
Epidural

## 2018-04-25 NOTE — Transfer of Care (Signed)
Immediate Anesthesia Transfer of Care Note  Patient: Jane Brooks  Procedure(s) Performed: LAPAROSCOPY DIAGNOSTIC (N/A Abdomen) WHIPPLE PROCEDURE, RECONSTRUCTION OF PORTAL VEIN (N/A Abdomen)  Patient Location: ICU  Anesthesia Type:General  Level of Consciousness: Patient remains intubated per anesthesia plan  Airway & Oxygen Therapy: Patient remains intubated per anesthesia plan and Patient placed on Ventilator (see vital sign flow sheet for setting)  Post-op Assessment: Report given to RN and Post -op Vital signs reviewed and stable  Post vital signs: Reviewed and stable  Last Vitals:  Vitals Value Taken Time  BP 95/74 04/25/2018  3:15 PM  Temp    Pulse 172 04/25/2018  3:19 PM  Resp 16 04/25/2018  3:22 PM  SpO2 71 % 04/25/2018  3:19 PM  Vitals shown include unvalidated device data.  Last Pain:  Vitals:   04/25/18 0646  TempSrc: Oral  PainSc:       Patients Stated Pain Goal: 2 (95/07/22 5750)  Complications: No apparent anesthesia complications Chest Xray performed at end of case to check OETT depth. Bilateral breath sounds at 21 cm.

## 2018-04-25 NOTE — Anesthesia Postprocedure Evaluation (Signed)
Anesthesia Post Note  Patient: Jane Brooks  Procedure(s) Performed: LAPAROSCOPY DIAGNOSTIC (N/A Abdomen) WHIPPLE PROCEDURE, RECONSTRUCTION OF PORTAL VEIN (N/A Abdomen)     Patient location during evaluation: SICU Anesthesia Type: General Level of consciousness: sedated Pain management: pain level controlled Vital Signs Assessment: vitals unstable Respiratory status: respiratory function unstable Cardiovascular status: unstable Postop Assessment: no apparent nausea or vomiting Anesthetic complications: no Comments: Pt was doing well in surgery until an acute event at around noon. Difficult to say what the event was because several things happened at that time. The epidural was dosed with 7cc of 2% lidocaine. The surgeon had some acute blood loss requiring transfusion. This was also around the time the surgeon was "pringling" the liver. The patient had been mildly tachycardic but then became acutely hypotensive. Two units of blood were given and a blood gas was drawn. Phenylephrine and epinephrine were also administered secondary to hypotension. ABG revealed respiratory acidosis. We made some ventilatory changes and continued to give volume. Chest auscultation sounded bilateral. Upon recheck of the ABG, the acidosis was worse. Two amps of Bicarb were given. Only slight improvement with the bicarb. We gave another amp of bicarb and continued to increase the phenylephrine gtt. As the case was coming to a close I wanted to get a chest xray to evalutae ETT depth in light of poor ventilation. The ett may have been deep and was pulled back 2cm until I could palpate the cuff in the sternal notch. I spoke to Dr. Barry Dienes about my concern for a possible pulmonary embolus. She said she would order a scan to rule that out as a cause for the problems in the OR. We left the patient intubated and took her to the ICU to further evaluate her hypotension and poor ventilation.    Last Vitals:  Vitals:   04/25/18 1610 04/25/18 1615  BP: (!) 116/95 108/89  Pulse:    Resp: (!) 24 (!) 24  Temp:    SpO2:      Last Pain:  Vitals:   04/25/18 0646  TempSrc: Oral  PainSc:                  Robie Mcniel,W. EDMOND

## 2018-04-25 NOTE — Progress Notes (Signed)
Emergency Contact:  Norva Karvonen [phone]  Family: (per patient, ok to give updates if they call)  Verne Grain (sister) [phone] Fredderick Phenix (nephew) [phone]

## 2018-04-25 NOTE — Anesthesia Procedure Notes (Signed)
Epidural Patient location during procedure: pre-op Start time: 04/25/2018 7:00 AM End time: 04/25/2018 7:05 AM  Staffing Anesthesiologist: Janeece Riggers, MD  Preanesthetic Checklist Completed: patient identified, site marked, surgical consent, pre-op evaluation, timeout performed, IV checked, risks and benefits discussed and monitors and equipment checked  Epidural Patient position: sitting Prep: site prepped and draped and DuraPrep Patient monitoring: continuous pulse ox and blood pressure Approach: midline Location: thoracic (1-12) (T10-11) Injection technique: LOR air  Needle:  Needle type: Tuohy  Needle gauge: 17 G Needle length: 9 cm and 9 Needle insertion depth: 5 cm cm Catheter type: closed end flexible Catheter size: 19 Gauge Catheter at skin depth: 12 cm Test dose: negative  Assessment Events: blood not aspirated, injection not painful, no injection resistance, negative IV test and no paresthesia

## 2018-04-25 NOTE — Anesthesia Procedure Notes (Signed)
Procedure Name: Intubation Date/Time: 04/25/2018 7:52 AM Performed by: Leonor Liv, CRNA Pre-anesthesia Checklist: Patient identified, Emergency Drugs available, Suction available and Patient being monitored Patient Re-evaluated:Patient Re-evaluated prior to induction Oxygen Delivery Method: Circle System Utilized Preoxygenation: Pre-oxygenation with 100% oxygen Induction Type: IV induction and Rapid sequence Laryngoscope Size: Mac and 3 Grade View: Grade II Tube type: Oral Tube size: 7.0 mm Number of attempts: 1 Airway Equipment and Method: Stylet and Oral airway Placement Confirmation: ETT inserted through vocal cords under direct vision,  positive ETCO2 and breath sounds checked- equal and bilateral Secured at: 21 cm Tube secured with: Tape Dental Injury: Teeth and Oropharynx as per pre-operative assessment

## 2018-04-25 NOTE — Progress Notes (Signed)
Pt emptied out over 1 liter from her abdominal drain. Pt also only put out 75cc of urine since 1500. HR sustaining in 150's. Dr. Barry Dienes paged. Orders for DIC panel given. Will continue to monitor

## 2018-04-25 NOTE — Op Note (Signed)
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   PROCEDURES PERFORMED:  Diagnostic laparoscopy  Classic pancreaticoduodenectomy  Portal Vein reconstruction   Placement of pancreatic stent  Jejunostomy Feeding tube 16 Fr.    SURGEON: Stark Klein, MD   ASSISTANT: Leighton Ruff, MD   ANESTHESIA: General and epidural   FINDINGS: 3 cm pancreatic head mass. Firm pancreatic tissue. 8 mm common bile duct. 3 mm pancreatic duct  SPECIMENS:  1. Pancreaticoduodenectomy with gallbladder, segment of portal vein attached.   2. Portal nodes   ESTIMATED BLOOD LOSS: 1500 mL.   COMPLICATIONS: None known.   PROCEDURE:   Pt was identified in the holding area and taken to  the operating room, and placed supine on the operating room  table. General anesthesia was induced. The patient's abdomen was  prepped and draped in a sterile fashion, after a Foley catheter was  placed. A time-out was performed according to the surgical safety check  list. When all was correct we continued.   The patient was placed in reverse trendelenburg position and rotated to the right.  The left subcostal margin was anesthetized with local anesthesia.  A 5 mm optiview trocar was placed under direct visualization.  The abdomen was insufflated with carbon dioxide.  The abdomen was examined.  A second port was placed in the upper midline to be able to better visualize the right liver.  No evidence of carcinomatosis was seen.    A midline incision was made from the xiphoid to just above the umbilicus.  The subcutaneous tissues were divided with the Bovie cautery. The peritoneum was entered in the center of the abdomen. Digital retraction was then used to elevate the preperitoneal fat, and this was taken with the cautery as well. The subcutaneous tissues and  fascia of the muscular layers were taken laterally with the cautery.  Care was taken to protect the underlying viscera.    The Bookwalter self-retaining retractor was placed  for visualization.  The gallbladder was taken off the liver with a combination of blunt dissection and cautery. The cystic duct was clipped with the Hemalock clips. The cystic duct was divided and the gallbladder was passed off. The porta was identified. The  duodenum was kocherized extensively with blunt dissection and with cautery.  The common bile duct was skeletonized near the duodenum. A vessel loop was passed around it.   The gastroduodenal artery, as well as the common hepatic artery were skeletonized. There was concern from her pre op imaging that her proper hepatic artery may be involved in the tumor, but she had a good dominant accessory left hepatic artery.  The artery was able to be dissected free, but it was difficult.  The proper hepatic artery was traced out to make sure that flow was going to both sides of the liver when the GDA was clamped. The GDA was test clamped with the bulldog, with good flow to the liver and  no signs of ischemia. This was divided with 2-0 silk ties and then clipped. The proper hepatic artery was reflected upward, and the anterior portal vein was exposed.   At this point, there was suspicion of the portal vein being involved with the tumor. The Analucia was placed under the pancreas, but a bit medial to the portal vein.   Attention was then directed to the stomach, and the omentum was taken  off of the stomach at the border of the antrum and the body. The stomach was divided  with the GIA-75 stapler. The border  of the stomach was oversewn with a 3-0 running PDS suture.   Attention was then directed to the small bowel. Around 10 cm past the  ligament of Treitz the bowel was divided with the 75-GIA.  The distal portion of the jejunum was also oversewn with a 3-0 PDS  suture. The fourth portion of the duodenum was skeletonized with the  harmonic scalpel, taking down all of the mesenteric vessels. The  ligament  of Treitz was taken down. The IMV was preserved.  The duodenum was then passed underneath the portal vein.   At this point the Dajana was replaced and the pancreas was divided with the cautery. 2-0 silk sutures were tied down and the inferior and superior border of the pancreas. The Bovie was used to coagulate the small bleeders at the border of the pancreas.  The Overholt in combination with the harmonic and locking Weck clips  were then used to take the uncinate process off of the portal vein and  the superior mesenteric artery. Care was taken not to incorporate the  superior mesenteric artery in the dissection. A portion of superior mesenteric vein was still densely adherent to the tumor.  The SMV and portal vein were dissected out a bit and clamped with vascular clamps.  A subcm portion of portal vein was removed with the tumor.    The SMV was anastamosed with running 4-0 prolene suture.  When the clamps were let off, there was significant bleeding.  The clamps were replaced, but there was still some additional bleeding.  Pressure was held while anesthesia caught up.  The clamps were reapplied and the bleeding was controlled.  There was a hole in the side of the SMV. This was repaired with running 4-0 Prolene suture.  The clamps were removed this time and no additional bleeding was seen.  The patient was placed into trendelenburg position.  The specimen was then marked with the margin marker paint kit and sent for frozen section.  The SMV and portal vein were checked with the doppler and good venous signal was heard.     The frozen sections returned back negative for cancer, but the pancreatic duct had some dysplasia.  An additional margin was sent from there with the cautery.    The bowel was a bit distended.  The jejunum was then passed underneath  the SMV in order to get appropriate lie for the pancreatic and biliary  anastomoses. The more distal portion of the jejunum was pulled up over  the  colon, and two 3-0 silks were placed through the posterior border  of the stomach for the gastrojejunostomy. The stomach and the small  bowel were opened, and a GIA-75 was used to create an end-to-end  anastomosis. The open areas of the staple line were examined to ensure  that there was hemostasis. The defect was then closed with a single  layer of running Connell suture of 3-0 PDS. Prior to a complete  closure, the NG tube was passed toward the afferent limb.   A J tube was placed around 30-40 cm beyond the gastrojejunostomy.  A pursestring 3-0 silk was placed.  The tube was advanced through the mid left abdominal wall.  The tube was advanced through the pursestring suture and advanced into the small bowel.  The tube was Witzeled with additional 3-0 silks.  1.5 ml saline was placed into the j tube balloon.  This was pexed to the abdominal wall  with interrupted 3-0 silks.    The appropriate location for the choledochojejunostomy was identified, and  the small bowel was opened approximately 10 mm. The anastamosis was created with approximately eleven 4-0 interrupted PDS sutures.   The 2 corner sutures were placed first and then the posterior layer was done in an interrupted fashion tying on  the inside. The superior layer was then closed with interrupted sutures as  well.   The pancreatic anastomosis was then created by opening the jejunum the length  of the pancreatic parenchyma. The pancreas was firm, and the duct was 2-3 mm. A pediatric feeding tube was used as a pancreatic stent. The posterior layer was formed first with 2-0 silk sutures in interrupted fashion. Five 5-0 PDS sutures were used to create a duct to mucosa anastamosis.   The anterior layer was then oversewn with 2-0 silks to dunk the pancreatic parenchyma.   The areas were then irrigated and then those anastomoses were covered  with Vistaseal. This was allowed to dry.  The abdomen was then irrigated again and all the laparotomy  sponges were removed. A lap count was  performed, which was correct. Two 19-Blake drains were placed, with the  lateral-most drain placed behind the choledochojejunostomy. The medial  Blake drain was placed just anterior and slightly superior to the  pancreaticojejunostomy. The fascia was then closed with #1 looped running PDS sutures. The skin was irrigated and then closed with  staples. The wounds were cleaned, dried and dressed with a sterile  dressing.   The patient tolerated the procedure well and was extubated and taken to  PACU in stable condition. Needle and sponge counts were correct x2.

## 2018-04-26 ENCOUNTER — Inpatient Hospital Stay (HOSPITAL_COMMUNITY): Payer: Medicaid Other

## 2018-04-26 ENCOUNTER — Encounter (HOSPITAL_COMMUNITY): Payer: Self-pay | Admitting: Anesthesiology

## 2018-04-26 ENCOUNTER — Encounter (HOSPITAL_COMMUNITY): Payer: Self-pay | Admitting: General Surgery

## 2018-04-26 LAB — PREPARE FRESH FROZEN PLASMA
Unit division: 0
Unit division: 0

## 2018-04-26 LAB — CBC
HCT: 27 % — ABNORMAL LOW (ref 36.0–46.0)
Hemoglobin: 8.9 g/dL — ABNORMAL LOW (ref 12.0–15.0)
MCH: 22 pg — ABNORMAL LOW (ref 26.0–34.0)
MCHC: 33 g/dL (ref 30.0–36.0)
MCV: 66.8 fL — ABNORMAL LOW (ref 80.0–100.0)
Platelets: 134 10*3/uL — ABNORMAL LOW (ref 150–400)
RBC: 4.04 MIL/uL (ref 3.87–5.11)
RDW: 16.3 % — ABNORMAL HIGH (ref 11.5–15.5)
WBC: 14.8 10*3/uL — ABNORMAL HIGH (ref 4.0–10.5)
nRBC: 0.3 % — ABNORMAL HIGH (ref 0.0–0.2)

## 2018-04-26 LAB — PHOSPHORUS: Phosphorus: 7.5 mg/dL — ABNORMAL HIGH (ref 2.5–4.6)

## 2018-04-26 LAB — POCT I-STAT 7, (LYTES, BLD GAS, ICA,H+H)
Acid-Base Excess: 1 mmol/L (ref 0.0–2.0)
Acid-Base Excess: 7 mmol/L — ABNORMAL HIGH (ref 0.0–2.0)
Bicarbonate: 25.2 mmol/L (ref 20.0–28.0)
Bicarbonate: 30.4 mmol/L — ABNORMAL HIGH (ref 20.0–28.0)
Calcium, Ion: 0.92 mmol/L — ABNORMAL LOW (ref 1.15–1.40)
Calcium, Ion: 0.92 mmol/L — ABNORMAL LOW (ref 1.15–1.40)
HCT: 27 % — ABNORMAL LOW (ref 36.0–46.0)
HCT: 29 % — ABNORMAL LOW (ref 36.0–46.0)
Hemoglobin: 9.2 g/dL — ABNORMAL LOW (ref 12.0–15.0)
Hemoglobin: 9.9 g/dL — ABNORMAL LOW (ref 12.0–15.0)
O2 Saturation: 99 %
O2 Saturation: 97 %
Patient temperature: 98.2
Patient temperature: 97.6
Potassium: 4.8 mmol/L (ref 3.5–5.1)
Potassium: 4.6 mmol/L (ref 3.5–5.1)
Sodium: 140 mmol/L (ref 135–145)
Sodium: 140 mmol/L (ref 135–145)
TCO2: 26 mmol/L (ref 22–32)
TCO2: 32 mmol/L (ref 22–32)
pCO2 arterial: 34.9 mmHg (ref 32.0–48.0)
pCO2 arterial: 36.1 mmHg (ref 32.0–48.0)
pH, Arterial: 7.466 — ABNORMAL HIGH (ref 7.350–7.450)
pH, Arterial: 7.533 — ABNORMAL HIGH (ref 7.350–7.450)
pO2, Arterial: 108 mmHg (ref 83.0–108.0)
pO2, Arterial: 78 mmHg — ABNORMAL LOW (ref 83.0–108.0)

## 2018-04-26 LAB — BPAM FFP
Blood Product Expiration Date: 202004122359
Blood Product Expiration Date: 202004122359
ISSUE DATE / TIME: 202004091616
ISSUE DATE / TIME: 202004091728
Unit Type and Rh: 6200
Unit Type and Rh: 6200

## 2018-04-26 LAB — COMPREHENSIVE METABOLIC PANEL
ALT: 1318 U/L — ABNORMAL HIGH (ref 0–44)
AST: 1717 U/L — ABNORMAL HIGH (ref 15–41)
Albumin: 4.4 g/dL (ref 3.5–5.0)
Alkaline Phosphatase: 203 U/L — ABNORMAL HIGH (ref 38–126)
Anion gap: 22 — ABNORMAL HIGH (ref 5–15)
BUN: 19 mg/dL (ref 6–20)
CO2: 19 mmol/L — ABNORMAL LOW (ref 22–32)
Calcium: 8.1 mg/dL — ABNORMAL LOW (ref 8.9–10.3)
Chloride: 100 mmol/L (ref 98–111)
Creatinine, Ser: 1.93 mg/dL — ABNORMAL HIGH (ref 0.44–1.00)
GFR calc Af Amer: 35 mL/min — ABNORMAL LOW (ref >60)
GFR calc non Af Amer: 30 mL/min — ABNORMAL LOW (ref >60)
Glucose, Bld: 127 mg/dL — ABNORMAL HIGH (ref 70–99)
Potassium: 4.9 mmol/L (ref 3.5–5.1)
Sodium: 141 mmol/L (ref 135–145)
Total Bilirubin: 0.9 mg/dL (ref 0.3–1.2)
Total Protein: 5.8 g/dL — ABNORMAL LOW (ref 6.5–8.1)

## 2018-04-26 LAB — GLUCOSE, CAPILLARY
Glucose-Capillary: 136 mg/dL — ABNORMAL HIGH (ref 70–99)
Glucose-Capillary: 118 mg/dL — ABNORMAL HIGH (ref 70–99)
Glucose-Capillary: 100 mg/dL — ABNORMAL HIGH (ref 70–99)
Glucose-Capillary: 111 mg/dL — ABNORMAL HIGH (ref 70–99)
Glucose-Capillary: 98 mg/dL (ref 70–99)
Glucose-Capillary: 113 mg/dL — ABNORMAL HIGH (ref 70–99)

## 2018-04-26 LAB — MAGNESIUM: Magnesium: 1.5 mg/dL — ABNORMAL LOW (ref 1.7–2.4)

## 2018-04-26 LAB — HEMOGLOBIN AND HEMATOCRIT, BLOOD
HCT: 21.6 % — ABNORMAL LOW (ref 36.0–46.0)
HCT: 31.4 % — ABNORMAL LOW (ref 36.0–46.0)
Hemoglobin: 7.2 g/dL — ABNORMAL LOW (ref 12.0–15.0)
Hemoglobin: 11 g/dL — ABNORMAL LOW (ref 12.0–15.0)

## 2018-04-26 LAB — PROTIME-INR
INR: 1.8 — ABNORMAL HIGH (ref 0.8–1.2)
Prothrombin Time: 20.4 seconds — ABNORMAL HIGH (ref 11.4–15.2)

## 2018-04-26 LAB — PREPARE RBC (CROSSMATCH)

## 2018-04-26 MED ORDER — SODIUM CHLORIDE 0.9% IV SOLUTION
Freq: Once | INTRAVENOUS | Status: AC
  Administered 2018-04-26: 15:00:00 via INTRAVENOUS

## 2018-04-26 MED ORDER — ORAL CARE MOUTH RINSE
15.0000 mL | OROMUCOSAL | Status: DC
  Administered 2018-04-26: 15 mL via OROMUCOSAL

## 2018-04-26 MED ORDER — CHLORHEXIDINE GLUCONATE 0.12% ORAL RINSE (MEDLINE KIT)
15.0000 mL | Freq: Two times a day (BID) | OROMUCOSAL | Status: DC
  Administered 2018-04-26: 15 mL via OROMUCOSAL

## 2018-04-26 MED ORDER — MAGNESIUM SULFATE 2 GM/50ML IV SOLN
2.0000 g | Freq: Once | INTRAVENOUS | Status: AC
  Administered 2018-04-26: 2 g via INTRAVENOUS
  Filled 2018-04-26: qty 50

## 2018-04-26 MED ORDER — LACTATED RINGERS IV BOLUS
500.0000 mL | Freq: Once | INTRAVENOUS | Status: AC
  Administered 2018-04-26: 500 mL via INTRAVENOUS

## 2018-04-26 MED ORDER — DEXTROSE-NACL 5-0.9 % IV SOLN
INTRAVENOUS | Status: DC
  Administered 2018-04-26 – 2018-04-29 (×5): via INTRAVENOUS

## 2018-04-26 MED ORDER — SODIUM CHLORIDE 0.9 % IV BOLUS
1000.0000 mL | Freq: Once | INTRAVENOUS | Status: AC
  Administered 2018-04-26: 1000 mL via INTRAVENOUS

## 2018-04-26 MED ORDER — SODIUM CHLORIDE 0.9 % IV BOLUS
500.0000 mL | Freq: Once | INTRAVENOUS | Status: AC
  Administered 2018-04-26: 500 mL via INTRAVENOUS

## 2018-04-26 MED ORDER — LIDOCAINE HCL (PF) 1 % IJ SOLN: 5.0000 mL | Freq: Once | INTRAMUSCULAR | Status: DC

## 2018-04-26 MED ORDER — SODIUM CHLORIDE 0.9% IV SOLUTION: Freq: Once | INTRAVENOUS | Status: DC

## 2018-04-26 MED ORDER — SODIUM CHLORIDE 0.9 % IV SOLN
0.5000 mg/h | INTRAVENOUS | Status: DC
  Administered 2018-04-26: 1 mg/h via INTRAVENOUS
  Filled 2018-04-26: qty 5

## 2018-04-26 MED ORDER — MENTHOL 3 MG MT LOZG
1.0000 | LOZENGE | OROMUCOSAL | Status: DC | PRN
  Filled 2018-04-26: qty 9

## 2018-04-26 MED ORDER — ROPIVACAINE HCL 2 MG/ML IJ SOLN
5.0000 mL/h | INTRAMUSCULAR | Status: DC
  Administered 2018-04-26: 17:00:00 7 mL/h via EPIDURAL
  Administered 2018-04-27 – 2018-04-29 (×3): 8 mL/h via EPIDURAL
  Filled 2018-04-26 (×6): qty 200

## 2018-04-26 NOTE — Progress Notes (Signed)
PCCM Interval Note  Called to bedside, patient self-extubated, pulled her GT  She remains tachycardic, 140's. Her propofol and dilaudid started this am are now off. Slightly tachypneic but overall comfortable resp pattern.   No indication to re-intubate at this time although at some risk. As long as we can manage her abd pain, correct her metabolic status adequately while insuring good resp status then I believe she will stay extubated.   Baltazar Apo, MD, PhD 04/26/2018, 11:08 AM [facility] [phone] or if no answer [phone]

## 2018-04-26 NOTE — Progress Notes (Signed)
1 Day Post-Op   Subjective/Chief Complaint: Was on and off a little levo overnight.  Currently off.  Drain output remained high.    Objective: Vital signs in last 24 hours: Temp:  [96 F (35.6 C)-99.3 F (37.4 C)] 99.3 F (37.4 C) (04/10 0400) Pulse Rate:  [127-172] 156 (04/10 0600) Resp:  [16-33] 28 (04/10 0600) BP: (65-149)/(27-120) 100/87 (04/10 0600) SpO2:  [71 %-100 %] 100 % (04/10 0600) Arterial Line BP: (80-175)/(51-109) 113/70 (04/10 0600) FiO2 (%):  [40 %] 40 % (04/10 0340) Last BM Date: 04/25/18  Intake/Output from previous day: 04/09 0701 - 04/10 0700 In: 6086.1 [I.V.:4567.3; Blood:630; IV Piggyback:888.7] Out: 4215 [Urine:515; Drains:2600; Stool:300; Blood:800] Intake/Output this shift: No intake/output data recorded.  General appearance: moving a little more this AM.   Resp: synchronous on vent, regular Cardio: regular, tachycardic GI: soft, distended, dressing c/d/i.  drains wtih bloody ascites, but serosanguinous.  Not frankly bloody.   Extremities: extremities normal, atraumatic, no cyanosis or edema Pulses: palpable  Lab Results:  Recent Labs    04/25/18 1800  04/25/18 1911 04/25/18 2017 04/26/18 0513  WBC 23.6*  --   --   --  14.8*  HGB 10.9*   < >  --  10.1* 8.9*  HCT 33.9*   < >  --  32.4* 27.0*  PLT 170  --  173  --  134*   < > = values in this interval not displayed.   BMET Recent Labs    04/25/18 1800 04/25/18 1802 04/26/18 0513  NA 141 141 141  K 4.3 4.2 4.9  CL 105  --  100  CO2 16*  --  19*  GLUCOSE 239*  --  127*  BUN 11  --  19  CREATININE 1.11*  --  1.93*  CALCIUM 7.9*  --  8.1*   PT/INR Recent Labs    04/25/18 1911 04/26/18 0513  LABPROT 17.3* 20.4*  INR 1.4* 1.8*   ABG Recent Labs    04/25/18 1802 04/25/18 2119  PHART 7.222* 7.233*  HCO3 18.1* 15.3*    Studies/Results: Dg Chest 1 View  Result Date: 04/25/2018 CLINICAL DATA:  Status post repositioning of endotracheal tube. EXAM: CHEST  1 VIEW COMPARISON:   Single-view of the chest earlier today. FINDINGS: Endotracheal tube is been pulled back with the tip now 1.7 cm above the carina. No other change. IMPRESSION: As above. Electronically Signed   By: Inge Rise M.D.   On: 04/25/2018 14:57   Dg Chest 1 View  Result Date: 04/25/2018 CLINICAL DATA:  Status post ET tube placement. EXAM: CHEST  1 VIEW COMPARISON:  CT chest 11/15/2017. Single-view of the chest 11/08/2017. FINDINGS: Endotracheal tube is in place with the tip just within the right mainstem bronchus. NG tube tip is in good position in the stomach. Right Port-A-Cath noted. Lungs are clear. Heart size is normal. No pneumothorax or pleural effusion. Remote left rib fractures noted. IMPRESSION: ET tube is in the right mainstem bronchus. NG tube in good position. Lungs clear. Electronically Signed   By: Inge Rise M.D.   On: 04/25/2018 14:57   Ct Head Wo Contrast  Result Date: 04/25/2018 CLINICAL DATA:  Encephalopathy with sluggish pupils EXAM: CT HEAD WITHOUT CONTRAST TECHNIQUE: Contiguous axial images were obtained from the base of the skull through the vertex without intravenous contrast. COMPARISON:  None. FINDINGS: Brain: The ventricles are normal in size and configuration. There is symmetric frontal atrophy bilaterally. There is no intracranial mass, hemorrhage, extra-axial fluid  collection, or midline shift. Brain parenchyma appears unremarkable. No evident acute infarct. Vascular: There is no hyperdense vessel. There is no appreciable vascular calcification. Skull: Bony calvarium appears intact. Sinuses/Orbits: Visualized paranasal sinuses are clear. Visualized orbits appear symmetric bilaterally. Other: Mastoid air cells are clear. IMPRESSION: Frontal atrophy bilaterally. Ventricles normal in size and configuration. Brain parenchyma appears unremarkable. No acute infarct. No mass or hemorrhage. Electronically Signed   By: Lowella Grip III M.D.   On: 04/25/2018 19:06   Ct Angio Chest  Pe W Or Wo Contrast  Addendum Date: 04/25/2018   ADDENDUM REPORT: 04/25/2018 19:49 ADDENDUM: Comment: By report, the mass has been removed from the head of the pancreas. 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 The stent is actually a pancreatic stent, no longer extending into the biliary duct system. The air in the biliary ductal system is felt to be due to the Whipple procedure and stent placement. Electronically Signed   By: Lowella Grip III M.D.   On: 04/25/2018 19:49   Result Date: 04/25/2018 CLINICAL DATA:  Hypoxia EXAM: CT ANGIOGRAPHY CHEST WITH CONTRAST TECHNIQUE: Multidetector CT imaging of the chest was performed using the standard protocol during bolus administration of intravenous contrast. Multiplanar CT image reconstructions and MIPs were obtained to evaluate the vascular anatomy. CONTRAST:  41m OMNIPAQUE IOHEXOL 350 MG/ML SOLN COMPARISON:  Chest CT November 15, 2017; chest radiograph April 25, 2018 FINDINGS: Cardiovascular: There is no demonstrable pulmonary embolus. There is no thoracic aortic aneurysm or dissection. Visualized great vessels appear unremarkable. There is no pericardial effusion or pericardial thickening. There is left ventricular hypertrophy. Mediastinum/Nodes: Thyroid appears unremarkable. There are scattered subcentimeter mediastinal lymph nodes. There is a subcarinal lymph node measuring 1.7 x 1.4 cm. No other lymph node enlargement is demonstrable. Nasogastric tube passes through the esophagus into the stomach. There is a small hiatal hernia. Lungs/Pleura: Endotracheal tube tip is just above the carina. No pneumothorax. There is atelectatic change in the lower lung regions. There is no edema or consolidation. No appreciable pleural effusion. Upper Abdomen: There is a biliary stent. Air within the biliary ductal system is due to previous sphincterotomy. There is a mass arising in the pancreatic head region measuring 4.4 x 4.7  cm. A drainage catheter is noted in the upper abdomen. There are foci of pneumoperitoneum. There is mild upper abdominal ascites. Musculoskeletal: No blastic or lytic bone lesions are evident. There are no chest wall lesions. Review of the MIP images confirms the above findings. IMPRESSION: 1. No demonstrable pulmonary embolus. No thoracic aortic aneurysm or dissection. 2. Areas of atelectatic change bilaterally. No consolidation. No pneumothorax. Endotracheal tube tip is slightly superior to the carina. 3. Enlarged subcarinal lymph node, concerning for neoplastic etiology. 4. 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 Biliary stent present. Biliary duct air is felt to be secondary to the stent placement. 5. Mild upper abdominal ascites. Pneumoperitoneum with apparent drainage catheter present. The pneumoperitoneum is likely of post procedural/postoperative etiology. Bowel perforation in this circumstance cannot be excluded entirely, however. Close clinical assessment in this regard advised. 6.  Left ventricular hypertrophy. Electronically Signed: By: WLowella GripIII M.D. On: 04/25/2018 19:04    Anti-infectives: Anti-infectives (From admission, onward)   Start     Dose/Rate Route Frequency Ordered Stop   04/25/18 1530  ceFAZolin (ANCEF) IVPB 2g/100 mL premix     2 g 200 mL/hr over 30 Minutes Intravenous Every 8 hours 04/25/18 1520 04/25/18  1739   04/25/18 0600  ceFAZolin (ANCEF) IVPB 2g/100 mL premix     2 g 200 mL/hr over 30 Minutes Intravenous On call to O.R. 04/25/18 0545 04/25/18 1149      Assessment/Plan: s/p Procedure(s) with comments: LAPAROSCOPY DIAGNOSTIC (N/A) - GENERAL AND EPIDURAL ANESTHESIA WHIPPLE PROCEDURE, RECONSTRUCTION OF PORTAL VEIN (N/A) Tachycardia  Patient had SMV/portal vein clamped for 30 min during reconstruction.   LFTs bumped post op as expected, up more today.   Bowel was edematous, but well perfused at end of case.  Had good pulse and doppler  signal in portal vein and proper hepatic artery.   Anticipate more blood tinged ascites.  Replace with albumin.   Will definitely hold on tube feeds for several days.  Flush NG and J tubes INR up more today.  Give 2 u ffp.    Giving 25% albumin boluses for 48 hours for hypovolemia and significant third spacing of fluid.   p CXR and ABG pending ordered.   RISS for hyperglycemia.  Combination stress induced and due to resection of head of pancreas.   ABL anemia - stable.   Tachycardia likely stress mediated.  Propofol and narcotic boluses.   Restraints today.     LOS: 1 day    Stark Klein 04/26/2018

## 2018-04-26 NOTE — Anesthesia Post-op Follow-up Note (Signed)
  Anesthesia Pain Follow-up Note  Patient: Lexine Jaspers  Day #: 1   Date of Follow-up: 04/26/2018 Time: 4:22 PM  Last Vitals:  Vitals:   04/26/18 1430 04/26/18 1500  BP:  (!) 144/87  Pulse: (!) 142 (!) 142  Resp: (!) 55 (!) 31  Temp: 37.1 C 36.7 C  SpO2: 98% 100%    Level of Consciousness: alert  Pain: severe (will re-activate epidural now that patient stable, extubated)  Side Effects:None  Catheter Site Exam:clean, dry, no drainage     Plan: Modify therapy to improve pain control at surgeon's requestepidural re-activated with 5cc 1.5% lidocaine 1:200k epi, with remarkable pain relief. Epidural infusion re-started, pt very comfortable, VSS    Kenechukwu Eckstein,E. Honesty Menta

## 2018-04-26 NOTE — Progress Notes (Signed)
NAME:  Jane Brooks, MRN:  [id number], DOB:  11-16-1970, LOS: 1 ADMISSION DATE:  04/25/2018, CONSULTATION DATE:  04/25/2018 REFERRING MD:  Barry Dienes , CHIEF COMPLAINT:  Pancreatic cancer   Brief History   48 year old lady with pancreatic cancer Presented with a borderline resectable pancreatic head mass with significant invasion Has had chemotherapy Status post laparotomy  History of present illness   Postoperatively Hypotensive-on pressors Tachycardic Hemostasis was achieved during surgery  Past Medical History   Past Medical History:  Diagnosis Date  . Family history of lung cancer   . Family history of prostate cancer   . Family history of uterine cancer   . Gallstones 10/2017  . Pancreatic cancer ([place])   . Pneumothorax, closed, traumatic    years ago  . PONV (postoperative nausea and vomiting)      Significant Hospital Events   Post laparotomy 4/9   Consults:  PCCM 04/25/2018  Procedures:  Laparotomy-refer to surgical op note ETT 4/9 >>   Significant Diagnostic Tests:  CT abdomen and pelvis IMPRESSION: 1. Essentially stable exam compared to most recent CT scans of 02/04/2018 and 12/12/2017. 2. Stable mass in the head of the pancreas with duct dilatation and atrophy upstream. Mass continues to surround the GDA and contacts the portal vein but is free of the celiac trunk and SMA. Mass probably contacts the RIGHT hepatic artery at the takeoff of the GDA (image 56/5). Dominant LEFT hepatic artery originates from the LEFT gastric. 3. Mild intrahepatic and extrahepatic duct dilatation similar to prior with flexible stent in the common bile duct. 4. No new or progressive disease. 5. No peritoneal metastasis.  CT chest 4/9 >> no evidence of pulmonary embolism, scattered subcentimeter mediastinal lymph nodes, subcarinal lymphadenopathy.  No pneumothorax, bibasilar atelectasis.  Postsurgical changes in the abdomen  Head CT 4/9 >> no acute findings, chronic frontal  atrophy  Micro Data:  None  Antimicrobials:  Cefazolin 04/25/2018  Interim history/subjective:  Was on and off some norepinephrine overnight, off currently Tachycardic and appears uncomfortable Nods that she is having abdominal pain  Objective   Blood pressure 104/71, pulse (!) 150, temperature 99.1 F (37.3 C), temperature source Axillary, resp. rate (!) 30, height 4' 11"  (1.499 m), weight 56.7 kg, SpO2 97 %.    Vent Mode: PRVC FiO2 (%):  [40 %] 40 % Set Rate:  [16 bmp-24 bmp] 22 bmp Vt Set:  [450 mL] 450 mL PEEP:  [5 cmH20] 5 cmH20 Plateau Pressure:  [16 cmH20-21 cmH20] 18 cmH20   Intake/Output Summary (Last 24 hours) at 04/26/2018 1036 Last data filed at 04/26/2018 0800 Gross per 24 hour  Intake 5886.05 ml  Output 4065 ml  Net 1821.05 ml   Filed Weights   04/25/18 0615 04/25/18 0646  Weight: 56.7 kg 56.7 kg    Examination: General: Ill-appearing woman, intubated, uncomfortable HENT: ET tube in place, G-tube in place Lungs: Clear bilaterally, decreased at both bases Cardiovascular: Tachycardic, regular 150 Abdomen: Drains in place, soft, no bowel sounds heard Extremities: No significant lower extremity edema Neuro: Awake, nods to questions, moves bilateral upper extremities   Resolved Hospital Problem list   Respiratory acidosis, resolved  Assessment & Plan:  Status post laparotomy, Whipple procedure for pancreatic head cancer Management as per [facility], Dr Barry Dienes  Metabolic acidosis, presumed due to transient hypoperfusion Intra-Op, renal insufficiency Continue bicarbonate drip for another 24 hours as we await stabilization metabolically Follow ABG, BMP  Acute respiratory failure, mechanical ventilation -CT chest without any evidence of pulmonary embolism  Okay to try to initiate spontaneous breathing if she can tolerate based on her pain medication needs.  Would like to defer extubation until metabolic status is stabilized  Shock, due to SIRS, metabolic status,  sedating medications Wean norepinephrine to off as able Volume replacement ordered, albumin ordered Careful with sedating medications as able  Tachycardia, appears sinus tachy Believe in part due to discomfort.  She is on propofol but suspect that pain is driving this.  Consider a transition from propofol infusion to a narcotic infusion, low-dose.  Follow for response EKG if unclear that this is sinus rhythm  Acute renal failure, transient hypoperfusion Follow urine output, BMP  Acute transaminitis, due to transient hypoperfusion during surgery Follow LFT, coag    Best practice:  Diet: N.p.o. Pain/Anxiety/Delirium protocol (if indicated): Fentanyl VAP protocol (if indicated): In place DVT prophylaxis: scd GI prophylaxis: Protonix Glucose control:  Mobility: Bedrest Code Status: Full code Family Communication: No family at bedside Disposition: icu  Labs   CBC: Recent Labs  Lab 04/25/18 1539  04/25/18 1800 04/25/18 1802 04/25/18 1911 04/25/18 2017 04/26/18 0513 04/26/18 0813  WBC 17.6*  --  23.6*  --   --   --  14.8*  --   NEUTROABS  --   --  21.1*  --   --   --   --   --   HGB 12.4   < > 10.9* 11.6*  --  10.1* 8.9* 9.2*  HCT 39.0   < > 33.9* 34.0*  --  32.4* 27.0* 27.0*  MCV 70.7*  --  70.2*  --   --   --  66.8*  --   PLT 190  --  170  --  173  --  134*  --    < > = values in this interval not displayed.    Basic Metabolic Panel: Recent Labs  Lab 04/25/18 1539 04/25/18 1554 04/25/18 1800 04/25/18 1802 04/26/18 0513 04/26/18 0813  NA 140 142 141 141 141 140  K 4.6 4.5 4.3 4.2 4.9 4.8  CL 105  --  105  --  100  --   CO2 17*  --  16*  --  19*  --   GLUCOSE 246*  --  239*  --  127*  --   BUN 9  --  11  --  19  --   CREATININE 1.17*  --  1.11*  --  1.93*  --   CALCIUM 8.5*  --  7.9*  --  8.1*  --   MG 1.9  --   --   --  1.5*  --   PHOS 9.4*  --   --   --  7.5*  --    GFR: Estimated Creatinine Clearance: 27.1 mL/min (A) (by C-G formula based on SCr of  1.93 mg/dL (H)). Recent Labs  Lab 04/25/18 1539 04/25/18 1800 04/26/18 0513  WBC 17.6* 23.6* 14.8*    Liver Function Tests: Recent Labs  Lab 04/25/18 1539 04/26/18 0513  AST 200* 1,717*  ALT 168* 1,318*  ALKPHOS 200* 203*  BILITOT 2.1* 0.9  PROT 5.2* 5.8*  ALBUMIN 3.5 4.4   No results for input(s): LIPASE, AMYLASE in the last 168 hours. No results for input(s): AMMONIA in the last 168 hours.  ABG    Component Value Date/Time   PHART 7.466 (H) 04/26/2018 0813   PCO2ART 34.9 04/26/2018 0813   PO2ART 108.0 04/26/2018 0813   HCO3 25.2 04/26/2018 0813   TCO2 26  04/26/2018 0813   ACIDBASEDEF 10.7 (H) 04/25/2018 2119   O2SAT 99.0 04/26/2018 0813     Coagulation Profile: Recent Labs  Lab 04/25/18 1354 04/25/18 1911 04/26/18 0513  INR 1.3* 1.4* 1.8*    Cardiac Enzymes: No results for input(s): CKTOTAL, CKMB, CKMBINDEX, TROPONINI in the last 168 hours.  HbA1C: No results found for: HGBA1C  CBG: Recent Labs  Lab 04/25/18 1536 04/25/18 2007 04/25/18 2308 04/26/18 0309 04/26/18 0734  GLUCAP 219* 126* 70 136* 118*     Critical care time: 35 minutes      Baltazar Apo, MD, PhD 04/26/2018, 10:49 AM [facility] [phone] or if no answer [phone]

## 2018-04-26 NOTE — Progress Notes (Signed)
Initial Nutrition Assessment  DOCUMENTATION CODES:   Not applicable  INTERVENTION:   Recommend initiate Osmolite 1.5 @ 20 ml/hr and increase as tolerated to goal rate of 41 ml/hr Add 30 ml Prostat BID  Provides: 1676 kcal, 91 grams protein, and 749 ml   NUTRITION DIAGNOSIS:   Increased nutrient needs related to cancer and cancer related treatments as evidenced by estimated needs.  GOAL:   Patient will meet greater than or equal to 90% of their needs  MONITOR:   Diet advancement, I & O's, Labs  REASON FOR ASSESSMENT:   Ventilator    ASSESSMENT:   48 year old female who presented on 4/09 for diagnostic laparoscopy, pancreaticoduodenectomy, portal vein reconstruction, placement of pancreatic stent, and placement of J-tube. PMH of adenoma of the pancreatic head and had been receiving chemotherapy.  4/10 pt self extubated, NG tube replaced  Weight appears very stable since cancer dx in 10/19.   Per surgery will wait a few days before feeding.   Pt is a smoker.   Medications reviewed and include: D5 LR @ 100 ml/hr, 150 mEq Nabicarb @ 40 ml/hr Labs reviewed: PO4: 7.5 (H), Magnesium 1.5 (L); AST: 1717 (H), ALT: 1318 (H)  Drain 1: 525 ml Drain 2: 1300 ml  NG tube pulled out but replaced  J tube in place  NUTRITION - FOCUSED PHYSICAL EXAM:  Deferred  Diet Order:   Diet Order            Diet NPO time specified  Diet effective now              EDUCATION NEEDS:   Not appropriate for education at this time  Skin:  Skin Assessment: Skin Integrity Issues: Skin Integrity Issues:: Incisions Incisions: closed abdomen  Last BM:  04/25/18 300 ml x 24 hours per rectal tube  Height:   Ht Readings from Last 1 Encounters:  04/25/18 4' 11"  (1.499 m)    Weight:   Wt Readings from Last 1 Encounters:  04/25/18 56.7 kg   Wt Readings from Last 20 Encounters:  04/25/18 56.7 kg  04/17/18 58.7 kg  04/05/18 58.5 kg  03/20/18 56.8 kg  03/06/18 56.5 kg  02/20/18 57.5  kg  02/05/18 55.8 kg  02/02/18 56.7 kg  01/23/18 56.4 kg  01/14/18 56.2 kg  12/18/17 56.6 kg  12/12/17 56.9 kg  12/04/17 56.9 kg  11/27/17 56.6 kg  11/26/17 55.8 kg  11/19/17 58 kg  11/15/17 57.9 kg  11/09/17 57.9 kg  11/08/17 57.6 kg  10/31/17 57.6 kg    Ideal Body Weight:  43.2 kg  BMI:  Body mass index is 25.25 kg/m.  Estimated Nutritional Needs:   Kcal:  1600-1900  Protein:  70-90 grams  Fluid:  > 1.7 L/day  Maylon Peppers RD, LDN, CNSC [phone] Pager [phone] After Hours Pager

## 2018-04-27 ENCOUNTER — Inpatient Hospital Stay (HOSPITAL_COMMUNITY): Payer: Medicaid Other

## 2018-04-27 DIAGNOSIS — N170 Acute kidney failure with tubular necrosis: Secondary | ICD-10-CM

## 2018-04-27 LAB — HEMOGLOBIN AND HEMATOCRIT, BLOOD
HCT: 27.4 % — ABNORMAL LOW (ref 36.0–46.0)
Hemoglobin: 9.3 g/dL — ABNORMAL LOW (ref 12.0–15.0)

## 2018-04-27 LAB — TYPE AND SCREEN
ABO/RH(D): A POS
Antibody Screen: NEGATIVE
Unit division: 0
Unit division: 0
Unit division: 0
Unit division: 0

## 2018-04-27 LAB — POCT I-STAT 7, (LYTES, BLD GAS, ICA,H+H)
Acid-Base Excess: 6 mmol/L — ABNORMAL HIGH (ref 0.0–2.0)
Bicarbonate: 29.4 mmol/L — ABNORMAL HIGH (ref 20.0–28.0)
Calcium, Ion: 1.01 mmol/L — ABNORMAL LOW (ref 1.15–1.40)
HCT: 24 % — ABNORMAL LOW (ref 36.0–46.0)
Hemoglobin: 8.2 g/dL — ABNORMAL LOW (ref 12.0–15.0)
O2 Saturation: 96 %
Patient temperature: 98.6
Potassium: 4 mmol/L (ref 3.5–5.1)
Sodium: 144 mmol/L (ref 135–145)
TCO2: 31 mmol/L (ref 22–32)
pCO2 arterial: 36 mmHg (ref 32.0–48.0)
pH, Arterial: 7.52 — ABNORMAL HIGH (ref 7.350–7.450)
pO2, Arterial: 73 mmHg — ABNORMAL LOW (ref 83.0–108.0)

## 2018-04-27 LAB — BASIC METABOLIC PANEL
Anion gap: 21 — ABNORMAL HIGH (ref 5–15)
BUN: 38 mg/dL — ABNORMAL HIGH (ref 6–20)
CO2: 24 mmol/L (ref 22–32)
Calcium: 8.3 mg/dL — ABNORMAL LOW (ref 8.9–10.3)
Chloride: 94 mmol/L — ABNORMAL LOW (ref 98–111)
Creatinine, Ser: 3.28 mg/dL — ABNORMAL HIGH (ref 0.44–1.00)
GFR calc Af Amer: 18 mL/min — ABNORMAL LOW (ref >60)
GFR calc non Af Amer: 16 mL/min — ABNORMAL LOW (ref >60)
Glucose, Bld: 124 mg/dL — ABNORMAL HIGH (ref 70–99)
Potassium: 4.6 mmol/L (ref 3.5–5.1)
Sodium: 139 mmol/L (ref 135–145)

## 2018-04-27 LAB — COMPREHENSIVE METABOLIC PANEL
ALT: 1534 U/L — ABNORMAL HIGH (ref 0–44)
ALT: 1016 U/L — ABNORMAL HIGH (ref 0–44)
AST: 3354 U/L — ABNORMAL HIGH (ref 15–41)
AST: 1840 U/L — ABNORMAL HIGH (ref 15–41)
Albumin: 4.4 g/dL (ref 3.5–5.0)
Albumin: 4.6 g/dL (ref 3.5–5.0)
Alkaline Phosphatase: 300 U/L — ABNORMAL HIGH (ref 38–126)
Alkaline Phosphatase: 202 U/L — ABNORMAL HIGH (ref 38–126)
Anion gap: 16 — ABNORMAL HIGH (ref 5–15)
Anion gap: 17 — ABNORMAL HIGH (ref 5–15)
BUN: 49 mg/dL — ABNORMAL HIGH (ref 6–20)
BUN: 54 mg/dL — ABNORMAL HIGH (ref 6–20)
CO2: 28 mmol/L (ref 22–32)
CO2: 24 mmol/L (ref 22–32)
Calcium: 8.5 mg/dL — ABNORMAL LOW (ref 8.9–10.3)
Calcium: 8.5 mg/dL — ABNORMAL LOW (ref 8.9–10.3)
Chloride: 99 mmol/L (ref 98–111)
Chloride: 102 mmol/L (ref 98–111)
Creatinine, Ser: 3.85 mg/dL — ABNORMAL HIGH (ref 0.44–1.00)
Creatinine, Ser: 4.02 mg/dL — ABNORMAL HIGH (ref 0.44–1.00)
GFR calc Af Amer: 15 mL/min — ABNORMAL LOW (ref >60)
GFR calc Af Amer: 14 mL/min — ABNORMAL LOW (ref >60)
GFR calc non Af Amer: 13 mL/min — ABNORMAL LOW (ref >60)
GFR calc non Af Amer: 12 mL/min — ABNORMAL LOW (ref >60)
Glucose, Bld: 101 mg/dL — ABNORMAL HIGH (ref 70–99)
Glucose, Bld: 153 mg/dL — ABNORMAL HIGH (ref 70–99)
Potassium: 3.9 mmol/L (ref 3.5–5.1)
Potassium: 4 mmol/L (ref 3.5–5.1)
Sodium: 143 mmol/L (ref 135–145)
Sodium: 143 mmol/L (ref 135–145)
Total Bilirubin: 2 mg/dL — ABNORMAL HIGH (ref 0.3–1.2)
Total Bilirubin: 1.8 mg/dL — ABNORMAL HIGH (ref 0.3–1.2)
Total Protein: 5.6 g/dL — ABNORMAL LOW (ref 6.5–8.1)
Total Protein: 5.6 g/dL — ABNORMAL LOW (ref 6.5–8.1)

## 2018-04-27 LAB — PREPARE FRESH FROZEN PLASMA
Unit division: 0
Unit division: 0

## 2018-04-27 LAB — BPAM RBC
Blood Product Expiration Date: 202004172359
Blood Product Expiration Date: 202004172359
Blood Product Expiration Date: 202004172359
Blood Product Expiration Date: 202004172359
ISSUE DATE / TIME: 202004091104
ISSUE DATE / TIME: 202004091104
ISSUE DATE / TIME: 202004101434
ISSUE DATE / TIME: 202004101641
Unit Type and Rh: 6200
Unit Type and Rh: 6200
Unit Type and Rh: 6200
Unit Type and Rh: 6200

## 2018-04-27 LAB — CBC
HCT: 28.7 % — ABNORMAL LOW (ref 36.0–46.0)
HCT: 23.7 % — ABNORMAL LOW (ref 36.0–46.0)
Hemoglobin: 10 g/dL — ABNORMAL LOW (ref 12.0–15.0)
Hemoglobin: 8 g/dL — ABNORMAL LOW (ref 12.0–15.0)
MCH: 25 pg — ABNORMAL LOW (ref 26.0–34.0)
MCH: 24.5 pg — ABNORMAL LOW (ref 26.0–34.0)
MCHC: 34.8 g/dL (ref 30.0–36.0)
MCHC: 33.8 g/dL (ref 30.0–36.0)
MCV: 71.8 fL — ABNORMAL LOW (ref 80.0–100.0)
MCV: 72.5 fL — ABNORMAL LOW (ref 80.0–100.0)
Platelets: 83 10*3/uL — ABNORMAL LOW (ref 150–400)
Platelets: 64 10*3/uL — ABNORMAL LOW (ref 150–400)
RBC: 4 MIL/uL (ref 3.87–5.11)
RBC: 3.27 MIL/uL — ABNORMAL LOW (ref 3.87–5.11)
RDW: 19.2 % — ABNORMAL HIGH (ref 11.5–15.5)
RDW: 19.3 % — ABNORMAL HIGH (ref 11.5–15.5)
WBC: 18 10*3/uL — ABNORMAL HIGH (ref 4.0–10.5)
WBC: 17.6 10*3/uL — ABNORMAL HIGH (ref 4.0–10.5)
nRBC: 0.5 % — ABNORMAL HIGH (ref 0.0–0.2)
nRBC: 0.5 % — ABNORMAL HIGH (ref 0.0–0.2)

## 2018-04-27 LAB — BPAM FFP
Blood Product Expiration Date: 202004122359
Blood Product Expiration Date: 202004122359
ISSUE DATE / TIME: 202004100740
ISSUE DATE / TIME: 202004101009
Unit Type and Rh: 6200
Unit Type and Rh: 6200

## 2018-04-27 LAB — GLUCOSE, CAPILLARY
Glucose-Capillary: 123 mg/dL — ABNORMAL HIGH (ref 70–99)
Glucose-Capillary: 120 mg/dL — ABNORMAL HIGH (ref 70–99)
Glucose-Capillary: 124 mg/dL — ABNORMAL HIGH (ref 70–99)
Glucose-Capillary: 125 mg/dL — ABNORMAL HIGH (ref 70–99)
Glucose-Capillary: 125 mg/dL — ABNORMAL HIGH (ref 70–99)
Glucose-Capillary: 90 mg/dL (ref 70–99)

## 2018-04-27 LAB — PROTIME-INR
INR: 1.8 — ABNORMAL HIGH (ref 0.8–1.2)
Prothrombin Time: 20.2 seconds — ABNORMAL HIGH (ref 11.4–15.2)

## 2018-04-27 LAB — PHOSPHORUS: Phosphorus: 4.1 mg/dL (ref 2.5–4.6)

## 2018-04-27 LAB — MAGNESIUM: Magnesium: 2.3 mg/dL (ref 1.7–2.4)

## 2018-04-27 MED ORDER — METHOCARBAMOL 1000 MG/10ML IJ SOLN
500.0000 mg | Freq: Four times a day (QID) | INTRAVENOUS | Status: DC
  Administered 2018-04-27 – 2018-05-02 (×19): 500 mg via INTRAVENOUS
  Filled 2018-04-27: qty 5
  Filled 2018-04-27: qty 500
  Filled 2018-04-27 (×5): qty 5
  Filled 2018-04-27: qty 500
  Filled 2018-04-27 (×3): qty 5
  Filled 2018-04-27 (×2): qty 500
  Filled 2018-04-27 (×3): qty 5
  Filled 2018-04-27: qty 500
  Filled 2018-04-27 (×8): qty 5

## 2018-04-27 MED ORDER — SODIUM CHLORIDE 0.9% IV SOLUTION
Freq: Once | INTRAVENOUS | Status: AC
  Administered 2018-04-27: 11:00:00 via INTRAVENOUS

## 2018-04-27 MED ORDER — ORAL CARE MOUTH RINSE: 15.0000 mL | Freq: Two times a day (BID) | OROMUCOSAL | Status: DC

## 2018-04-27 MED ORDER — WHITE PETROLATUM EX OINT
TOPICAL_OINTMENT | CUTANEOUS | Status: AC
  Administered 2018-04-27: 15:00:00
  Filled 2018-04-27: qty 28.35

## 2018-04-27 MED ORDER — ORAL CARE MOUTH RINSE
15.0000 mL | Freq: Two times a day (BID) | OROMUCOSAL | Status: DC
  Administered 2018-04-27 – 2018-04-30 (×8): 15 mL via OROMUCOSAL

## 2018-04-27 NOTE — Progress Notes (Signed)
2 Days Post-Op   Subjective/Chief Complaint: Wants water, pain under fair control, self extubated today   Objective: Vital signs in last 24 hours: Temp:  [97.9 F (36.6 C)-99.5 F (37.5 C)] 99.4 F (37.4 C) (04/11 0800) Pulse Rate:  [133-152] 150 (04/11 0800) Resp:  [22-55] 33 (04/11 0800) BP: (104-201)/(57-170) 124/87 (04/11 0800) SpO2:  [83 %-100 %] 99 % (04/11 0800) Arterial Line BP: (96-196)/(57-94) 161/58 (04/11 0800) Last BM Date: 04/26/18  Intake/Output from previous day: 04/10 0701 - 04/11 0700 In: 4568.7 [I.V.:1732.1; Blood:1373.8; IV Piggyback:1391.6] Out: 4332 [Urine:625; Emesis/NG output:300; Drains:2250; Stool:300] Intake/Output this shift: Total I/O In: 145.5 [I.V.:120.5; Other:5; IV Piggyback:20] Out: 400 [Drains:400]   General appearance: ill follow comands Resp: decreased bases, not taking large volumes on is Cardio: regular, tachycardic GI:  distended, dressing c/d/i.  drains with bloody ascites, but serosanguinous.  N Extremities: extremities normalno cyanosis or edema Pulses: palpable  Lab Results:  Recent Labs    04/26/18 0513  04/27/18 0011 04/27/18 0444  WBC 14.8*  --   --  18.0*  HGB 8.9*   < > 9.3* 10.0*  HCT 27.0*   < > 27.4* 28.7*  PLT 134*  --   --  83*   < > = values in this interval not displayed.   BMET Recent Labs    04/26/18 1614 04/26/18 1810 04/27/18 0444  NA 139 140 143  K 4.6 4.6 3.9  CL 94*  --  99  CO2 24  --  28  GLUCOSE 124*  --  101*  BUN 38*  --  49*  CREATININE 3.28*  --  3.85*  CALCIUM 8.3*  --  8.5*   PT/INR Recent Labs    04/26/18 0513 04/27/18 0444  LABPROT 20.4* 20.2*  INR 1.8* 1.8*   ABG Recent Labs    04/26/18 0813 04/26/18 1810  PHART 7.466* 7.533*  HCO3 25.2 30.4*    Studies/Results: Dg Chest 1 View  Result Date: 04/25/2018 CLINICAL DATA:  Status post repositioning of endotracheal tube. EXAM: CHEST  1 VIEW COMPARISON:  Single-view of the chest earlier today. FINDINGS: Endotracheal  tube is been pulled back with the tip now 1.7 cm above the carina. No other change. IMPRESSION: As above. Electronically Signed   By: Inge Rise M.D.   On: 04/25/2018 14:57   Dg Chest 1 View  Result Date: 04/25/2018 CLINICAL DATA:  Status post ET tube placement. EXAM: CHEST  1 VIEW COMPARISON:  CT chest 11/15/2017. Single-view of the chest 11/08/2017. FINDINGS: Endotracheal tube is in place with the tip just within the right mainstem bronchus. NG tube tip is in good position in the stomach. Right Port-A-Cath noted. Lungs are clear. Heart size is normal. No pneumothorax or pleural effusion. Remote left rib fractures noted. IMPRESSION: ET tube is in the right mainstem bronchus. NG tube in good position. Lungs clear. Electronically Signed   By: Inge Rise M.D.   On: 04/25/2018 14:57   Ct Head Wo Contrast  Result Date: 04/25/2018 CLINICAL DATA:  Encephalopathy with sluggish pupils EXAM: CT HEAD WITHOUT CONTRAST TECHNIQUE: Contiguous axial images were obtained from the base of the skull through the vertex without intravenous contrast. COMPARISON:  None. FINDINGS: Brain: The ventricles are normal in size and configuration. There is symmetric frontal atrophy bilaterally. There is no intracranial mass, hemorrhage, extra-axial fluid collection, or midline shift. Brain parenchyma appears unremarkable. No evident acute infarct. Vascular: There is no hyperdense vessel. There is no appreciable vascular calcification. Skull: Bony calvarium  appears intact. Sinuses/Orbits: Visualized paranasal sinuses are clear. Visualized orbits appear symmetric bilaterally. Other: Mastoid air cells are clear. IMPRESSION: Frontal atrophy bilaterally. Ventricles normal in size and configuration. Brain parenchyma appears unremarkable. No acute infarct. No mass or hemorrhage. Electronically Signed   By: Lowella Grip III M.D.   On: 04/25/2018 19:06   Ct Angio Chest Pe W Or Wo Contrast  Addendum Date: 04/25/2018   ADDENDUM  REPORT: 04/25/2018 19:49 ADDENDUM: Comment: By report, the mass has been removed from the head of the pancreas. 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 The stent is actually a pancreatic stent, no longer extending into the biliary duct system. The air in the biliary ductal system is felt to be due to the Whipple procedure and stent placement. Electronically Signed   By: Lowella Grip III M.D.   On: 04/25/2018 19:49   Result Date: 04/25/2018 CLINICAL DATA:  Hypoxia EXAM: CT ANGIOGRAPHY CHEST WITH CONTRAST TECHNIQUE: Multidetector CT imaging of the chest was performed using the standard protocol during bolus administration of intravenous contrast. Multiplanar CT image reconstructions and MIPs were obtained to evaluate the vascular anatomy. CONTRAST:  49m OMNIPAQUE IOHEXOL 350 MG/ML SOLN COMPARISON:  Chest CT November 15, 2017; chest radiograph April 25, 2018 FINDINGS: Cardiovascular: There is no demonstrable pulmonary embolus. There is no thoracic aortic aneurysm or dissection. Visualized great vessels appear unremarkable. There is no pericardial effusion or pericardial thickening. There is left ventricular hypertrophy. Mediastinum/Nodes: Thyroid appears unremarkable. There are scattered subcentimeter mediastinal lymph nodes. There is a subcarinal lymph node measuring 1.7 x 1.4 cm. No other lymph node enlargement is demonstrable. Nasogastric tube passes through the esophagus into the stomach. There is a small hiatal hernia. Lungs/Pleura: Endotracheal tube tip is just above the carina. No pneumothorax. There is atelectatic change in the lower lung regions. There is no edema or consolidation. No appreciable pleural effusion. Upper Abdomen: There is a biliary stent. Air within the biliary ductal system is due to previous sphincterotomy. There is a mass arising in the pancreatic head region measuring 4.4 x 4.7 cm. A drainage catheter is noted in the upper abdomen.  There are foci of pneumoperitoneum. There is mild upper abdominal ascites. Musculoskeletal: No blastic or lytic bone lesions are evident. There are no chest wall lesions. Review of the MIP images confirms the above findings. IMPRESSION: 1. No demonstrable pulmonary embolus. No thoracic aortic aneurysm or dissection. 2. Areas of atelectatic change bilaterally. No consolidation. No pneumothorax. Endotracheal tube tip is slightly superior to the carina. 3. Enlarged subcarinal lymph node, concerning for neoplastic etiology. 4. 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 Biliary stent present. Biliary duct air is felt to be secondary to the stent placement. 5. Mild upper abdominal ascites. Pneumoperitoneum with apparent drainage catheter present. The pneumoperitoneum is likely of post procedural/postoperative etiology. Bowel perforation in this circumstance cannot be excluded entirely, however. Close clinical assessment in this regard advised. 6.  Left ventricular hypertrophy. Electronically Signed: By: WLowella GripIII M.D. On: 04/25/2018 19:04   Dg Chest Port 1 View  Result Date: 04/26/2018 CLINICAL DATA:  Status post repositioning of endotracheal tube. EXAM: PORTABLE CHEST 1 VIEW COMPARISON:  CT chest and single view of the chest 04/25/2018. FINDINGS: Endotracheal tube has been pulled back with the tip now in good position at the level of clavicular heads. NG tube tip remains in the stomach. Lungs are clear. Heart size is normal. No pneumothorax or pleural fluid. Remote  left rib fractures noted. No acute bony abnormality. IMPRESSION: Endotracheal tube is now in good position.  Lungs are clear. Electronically Signed   By: Inge Rise M.D.   On: 04/26/2018 08:37   Dg Abd Portable 1v  Result Date: 04/26/2018 CLINICAL DATA:  Status post NG tube placement. EXAM: PORTABLE ABDOMEN - 1 VIEW COMPARISON:  None. FINDINGS: NG tube is in place with both the tip and side-port in the stomach. The  patient has undergone laparotomy since the prior examination. Surgical drains are in place. IMPRESSION: NG tube in good position. Electronically Signed   By: Inge Rise M.D.   On: 04/26/2018 13:27    Anti-infectives: Anti-infectives (From admission, onward)   Start     Dose/Rate Route Frequency Ordered Stop   04/25/18 1530  ceFAZolin (ANCEF) IVPB 2g/100 mL premix     2 g 200 mL/hr over 30 Minutes Intravenous Every 8 hours 04/25/18 1520 04/25/18 1739   04/25/18 0600  ceFAZolin (ANCEF) IVPB 2g/100 mL premix     2 g 200 mL/hr over 30 Minutes Intravenous On call to O.R. 04/25/18 0545 04/25/18 1149      Assessment/Plan: POD 2 Whipple- [place] 1. Neuro- continue epidural and prn pain meds, will do scheduled robaxin today as well 2. CV/Pulm- concern for reintubation but is maintaining now, tachycardia does not appear to be from blood loss, is somewhat better with pain control, likely stress response 3. GI- continue ng tube, drains as expected, lfts up higher now, by op report had good pulse and doppler in ha and pv at completion of case, will try to discuss with Dr Barry Dienes today, I have ordered liver US to assess patency of pv and arterial flow given increase and her continued picture especially with increasing cr 4. Renal- cr continues to increase, making urine, will have nephrology see today, back on bicarb?, continued albumin boluses per Dr Barry Dienes for total 48 hours 5. Heme- 2 u ffp today for bloody fluid in drains, volume with elevated inr 6. Endocrine- continue ssi, glucose under good control   Rolm Bookbinder 04/27/2018

## 2018-04-27 NOTE — Consult Note (Signed)
[facility]  HISTORY AND PHYSICAL  Jane Brooks is an 48 y.o. female.    Chief Complaint: s/p Whipple  HPI: Pt is a 4F with a PMH significant for pancreatic cancer s/p neoadjuvant chemo and smoking history who underwent Whipple procedure 04/25/2018 with Dr. Barry Dienes.  Procedure involved portal vein reconstruction and approximately 1500 mL EBL.  SMV/portal vein clamped for 30 min intraoperatively.  Postoperatively, pt was hypotensive, requiring pressors, and had sig third spacing of fluids for which she received colloid and crystalloid resuscitation.  LFTs up and had hepatic dopplers today which showed patent hepatic and portal veins.  Drain output has been fairly high- had 2.2L out yesterday.  UOP has been marginal but adequate, 500-600 mL daily.  Had a CT angio 4/9 postoperatively without PE.   Pt self-extubated today.  Reporting dry throat and significant pain.  + tachycardia and now hypertensive.    Baseline creatinine 0.6 as of 04/17/2018.  Cr 1.17 4/9 --> 1.93 4/10 --> 3.85 today.  In this setting we are asked to see.    PMH: Past Medical History:  Diagnosis Date  . Family history of lung cancer   . Family history of prostate cancer   . Family history of uterine cancer   . Gallstones 10/2017  . Pancreatic cancer ([place])   . Pneumothorax, closed, traumatic    years ago  . PONV (postoperative nausea and vomiting)    PSH: Past Surgical History:  Procedure Laterality Date  . BILIARY STENT PLACEMENT  10/31/2017   Procedure: BILIARY STENT PLACEMENT;  Surgeon: [name], MD;  Location: [facility] ENDOSCOPY;  Service: Gastroenterology;;  . ERCP N/A 10/31/2017   Procedure: ENDOSCOPIC RETROGRADE CHOLANGIOPANCREATOGRAPHY (ERCP);  Surgeon: [name], MD;  Location: [facility] ENDOSCOPY;  Service: Gastroenterology;  Laterality: N/A;  . ESOPHAGOGASTRODUODENOSCOPY N/A 11/08/2017   Procedure: ESOPHAGOGASTRODUODENOSCOPY (EGD);  Surgeon: Milus Banister, MD;  Location: [facility] ENDOSCOPY;  Service:  Endoscopy;  Laterality: N/A;  . EUS N/A 11/08/2017   Procedure: UPPER ENDOSCOPIC ULTRASOUND (EUS) RADIAL;  Surgeon: Milus Banister, MD;  Location: [facility] ENDOSCOPY;  Service: Endoscopy;  Laterality: N/A;  . FINE NEEDLE ASPIRATION  11/08/2017   Procedure: FINE NEEDLE ASPIRATION;  Surgeon: Milus Banister, MD;  Location: [facility] ENDOSCOPY;  Service: Endoscopy;;  . IR IMAGING GUIDED PORT INSERTION  11/15/2017  . LAPAROSCOPY N/A 04/25/2018   Procedure: LAPAROSCOPY DIAGNOSTIC;  Surgeon: Stark Klein, MD;  Location: [place];  Service: General;  Laterality: N/A;  GENERAL AND EPIDURAL ANESTHESIA  . SPHINCTEROTOMY  10/31/2017   Procedure: SPHINCTEROTOMY;  Surgeon: [name], MD;  Location: [facility] ENDOSCOPY;  Service: Gastroenterology;;  . TONSILLECTOMY    . WHIPPLE PROCEDURE N/A 04/25/2018   Procedure: WHIPPLE PROCEDURE, RECONSTRUCTION OF PORTAL VEIN;  Surgeon: Stark Klein, MD;  Location: [place];  Service: General;  Laterality: N/A;     Past Medical History:  Diagnosis Date  . Family history of lung cancer   . Family history of prostate cancer   . Family history of uterine cancer   . Gallstones 10/2017  . Pancreatic cancer ([place])   . Pneumothorax, closed, traumatic    years ago  . PONV (postoperative nausea and vomiting)     Medications:   Scheduled: . sodium chloride   Intravenous Once  . Chlorhexidine Gluconate Cloth  6 each Topical Daily  . insulin aspart  0-9 Units Subcutaneous Q4H  . lidocaine (PF)  5 mL Other Once  . mouth rinse  15 mL Mouth Rinse BID  . pantoprazole (PROTONIX)  IV  40 mg Intravenous QHS  . scopolamine  1 patch Transdermal Once  . sodium chloride flush  10-40 mL Intracatheter Q12H    Medications Prior to Admission  Medication Sig Dispense Refill  . cyclobenzaprine (FLEXERIL) 5 MG tablet Take 1 tablet (5 mg total) by mouth 3 (three) times daily as needed for muscle spasms. 30 tablet 2  . lidocaine-prilocaine (EMLA) cream Apply 1 application topically as needed. 30 g 2  .  morphine (MS CONTIN) 15 MG 12 hr tablet Take 1 tablet (15 mg total) by mouth every 12 (twelve) hours. 60 tablet 0  . morphine (MSIR) 15 MG tablet Take 1 tablet (15 mg total) by mouth every 6 (six) hours as needed for severe pain. 120 tablet 0  . ondansetron (ZOFRAN) 8 MG tablet Take 1 tablet (8 mg total) by mouth every 8 (eight) hours as needed for nausea or vomiting. 30 tablet 0  . prochlorperazine (COMPAZINE) 10 MG tablet Take 1 tablet (10 mg total) by mouth every 6 (six) hours as needed for nausea or vomiting. 30 tablet 3  . promethazine (PHENERGAN) 25 MG tablet Take 1 tablet (25 mg total) by mouth every 6 (six) hours as needed for nausea or vomiting. 30 tablet 0  . DULoxetine (CYMBALTA) 20 MG capsule Take 1 capsule (20 mg total) by mouth 2 (two) times daily. Take once daily for 1 week then increase to BID (Patient not taking: Reported on 04/08/2018) 60 capsule 3  . loperamide (IMODIUM) 2 MG capsule Take 2 capsules (4 mg total) by mouth every 6 (six) hours as needed for diarrhea or loose stools. (Patient not taking: Reported on 04/08/2018) 60 capsule 0  . loratadine (CLARITIN) 10 MG tablet Take 1 tablet (10 mg total) by mouth daily. Take for 5 days after the Udenyca injection (Patient not taking: Reported on 04/08/2018) 30 tablet 2    ALLERGIES:   Allergies  Allergen Reactions  . Latex Hives    FAM HX: Family History  Problem Relation Age of Onset  . Diabetes Mother   . Hypertension Mother   . COPD Mother   . Uterine cancer Mother 72       had hysterectomy  . Diabetes Sister   . Hypertension Sister   . Lung cancer Maternal Grandmother        lung cancer  . Hypertension Sister     Social History:   reports that she has been smoking cigarettes. She has a 37.00 pack-year smoking history. She has never used smokeless tobacco. She reports previous alcohol use. She reports that she does not use drugs.  ROS: ROS: all systems reviewed and are negative except as per HPI  Blood pressure  (!) 153/82, pulse (!) 134, temperature 98.8 F (37.1 C), temperature source Oral, resp. rate (!) 35, height 4' 11"  (1.499 m), weight 56.7 kg, SpO2 99 %. PHYSICAL EXAM: Physical Exam  GEN appears uncomfortable, sitting up in bed HEENT hoarse voice, very sl jaundice NECK bounding carotid pulse, no JVD PULM tachypneic, no c/w/r CV tachycardic ABD + some abd wall edema, soft overall, no BS, midline staples intact, 2 RUQ drains with serosanguinous fluid, Jtube with minimal brownish drainage, NGT with brown/green/coffee grounds.  Foley with some amber urine EXT 1+ anasarca NEURO AAO x 3    Results for orders placed or performed during the hospital encounter of 04/25/18 (from the past 48 hour(s))  MRSA PCR Screening     Status: None   Collection Time: 04/25/18  3:17 PM  Result Value Ref Range   MRSA by PCR NEGATIVE NEGATIVE    Comment:        The GeneXpert MRSA Assay (FDA approved for NASAL specimens only), is one component of a comprehensive MRSA colonization surveillance program. It is not intended to diagnose MRSA infection nor to guide or monitor treatment for MRSA infections. Performed at [facility], [place] [address], [place], [place] [address]   Glucose, capillary     Status: Abnormal   Collection Time: 04/25/18  3:36 PM  Result Value Ref Range   Glucose-Capillary 219 (H) 70 - 99 mg/dL  CBC     Status: Abnormal   Collection Time: 04/25/18  3:39 PM  Result Value Ref Range   WBC 17.6 (H) 4.0 - 10.5 K/uL   RBC 5.52 (H) 3.87 - 5.11 MIL/uL   Hemoglobin 12.4 12.0 - 15.0 g/dL   HCT 39.0 36.0 - 46.0 %   MCV 70.7 (L) 80.0 - 100.0 fL   MCH 22.5 (L) 26.0 - 34.0 pg   MCHC 31.8 30.0 - 36.0 g/dL   RDW 17.0 (H) 11.5 - 15.5 %   Platelets 190 150 - 400 K/uL   nRBC 0.4 (H) 0.0 - 0.2 %    Comment: Performed at [facility], [place] [address] [place], [place] [address]  Comprehensive metabolic panel     Status: Abnormal   Collection Time: 04/25/18  3:39 PM  Result Value Ref  Range   Sodium 140 135 - 145 mmol/L   Potassium 4.6 3.5 - 5.1 mmol/L   Chloride 105 98 - 111 mmol/L   CO2 17 (L) 22 - 32 mmol/L   Glucose, Bld 246 (H) 70 - 99 mg/dL   BUN 9 6 - 20 mg/dL   Creatinine, Ser 1.17 (H) 0.44 - 1.00 mg/dL   Calcium 8.5 (L) 8.9 - 10.3 mg/dL   Total Protein 5.2 (L) 6.5 - 8.1 g/dL   Albumin 3.5 3.5 - 5.0 g/dL   AST 200 (H) 15 - 41 U/L   ALT 168 (H) 0 - 44 U/L   Alkaline Phosphatase 200 (H) 38 - 126 U/L   Total Bilirubin 2.1 (H) 0.3 - 1.2 mg/dL   GFR calc non Af Amer 55 (L) >60 mL/min   GFR calc Af Amer >60 >60 mL/min   Anion gap 18 (H) 5 - 15    Comment: Performed at [facility], [place] [address] [place], [place] [address]  Magnesium     Status: None   Collection Time: 04/25/18  3:39 PM  Result Value Ref Range   Magnesium 1.9 1.7 - 2.4 mg/dL    Comment: Performed at [facility], [place] [address], [place], [place] [address]  Phosphorus     Status: Abnormal   Collection Time: 04/25/18  3:39 PM  Result Value Ref Range   Phosphorus 9.4 (H) 2.5 - 4.6 mg/dL    Comment: Performed at [facility] [address], [place], [place] [address]  I-STAT 7, (LYTES, BLD GAS, ICA, H+H)     Status: Abnormal   Collection Time: 04/25/18  3:54 PM  Result Value Ref Range   pH, Arterial 7.137 (LL) 7.350 - 7.450   pCO2 arterial 48.3 (H) 32.0 - 48.0 mmHg   pO2, Arterial 132.0 (H) 83.0 - 108.0 mmHg   Bicarbonate 16.5 (L) 20.0 - 28.0 mmol/L   TCO2 18 (L) 22 - 32 mmol/L   O2 Saturation 98.0 %   Acid-base  deficit 13.0 (H) 0.0 - 2.0 mmol/L   Sodium 142 135 - 145 mmol/L   Potassium 4.5 3.5 - 5.1 mmol/L   Calcium, Ion 1.04 (L) 1.15 - 1.40 mmol/L   HCT 34.0 (L) 36.0 - 46.0 %   Hemoglobin 11.6 (L) 12.0 - 15.0 g/dL   Patient temperature 96.9 F    Collection site RADIAL, ALLEN'S TEST ACCEPTABLE    Drawn by VP    Sample type ARTERIAL    Comment NOTIFIED PHYSICIAN   Prepare fresh frozen plasma     Status: None   Collection Time: 04/25/18  3:58 PM  Result Value Ref  Range   Unit Number [id number]    Blood Component Type THAWED PLASMA    Unit division 00    Status of Unit ISSUED,FINAL    Transfusion Status OK TO TRANSFUSE    Unit Number [id number]    Blood Component Type THAWED PLASMA    Unit division 00    Status of Unit ISSUED,FINAL    Transfusion Status      OK TO TRANSFUSE Performed at [facility], [address], [place], [place] [address]   CBC with Differential/Platelet     Status: Abnormal   Collection Time: 04/25/18  6:00 PM  Result Value Ref Range   WBC 23.6 (H) 4.0 - 10.5 K/uL   RBC 4.83 3.87 - 5.11 MIL/uL   Hemoglobin 10.9 (L) 12.0 - 15.0 g/dL   HCT 33.9 (L) 36.0 - 46.0 %   MCV 70.2 (L) 80.0 - 100.0 fL   MCH 22.6 (L) 26.0 - 34.0 pg   MCHC 32.2 30.0 - 36.0 g/dL   RDW 16.6 (H) 11.5 - 15.5 %   Platelets 170 150 - 400 K/uL   nRBC 0.4 (H) 0.0 - 0.2 %   Neutrophils Relative % 89 %   Neutro Abs 21.1 (H) 1.7 - 7.7 K/uL   Lymphocytes Relative 5 %   Lymphs Abs 1.1 0.7 - 4.0 K/uL   Monocytes Relative 5 %   Monocytes Absolute 1.1 (H) 0.1 - 1.0 K/uL   Eosinophils Relative 0 %   Eosinophils Absolute 0.0 0.0 - 0.5 K/uL   Basophils Relative 0 %   Basophils Absolute 0.0 0.0 - 0.1 K/uL   Immature Granulocytes 1 %   Abs Immature Granulocytes 0.23 (H) 0.00 - 0.07 K/uL    Comment: Performed at [facility] [address], [place], [place] [address]  Basic metabolic panel     Status: Abnormal   Collection Time: 04/25/18  6:00 PM  Result Value Ref Range   Sodium 141 135 - 145 mmol/L   Potassium 4.3 3.5 - 5.1 mmol/L   Chloride 105 98 - 111 mmol/L   CO2 16 (L) 22 - 32 mmol/L   Glucose, Bld 239 (H) 70 - 99 mg/dL   BUN 11 6 - 20 mg/dL   Creatinine, Ser 1.11 (H) 0.44 - 1.00 mg/dL   Calcium 7.9 (L) 8.9 - 10.3 mg/dL   GFR calc non Af Amer 58 (L) >60 mL/min   GFR calc Af Amer >60 >60 mL/min   Anion gap 20 (H) 5 - 15    Comment: Performed at [facility], [place] [address] [place], [place] [address]  I-STAT 7, (LYTES,  BLD GAS, ICA, H+H)     Status: Abnormal   Collection Time: 04/25/18  6:02 PM  Result Value Ref Range   pH, Arterial 7.222 (L) 7.350 - 7.450   pCO2 arterial  43.4 32.0 - 48.0 mmHg   pO2, Arterial 135.0 (H) 83.0 - 108.0 mmHg   Bicarbonate 18.1 (L) 20.0 - 28.0 mmol/L   TCO2 19 (L) 22 - 32 mmol/L   O2 Saturation 99.0 %   Acid-base deficit 10.0 (H) 0.0 - 2.0 mmol/L   Sodium 141 135 - 145 mmol/L   Potassium 4.2 3.5 - 5.1 mmol/L   Calcium, Ion 1.00 (L) 1.15 - 1.40 mmol/L   HCT 34.0 (L) 36.0 - 46.0 %   Hemoglobin 11.6 (L) 12.0 - 15.0 g/dL   Patient temperature 96.5 F    Collection site RADIAL, ALLEN'S TEST ACCEPTABLE    Drawn by VP    Sample type ARTERIAL   DIC (disseminated intravasc coag) panel     Status: Abnormal   Collection Time: 04/25/18  7:11 PM  Result Value Ref Range   Prothrombin Time 17.3 (H) 11.4 - 15.2 seconds   INR 1.4 (H) 0.8 - 1.2    Comment: (NOTE) INR goal varies based on device and disease states.    aPTT 38 (H) 24 - 36 seconds    Comment:        IF BASELINE aPTT IS ELEVATED, SUGGEST PATIENT RISK ASSESSMENT BE USED TO DETERMINE APPROPRIATE ANTICOAGULANT THERAPY.    Fibrinogen 312 210 - 475 mg/dL   D-Dimer, Quant 8.28 (H) 0.00 - 0.50 ug/mL-FEU    Comment: (NOTE) At the manufacturer cut-off of 0.50 ug/mL FEU, this assay has been documented to exclude PE with a sensitivity and negative predictive value of 97 to 99%.  At this time, this assay has not been approved by the FDA to exclude DVT/VTE. Results should be correlated with clinical presentation.    Platelets 173 150 - 400 K/uL   Smear Review NO SCHISTOCYTES SEEN     Comment: Performed at [facility], [place] [address], [place], [place] [address]  Glucose, capillary     Status: Abnormal   Collection Time: 04/25/18  8:07 PM  Result Value Ref Range   Glucose-Capillary 126 (H) 70 - 99 mg/dL  Hemoglobin and hematocrit, blood     Status: Abnormal   Collection Time: 04/25/18  8:17 PM  Result Value Ref  Range   Hemoglobin 10.1 (L) 12.0 - 15.0 g/dL   HCT 32.4 (L) 36.0 - 46.0 %    Comment: Performed at [facility] [address], [place], [place] [address]  Blood gas, arterial     Status: Abnormal   Collection Time: 04/25/18  9:19 PM  Result Value Ref Range   FIO2 40.00    Delivery systems VENTILATOR    Mode PRESSURE REGULATED VOLUME CONTROL    VT 450 mL   LHR 16 resp/min   Peep/cpap 5.0 cm H20   pH, Arterial 7.233 (L) 7.350 - 7.450   pCO2 arterial 37.8 32.0 - 48.0 mmHg   pO2, Arterial 168 (H) 83.0 - 108.0 mmHg   Bicarbonate 15.3 (L) 20.0 - 28.0 mmol/L   Acid-base deficit 10.7 (H) 0.0 - 2.0 mmol/L   O2 Saturation 98.8 %   Patient temperature 98.6    Collection site A-LINE    Drawn by COLLECTED BY NURSE    Sample type ARTERIAL DRAW    Allens test (pass/fail) PASS PASS  Glucose, capillary     Status: None   Collection Time: 04/25/18 11:08 PM  Result Value Ref Range   Glucose-Capillary 70 70 - 99 mg/dL  Glucose, capillary     Status: Abnormal   Collection  Time: 04/26/18  3:09 AM  Result Value Ref Range   Glucose-Capillary 136 (H) 70 - 99 mg/dL  CBC     Status: Abnormal   Collection Time: 04/26/18  5:13 AM  Result Value Ref Range   WBC 14.8 (H) 4.0 - 10.5 K/uL   RBC 4.04 3.87 - 5.11 MIL/uL   Hemoglobin 8.9 (L) 12.0 - 15.0 g/dL    Comment: Reticulocyte Hemoglobin testing may be clinically indicated, consider ordering this additional test [id number]    HCT 27.0 (L) 36.0 - 46.0 %   MCV 66.8 (L) 80.0 - 100.0 fL   MCH 22.0 (L) 26.0 - 34.0 pg   MCHC 33.0 30.0 - 36.0 g/dL   RDW 16.3 (H) 11.5 - 15.5 %   Platelets 134 (L) 150 - 400 K/uL   nRBC 0.3 (H) 0.0 - 0.2 %    Comment: Performed at [facility], [place] [address] [place], [place] [address]  Comprehensive metabolic panel     Status: Abnormal   Collection Time: 04/26/18  5:13 AM  Result Value Ref Range   Sodium 141 135 - 145 mmol/L   Potassium 4.9 3.5 - 5.1 mmol/L   Chloride 100 98 - 111 mmol/L   CO2 19 (L) 22  - 32 mmol/L   Glucose, Bld 127 (H) 70 - 99 mg/dL   BUN 19 6 - 20 mg/dL   Creatinine, Ser 1.93 (H) 0.44 - 1.00 mg/dL   Calcium 8.1 (L) 8.9 - 10.3 mg/dL   Total Protein 5.8 (L) 6.5 - 8.1 g/dL   Albumin 4.4 3.5 - 5.0 g/dL   AST 1,717 (H) 15 - 41 U/L   ALT 1,318 (H) 0 - 44 U/L   Alkaline Phosphatase 203 (H) 38 - 126 U/L   Total Bilirubin 0.9 0.3 - 1.2 mg/dL   GFR calc non Af Amer 30 (L) >60 mL/min   GFR calc Af Amer 35 (L) >60 mL/min   Anion gap 22 (H) 5 - 15    Comment: Performed at [facility], [place] [address], [place], [place] [address]  Magnesium     Status: Abnormal   Collection Time: 04/26/18  5:13 AM  Result Value Ref Range   Magnesium 1.5 (L) 1.7 - 2.4 mg/dL    Comment: Performed at [facility] [address], [place], [place] [address]  Phosphorus     Status: Abnormal   Collection Time: 04/26/18  5:13 AM  Result Value Ref Range   Phosphorus 7.5 (H) 2.5 - 4.6 mg/dL    Comment: Performed at [facility] [address], [place], [place] [address]  Protime-INR     Status: Abnormal   Collection Time: 04/26/18  5:13 AM  Result Value Ref Range   Prothrombin Time 20.4 (H) 11.4 - 15.2 seconds   INR 1.8 (H) 0.8 - 1.2    Comment: (NOTE) INR goal varies based on device and disease states. Performed at [facility], [place] [address] [place], [place] [address]   Prepare fresh frozen plasma     Status: None   Collection Time: 04/26/18  7:13 AM  Result Value Ref Range   Unit Number [id number]    Blood Component Type THAWED PLASMA    Unit division 00    Status of Unit ISSUED,FINAL    Transfusion Status      OK TO TRANSFUSE Performed at [facility] [address] [place], [place] [address]    Unit  Number [id number]    Blood Component Type THAWED PLASMA    Unit division 00    Status of Unit ISSUED,FINAL    Transfusion Status OK TO TRANSFUSE   Glucose, capillary     Status: Abnormal   Collection Time: 04/26/18  7:34 AM  Result Value Ref  Range   Glucose-Capillary 118 (H) 70 - 99 mg/dL  I-STAT 7, (LYTES, BLD GAS, ICA, H+H)     Status: Abnormal   Collection Time: 04/26/18  8:13 AM  Result Value Ref Range   pH, Arterial 7.466 (H) 7.350 - 7.450   pCO2 arterial 34.9 32.0 - 48.0 mmHg   pO2, Arterial 108.0 83.0 - 108.0 mmHg   Bicarbonate 25.2 20.0 - 28.0 mmol/L   TCO2 26 22 - 32 mmol/L   O2 Saturation 99.0 %   Acid-Base Excess 1.0 0.0 - 2.0 mmol/L   Sodium 140 135 - 145 mmol/L   Potassium 4.8 3.5 - 5.1 mmol/L   Calcium, Ion 0.92 (L) 1.15 - 1.40 mmol/L   HCT 27.0 (L) 36.0 - 46.0 %   Hemoglobin 9.2 (L) 12.0 - 15.0 g/dL   Patient temperature 98.2 F    Collection site ARTERIAL LINE    Drawn by Operator    Sample type ARTERIAL   Glucose, capillary     Status: Abnormal   Collection Time: 04/26/18 11:50 AM  Result Value Ref Range   Glucose-Capillary 100 (H) 70 - 99 mg/dL  Hemoglobin and hematocrit, blood     Status: Abnormal   Collection Time: 04/26/18 12:57 PM  Result Value Ref Range   Hemoglobin 7.2 (L) 12.0 - 15.0 g/dL    Comment: REPEATED TO VERIFY   HCT 21.6 (L) 36.0 - 46.0 %    Comment: Performed at [facility], [address] [place], [place] [address]  Prepare RBC     Status: None   Collection Time: 04/26/18  2:28 PM  Result Value Ref Range   Order Confirmation      ORDER PROCESSED BY BLOOD BANK Performed at [facility], [place] [address] [place], [place] [address]   Glucose, capillary     Status: Abnormal   Collection Time: 04/26/18  4:11 PM  Result Value Ref Range   Glucose-Capillary 111 (H) 70 - 99 mg/dL  Basic metabolic panel     Status: Abnormal   Collection Time: 04/26/18  4:14 PM  Result Value Ref Range   Sodium 139 135 - 145 mmol/L   Potassium 4.6 3.5 - 5.1 mmol/L   Chloride 94 (L) 98 - 111 mmol/L   CO2 24 22 - 32 mmol/L   Glucose, Bld 124 (H) 70 - 99 mg/dL   BUN 38 (H) 6 - 20 mg/dL   Creatinine, Ser 3.28 (H) 0.44 - 1.00 mg/dL    Comment: REPEATED TO VERIFY   Calcium 8.3 (L) 8.9 -  10.3 mg/dL   GFR calc non Af Amer 16 (L) >60 mL/min   GFR calc Af Amer 18 (L) >60 mL/min   Anion gap 21 (H) 5 - 15    Comment: Performed at [facility], [place] [address] [place], [place] [address]  I-STAT 7, (LYTES, BLD GAS, ICA, H+H)     Status: Abnormal   Collection Time: 04/26/18  6:10 PM  Result Value Ref Range   pH, Arterial 7.533 (H) 7.350 - 7.450   pCO2 arterial 36.1 32.0 - 48.0 mmHg   pO2, Arterial 78.0 (L) 83.0 - 108.0 mmHg  Bicarbonate 30.4 (H) 20.0 - 28.0 mmol/L   TCO2 32 22 - 32 mmol/L   O2 Saturation 97.0 %   Acid-Base Excess 7.0 (H) 0.0 - 2.0 mmol/L   Sodium 140 135 - 145 mmol/L   Potassium 4.6 3.5 - 5.1 mmol/L   Calcium, Ion 0.92 (L) 1.15 - 1.40 mmol/L   HCT 29.0 (L) 36.0 - 46.0 %   Hemoglobin 9.9 (L) 12.0 - 15.0 g/dL   Patient temperature 97.6 F    Collection site ARTERIAL LINE    Drawn by Operator    Sample type ARTERIAL   Glucose, capillary     Status: None   Collection Time: 04/26/18  7:08 PM  Result Value Ref Range   Glucose-Capillary 98 70 - 99 mg/dL  Hemoglobin and hematocrit, blood     Status: Abnormal   Collection Time: 04/26/18 10:00 PM  Result Value Ref Range   Hemoglobin 11.0 (L) 12.0 - 15.0 g/dL    Comment: POST TRANSFUSION SPECIMEN   HCT 31.4 (L) 36.0 - 46.0 %    Comment: Performed at [facility], [address] [place], [place] [address]  Glucose, capillary     Status: Abnormal   Collection Time: 04/26/18 11:13 PM  Result Value Ref Range   Glucose-Capillary 113 (H) 70 - 99 mg/dL  Hemoglobin and hematocrit, blood     Status: Abnormal   Collection Time: 04/27/18 12:11 AM  Result Value Ref Range   Hemoglobin 9.3 (L) 12.0 - 15.0 g/dL    Comment: REPEATED TO VERIFY POST TRANSFUSION SPECIMEN DELTA CHECK NOTED    HCT 27.4 (L) 36.0 - 46.0 %    Comment: Performed at [facility] [address] [place], [place] [address]  Glucose, capillary     Status: Abnormal   Collection Time: 04/27/18  3:19 AM  Result Value Ref Range    Glucose-Capillary 123 (H) 70 - 99 mg/dL  CBC     Status: Abnormal   Collection Time: 04/27/18  4:44 AM  Result Value Ref Range   WBC 18.0 (H) 4.0 - 10.5 K/uL   RBC 4.00 3.87 - 5.11 MIL/uL   Hemoglobin 10.0 (L) 12.0 - 15.0 g/dL   HCT 28.7 (L) 36.0 - 46.0 %   MCV 71.8 (L) 80.0 - 100.0 fL    Comment: POST TRANSFUSION SPECIMEN REPEATED TO VERIFY DELTA CHECK NOTED    MCH 25.0 (L) 26.0 - 34.0 pg   MCHC 34.8 30.0 - 36.0 g/dL   RDW 19.2 (H) 11.5 - 15.5 %   Platelets 83 (L) 150 - 400 K/uL    Comment: REPEATED TO VERIFY DELTA CHECK NOTED PLATELET COUNT CONFIRMED BY SMEAR SPECIMEN CHECKED FOR CLOTS    nRBC 0.5 (H) 0.0 - 0.2 %    Comment: Performed at [facility], [address] [place],  [address]  Comprehensive metabolic panel     Status: Abnormal   Collection Time: 04/27/18  4:44 AM  Result Value Ref Range   Sodium 143 135 - 145 mmol/L   Potassium 3.9 3.5 - 5.1 mmol/L   Chloride 99 98 - 111 mmol/L   CO2 28 22 - 32 mmol/L   Glucose, Bld 101 (H) 70 - 99 mg/dL   BUN 49 (H) 6 - 20 mg/dL   Creatinine, Ser 3.85 (H) 0.44 - 1.00 mg/dL   Calcium 8.5 (L) 8.9 - 10.3 mg/dL   Total Protein 5.6 (L) 6.5 - 8.1 g/dL   Albumin 4.4 3.5 - 5.0  g/dL   AST 3,354 (H) 15 - 41 U/L    Comment: RESULTS CONFIRMED BY MANUAL DILUTION   ALT 1,534 (H) 0 - 44 U/L   Alkaline Phosphatase 300 (H) 38 - 126 U/L   Total Bilirubin 2.0 (H) 0.3 - 1.2 mg/dL   GFR calc non Af Amer 13 (L) >60 mL/min   GFR calc Af Amer 15 (L) >60 mL/min   Anion gap 16 (H) 5 - 15    Comment: Performed at [facility] [address], [place], [place] [address]  Magnesium     Status: None   Collection Time: 04/27/18  4:44 AM  Result Value Ref Range   Magnesium 2.3 1.7 - 2.4 mg/dL    Comment: Performed at [facility] [address], [place], [place] [address]  Phosphorus     Status: None   Collection Time: 04/27/18  4:44 AM  Result Value Ref Range   Phosphorus 4.1 2.5 - 4.6 mg/dL    Comment: Performed at  [facility] [address], [place], [place] [address]  Protime-INR     Status: Abnormal   Collection Time: 04/27/18  4:44 AM  Result Value Ref Range   Prothrombin Time 20.2 (H) 11.4 - 15.2 seconds   INR 1.8 (H) 0.8 - 1.2    Comment: (NOTE) INR goal varies based on device and disease states. Performed at [facility], [place] [address] [place], [place] [address]   Glucose, capillary     Status: Abnormal   Collection Time: 04/27/18  7:10 AM  Result Value Ref Range   Glucose-Capillary 120 (H) 70 - 99 mg/dL   Comment 1 Notify RN    Comment 2 Document in Chart   Prepare fresh frozen plasma     Status: None (Preliminary result)   Collection Time: 04/27/18  8:40 AM  Result Value Ref Range   Unit Number [id number]    Blood Component Type THAWED PLASMA    Unit division 00    Status of Unit ISSUED    Transfusion Status OK TO TRANSFUSE    Unit Number [id number]    Blood Component Type THAWED PLASMA    Unit division 00    Status of Unit REL FROM [facility]    Transfusion Status OK TO TRANSFUSE    Unit Number [id number]    Blood Component Type THAWED PLASMA    Unit division 00    Status of Unit ISSUED    Transfusion Status      OK TO TRANSFUSE Performed at [facility], [place] [address], [place], [place] [address]   Glucose, capillary     Status: Abnormal   Collection Time: 04/27/18 11:07 AM  Result Value Ref Range   Glucose-Capillary 124 (H) 70 - 99 mg/dL   Comment 1 Notify RN    Comment 2 Document in Chart     Dg Chest 1 View  Result Date: 04/25/2018 CLINICAL DATA:  Status post repositioning of endotracheal tube. EXAM: CHEST  1 VIEW COMPARISON:  Single-view of the chest earlier today. FINDINGS: Endotracheal tube is been pulled back with the tip now 1.7 cm above the carina. No other change. IMPRESSION: As above. Electronically Signed   By: Inge Rise M.D.   On: 04/25/2018 14:57   Dg Chest 1 View  Result Date: 04/25/2018 CLINICAL DATA:  Status post ET  tube placement. EXAM: CHEST  1 VIEW COMPARISON:  CT chest 11/15/2017. Single-view of the chest 11/08/2017.  FINDINGS: Endotracheal tube is in place with the tip just within the right mainstem bronchus. NG tube tip is in good position in the stomach. Right Port-A-Cath noted. Lungs are clear. Heart size is normal. No pneumothorax or pleural effusion. Remote left rib fractures noted. IMPRESSION: ET tube is in the right mainstem bronchus. NG tube in good position. Lungs clear. Electronically Signed   By: Inge Rise M.D.   On: 04/25/2018 14:57   Ct Head Wo Contrast  Result Date: 04/25/2018 CLINICAL DATA:  Encephalopathy with sluggish pupils EXAM: CT HEAD WITHOUT CONTRAST TECHNIQUE: Contiguous axial images were obtained from the base of the skull through the vertex without intravenous contrast. COMPARISON:  None. FINDINGS: Brain: The ventricles are normal in size and configuration. There is symmetric frontal atrophy bilaterally. There is no intracranial mass, hemorrhage, extra-axial fluid collection, or midline shift. Brain parenchyma appears unremarkable. No evident acute infarct. Vascular: There is no hyperdense vessel. There is no appreciable vascular calcification. Skull: Bony calvarium appears intact. Sinuses/Orbits: Visualized paranasal sinuses are clear. Visualized orbits appear symmetric bilaterally. Other: Mastoid air cells are clear. IMPRESSION: Frontal atrophy bilaterally. Ventricles normal in size and configuration. Brain parenchyma appears unremarkable. No acute infarct. No mass or hemorrhage. Electronically Signed   By: Lowella Grip III M.D.   On: 04/25/2018 19:06   Ct Angio Chest Pe W Or Wo Contrast  Addendum Date: 04/25/2018   ADDENDUM REPORT: 04/25/2018 19:49 ADDENDUM: Comment: By report, the mass has been removed from the head of the pancreas. 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 The stent is actually a pancreatic stent, no longer  extending into the biliary duct system. The air in the biliary ductal system is felt to be due to the Whipple procedure and stent placement. Electronically Signed   By: Lowella Grip III M.D.   On: 04/25/2018 19:49   Result Date: 04/25/2018 CLINICAL DATA:  Hypoxia EXAM: CT ANGIOGRAPHY CHEST WITH CONTRAST TECHNIQUE: Multidetector CT imaging of the chest was performed using the standard protocol during bolus administration of intravenous contrast. Multiplanar CT image reconstructions and MIPs were obtained to evaluate the vascular anatomy. CONTRAST:  73m OMNIPAQUE IOHEXOL 350 MG/ML SOLN COMPARISON:  Chest CT November 15, 2017; chest radiograph April 25, 2018 FINDINGS: Cardiovascular: There is no demonstrable pulmonary embolus. There is no thoracic aortic aneurysm or dissection. Visualized great vessels appear unremarkable. There is no pericardial effusion or pericardial thickening. There is left ventricular hypertrophy. Mediastinum/Nodes: Thyroid appears unremarkable. There are scattered subcentimeter mediastinal lymph nodes. There is a subcarinal lymph node measuring 1.7 x 1.4 cm. No other lymph node enlargement is demonstrable. Nasogastric tube passes through the esophagus into the stomach. There is a small hiatal hernia. Lungs/Pleura: Endotracheal tube tip is just above the carina. No pneumothorax. There is atelectatic change in the lower lung regions. There is no edema or consolidation. No appreciable pleural effusion. Upper Abdomen: There is a biliary stent. Air within the biliary ductal system is due to previous sphincterotomy. There is a mass arising in the pancreatic head region measuring 4.4 x 4.7 cm. A drainage catheter is noted in the upper abdomen. There are foci of pneumoperitoneum. There is mild upper abdominal ascites. Musculoskeletal: No blastic or lytic bone lesions are evident. There are no chest wall lesions. Review of the MIP images confirms the above findings. IMPRESSION: 1. No demonstrable  pulmonary embolus. No thoracic aortic aneurysm or dissection. 2. Areas of atelectatic change bilaterally. No consolidation. No pneumothorax. Endotracheal tube  tip is slightly superior to the carina. 3. Enlarged subcarinal lymph node, concerning for neoplastic etiology. 4. 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 Biliary stent present. Biliary duct air is felt to be secondary to the stent placement. 5. Mild upper abdominal ascites. Pneumoperitoneum with apparent drainage catheter present. The pneumoperitoneum is likely of post procedural/postoperative etiology. Bowel perforation in this circumstance cannot be excluded entirely, however. Close clinical assessment in this regard advised. 6.  Left ventricular hypertrophy. Electronically Signed: By: Lowella Grip III M.D. On: 04/25/2018 19:04   Dg Chest Port 1 View  Result Date: 04/27/2018 CLINICAL DATA:  Dyspnea, pancreatic cancer, recent Whipple EXAM: PORTABLE CHEST 1 VIEW COMPARISON:  Chest radiograph from one day prior. FINDINGS: Stable right internal jugular Port-A-Cath terminating near the cavoatrial junction. Enteric tube loops in the fundus with the tip in the proximal stomach. Surgical drains terminate over the upper abdomen bilaterally. Stable cardiomediastinal silhouette with normal heart size. No pneumothorax. No pleural effusion. Very low lung volumes. Vascular crowding without overt pulmonary edema. Mild patchy left retrocardiac opacity. IMPRESSION: Very low lung volumes. No pneumothorax. Mild patchy left retrocardiac opacity, favor mild atelectasis. Electronically Signed   By: Ilona Sorrel M.D.   On: 04/27/2018 11:36   Dg Chest Port 1 View  Result Date: 04/26/2018 CLINICAL DATA:  Status post repositioning of endotracheal tube. EXAM: PORTABLE CHEST 1 VIEW COMPARISON:  CT chest and single view of the chest 04/25/2018. FINDINGS: Endotracheal tube has been pulled back with the tip now in good position at the level of clavicular  heads. NG tube tip remains in the stomach. Lungs are clear. Heart size is normal. No pneumothorax or pleural fluid. Remote left rib fractures noted. No acute bony abnormality. IMPRESSION: Endotracheal tube is now in good position.  Lungs are clear. Electronically Signed   By: Inge Rise M.D.   On: 04/26/2018 08:37   Dg Abd Portable 1v  Result Date: 04/26/2018 CLINICAL DATA:  Status post NG tube placement. EXAM: PORTABLE ABDOMEN - 1 VIEW COMPARISON:  None. FINDINGS: NG tube is in place with both the tip and side-port in the stomach. The patient has undergone laparotomy since the prior examination. Surgical drains are in place. IMPRESSION: NG tube in good position. Electronically Signed   By: Inge Rise M.D.   On: 04/26/2018 13:27   US Liver Doppler  Result Date: 04/27/2018 CLINICAL DATA:  Elevated liver function tests EXAM: DUPLEX ULTRASOUND OF LIVER TECHNIQUE: Color and duplex Doppler ultrasound was performed to evaluate the hepatic in-flow and out-flow vessels. COMPARISON:  None. FINDINGS: Portal Vein Velocities Main:  43 cm/sec Right:  39 cm/sec Left:  44 cm/sec Hepatic Vein Velocities Right:  30 cm/sec Middle:  73 cm/sec Left:  32 cm/sec Hepatic Artery Velocity:  88 cm/sec Splenic Vein Velocity:  10.8 cm/sec Varices: Absent Ascites: Present Hepatic veins are hepatofugal in directionality of flow while the portal vein and branches are hepatopetal in directionality of flow. The splenic vein was obscured along the course of the pancreas but is patent in the hilum. The superior mesenteric vein was also obscured. A small amount of ascites is present. IMPRESSION: Portal and hepatic veins are patent with normal directionality of flow. The splenic and superior mesenteric veins were obscured. Small amount of ascites. Electronically Signed   By: Marybelle Killings M.D.   On: 04/27/2018 11:53    Assessment/Plan  1.  Acute kidney injury: nonoliguric at present.  Etiology a combination of things- EBL,  hypotension postoperatively, significant  third spacing of fluids, and the CTA in the postoperative period.  Baseline Cr is 0.6. Renal US performed and is pending at this time.  I don't suspect abd compartment syndrome given that drains are still in place and although some abd wall edema everything is fairly soft  I'll send a UA.  I don't think she needs dialysis just yet but I don't think she's reached her peak Cr yet.  I agree with the excellent supportive care she is receiving.  Avoid NSAIDs, further IV contrast (if possibe), mag citrate, fleets enemas, PPIs if possible.  Further episodes of hypotension should be avoided as well.    2.  S/p Whipple- for pancreatic cancer, 04/25/2018 with Dr. Barry Dienes.  S/p neoadjuvant chemo.  3.  Dispo: in ICU for now.  Madelon Lips 04/27/2018, 2:37 PM

## 2018-04-27 NOTE — Progress Notes (Addendum)
NAME:  Jane Brooks, MRN:  [id number], DOB:  07-14-1970, LOS: 2 ADMISSION DATE:  04/25/2018, CONSULTATION DATE:  04/25/2018 REFERRING MD:  Barry Dienes , CHIEF COMPLAINT:  Pancreatic cancer   Brief History   48 year old lady with pancreatic cancer Presented with a borderline resectable pancreatic head mass with significant invasion Has had chemotherapy Status post laparotomy  History of present illness   Postoperatively Hypotensive-on pressors Tachycardic Hemostasis was achieved during surgery  Past Medical History   Past Medical History:  Diagnosis Date  . Family history of lung cancer   . Family history of prostate cancer   . Family history of uterine cancer   . Gallstones 10/2017  . Pancreatic cancer ([place])   . Pneumothorax, closed, traumatic    years ago  . PONV (postoperative nausea and vomiting)      Significant Hospital Events   Post laparotomy 4/9   Consults:  PCCM 04/25/2018  Procedures:  Laparotomy-refer to surgical op note ETT 4/9 >> 4/10  Significant Diagnostic Tests:  CT abdomen and pelvis IMPRESSION: 1. Essentially stable exam compared to most recent CT scans of 02/04/2018 and 12/12/2017. 2. Stable mass in the head of the pancreas with duct dilatation and atrophy upstream. Mass continues to surround the GDA and contacts the portal vein but is free of the celiac trunk and SMA. Mass probably contacts the RIGHT hepatic artery at the takeoff of the GDA (image 56/5). Dominant LEFT hepatic artery originates from the LEFT gastric. 3. Mild intrahepatic and extrahepatic duct dilatation similar to prior with flexible stent in the common bile duct. 4. No new or progressive disease. 5. No peritoneal metastasis.  CT chest 4/9 >> no evidence of pulmonary embolism, scattered subcentimeter mediastinal lymph nodes, subcarinal lymphadenopathy.  No pneumothorax, bibasilar atelectasis.  Postsurgical changes in the abdomen  Head CT 4/9 >> no acute findings, chronic  frontal atrophy  Micro Data:  None  Antimicrobials:  Cefazolin 04/25/2018  Interim history/subjective:  Patient extubated herself yesterday midday, has overall tolerated with some mild tachypnea Remains in sinus tachycardia Complains of dry mouth, wants some ice chips or water Her epidural was reactivated yesterday afternoon.  She is not sure that is helping her pain very much, continues to require her narcotics, currently has morphine and fentanyl available  Objective   Blood pressure (!) 114/57, pulse (!) 151, temperature 99.4 F (37.4 C), temperature source Oral, resp. rate (!) 41, height 4' 11"  (1.499 m), weight 56.7 kg, SpO2 99 %.    Vent Mode: PRVC FiO2 (%):  [40 %] 40 % Set Rate:  [22 bmp] 22 bmp Vt Set:  [450 mL] 450 mL PEEP:  [5 cmH20] 5 cmH20 Plateau Pressure:  [18 cmH20] 18 cmH20   Intake/Output Summary (Last 24 hours) at 04/27/2018 0806 Last data filed at 04/27/2018 0700 Gross per 24 hour  Intake 4518.73 ml  Output 3225 ml  Net 1293.73 ml   Filed Weights   04/25/18 0615 04/25/18 0646  Weight: 56.7 kg 56.7 kg    Examination: General: Chronically ill-appearing woman, more comfortable today, still slightly tachypneic HENT: Oropharynx clear, dry, NG tube in place to low intermittent suction Lungs: Clear bilaterally, decreased at both bases Cardiovascular: Remains tachycardic, regular, sinus tachy on monitor, 150 Abdomen: Soft, drains in place with serosanguineous drainage, hypoactive bowel sounds Extremities: No significant edema Neuro: Awake, interacts appropriately, answers all questions, oriented, moves all extremities   Resolved Hospital Problem list   Respiratory acidosis, resolved Shock, multifactorial  Assessment & Plan:  Status post laparotomy,  Whipple procedure for pancreatic head cancer Management as per [facility], Dr Barry Dienes  Metabolic acidosis, presumed due to transient hypoperfusion Intra-Op, renal insufficiency Anion gap is improving, serum  creatinine up over the last 24 hours, 3.85.  Continues to have adequate urine output. Bicarb gtt was stopped, may want to consider restarting lower dose until renal recovery as acidosis may be impacting tachypnea / WOB Follow BMP closely  Acute respiratory failure, mechanical ventilation -CT chest without any evidence of pulmonary embolism She tolerated extubation, mild tachypnea likely due to her metabolic status and some abdominal restriction Push pulmonary hygiene, incentive spirometry, secretion management Continue to correct metabolic abnormalities Balance pain control to avoid abdominal restriction but also avoid respiratory suppression  Shock, due to SIRS, metabolic status, sedating medications.  Resolved Norepinephrine weaned to off Albumin volume replacement has been ordered, D5 NS infusing 125 cc/h.  Continue for another day and then decrease  Tachycardia, sinus tachy Most likely related to her discomfort.  Question whether the epidural can be adjusted.  She is receiving fentanyl, morphine as needed.  No evidence of active blood loss.  No evidence to suggest occult causes for tachycardia, tamponade etc. Favor following her heart rate with better pain control, stabilization of her metabolic status  Acute renal failure, transient hypoperfusion.  Slight worsening on 4/11 Continue to follow.  Continues to have adequate urine output although serum creatinine rising.  No indication at this time to involve nephrology but will consider if she does not plateau, start to show renal recovery  Acute transaminitis, due to transient hypoperfusion during surgery.  Progressive on 4/11 Follow LFTs, coags.  Has not yet plateaued Question whether we should remove Tylenol from her med list until transaminitis resolves Discussed with Dr Donne Hazel > planning for doppler US to insure portal flow post-op    Best practice:  Diet: N.p.o. Pain/Anxiety/Delirium protocol (if indicated): Fentanyl,  morphine VAP protocol (if indicated): In place DVT prophylaxis: scd GI prophylaxis: Protonix Glucose control:  Mobility: Bedrest Code Status: Full code Family Communication: No family at bedside Disposition: icu  Labs   CBC: Recent Labs  Lab 04/25/18 1539  04/25/18 1800  04/25/18 1911  04/26/18 0513  04/26/18 1257 04/26/18 1810 04/26/18 2200 04/27/18 0011 04/27/18 0444  WBC 17.6*  --  23.6*  --   --   --  14.8*  --   --   --   --   --  18.0*  NEUTROABS  --   --  21.1*  --   --   --   --   --   --   --   --   --   --   HGB 12.4   < > 10.9*   < >  --    < > 8.9*   < > 7.2* 9.9* 11.0* 9.3* 10.0*  HCT 39.0   < > 33.9*   < >  --    < > 27.0*   < > 21.6* 29.0* 31.4* 27.4* 28.7*  MCV 70.7*  --  70.2*  --   --   --  66.8*  --   --   --   --   --  71.8*  PLT 190  --  170  --  173  --  134*  --   --   --   --   --  83*   < > = values in this interval not displayed.    Basic Metabolic Panel: Recent Labs  Lab 04/25/18  1539  04/25/18 1800  04/26/18 0513 04/26/18 0813 04/26/18 1614 04/26/18 1810 04/27/18 0444  NA 140   < > 141   < > 141 140 139 140 143  K 4.6   < > 4.3   < > 4.9 4.8 4.6 4.6 3.9  CL 105  --  105  --  100  --  94*  --  99  CO2 17*  --  16*  --  19*  --  24  --  28  GLUCOSE 246*  --  239*  --  127*  --  124*  --  101*  BUN 9  --  11  --  19  --  38*  --  49*  CREATININE 1.17*  --  1.11*  --  1.93*  --  3.28*  --  3.85*  CALCIUM 8.5*  --  7.9*  --  8.1*  --  8.3*  --  8.5*  MG 1.9  --   --   --  1.5*  --   --   --  2.3  PHOS 9.4*  --   --   --  7.5*  --   --   --  4.1   < > = values in this interval not displayed.   GFR: Estimated Creatinine Clearance: 13.6 mL/min (A) (by C-G formula based on SCr of 3.85 mg/dL (H)). Recent Labs  Lab 04/25/18 1539 04/25/18 1800 04/26/18 0513 04/27/18 0444  WBC 17.6* 23.6* 14.8* 18.0*    Liver Function Tests: Recent Labs  Lab 04/25/18 1539 04/26/18 0513 04/27/18 0444  AST 200* 1,717* 3,354*  ALT 168* 1,318* 1,534*   ALKPHOS 200* 203* 300*  BILITOT 2.1* 0.9 2.0*  PROT 5.2* 5.8* 5.6*  ALBUMIN 3.5 4.4 4.4   No results for input(s): LIPASE, AMYLASE in the last 168 hours. No results for input(s): AMMONIA in the last 168 hours.  ABG    Component Value Date/Time   PHART 7.533 (H) 04/26/2018 1810   PCO2ART 36.1 04/26/2018 1810   PO2ART 78.0 (L) 04/26/2018 1810   HCO3 30.4 (H) 04/26/2018 1810   TCO2 32 04/26/2018 1810   ACIDBASEDEF 10.7 (H) 04/25/2018 2119   O2SAT 97.0 04/26/2018 1810     Coagulation Profile: Recent Labs  Lab 04/25/18 1354 04/25/18 1911 04/26/18 0513 04/27/18 0444  INR 1.3* 1.4* 1.8* 1.8*    Cardiac Enzymes: No results for input(s): CKTOTAL, CKMB, CKMBINDEX, TROPONINI in the last 168 hours.  HbA1C: No results found for: HGBA1C  CBG: Recent Labs  Lab 04/26/18 1611 04/26/18 1908 04/26/18 2313 04/27/18 0319 04/27/18 0710  GLUCAP 111* 98 113* 123* 120*     Critical care time: 32 minutes      Baltazar Apo, MD, PhD 04/27/2018, 8:06 AM [facility] [phone] or if no answer [phone]

## 2018-04-27 NOTE — Anesthesia Post-op Follow-up Note (Signed)
  Anesthesia Pain Follow-up Note  Patient: Jane Brooks  Day #: 2  Date of Follow-up: 04/27/2018 Time: 2:42 PM  Last Vitals:  Vitals:   04/27/18 1345 04/27/18 1415  BP: (!) 174/73 (!) 153/82  Pulse: (!) 138 (!) 134  Resp: (!) 35 (!) 35  Temp: 37.2 C 37.1 C  SpO2: 99% 99%    Level of Consciousness: lethargic   Pain: severe , worst in her low back. Also complains of a headache.  Side Effects:None  Catheter Site Exam:clean, dry, no drainage  Epidural / Intrathecal (From admission, onward)   Start     Dose/Rate Route Frequency Ordered Stop   04/26/18 1645  ropivacaine (PF) 2 mg/mL (0.2%) (NAROPIN) injection     5-10 mL/hr 5-10 mL/hr  Epidural Continuous 04/26/18 1632         Plan: Modify therapy to improve pain control at surgeon's request. Bolus of 3m 1.5% lidocaine with epi and 5169mof 0.3% ropivacaine given at bedside. Also, increased infusion to 69m76mr. Pt abdominal pain and BP/RR improved after bolus.  JohNolon Nations

## 2018-04-28 ENCOUNTER — Inpatient Hospital Stay (HOSPITAL_COMMUNITY): Payer: Medicaid Other

## 2018-04-28 DIAGNOSIS — R740 Nonspecific elevation of levels of transaminase and lactic acid dehydrogenase [LDH]: Secondary | ICD-10-CM

## 2018-04-28 LAB — BPAM FFP
Blood Product Expiration Date: 202004122359
Blood Product Expiration Date: 202004122359
Blood Product Expiration Date: 202004122359
ISSUE DATE / TIME: 202004071412
ISSUE DATE / TIME: 202004111142
ISSUE DATE / TIME: 202004111355
Unit Type and Rh: 8400
Unit Type and Rh: 8400
Unit Type and Rh: 8400

## 2018-04-28 LAB — GLUCOSE, CAPILLARY
Glucose-Capillary: 95 mg/dL (ref 70–99)
Glucose-Capillary: 115 mg/dL — ABNORMAL HIGH (ref 70–99)
Glucose-Capillary: 98 mg/dL (ref 70–99)
Glucose-Capillary: 109 mg/dL — ABNORMAL HIGH (ref 70–99)
Glucose-Capillary: 100 mg/dL — ABNORMAL HIGH (ref 70–99)
Glucose-Capillary: 93 mg/dL (ref 70–99)

## 2018-04-28 LAB — CBC
HCT: 25.5 % — ABNORMAL LOW (ref 36.0–46.0)
Hemoglobin: 8.3 g/dL — ABNORMAL LOW (ref 12.0–15.0)
MCH: 24.1 pg — ABNORMAL LOW (ref 26.0–34.0)
MCHC: 32.5 g/dL (ref 30.0–36.0)
MCV: 73.9 fL — ABNORMAL LOW (ref 80.0–100.0)
Platelets: 61 10*3/uL — ABNORMAL LOW (ref 150–400)
RBC: 3.45 MIL/uL — ABNORMAL LOW (ref 3.87–5.11)
RDW: 20 % — ABNORMAL HIGH (ref 11.5–15.5)
WBC: 18.6 10*3/uL — ABNORMAL HIGH (ref 4.0–10.5)
nRBC: 0.4 % — ABNORMAL HIGH (ref 0.0–0.2)

## 2018-04-28 LAB — PHOSPHORUS: Phosphorus: 4.4 mg/dL (ref 2.5–4.6)

## 2018-04-28 LAB — PREPARE FRESH FROZEN PLASMA
Unit division: 0
Unit division: 0
Unit division: 0

## 2018-04-28 LAB — POCT I-STAT 7, (LYTES, BLD GAS, ICA,H+H)
Bicarbonate: 22.9 mmol/L (ref 20.0–28.0)
Calcium, Ion: 1.09 mmol/L — ABNORMAL LOW (ref 1.15–1.40)
HCT: 25 % — ABNORMAL LOW (ref 36.0–46.0)
Hemoglobin: 8.5 g/dL — ABNORMAL LOW (ref 12.0–15.0)
O2 Saturation: 91 %
Patient temperature: 99.3
Potassium: 3.3 mmol/L — ABNORMAL LOW (ref 3.5–5.1)
Sodium: 151 mmol/L — ABNORMAL HIGH (ref 135–145)
TCO2: 24 mmol/L (ref 22–32)
pCO2 arterial: 30.9 mmHg — ABNORMAL LOW (ref 32.0–48.0)
pH, Arterial: 7.479 — ABNORMAL HIGH (ref 7.350–7.450)
pO2, Arterial: 56 mmHg — ABNORMAL LOW (ref 83.0–108.0)

## 2018-04-28 LAB — COMPREHENSIVE METABOLIC PANEL
ALT: 1112 U/L — ABNORMAL HIGH (ref 0–44)
AST: 1670 U/L — ABNORMAL HIGH (ref 15–41)
Albumin: 4 g/dL (ref 3.5–5.0)
Alkaline Phosphatase: 185 U/L — ABNORMAL HIGH (ref 38–126)
Anion gap: 13 (ref 5–15)
BUN: 53 mg/dL — ABNORMAL HIGH (ref 6–20)
CO2: 24 mmol/L (ref 22–32)
Calcium: 8.2 mg/dL — ABNORMAL LOW (ref 8.9–10.3)
Chloride: 109 mmol/L (ref 98–111)
Creatinine, Ser: 3.24 mg/dL — ABNORMAL HIGH (ref 0.44–1.00)
GFR calc Af Amer: 18 mL/min — ABNORMAL LOW (ref >60)
GFR calc non Af Amer: 16 mL/min — ABNORMAL LOW (ref >60)
Glucose, Bld: 120 mg/dL — ABNORMAL HIGH (ref 70–99)
Potassium: 3.4 mmol/L — ABNORMAL LOW (ref 3.5–5.1)
Sodium: 146 mmol/L — ABNORMAL HIGH (ref 135–145)
Total Bilirubin: 2.2 mg/dL — ABNORMAL HIGH (ref 0.3–1.2)
Total Protein: 5.5 g/dL — ABNORMAL LOW (ref 6.5–8.1)

## 2018-04-28 LAB — LACTIC ACID, PLASMA: Lactic Acid, Venous: 1.2 mmol/L (ref 0.5–1.9)

## 2018-04-28 LAB — MAGNESIUM: Magnesium: 2 mg/dL (ref 1.7–2.4)

## 2018-04-28 MED ORDER — DIPHENHYDRAMINE HCL 50 MG/ML IJ SOLN: 12.5000 mg | Freq: Four times a day (QID) | INTRAMUSCULAR | Status: DC | PRN

## 2018-04-28 MED ORDER — VANCOMYCIN HCL 10 G IV SOLR
1250.0000 mg | Freq: Once | INTRAVENOUS | Status: AC
  Administered 2018-04-28: 1250 mg via INTRAVENOUS
  Filled 2018-04-28: qty 1250

## 2018-04-28 MED ORDER — HYDROMORPHONE 1 MG/ML IV SOLN
INTRAVENOUS | Status: DC
  Administered 2018-04-28: 25 mg via INTRAVENOUS
  Administered 2018-04-29: 3.6 mg via INTRAVENOUS
  Administered 2018-04-29: 2.2 mg via INTRAVENOUS
  Administered 2018-04-29: 1.8 mg via INTRAVENOUS
  Administered 2018-04-29: 5 mg via INTRAVENOUS
  Administered 2018-04-29: 1.8 mg via INTRAVENOUS
  Administered 2018-04-29: 2.5 mg via INTRAVENOUS
  Administered 2018-04-30: 4.4 mg via INTRAVENOUS
  Administered 2018-04-30: 1 mg via INTRAVENOUS
  Administered 2018-04-30: 3.6 mg via INTRAVENOUS
  Administered 2018-04-30: 2.2 mg via INTRAVENOUS
  Filled 2018-04-28 (×2): qty 25

## 2018-04-28 MED ORDER — SODIUM CHLORIDE 0.9 % IV SOLN
2.0000 g | INTRAVENOUS | Status: DC
  Administered 2018-04-28 – 2018-04-29 (×2): 2 g via INTRAVENOUS
  Filled 2018-04-28 (×3): qty 2

## 2018-04-28 MED ORDER — SODIUM CHLORIDE 0.9% FLUSH: 9.0000 mL | INTRAVENOUS | Status: DC | PRN

## 2018-04-28 MED ORDER — FUROSEMIDE 10 MG/ML IJ SOLN
40.0000 mg | Freq: Once | INTRAMUSCULAR | Status: AC
  Administered 2018-04-28: 22:00:00 40 mg via INTRAVENOUS
  Filled 2018-04-28: qty 4

## 2018-04-28 MED ORDER — NALOXONE HCL 0.4 MG/ML IJ SOLN: 0.4000 mg | INTRAMUSCULAR | Status: DC | PRN

## 2018-04-28 MED ORDER — DIPHENHYDRAMINE HCL 12.5 MG/5ML PO ELIX: 12.5000 mg | ORAL_SOLUTION | Freq: Four times a day (QID) | ORAL | Status: DC | PRN

## 2018-04-28 MED ORDER — VANCOMYCIN HCL IN DEXTROSE 750-5 MG/150ML-% IV SOLN: 750.0000 mg | INTRAVENOUS | Status: DC

## 2018-04-28 NOTE — Progress Notes (Signed)
Patient was a stand by assist to the chair. She was able to tolerate sitting up in chair for 4 hours today.

## 2018-04-28 NOTE — Progress Notes (Signed)
[facility] Progress Note Patient Name: Jane Brooks DOB: 1970-06-17 MRN: [id number]   Date of Service  04/28/2018  HPI/Events of Note  CxR and ABG reviewed and discussed with bed side RN.  eICU Interventions  1. Rt upper lobe PNA, new. And New rt sided effusion/atelectsis with AHRF. WBC 18K, low grade temp. - Stat Blood culture x 2 - Vanc( as per Pharmacy, renal dosing), Cefpime IV - get LA. - Lasix 40 mg IV once. - o2 increased to 5 lit.  S/pwhipple POD 3.  In hospital since 8 th.   - will not give fluid boluses for ? New sepsis due to AKI/fluid overload.  - surgery team OK [place] managing. - consider CPAP if wob worsens.  - Covid screening questionnaire 8 th, RN notes seen. Not suspecting  At this time.      Intervention Category Intermediate Interventions: Respiratory distress - evaluation and management;Diagnostic test evaluation  Elmer Sow 04/28/2018, 9:14 PM

## 2018-04-28 NOTE — Progress Notes (Signed)
Attending MD paged and made aware x2 JP drain sites to right abdomen are draining around skin. Verbal order to change dressing and milk the tube to try and remove clots. Will continue to monitor.

## 2018-04-28 NOTE — Progress Notes (Signed)
3 Days Post-Op   Subjective/Chief Complaint: Wants water; thirst improving. Reports abdomen feeling better today than yesterday. Denies n/v with NG. Reports pain control is adequate. In better spirits today as well  Objective: Vital signs in last 24 hours: Temp:  [98 F (36.7 C)-99.5 F (37.5 C)] 98 F (36.7 C) (04/12 0800) Pulse Rate:  [119-144] 119 (04/12 0800) Resp:  [25-47] 36 (04/12 0800) BP: (99-174)/(58-118) 142/82 (04/12 0900) SpO2:  [90 %-100 %] 98 % (04/12 0800) Arterial Line BP: (116-191)/(56-112) 190/72 (04/12 0700) Last BM Date: 04/26/18  Intake/Output from previous day: 04/11 0701 - 04/12 0700 In: 4038.6 [P.O.:100; I.V.:2802.2; Blood:584.7; IV Piggyback:382.8] Out: 4260 [Urine:995; Emesis/NG output:750; Drains:2515] Intake/Output this shift: No intake/output data recorded.   General appearance: A&O x3; mildly uncomfortable; appears better than before Resp: decreased bases, not taking large volumes on IS Cardio: regular, tachycardic- improving GI:  Abdomen is soft, appropriately ttp in upper quadrants, mildly distended, dressing c/d/i.  drains with bloody ascites, but serosanguinous Extremities: extremities normal; no cyanosis or edema Pulses: palpable  Lab Results:  Recent Labs    04/27/18 1458 04/27/18 1612 04/28/18 0353  WBC 17.6*  --  18.6*  HGB 8.0* 8.2* 8.3*  HCT 23.7* 24.0* 25.5*  PLT 64*  --  61*   BMET Recent Labs    04/27/18 1458 04/27/18 1612 04/28/18 0353  NA 143 144 146*  K 4.0 4.0 3.4*  CL 102  --  109  CO2 24  --  24  GLUCOSE 153*  --  120*  BUN 54*  --  53*  CREATININE 4.02*  --  3.24*  CALCIUM 8.5*  --  8.2*   PT/INR Recent Labs    04/26/18 0513 04/27/18 0444  LABPROT 20.4* 20.2*  INR 1.8* 1.8*   ABG Recent Labs    04/26/18 1810 04/27/18 1612  PHART 7.533* 7.520*  HCO3 30.4* 29.4*    Studies/Results: US Renal  Result Date: 04/27/2018 CLINICAL DATA:  Elevated serum creatinine. EXAM: RENAL / URINARY TRACT  ULTRASOUND COMPLETE COMPARISON:  CT scan of April 19, 2018. FINDINGS: Right Kidney: Renal measurements: 11.2 x 5.4 x 4.5 cm = volume: 143 mL . Echogenicity within normal limits. No mass or hydronephrosis visualized. Left Kidney: Renal measurements: 12.5 x 6.4 x 6.0 cm = volume: 254 mL. Echogenicity within normal limits. No mass or hydronephrosis visualized. Bladder: Appears normal for degree of bladder distention. Minimal ascites is noted. IMPRESSION: No definite renal abnormality is noted. No hydronephrosis or renal obstruction is noted. Minimal ascites is noted. Electronically Signed   By: Marijo Conception, M.D.   On: 04/27/2018 18:01   Dg Chest Port 1 View  Result Date: 04/27/2018 CLINICAL DATA:  Dyspnea, pancreatic cancer, recent Whipple EXAM: PORTABLE CHEST 1 VIEW COMPARISON:  Chest radiograph from one day prior. FINDINGS: Stable right internal jugular Port-A-Cath terminating near the cavoatrial junction. Enteric tube loops in the fundus with the tip in the proximal stomach. Surgical drains terminate over the upper abdomen bilaterally. Stable cardiomediastinal silhouette with normal heart size. No pneumothorax. No pleural effusion. Very low lung volumes. Vascular crowding without overt pulmonary edema. Mild patchy left retrocardiac opacity. IMPRESSION: Very low lung volumes. No pneumothorax. Mild patchy left retrocardiac opacity, favor mild atelectasis. Electronically Signed   By: Ilona Sorrel M.D.   On: 04/27/2018 11:36   Dg Abd Portable 1v  Result Date: 04/26/2018 CLINICAL DATA:  Status post NG tube placement. EXAM: PORTABLE ABDOMEN - 1 VIEW COMPARISON:  None. FINDINGS: NG tube is  in place with both the tip and side-port in the stomach. The patient has undergone laparotomy since the prior examination. Surgical drains are in place. IMPRESSION: NG tube in good position. Electronically Signed   By: Inge Rise M.D.   On: 04/26/2018 13:27   US Liver Doppler  Result Date: 04/27/2018 CLINICAL DATA:   Elevated liver function tests EXAM: DUPLEX ULTRASOUND OF LIVER TECHNIQUE: Color and duplex Doppler ultrasound was performed to evaluate the hepatic in-flow and out-flow vessels. COMPARISON:  None. FINDINGS: Portal Vein Velocities Main:  43 cm/sec Right:  39 cm/sec Left:  44 cm/sec Hepatic Vein Velocities Right:  30 cm/sec Middle:  73 cm/sec Left:  32 cm/sec Hepatic Artery Velocity:  88 cm/sec Splenic Vein Velocity:  10.8 cm/sec Varices: Absent Ascites: Present Hepatic veins are hepatofugal in directionality of flow while the portal vein and branches are hepatopetal in directionality of flow. The splenic vein was obscured along the course of the pancreas but is patent in the hilum. The superior mesenteric vein was also obscured. A small amount of ascites is present. IMPRESSION: Portal and hepatic veins are patent with normal directionality of flow. The splenic and superior mesenteric veins were obscured. Small amount of ascites. Electronically Signed   By: Marybelle Killings M.D.   On: 04/27/2018 11:53    Anti-infectives: Anti-infectives (From admission, onward)   Start     Dose/Rate Route Frequency Ordered Stop   04/25/18 1530  ceFAZolin (ANCEF) IVPB 2g/100 mL premix     2 g 200 mL/hr over 30 Minutes Intravenous Every 8 hours 04/25/18 1520 04/25/18 1739   04/25/18 0600  ceFAZolin (ANCEF) IVPB 2g/100 mL premix     2 g 200 mL/hr over 30 Minutes Intravenous On call to O.R. 04/25/18 0545 04/25/18 1149      Assessment/Plan: POD 3 Whipple- [place] 1. Neuro- continue epidural and prn pain meds, scheduled robaxin as well 2. CV/Pulm- Better; maintaining now, tachycardia does not appear to be from blood loss, is somewhat better with pain control, likely stress response - improving 3. GI- continue ng tube, drains as expected, LFTs stable/down trending; Duplex shows patent portal and hepatic veins; could not visualize splenic or SMV 4. Renal- cr now down trending; nephrology following; appreciate assistance in  care; UOP improving as well. 2.5 L from drains; currently net positive however. Renal US normal 5. Endocrine- continue ssi, glucose under good control  CCM also assisting in her care - appreciate the help   Ileana Roup 04/28/2018

## 2018-04-28 NOTE — Progress Notes (Signed)
NAME:  Jane Brooks, MRN:  [id number], DOB:  30-Apr-1970, LOS: 3 ADMISSION DATE:  04/25/2018, CONSULTATION DATE:  04/25/2018 REFERRING MD:  Barry Dienes , CHIEF COMPLAINT:  Pancreatic cancer   Brief History   48 year old lady with pancreatic cancer Presented with a borderline resectable pancreatic head mass with significant invasion Has had chemotherapy Status post laparotomy  History of present illness   Postoperatively Hypotensive-on pressors Tachycardic Hemostasis was achieved during surgery  Past Medical History   Past Medical History:  Diagnosis Date  . Family history of lung cancer   . Family history of prostate cancer   . Family history of uterine cancer   . Gallstones 10/2017  . Pancreatic cancer ([place])   . Pneumothorax, closed, traumatic    years ago  . PONV (postoperative nausea and vomiting)      Significant Hospital Events   Post laparotomy 4/9   Consults:  PCCM 04/25/2018  Procedures:  Laparotomy-refer to surgical op note ETT 4/9 >> 4/10  Significant Diagnostic Tests:  CT abdomen and pelvis IMPRESSION: 1. Essentially stable exam compared to most recent CT scans of 02/04/2018 and 12/12/2017. 2. Stable mass in the head of the pancreas with duct dilatation and atrophy upstream. Mass continues to surround the GDA and contacts the portal vein but is free of the celiac trunk and SMA. Mass probably contacts the RIGHT hepatic artery at the takeoff of the GDA (image 56/5). Dominant LEFT hepatic artery originates from the LEFT gastric. 3. Mild intrahepatic and extrahepatic duct dilatation similar to prior with flexible stent in the common bile duct. 4. No new or progressive disease. 5. No peritoneal metastasis.  CT chest 4/9 >> no evidence of pulmonary embolism, scattered subcentimeter mediastinal lymph nodes, subcarinal lymphadenopathy.  No pneumothorax, bibasilar atelectasis.  Postsurgical changes in the abdomen  Head CT 4/9 >> no acute findings, chronic  frontal atrophy  Hepatic ultrasound 4/11 >> patent portal venous system, with normal flow pattern Renal ultrasound 4/11 >> no definite renal abnormality, no hydronephrosis or obstruction, minimal ascites  Micro Data:  None  Antimicrobials:  Cefazolin 04/25/2018  Interim history/subjective:  More comfortable, more awake today. Still complains of being very thirsty, NG tube to intermittent suction Serum creatinine has plateaued, improved urine output  Objective   Blood pressure (!) 147/86, pulse (!) 128, temperature 98 F (36.7 C), temperature source Oral, resp. rate (!) 32, height 4' 11"  (1.499 m), weight 56.7 kg, SpO2 97 %.        Intake/Output Summary (Last 24 hours) at 04/28/2018 0858 Last data filed at 04/28/2018 0700 Gross per 24 hour  Intake 3893.1 ml  Output 3760 ml  Net 133.1 ml   Filed Weights   04/25/18 0615 04/25/18 0646  Weight: 56.7 kg 56.7 kg    Examination: General: Chronically ill-appearing, a bit more comfortable, more comfortable respiratory pattern HENT: NG tube to low intermittent suction, oropharynx clear, somewhat dry Lungs: Clear bilaterally, decreased at both bases Cardiovascular: Regular, less tachycardic, heart rate 110's Abdomen: Soft, drains in place with serosanguineous drainage, hypoactive bowel sounds Extremities: No significant edema Neuro: Awake, alert, answers all questions, oriented, moves all extremities   Resolved Hospital Problem list   Respiratory acidosis, resolved Shock, multifactorial Acute respiratory failure, mechanical ventilation Multifactorial shock  Assessment & Plan:  Status post laparotomy, Whipple procedure for pancreatic head cancer Management as per C[facility] Drains in place  Metabolic acidosis, presumed due to transient hypoperfusion Intra-Op, renal insufficiency, improved Stabilizing off bicarbonate  Acute renal failure, transient hypoperfusion.  Serum  creatinine appears to have plateaued on 4/12 with improving  urine output.  Renal ultrasound reassuring Appreciate nephrology assistance Continue to replace volume losses from her intra-abdominal drains  Tachycardia, sinus tachy, improving Most likely related to her discomfort.   Ensure adequate pain control, replacement of volume losses  Acute transaminitis, due to transient hypoperfusion during surgery.  Improving 4/12.  Hepatic ultrasound, portal flow reassuring Continue to follow LFT, coags for resolution    Best practice:  Diet: Ice chips Pain/Anxiety/Delirium protocol (if indicated): Fentanyl, morphine VAP protocol (if indicated): In place DVT prophylaxis: scd GI prophylaxis: Protonix Glucose control:  Mobility: Bedrest Code Status: Full code Family Communication: No family at bedside Disposition: icu  Labs   CBC: Recent Labs  Lab 04/25/18 1800  04/25/18 1911  04/26/18 0513  04/27/18 0011 04/27/18 0444 04/27/18 1458 04/27/18 1612 04/28/18 0353  WBC 23.6*  --   --   --  14.8*  --   --  18.0* 17.6*  --  18.6*  NEUTROABS 21.1*  --   --   --   --   --   --   --   --   --   --   HGB 10.9*   < >  --    < > 8.9*   < > 9.3* 10.0* 8.0* 8.2* 8.3*  HCT 33.9*   < >  --    < > 27.0*   < > 27.4* 28.7* 23.7* 24.0* 25.5*  MCV 70.2*  --   --   --  66.8*  --   --  71.8* 72.5*  --  73.9*  PLT 170  --  173  --  134*  --   --  83* 64*  --  61*   < > = values in this interval not displayed.    Basic Metabolic Panel: Recent Labs  Lab 04/25/18 1539  04/26/18 0513  04/26/18 1614 04/26/18 1810 04/27/18 0444 04/27/18 1458 04/27/18 1612 04/28/18 0353  NA 140   < > 141   < > 139 140 143 143 144 146*  K 4.6   < > 4.9   < > 4.6 4.6 3.9 4.0 4.0 3.4*  CL 105   < > 100  --  94*  --  99 102  --  109  CO2 17*   < > 19*  --  24  --  28 24  --  24  GLUCOSE 246*   < > 127*  --  124*  --  101* 153*  --  120*  BUN 9   < > 19  --  38*  --  49* 54*  --  53*  CREATININE 1.17*   < > 1.93*  --  3.28*  --  3.85* 4.02*  --  3.24*  CALCIUM 8.5*   < > 8.1*   --  8.3*  --  8.5* 8.5*  --  8.2*  MG 1.9  --  1.5*  --   --   --  2.3  --   --  2.0  PHOS 9.4*  --  7.5*  --   --   --  4.1  --   --  4.4   < > = values in this interval not displayed.   GFR: Estimated Creatinine Clearance: 16.1 mL/min (A) (by C-G formula based on SCr of 3.24 mg/dL (H)). Recent Labs  Lab 04/26/18 0513 04/27/18 0444 04/27/18 1458 04/28/18 0353  WBC 14.8* 18.0* 17.6* 18.6*  Liver Function Tests: Recent Labs  Lab 04/25/18 1539 04/26/18 0513 04/27/18 0444 04/27/18 1458 04/28/18 0353  AST 200* 1,717* 3,354* 1,840* 1,670*  ALT 168* 1,318* 1,534* 1,016* 1,112*  ALKPHOS 200* 203* 300* 202* 185*  BILITOT 2.1* 0.9 2.0* 1.8* 2.2*  PROT 5.2* 5.8* 5.6* 5.6* 5.5*  ALBUMIN 3.5 4.4 4.4 4.6 4.0   No results for input(s): LIPASE, AMYLASE in the last 168 hours. No results for input(s): AMMONIA in the last 168 hours.  ABG    Component Value Date/Time   PHART 7.520 (H) 04/27/2018 1612   PCO2ART 36.0 04/27/2018 1612   PO2ART 73.0 (L) 04/27/2018 1612   HCO3 29.4 (H) 04/27/2018 1612   TCO2 31 04/27/2018 1612   ACIDBASEDEF 10.7 (H) 04/25/2018 2119   O2SAT 96.0 04/27/2018 1612     Coagulation Profile: Recent Labs  Lab 04/25/18 1354 04/25/18 1911 04/26/18 0513 04/27/18 0444  INR 1.3* 1.4* 1.8* 1.8*    Cardiac Enzymes: No results for input(s): CKTOTAL, CKMB, CKMBINDEX, TROPONINI in the last 168 hours.  HbA1C: No results found for: HGBA1C  CBG: Recent Labs  Lab 04/27/18 1515 04/27/18 1916 04/27/18 2317 04/28/18 0330 04/28/18 0720  GLUCAP 125* 125* 90 95 115*     Baltazar Apo, MD, PhD 04/28/2018, 8:58 AM [facility] [phone] or if no answer [phone]

## 2018-04-28 NOTE — Progress Notes (Signed)
[facility] Progress Note    Assessment/ Plan:   1.  Acute kidney injury: nonoliguric at present.  Etiology a combination of things- EBL, hypotension postoperatively, significant third spacing of fluids, and the CTA in the postoperative period causing CIN.  Baseline Cr is 0.6. Renal US performed 4/11 without hydro.  I don't suspect abd compartment syndrome given that drains are still in place and although some abd wall edema everything is fairly soft.  Don't think we need to do bladder pressures.  I'll send a UA (ordered yesterday and is pending).  FortunateIy with the aggressive fluid resuscitation her Cr and tachycardia are better.  CO2 24 on chemistries- don't think we need to add bicarb to fluids at this time; was also alkalemic on ABG yesterday.  I agree with the excellent supportive care she is receiving.  Avoid NSAIDs, further IV contrast (if possibe), mag citrate, fleets enemas, PPIs if possible.  Further episodes of hypotension should be avoided as well.  Her creatinine has plateaued and is now downtrending.  I do not forsee her needing dialysis.  2.  S/p Whipple- for pancreatic cancer, 04/25/2018 with Dr. Barry Dienes.  S/p neoadjuvant chemo.  3.  Hypernatremia: slight- likely a product of isotonic fluid resuscitation, monitor  4.  Dispo: in ICU for now.  Subjective:    Had a better night last night. Appears more comfortable this AM.  Cr coming down.    Objective:   BP (!) 147/86   Pulse (!) 128   Temp 98 F (36.7 C) (Oral)   Resp (!) 32   Ht 4' 11"  (1.499 m)   Wt 56.7 kg   SpO2 97%   BMI 25.25 kg/m   Intake/Output Summary (Last 24 hours) at 04/28/2018 0845 Last data filed at 04/28/2018 0700 Gross per 24 hour  Intake 3893.1 ml  Output 3760 ml  Net 133.1 ml   Weight change:   Physical Exam: GEN appears more comfortable today HEENT hoarse voice NECK bounding carotid pulse, no JVD PULM clear anteriorly no c/w/r CV tachycardic ABD + some abd wall edema, soft  overall, no BS, midline staples intact, 2 RUQ drains with serosanguinous fluid, Jtube with minimal brownish drainage, NGT with brown/green/coffee grounds.  Foley with excellent urine output EXT 1+ anasarca NEURO AAO x 3  Imaging: US Renal  Result Date: 04/27/2018 CLINICAL DATA:  Elevated serum creatinine. EXAM: RENAL / URINARY TRACT ULTRASOUND COMPLETE COMPARISON:  CT scan of April 19, 2018. FINDINGS: Right Kidney: Renal measurements: 11.2 x 5.4 x 4.5 cm = volume: 143 mL . Echogenicity within normal limits. No mass or hydronephrosis visualized. Left Kidney: Renal measurements: 12.5 x 6.4 x 6.0 cm = volume: 254 mL. Echogenicity within normal limits. No mass or hydronephrosis visualized. Bladder: Appears normal for degree of bladder distention. Minimal ascites is noted. IMPRESSION: No definite renal abnormality is noted. No hydronephrosis or renal obstruction is noted. Minimal ascites is noted. Electronically Signed   By: Marijo Conception, M.D.   On: 04/27/2018 18:01   Dg Chest Port 1 View  Result Date: 04/27/2018 CLINICAL DATA:  Dyspnea, pancreatic cancer, recent Whipple EXAM: PORTABLE CHEST 1 VIEW COMPARISON:  Chest radiograph from one day prior. FINDINGS: Stable right internal jugular Port-A-Cath terminating near the cavoatrial junction. Enteric tube loops in the fundus with the tip in the proximal stomach. Surgical drains terminate over the upper abdomen bilaterally. Stable cardiomediastinal silhouette with normal heart size. No pneumothorax. No pleural effusion. Very low lung volumes. Vascular crowding without overt  pulmonary edema. Mild patchy left retrocardiac opacity. IMPRESSION: Very low lung volumes. No pneumothorax. Mild patchy left retrocardiac opacity, favor mild atelectasis. Electronically Signed   By: Ilona Sorrel M.D.   On: 04/27/2018 11:36   Dg Abd Portable 1v  Result Date: 04/26/2018 CLINICAL DATA:  Status post NG tube placement. EXAM: PORTABLE ABDOMEN - 1 VIEW COMPARISON:  None.  FINDINGS: NG tube is in place with both the tip and side-port in the stomach. The patient has undergone laparotomy since the prior examination. Surgical drains are in place. IMPRESSION: NG tube in good position. Electronically Signed   By: Inge Rise M.D.   On: 04/26/2018 13:27   US Liver Doppler  Result Date: 04/27/2018 CLINICAL DATA:  Elevated liver function tests EXAM: DUPLEX ULTRASOUND OF LIVER TECHNIQUE: Color and duplex Doppler ultrasound was performed to evaluate the hepatic in-flow and out-flow vessels. COMPARISON:  None. FINDINGS: Portal Vein Velocities Main:  43 cm/sec Right:  39 cm/sec Left:  44 cm/sec Hepatic Vein Velocities Right:  30 cm/sec Middle:  73 cm/sec Left:  32 cm/sec Hepatic Artery Velocity:  88 cm/sec Splenic Vein Velocity:  10.8 cm/sec Varices: Absent Ascites: Present Hepatic veins are hepatofugal in directionality of flow while the portal vein and branches are hepatopetal in directionality of flow. The splenic vein was obscured along the course of the pancreas but is patent in the hilum. The superior mesenteric vein was also obscured. A small amount of ascites is present. IMPRESSION: Portal and hepatic veins are patent with normal directionality of flow. The splenic and superior mesenteric veins were obscured. Small amount of ascites. Electronically Signed   By: Marybelle Killings M.D.   On: 04/27/2018 11:53    Labs: BMET Recent Labs  Lab 04/25/18 1539  04/25/18 1800  04/26/18 0513 04/26/18 0813 04/26/18 1614 04/26/18 1810 04/27/18 0444 04/27/18 1458 04/27/18 1612 04/28/18 0353  NA 140   < > 141   < > 141 140 139 140 143 143 144 146*  K 4.6   < > 4.3   < > 4.9 4.8 4.6 4.6 3.9 4.0 4.0 3.4*  CL 105  --  105  --  100  --  94*  --  99 102  --  109  CO2 17*  --  16*  --  19*  --  24  --  28 24  --  24  GLUCOSE 246*  --  239*  --  127*  --  124*  --  101* 153*  --  120*  BUN 9  --  11  --  19  --  38*  --  49* 54*  --  53*  CREATININE 1.17*  --  1.11*  --  1.93*  --   3.28*  --  3.85* 4.02*  --  3.24*  CALCIUM 8.5*  --  7.9*  --  8.1*  --  8.3*  --  8.5* 8.5*  --  8.2*  PHOS 9.4*  --   --   --  7.5*  --   --   --  4.1  --   --  4.4   < > = values in this interval not displayed.   CBC Recent Labs  Lab 04/25/18 1800  04/26/18 0513  04/27/18 0444 04/27/18 1458 04/27/18 1612 04/28/18 0353  WBC 23.6*  --  14.8*  --  18.0* 17.6*  --  18.6*  NEUTROABS 21.1*  --   --   --   --   --   --   --  HGB 10.9*   < > 8.9*   < > 10.0* 8.0* 8.2* 8.3*  HCT 33.9*   < > 27.0*   < > 28.7* 23.7* 24.0* 25.5*  MCV 70.2*  --  66.8*  --  71.8* 72.5*  --  73.9*  PLT 170   < > 134*  --  83* 64*  --  61*   < > = values in this interval not displayed.    Medications:    . sodium chloride   Intravenous Once  . Chlorhexidine Gluconate Cloth  6 each Topical Daily  . insulin aspart  0-9 Units Subcutaneous Q4H  . lidocaine (PF)  5 mL Other Once  . mouth rinse  15 mL Mouth Rinse BID  . pantoprazole (PROTONIX) IV  40 mg Intravenous QHS  . scopolamine  1 patch Transdermal Once  . sodium chloride flush  10-40 mL Intracatheter Q12H      Madelon Lips, MD [facility] pgr [phone] 04/28/2018, 8:45 AM

## 2018-04-28 NOTE — Progress Notes (Signed)
Pharmacy Antibiotic Note  Jane Brooks is a 48 y.o. female with pancreatic cancer admitted on 04/25/2018 with pneumonia.  Pharmacy has been consulted for vancomycin dosing. Also started on cefepime per MD. SCr improved to 3.24 - no need for HD expected per Renal. Tmax/24h 100.5, WBC back up to 18.6.  Plan: Cefepime 2g IV q24h Vancomycin 1242m IV x 1; then Vancomycin 750 mg IV Q 48 hrs. Goal AUC 400-550. Expected AUC: 501 SCr used: 3.24 Monitor clinical progress, c/s, renal function F/u de-escalation plan/LOT, vancomycin levels as indicated   Height: 4' 11"  (149.9 cm) Weight: 125 lb (56.7 kg) IBW/kg (Calculated) : 43.2  Temp (24hrs), Avg:99.1 F (37.3 C), Min:98 F (36.7 C), Max:100.5 F (38.1 C)  Recent Labs  Lab 04/25/18 1800 04/26/18 0513 04/26/18 1614 04/27/18 0444 04/27/18 1458 04/28/18 0353  WBC 23.6* 14.8*  --  18.0* 17.6* 18.6*  CREATININE 1.11* 1.93* 3.28* 3.85* 4.02* 3.24*    Estimated Creatinine Clearance: 16.1 mL/min (A) (by C-G formula based on SCr of 3.24 mg/dL (H)).    Allergies  Allergen Reactions  . Latex Hives    HElicia Lamp PharmD, BCPS Please check AMION for all MGrand Havencontact numbers Clinical Pharmacist 04/28/2018 9:32 PM

## 2018-04-28 NOTE — Anesthesia Post-op Follow-up Note (Signed)
  Anesthesia Pain Follow-up Note  Patient: Jane Brooks  Day #: 3  Date of Follow-up: 04/28/2018 Time: 8:27 PM  Last Vitals:  Vitals:   04/28/18 1900 04/28/18 2000  BP: (!) 158/77 (!) 141/84  Pulse: (!) 119 (!) 119  Resp: (!) 31 (!) 42  Temp:  37.5 C  SpO2: 97% 96%    Level of Consciousness: alert  Pain: moderate , again, primarily back is location of pain.  Side Effects:None  Catheter Site Exam:clean, dry, no drainage  Epidural / Intrathecal (From admission, onward)   Start     Dose/Rate Route Frequency Ordered Stop   04/26/18 1645  ropivacaine (PF) 2 mg/mL (0.2%) (NAROPIN) injection     5-10 mL/hr 5-10 mL/hr  Epidural Continuous 04/26/18 1632         Plan: Continue current therapy of postop epidural at surgeon's request  Nolon Nations

## 2018-04-28 NOTE — Progress Notes (Signed)
[facility] Progress Note Patient Name: Jane Brooks DOB: [birth year] MRN: [id number]   Date of Service  04/28/2018  HPI/Events of Note  Increased crackles and RR as per bed side RN communication.  eICU Interventions  Camera: Discussed with RN. On nasal o2, sats > 95%. RR > 20.  SBP > 140. + fluid balance since whipple procedure. Elevated AST/ALT. CxR from 11 th low lung volumes.  - get Stat CxR and ABG.  Asp precautions Able to protect airways. Follows commands.      Intervention Category Intermediate Interventions: Respiratory distress - evaluation and management Minor Interventions: Communication with other healthcare providers and/or family  Elmer Sow 04/28/2018, 8:27 PM

## 2018-04-29 DIAGNOSIS — J189 Pneumonia, unspecified organism: Secondary | ICD-10-CM

## 2018-04-29 DIAGNOSIS — N179 Acute kidney failure, unspecified: Secondary | ICD-10-CM

## 2018-04-29 LAB — GLUCOSE, CAPILLARY
Glucose-Capillary: 104 mg/dL — ABNORMAL HIGH (ref 70–99)
Glucose-Capillary: 110 mg/dL — ABNORMAL HIGH (ref 70–99)
Glucose-Capillary: 124 mg/dL — ABNORMAL HIGH (ref 70–99)
Glucose-Capillary: 96 mg/dL (ref 70–99)
Glucose-Capillary: 152 mg/dL — ABNORMAL HIGH (ref 70–99)
Glucose-Capillary: 94 mg/dL (ref 70–99)

## 2018-04-29 LAB — COMPREHENSIVE METABOLIC PANEL
ALT: 795 U/L — ABNORMAL HIGH (ref 0–44)
AST: 466 U/L — ABNORMAL HIGH (ref 15–41)
Albumin: 3.4 g/dL — ABNORMAL LOW (ref 3.5–5.0)
Alkaline Phosphatase: 165 U/L — ABNORMAL HIGH (ref 38–126)
Anion gap: 11 (ref 5–15)
BUN: 36 mg/dL — ABNORMAL HIGH (ref 6–20)
CO2: 26 mmol/L (ref 22–32)
Calcium: 8 mg/dL — ABNORMAL LOW (ref 8.9–10.3)
Chloride: 115 mmol/L — ABNORMAL HIGH (ref 98–111)
Creatinine, Ser: 1.71 mg/dL — ABNORMAL HIGH (ref 0.44–1.00)
GFR calc Af Amer: 40 mL/min — ABNORMAL LOW (ref >60)
GFR calc non Af Amer: 35 mL/min — ABNORMAL LOW (ref >60)
Glucose, Bld: 143 mg/dL — ABNORMAL HIGH (ref 70–99)
Potassium: 3.1 mmol/L — ABNORMAL LOW (ref 3.5–5.1)
Sodium: 152 mmol/L — ABNORMAL HIGH (ref 135–145)
Total Bilirubin: 2.7 mg/dL — ABNORMAL HIGH (ref 0.3–1.2)
Total Protein: 5.7 g/dL — ABNORMAL LOW (ref 6.5–8.1)

## 2018-04-29 LAB — CBC
HCT: 26.7 % — ABNORMAL LOW (ref 36.0–46.0)
Hemoglobin: 8.6 g/dL — ABNORMAL LOW (ref 12.0–15.0)
MCH: 24.7 pg — ABNORMAL LOW (ref 26.0–34.0)
MCHC: 32.2 g/dL (ref 30.0–36.0)
MCV: 76.7 fL — ABNORMAL LOW (ref 80.0–100.0)
Platelets: 62 10*3/uL — ABNORMAL LOW (ref 150–400)
RBC: 3.48 MIL/uL — ABNORMAL LOW (ref 3.87–5.11)
RDW: 20.7 % — ABNORMAL HIGH (ref 11.5–15.5)
WBC: 18.2 10*3/uL — ABNORMAL HIGH (ref 4.0–10.5)
nRBC: 0 % (ref 0.0–0.2)

## 2018-04-29 LAB — MAGNESIUM: Magnesium: 2 mg/dL (ref 1.7–2.4)

## 2018-04-29 LAB — PHOSPHORUS: Phosphorus: 4.1 mg/dL (ref 2.5–4.6)

## 2018-04-29 LAB — LACTIC ACID, PLASMA: Lactic Acid, Venous: 1.2 mmol/L (ref 0.5–1.9)

## 2018-04-29 MED ORDER — SODIUM CHLORIDE 0.9 % IV SOLN
INTRAVENOUS | Status: DC | PRN
  Administered 2018-04-29 – 2018-05-02 (×3): 250 mL via INTRAVENOUS
  Administered 2018-05-04 – 2018-05-09 (×2): 500 mL via INTRAVENOUS

## 2018-04-29 MED ORDER — POTASSIUM CHLORIDE 10 MEQ/50ML IV SOLN
10.0000 meq | INTRAVENOUS | Status: AC
  Administered 2018-04-29 (×4): 10 meq via INTRAVENOUS
  Filled 2018-04-29 (×4): qty 50

## 2018-04-29 MED ORDER — VANCOMYCIN HCL IN DEXTROSE 1-5 GM/200ML-% IV SOLN: 1000.0000 mg | INTRAVENOUS | Status: DC

## 2018-04-29 MED ORDER — DEXTROSE-NACL 5-0.45 % IV SOLN
INTRAVENOUS | Status: DC
  Administered 2018-04-29 – 2018-04-30 (×3): via INTRAVENOUS

## 2018-04-29 MED ORDER — JEVITY 1.2 CAL PO LIQD: 1000.0000 mL | ORAL | Status: DC

## 2018-04-29 MED ORDER — OSMOLITE 1.5 CAL PO LIQD
1000.0000 mL | ORAL | Status: DC
  Administered 2018-04-29 – 2018-04-30 (×2): 1000 mL
  Filled 2018-04-29 (×2): qty 1000

## 2018-04-29 NOTE — Progress Notes (Signed)
4 Days Post-Op   Subjective/Chief Complaint: Had episode of respiratory distress, low grade temp yesterday, started empirically on ANTBx. No flatus, but no nausea.    Objective: Vital signs in last 24 hours: Temp:  [97.7 F (36.5 C)-100.5 F (38.1 C)] 97.7 F (36.5 C) (04/13 0800) Pulse Rate:  [112-130] 120 (04/13 0844) Resp:  [23-47] 29 (04/13 0844) BP: (122-158)/(68-95) 131/78 (04/13 0600) SpO2:  [96 %-100 %] 96 % (04/13 0844) Last BM Date: 04/26/18  Intake/Output from previous day: 04/12 0701 - 04/13 0700 In: 3492.9 [P.O.:300; I.V.:2421.6; IV Piggyback:587.2] Out: 8242 [Urine:3825; Emesis/NG output:900; Drains:1200] Intake/Output this shift: No intake/output data recorded.   General appearance: A&O x3;  Resp: breathing comfortably, but still shallow.  Gets a little short of breath with conversation.   Cardio: regular, tachycardic- improving GI:  Soft, minimally distended, serosang drain output Extremities: extremities normal; no cyanosis or edema Pulses: palpable  Lab Results:  Recent Labs    04/28/18 0353 04/28/18 2038 04/29/18 0523  WBC 18.6*  --  18.2*  HGB 8.3* 8.5* 8.6*  HCT 25.5* 25.0* 26.7*  PLT 61*  --  62*   BMET Recent Labs    04/28/18 0353 04/28/18 2038 04/29/18 0523  NA 146* 151* 152*  K 3.4* 3.3* 3.1*  CL 109  --  115*  CO2 24  --  26  GLUCOSE 120*  --  143*  BUN 53*  --  36*  CREATININE 3.24*  --  1.71*  CALCIUM 8.2*  --  8.0*   PT/INR Recent Labs    04/27/18 0444  LABPROT 20.2*  INR 1.8*   ABG Recent Labs    04/27/18 1612 04/28/18 2038  PHART 7.520* 7.479*  HCO3 29.4* 22.9    Studies/Results: US Renal  Result Date: 04/27/2018 CLINICAL DATA:  Elevated serum creatinine. EXAM: RENAL / URINARY TRACT ULTRASOUND COMPLETE COMPARISON:  CT scan of April 19, 2018. FINDINGS: Right Kidney: Renal measurements: 11.2 x 5.4 x 4.5 cm = volume: 143 mL . Echogenicity within normal limits. No mass or hydronephrosis visualized. Left Kidney:  Renal measurements: 12.5 x 6.4 x 6.0 cm = volume: 254 mL. Echogenicity within normal limits. No mass or hydronephrosis visualized. Bladder: Appears normal for degree of bladder distention. Minimal ascites is noted. IMPRESSION: No definite renal abnormality is noted. No hydronephrosis or renal obstruction is noted. Minimal ascites is noted. Electronically Signed   By: Marijo Conception, M.D.   On: 04/27/2018 18:01   Dg Chest Port 1 View  Result Date: 04/28/2018 CLINICAL DATA:  Abnormal respiration. EXAM: PORTABLE CHEST 1 VIEW COMPARISON:  Radiograph of April 27, 2018. FINDINGS: Stable cardiomediastinal silhouette. Nasogastric tube is seen in stomach. Old left rib fractures are noted. Right internal jugular Port-A-Cath is noted with distal tip in expected position of the SVC. Continued presence of elevated right hemidiaphragm. Mild right pleural effusion is noted. New airspace opacity is seen in right upper lobe concerning for possible pneumonia. Left lung is clear. IMPRESSION: New airspace opacity seen in right upper lobe concerning for pneumonia. Mild right pleural effusion is noted which is enlarged compared to prior exam. Stable elevated right hemidiaphragm is noted. Electronically Signed   By: Marijo Conception, M.D.   On: 04/28/2018 20:50   Dg Chest Port 1 View  Result Date: 04/27/2018 CLINICAL DATA:  Dyspnea, pancreatic cancer, recent Whipple EXAM: PORTABLE CHEST 1 VIEW COMPARISON:  Chest radiograph from one day prior. FINDINGS: Stable right internal jugular Port-A-Cath terminating near the cavoatrial junction. Enteric tube  loops in the fundus with the tip in the proximal stomach. Surgical drains terminate over the upper abdomen bilaterally. Stable cardiomediastinal silhouette with normal heart size. No pneumothorax. No pleural effusion. Very low lung volumes. Vascular crowding without overt pulmonary edema. Mild patchy left retrocardiac opacity. IMPRESSION: Very low lung volumes. No pneumothorax. Mild  patchy left retrocardiac opacity, favor mild atelectasis. Electronically Signed   By: Ilona Sorrel M.D.   On: 04/27/2018 11:36   US Liver Doppler  Result Date: 04/27/2018 CLINICAL DATA:  Elevated liver function tests EXAM: DUPLEX ULTRASOUND OF LIVER TECHNIQUE: Color and duplex Doppler ultrasound was performed to evaluate the hepatic in-flow and out-flow vessels. COMPARISON:  None. FINDINGS: Portal Vein Velocities Main:  43 cm/sec Right:  39 cm/sec Left:  44 cm/sec Hepatic Vein Velocities Right:  30 cm/sec Middle:  73 cm/sec Left:  32 cm/sec Hepatic Artery Velocity:  88 cm/sec Splenic Vein Velocity:  10.8 cm/sec Varices: Absent Ascites: Present Hepatic veins are hepatofugal in directionality of flow while the portal vein and branches are hepatopetal in directionality of flow. The splenic vein was obscured along the course of the pancreas but is patent in the hilum. The superior mesenteric vein was also obscured. A small amount of ascites is present. IMPRESSION: Portal and hepatic veins are patent with normal directionality of flow. The splenic and superior mesenteric veins were obscured. Small amount of ascites. Electronically Signed   By: Marybelle Killings M.D.   On: 04/27/2018 11:53    Anti-infectives: Anti-infectives (From admission, onward)   Start     Dose/Rate Route Frequency Ordered Stop   04/30/18 2230  vancomycin (VANCOCIN) IVPB 750 mg/150 ml premix     750 mg 150 mL/hr over 60 Minutes Intravenous Every 48 hours 04/28/18 2133     04/28/18 2130  ceFEPIme (MAXIPIME) 2 g in sodium chloride 0.9 % 100 mL IVPB     2 g 200 mL/hr over 30 Minutes Intravenous Every 24 hours 04/28/18 2113     04/28/18 2130  vancomycin (VANCOCIN) 1,250 mg in sodium chloride 0.9 % 250 mL IVPB     1,250 mg 166.7 mL/hr over 90 Minutes Intravenous  Once 04/28/18 2129 04/29/18 0107   04/25/18 1530  ceFAZolin (ANCEF) IVPB 2g/100 mL premix     2 g 200 mL/hr over 30 Minutes Intravenous Every 8 hours 04/25/18 1520 04/25/18 1739    04/25/18 0600  ceFAZolin (ANCEF) IVPB 2g/100 mL premix     2 g 200 mL/hr over 30 Minutes Intravenous On call to O.R. 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  Path pending.     Neuro- continue epidural and prn pain meds, scheduled robaxin as well.  PCA dilaudid.     CV/Pulm-improving.  Empiric antibiotics started yesterday for possible PNA, repeat CXR scheduled today.    GI- d/c NGT. drains as expected, LFTs stable/down trending; Duplex shows patent portal and hepatic veins; could not visualize splenic or SMV. Nutrition consult today.  Start trophic feeds.    Renal- cr now down trending; nephrology following; appreciate assistance in care; UOP improving as well. Drain output down.; currently net positive however. Renal US normal. Replete potassium. - hypokalemia.  Hypernatremia, CCM changed fluids.    Endocrine- continue ssi, glucose under good control  CCM also assisting in her care    Jane Brooks 04/29/2018

## 2018-04-29 NOTE — Progress Notes (Signed)
Nutrition Follow-up  RD working remotely.  DOCUMENTATION CODES:   Not applicable  INTERVENTION:   Initiate Osmolite 1.5 @ 10 ml/hr via j-tube.  Tube feeding regimen provides 360 kcal (23% of needs), 15 grams of protein, and 183 ml of H2O.   Recommend:   Add 30 ml Prostat BID  Increase Osmolite 1.5 as tolerated to goal rate of 41 ml/hr  Tube feeding regimen provides 1676 kcal (100% of needs), 91 grams of protein, and 749 ml of H2O.   NUTRITION DIAGNOSIS:   Increased nutrient needs related to cancer and cancer related treatments as evidenced by estimated needs.  Ongoing  GOAL:   Patient will meet greater than or equal to 90% of their needs  Progressing  MONITOR:   PO intake, Diet advancement, Labs, Weight trends, TF tolerance, Skin, I & O's  REASON FOR ASSESSMENT:   Consult Enteral/tube feeding initiation and management  ASSESSMENT:   48 year old female who presented on 4/09 for diagnostic laparoscopy, pancreaticoduodenectomy, portal vein reconstruction, placement of pancreatic stent, and placement of J-tube. PMH of adenoma of the pancreatic head and had been receiving chemotherapy.  4/9 s/p whipple 4/10 pt self extubated, NG tube replaced, j-tube placed 4/11 rectal tube removed 4/13 NGT d/c, advanced to clear liquids  Reviewed I/O's: -2.4 L x 24 hours and +511 ml x 24 hours  Drain output: 1.2 L x 24 hours  UOP: 3.8 L x 24 hours  Per general surgery notes, plan to start trickle feeds today- start at 10 ml/hr today and no advancement yet per request of MD. Pt with no flatus of nausea.   Medications reviewed and include: D5- NaCl 0.45 @ 125 ml/hr; 500 ml methocarbamol in D5  Labs reviewed: Na: 152, K: 3.1 (on IV supplementation), Mg and Phos WDL. CBGS: 93-110 (inpatient orders for glycemic control are 0-9 units insulin aspart every 4 hours).   Diet Order:   Diet Order            Diet clear liquid Room service appropriate? Yes; Fluid consistency: Thin   Diet effective now              EDUCATION NEEDS:   Not appropriate for education at this time  Skin:  Skin Assessment: Skin Integrity Issues: Skin Integrity Issues:: Incisions Incisions: closed abdomen  Last BM:  04/26/18  Height:   Ht Readings from Last 1 Encounters:  04/25/18 4' 11"  (1.499 m)    Weight:   Wt Readings from Last 1 Encounters:  04/25/18 56.7 kg    Ideal Body Weight:  43.2 kg  BMI:  Body mass index is 25.25 kg/m.  Estimated Nutritional Needs:   Kcal:  1600-1900  Protein:  70-90 grams  Fluid:  > 1.7 L/day    Elasia Furnish A. Jimmye Norman, RD, LDN, [place] Registered Dietitian II Certified Diabetes Care and Education Specialist Pager: [phone] After hours Pager: [phone]

## 2018-04-29 NOTE — Anesthesia Post-op Follow-up Note (Signed)
  Anesthesia Pain Follow-up Note  Patient: [name]  Day #: 4  Date of Follow-up: 04/29/2018 Time: 3:43 PM  Last Vitals:  Vitals:   04/29/18 1100 04/29/18 1200  BP:    Pulse: (!) 120   Resp: (!) 31   Temp:  37.8 C  SpO2: 97%     Level of Consciousness: sleepy but arousable, tachypnea with tachycardia  Pain: mild   Side Effects:None  Catheter Site Exam:clean, dry  Epidural / Intrathecal (From admission, onward)   Start     Dose/Rate Route Frequency Ordered Stop   04/26/18 1645  ropivacaine (PF) 2 mg/mL (0.2%) (NAROPIN) injection     5-10 mL/hr 5-10 mL/hr  Epidural Continuous 04/26/18 1632         Plan: Continue current therapy of postop epidural at surgeon's request. Bolused 5m off pump and will reevaluate for improved pain control.   Roda Lauture

## 2018-04-29 NOTE — Progress Notes (Signed)
eLink Physician-Brief Progress Note Patient Name: Brande Uncapher DOB: [birth year] MRN: [id number]   Date of Service  04/29/2018  HPI/Events of Note  eMD hand off to do rounds. Camera: Calm, on nasal o2. HR 104.  MAP good. Appear comfortable.  eICU Interventions  Continue care     Intervention Category Intermediate Interventions: Respiratory distress - evaluation and management  Elmer Sow 04/29/2018, 8:05 PM

## 2018-04-29 NOTE — Progress Notes (Signed)
Patient ID: Jane Brooks, female   DOB: August 12, 1970, 48 y.o.   MRN: [id number] [facility] Progress Note   Assessment/ Plan:   1. Acute kidney Injury: Nonoliguric with exuberant urine output overnight and improving renal function.  With hypokalemia and hypernatremia secondary to potassium wasting/impaired tubular function/water handling.  No indications for dialysis. 2.  Pancreatic cancer: Status post Whipple procedure on 04/25/2018 with neoadjuvant chemotherapy. 3.  Hypokalemia: Secondary to increased urinary losses/limited intake.  Will replace via intravenous route (ordered earlier). 4.  Hypernatremia: Excellent urine output with furosemide overnight.  Switched to D5 half-normal this morning.  Subjective:   Reports to be feeling fair this morning with some postoperative abdominal discomfort   Objective:   BP 131/78   Pulse (!) 120   Temp 97.7 F (36.5 C) (Oral)   Resp (!) 29   Ht 4' 11"  (1.499 m)   Wt 56.7 kg   SpO2 96%   BMI 25.25 kg/m   Intake/Output Summary (Last 24 hours) at 04/29/2018 1093 Last data filed at 04/29/2018 0600 Gross per 24 hour  Intake 3043.73 ml  Output 4825 ml  Net -1781.27 ml   Weight change:   Physical Exam: Gen: Comfortably resting in bed, oxygen via nasal cannula, NGT in situ CVS: Pulse regular tachycardia, S1 and S2 with ejection systolic murmur Resp: Poor inspiratory effort with decreased breath sounds over bases Abd: Soft, serosanguineous drain output Ext: No lower extremity edema  Imaging: US Renal  Result Date: 04/27/2018 CLINICAL DATA:  Elevated serum creatinine. EXAM: RENAL / URINARY TRACT ULTRASOUND COMPLETE COMPARISON:  CT scan of April 19, 2018. FINDINGS: Right Kidney: Renal measurements: 11.2 x 5.4 x 4.5 cm = volume: 143 mL . Echogenicity within normal limits. No mass or hydronephrosis visualized. Left Kidney: Renal measurements: 12.5 x 6.4 x 6.0 cm = volume: 254 mL. Echogenicity within normal limits. No mass or  hydronephrosis visualized. Bladder: Appears normal for degree of bladder distention. Minimal ascites is noted. IMPRESSION: No definite renal abnormality is noted. No hydronephrosis or renal obstruction is noted. Minimal ascites is noted. Electronically Signed   By: Marijo Conception, M.D.   On: 04/27/2018 18:01   Dg Chest Port 1 View  Result Date: 04/28/2018 CLINICAL DATA:  Abnormal respiration. EXAM: PORTABLE CHEST 1 VIEW COMPARISON:  Radiograph of April 27, 2018. FINDINGS: Stable cardiomediastinal silhouette. Nasogastric tube is seen in stomach. Old left rib fractures are noted. Right internal jugular Port-A-Cath is noted with distal tip in expected position of the SVC. Continued presence of elevated right hemidiaphragm. Mild right pleural effusion is noted. New airspace opacity is seen in right upper lobe concerning for possible pneumonia. Left lung is clear. IMPRESSION: New airspace opacity seen in right upper lobe concerning for pneumonia. Mild right pleural effusion is noted which is enlarged compared to prior exam. Stable elevated right hemidiaphragm is noted. Electronically Signed   By: Marijo Conception, M.D.   On: 04/28/2018 20:50   Dg Chest Port 1 View  Result Date: 04/27/2018 CLINICAL DATA:  Dyspnea, pancreatic cancer, recent Whipple EXAM: PORTABLE CHEST 1 VIEW COMPARISON:  Chest radiograph from one day prior. FINDINGS: Stable right internal jugular Port-A-Cath terminating near the cavoatrial junction. Enteric tube loops in the fundus with the tip in the proximal stomach. Surgical drains terminate over the upper abdomen bilaterally. Stable cardiomediastinal silhouette with normal heart size. No pneumothorax. No pleural effusion. Very low lung volumes. Vascular crowding without overt pulmonary edema. Mild patchy left retrocardiac opacity. IMPRESSION: Very low lung  volumes. No pneumothorax. Mild patchy left retrocardiac opacity, favor mild atelectasis. Electronically Signed   By: Ilona Sorrel M.D.    On: 04/27/2018 11:36   US Liver Doppler  Result Date: 04/27/2018 CLINICAL DATA:  Elevated liver function tests EXAM: DUPLEX ULTRASOUND OF LIVER TECHNIQUE: Color and duplex Doppler ultrasound was performed to evaluate the hepatic in-flow and out-flow vessels. COMPARISON:  None. FINDINGS: Portal Vein Velocities Main:  43 cm/sec Right:  39 cm/sec Left:  44 cm/sec Hepatic Vein Velocities Right:  30 cm/sec Middle:  73 cm/sec Left:  32 cm/sec Hepatic Artery Velocity:  88 cm/sec Splenic Vein Velocity:  10.8 cm/sec Varices: Absent Ascites: Present Hepatic veins are hepatofugal in directionality of flow while the portal vein and branches are hepatopetal in directionality of flow. The splenic vein was obscured along the course of the pancreas but is patent in the hilum. The superior mesenteric vein was also obscured. A small amount of ascites is present. IMPRESSION: Portal and hepatic veins are patent with normal directionality of flow. The splenic and superior mesenteric veins were obscured. Small amount of ascites. Electronically Signed   By: Marybelle Killings M.D.   On: 04/27/2018 11:53    Labs: BMET Recent Labs  Lab 04/25/18 1539  04/25/18 1800  04/26/18 0513  04/26/18 1614 04/26/18 1810 04/27/18 0444 04/27/18 1458 04/27/18 1612 04/28/18 0353 04/28/18 2038 04/29/18 0523  NA 140   < > 141   < > 141   < > 139 140 143 143 144 146* 151* 152*  K 4.6   < > 4.3   < > 4.9   < > 4.6 4.6 3.9 4.0 4.0 3.4* 3.3* 3.1*  CL 105  --  105  --  100  --  94*  --  99 102  --  109  --  115*  CO2 17*  --  16*  --  19*  --  24  --  28 24  --  24  --  26  GLUCOSE 246*  --  239*  --  127*  --  124*  --  101* 153*  --  120*  --  143*  BUN 9  --  11  --  19  --  38*  --  49* 54*  --  53*  --  36*  CREATININE 1.17*  --  1.11*  --  1.93*  --  3.28*  --  3.85* 4.02*  --  3.24*  --  1.71*  CALCIUM 8.5*  --  7.9*  --  8.1*  --  8.3*  --  8.5* 8.5*  --  8.2*  --  8.0*  PHOS 9.4*  --   --   --  7.5*  --   --   --  4.1  --   --  4.4   --  4.1   < > = values in this interval not displayed.   CBC Recent Labs  Lab 04/25/18 1800  04/27/18 0444 04/27/18 1458 04/27/18 1612 04/28/18 0353 04/28/18 2038 04/29/18 0523  WBC 23.6*   < > 18.0* 17.6*  --  18.6*  --  18.2*  NEUTROABS 21.1*  --   --   --   --   --   --   --   HGB 10.9*   < > 10.0* 8.0* 8.2* 8.3* 8.5* 8.6*  HCT 33.9*   < > 28.7* 23.7* 24.0* 25.5* 25.0* 26.7*  MCV 70.2*   < > 71.8* 72.5*  --  73.9*  --  76.7*  PLT 170   < > 83* 64*  --  61*  --  62*   < > = values in this interval not displayed.   Medications:    . Chlorhexidine Gluconate Cloth  6 each Topical Daily  . HYDROmorphone   Intravenous Q4H  . insulin aspart  0-9 Units Subcutaneous Q4H  . mouth rinse  15 mL Mouth Rinse BID  . pantoprazole (PROTONIX) IV  40 mg Intravenous QHS  . sodium chloride flush  10-40 mL Intracatheter Q12H   Elmarie Shiley, MD 04/29/2018, 9:28 AM

## 2018-04-29 NOTE — Progress Notes (Signed)
Pharmacy Antibiotic Note  Jane Brooks is a 48 y.o. female with pancreatic cancer admitted on 04/25/2018 with pneumonia.  Pharmacy has been consulted for vancomycin dosing. Also started on cefepime per MD. SCr continues to improve.  Down to 1.71 today.  Tmax/24h 100.5 (4/12), WBC up at 18.2.  Plan: Continue Cefepime 2g IV q24h Change Vancomycin 1000 mg IV Q 48 hrs. Goal AUC 400-550. Expected AUC: 454 SCr used: 1.71 Monitor clinical progress, c/s, renal function F/u de-escalation plan/LOT, vancomycin levels as indicated   Height: 4' 11"  (149.9 cm) Weight: 125 lb (56.7 kg) IBW/kg (Calculated) : 43.2  Temp (24hrs), Avg:98.9 F (37.2 C), Min:97.7 F (36.5 C), Max:100.5 F (38.1 C)  Recent Labs  Lab 04/26/18 0513 04/26/18 1614 04/27/18 0444 04/27/18 1458 04/28/18 0353 04/28/18 2129 04/29/18 0030 04/29/18 0523  WBC 14.8*  --  18.0* 17.6* 18.6*  --   --  18.2*  CREATININE 1.93* 3.28* 3.85* 4.02* 3.24*  --   --  1.71*  LATICACIDVEN  --   --   --   --   --  1.2 1.2  --     Estimated Creatinine Clearance: 30.5 mL/min (A) (by C-G formula based on SCr of 1.71 mg/dL (H)).    Allergies  Allergen Reactions  . Latex Hives    Laquesha Holcomb, Pharm.D., BCPS Clinical Pharmacist Clinical phone for 04/29/2018 from 8:30-4:00 is [phone].  **Pharmacist phone directory can now be found on [email or web address] ([email or web address]).  Listed under [place].  04/29/2018 11:20 AM

## 2018-04-29 NOTE — Progress Notes (Addendum)
NAME:  Jane Brooks, MRN:  [id number], DOB:  1970/12/11, LOS: 4 ADMISSION DATE:  04/25/2018, CONSULTATION DATE:  04/25/2018 REFERRING MD:  Barry Dienes , CHIEF COMPLAINT:  Pancreatic cancer   Brief History   48 year old lady with pancreatic cancer Presented with a borderline resectable pancreatic head mass with significant invasion Has had chemotherapy Status post laparotomy 4/9 PCCM consulted for post op hypotension, developed AKI, shock liver Extubated 4/10  4/12 developed resp distress, CXR with new opacity RUL    Significant Hospital Events   Post laparotomy 4/9   Consults:  PCCM 04/25/2018  Procedures:  Laparotomy-refer to surgical op note ETT 4/9 >> 4/10  Significant Diagnostic Tests:  CT abdomen and pelvis IMPRESSION: 1. Essentially stable exam compared to most recent CT scans of 02/04/2018 and 12/12/2017. 2. Stable mass in the head of the pancreas with duct dilatation and atrophy upstream. Mass continues to surround the GDA and contacts the portal vein but is free of the celiac trunk and SMA. Mass probably contacts the RIGHT hepatic artery at the takeoff of the GDA (image 56/5). Dominant LEFT hepatic artery originates from the LEFT gastric. 3. Mild intrahepatic and extrahepatic duct dilatation similar to prior with flexible stent in the common bile duct. 4. No new or progressive disease. 5. No peritoneal metastasis.  CT chest 4/9 >> no evidence of pulmonary embolism, scattered subcentimeter mediastinal lymph nodes, subcarinal lymphadenopathy.  No pneumothorax, bibasilar atelectasis.  Postsurgical changes in the abdomen  Head CT 4/9 >> no acute findings, chronic frontal atrophy  Hepatic ultrasound 4/11 >> patent portal venous system, with normal flow pattern Renal ultrasound 4/11 >> no definite renal abnormality, no hydronephrosis or obstruction, minimal ascites  Micro Data:  [facility] 4/12 >>  Antimicrobials:  Cefazolin 04/25/2018 Cefepime 4/12  (HCAP )>> vanc 4/12 >>   Interim history/subjective:  Low gr febrile Denies CP Breathing 'better' than last night  Objective   Blood pressure 131/78, pulse (!) 112, temperature 98.8 F (37.1 C), temperature source Oral, resp. rate (!) 28, height 4' 11"  (1.499 m), weight 56.7 kg, SpO2 100 %.        Intake/Output Summary (Last 24 hours) at 04/29/2018 0816 Last data filed at 04/29/2018 0600 Gross per 24 hour  Intake 3159.89 ml  Output 4825 ml  Net -1665.11 ml   Filed Weights   04/25/18 0615 04/25/18 0646  Weight: 56.7 kg 56.7 kg    Examination: General: Chronically ill-appearing, dry mouth, can speak in full sentences, anxious affect HENT: NG tube to low intermittent suction, oropharynx clear, somewhat dry Lungs: Clear bilaterally, decreased at both bases Cardiovascular: Regular,tachy Abdomen: Soft, drains in place with serosanguineous drainage, hypoactive bowel sounds Extremities: No significant edema Neuro: Awake, alert, answers all questions, oriented, moves all extremities   Resolved Hospital Problem list   Respiratory acidosis, resolved Shock, multifactorial Acute respiratory failure, mechanical ventilation Multifactorial shock Metabolic acidosis,off bicarbonate  Assessment & Plan:  Acute respiratory distress 4/12 -new RUL opacity ? HCAP vs fluid Diuresed well & feeling better Empiric cefepime /vanc  - simplify based on cx/ course Rpt CXR in am  Status post laparotomy, Whipple procedure for pancreatic head cancer Management as per CCS Drains in place   AKI, transient hypoperfusion. - improving. Renal ultrasound reassuring Replace hypokalemia Continue to replace volume losses from intra-abdominal drains  Hypernatremia - change IVfs to D51/2 NS  Acute transaminitis - shock liver,  Improving 4/12.  Hepatic ultrasound, portal flow reassuring Continue to follow LFT, coags for resolution   PCCM to follow  peripherally if she continues to improve  Kara Mead MD. FCCP. [place]  Pulmonary & Critical care Pager [phone] If no response call [phone]     04/29/2018, 8:16 AM

## 2018-04-30 ENCOUNTER — Inpatient Hospital Stay (HOSPITAL_COMMUNITY): Payer: Medicaid Other

## 2018-04-30 DIAGNOSIS — I10 Essential (primary) hypertension: Secondary | ICD-10-CM | POA: Diagnosis not present

## 2018-04-30 DIAGNOSIS — J9601 Acute respiratory failure with hypoxia: Secondary | ICD-10-CM | POA: Diagnosis not present

## 2018-04-30 DIAGNOSIS — E873 Alkalosis: Secondary | ICD-10-CM | POA: Diagnosis not present

## 2018-04-30 DIAGNOSIS — J189 Pneumonia, unspecified organism: Secondary | ICD-10-CM | POA: Diagnosis not present

## 2018-04-30 DIAGNOSIS — R0682 Tachypnea, not elsewhere classified: Secondary | ICD-10-CM | POA: Diagnosis not present

## 2018-04-30 DIAGNOSIS — R000 Tachycardia, unspecified: Secondary | ICD-10-CM | POA: Diagnosis not present

## 2018-04-30 DIAGNOSIS — Z79891 Long term (current) use of opiate analgesic: Secondary | ICD-10-CM

## 2018-04-30 DIAGNOSIS — G8918 Other acute postprocedural pain: Secondary | ICD-10-CM | POA: Diagnosis present

## 2018-04-30 LAB — CBC
HCT: 26.6 % — ABNORMAL LOW (ref 36.0–46.0)
Hemoglobin: 8.3 g/dL — ABNORMAL LOW (ref 12.0–15.0)
MCH: 23.9 pg — ABNORMAL LOW (ref 26.0–34.0)
MCHC: 31.2 g/dL (ref 30.0–36.0)
MCV: 76.7 fL — ABNORMAL LOW (ref 80.0–100.0)
Platelets: 74 10*3/uL — ABNORMAL LOW (ref 150–400)
RBC: 3.47 MIL/uL — ABNORMAL LOW (ref 3.87–5.11)
RDW: 20.6 % — ABNORMAL HIGH (ref 11.5–15.5)
WBC: 19.5 10*3/uL — ABNORMAL HIGH (ref 4.0–10.5)
nRBC: 0.2 % (ref 0.0–0.2)

## 2018-04-30 LAB — POCT I-STAT 7, (LYTES, BLD GAS, ICA,H+H)
Bicarbonate: 22.4 mmol/L (ref 20.0–28.0)
Calcium, Ion: 1.1 mmol/L — ABNORMAL LOW (ref 1.15–1.40)
HCT: 26 % — ABNORMAL LOW (ref 36.0–46.0)
Hemoglobin: 8.8 g/dL — ABNORMAL LOW (ref 12.0–15.0)
O2 Saturation: 94 %
Patient temperature: 98.6
Potassium: 2.7 mmol/L — CL (ref 3.5–5.1)
Sodium: 143 mmol/L (ref 135–145)
TCO2: 23 mmol/L (ref 22–32)
pCO2 arterial: 28.3 mmHg — ABNORMAL LOW (ref 32.0–48.0)
pH, Arterial: 7.507 — ABNORMAL HIGH (ref 7.350–7.450)
pO2, Arterial: 63 mmHg — ABNORMAL LOW (ref 83.0–108.0)

## 2018-04-30 LAB — COMPREHENSIVE METABOLIC PANEL
ALT: 464 U/L — ABNORMAL HIGH (ref 0–44)
AST: 137 U/L — ABNORMAL HIGH (ref 15–41)
Albumin: 2.9 g/dL — ABNORMAL LOW (ref 3.5–5.0)
Alkaline Phosphatase: 128 U/L — ABNORMAL HIGH (ref 38–126)
Anion gap: 11 (ref 5–15)
BUN: 20 mg/dL (ref 6–20)
CO2: 24 mmol/L (ref 22–32)
Calcium: 7.9 mg/dL — ABNORMAL LOW (ref 8.9–10.3)
Chloride: 112 mmol/L — ABNORMAL HIGH (ref 98–111)
Creatinine, Ser: 1.01 mg/dL — ABNORMAL HIGH (ref 0.44–1.00)
GFR calc Af Amer: >60 mL/min (ref >60)
GFR calc non Af Amer: >60 mL/min (ref >60)
Glucose, Bld: 119 mg/dL — ABNORMAL HIGH (ref 70–99)
Potassium: 3.1 mmol/L — ABNORMAL LOW (ref 3.5–5.1)
Sodium: 147 mmol/L — ABNORMAL HIGH (ref 135–145)
Total Bilirubin: 2.6 mg/dL — ABNORMAL HIGH (ref 0.3–1.2)
Total Protein: 5.5 g/dL — ABNORMAL LOW (ref 6.5–8.1)

## 2018-04-30 LAB — GLUCOSE, CAPILLARY
Glucose-Capillary: 137 mg/dL — ABNORMAL HIGH (ref 70–99)
Glucose-Capillary: 129 mg/dL — ABNORMAL HIGH (ref 70–99)
Glucose-Capillary: 160 mg/dL — ABNORMAL HIGH (ref 70–99)
Glucose-Capillary: 107 mg/dL — ABNORMAL HIGH (ref 70–99)
Glucose-Capillary: 156 mg/dL — ABNORMAL HIGH (ref 70–99)
Glucose-Capillary: 103 mg/dL — ABNORMAL HIGH (ref 70–99)

## 2018-04-30 LAB — PHOSPHORUS: Phosphorus: 2.2 mg/dL — ABNORMAL LOW (ref 2.5–4.6)

## 2018-04-30 LAB — MAGNESIUM: Magnesium: 1.7 mg/dL (ref 1.7–2.4)

## 2018-04-30 MED ORDER — ROPIVACAINE HCL 2 MG/ML IJ SOLN
5.0000 mL/h | INTRAMUSCULAR | Status: DC
  Administered 2018-04-30 – 2018-05-01 (×2): 8 mL/h via EPIDURAL
  Filled 2018-04-30 (×2): qty 200

## 2018-04-30 MED ORDER — DIPHENHYDRAMINE HCL 50 MG/ML IJ SOLN: 12.5000 mg | Freq: Four times a day (QID) | INTRAMUSCULAR | Status: DC | PRN

## 2018-04-30 MED ORDER — MORPHINE SULFATE (PF) 2 MG/ML IV SOLN
INTRAVENOUS | Status: AC
  Filled 2018-04-30: qty 1

## 2018-04-30 MED ORDER — HYDROMORPHONE 1 MG/ML IV SOLN: INTRAVENOUS | Status: DC

## 2018-04-30 MED ORDER — DEXTROSE 5 % IV SOLN
INTRAVENOUS | Status: DC
  Administered 2018-04-30 – 2018-05-01 (×2): via INTRAVENOUS

## 2018-04-30 MED ORDER — HYDROMORPHONE 1 MG/ML IV SOLN
INTRAVENOUS | Status: DC
  Administered 2018-04-30: 1.2 mg via INTRAVENOUS
  Administered 2018-04-30: 25 mg via INTRAVENOUS
  Administered 2018-04-30: 2.1 mg via INTRAVENOUS
  Administered 2018-04-30: 1.8 mg via INTRAVENOUS
  Administered 2018-05-01: 0.3 mg via INTRAVENOUS
  Filled 2018-04-30: qty 25

## 2018-04-30 MED ORDER — NALOXONE HCL 0.4 MG/ML IJ SOLN: 0.4000 mg | INTRAMUSCULAR | Status: DC | PRN

## 2018-04-30 MED ORDER — CLONIDINE HCL 0.1 MG PO TABS
0.1000 mg | ORAL_TABLET | Freq: Three times a day (TID) | ORAL | Status: DC
  Administered 2018-04-30 (×3): 0.1 mg via ORAL
  Filled 2018-04-30 (×3): qty 1

## 2018-04-30 MED ORDER — MORPHINE SULFATE (PF) 2 MG/ML IV SOLN
2.0000 mg | INTRAVENOUS | Status: DC | PRN
  Administered 2018-04-30 – 2018-05-13 (×10): 2 mg via INTRAVENOUS
  Filled 2018-04-30 (×9): qty 1

## 2018-04-30 MED ORDER — FUROSEMIDE 10 MG/ML IJ SOLN
20.0000 mg | Freq: Once | INTRAMUSCULAR | Status: AC
  Administered 2018-04-30: 20 mg via INTRAVENOUS
  Filled 2018-04-30: qty 2

## 2018-04-30 MED ORDER — SODIUM PHOSPHATES 45 MMOLE/15ML IV SOLN
30.0000 mmol | Freq: Once | INTRAVENOUS | Status: AC
  Administered 2018-04-30: 30 mmol via INTRAVENOUS
  Filled 2018-04-30: qty 10

## 2018-04-30 MED ORDER — MORPHINE SULFATE (PF) 2 MG/ML IV SOLN
2.0000 mg | INTRAVENOUS | Status: AC
  Administered 2018-05-01: 2 mg via INTRAVENOUS

## 2018-04-30 MED ORDER — SODIUM CHLORIDE 0.9 % IV SOLN
2.0000 g | Freq: Two times a day (BID) | INTRAVENOUS | Status: DC
  Administered 2018-04-30 – 2018-05-01 (×4): 2 g via INTRAVENOUS
  Filled 2018-04-30 (×7): qty 2

## 2018-04-30 MED ORDER — NICOTINE 14 MG/24HR TD PT24
14.0000 mg | MEDICATED_PATCH | Freq: Every day | TRANSDERMAL | Status: DC
  Administered 2018-04-30 – 2018-05-02 (×3): 14 mg via TRANSDERMAL
  Filled 2018-04-30 (×3): qty 1

## 2018-04-30 MED ORDER — VANCOMYCIN HCL IN DEXTROSE 1-5 GM/200ML-% IV SOLN
1000.0000 mg | INTRAVENOUS | Status: AC
  Administered 2018-04-30 – 2018-05-01 (×2): 1000 mg via INTRAVENOUS
  Filled 2018-04-30 (×2): qty 200

## 2018-04-30 MED ORDER — MORPHINE SULFATE (PF) 2 MG/ML IV SOLN: 2.0000 mg | INTRAVENOUS | Status: AC

## 2018-04-30 MED ORDER — LEVALBUTEROL HCL 0.63 MG/3ML IN NEBU
0.6300 mg | INHALATION_SOLUTION | Freq: Four times a day (QID) | RESPIRATORY_TRACT | Status: DC
  Administered 2018-04-30 – 2018-05-04 (×14): 0.63 mg via RESPIRATORY_TRACT
  Filled 2018-04-30 (×14): qty 3

## 2018-04-30 MED ORDER — DIPHENHYDRAMINE HCL 12.5 MG/5ML PO ELIX: 12.5000 mg | ORAL_SOLUTION | Freq: Four times a day (QID) | ORAL | Status: DC | PRN

## 2018-04-30 MED ORDER — ALBUTEROL SULFATE (2.5 MG/3ML) 0.083% IN NEBU
2.5000 mg | INHALATION_SOLUTION | Freq: Four times a day (QID) | RESPIRATORY_TRACT | Status: DC
  Filled 2018-04-30: qty 3

## 2018-04-30 MED ORDER — SODIUM CHLORIDE 0.9% FLUSH: 9.0000 mL | INTRAVENOUS | Status: DC | PRN

## 2018-04-30 MED ORDER — POTASSIUM CHLORIDE 20 MEQ PO PACK
40.0000 meq | PACK | Freq: Two times a day (BID) | ORAL | Status: AC
  Administered 2018-04-30 (×2): 40 meq via ORAL
  Filled 2018-04-30 (×2): qty 2

## 2018-04-30 MED ORDER — LEVALBUTEROL HCL 0.63 MG/3ML IN NEBU
0.6300 mg | INHALATION_SOLUTION | Freq: Four times a day (QID) | RESPIRATORY_TRACT | Status: DC | PRN
  Administered 2018-04-30 – 2018-05-04 (×4): 0.63 mg via RESPIRATORY_TRACT
  Filled 2018-04-30 (×3): qty 3

## 2018-04-30 MED ORDER — DEXMEDETOMIDINE HCL IN NACL 200 MCG/50ML IV SOLN
0.0000 ug/kg/h | INTRAVENOUS | Status: DC
  Administered 2018-04-30: 1 ug/kg/h via INTRAVENOUS
  Administered 2018-04-30: 0.4 ug/kg/h via INTRAVENOUS
  Administered 2018-05-01: 1 ug/kg/h via INTRAVENOUS
  Filled 2018-04-30 (×3): qty 50

## 2018-04-30 MED ORDER — OXYCODONE HCL 5 MG/5ML PO SOLN
5.0000 mg | Freq: Four times a day (QID) | ORAL | Status: DC
  Administered 2018-04-30 – 2018-05-01 (×3): 5 mg
  Filled 2018-04-30 (×4): qty 5

## 2018-04-30 MED ORDER — MORPHINE SULFATE (PF) 2 MG/ML IV SOLN
2.0000 mg | INTRAVENOUS | Status: AC
  Administered 2018-04-30: 2 mg via INTRAVENOUS
  Filled 2018-04-30: qty 1

## 2018-04-30 MED ORDER — DEXTROSE-NACL 5-0.2 % IV SOLN
INTRAVENOUS | Status: DC
  Administered 2018-04-30: 13:00:00 via INTRAVENOUS
  Filled 2018-04-30 (×2): qty 1000

## 2018-04-30 MED ORDER — LEVALBUTEROL HCL 0.63 MG/3ML IN NEBU
INHALATION_SOLUTION | RESPIRATORY_TRACT | Status: AC
  Filled 2018-04-30: qty 3

## 2018-04-30 MED ORDER — OSMOLITE 1.5 CAL PO LIQD
1000.0000 mL | ORAL | Status: DC
  Administered 2018-04-30 – 2018-05-01 (×2): 1000 mL
  Filled 2018-04-30: qty 1000

## 2018-04-30 MED ORDER — LEVALBUTEROL HCL 0.63 MG/3ML IN NEBU
0.6300 mg | INHALATION_SOLUTION | Freq: Three times a day (TID) | RESPIRATORY_TRACT | Status: DC
  Administered 2018-04-30: 0.63 mg via RESPIRATORY_TRACT
  Filled 2018-04-30: qty 3

## 2018-04-30 MED ORDER — ONDANSETRON HCL 4 MG/2ML IJ SOLN: 4.0000 mg | Freq: Four times a day (QID) | INTRAMUSCULAR | Status: DC | PRN

## 2018-04-30 NOTE — Progress Notes (Signed)
5 Days Post-Op   Subjective/Chief Complaint: Stool yesterday, tolerated clears, had 1 liquid BM.  Still tachypneic.  Xray more definitive for pneumonia.  Pain was 6/10, so increased PCA from reduced dose to full dose.    Objective: Vital signs in last 24 hours: Temp:  [99 F (37.2 C)-100.1 F (37.8 C)] 100.1 F (37.8 C) (04/14 1200) Pulse Rate:  [110-130] 122 (04/14 1200) Resp:  [29-44] 44 (04/14 1200) BP: (128-163)/(65-90) 152/86 (04/14 1200) SpO2:  [94 %-99 %] 96 % (04/14 1200) Weight:  [60.2 kg] 60.2 kg (04/14 0500) Last BM Date: 04/26/18  Intake/Output from previous day: 04/13 0701 - 04/14 0700 In: 3883.5 [P.O.:1200; I.V.:2017.4; NG/GT:132.2; IV Piggyback:330] Out: 1696 [Urine:1975; Emesis/NG output:300; Drains:1000] Intake/Output this shift: Total I/O In: 1052.7 [P.O.:320; I.V.:642.7; Other:40; NG/GT:50] Out: 415 [Urine:415]   General appearance: A&O x3;  Resp: breathing is shallow, but pt is stable.   Cardio: regular, tachycardic- improving GI:  Soft, minimally distended, serosang drain output Extremities: extremities normal; no cyanosis or edema Pulses: palpable  Lab Results:  Recent Labs    04/29/18 0523 04/30/18 0547  WBC 18.2* 19.5*  HGB 8.6* 8.3*  HCT 26.7* 26.6*  PLT 62* 74*   BMET Recent Labs    04/29/18 0523 04/30/18 0547  NA 152* 147*  K 3.1* 3.1*  CL 115* 112*  CO2 26 24  GLUCOSE 143* 119*  BUN 36* 20  CREATININE 1.71* 1.01*  CALCIUM 8.0* 7.9*   PT/INR No results for input(s): LABPROT, INR in the last 72 hours. ABG Recent Labs    04/27/18 1612 04/28/18 2038  PHART 7.520* 7.479*  HCO3 29.4* 22.9    Studies/Results: Dg Chest Port 1 View  Result Date: 04/30/2018 CLINICAL DATA:  Shortness of breath. EXAM: PORTABLE CHEST 1 VIEW COMPARISON:  Chest x-ray dated April 28, 2018. FINDINGS: Unchanged right chest wall port catheter. Interval removal of the enteric tube. Two surgical drains in the central abdomen are unchanged. Stable  cardiomediastinal silhouette. The right hemidiaphragm remains elevated with unchanged small right pleural effusion. Slightly worsened opacity in the right upper lobe. New hazy opacity in the peripheral left upper lobe. No pneumothorax. No acute osseous abnormality. Old left-sided rib fractures again noted. IMPRESSION: 1. Slightly worsening right upper lobe airspace disease with new hazy opacity in the peripheral left upper lobe, concerning for multifocal pneumonia. 2. Unchanged right hemidiaphragm elevation and small right pleural effusion. Electronically Signed   By: Titus Dubin M.D.   On: 04/30/2018 08:05   Dg Chest Port 1 View  Result Date: 04/28/2018 CLINICAL DATA:  Abnormal respiration. EXAM: PORTABLE CHEST 1 VIEW COMPARISON:  Radiograph of April 27, 2018. FINDINGS: Stable cardiomediastinal silhouette. Nasogastric tube is seen in stomach. Old left rib fractures are noted. Right internal jugular Port-A-Cath is noted with distal tip in expected position of the SVC. Continued presence of elevated right hemidiaphragm. Mild right pleural effusion is noted. New airspace opacity is seen in right upper lobe concerning for possible pneumonia. Left lung is clear. IMPRESSION: New airspace opacity seen in right upper lobe concerning for pneumonia. Mild right pleural effusion is noted which is enlarged compared to prior exam. Stable elevated right hemidiaphragm is noted. Electronically Signed   By: Marijo Conception, M.D.   On: 04/28/2018 20:50    Anti-infectives: Anti-infectives (From admission, onward)   Start     Dose/Rate Route Frequency Ordered Stop   04/30/18 2230  vancomycin (VANCOCIN) IVPB 750 mg/150 ml premix  Status:  Discontinued  750 mg 150 mL/hr over 60 Minutes Intravenous Every 48 hours 04/28/18 2133 04/29/18 1121   04/30/18 2230  vancomycin (VANCOCIN) IVPB 1000 mg/200 mL premix  Status:  Discontinued     1,000 mg 200 mL/hr over 60 Minutes Intravenous Every 48 hours 04/29/18 1121 04/30/18  0904   04/30/18 1000  vancomycin (VANCOCIN) IVPB 1000 mg/200 mL premix     1,000 mg 200 mL/hr over 60 Minutes Intravenous Every 24 hours 04/30/18 0904     04/30/18 0915  ceFEPIme (MAXIPIME) 2 g in sodium chloride 0.9 % 100 mL IVPB     2 g 200 mL/hr over 30 Minutes Intravenous Every 12 hours 04/30/18 0905     04/28/18 2130  ceFEPIme (MAXIPIME) 2 g in sodium chloride 0.9 % 100 mL IVPB  Status:  Discontinued     2 g 200 mL/hr over 30 Minutes Intravenous Every 24 hours 04/28/18 2113 04/30/18 0905   04/28/18 2130  vancomycin (VANCOCIN) 1,250 mg in sodium chloride 0.9 % 250 mL IVPB     1,250 mg 166.7 mL/hr over 90 Minutes Intravenous  Once 04/28/18 2129 04/29/18 0107   04/25/18 1530  ceFAZolin (ANCEF) IVPB 2g/100 mL premix     2 g 200 mL/hr over 30 Minutes Intravenous Every 8 hours 04/25/18 1520 04/25/18 1739   04/25/18 0600  ceFAZolin (ANCEF) IVPB 2g/100 mL premix     2 g 200 mL/hr over 30 Minutes Intravenous On call to O.R. 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      Neuro- continue epidural and prn pain meds, scheduled robaxin as well.  Increased PCA dilaudid.  Platelet count recovering.  D/c epidural when platelet count high enough.     CV/Pulm-improving.  Cefepime for HCAP  GI- NGT out.  Drains as expected, LFTs continue to trend down.; Duplex showed patent portal and hepatic veins.  Tolerated trophic feeds.  Advance diet and increase tube feeds.   Renal- cr now back to baseline.  Has diuresed quite a bit.  Drain output down, so can decrease IV fluids.    Replete potassium. - hypokalemia.    Hypernatremia improved after changed IV fluids.  Hypophosphatemia - replete with NaPhos  Endocrine- continue ssi, glucose under good control  CCM assisting in her care   Globally is improving.  However, still needs ICU care for tenuous respiratory status.    Stark Klein 04/30/2018

## 2018-04-30 NOTE — Progress Notes (Signed)
PCCM Brief Interval Note  Asked to evaluate patient at bedside by ELink. At end of day shift, patient was initiated on BiPAP for hypoxia + tachypnea, refractory to [facility] and 6LNC.  Patient was evaluated and plan was developed with Dr. Carson Myrtle, PCCM  Patient endorses anxiety and pain, feels as though the BiPAP is aggravating her abdominal pain. At evaluation, patient HR 134, RR 34, BP 141/97 MAP 110 SpO2 92% on BiPAP 40% FiO2 PEEP 5 and Vt 400-550 ABG: 7.5/28.3/63   Acute respiratory distress with hypoxia  -acute post-op pain on chronic pain (possible component of opioid withdraw given home regimen)  -anxiety -likely component of atelectasis  P Discontinue BiPAP, titrate supplemental O2 for SpO2 >88%, can use HFNC if needed.  Precedex gtt Increase morphine IV dose (on high dose home opioids)  IS q2hr Turn/cough/deep breathe Teach patient how to splint abdomen with pillow during deep breathing      Additional Critical Care Time: 35 minutes  Eliseo Gum MSN, AGACNP-BC [place] [phone] If no answer, [phone] 04/30/2018, 8:06 PM

## 2018-04-30 NOTE — Progress Notes (Signed)
ctsp re increased wob p change in pain level control eval via elink > needs trial of bipap and will notify night rounder may need intubation if not improving    Jane Gully, MD Pulmonary and [place] [phone] After 5:30 PM or weekends, use Beeper [phone]

## 2018-04-30 NOTE — Progress Notes (Addendum)
NAME:  Jane Brooks, MRN:  [id number], DOB:  [birth year], LOS: 5 ADMISSION DATE:  04/25/2018, CONSULTATION DATE:  04/25/2018 REFERRING MD:  Barry Dienes   CHIEF COMPLAINT:  Pancreatic cancer     Brief History   48 year old lady with pancreatic cancer Presented with a borderline resectable pancreatic head mass with significant invasion Has had chemotherapy Status post laparotomy 4/9 PCCM consulted for post op hypotension, developed AKI, shock liver Extubated 4/10  4/12 developed resp distress, CXR with new opacity RUL rx as HCAP     Significant Hospital Events   Post laparotomy 4/9   Consults:  PCCM 04/25/2018  Procedures:  Laparotomy-refer to surgical op note ETT 4/9 >> 4/10  Significant Diagnostic Tests:  CT abdomen and pelvis IMPRESSION: 1. Essentially stable exam compared to most recent CT scans of 02/04/2018 and 12/12/2017. 2. Stable mass in the head of the pancreas with duct dilatation and atrophy upstream. Mass continues to surround the GDA and contacts the portal vein but is free of the celiac trunk and SMA. Mass probably contacts the RIGHT hepatic artery at the takeoff of the GDA (image 56/5). Dominant LEFT hepatic artery originates from the LEFT gastric. 3. Mild intrahepatic and extrahepatic duct dilatation similar to prior with flexible stent in the common bile duct. 4. No new or progressive disease. 5. No peritoneal metastasis.  CT chest 4/9 >> no evidence of pulmonary embolism, scattered subcentimeter mediastinal lymph nodes, subcarinal lymphadenopathy.  No pneumothorax, bibasilar atelectasis.  Postsurgical changes in the abdomen  Head CT 4/9 >> no acute findings, chronic frontal atrophy  Hepatic ultrasound 4/11 >> patent portal venous system, with normal flow pattern Renal ultrasound 4/11 >> no definite renal abnormality, no hydronephrosis or obstruction, minimal ascites  Micro Data:  [facility] 4/12 >>  Antimicrobials:  Cefazolin 04/25/2018 only (periop) Cefepime  4/12  (HCAP )>> vanc 4/12 >>  Interim history/subjective:     Objective   Blood pressure (!) 152/88, pulse (!) 118, temperature 99.1 F (37.3 C), temperature source Oral, resp. rate (!) 32, height 4' 11"  (1.499 m), weight 60.2 kg, SpO2 95 %.        Intake/Output Summary (Last 24 hours) at 04/30/2018 1041 Last data filed at 04/30/2018 1016 Gross per 24 hour  Intake 3651.78 ml  Output 3085 ml  Net 566.78 ml   Filed Weights   04/25/18 0615 04/25/18 0646 04/30/18 0500  Weight: 56.7 kg 56.7 kg 60.2 kg    Examination: Uncomfortable but not toxic  Pt alert, mild increased wob at 45 degrees  No jvd Oropharynx clear,  mucosa nl Neck supple Lungs with   exp > insp rhonchi bilaterally/ insp squeaks  RRR no s3 or or sign murmur Abd with limited  excursion  Ext  warm with no edema or clubbing noted Neuro  Sensorium nl,  no apparent motor deficits        I personally reviewed images and agree with radiology impression as follows:  CXR:   4/14 1. Slightly worsening right upper lobe airspace disease with new hazy opacity in the peripheral left upper lobe, concerning for multifocal pneumonia. 2. Unchanged right hemidiaphragm elevation and small right pleural effusion.   Resolved Hospital Problem list   Respiratory acidosis, resolved Shock, multifactorial Acute respiratory failure, mechanical ventilation Multifactorial shock Metabolic acidosis   Assessment & Plan:  Acute respiratory distress 4/12 -new RUL opacity ? HCAP vs fluid - slt worse aeration 4/14 with pos I/o's and hbp > rx clonidine / one dose lasix  Status post laparotomy, Whipple procedure for pancreatic head cancer Management as per CCS Drains in place Min narcs to help with Cabd esp with Elevated R HD    AKI, transient hypoperfusion.   - nephrology note reviewed, signed off am 4/14   Hypernatremia - change IVfs to D51/4 NS  Am 4/13 as still hypernatremic and hypertensive   Acute transaminitis -  shock liver  Improved   Hypertension/tachycardia and narc dep post op - added clonidine am  4/14      Christinia Gully, MD Pulmonary and [place] Cell [phone] After 5:30 PM or weekends, use Beeper [phone]

## 2018-04-30 NOTE — Progress Notes (Signed)
Pharmacy Antibiotic Note  Jane Brooks is a 48 y.o. female with pancreatic cancer admitted on 04/25/2018 with pneumonia.  Pharmacy has been consulted for vancomycin dosing. Also started on cefepime per MD. Antibiotic day #3. SCr continues to improve, down to 1.01 today - will adjust abx dosing.  Plan: Increase Cefepime to 2g IV q12h Increase vancomycin to 1000 mg IV Q 24 hrs - f/u d/c soon? Goal AUC 400-550. Expected AUC: 542 SCr used: 1.01 Monitor clinical progress, c/s, renal function F/u de-escalation plan/LOT, vancomycin levels as indicated   Height: 4' 11"  (149.9 cm) Weight: 132 lb 11.5 oz (60.2 kg) IBW/kg (Calculated) : 43.2  Temp (24hrs), Avg:99.5 F (37.5 C), Min:99 F (37.2 C), Max:100.1 F (37.8 C)  Recent Labs  Lab 04/27/18 0444 04/27/18 1458 04/28/18 0353 04/28/18 2129 04/29/18 0030 04/29/18 0523 04/30/18 0547  WBC 18.0* 17.6* 18.6*  --   --  18.2* 19.5*  CREATININE 3.85* 4.02* 3.24*  --   --  1.71* 1.01*  LATICACIDVEN  --   --   --  1.2 1.2  --   --     Estimated Creatinine Clearance: 53.2 mL/min (A) (by C-G formula based on SCr of 1.01 mg/dL (H)).    Allergies  Allergen Reactions  . Latex Hives   Elicia Lamp, PharmD, BCPS Please check AMION for all [place] contact numbers Clinical Pharmacist 04/30/2018 9:07 AM

## 2018-04-30 NOTE — Progress Notes (Signed)
Patient ID: Jane Brooks, female   DOB: 20-Jan-1970, 48 y.o.   MRN: [id number] [facility] Progress Note   Assessment/ Plan:   1. Acute kidney Injury: Nonoliguric with excellent UOP and renal function back to baseline on maintenance IVFs.  Hypernatremia improving and ongoing potassium replacement. 2.  Pancreatic cancer: Status post Whipple procedure on 04/25/2018 with neoadjuvant chemotherapy. 3.  Hypokalemia: Secondary to increased urinary losses/limited intake.  Will re-order enteral replacement. 4.  Hypernatremia: Excellent urine output with furosemide overnight.  Improving with D5/0.45 saline.  Will sign off--call with questions  Subjective:   Reports to be in pain from surgical site. NGT out and on CLs   Objective:   BP (!) 152/88 (BP Location: Right Arm)   Pulse (!) 118   Temp 99.1 F (37.3 C) (Oral)   Resp (!) 34   Ht 4' 11"  (1.499 m)   Wt 60.2 kg   SpO2 97%   BMI 26.81 kg/m   Intake/Output Summary (Last 24 hours) at 04/30/2018 7829 Last data filed at 04/30/2018 0800 Gross per 24 hour  Intake 4019.44 ml  Output 3085 ml  Net 934.44 ml   Weight change:   Physical Exam: Gen: Uncomfortably resting in bed, oxygen via nasal cannula CVS: Pulse regular tachycardia, S1 and S2 with ejection systolic murmur Resp: Poor inspiratory effort with decreased breath sounds over bases Abd: Soft, serosanguineous drain output Ext: No lower extremity edema  Imaging: Dg Chest Port 1 View  Result Date: 04/30/2018 CLINICAL DATA:  Shortness of breath. EXAM: PORTABLE CHEST 1 VIEW COMPARISON:  Chest x-ray dated April 28, 2018. FINDINGS: Unchanged right chest wall port catheter. Interval removal of the enteric tube. Two surgical drains in the central abdomen are unchanged. Stable cardiomediastinal silhouette. The right hemidiaphragm remains elevated with unchanged small right pleural effusion. Slightly worsened opacity in the right upper lobe. New hazy opacity in the peripheral left  upper lobe. No pneumothorax. No acute osseous abnormality. Old left-sided rib fractures again noted. IMPRESSION: 1. Slightly worsening right upper lobe airspace disease with new hazy opacity in the peripheral left upper lobe, concerning for multifocal pneumonia. 2. Unchanged right hemidiaphragm elevation and small right pleural effusion. Electronically Signed   By: Titus Dubin M.D.   On: 04/30/2018 08:05   Dg Chest Port 1 View  Result Date: 04/28/2018 CLINICAL DATA:  Abnormal respiration. EXAM: PORTABLE CHEST 1 VIEW COMPARISON:  Radiograph of April 27, 2018. FINDINGS: Stable cardiomediastinal silhouette. Nasogastric tube is seen in stomach. Old left rib fractures are noted. Right internal jugular Port-A-Cath is noted with distal tip in expected position of the SVC. Continued presence of elevated right hemidiaphragm. Mild right pleural effusion is noted. New airspace opacity is seen in right upper lobe concerning for possible pneumonia. Left lung is clear. IMPRESSION: New airspace opacity seen in right upper lobe concerning for pneumonia. Mild right pleural effusion is noted which is enlarged compared to prior exam. Stable elevated right hemidiaphragm is noted. Electronically Signed   By: Marijo Conception, M.D.   On: 04/28/2018 20:50    Labs: BMET Recent Labs  Lab 04/25/18 1539  04/26/18 0513  04/26/18 1614  04/27/18 0444 04/27/18 1458 04/27/18 1612 04/28/18 0353 04/28/18 2038 04/29/18 0523 04/30/18 0547  NA 140   < > 141   < > 139   < > 143 143 144 146* 151* 152* 147*  K 4.6   < > 4.9   < > 4.6   < > 3.9 4.0 4.0 3.4* 3.3*  3.1* 3.1*  CL 105   < > 100  --  94*  --  99 102  --  109  --  115* 112*  CO2 17*   < > 19*  --  24  --  28 24  --  24  --  26 24  GLUCOSE 246*   < > 127*  --  124*  --  101* 153*  --  120*  --  143* 119*  BUN 9   < > 19  --  38*  --  49* 54*  --  53*  --  36* 20  CREATININE 1.17*   < > 1.93*  --  3.28*  --  3.85* 4.02*  --  3.24*  --  1.71* 1.01*  CALCIUM 8.5*   < >  8.1*  --  8.3*  --  8.5* 8.5*  --  8.2*  --  8.0* 7.9*  PHOS 9.4*  --  7.5*  --   --   --  4.1  --   --  4.4  --  4.1 2.2*   < > = values in this interval not displayed.   CBC Recent Labs  Lab 04/25/18 1800  04/27/18 1458  04/28/18 0353 04/28/18 2038 04/29/18 0523 04/30/18 0547  WBC 23.6*   < > 17.6*  --  18.6*  --  18.2* 19.5*  NEUTROABS 21.1*  --   --   --   --   --   --   --   HGB 10.9*   < > 8.0*   < > 8.3* 8.5* 8.6* 8.3*  HCT 33.9*   < > 23.7*   < > 25.5* 25.0* 26.7* 26.6*  MCV 70.2*   < > 72.5*  --  73.9*  --  76.7* 76.7*  PLT 170   < > 64*  --  61*  --  62* 74*   < > = values in this interval not displayed.   Medications:    . Chlorhexidine Gluconate Cloth  6 each Topical Daily  . feeding supplement (OSMOLITE 1.5 CAL)  1,000 mL Per Tube Q24H  . HYDROmorphone   Intravenous Q4H  . insulin aspart  0-9 Units Subcutaneous Q4H  . mouth rinse  15 mL Mouth Rinse BID  . pantoprazole (PROTONIX) IV  40 mg Intravenous QHS  . sodium chloride flush  10-40 mL Intracatheter Q12H   Elmarie Shiley, MD 04/30/2018, 9:26 AM

## 2018-04-30 NOTE — Anesthesia Post-op Follow-up Note (Signed)
  Anesthesia Pain Follow-up Note  Patient: Kyle Stansell  Day #: 5  Date of Follow-up: 04/30/2018 Time: 2:30 PM  Last Vitals:  Vitals:   04/30/18 1140 04/30/18 1200  BP:  (!) 152/86  Pulse:  (!) 122  Resp: (!) 37 (!) 44  Temp:  37.8 C  SpO2: 96% 96%    Level of Consciousness: Sleepy but arousable. Responds appropriately.  Pain: moderate. She had no anesthetic level to ice. I dosed the catheter with a total of 10cc of 1.5% Lidocaine with 1:200k epi and increased the rate to 12cc/hr. She still had no level to ice so I stopped the infusion. Side Effects:None  Catheter Site Exam:clean, dry, no drainage  Epidural / Intrathecal (From admission, onward)   Start     Dose/Rate Route Frequency Ordered Stop   04/26/18 1645  ropivacaine (PF) 2 mg/mL (0.2%) (NAROPIN) injection     5-10 mL/hr 5-10 mL/hr  Epidural Continuous 04/26/18 1632         Plan: D/C Infusion at surgeon's request. Pt had no anesthetic level despite bolus dose and increasing the rate of infusion. Catheter unable to be removed today due to platelet count of 74K. Will check coags and another platelet count tomorrow morning. If those values need correction, that can be done during the day with hopes to pull the catheter tomorrow afternoon/evening.  Delmar Arriaga,W. EDMOND

## 2018-05-01 ENCOUNTER — Inpatient Hospital Stay (HOSPITAL_COMMUNITY): Payer: Medicaid Other

## 2018-05-01 DIAGNOSIS — R000 Tachycardia, unspecified: Secondary | ICD-10-CM

## 2018-05-01 DIAGNOSIS — R0682 Tachypnea, not elsewhere classified: Secondary | ICD-10-CM

## 2018-05-01 LAB — COMPREHENSIVE METABOLIC PANEL
ALT: 265 U/L — ABNORMAL HIGH (ref 0–44)
AST: 64 U/L — ABNORMAL HIGH (ref 15–41)
Albumin: 2.4 g/dL — ABNORMAL LOW (ref 3.5–5.0)
Alkaline Phosphatase: 111 U/L (ref 38–126)
Anion gap: 10 (ref 5–15)
BUN: 16 mg/dL (ref 6–20)
CO2: 22 mmol/L (ref 22–32)
Calcium: 7.2 mg/dL — ABNORMAL LOW (ref 8.9–10.3)
Chloride: 110 mmol/L (ref 98–111)
Creatinine, Ser: 0.96 mg/dL (ref 0.44–1.00)
GFR calc Af Amer: >60 mL/min (ref >60)
GFR calc non Af Amer: >60 mL/min (ref >60)
Glucose, Bld: 158 mg/dL — ABNORMAL HIGH (ref 70–99)
Potassium: 3.3 mmol/L — ABNORMAL LOW (ref 3.5–5.1)
Sodium: 142 mmol/L (ref 135–145)
Total Bilirubin: 2.1 mg/dL — ABNORMAL HIGH (ref 0.3–1.2)
Total Protein: 4.7 g/dL — ABNORMAL LOW (ref 6.5–8.1)

## 2018-05-01 LAB — CBC
HCT: 24.8 % — ABNORMAL LOW (ref 36.0–46.0)
Hemoglobin: 7.8 g/dL — ABNORMAL LOW (ref 12.0–15.0)
MCH: 23.9 pg — ABNORMAL LOW (ref 26.0–34.0)
MCHC: 31.5 g/dL (ref 30.0–36.0)
MCV: 75.8 fL — ABNORMAL LOW (ref 80.0–100.0)
Platelets: 73 10*3/uL — ABNORMAL LOW (ref 150–400)
RBC: 3.27 MIL/uL — ABNORMAL LOW (ref 3.87–5.11)
RDW: 20.8 % — ABNORMAL HIGH (ref 11.5–15.5)
WBC: 14 10*3/uL — ABNORMAL HIGH (ref 4.0–10.5)
nRBC: 0 % (ref 0.0–0.2)

## 2018-05-01 LAB — POCT I-STAT 7, (LYTES, BLD GAS, ICA,H+H)
Acid-Base Excess: 1 mmol/L (ref 0.0–2.0)
Bicarbonate: 25.7 mmol/L (ref 20.0–28.0)
Calcium, Ion: 1.11 mmol/L — ABNORMAL LOW (ref 1.15–1.40)
HCT: 26 % — ABNORMAL LOW (ref 36.0–46.0)
Hemoglobin: 8.8 g/dL — ABNORMAL LOW (ref 12.0–15.0)
O2 Saturation: 100 %
Patient temperature: 98.1
Potassium: 3.1 mmol/L — ABNORMAL LOW (ref 3.5–5.1)
Sodium: 143 mmol/L (ref 135–145)
TCO2: 27 mmol/L (ref 22–32)
pCO2 arterial: 41.7 mmHg (ref 32.0–48.0)
pH, Arterial: 7.397 (ref 7.350–7.450)
pO2, Arterial: 217 mmHg — ABNORMAL HIGH (ref 83.0–108.0)

## 2018-05-01 LAB — GLUCOSE, CAPILLARY
Glucose-Capillary: 169 mg/dL — ABNORMAL HIGH (ref 70–99)
Glucose-Capillary: 77 mg/dL (ref 70–99)
Glucose-Capillary: 96 mg/dL (ref 70–99)
Glucose-Capillary: 125 mg/dL — ABNORMAL HIGH (ref 70–99)
Glucose-Capillary: 138 mg/dL — ABNORMAL HIGH (ref 70–99)

## 2018-05-01 LAB — PHOSPHORUS: Phosphorus: 3.2 mg/dL (ref 2.5–4.6)

## 2018-05-01 LAB — PROTIME-INR
INR: 1.7 — ABNORMAL HIGH (ref 0.8–1.2)
Prothrombin Time: 20.1 seconds — ABNORMAL HIGH (ref 11.4–15.2)

## 2018-05-01 MED ORDER — MIDAZOLAM HCL 2 MG/2ML IJ SOLN
2.0000 mg | INTRAMUSCULAR | Status: DC | PRN
  Administered 2018-05-01 – 2018-05-11 (×23): 2 mg via INTRAVENOUS
  Filled 2018-05-01 (×22): qty 2

## 2018-05-01 MED ORDER — FENTANYL CITRATE (PF) 100 MCG/2ML IJ SOLN
100.0000 ug | Freq: Once | INTRAMUSCULAR | Status: AC
  Administered 2018-05-01: 01:00:00 100 ug via INTRAVENOUS

## 2018-05-01 MED ORDER — HYDROMORPHONE HCL 1 MG/ML IJ SOLN: 1.0000 mg | INTRAMUSCULAR | Status: DC | PRN

## 2018-05-01 MED ORDER — OSMOLITE 1.5 CAL PO LIQD
1000.0000 mL | ORAL | Status: DC
  Filled 2018-05-01: qty 1000

## 2018-05-01 MED ORDER — CHLORHEXIDINE GLUCONATE 0.12% ORAL RINSE (MEDLINE KIT)
15.0000 mL | Freq: Two times a day (BID) | OROMUCOSAL | Status: DC
  Administered 2018-05-01 – 2018-05-11 (×22): 15 mL via OROMUCOSAL

## 2018-05-01 MED ORDER — KCL IN DEXTROSE-NACL 40-5-0.45 MEQ/L-%-% IV SOLN
INTRAVENOUS | Status: DC
  Administered 2018-05-01 – 2018-05-03 (×4): via INTRAVENOUS
  Administered 2018-05-04: 1 mL via INTRAVENOUS
  Administered 2018-05-04 – 2018-05-15 (×10): via INTRAVENOUS
  Filled 2018-05-01 (×17): qty 1000

## 2018-05-01 MED ORDER — PRO-STAT SUGAR FREE PO LIQD
30.0000 mL | Freq: Two times a day (BID) | ORAL | Status: DC
  Administered 2018-05-01 – 2018-05-15 (×28): 30 mL
  Filled 2018-05-01 (×28): qty 30

## 2018-05-01 MED ORDER — ACETAMINOPHEN 160 MG/5ML PO SOLN
650.0000 mg | Freq: Four times a day (QID) | ORAL | Status: DC | PRN
  Administered 2018-05-01 – 2018-05-05 (×14): 650 mg
  Filled 2018-05-01 (×12): qty 20.3

## 2018-05-01 MED ORDER — ROCURONIUM BROMIDE 50 MG/5ML IV SOLN
75.0000 mg | Freq: Once | INTRAVENOUS | Status: AC
  Administered 2018-05-01: 75 mg via INTRAVENOUS

## 2018-05-01 MED ORDER — FENTANYL CITRATE (PF) 100 MCG/2ML IJ SOLN
INTRAMUSCULAR | Status: AC
  Filled 2018-05-01: qty 2

## 2018-05-01 MED ORDER — DOCUSATE SODIUM 50 MG/5ML PO LIQD: 100.0000 mg | Freq: Two times a day (BID) | ORAL | Status: DC | PRN

## 2018-05-01 MED ORDER — LORAZEPAM 2 MG/ML IJ SOLN
INTRAMUSCULAR | Status: AC
  Filled 2018-05-01: qty 1

## 2018-05-01 MED ORDER — ALBUMIN HUMAN 25 % IV SOLN
25.0000 g | Freq: Once | INTRAVENOUS | Status: AC
  Administered 2018-05-01: 25 g via INTRAVENOUS
  Filled 2018-05-01: qty 100

## 2018-05-01 MED ORDER — DIPHENHYDRAMINE HCL 50 MG/ML IJ SOLN
INTRAMUSCULAR | Status: AC
  Filled 2018-05-01: qty 1

## 2018-05-01 MED ORDER — OSMOLITE 1.5 CAL PO LIQD
1000.0000 mL | ORAL | Status: DC
  Administered 2018-05-01 – 2018-05-10 (×9): 1000 mL
  Filled 2018-05-01 (×17): qty 1000

## 2018-05-01 MED ORDER — LORAZEPAM 2 MG/ML IJ SOLN
2.0000 mg | Freq: Once | INTRAMUSCULAR | Status: AC
  Administered 2018-05-01: 2 mg via INTRAVENOUS

## 2018-05-01 MED ORDER — POTASSIUM CHLORIDE 20 MEQ/15ML (10%) PO SOLN
20.0000 meq | ORAL | Status: AC
  Administered 2018-05-01 (×2): 20 meq
  Filled 2018-05-01 (×2): qty 15

## 2018-05-01 MED ORDER — ORAL CARE MOUTH RINSE
15.0000 mL | OROMUCOSAL | Status: DC
  Administered 2018-05-01 – 2018-05-11 (×106): 15 mL via OROMUCOSAL

## 2018-05-01 MED ORDER — MIDAZOLAM HCL 2 MG/2ML IJ SOLN
2.0000 mg | INTRAMUSCULAR | Status: DC | PRN
  Administered 2018-05-01: 2 mg via INTRAVENOUS
  Filled 2018-05-01 (×2): qty 2

## 2018-05-01 MED ORDER — MIDAZOLAM HCL 2 MG/2ML IJ SOLN
5.0000 mg | INTRAMUSCULAR | Status: DC | PRN
  Administered 2018-05-01 – 2018-05-03 (×6): 5 mg via INTRAVENOUS
  Filled 2018-05-01 (×6): qty 6

## 2018-05-01 MED ORDER — SODIUM CHLORIDE 0.9% IV SOLUTION: Freq: Once | INTRAVENOUS | Status: DC

## 2018-05-01 MED ORDER — ETOMIDATE 2 MG/ML IV SOLN
20.0000 mg | Freq: Once | INTRAVENOUS | Status: AC
  Administered 2018-05-01: 01:00:00 20 mg via INTRAVENOUS

## 2018-05-01 MED ORDER — PANTOPRAZOLE SODIUM 40 MG IV SOLR: 40.0000 mg | Freq: Every day | INTRAVENOUS | Status: DC

## 2018-05-01 MED ORDER — MORPHINE 100MG IN NS 100ML (1MG/ML) PREMIX INFUSION
0.0000 mg/h | INTRAVENOUS | Status: DC
  Administered 2018-05-01: 02:00:00 4 mg/h via INTRAVENOUS
  Administered 2018-05-01: 17:00:00 8 mg/h via INTRAVENOUS
  Administered 2018-05-02: 05:00:00 10 mg/h via INTRAVENOUS
  Administered 2018-05-02 – 2018-05-03 (×2): 8 mg/h via INTRAVENOUS
  Administered 2018-05-03: 21:00:00 6 mg/h via INTRAVENOUS
  Administered 2018-05-04: 12:00:00 7 mg/h via INTRAVENOUS
  Administered 2018-05-05 (×2): 6 mg/h via INTRAVENOUS
  Administered 2018-05-06: 2 mg/h via INTRAVENOUS
  Administered 2018-05-08: 4 mg/h via INTRAVENOUS
  Administered 2018-05-10: 3 mg/h via INTRAVENOUS
  Filled 2018-05-01 (×15): qty 100

## 2018-05-01 NOTE — Progress Notes (Signed)
NAME:  Jane Brooks, MRN:  [id number], DOB:  October 09, 1970, LOS: 6 ADMISSION DATE:  04/25/2018, CONSULTATION DATE:  04/25/2018 REFERRING MD:  Barry Dienes   CHIEF COMPLAINT:  Pancreatic cancer     Brief History   47 yowbf with pancreatic cancer Presented with a borderline resectable pancreatic head mass with significant invasion Has had chemotherapy Status post laparotomy 4/9 PCCM consulted for post op hypotension, developed AKI, shock liver Extubated 4/10  4/12 developed resp distress, CXR with new opacity RUL rx as HCAP   4/15 am re-et for hcap/ high wob     Significant Hospital Events   Post laparotomy 4/9   Consults:  PCCM 04/25/2018  Procedures:  Laparotomy-refer to surgical op note ETT 4/9 >> 4/10 Re-et 4/15 >   Significant Diagnostic Tests:  CT abdomen and pelvis IMPRESSION: 1. Essentially stable exam compared to most recent CT scans of 02/04/2018 and 12/12/2017. 2. Stable mass in the head of the pancreas with duct dilatation and atrophy upstream. Mass continues to surround the GDA and contacts the portal vein but is free of the celiac trunk and SMA. Mass probably contacts the RIGHT hepatic artery at the takeoff of the GDA (image 56/5). Dominant LEFT hepatic artery originates from the LEFT gastric. 3. Mild intrahepatic and extrahepatic duct dilatation similar to prior with flexible stent in the common bile duct. 4. No new or progressive disease. 5. No peritoneal metastasis.  CT chest 4/9 >> no evidence of pulmonary embolism, scattered subcentimeter mediastinal lymph nodes, subcarinal lymphadenopathy.  No pneumothorax, bibasilar atelectasis.  Postsurgical changes in the abdomen  Head CT 4/9 >> no acute findings, chronic frontal atrophy  Hepatic ultrasound 4/11 >> patent portal venous system, with normal flow pattern Renal ultrasound 4/11 >> no definite renal abnormality, no hydronephrosis or obstruction, minimal ascites  Micro Data:  MRSA screen neg 04/25/18 [facility]  4/12 >> ET 4/15 >>    Antimicrobials:  Cefazolin 04/25/2018 only (periop) Vanc 4/12 >> 4/15  Cefepime 4/12  (HCAP )>>    Interim history/subjective:   increased wob/ desats / poor pain control > re et early am today     Objective   Blood pressure (!) 97/55, pulse (!) 119, temperature (!) 97.5 F (36.4 C), temperature source Axillary, resp. rate (!) 34, height 4' 11"  (1.499 m), weight 58.6 kg, SpO2 100 %.    Vent Mode: PRVC FiO2 (%):  [40 %-100 %] 50 % Set Rate:  [15 bmp-18 bmp] 18 bmp Vt Set:  [450 mL] 450 mL PEEP:  [5 cmH20] 5 cmH20 Plateau Pressure:  [24 BBC48-88 cmH20] 24 cmH20   Intake/Output Summary (Last 24 hours) at 05/01/2018 0847 Last data filed at 05/01/2018 0700 Gross per 24 hour  Intake 2674.05 ml  Output 4079 ml  Net -1404.95 ml   Filed Weights   04/25/18 0646 04/30/18 0500 05/01/18 0200  Weight: 56.7 kg 60.2 kg 58.6 kg    Examination: Pt alert, approp nad @ 45 degrees on vent/ sedated on MS drip No jvd Oropharynx et  Neck supple Lungs with minimal  rhonchi bilaterally RRR no s3 or or sign murmur Abd mild distention/ nl  excursion  Ext  warm with no edema or clubbing noted/ PAS  Neuro  Sedated goal  RASS        I personally reviewed images and agree with radiology impression as follows:  CXR: 4/15 Stable bilateral infiltrates.    Resolved Hospital Problem list   Respiratory acidosis, resolved Shock, multifactorial Acute respiratory failure, mechanical  ventilation - recurred am 4/15  Multifactorial shock Metabolic acidosis   Assessment & Plan:  Acute respiratory distress 4/12 -new RUL opacity ? HCAP vs fluid - back on vent/ et too far pull back x 2 cm am 4/15 - check ET gm stain/ continue maxepime but d/c vanc p today's doses    Status post laparotomy, Whipple procedure for pancreatic head cancer Management as per CCS Drains in place Pain control an issue after ET out but for now it's back in and doing fine on MS drip will titrate to  RASS  -1 or -2      AKI, transient hypoperfusion.   - nephrology note reviewed, signed off am 4/14    Lab Results  Component Value Date   CREATININE 0.96 05/01/2018   CREATININE 1.01 (H) 04/30/2018   CREATININE 1.71 (H) 04/29/2018   CREATININE 0.65 04/04/2018   CREATININE 0.66 03/20/2018   CREATININE 0.66 03/06/2018    Trend creat/ vol expand with albumin am 4/15 as bp soft after diuresis 4/14   Hypernatremia - changed  IVfs to D51/4 NS  Am 4/13 as still hypernatremic and hypertensive > resolved am 4/15 on d5w 75 cc/h     Hypertension/tachycardia and narc dep post op - added clonidine am  4/14 > low bp am 4/15 so d/c'd clonidine/ vol exp with alb x 2 amps    The patient is critically ill with multiple organ systems failure and requires high complexity decision making for assessment and support, frequent evaluation and titration of therapies, application of advanced monitoring technologies and extensive interpretation of multiple databases. Critical Care Time devoted to patient care services described in this note is 45 minutes.      Christinia Gully, MD Pulmonary and [place] [phone] After 5:30 PM or weekends, use Beeper [phone]

## 2018-05-01 NOTE — Progress Notes (Signed)
6 Days Post-Op   Subjective/Chief Complaint: Required reintubation last night for increased WOB and failure of BiPAP.  Stooling.     Objective: Vital signs in last 24 hours: Temp:  [97.5 F (36.4 C)-100.3 F (37.9 C)] 97.5 F (36.4 C) (04/15 0800) Pulse Rate:  [65-139] 119 (04/15 0800) Resp:  [17-58] 34 (04/15 0800) BP: (69-169)/(46-118) 97/55 (04/15 0800) SpO2:  [69 %-100 %] 100 % (04/15 0831) FiO2 (%):  [40 %-100 %] 50 % (04/15 0800) Weight:  [58.6 kg] 58.6 kg (04/15 0200) Last BM Date: 04/26/18  Intake/Output from previous day: 04/14 0701 - 04/15 0700 In: 3057.1 [P.O.:320; I.V.:1877.3; NG/GT:502.2; IV Piggyback:200.3] Out: 4189 [Urine:2859; Drains:1330] Intake/Output this shift: Total I/O In: 20 [I.V.:20] Out: -    General appearance: sedated, ventilated.   Resp: breathing comfortable.  Coarse BS R>L Cardio: regular, tachycardic GI:  Soft, minimally distended, serosang drain output.  Less anasarca in abdominal wall.  Diuresing still via drains.   Extremities: extremities normal; no cyanosis or edema Pulses: palpable  Lab Results:  Recent Labs    04/30/18 0547  05/01/18 0211 05/01/18 0437  WBC 19.5*  --   --  14.0*  HGB 8.3*   < > 8.8* 7.8*  HCT 26.6*   < > 26.0* 24.8*  PLT 74*  --   --  73*   < > = values in this interval not displayed.   BMET Recent Labs    04/30/18 0547  05/01/18 0211 05/01/18 0437  NA 147*   < > 143 142  K 3.1*   < > 3.1* 3.3*  CL 112*  --   --  110  CO2 24  --   --  22  GLUCOSE 119*  --   --  158*  BUN 20  --   --  16  CREATININE 1.01*  --   --  0.96  CALCIUM 7.9*  --   --  7.2*   < > = values in this interval not displayed.   PT/INR Recent Labs    05/01/18 0437  LABPROT 20.1*  INR 1.7*   ABG Recent Labs    04/30/18 1958 05/01/18 0211  PHART 7.507* 7.397  HCO3 22.4 25.7    Studies/Results: Portable Chest X-ray  Result Date: 05/01/2018 CLINICAL DATA:  Check endotracheal tube placement EXAM: PORTABLE CHEST 1  VIEW COMPARISON:  Film from earlier in the same day. FINDINGS: Cardiac shadow is stable. Endotracheal tube is noted 1 cm above the carina. Right chest wall port is again seen and stable. Patchy infiltrates are seen bilaterally similar to that noted on the prior exam. No new focal abnormality is noted. IMPRESSION: Endotracheal tube as described. Stable bilateral infiltrates. Electronically Signed   By: Inez Catalina M.D.   On: 05/01/2018 02:12   Dg Chest Port 1 View  Result Date: 04/30/2018 CLINICAL DATA:  Tachypnea.  Status post Whipple procedure. EXAM: PORTABLE CHEST 1 VIEW COMPARISON:  Earlier today at 0500 hours. FINDINGS: Right Port-A-Cath tip at low SVC. Moderate right hemidiaphragm elevation. Normal heart size. Small right pleural effusion is improved to resolved. No pneumothorax. Minimally improved aeration with bilateral airspace opacities remaining. Remote left rib fractures. IMPRESSION: Minimally improved multifocal airspace disease, favoring pneumonia. Improved to resolved right pleural effusion. Electronically Signed   By: Abigail Miyamoto M.D.   On: 04/30/2018 21:02   Dg Chest Port 1 View  Result Date: 04/30/2018 CLINICAL DATA:  Shortness of breath. EXAM: PORTABLE CHEST 1 VIEW COMPARISON:  Chest x-ray dated  April 28, 2018. FINDINGS: Unchanged right chest wall port catheter. Interval removal of the enteric tube. Two surgical drains in the central abdomen are unchanged. Stable cardiomediastinal silhouette. The right hemidiaphragm remains elevated with unchanged small right pleural effusion. Slightly worsened opacity in the right upper lobe. New hazy opacity in the peripheral left upper lobe. No pneumothorax. No acute osseous abnormality. Old left-sided rib fractures again noted. IMPRESSION: 1. Slightly worsening right upper lobe airspace disease with new hazy opacity in the peripheral left upper lobe, concerning for multifocal pneumonia. 2. Unchanged right hemidiaphragm elevation and small right  pleural effusion. Electronically Signed   By: Titus Dubin M.D.   On: 04/30/2018 08:05    Anti-infectives: Anti-infectives (From admission, onward)   Start     Dose/Rate Route Frequency Ordered Stop   04/30/18 2230  vancomycin (VANCOCIN) IVPB 750 mg/150 ml premix  Status:  Discontinued     750 mg 150 mL/hr over 60 Minutes Intravenous Every 48 hours 04/28/18 2133 04/29/18 1121   04/30/18 2230  vancomycin (VANCOCIN) IVPB 1000 mg/200 mL premix  Status:  Discontinued     1,000 mg 200 mL/hr over 60 Minutes Intravenous Every 48 hours 04/29/18 1121 04/30/18 0904   04/30/18 1000  vancomycin (VANCOCIN) IVPB 1000 mg/200 mL premix     1,000 mg 200 mL/hr over 60 Minutes Intravenous Every 24 hours 04/30/18 0904 05/01/18 1011   04/30/18 0915  ceFEPIme (MAXIPIME) 2 g in sodium chloride 0.9 % 100 mL IVPB     2 g 200 mL/hr over 30 Minutes Intravenous Every 12 hours 04/30/18 0905     04/28/18 2130  ceFEPIme (MAXIPIME) 2 g in sodium chloride 0.9 % 100 mL IVPB  Status:  Discontinued     2 g 200 mL/hr over 30 Minutes Intravenous Every 24 hours 04/28/18 2113 04/30/18 0905   04/28/18 2130  vancomycin (VANCOCIN) 1,250 mg in sodium chloride 0.9 % 250 mL IVPB     1,250 mg 166.7 mL/hr over 90 Minutes Intravenous  Once 04/28/18 2129 04/29/18 0107   04/25/18 1530  ceFAZolin (ANCEF) IVPB 2g/100 mL premix     2 g 200 mL/hr over 30 Minutes Intravenous Every 8 hours 04/25/18 1520 04/25/18 1739   04/25/18 0600  ceFAZolin (ANCEF) IVPB 2g/100 mL premix     2 g 200 mL/hr over 30 Minutes Intravenous On call to O.R. 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 1 LN positive and vein margin positive.       Neuro- on morphine gtt and PRN versed for sedation/analgesia.  Plan epidural removal once INR 1.5 or lower.  Giving FFP today.  Will need platelets day of removal if plt count below 100K.    Pulm- reintubated for respiratory distress  and increased WOB.   Cefepime for HCAP  CV- tachycardia stable.  Pt young.  Appears reactive in nature.  Improved since post op from 140s/150s to low 110s.  Has not needed pressors this last 24 hours.  GI-  Drains as expected, LFTs continue to trend down. Duplex showed patent portal and hepatic veins.  Tube feeds to goal.    Renal- cr now back to baseline.  Has diuresed quite a bit.  Stable I&O since admit at this point.    FEN:  Replete potassium. - hypokalemia.  Hypernatremia - resolved.  Hypophosphatemia - resolved.  Endocrine- hyperglycemia, continue ssi, glucose under reasonable control. At risk of developing diabetes given  amount of pancreas removed.  Will see how blood sugars settle out once acute stress improved.    Heme:  ABL anemia on anemia of chronic dx, anemia of chemotherapy.    VTE ppx- SCDs, no SQ lovenox at this point given platelet count.     CCM managing vent/sedation/HCAP   Global:  Unsurprised about reintubation.  Was tiring out.  WBCs coming down.  Diuresis more complete at this point.  Hopefully PNA will resolve and she can reextubate in the next few days.  Morphine gtt seems to be making her more comfortable and she was on MS contin pre op.    Stark Klein 05/01/2018

## 2018-05-01 NOTE — Addendum Note (Signed)
Addendum  created 05/01/18 1840 by Roberts Gaudy, MD   Clinical Note Signed

## 2018-05-01 NOTE — Progress Notes (Addendum)
Nutrition Follow-up  RD working remotely.  DOCUMENTATION CODES:   Not applicable  INTERVENTION:   Continue Osmolite 1.5 @ 41 ml/hr via J-tube Add 30 ml Prostat BID  Tube feeding regimen provides 1676 kcal (106% of needs), 91 grams of protein, and 749 ml of H2O.   NUTRITION DIAGNOSIS:   Increased nutrient needs related to cancer and cancer related treatments as evidenced by estimated needs.  Ongoing  GOAL:   Patient will meet greater than or equal to 90% of their needs  Progressing  MONITOR:   PO intake, Diet advancement, Labs, Weight trends, TF tolerance, Skin, I & O's  REASON FOR ASSESSMENT:   Consult Enteral/tube feeding initiation and management  ASSESSMENT:   48 year old female who presented on 4/09 for diagnostic laparoscopy, pancreaticoduodenectomy, portal vein reconstruction, placement of pancreatic stent, and placement of J-tube. PMH of adenoma of the pancreatic head and had been receiving chemotherapy.  4/9 s/p whipple 4/10 pt self extubated, NG tube replaced, j-tube placed 4/11 rectal tube removed 4/13 NGT d/c, advanced to clear liquids 4/14 TF advanced to goal 4/15 pt intubated early this am, noted pt with PNA  Patient is currently intubated on ventilator support MV: 12.4 L/min Temp (24hrs), Avg:99 F (37.2 C), Min:97.5 F (36.4 C), Max:100.3 F (37.9 C)  Drain x 2 output:  1: 80 ml  2: 1250 ml    Labs reviewed: K: 3.3 (L) (on IV supplementation), PO4: 2.4 (L)  CBGS: 77-169 (inpatient orders for glycemic control are 0-9 units insulin aspart every 4 hours).   Diet Order:   Diet Order            Diet NPO time specified  Diet effective now              EDUCATION NEEDS:   Not appropriate for education at this time  Skin:  Skin Assessment: Skin Integrity Issues: Skin Integrity Issues:: Incisions Incisions: closed abdomen  Last BM:  04/26/18  Height:   Ht Readings from Last 1 Encounters:  04/25/18 4' 11"  (1.499 m)    Weight:    Wt Readings from Last 1 Encounters:  05/01/18 58.6 kg    Ideal Body Weight:  43.2 kg  BMI:  Body mass index is 26.09 kg/m.  Estimated Nutritional Needs:   Kcal:  1577  Protein:  70-90 grams  Fluid:  > 1.7 L/day   Maylon Peppers RD, LDN, CNSC [phone] Pager [phone] After Hours Pager

## 2018-05-01 NOTE — Progress Notes (Signed)
Attempted to give first unit of FFP to patient. Temp of 101.7. Attending notified of elevated temp. Attending stated to go ahead and give FFP.

## 2018-05-01 NOTE — Progress Notes (Signed)
Pulled ETT tube back 2 cm per MD order. Tube is now at 21 at the lip.

## 2018-05-01 NOTE — Progress Notes (Signed)
Anesthesiology follow-up:  Discussed with Dr. Barry Dienes.  As noted patient reintubated last night for respiratory failure now sedated on vent with morphine drip.  Epidural infusion turned off.    PT/INR: 20.1/1.7 Platelet count: 73,000  Patient received 2 units FFP today. Plan to remove epidural catheter when INR 1.5 or lower and platelet count> 100,00. Will recheck  coag studies in AM, plan to give platelets prior to d/cing epidural if platelets < 100,000.  Roberts Gaudy

## 2018-05-01 NOTE — Progress Notes (Signed)
[facility] ADULT ICU REPLACEMENT PROTOCOL FOR AM LAB REPLACEMENT ONLY  The patient does apply for the [facility] Adult ICU Electrolyte Replacment Protocol based on the criteria listed below:   1. Is GFR >/= 40 ml/min? Yes.    Patient's GFR today is >60 2. Is urine output >/= 0.5 ml/kg/hr for the last 6 hours? Yes.   Patient's UOP is 2.0 ml/kg/hr 3. Is BUN < 60 mg/dL? Yes.    Patient's BUN today is 16 4. Abnormal electrolyte(s):K3.3 5. Ordered repletion with: per protocol  6. If a panic level lab has been reported, has the CCM MD in charge been notified? Yes.  .   Physician:  Levada Schilling MD  Vear Clock 05/01/2018 6:40 AM

## 2018-05-01 NOTE — Progress Notes (Signed)
RT trying to get sputum culture. Will keep trying and send down when obtained.

## 2018-05-01 NOTE — Progress Notes (Signed)
PCCM Brief Interval Note  Asked to evaluate patient at bedside by ELink. At end of day shift, patient was initiated on BiPAP for hypoxia + tachypnea, refractory to [facility] and 6LNC.  Patient was evaluated and plan was developed with Dr. Carson Myrtle, PCCM  Patient endorses anxiety and pain, feels as though the BiPAP is aggravating her abdominal pain. At evaluation, patient HR 134, RR 34, BP 141/97 MAP 110 SpO2 92% on BiPAP 40% FiO2 PEEP 5 and Vt 400-550 ABG: 7.5/28.3/63   Acute respiratory distress with hypoxia  -acute post-op pain on chronic pain (possible component of opioid withdraw given home regimen)  -anxiety -likely component of atelectasis  P Discontinue BiPAP, titrate supplemental O2 for SpO2 >88%, can use HFNC if needed.  Precedex gtt Increase morphine IV dose (on high dose home opioids)  IS q2hr Turn/cough/deep breathe Teach patient how to splint abdomen with pillow during deep breathing    1:25 AM 4/15 Brief Interval Update  Re-evaluated patient after initiating precedex and giving morphine, ativan. Precedex increased, bronchodilator administered. No improvement. Patient with worsening tachypnea with accessory muscle recruitment. Intubation discussed with patient for acute respiratory failure, patient agreeable to intubation.  Discussed with Dr. Carson Myrtle.   Plan: Intubate patient ABG and CXR to follow Adjust FiO2/PEEP for SpO2 > 92% Sedation: morphine gtt, precedex gtt, PRN versed, PRN morphine  Pulm hygiene PRN bronchodilator     Additional Critical Care Time: 30 minutes   Eliseo Gum MSN, AGACNP-BC [place] [phone] If no answer, [phone] 05/01/2018, 1:28 AM

## 2018-05-01 NOTE — Procedures (Signed)
Endotracheal Intubation Procedure Note  Indication for endotracheal intubation: respiratory failure. Airway Assessment: Mallampati Class: IV (only hard palate visible). Sedation: etomidate and fentanyl. Paralytic: rocuronium. Lidocaine: no. Atropine: no. Equipment: GlideScope with Macintosh 3 laryngoscope blade. Cricoid Pressure: yes. Number of attempts: 2. ETT location confirmed by by auscultation, by CXR and ETCO2 monitor.   Renee Pain, MD Board Certified by the ABIM, [place] [facility] [place] Pager: [phone]   05/01/2018

## 2018-05-01 NOTE — Progress Notes (Signed)
Wasted 20m dilaudid into stericycle witnessed by JMartiniqueAllen, RN.  PCA discontinued- pt now intubated

## 2018-05-02 DIAGNOSIS — C259 Malignant neoplasm of pancreas, unspecified: Secondary | ICD-10-CM

## 2018-05-02 LAB — BPAM FFP
Blood Product Expiration Date: 202004182359
Blood Product Expiration Date: 202004182359
ISSUE DATE / TIME: 202004151117
ISSUE DATE / TIME: 202004151614
Unit Type and Rh: 6200
Unit Type and Rh: 6200

## 2018-05-02 LAB — COMPREHENSIVE METABOLIC PANEL
ALT: 160 U/L — ABNORMAL HIGH (ref 0–44)
AST: 48 U/L — ABNORMAL HIGH (ref 15–41)
Albumin: 2.8 g/dL — ABNORMAL LOW (ref 3.5–5.0)
Alkaline Phosphatase: 105 U/L (ref 38–126)
Anion gap: 11 (ref 5–15)
BUN: 14 mg/dL (ref 6–20)
CO2: 21 mmol/L — ABNORMAL LOW (ref 22–32)
Calcium: 7.8 mg/dL — ABNORMAL LOW (ref 8.9–10.3)
Chloride: 113 mmol/L — ABNORMAL HIGH (ref 98–111)
Creatinine, Ser: 0.85 mg/dL (ref 0.44–1.00)
GFR calc Af Amer: >60 mL/min (ref >60)
GFR calc non Af Amer: >60 mL/min (ref >60)
Glucose, Bld: 145 mg/dL — ABNORMAL HIGH (ref 70–99)
Potassium: 3.3 mmol/L — ABNORMAL LOW (ref 3.5–5.1)
Sodium: 145 mmol/L (ref 135–145)
Total Bilirubin: 2.3 mg/dL — ABNORMAL HIGH (ref 0.3–1.2)
Total Protein: 5.6 g/dL — ABNORMAL LOW (ref 6.5–8.1)

## 2018-05-02 LAB — CBC
HCT: 23.6 % — ABNORMAL LOW (ref 36.0–46.0)
Hemoglobin: 7.5 g/dL — ABNORMAL LOW (ref 12.0–15.0)
MCH: 24.4 pg — ABNORMAL LOW (ref 26.0–34.0)
MCHC: 31.8 g/dL (ref 30.0–36.0)
MCV: 76.6 fL — ABNORMAL LOW (ref 80.0–100.0)
Platelets: 95 10*3/uL — ABNORMAL LOW (ref 150–400)
RBC: 3.08 MIL/uL — ABNORMAL LOW (ref 3.87–5.11)
RDW: 21.5 % — ABNORMAL HIGH (ref 11.5–15.5)
WBC: 18.2 10*3/uL — ABNORMAL HIGH (ref 4.0–10.5)
nRBC: 0 % (ref 0.0–0.2)

## 2018-05-02 LAB — URINALYSIS, COMPLETE (UACMP) WITH MICROSCOPIC
Bacteria, UA: NONE SEEN
Bilirubin Urine: NEGATIVE
Glucose, UA: NEGATIVE mg/dL
Ketones, ur: NEGATIVE mg/dL
Nitrite: NEGATIVE
Protein, ur: 30 mg/dL — AB
Specific Gravity, Urine: 1.019 (ref 1.005–1.030)
pH: 6 (ref 5.0–8.0)

## 2018-05-02 LAB — PREPARE FRESH FROZEN PLASMA
Unit division: 0
Unit division: 0

## 2018-05-02 LAB — GLUCOSE, CAPILLARY
Glucose-Capillary: 109 mg/dL — ABNORMAL HIGH (ref 70–99)
Glucose-Capillary: 111 mg/dL — ABNORMAL HIGH (ref 70–99)
Glucose-Capillary: 114 mg/dL — ABNORMAL HIGH (ref 70–99)
Glucose-Capillary: 118 mg/dL — ABNORMAL HIGH (ref 70–99)
Glucose-Capillary: 128 mg/dL — ABNORMAL HIGH (ref 70–99)
Glucose-Capillary: 139 mg/dL — ABNORMAL HIGH (ref 70–99)
Glucose-Capillary: 99 mg/dL (ref 70–99)

## 2018-05-02 LAB — PROTIME-INR
INR: 1.7 — ABNORMAL HIGH (ref 0.8–1.2)
Prothrombin Time: 19.5 seconds — ABNORMAL HIGH (ref 11.4–15.2)

## 2018-05-02 LAB — PHOSPHORUS: Phosphorus: 2.4 mg/dL — ABNORMAL LOW (ref 2.5–4.6)

## 2018-05-02 MED ORDER — POTASSIUM CHLORIDE 20 MEQ/15ML (10%) PO SOLN
40.0000 meq | Freq: Once | ORAL | Status: AC
  Administered 2018-05-02: 11:00:00 40 meq via ORAL
  Filled 2018-05-02: qty 30

## 2018-05-02 MED ORDER — METHOCARBAMOL 1000 MG/10ML IJ SOLN
500.0000 mg | Freq: Four times a day (QID) | INTRAVENOUS | Status: DC
  Administered 2018-05-02 – 2018-05-11 (×29): 500 mg via INTRAVENOUS
  Filled 2018-05-02 (×22): qty 5
  Filled 2018-05-02: qty 500
  Filled 2018-05-02 (×20): qty 5
  Filled 2018-05-02: qty 500
  Filled 2018-05-02 (×3): qty 5
  Filled 2018-05-02 (×2): qty 500
  Filled 2018-05-02 (×14): qty 5
  Filled 2018-05-02: qty 500

## 2018-05-02 MED ORDER — METHOCARBAMOL 500 MG PO TABS
500.0000 mg | ORAL_TABLET | Freq: Four times a day (QID) | ORAL | Status: DC
  Administered 2018-05-03 – 2018-05-16 (×23): 500 mg via ORAL
  Filled 2018-05-02 (×26): qty 1

## 2018-05-02 MED ORDER — ENOXAPARIN SODIUM 40 MG/0.4ML ~~LOC~~ SOLN
40.0000 mg | SUBCUTANEOUS | Status: DC
  Administered 2018-05-02 – 2018-05-15 (×14): 40 mg via SUBCUTANEOUS
  Filled 2018-05-02 (×14): qty 0.4

## 2018-05-02 MED ORDER — FLUCONAZOLE IN SODIUM CHLORIDE 400-0.9 MG/200ML-% IV SOLN
800.0000 mg | Freq: Once | INTRAVENOUS | Status: AC
  Administered 2018-05-02: 800 mg via INTRAVENOUS
  Filled 2018-05-02 (×2): qty 400

## 2018-05-02 MED ORDER — POTASSIUM PHOSPHATES 15 MMOLE/5ML IV SOLN
20.0000 mmol | Freq: Once | INTRAVENOUS | Status: AC
  Administered 2018-05-02: 13:00:00 20 mmol via INTRAVENOUS
  Filled 2018-05-02: qty 6.67

## 2018-05-02 MED ORDER — PIPERACILLIN-TAZOBACTAM 3.375 G IVPB
3.3750 g | Freq: Three times a day (TID) | INTRAVENOUS | Status: DC
  Administered 2018-05-02 – 2018-05-16 (×43): 3.375 g via INTRAVENOUS
  Filled 2018-05-02 (×39): qty 50

## 2018-05-02 MED ORDER — SODIUM CHLORIDE 0.9% IV SOLUTION
Freq: Once | INTRAVENOUS | Status: AC
  Administered 2018-05-02: 08:00:00 via INTRAVENOUS

## 2018-05-02 MED ORDER — FLUCONAZOLE IN SODIUM CHLORIDE 400-0.9 MG/200ML-% IV SOLN: 400.0000 mg | INTRAVENOUS | Status: DC

## 2018-05-02 MED ORDER — IPRATROPIUM BROMIDE 0.02 % IN SOLN
0.5000 mg | Freq: Four times a day (QID) | RESPIRATORY_TRACT | Status: DC
  Administered 2018-05-02 – 2018-05-05 (×14): 0.5 mg via RESPIRATORY_TRACT
  Filled 2018-05-02 (×14): qty 2.5

## 2018-05-02 MED ORDER — CHLORHEXIDINE GLUCONATE CLOTH 2 % EX PADS
6.0000 | MEDICATED_PAD | Freq: Every day | CUTANEOUS | Status: DC
  Administered 2018-05-03 – 2018-05-11 (×10): 6 via TOPICAL

## 2018-05-02 MED ORDER — VANCOMYCIN HCL 500 MG IV SOLR
500.0000 mg | Freq: Two times a day (BID) | INTRAVENOUS | Status: DC
  Administered 2018-05-02 – 2018-05-04 (×5): 500 mg via INTRAVENOUS
  Filled 2018-05-02 (×8): qty 500

## 2018-05-02 MED ORDER — MORPHINE SULFATE 10 MG/5ML PO SOLN
15.0000 mg | Freq: Four times a day (QID) | ORAL | Status: DC
  Administered 2018-05-02 – 2018-05-03 (×4): 15 mg via ORAL
  Filled 2018-05-02 (×4): qty 8

## 2018-05-02 NOTE — Progress Notes (Signed)
Pharmacy Antibiotic Note  Jane Brooks is a 48 y.o. female admitted on 04/25/2018 with pancreatic cancer, now w/ recurrent fevers and reintubated.  Pharmacy has been consulted to broaden ABX w/ vancomycin, Zosyn, and fluconazole.  Plan: Vancomycin 564m IV Q12H. Goal AUC 400-550.  Expected AUC 510. SCr used 0.85. (last vanc 1g dose 4/15 10a) Zosyn 3.375g IV Q8H (4-hour infusion).  Fluconazole 8024mx1 followed by 40032mV Q24H.  Height: 4' 11"  (149.9 cm) Weight: 130 lb 1.1 oz (59 kg) IBW/kg (Calculated) : 43.2  Temp (24hrs), Avg:101 F (38.3 C), Min:97.5 F (36.4 C), Max:102.6 F (39.2 C)  Recent Labs  Lab 04/28/18 0353 04/28/18 2129 04/29/18 0030 04/29/18 0523 04/30/18 0547 05/01/18 0437 05/02/18 0447  WBC 18.6*  --   --  18.2* 19.5* 14.0* 18.2*  CREATININE 3.24*  --   --  1.71* 1.01* 0.96 0.85  LATICACIDVEN  --  1.2 1.2  --   --   --   --     Estimated Creatinine Clearance: 62.6 mL/min (by C-G formula based on SCr of 0.85 mg/dL).    Allergies  Allergen Reactions  . Latex Hives    Antimicrobials this admission: Ancef pre-op 4/9 Vanc 4/12 >> 4/15; 4/16 >>  Cefepime 4/12 >> 4/16  Microbiology results: 4/12 BCx x 2 >> ngtd 4/9 MRSA PCR negative  Thank you for allowing pharmacy to be a part of this patient's care.  VerWynona NeatharmD, BCPS  05/02/2018 7:23 AM

## 2018-05-02 NOTE — Anesthesia Post-op Follow-up Note (Signed)
  Anesthesia Pain Follow-up Note  Patient: Disaya Walt  Day #: 7  Date of Follow-up: 05/02/2018 Time: 5:47 PM  Last Vitals:  Vitals:   05/02/18 1502 05/02/18 1600  BP:  123/70  Pulse:  (!) 127  Resp:  18  Temp: 37.3 C   SpO2: 100% 100%    Level of Consciousness: sedated but arousable  Pain: Epidural has been turned off for 2 days. Pain managed with morphine PCA.   Side Effects:None  Catheter Site Exam:clean, dry, no drainage  Anti-Coag Meds (From admission, onward)   Start     Dose/Rate Route Frequency Ordered Stop   05/02/18 2200  enoxaparin (LOVENOX) injection 40 mg     40 mg Subcutaneous Every 24 hours 05/02/18 1505         Plan: Despite the infusion being discontinued, the epidural catheter has not been removed secondary to the patient's coagulopathy. Platelets have increased to 95 today, and pt has received 2U FFP. The catheter has been in place for 7 day. At this point, the risk of infection outweighs the risk of bleeding, without clear benefit. Will plan to remove epidural catheter today. Plan to monitor closely for bleeding or neurological changes.   [place]

## 2018-05-02 NOTE — Progress Notes (Addendum)
NAME:  Jane Brooks, MRN:  [id number], DOB:  08/29/1970, LOS: 7 ADMISSION DATE:  04/25/2018, CONSULTATION DATE:  04/25/18 REFERRING MD:  Barry Dienes, MD CHIEF COMPLAINT:  Acute hypoxemic respiratory failure  Brief History   48 year old female with pancreatic adenocarcinoma s/p chemotherapy admitted for Whipple on 4/9. PCCM consulted for post-op hypotension, AKI and shock liver. Extubated on 4/10 however developed respiratory distress secondary to HCAP and was reintubated on 4/15.  Significant Hospital Events   4/9 Laparotomy 4/15 Re-intubated  Consults:  PCCM  Procedures:  ETT 4/9>4/10 ETT 4/15  Significant Diagnostic Tests:  CT A/P 4/3  Panceratic mass with duct dilation CTA 4/9 No PE, s/p pancreas head mass removal, s/p pancreatic stent  Micro Data:  BCX 4/12 > BCX 4/16>  Antimicrobials:  Cefazolin 4/9 once Cefepime 4/12> Vanc 4/12>  Interim history/subjective:  Re-intubated yesterday for hypoxemic respiratory failure secondary to pneumonia  Objective   Blood pressure 121/68, pulse (!) 135, temperature (S) (!) 102.6 F (39.2 C), temperature source Axillary, resp. rate (!) 23, height 4' 11"  (1.499 m), weight 59 kg, SpO2 100 %.    Vent Mode: PSV;CPAP FiO2 (%):  [40 %] 40 % Set Rate:  [18 bmp] 18 bmp Vt Set:  [450 mL] 450 mL PEEP:  [5 cmH20] 5 cmH20 Pressure Support:  [10 cmH20] 10 cmH20 Plateau Pressure:  [19 cmH20-24 cmH20] 19 cmH20   Intake/Output Summary (Last 24 hours) at 05/02/2018 0819 Last data filed at 05/02/2018 0700 Gross per 24 hour  Intake 3097.89 ml  Output 2362 ml  Net 735.89 ml   Filed Weights   04/30/18 0500 05/01/18 0200 05/02/18 0400  Weight: 60.2 kg 58.6 kg 59 kg    Physical Exam: General: Chronically ill-appearing, sedated HENT: [place], AT, ETT in place Respiratory: Decreased air movement, mild diffuse wheezing Cardiovascular: RRR, -M/R/G, no JVD GI: BS+, soft, nontender Extremities:-Edema,-tenderness Neuro: Sedated, awakens to voice GU:  Foley in place  CXR 4/16 Multifocal pneumonia   Resolved Hospital Problem list   AKI Hypotension  Assessment & Plan:   Acute hypoxemic respiratory failure  Reintubated on 4/15. Presumed hospital acquire pneumonia Plan: Full vent support. SBT as tolerated Obtain trach aspirate Follow-up BCX Continue broad-spectrum antibiotics DC fluconazole  Pancreatic head cancer s/p Whipple Management per surgery Epidural in place for pain management. Plan to remove when INR <1.5 Plan to resume DVT ppx asap Morphine gtt. Start enteral morphine to facilitate weaning  Hypokalemia Hypophosphatemia Plan: Replete  Shock Liver Improving. Elevated INR s/p FFP Plan: Trend INR  Best practice:  Diet: TF Pain/Anxiety/Delirium protocol (if indicated): Yes VAP protocol (if indicated): Yes DVT prophylaxis: Will need lovenox ppx once epidural is removed GI prophylaxis: PPI Glucose control: SSI Mobility: BR Code Status: Full Family Communication: No family at bedside Disposition: ICU  Labs   CBC: Recent Labs  Lab 04/25/18 1800  04/28/18 0353  04/29/18 0523 04/30/18 0547 04/30/18 1958 05/01/18 0211 05/01/18 0437 05/02/18 0447  WBC 23.6*   < > 18.6*  --  18.2* 19.5*  --   --  14.0* 18.2*  NEUTROABS 21.1*  --   --   --   --   --   --   --   --   --   HGB 10.9*   < > 8.3*   < > 8.6* 8.3* 8.8* 8.8* 7.8* 7.5*  HCT 33.9*   < > 25.5*   < > 26.7* 26.6* 26.0* 26.0* 24.8* 23.6*  MCV 70.2*   < > 73.9*  --  76.7* 76.7*  --   --  75.8* 76.6*  PLT 170   < > 61*  --  62* 74*  --   --  73* 95*   < > = values in this interval not displayed.    Basic Metabolic Panel: Recent Labs  Lab 04/26/18 0513  04/27/18 0444  04/28/18 0353  04/29/18 0523 04/30/18 0547 04/30/18 1958 05/01/18 0211 05/01/18 0437 05/02/18 0447  NA 141   < > 143   < > 146*   < > 152* 147* 143 143 142 145  K 4.9   < > 3.9   < > 3.4*   < > 3.1* 3.1* 2.7* 3.1* 3.3* 3.3*  CL 100   < > 99   < > 109  --  115* 112*  --   --   110 113*  CO2 19*   < > 28   < > 24  --  26 24  --   --  22 21*  GLUCOSE 127*   < > 101*   < > 120*  --  143* 119*  --   --  158* 145*  BUN 19   < > 49*   < > 53*  --  36* 20  --   --  16 14  CREATININE 1.93*   < > 3.85*   < > 3.24*  --  1.71* 1.01*  --   --  0.96 0.85  CALCIUM 8.1*   < > 8.5*   < > 8.2*  --  8.0* 7.9*  --   --  7.2* 7.8*  MG 1.5*  --  2.3  --  2.0  --  2.0 1.7  --   --   --   --   PHOS 7.5*  --  4.1  --  4.4  --  4.1 2.2*  --   --  3.2 2.4*   < > = values in this interval not displayed.   GFR: Estimated Creatinine Clearance: 62.6 mL/min (by C-G formula based on SCr of 0.85 mg/dL). Recent Labs  Lab 04/28/18 2129 04/29/18 0030 04/29/18 0523 04/30/18 0547 05/01/18 0437 05/02/18 0447  WBC  --   --  18.2* 19.5* 14.0* 18.2*  LATICACIDVEN 1.2 1.2  --   --   --   --     Liver Function Tests: Recent Labs  Lab 04/28/18 0353 04/29/18 0523 04/30/18 0547 05/01/18 0437 05/02/18 0447  AST 1,670* 466* 137* 64* 48*  ALT 1,112* 795* 464* 265* 160*  ALKPHOS 185* 165* 128* 111 105  BILITOT 2.2* 2.7* 2.6* 2.1* 2.3*  PROT 5.5* 5.7* 5.5* 4.7* 5.6*  ALBUMIN 4.0 3.4* 2.9* 2.4* 2.8*   No results for input(s): LIPASE, AMYLASE in the last 168 hours. No results for input(s): AMMONIA in the last 168 hours.  ABG    Component Value Date/Time   PHART 7.397 05/01/2018 0211   PCO2ART 41.7 05/01/2018 0211   PO2ART 217.0 (H) 05/01/2018 0211   HCO3 25.7 05/01/2018 0211   TCO2 27 05/01/2018 0211   ACIDBASEDEF 10.7 (H) 04/25/2018 2119   O2SAT 100.0 05/01/2018 0211     Coagulation Profile: Recent Labs  Lab 04/25/18 1911 04/26/18 0513 04/27/18 0444 05/01/18 0437 05/02/18 0447  INR 1.4* 1.8* 1.8* 1.7* 1.7*    Cardiac Enzymes: No results for input(s): CKTOTAL, CKMB, CKMBINDEX, TROPONINI in the last 168 hours.  HbA1C: No results found for: HGBA1C  CBG: Recent Labs  Lab 05/01/18 1510 05/01/18 2019 05/01/18  2358 05/02/18 0455 05/02/18 0729  GLUCAP 125* 138* 114* 118*  139*    Critical care time: 35 min    The patient is critically ill with multiple organ systems failure and requires high complexity decision making for assessment and support, frequent evaluation and titration of therapies, application of advanced monitoring technologies and extensive interpretation of multiple databases.   Critical Care Time devoted to patient care services described in this note is 35 Minutes. This time reflects time of care of this signee Dr. Rodman Pickle. This critical care time does not reflect procedure time, or teaching time or supervisory time of PA/NP/Med student/Med Resident etc but could involve care discussion time.  Rodman Pickle, M.D. [facility] Pulmonary/Critical Care Medicine Pager: [phone] After hours pager: [phone]

## 2018-05-02 NOTE — Plan of Care (Signed)
  Problem: Nutrition: Goal: Adequate nutrition will be maintained Outcome: Progressing   

## 2018-05-02 NOTE — Progress Notes (Signed)
7 Days Post-Op   Subjective/Chief Complaint: Had fevers yesterday and last night.  Got FFP, but already had fever.     Objective: Vital signs in last 24 hours: Temp:  [97.5 F (36.4 C)-102.6 F (39.2 C)] 102.6 F (39.2 C) (04/16 0430) Pulse Rate:  [109-145] 136 (04/16 0700) Resp:  [22-36] 25 (04/16 0700) BP: (92-148)/(48-84) 141/73 (04/16 0700) SpO2:  [97 %-100 %] 100 % (04/16 0700) FiO2 (%):  [40 %-50 %] 40 % (04/16 0700) Weight:  [59 kg] 59 kg (04/16 0400) Last BM Date: 04/26/18  Intake/Output from previous day: 04/15 0701 - 04/16 0700 In: 3216.9 [I.V.:1358.1; Blood:451.7; NG/GT:1008.3; IV Piggyback:398.8] Out: 2487 [Urine:1462; Emesis/NG output:125; Drains:725; Stool:175] Intake/Output this shift: No intake/output data recorded.   General appearance: sedated, ventilated.   Resp: breathing comfortably.  Coarse BS R>L Cardio: regular, tachycardic GI:  Soft, minimally distended, serosang drain output, less over 24 hours.  Less anasarca in abdominal wall.  Diuresing still via drains.   Extremities: extremities normal; no cyanosis or edema Pulses: palpable  Lab Results:  Recent Labs    05/01/18 0437 05/02/18 0447  WBC 14.0* 18.2*  HGB 7.8* 7.5*  HCT 24.8* 23.6*  PLT 73* 95*   BMET Recent Labs    05/01/18 0437 05/02/18 0447  NA 142 145  K 3.3* 3.3*  CL 110 113*  CO2 22 21*  GLUCOSE 158* 145*  BUN 16 14  CREATININE 0.96 0.85  CALCIUM 7.2* 7.8*   PT/INR Recent Labs    05/01/18 0437 05/02/18 0447  LABPROT 20.1* 19.5*  INR 1.7* 1.7*   ABG Recent Labs    04/30/18 1958 05/01/18 0211  PHART 7.507* 7.397  HCO3 22.4 25.7    Studies/Results: Dg Chest Port 1 View  Result Date: 05/01/2018 CLINICAL DATA:  Acute respiratory failure with hypoxemia. Pulmonary infiltrates. EXAM: PORTABLE CHEST 1 VIEW 10:28 a.m. COMPARISON:  Chest x-ray dated 05/01/2018 at 2:30 a.m. and 04/30/2018 and 04/28/2018 FINDINGS: Endotracheal tube is in good position 4 cm above the  carina. Power port tip is in superior vena cava just above the cavoatrial junction in good position. There has been partial clearing of the bilateral pulmonary infiltrates. No visible residual effusions. No pneumothorax. Acute bone abnormality. Surgical drains in the upper abdomen. IMPRESSION: Improved bilateral pulmonary infiltrates. Electronically Signed   By: Lorriane Shire M.D.   On: 05/01/2018 11:12   Portable Chest X-ray  Result Date: 05/01/2018 CLINICAL DATA:  Check endotracheal tube placement EXAM: PORTABLE CHEST 1 VIEW COMPARISON:  Film from earlier in the same day. FINDINGS: Cardiac shadow is stable. Endotracheal tube is noted 1 cm above the carina. Right chest wall port is again seen and stable. Patchy infiltrates are seen bilaterally similar to that noted on the prior exam. No new focal abnormality is noted. IMPRESSION: Endotracheal tube as described. Stable bilateral infiltrates. Electronically Signed   By: Inez Catalina M.D.   On: 05/01/2018 02:12   Dg Chest Port 1 View  Result Date: 04/30/2018 CLINICAL DATA:  Tachypnea.  Status post Whipple procedure. EXAM: PORTABLE CHEST 1 VIEW COMPARISON:  Earlier today at 0500 hours. FINDINGS: Right Port-A-Cath tip at low SVC. Moderate right hemidiaphragm elevation. Normal heart size. Small right pleural effusion is improved to resolved. No pneumothorax. Minimally improved aeration with bilateral airspace opacities remaining. Remote left rib fractures. IMPRESSION: Minimally improved multifocal airspace disease, favoring pneumonia. Improved to resolved right pleural effusion. Electronically Signed   By: Abigail Miyamoto M.D.   On: 04/30/2018 21:02   Dg  Abd Portable 1v  Result Date: 05/01/2018 CLINICAL DATA:  Orogastric tube placement EXAM: PORTABLE ABDOMEN - 1 VIEW COMPARISON:  None. FINDINGS: Tip and side port of the orogastric tube project over the gastric body/fundus. Multiple surgical drains are in unchanged position. Nonspecific bowel gas pattern.  IMPRESSION: OG tube tip and side port project over the gastric body/fundus. Electronically Signed   By: Ulyses Jarred M.D.   On: 05/01/2018 14:06    Anti-infectives: Anti-infectives (From admission, onward)   Start     Dose/Rate Route Frequency Ordered Stop   04/30/18 2230  vancomycin (VANCOCIN) IVPB 750 mg/150 ml premix  Status:  Discontinued     750 mg 150 mL/hr over 60 Minutes Intravenous Every 48 hours 04/28/18 2133 04/29/18 1121   04/30/18 2230  vancomycin (VANCOCIN) IVPB 1000 mg/200 mL premix  Status:  Discontinued     1,000 mg 200 mL/hr over 60 Minutes Intravenous Every 48 hours 04/29/18 1121 04/30/18 0904   04/30/18 1000  vancomycin (VANCOCIN) IVPB 1000 mg/200 mL premix     1,000 mg 200 mL/hr over 60 Minutes Intravenous Every 24 hours 04/30/18 0904 05/01/18 1011   04/30/18 0915  ceFEPIme (MAXIPIME) 2 g in sodium chloride 0.9 % 100 mL IVPB     2 g 200 mL/hr over 30 Minutes Intravenous Every 12 hours 04/30/18 0905     04/28/18 2130  ceFEPIme (MAXIPIME) 2 g in sodium chloride 0.9 % 100 mL IVPB  Status:  Discontinued     2 g 200 mL/hr over 30 Minutes Intravenous Every 24 hours 04/28/18 2113 04/30/18 0905   04/28/18 2130  vancomycin (VANCOCIN) 1,250 mg in sodium chloride 0.9 % 250 mL IVPB     1,250 mg 166.7 mL/hr over 90 Minutes Intravenous  Once 04/28/18 2129 04/29/18 0107   04/25/18 1530  ceFAZolin (ANCEF) IVPB 2g/100 mL premix     2 g 200 mL/hr over 30 Minutes Intravenous Every 8 hours 04/25/18 1520 04/25/18 1739   04/25/18 0600  ceFAZolin (ANCEF) IVPB 2g/100 mL premix     2 g 200 mL/hr over 30 Minutes Intravenous On call to O.R. 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 1 LN positive and vein margin positive.       Neuro- on morphine gtt and PRN versed for sedation/analgesia.  Plan epidural removal once INR 1.5 or lower.  Giving FFP again today.  Will need platelets day of removal if plt count  below 100K.    Pulm/ID- reintubated for respiratory distress and increased WOB.   Cefepime for HCAP.  Switch to zosyn/vanc/fluconazole for coverage of anaerobes and MRSA.  Drain lipase pending.  Get u/a and blood cultures today.  CV- tachycardia increased a bit with fever..  Pt young.  Appears reactive in nature. Staying off pressors.    GI-  Drains as expected, LFTs continue to trend down. Duplex showed patent portal and hepatic veins.  Tube feeds at goal.    Renal- cr now back to baseline.  Has diuresed quite a bit.  Stable I&O since admit at this point.    FEN:  Replete potassium. - hypokalemia.  Hypernatremia - resolved.  Hypophosphatemia - resolved.  Endocrine- hyperglycemia, continue ssi, glucose under reasonable control. At risk of developing diabetes given amount of pancreas removed.  Will see how blood sugars settle out once acute stress improved.    Heme:  ABL anemia on anemia of chronic dx, anemia of  chemotherapy.    VTE ppx- SCDs, no SQ lovenox at this point given platelet count.     CCM managing vent/sedation/HCAP   Global:  Unsurprised about reintubation.  Was tiring out.  WBCs were coming down, but back up with fevers.  Culturing and switching antibiotics.     Stark Klein 05/02/2018

## 2018-05-02 NOTE — Progress Notes (Addendum)
This RN wasted 62m/0.5mL of morphine with MCandy Sledge RN

## 2018-05-03 ENCOUNTER — Inpatient Hospital Stay (HOSPITAL_COMMUNITY): Payer: Medicaid Other

## 2018-05-03 LAB — BPAM FFP
Blood Product Expiration Date: 202004202359
Blood Product Expiration Date: 202004202359
ISSUE DATE / TIME: 202004160817
ISSUE DATE / TIME: 202004160817
Unit Type and Rh: 6200
Unit Type and Rh: 6200

## 2018-05-03 LAB — CBC
HCT: 22 % — ABNORMAL LOW (ref 36.0–46.0)
HCT: 26.4 % — ABNORMAL LOW (ref 36.0–46.0)
Hemoglobin: 6.5 g/dL — CL (ref 12.0–15.0)
Hemoglobin: 8 g/dL — ABNORMAL LOW (ref 12.0–15.0)
MCH: 23.7 pg — ABNORMAL LOW (ref 26.0–34.0)
MCH: 24.5 pg — ABNORMAL LOW (ref 26.0–34.0)
MCHC: 29.5 g/dL — ABNORMAL LOW (ref 30.0–36.0)
MCHC: 30.3 g/dL (ref 30.0–36.0)
MCV: 80.3 fL (ref 80.0–100.0)
MCV: 81 fL (ref 80.0–100.0)
Platelets: 98 10*3/uL — ABNORMAL LOW (ref 150–400)
Platelets: 145 10*3/uL — ABNORMAL LOW (ref 150–400)
RBC: 2.74 MIL/uL — ABNORMAL LOW (ref 3.87–5.11)
RBC: 3.26 MIL/uL — ABNORMAL LOW (ref 3.87–5.11)
RDW: 22.9 % — ABNORMAL HIGH (ref 11.5–15.5)
RDW: 21.6 % — ABNORMAL HIGH (ref 11.5–15.5)
WBC: 18.6 10*3/uL — ABNORMAL HIGH (ref 4.0–10.5)
WBC: 22.3 10*3/uL — ABNORMAL HIGH (ref 4.0–10.5)
nRBC: 0 % (ref 0.0–0.2)
nRBC: 0 % (ref 0.0–0.2)

## 2018-05-03 LAB — COMPREHENSIVE METABOLIC PANEL
ALT: 96 U/L — ABNORMAL HIGH (ref 0–44)
AST: 45 U/L — ABNORMAL HIGH (ref 15–41)
Albumin: 2.4 g/dL — ABNORMAL LOW (ref 3.5–5.0)
Alkaline Phosphatase: 89 U/L (ref 38–126)
Anion gap: 10 (ref 5–15)
BUN: 16 mg/dL (ref 6–20)
CO2: 21 mmol/L — ABNORMAL LOW (ref 22–32)
Calcium: 7.6 mg/dL — ABNORMAL LOW (ref 8.9–10.3)
Chloride: 115 mmol/L — ABNORMAL HIGH (ref 98–111)
Creatinine, Ser: 1.03 mg/dL — ABNORMAL HIGH (ref 0.44–1.00)
GFR calc Af Amer: >60 mL/min (ref >60)
GFR calc non Af Amer: >60 mL/min (ref >60)
Glucose, Bld: 142 mg/dL — ABNORMAL HIGH (ref 70–99)
Potassium: 4.8 mmol/L (ref 3.5–5.1)
Sodium: 146 mmol/L — ABNORMAL HIGH (ref 135–145)
Total Bilirubin: 2.2 mg/dL — ABNORMAL HIGH (ref 0.3–1.2)
Total Protein: 5.8 g/dL — ABNORMAL LOW (ref 6.5–8.1)

## 2018-05-03 LAB — PROTIME-INR
INR: 1.7 — ABNORMAL HIGH (ref 0.8–1.2)
Prothrombin Time: 19.4 seconds — ABNORMAL HIGH (ref 11.4–15.2)

## 2018-05-03 LAB — PREPARE FRESH FROZEN PLASMA
Unit division: 0
Unit division: 0

## 2018-05-03 LAB — GLUCOSE, CAPILLARY
Glucose-Capillary: 73 mg/dL (ref 70–99)
Glucose-Capillary: 121 mg/dL — ABNORMAL HIGH (ref 70–99)
Glucose-Capillary: 127 mg/dL — ABNORMAL HIGH (ref 70–99)
Glucose-Capillary: 120 mg/dL — ABNORMAL HIGH (ref 70–99)
Glucose-Capillary: 132 mg/dL — ABNORMAL HIGH (ref 70–99)
Glucose-Capillary: 113 mg/dL — ABNORMAL HIGH (ref 70–99)

## 2018-05-03 LAB — PREPARE RBC (CROSSMATCH)

## 2018-05-03 MED ORDER — MORPHINE SULFATE (CONCENTRATE) 10 MG/0.5ML PO SOLN
15.0000 mg | Freq: Four times a day (QID) | ORAL | Status: DC
  Administered 2018-05-03 – 2018-05-12 (×36): 15 mg
  Filled 2018-05-03 (×36): qty 1

## 2018-05-03 MED ORDER — FUROSEMIDE 10 MG/ML IJ SOLN
20.0000 mg | Freq: Once | INTRAMUSCULAR | Status: AC
  Administered 2018-05-03: 16:00:00 20 mg via INTRAVENOUS
  Filled 2018-05-03: qty 2

## 2018-05-03 MED ORDER — GADOBUTROL 1 MMOL/ML IV SOLN
7.0000 mL | Freq: Once | INTRAVENOUS | Status: AC | PRN
  Administered 2018-05-03: 7 mL via INTRAVENOUS

## 2018-05-03 MED ORDER — PANCRELIPASE (LIP-PROT-AMYL) 12000-38000 UNITS PO CPEP
36000.0000 [IU] | ORAL_CAPSULE | Freq: Three times a day (TID) | ORAL | Status: DC
  Administered 2018-05-03 – 2018-05-16 (×39): 36000 [IU] via ORAL
  Filled 2018-05-03 (×13): qty 1
  Filled 2018-05-03: qty 3
  Filled 2018-05-03 (×7): qty 1
  Filled 2018-05-03 (×3): qty 3
  Filled 2018-05-03: qty 1
  Filled 2018-05-03: qty 3
  Filled 2018-05-03 (×6): qty 1
  Filled 2018-05-03: qty 3
  Filled 2018-05-03: qty 1
  Filled 2018-05-03: qty 3
  Filled 2018-05-03 (×5): qty 1

## 2018-05-03 MED ORDER — SODIUM CHLORIDE 0.9% IV SOLUTION
Freq: Once | INTRAVENOUS | Status: AC
  Administered 2018-05-03: 11:00:00 via INTRAVENOUS

## 2018-05-03 NOTE — Progress Notes (Signed)
8 Days Post-Op   Subjective/Chief Complaint:  Epidural pulled out.  Started on enteral morphine.  Still febrile.  Stools are very foul.     Objective: Vital signs in last 24 hours: Temp:  [99.1 F (37.3 C)-102.8 F (39.3 C)] 101.3 F (38.5 C) (04/17 0905) Pulse Rate:  [124-139] 129 (04/17 0900) Resp:  [17-29] 19 (04/17 0900) BP: (83-143)/(55-86) 125/59 (04/17 0900) SpO2:  [98 %-100 %] 100 % (04/17 0910) FiO2 (%):  [40 %] 40 % (04/17 0910) Weight:  [62.1 kg] 62.1 kg (04/17 0500) Last BM Date: 04/26/18  Intake/Output from previous day: 04/16 0701 - 04/17 0700 In: 4999.7 [I.V.:1621.6; Blood:851.7; NG/GT:990; IV Piggyback:1436.4] Out: 2845 [Urine:1675; Emesis/NG output:450; Drains:620; Stool:100] Intake/Output this shift: No intake/output data recorded.   General appearance: less responsive Resp: breathing comfortably.   Cardio: regular, tachycardic GI:  Soft, minimally distended, serosang drain output, continues to come down.  Extremities: extremities normal; no cyanosis or edema Pulses: palpable  Lab Results:  Recent Labs    05/02/18 0447 05/03/18 0548  WBC 18.2* 18.6*  HGB 7.5* 6.5*  HCT 23.6* 22.0*  PLT 95* 98*   BMET Recent Labs    05/02/18 0447 05/03/18 0548  NA 145 146*  K 3.3* 4.8  CL 113* 115*  CO2 21* 21*  GLUCOSE 145* 142*  BUN 14 16  CREATININE 0.85 1.03*  CALCIUM 7.8* 7.6*   PT/INR Recent Labs    05/02/18 0447 05/03/18 0548  LABPROT 19.5* 19.4*  INR 1.7* 1.7*   ABG Recent Labs    04/30/18 1958 05/01/18 0211  PHART 7.507* 7.397  HCO3 22.4 25.7    Studies/Results: Dg Chest Port 1 View  Result Date: 05/01/2018 CLINICAL DATA:  Acute respiratory failure with hypoxemia. Pulmonary infiltrates. EXAM: PORTABLE CHEST 1 VIEW 10:28 a.m. COMPARISON:  Chest x-ray dated 05/01/2018 at 2:30 a.m. and 04/30/2018 and 04/28/2018 FINDINGS: Endotracheal tube is in good position 4 cm above the carina. Power port tip is in superior vena cava just above  the cavoatrial junction in good position. There has been partial clearing of the bilateral pulmonary infiltrates. No visible residual effusions. No pneumothorax. Acute bone abnormality. Surgical drains in the upper abdomen. IMPRESSION: Improved bilateral pulmonary infiltrates. Electronically Signed   By: Lorriane Shire M.D.   On: 05/01/2018 11:12   Dg Abd Portable 1v  Result Date: 05/01/2018 CLINICAL DATA:  Orogastric tube placement EXAM: PORTABLE ABDOMEN - 1 VIEW COMPARISON:  None. FINDINGS: Tip and side port of the orogastric tube project over the gastric body/fundus. Multiple surgical drains are in unchanged position. Nonspecific bowel gas pattern. IMPRESSION: OG tube tip and side port project over the gastric body/fundus. Electronically Signed   By: Ulyses Jarred M.D.   On: 05/01/2018 14:06    Anti-infectives: Anti-infectives (From admission, onward)   Start     Dose/Rate Route Frequency Ordered Stop   05/03/18 1000  fluconazole (DIFLUCAN) IVPB 400 mg  Status:  Discontinued     400 mg 100 mL/hr over 120 Minutes Intravenous Every 24 hours 05/02/18 0721 05/02/18 0855   05/02/18 0800  vancomycin (VANCOCIN) 500 mg in sodium chloride 0.9 % 100 mL IVPB     500 mg 100 mL/hr over 60 Minutes Intravenous Every 12 hours 05/02/18 0721     05/02/18 0800  piperacillin-tazobactam (ZOSYN) IVPB 3.375 g     3.375 g 12.5 mL/hr over 240 Minutes Intravenous Every 8 hours 05/02/18 0721     05/02/18 0730  fluconazole (DIFLUCAN) IVPB 800 mg  800 mg 200 mL/hr over 120 Minutes Intravenous  Once 05/02/18 0721 05/02/18 0952   04/30/18 2230  vancomycin (VANCOCIN) IVPB 750 mg/150 ml premix  Status:  Discontinued     750 mg 150 mL/hr over 60 Minutes Intravenous Every 48 hours 04/28/18 2133 04/29/18 1121   04/30/18 2230  vancomycin (VANCOCIN) IVPB 1000 mg/200 mL premix  Status:  Discontinued     1,000 mg 200 mL/hr over 60 Minutes Intravenous Every 48 hours 04/29/18 1121 04/30/18 0904   04/30/18 1000  vancomycin  (VANCOCIN) IVPB 1000 mg/200 mL premix     1,000 mg 200 mL/hr over 60 Minutes Intravenous Every 24 hours 04/30/18 0904 05/01/18 1011   04/30/18 0915  ceFEPIme (MAXIPIME) 2 g in sodium chloride 0.9 % 100 mL IVPB  Status:  Discontinued     2 g 200 mL/hr over 30 Minutes Intravenous Every 12 hours 04/30/18 0905 05/02/18 0721   04/28/18 2130  ceFEPIme (MAXIPIME) 2 g in sodium chloride 0.9 % 100 mL IVPB  Status:  Discontinued     2 g 200 mL/hr over 30 Minutes Intravenous Every 24 hours 04/28/18 2113 04/30/18 0905   04/28/18 2130  vancomycin (VANCOCIN) 1,250 mg in sodium chloride 0.9 % 250 mL IVPB     1,250 mg 166.7 mL/hr over 90 Minutes Intravenous  Once 04/28/18 2129 04/29/18 0107   04/25/18 1530  ceFAZolin (ANCEF) IVPB 2g/100 mL premix     2 g 200 mL/hr over 30 Minutes Intravenous Every 8 hours 04/25/18 1520 04/25/18 1739   04/25/18 0600  ceFAZolin (ANCEF) IVPB 2g/100 mL premix     2 g 200 mL/hr over 30 Minutes Intravenous On call to O.R. 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 1 LN positive and vein margin positive.       Neuro- on morphine gtt and PRN versed for sedation/analgesia.  Epidural out.  On enteral morphine and decreasing gtt.      Pulm/ID- reintubated for respiratory distress and increased WOB.  Zosyn/vanc should cover respiratory or GI pathogens.   Drain lipase pending.  Cx/u/a negative to date.  CTs today.    CV- tachycardia increased a bit with fever..  Pt young.  Appears reactive in nature. Staying off pressors.    GI-  Drains as expected, LFTs continue to trend down except t bili which is stable.   Duplex showed patent portal and hepatic veins.  Tube feeds at goal.    Renal- cr now back to baseline.  Has diuresed quite a bit.  Stable I&O since admit at this point.  Will avoid contrast.    FEN: hypokalemia resolved.  Hypernatremia - stable.  Hypophosphatemia - resolved.  Endocrine-  hyperglycemia, continue ssi, glucose under reasonable control. At risk of developing diabetes given amount of pancreas removed.  Will see how blood sugars settle out once acute stress improved.    Heme:  ABL anemia on anemia of chronic dx, anemia of chemotherapy.  Drifted down again today.    VTE ppx- SCDs, ok to start sq lovenox today as plt count recovered and epidural is out.    CCM managing vent/sedation/HCAP   Global:  Unsurprised about reintubation.  Was tiring out.  WBCs were coming down, but back up with fevers.  Cultures pending.  Will get scans today.   Stark Klein 05/03/2018

## 2018-05-03 NOTE — Progress Notes (Signed)
Patient transported to CT and back to 2W58 without complications. RN at bedside.

## 2018-05-03 NOTE — Progress Notes (Signed)
Pt transported to MRI without complications on vent with current setting and 100%FiO2.  RT and RN remained with pt until scan was completed. Pt was then transported to 5T01 without complications. Pt remains on prior vent settings. Unit RT notified.

## 2018-05-03 NOTE — Progress Notes (Signed)
NAME:  Anaika Santillano, MRN:  [id number], DOB:  1970/07/16, LOS: 8 ADMISSION DATE:  04/25/2018, CONSULTATION DATE:  04/25/18 REFERRING MD:  Barry Dienes, MD CHIEF COMPLAINT:  Acute hypoxemic respiratory failure  Brief History   48 year old female with pancreatic adenocarcinoma s/p chemotherapy admitted for Whipple on 4/9. PCCM consulted for post-op hypotension, AKI and shock liver. Extubated on 4/10 however developed respiratory distress secondary to HCAP and was reintubated on 4/15.  Significant Hospital Events   4/9 Laparotomy 4/15 Re-intubated  Consults:  PCCM  Procedures:  ETT 4/9>4/10 ETT 4/15  Significant Diagnostic Tests:  CT A/P 4/3  Panceratic mass with duct dilation CTA 4/9 No PE, s/p pancreas head mass removal, s/p pancreatic stent  Micro Data:  BCX 4/12 >no growth; trach aspirate unremarkable  BCX 4/16> pending  Antimicrobials:  Cefazolin 4/9 once Cefepime 4/12> Vanc 4/12>  Interim history/subjective:  Re-intubated 4/15 for hypoxemic respiratory failure secondary to pneumonia  Objective   Blood pressure (!) 125/59, pulse (!) 129, temperature (!) 101.3 F (38.5 C), resp. rate 19, height 4' 11"  (1.499 m), weight 62.1 kg, SpO2 100 %.    Vent Mode: PSV;CPAP FiO2 (%):  [40 %] 40 % Set Rate:  [18 bmp] 18 bmp Vt Set:  [450 mL] 450 mL PEEP:  [5 cmH20] 5 cmH20 Pressure Support:  [12 cmH20] 12 cmH20 Plateau Pressure:  [16 cmH20-24 cmH20] 23 cmH20   Intake/Output Summary (Last 24 hours) at 05/03/2018 0930 Last data filed at 05/03/2018 0600 Gross per 24 hour  Intake 4914.45 ml  Output 2845 ml  Net 2069.45 ml   Filed Weights   05/01/18 0200 05/02/18 0400 05/03/18 0500  Weight: 58.6 kg 59 kg 62.1 kg    Physical Exam: General: Chronically ill-appearing, sedated HENT: [place], AT, ETT in place Respiratory: Decreased air movement, mild diffuse wheezing Cardiovascular: RRR, -M/R/G, no JVD GI: BS+, soft, nontender Extremities:-Edema,-tenderness Neuro: Sedated, awakens to  voice GU: Foley in place  CXR 4/16 Multifocal pneumonia   Resolved Hospital Problem list   AKI Hypotension  Assessment & Plan:   Acute hypoxemic respiratory failure  Reintubated on 4/15. Presumed hospital acquire pneumonia Plan: Full vent support. SBT as tolerated Follow-up BCX Continue broad-spectrum antibiotics Fluconazole restarted Per Dr. Barry Dienes to CT today for chest, abd, pelvis non-contrasted study to eval for other source of fever,etc.  Pancreatic head cancer s/p Whipple Management per surgery Epidural removed Plan to resume DVT ppx asap Morphine gtt. Start enteral morphine to facilitate weaning  Hypokalemia Hypophosphatemia Plan: Replete  Shock Liver Improving. Elevated INR s/p FFP Plan: Trend INR  Best practice:  Diet: TF Pain/Anxiety/Delirium protocol (if indicated): Yes VAP protocol (if indicated): Yes DVT prophylaxis: Will need lovenox ppx once epidural is removed GI prophylaxis: PPI Glucose control: SSI Mobility: BR Code Status: Full Family Communication: No family at bedside Disposition: ICU  Labs   CBC: Recent Labs  Lab 04/29/18 0523 04/30/18 0547 04/30/18 1958 05/01/18 0211 05/01/18 0437 05/02/18 0447 05/03/18 0548  WBC 18.2* 19.5*  --   --  14.0* 18.2* 18.6*  HGB 8.6* 8.3* 8.8* 8.8* 7.8* 7.5* 6.5*  HCT 26.7* 26.6* 26.0* 26.0* 24.8* 23.6* 22.0*  MCV 76.7* 76.7*  --   --  75.8* 76.6* 80.3  PLT 62* 74*  --   --  73* 95* 98*    Basic Metabolic Panel: Recent Labs  Lab 04/27/18 0444  04/28/18 0353  04/29/18 0523 04/30/18 0547 04/30/18 1958 05/01/18 0211 05/01/18 0437 05/02/18 0447 05/03/18 0548  NA 143   < >  146*   < > 152* 147* 143 143 142 145 146*  K 3.9   < > 3.4*   < > 3.1* 3.1* 2.7* 3.1* 3.3* 3.3* 4.8  CL 99   < > 109  --  115* 112*  --   --  110 113* 115*  CO2 28   < > 24  --  26 24  --   --  22 21* 21*  GLUCOSE 101*   < > 120*  --  143* 119*  --   --  158* 145* 142*  BUN 49*   < > 53*  --  36* 20  --   --  16 14 16    CREATININE 3.85*   < > 3.24*  --  1.71* 1.01*  --   --  0.96 0.85 1.03*  CALCIUM 8.5*   < > 8.2*  --  8.0* 7.9*  --   --  7.2* 7.8* 7.6*  MG 2.3  --  2.0  --  2.0 1.7  --   --   --   --   --   PHOS 4.1  --  4.4  --  4.1 2.2*  --   --  3.2 2.4*  --    < > = values in this interval not displayed.   GFR: Estimated Creatinine Clearance: 53 mL/min (A) (by C-G formula based on SCr of 1.03 mg/dL (H)). Recent Labs  Lab 04/28/18 2129 04/29/18 0030  04/30/18 0547 05/01/18 0437 05/02/18 0447 05/03/18 0548  WBC  --   --    < > 19.5* 14.0* 18.2* 18.6*  LATICACIDVEN 1.2 1.2  --   --   --   --   --    < > = values in this interval not displayed.    Liver Function Tests: Recent Labs  Lab 04/29/18 0523 04/30/18 0547 05/01/18 0437 05/02/18 0447 05/03/18 0548  AST 466* 137* 64* 48* 45*  ALT 795* 464* 265* 160* 96*  ALKPHOS 165* 128* 111 105 89  BILITOT 2.7* 2.6* 2.1* 2.3* 2.2*  PROT 5.7* 5.5* 4.7* 5.6* 5.8*  ALBUMIN 3.4* 2.9* 2.4* 2.8* 2.4*   No results for input(s): LIPASE, AMYLASE in the last 168 hours. No results for input(s): AMMONIA in the last 168 hours.  ABG    Component Value Date/Time   PHART 7.397 05/01/2018 0211   PCO2ART 41.7 05/01/2018 0211   PO2ART 217.0 (H) 05/01/2018 0211   HCO3 25.7 05/01/2018 0211   TCO2 27 05/01/2018 0211   ACIDBASEDEF 10.7 (H) 04/25/2018 2119   O2SAT 100.0 05/01/2018 0211     Coagulation Profile: Recent Labs  Lab 04/27/18 0444 05/01/18 0437 05/02/18 0447 05/03/18 0548  INR 1.8* 1.7* 1.7* 1.7*    Cardiac Enzymes: No results for input(s): CKTOTAL, CKMB, CKMBINDEX, TROPONINI in the last 168 hours.  HbA1C: No results found for: HGBA1C  CBG: Recent Labs  Lab 05/02/18 1608 05/02/18 1943 05/02/18 2310 05/03/18 0324 05/03/18 0824  GLUCAP 111* 109* 128* 73 121*    Critical care time: 35 min    The patient is critically ill with multiple organ systems failure and requires high complexity decision making for assessment and  support, frequent evaluation and titration of therapies, application of advanced monitoring technologies and extensive interpretation of multiple databases.   Critical Care Time devoted to patient care services described in this note is 31 Minutes. This time reflects time of care of this signee. This critical care time does not reflect procedure time,  or teaching time or supervisory time of PA/NP/Med student/Med Resident etc but could involve care discussion time.  Bonna Gains   After hours pager: [phone]

## 2018-05-04 ENCOUNTER — Inpatient Hospital Stay (HOSPITAL_COMMUNITY): Payer: Medicaid Other

## 2018-05-04 LAB — CBC
HCT: 26.3 % — ABNORMAL LOW (ref 36.0–46.0)
Hemoglobin: 8.3 g/dL — ABNORMAL LOW (ref 12.0–15.0)
MCH: 25.6 pg — ABNORMAL LOW (ref 26.0–34.0)
MCHC: 31.6 g/dL (ref 30.0–36.0)
MCV: 81.2 fL (ref 80.0–100.0)
Platelets: 143 10*3/uL — ABNORMAL LOW (ref 150–400)
RBC: 3.24 MIL/uL — ABNORMAL LOW (ref 3.87–5.11)
RDW: 22.1 % — ABNORMAL HIGH (ref 11.5–15.5)
WBC: 21.7 10*3/uL — ABNORMAL HIGH (ref 4.0–10.5)
nRBC: 0 % (ref 0.0–0.2)

## 2018-05-04 LAB — COMPREHENSIVE METABOLIC PANEL
ALT: 71 U/L — ABNORMAL HIGH (ref 0–44)
AST: 34 U/L (ref 15–41)
Albumin: 2.4 g/dL — ABNORMAL LOW (ref 3.5–5.0)
Alkaline Phosphatase: 93 U/L (ref 38–126)
Anion gap: 12 (ref 5–15)
BUN: 11 mg/dL (ref 6–20)
CO2: 24 mmol/L (ref 22–32)
Calcium: 8.2 mg/dL — ABNORMAL LOW (ref 8.9–10.3)
Chloride: 112 mmol/L — ABNORMAL HIGH (ref 98–111)
Creatinine, Ser: 0.82 mg/dL (ref 0.44–1.00)
GFR calc Af Amer: >60 mL/min (ref >60)
GFR calc non Af Amer: >60 mL/min (ref >60)
Glucose, Bld: 111 mg/dL — ABNORMAL HIGH (ref 70–99)
Potassium: 4 mmol/L (ref 3.5–5.1)
Sodium: 148 mmol/L — ABNORMAL HIGH (ref 135–145)
Total Bilirubin: 2.2 mg/dL — ABNORMAL HIGH (ref 0.3–1.2)
Total Protein: 5.9 g/dL — ABNORMAL LOW (ref 6.5–8.1)

## 2018-05-04 LAB — GLUCOSE, CAPILLARY
Glucose-Capillary: 134 mg/dL — ABNORMAL HIGH (ref 70–99)
Glucose-Capillary: 88 mg/dL (ref 70–99)
Glucose-Capillary: 159 mg/dL — ABNORMAL HIGH (ref 70–99)
Glucose-Capillary: 99 mg/dL (ref 70–99)
Glucose-Capillary: 150 mg/dL — ABNORMAL HIGH (ref 70–99)
Glucose-Capillary: 136 mg/dL — ABNORMAL HIGH (ref 70–99)

## 2018-05-04 LAB — BPAM RBC
Blood Product Expiration Date: 202004182359
ISSUE DATE / TIME: 202004171104
Unit Type and Rh: 6200

## 2018-05-04 LAB — TYPE AND SCREEN
ABO/RH(D): A POS
Antibody Screen: NEGATIVE
Unit division: 0

## 2018-05-04 LAB — CULTURE, BLOOD (ROUTINE X 2)
Culture: NO GROWTH
Culture: NO GROWTH
Special Requests: ADEQUATE
Special Requests: ADEQUATE

## 2018-05-04 LAB — VANCOMYCIN, PEAK: Vancomycin Pk: 19 ug/mL — ABNORMAL LOW (ref 30–40)

## 2018-05-04 LAB — CULTURE, RESPIRATORY W GRAM STAIN: Gram Stain: NONE SEEN

## 2018-05-04 LAB — CULTURE, RESPIRATORY

## 2018-05-04 LAB — VANCOMYCIN, TROUGH: Vancomycin Tr: 8 ug/mL — ABNORMAL LOW (ref 15–20)

## 2018-05-04 MED ORDER — ETOMIDATE 2 MG/ML IV SOLN
20.0000 mg | Freq: Once | INTRAVENOUS | Status: AC
  Administered 2018-05-04: 20 mg via INTRAVENOUS

## 2018-05-04 MED ORDER — VANCOMYCIN HCL IN DEXTROSE 750-5 MG/150ML-% IV SOLN
750.0000 mg | Freq: Two times a day (BID) | INTRAVENOUS | Status: DC
  Administered 2018-05-04 – 2018-05-16 (×24): 750 mg via INTRAVENOUS
  Filled 2018-05-04 (×28): qty 150

## 2018-05-04 MED ORDER — ROCURONIUM BROMIDE 50 MG/5ML IV SOLN
50.0000 mg | Freq: Once | INTRAVENOUS | Status: AC
  Administered 2018-05-04: 50 mg via INTRAVENOUS
  Filled 2018-05-04: qty 5

## 2018-05-04 MED ORDER — LEVALBUTEROL HCL 0.63 MG/3ML IN NEBU
0.6300 mg | INHALATION_SOLUTION | Freq: Four times a day (QID) | RESPIRATORY_TRACT | Status: DC
  Administered 2018-05-04 – 2018-05-05 (×6): 0.63 mg via RESPIRATORY_TRACT
  Filled 2018-05-04 (×6): qty 3

## 2018-05-04 NOTE — Progress Notes (Signed)
Pharmacy Antibiotic Note  Jane Brooks is a 48 y.o. female admitted on 04/25/2018 with pancreatic cancer, now w/ recurrent fevers and reintubated.  Pharmacy has been consulted to broaden ABX w/ vancomycin, Zosyn, and fluconazole.  Vancomycin levels drawn tonight reveal subtherapeutic AUC of 315 (goal ~500), Cr stable.   Plan: Increase vancomycin to 771m IV q12h  Height: 4' 11"  (149.9 cm) Weight: 131 lb 2.8 oz (59.5 kg) IBW/kg (Calculated) : 43.2  Temp (24hrs), Avg:100.4 F (38 C), Min:98.7 F (37.1 C), Max:101.9 F (38.8 C)  Recent Labs  Lab 04/28/18 2129 04/29/18 0030  04/30/18 0547 05/01/18 0437 05/02/18 0447 05/03/18 0548 05/03/18 1813 05/04/18 0523 05/04/18 0915 05/04/18 1045 05/04/18 1950  WBC  --   --    < > 19.5* 14.0* 18.2* 18.6* 22.3* 21.7*  --   --   --   CREATININE  --   --    < > 1.01* 0.96 0.85 1.03*  --   --  0.82  --   --   LATICACIDVEN 1.2 1.2  --   --   --   --   --   --   --   --   --   --   VANCOTROUGH  --   --   --   --   --   --   --   --   --   --   --  8*  VANCOPEAK  --   --   --   --   --   --   --   --   --   --  19*  --    < > = values in this interval not displayed.    Estimated Creatinine Clearance: 65.1 mL/min (by C-G formula based on SCr of 0.82 mg/dL).    Allergies  Allergen Reactions  . Latex Hives    Antimicrobials this admission: Ancef pre-op 4/9 Vanc 4/12 >> 4/15; 4/16 >>  Cefepime 4/12 >> 4/16  Microbiology results: 4/12 BCx x 2 >> ngtd 4/9 MRSA PCR negative  Thank you for allowing pharmacy to be a part of this patient's care.   MArrie Senate PharmD, BCPS Clinical Pharmacist [phone] check AMION for all MRidgelynumbers 05/04/2018

## 2018-05-04 NOTE — Procedures (Signed)
Intubation Procedure Note Aveleen Nevers [id number] 1970/02/13  Procedure: Intubation Indications: Respiratory insufficiency  Procedure Details Consent: Unable to obtain consent because of emergent medical necessity. Time Out: Verified patient identification, verified procedure, site/side was marked, verified correct patient position, special equipment/implants available, medications/allergies/relevent history reviewed, required imaging and test results available.  Performed  Maximum sterile technique was used including gloves, hand hygiene and mask.  MAC  Evaluation Hemodynamic Status: BP stable throughout; O2 sats: stable throughout Patient's Current Condition: stable Complications: No apparent complications Patient did tolerate procedure well. Chest X-ray ordered to verify placement.  CXR: pending.   Oswald Hillock BIYAKI 05/04/2018

## 2018-05-04 NOTE — Progress Notes (Signed)
9 Days Post-Op   Subjective/Chief Complaint: Pt with no new changes overnight CT results noted.   Objective: Vital signs in last 24 hours: Temp:  [98.7 F (37.1 C)-101.3 F (38.5 C)] 98.7 F (37.1 C) (04/18 0400) Pulse Rate:  [122-135] 125 (04/18 0700) Resp:  [18-26] 18 (04/18 0700) BP: (119-152)/(59-90) 129/70 (04/18 0700) SpO2:  [96 %-100 %] 100 % (04/18 0700) FiO2 (%):  [40 %] 40 % (04/18 0404) Weight:  [59.5 kg] 59.5 kg (04/18 0500) Last BM Date: 05/03/18  Intake/Output from previous day: 04/17 0701 - 04/18 0700 In: 3749.6 [I.V.:1844.2; Blood:405; NG/GT:875; IV Piggyback:425.4] Out: 7408 [Urine:1725; Emesis/NG output:1000; Drains:600; Stool:700] Intake/Output this shift: No intake/output data recorded.  Constitutional: No acute distress, intubated/sedated, appears states age. Eyes: Anicteric sclerae, moist conjunctiva, no lid lag Lungs: Clear to auscultation bilaterally, normal respiratory effort CV: regular rate and rhythm, no murmurs, no peripheral edema, pedal pulses 2+ GI: Soft, no masses or hepatosplenomegaly, non-tender to palpation, jtube in place, JP SS, inc cdi Skin: No rashes, palpation reveals normal turgor    Lab Results:  Recent Labs    05/03/18 1813 05/04/18 0523  WBC 22.3* 21.7*  HGB 8.0* 8.3*  HCT 26.4* 26.3*  PLT 145* 143*   BMET Recent Labs    05/02/18 0447 05/03/18 0548  NA 145 146*  K 3.3* 4.8  CL 113* 115*  CO2 21* 21*  GLUCOSE 145* 142*  BUN 14 16  CREATININE 0.85 1.03*  CALCIUM 7.8* 7.6*   PT/INR Recent Labs    05/02/18 0447 05/03/18 0548  LABPROT 19.5* 19.4*  INR 1.7* 1.7*   ABG No results for input(s): PHART, HCO3 in the last 72 hours.  Invalid input(s): PCO2, PO2  Studies/Results: Ct Abdomen Pelvis Wo Contrast  Result Date: 05/03/2018 CLINICAL DATA:  Abdominal pain, fever. EXAM: CT CHEST, ABDOMEN AND PELVIS WITHOUT CONTRAST TECHNIQUE: Multidetector CT imaging of the chest, abdomen and pelvis was performed  following the standard protocol without IV contrast. COMPARISON:  CT scan of April 19, 2018. FINDINGS: CT CHEST FINDINGS Cardiovascular: No significant vascular findings. Normal heart size. No pericardial effusion. Mediastinum/Nodes: Endotracheal tube is in grossly good position. Nasogastric tube is seen passing through esophagus into proximal stomach. Right internal jugular Port-A-Cath is noted with distal tip in expected position of cavoatrial junction. Thyroid gland is unremarkable. No significant mediastinal adenopathy is noted. Lungs/Pleura: No pneumothorax or pleural effusion is noted. Diffuse bilateral airspace opacities are noted most consistent with pneumonia. Musculoskeletal: No chest wall mass or suspicious bone lesions identified. CT ABDOMEN PELVIS FINDINGS Hepatobiliary: Patient appears to be status post cholecystectomy. No definite biliary dilatation is seen at this time. There is interval development of large lobulated low density in right hepatic lobe posteriorly measuring 9.3 x 7.3 cm. This was not present on prior exam and is unlikely to represent metastatic disease given the short interval since prior exam. This is concerning for possible infection. Further evaluation with CT scan with intravenous contrast is recommended. Pancreas: Status post Whipple's procedure. Expected postoperative changes are seen in the region of the pancreatic head and body. At least 2 surgical drains are noted in the surgical bed. Spleen: Normal in size without focal abnormality. Adrenals/Urinary Tract: Adrenal glands and kidneys appear normal. No hydronephrosis or renal obstruction is noted. No renal or ureteral calculi are noted. Urinary bladder appears to be decompressed secondary to Foley catheter. Stomach/Bowel: Mild gastric distention is noted. Patient appears to be status post gastrojejunostomy consistent with recent Whipple's procedure. Jejunostomy tube is  in grossly good position. Significant wall thickening of  right colon and proximal transverse colon is noted which may represent infectious or inflammatory colitis. The distal transverse colon and descending colon as well as sigmoid colon are mildly dilated which may represent ileus. Rectal tube is noted. Vascular/Lymphatic: Aortic atherosclerosis. No enlarged abdominal or pelvic lymph nodes. Reproductive: Uterus and bilateral adnexa are unremarkable. Other: Midline surgical staples are noted in anterior abdominal wall. No definite hernia is noted. Musculoskeletal: No acute or significant osseous findings. IMPRESSION: Interval development of bilateral diffuse lung opacities most consistent with multilobar pneumonia. Status post Whipple's procedure with expected postoperative changes in the surgical bed. There is interval development of 9 cm lobulated low density in the right hepatic lobe which was not present on prior exam, and is highly concerning for possible infection. Further evaluation with CT scan with intravenous contrast is recommended. Severe wall thickening of right and proximal transverse colon is noted concerning for infectious or inflammatory colitis. Mild dilatation of distal colon is noted which may represent ileus. Rectal tube is noted. Endotracheal and nasogastric tubes are in grossly good position. Jejunostomy tube appears to be in good position. Two surgical drains are seen entering right upper quadrant of abdomen. Aortic Atherosclerosis (ICD10-I70.0). Electronically Signed   By: Marijo Conception M.D.   On: 05/03/2018 13:41   Ct Chest Wo Contrast  Result Date: 05/03/2018 CLINICAL DATA:  Abdominal pain, fever. EXAM: CT CHEST, ABDOMEN AND PELVIS WITHOUT CONTRAST TECHNIQUE: Multidetector CT imaging of the chest, abdomen and pelvis was performed following the standard protocol without IV contrast. COMPARISON:  CT scan of April 19, 2018. FINDINGS: CT CHEST FINDINGS Cardiovascular: No significant vascular findings. Normal heart size. No pericardial effusion.  Mediastinum/Nodes: Endotracheal tube is in grossly good position. Nasogastric tube is seen passing through esophagus into proximal stomach. Right internal jugular Port-A-Cath is noted with distal tip in expected position of cavoatrial junction. Thyroid gland is unremarkable. No significant mediastinal adenopathy is noted. Lungs/Pleura: No pneumothorax or pleural effusion is noted. Diffuse bilateral airspace opacities are noted most consistent with pneumonia. Musculoskeletal: No chest wall mass or suspicious bone lesions identified. CT ABDOMEN PELVIS FINDINGS Hepatobiliary: Patient appears to be status post cholecystectomy. No definite biliary dilatation is seen at this time. There is interval development of large lobulated low density in right hepatic lobe posteriorly measuring 9.3 x 7.3 cm. This was not present on prior exam and is unlikely to represent metastatic disease given the short interval since prior exam. This is concerning for possible infection. Further evaluation with CT scan with intravenous contrast is recommended. Pancreas: Status post Whipple's procedure. Expected postoperative changes are seen in the region of the pancreatic head and body. At least 2 surgical drains are noted in the surgical bed. Spleen: Normal in size without focal abnormality. Adrenals/Urinary Tract: Adrenal glands and kidneys appear normal. No hydronephrosis or renal obstruction is noted. No renal or ureteral calculi are noted. Urinary bladder appears to be decompressed secondary to Foley catheter. Stomach/Bowel: Mild gastric distention is noted. Patient appears to be status post gastrojejunostomy consistent with recent Whipple's procedure. Jejunostomy tube is in grossly good position. Significant wall thickening of right colon and proximal transverse colon is noted which may represent infectious or inflammatory colitis. The distal transverse colon and descending colon as well as sigmoid colon are mildly dilated which may  represent ileus. Rectal tube is noted. Vascular/Lymphatic: Aortic atherosclerosis. No enlarged abdominal or pelvic lymph nodes. Reproductive: Uterus and bilateral adnexa are unremarkable. Other: Midline surgical  staples are noted in anterior abdominal wall. No definite hernia is noted. Musculoskeletal: No acute or significant osseous findings. IMPRESSION: Interval development of bilateral diffuse lung opacities most consistent with multilobar pneumonia. Status post Whipple's procedure with expected postoperative changes in the surgical bed. There is interval development of 9 cm lobulated low density in the right hepatic lobe which was not present on prior exam, and is highly concerning for possible infection. Further evaluation with CT scan with intravenous contrast is recommended. Severe wall thickening of right and proximal transverse colon is noted concerning for infectious or inflammatory colitis. Mild dilatation of distal colon is noted which may represent ileus. Rectal tube is noted. Endotracheal and nasogastric tubes are in grossly good position. Jejunostomy tube appears to be in good position. Two surgical drains are seen entering right upper quadrant of abdomen. Aortic Atherosclerosis (ICD10-I70.0). Electronically Signed   By: Marijo Conception M.D.   On: 05/03/2018 13:41   Mr Abdomen W YS Contrast  Result Date: 05/04/2018 CLINICAL DATA:  Seven days post Whipple procedure. Low-attenuation region in the RIGHT hepatic lobe on noncontrast CT 05/03/2018. EXAM: MRI ABDOMEN WITHOUT AND WITH CONTRAST TECHNIQUE: Multiplanar multisequence MR imaging of the abdomen was performed both before and after the administration of intravenous contrast. CONTRAST:  7 mL Gadavist COMPARISON:  CT 05/03/2018, 04/19/2018 FINDINGS: Exam is limited by respiratory motion.  Patient on ventilator. Lower chest:  Infiltrates at the LEFT and RIGHT lung base. Hepatobiliary: Branching pattern of hypoperfusion in the RIGHT hepatic lobe  involving segments 5,6, 7 and 8. This branching pattern follows the vascular/biliary distribution (series 13 through 17) The vascular structures (hepatic arteries) are not define due to respiratory motion. The replaced LEFT hepatic artery (image 44/13) is subtly demonstrated. RIGHT hepatic artery not well demonstrated. The LEFT hepatic lobe appears normal.  No biliary duct dilatation. No fluid about the liver. The a portal veins appear grossly patent. Pancreas: post Whipple procedure. No abnormal fluid collections. A decreased dilatation of the pancreatic duct compared to prior. Spleen: Normal Adrenals/urinary tract: Adrenal glands and kidneys normal. Stomach/Bowel: Post partial gastrectomy.  No complicating features. Vascular/Lymphatic: Abdominal aortic normal caliber. No retroperitoneal periportal lymphadenopathy. Musculoskeletal: No aggressive osseous lesion IMPRESSION: 1. Branching hypoperfusion in the RIGHT hepatic lobe in a biliary/vascular distribution is most concerning for BILIARY NECROSIS presumably relating to damage to the RIGHT hepatic artery following Whipple procedure. Arterial vasculature not well evaluated due to respiratory motion (patient ventilated). A contrast enhanced abdominal CT a may provide further characterization. 2. LEFT hepatic lobe appears normal. Patent accessory LEFT hepatic artery subtly demonstrated. 3. No fluid collections. 4. Portal veins grossly patent. Findings conveyed toCornett, MD on 05/04/2018  at05:40. Electronically Signed   By: Suzy Bouchard M.D.   On: 05/04/2018 05:40   Portable Chest X-ray  Result Date: 05/04/2018 Crista Luria, MD     05/04/2018  4:31 AM Intubation Procedure Note Kippy Melena [id number] Oct 02, 1970 Procedure: Intubation Indications: Respiratory insufficiency Procedure Details Consent: Unable to obtain consent because of emergent medical necessity. Time Out: Verified patient identification, verified procedure, site/side was marked, verified  correct patient position, special equipment/implants available, medications/allergies/relevent history reviewed, required imaging and test results available.  Performed Maximum sterile technique was used including gloves, hand hygiene and mask. MAC Evaluation Hemodynamic Status: BP stable throughout; O2 sats: stable throughout Patient's Current Condition: stable Complications: No apparent complications Patient did tolerate procedure well. Chest X-ray ordered to verify placement.  CXR: pending. Oswald Hillock BIYAKI 05/04/2018    Anti-infectives: Anti-infectives (  From admission, onward)   Start     Dose/Rate Route Frequency Ordered Stop   05/03/18 1000  fluconazole (DIFLUCAN) IVPB 400 mg  Status:  Discontinued     400 mg 100 mL/hr over 120 Minutes Intravenous Every 24 hours 05/02/18 0721 05/02/18 0855   05/02/18 0800  vancomycin (VANCOCIN) 500 mg in sodium chloride 0.9 % 100 mL IVPB     500 mg 100 mL/hr over 60 Minutes Intravenous Every 12 hours 05/02/18 0721     05/02/18 0800  piperacillin-tazobactam (ZOSYN) IVPB 3.375 g     3.375 g 12.5 mL/hr over 240 Minutes Intravenous Every 8 hours 05/02/18 0721     05/02/18 0730  fluconazole (DIFLUCAN) IVPB 800 mg     800 mg 200 mL/hr over 120 Minutes Intravenous  Once 05/02/18 0721 05/02/18 0952   04/30/18 2230  vancomycin (VANCOCIN) IVPB 750 mg/150 ml premix  Status:  Discontinued     750 mg 150 mL/hr over 60 Minutes Intravenous Every 48 hours 04/28/18 2133 04/29/18 1121   04/30/18 2230  vancomycin (VANCOCIN) IVPB 1000 mg/200 mL premix  Status:  Discontinued     1,000 mg 200 mL/hr over 60 Minutes Intravenous Every 48 hours 04/29/18 1121 04/30/18 0904   04/30/18 1000  vancomycin (VANCOCIN) IVPB 1000 mg/200 mL premix     1,000 mg 200 mL/hr over 60 Minutes Intravenous Every 24 hours 04/30/18 0904 05/01/18 1011   04/30/18 0915  ceFEPIme (MAXIPIME) 2 g in sodium chloride 0.9 % 100 mL IVPB  Status:  Discontinued     2 g 200 mL/hr over 30 Minutes  Intravenous Every 12 hours 04/30/18 0905 05/02/18 0721   04/28/18 2130  ceFEPIme (MAXIPIME) 2 g in sodium chloride 0.9 % 100 mL IVPB  Status:  Discontinued     2 g 200 mL/hr over 30 Minutes Intravenous Every 24 hours 04/28/18 2113 04/30/18 0905   04/28/18 2130  vancomycin (VANCOCIN) 1,250 mg in sodium chloride 0.9 % 250 mL IVPB     1,250 mg 166.7 mL/hr over 90 Minutes Intravenous  Once 04/28/18 2129 04/29/18 0107   04/25/18 1530  ceFAZolin (ANCEF) IVPB 2g/100 mL premix     2 g 200 mL/hr over 30 Minutes Intravenous Every 8 hours 04/25/18 1520 04/25/18 1739   04/25/18 0600  ceFAZolin (ANCEF) IVPB 2g/100 mL premix     2 g 200 mL/hr over 30 Minutes Intravenous On call to O.R. 04/25/18 0545 04/25/18 1149      Assessment/Plan: s/p Procedure(s) with comments: LAPAROSCOPY DIAGNOSTIC (N/A) - GENERAL AND EPIDURAL ANESTHESIA WHIPPLE PROCEDURE, RECONSTRUCTION OF PORTAL VEIN (N/A) -Con't intubation/sedation as per CCM -tol TFs, creon -abx -drain amylase pending   LOS: 9 days    Ralene Ok 05/04/2018

## 2018-05-04 NOTE — Procedures (Signed)
Intubation Procedure Note Jane Brooks [id number] 09-23-1970  Procedure: Intubation Indications: Airway protection and maintenance  Procedure Details Consent: Unable to obtain consent because of altered level of consciousness. Time Out: Verified patient identification, verified procedure, site/side was marked, verified correct patient position, special equipment/implants available, medications/allergies/relevent history reviewed, required imaging and test results available.  Performed 0335  Maximum sterile technique was used including gloves, hand hygiene and mask.  MAC and 4    Evaluation Hemodynamic Status: BP stable throughout; O2 sats: stable throughout Patient's Current Condition: stable Complications: No apparent complications Patient did tolerate procedure well. Chest X-ray ordered to verify placement.  CXR: pending.   Milinda Cave 05/04/2018

## 2018-05-04 NOTE — Progress Notes (Signed)
During shift Rt and RN was unable to pass catheter successfully without lavaging. Around 0320 Rt lavaged patient and suctioned back multiple mucus plugs. Patient started getting back volumes as small as 23 on ventilator. Rt unhooked the vent and attempted to bag. After a failed attempt of bagging rt and RN called the box and extubated the patient. The patient's ETT was occluded at the end and a quarter sized mucus plug came out. Patient was reintubated and stable throughout.

## 2018-05-04 NOTE — Plan of Care (Signed)
PCCM Plan of Care Note Evaluated Jane Brooks at bedside for intubation.  She is a 48 YO female with pancreatic adenocarcinoma s/p chemotherapy admitted for Whipple on 04/25/2018.  She remained intubated post op, was extubated on 4/10 but then developed resp distress due to HCAP and was reintubated on 4/14.  She developed low tidal volumes with desaturation. RT attempted bagging her and she was difficult to bag. Ultimately she was extubated and mucus plug found to be occluding the ET. During extubation OG tube fractured.  Patient evaluated. Tachycardic, satting 100%, encephalopathic and cannot maintain airway patency.  Plan -Intubated -see separate intubation procedure note -Obtain CXR to evaluate tube position and position of fractured OG tube -Depending on position of OG tube she might need endoscopy for retrieval  Critical Care time: 30 minutes  Oswald Hillock, MD [place]

## 2018-05-04 NOTE — Progress Notes (Signed)
NAME:  Jane Brooks, MRN:  [id number], DOB:  11-Feb-1970, LOS: 9 ADMISSION DATE:  04/25/2018, CONSULTATION DATE:  04/25/18 REFERRING MD:  Barry Dienes, MD CHIEF COMPLAINT:  Acute hypoxemic respiratory failure  Brief History   48 year old female with pancreatic adenocarcinoma s/p chemotherapy admitted for Whipple on 4/9. PCCM consulted for post-op hypotension, AKI and shock liver. Extubated on 4/10 however developed respiratory distress secondary to HCAP and was reintubated on 4/15.  Significant Hospital Events   4/9 Laparotomy 4/15 Re-intubated  Consults:  PCCM  Procedures:  ETT 4/9>4/10 ETT 4/15  Significant Diagnostic Tests:  CT A/P 4/3  Panceratic mass with duct dilation CTA 4/9 No PE, s/p pancreas head mass removal, s/p pancreatic stent CT 4/17: PNA; low attenuation 9cm right lobe of liver,  MRI ABD 4/17: favors biliary necrosis.   Micro Data:  [place] 4/12 >no growth; trach aspirate unremarkable  BCX 4/16> pending  Antimicrobials:  Cefazolin 4/9 once Cefepime 4/12> Vanc 4/12>  Interim history/subjective:  Re-intubated 4/15 for hypoxemic respiratory failure secondary to pneumonia  Objective   Blood pressure 129/70, pulse (!) 125, temperature (!) 101.2 F (38.4 C), temperature source Axillary, resp. rate 18, height 4' 11"  (1.499 m), weight 59.5 kg, SpO2 100 %.    Vent Mode: PRVC FiO2 (%):  [40 %] 40 % Set Rate:  [18 bmp] 18 bmp Vt Set:  [450 mL] 450 mL PEEP:  [5 cmH20] 5 cmH20 Pressure Support:  [12 cmH20] 12 cmH20 Plateau Pressure:  [18 cmH20-25 cmH20] 24 cmH20   Intake/Output Summary (Last 24 hours) at 05/04/2018 0852 Last data filed at 05/04/2018 0600 Gross per 24 hour  Intake 3559.24 ml  Output 4025 ml  Net -465.76 ml   Filed Weights   05/02/18 0400 05/03/18 0500 05/04/18 0500  Weight: 59 kg 62.1 kg 59.5 kg    Physical Exam: General: Chronically ill-appearing, sedated HENT: [place], AT, ETT in place Respiratory: Decreased air movement, mild diffuse wheezing  Cardiovascular: RRR, -M/R/G, no JVD GI: BS+, soft, nontender Extremities:-Edema,-tenderness Neuro: Sedated; PERRLA, w/d to stim GU: Foley in place  CXR 4/16 Multifocal pneumonia   Resolved Hospital Problem list   AKI Hypotension  Assessment & Plan:   Acute hypoxemic respiratory failure  Reintubated on 4/15. Presumed hospital acquire pneumonia Plan: Full vent support. SBT as tolerated Follow-up BCX Continue broad-spectrum antibiotics Fluconazole restarted Per Dr. Barry Dienes to MRI today for  abd, pelvis  study to eval 9 cm liver   Pancreatic head cancer s/p Whipple Management per surgery Epidural removed Plan to resume DVT ppx asap Morphine gtt. Start enteral morphine to facilitate weaning; monitor renal clearance to ensure against metabolite accumulation  Hypokalemia Hypophosphatemia Plan: Replete  Shock Liver Improving. Elevated INR s/p FFP ? Biliary necrosis on MRI; disposition per surgery. Will follow.  Plan: Trend INR  Best practice:  Diet: TF Pain/Anxiety/Delirium protocol (if indicated): Yes VAP protocol (if indicated): Yes DVT prophylaxis: Will need lovenox ppx once epidural is removed GI prophylaxis: PPI Glucose control: SSI Mobility: BR Code Status: Full Family Communication: No family at bedside Disposition: ICU  Labs   CBC: Recent Labs  Lab 05/01/18 0437 05/02/18 0447 05/03/18 0548 05/03/18 1813 05/04/18 0523  WBC 14.0* 18.2* 18.6* 22.3* 21.7*  HGB 7.8* 7.5* 6.5* 8.0* 8.3*  HCT 24.8* 23.6* 22.0* 26.4* 26.3*  MCV 75.8* 76.6* 80.3 81.0 81.2  PLT 73* 95* 98* 145* 143*    Basic Metabolic Panel: Recent Labs  Lab 04/28/18 0353  04/29/18 0523 04/30/18 0547 04/30/18 1958 05/01/18 0211  05/01/18 0437 05/02/18 0447 05/03/18 0548  NA 146*   < > 152* 147* 143 143 142 145 146*  K 3.4*   < > 3.1* 3.1* 2.7* 3.1* 3.3* 3.3* 4.8  CL 109  --  115* 112*  --   --  110 113* 115*  CO2 24  --  26 24  --   --  22 21* 21*  GLUCOSE 120*  --  143* 119*   --   --  158* 145* 142*  BUN 53*  --  36* 20  --   --  16 14 16   CREATININE 3.24*  --  1.71* 1.01*  --   --  0.96 0.85 1.03*  CALCIUM 8.2*  --  8.0* 7.9*  --   --  7.2* 7.8* 7.6*  MG 2.0  --  2.0 1.7  --   --   --   --   --   PHOS 4.4  --  4.1 2.2*  --   --  3.2 2.4*  --    < > = values in this interval not displayed.   GFR: Estimated Creatinine Clearance: 51.8 mL/min (A) (by C-G formula based on SCr of 1.03 mg/dL (H)). Recent Labs  Lab 04/28/18 2129 04/29/18 0030  05/02/18 0447 05/03/18 0548 05/03/18 1813 05/04/18 0523  WBC  --   --    < > 18.2* 18.6* 22.3* 21.7*  LATICACIDVEN 1.2 1.2  --   --   --   --   --    < > = values in this interval not displayed.    Liver Function Tests: Recent Labs  Lab 04/29/18 0523 04/30/18 0547 05/01/18 0437 05/02/18 0447 05/03/18 0548  AST 466* 137* 64* 48* 45*  ALT 795* 464* 265* 160* 96*  ALKPHOS 165* 128* 111 105 89  BILITOT 2.7* 2.6* 2.1* 2.3* 2.2*  PROT 5.7* 5.5* 4.7* 5.6* 5.8*  ALBUMIN 3.4* 2.9* 2.4* 2.8* 2.4*   No results for input(s): LIPASE, AMYLASE in the last 168 hours. No results for input(s): AMMONIA in the last 168 hours.  ABG    Component Value Date/Time   PHART 7.397 05/01/2018 0211   PCO2ART 41.7 05/01/2018 0211   PO2ART 217.0 (H) 05/01/2018 0211   HCO3 25.7 05/01/2018 0211   TCO2 27 05/01/2018 0211   ACIDBASEDEF 10.7 (H) 04/25/2018 2119   O2SAT 100.0 05/01/2018 0211     Coagulation Profile: Recent Labs  Lab 05/01/18 0437 05/02/18 0447 05/03/18 0548  INR 1.7* 1.7* 1.7*    Cardiac Enzymes: No results for input(s): CKTOTAL, CKMB, CKMBINDEX, TROPONINI in the last 168 hours.  HbA1C: No results found for: HGBA1C  CBG: Recent Labs  Lab 05/03/18 1607 05/03/18 1920 05/03/18 2325 05/04/18 0308 05/04/18 0723  GLUCAP 120* 132* 113* 134* 88    Critical care time: 32 min    The patient is critically ill with multiple organ systems failure and requires high complexity decision making for assessment  and support, frequent evaluation and titration of therapies, application of advanced monitoring technologies and extensive interpretation of multiple databases.   Critical Care Time devoted to patient care services described in this note is 32 Minutes. This time reflects time of care of this signee. This critical care time does not reflect procedure time, or teaching time or supervisory time of PA/NP/Med student/Med Resident etc but could involve care discussion time.  Bonna Gains   After hours pager: [phone]

## 2018-05-05 LAB — URINALYSIS, ROUTINE W REFLEX MICROSCOPIC
Bilirubin Urine: NEGATIVE
Glucose, UA: NEGATIVE mg/dL
Ketones, ur: NEGATIVE mg/dL
Leukocytes,Ua: NEGATIVE
Nitrite: NEGATIVE
Protein, ur: NEGATIVE mg/dL
RBC / HPF: >50 RBC/hpf — ABNORMAL HIGH (ref 0–5)
Specific Gravity, Urine: 1.023 (ref 1.005–1.030)
pH: 5 (ref 5.0–8.0)

## 2018-05-05 LAB — POCT I-STAT 7, (LYTES, BLD GAS, ICA,H+H)
Acid-Base Excess: 1 mmol/L (ref 0.0–2.0)
Bicarbonate: 25 mmol/L (ref 20.0–28.0)
Calcium, Ion: 1.15 mmol/L (ref 1.15–1.40)
HCT: 26 % — ABNORMAL LOW (ref 36.0–46.0)
Hemoglobin: 8.8 g/dL — ABNORMAL LOW (ref 12.0–15.0)
O2 Saturation: 95 %
Potassium: 3.8 mmol/L (ref 3.5–5.1)
Sodium: 146 mmol/L — ABNORMAL HIGH (ref 135–145)
TCO2: 26 mmol/L (ref 22–32)
pCO2 arterial: 37.1 mmHg (ref 32.0–48.0)
pH, Arterial: 7.436 (ref 7.350–7.450)
pO2, Arterial: 71 mmHg — ABNORMAL LOW (ref 83.0–108.0)

## 2018-05-05 LAB — CBC
HCT: 26 % — ABNORMAL LOW (ref 36.0–46.0)
Hemoglobin: 7.9 g/dL — ABNORMAL LOW (ref 12.0–15.0)
MCH: 24.4 pg — ABNORMAL LOW (ref 26.0–34.0)
MCHC: 30.4 g/dL (ref 30.0–36.0)
MCV: 80.2 fL (ref 80.0–100.0)
Platelets: 159 10*3/uL (ref 150–400)
RBC: 3.24 MIL/uL — ABNORMAL LOW (ref 3.87–5.11)
RDW: 22.5 % — ABNORMAL HIGH (ref 11.5–15.5)
WBC: 14.8 10*3/uL — ABNORMAL HIGH (ref 4.0–10.5)
nRBC: 0 % (ref 0.0–0.2)

## 2018-05-05 LAB — COMPREHENSIVE METABOLIC PANEL
ALT: 48 U/L — ABNORMAL HIGH (ref 0–44)
AST: 25 U/L (ref 15–41)
Albumin: 2 g/dL — ABNORMAL LOW (ref 3.5–5.0)
Alkaline Phosphatase: 83 U/L (ref 38–126)
Anion gap: 8 (ref 5–15)
BUN: 9 mg/dL (ref 6–20)
CO2: 26 mmol/L (ref 22–32)
Calcium: 7.8 mg/dL — ABNORMAL LOW (ref 8.9–10.3)
Chloride: 111 mmol/L (ref 98–111)
Creatinine, Ser: 0.73 mg/dL (ref 0.44–1.00)
GFR calc Af Amer: >60 mL/min (ref >60)
GFR calc non Af Amer: >60 mL/min (ref >60)
Glucose, Bld: 165 mg/dL — ABNORMAL HIGH (ref 70–99)
Potassium: 3.7 mmol/L (ref 3.5–5.1)
Sodium: 145 mmol/L (ref 135–145)
Total Bilirubin: 1.5 mg/dL — ABNORMAL HIGH (ref 0.3–1.2)
Total Protein: 5.8 g/dL — ABNORMAL LOW (ref 6.5–8.1)

## 2018-05-05 LAB — GLUCOSE, CAPILLARY
Glucose-Capillary: 137 mg/dL — ABNORMAL HIGH (ref 70–99)
Glucose-Capillary: 154 mg/dL — ABNORMAL HIGH (ref 70–99)
Glucose-Capillary: 117 mg/dL — ABNORMAL HIGH (ref 70–99)
Glucose-Capillary: 128 mg/dL — ABNORMAL HIGH (ref 70–99)
Glucose-Capillary: 160 mg/dL — ABNORMAL HIGH (ref 70–99)
Glucose-Capillary: 138 mg/dL — ABNORMAL HIGH (ref 70–99)

## 2018-05-05 LAB — LIPASE, FLUID
Lipase-Fluid: 3 U/L
Lipase-Fluid: <3 U/L

## 2018-05-05 NOTE — Progress Notes (Signed)
ABG drawn x1 attempt. Results shown to Dr. Maryjean Ka. Verbal order to increase FiO2 to 40%. No other vent changes at this time. RN notified.

## 2018-05-05 NOTE — Progress Notes (Signed)
NAME:  Jane Brooks, MRN:  [id number], DOB:  1970-08-06, LOS: 22 ADMISSION DATE:  04/25/2018, CONSULTATION DATE:  04/25/18 REFERRING MD:  Barry Dienes, MD CHIEF COMPLAINT:  Acute hypoxemic respiratory failure  Brief History   48 year old female with pancreatic adenocarcinoma s/p chemotherapy admitted for Whipple on 4/9. PCCM consulted for post-op hypotension, AKI and shock liver. Extubated on 4/10 however developed respiratory distress secondary to HCAP and was reintubated on 4/15.  Significant Hospital Events   4/9 Laparotomy 4/15 Re-intubated  Consults:  PCCM  Procedures:  ETT 4/9>4/10 ETT 4/15  Significant Diagnostic Tests:  CT A/P 4/3  Panceratic mass with duct dilation CTA 4/9 No PE, s/p pancreas head mass removal, s/p pancreatic stent CT 4/17: PNA; low attenuation 9cm right lobe of liver,  MRI ABD 4/17: favors biliary necrosis.   Micro Data:  [place] 4/12 >no growth; trach aspirate unremarkable  BCX 4/16> pending  Antimicrobials:  Cefazolin 4/9 once Cefepime 4/12> Vanc 4/12>  Interim history/subjective:  Re-intubated 4/15 for hypoxemic respiratory failure secondary to pneumonia  Objective   Blood pressure 126/69, pulse (!) 121, temperature 100.2 F (37.9 C), temperature source Axillary, resp. rate 18, height 4' 11"  (1.499 m), weight 59.5 kg, SpO2 98 %.    Vent Mode: PSV;CPAP FiO2 (%):  [30 %-40 %] 30 % Set Rate:  [18 bmp] 18 bmp Vt Set:  [450 mL] 450 mL PEEP:  [5 cmH20] 5 cmH20 Pressure Support:  [14 cmH20] 14 cmH20 Plateau Pressure:  [21 cmH20-25 cmH20] 22 cmH20   Intake/Output Summary (Last 24 hours) at 05/05/2018 1140 Last data filed at 05/05/2018 1000 Gross per 24 hour  Intake 4123.51 ml  Output 3405 ml  Net 718.51 ml   Filed Weights   05/02/18 0400 05/03/18 0500 05/04/18 0500  Weight: 59 kg 62.1 kg 59.5 kg    Physical Exam: General: Chronically ill-appearing, sedated HENT: , AT, ETT in place Respiratory: Decreased air movement, mild diffuse wheezing  Cardiovascular: RRR, -M/R/G, no JVD GI: BS+, soft, nontender Extremities:-Edema,-tenderness Neuro: Sedated; PERRLA, w/d to stim GU: Foley in place  CXR 4/16 Multifocal pneumonia   Resolved Hospital Problem list   AKI Hypotension  Assessment & Plan:   Acute hypoxemic respiratory failure  Reintubated on 4/15. Presumed hospital acquire pneumonia Plan: Full vent support. SBT as tolerated Follow-up BCX Continue broad-spectrum antibiotics Fluconazole restarted Drain amylase pending  Pancreatic head cancer s/p Whipple Management per surgery Epidural removed Plan to resume DVT ppx asap Morphine gtt. Start enteral morphine to facilitate weaning; monitor renal clearance to ensure against metabolite accumulation  Hypokalemia Hypophosphatemia Plan: Replete  Shock Liver Improving. Elevated INR s/p FFP ? Biliary necrosis on MRI; disposition per surgery. Will follow.  Plan: Trend INR  Best practice:  Diet: TF Pain/Anxiety/Delirium protocol (if indicated): Yes VAP protocol (if indicated): Yes DVT prophylaxis: Will need lovenox ppx once epidural is removed GI prophylaxis: PPI Glucose control: SSI Mobility: BR Code Status: Full Family Communication: No family at bedside Disposition: ICU  Labs   CBC: Recent Labs  Lab 05/02/18 0447 05/03/18 0548 05/03/18 1813 05/04/18 0523 05/05/18 0545  WBC 18.2* 18.6* 22.3* 21.7* 14.8*  HGB 7.5* 6.5* 8.0* 8.3* 7.9*  HCT 23.6* 22.0* 26.4* 26.3* 26.0*  MCV 76.6* 80.3 81.0 81.2 80.2  PLT 95* 98* 145* 143* 030    Basic Metabolic Panel: Recent Labs  Lab 04/29/18 0523 04/30/18 0547  05/01/18 0437 05/02/18 0447 05/03/18 0548 05/04/18 0915 05/05/18 0545  NA 152* 147*   < > 142 145 146*  148* 145  K 3.1* 3.1*   < > 3.3* 3.3* 4.8 4.0 3.7  CL 115* 112*  --  110 113* 115* 112* 111  CO2 26 24  --  22 21* 21* 24 26  GLUCOSE 143* 119*  --  158* 145* 142* 111* 165*  BUN 36* 20  --  16 14 16 11 9   CREATININE 1.71* 1.01*  --  0.96  0.85 1.03* 0.82 0.73  CALCIUM 8.0* 7.9*  --  7.2* 7.8* 7.6* 8.2* 7.8*  MG 2.0 1.7  --   --   --   --   --   --   PHOS 4.1 2.2*  --  3.2 2.4*  --   --   --    < > = values in this interval not displayed.   GFR: Estimated Creatinine Clearance: 66.7 mL/min (by C-G formula based on SCr of 0.73 mg/dL). Recent Labs  Lab 04/28/18 2129 04/29/18 0030  05/03/18 0548 05/03/18 1813 05/04/18 0523 05/05/18 0545  WBC  --   --    < > 18.6* 22.3* 21.7* 14.8*  LATICACIDVEN 1.2 1.2  --   --   --   --   --    < > = values in this interval not displayed.    Liver Function Tests: Recent Labs  Lab 05/01/18 0437 05/02/18 0447 05/03/18 0548 05/04/18 0915 05/05/18 0545  AST 64* 48* 45* 34 25  ALT 265* 160* 96* 71* 48*  ALKPHOS 111 105 89 93 83  BILITOT 2.1* 2.3* 2.2* 2.2* 1.5*  PROT 4.7* 5.6* 5.8* 5.9* 5.8*  ALBUMIN 2.4* 2.8* 2.4* 2.4* 2.0*   No results for input(s): LIPASE, AMYLASE in the last 168 hours. No results for input(s): AMMONIA in the last 168 hours.  ABG    Component Value Date/Time   PHART 7.397 05/01/2018 0211   PCO2ART 41.7 05/01/2018 0211   PO2ART 217.0 (H) 05/01/2018 0211   HCO3 25.7 05/01/2018 0211   TCO2 27 05/01/2018 0211   ACIDBASEDEF 10.7 (H) 04/25/2018 2119   O2SAT 100.0 05/01/2018 0211     Coagulation Profile: Recent Labs  Lab 05/01/18 0437 05/02/18 0447 05/03/18 0548  INR 1.7* 1.7* 1.7*    Cardiac Enzymes: No results for input(s): CKTOTAL, CKMB, CKMBINDEX, TROPONINI in the last 168 hours.  HbA1C: No results found for: HGBA1C  CBG: Recent Labs  Lab 05/04/18 1925 05/04/18 2313 05/05/18 0401 05/05/18 0737 05/05/18 1110  GLUCAP 150* 136* 137* 154* 117*    Critical care time: 32 min    The patient is critically ill with multiple organ systems failure and requires high complexity decision making for assessment and support, frequent evaluation and titration of therapies, application of advanced monitoring technologies and extensive interpretation  of multiple databases.   Critical Care Time devoted to patient care services described in this note is 34 Minutes. This time reflects time of care of this signee. This critical care time does not reflect procedure time, or teaching time or supervisory time of PA/NP/Med student/Med Resident etc but could involve care discussion time.  Bonna Gains   After hours pager: [phone]

## 2018-05-05 NOTE — Progress Notes (Signed)
10 Days Post-Op   Subjective/Chief Complaint: Pt with no changes overnight Con't sedated on vent   Objective: Vital signs in last 24 hours: Temp:  [99 F (37.2 C)-101.9 F (38.8 C)] 100.6 F (38.1 C) (04/19 0400) Pulse Rate:  [122-137] 124 (04/19 0700) Resp:  [15-20] 19 (04/19 0700) BP: (108-150)/(50-84) 128/59 (04/19 0700) SpO2:  [98 %-100 %] 99 % (04/19 0700) FiO2 (%):  [30 %-40 %] 30 % (04/19 0319) Last BM Date: (flexi)  Intake/Output from previous day: 04/18 0701 - 04/19 0700 In: 4093.8 [I.V.:1918; RT/MY:1117; IV Piggyback:600.8] Out: 3567 [OLIDC:3013; Emesis/NG output:400; Drains:475; HYHOO:875] Intake/Output this shift: No intake/output data recorded.  PE: Constitutional: No acute distress, intubated/sedated, appears states age. Eyes: Anicteric sclerae, moist conjunctiva, no lid lag Lungs: Clear to auscultation bilaterally, normal respiratory effort CV: regular rate and rhythm, no murmurs, no peripheral edema, pedal pulses 2+ GI: Soft, no masses or hepatosplenomegaly, non-tender to palpation, jtube in place, JP SS, inc cdi Skin: No rashes, palpation reveals normal turgor   Lab Results:  Recent Labs    05/04/18 0523 05/05/18 0545  WBC 21.7* 14.8*  HGB 8.3* 7.9*  HCT 26.3* 26.0*  PLT 143* 159   BMET Recent Labs    05/04/18 0915 05/05/18 0545  NA 148* 145  K 4.0 3.7  CL 112* 111  CO2 24 26  GLUCOSE 111* 165*  BUN 11 9  CREATININE 0.82 0.73  CALCIUM 8.2* 7.8*   PT/INR Recent Labs    05/03/18 0548  LABPROT 19.4*  INR 1.7*   ABG No results for input(s): PHART, HCO3 in the last 72 hours.  Invalid input(s): PCO2, PO2  Studies/Results: Ct Abdomen Pelvis Wo Contrast  Result Date: 05/03/2018 CLINICAL DATA:  Abdominal pain, fever. EXAM: CT CHEST, ABDOMEN AND PELVIS WITHOUT CONTRAST TECHNIQUE: Multidetector CT imaging of the chest, abdomen and pelvis was performed following the standard protocol without IV contrast. COMPARISON:  CT scan of April 19, 2018. FINDINGS: CT CHEST FINDINGS Cardiovascular: No significant vascular findings. Normal heart size. No pericardial effusion. Mediastinum/Nodes: Endotracheal tube is in grossly good position. Nasogastric tube is seen passing through esophagus into proximal stomach. Right internal jugular Port-A-Cath is noted with distal tip in expected position of cavoatrial junction. Thyroid gland is unremarkable. No significant mediastinal adenopathy is noted. Lungs/Pleura: No pneumothorax or pleural effusion is noted. Diffuse bilateral airspace opacities are noted most consistent with pneumonia. Musculoskeletal: No chest wall mass or suspicious bone lesions identified. CT ABDOMEN PELVIS FINDINGS Hepatobiliary: Patient appears to be status post cholecystectomy. No definite biliary dilatation is seen at this time. There is interval development of large lobulated low density in right hepatic lobe posteriorly measuring 9.3 x 7.3 cm. This was not present on prior exam and is unlikely to represent metastatic disease given the short interval since prior exam. This is concerning for possible infection. Further evaluation with CT scan with intravenous contrast is recommended. Pancreas: Status post Whipple's procedure. Expected postoperative changes are seen in the region of the pancreatic head and body. At least 2 surgical drains are noted in the surgical bed. Spleen: Normal in size without focal abnormality. Adrenals/Urinary Tract: Adrenal glands and kidneys appear normal. No hydronephrosis or renal obstruction is noted. No renal or ureteral calculi are noted. Urinary bladder appears to be decompressed secondary to Foley catheter. Stomach/Bowel: Mild gastric distention is noted. Patient appears to be status post gastrojejunostomy consistent with recent Whipple's procedure. Jejunostomy tube is in grossly good position. Significant wall thickening of right colon and proximal  transverse colon is noted which may represent infectious or  inflammatory colitis. The distal transverse colon and descending colon as well as sigmoid colon are mildly dilated which may represent ileus. Rectal tube is noted. Vascular/Lymphatic: Aortic atherosclerosis. No enlarged abdominal or pelvic lymph nodes. Reproductive: Uterus and bilateral adnexa are unremarkable. Other: Midline surgical staples are noted in anterior abdominal wall. No definite hernia is noted. Musculoskeletal: No acute or significant osseous findings. IMPRESSION: Interval development of bilateral diffuse lung opacities most consistent with multilobar pneumonia. Status post Whipple's procedure with expected postoperative changes in the surgical bed. There is interval development of 9 cm lobulated low density in the right hepatic lobe which was not present on prior exam, and is highly concerning for possible infection. Further evaluation with CT scan with intravenous contrast is recommended. Severe wall thickening of right and proximal transverse colon is noted concerning for infectious or inflammatory colitis. Mild dilatation of distal colon is noted which may represent ileus. Rectal tube is noted. Endotracheal and nasogastric tubes are in grossly good position. Jejunostomy tube appears to be in good position. Two surgical drains are seen entering right upper quadrant of abdomen. Aortic Atherosclerosis (ICD10-I70.0). Electronically Signed   By: Marijo Conception M.D.   On: 05/03/2018 13:41   Ct Chest Wo Contrast  Result Date: 05/03/2018 CLINICAL DATA:  Abdominal pain, fever. EXAM: CT CHEST, ABDOMEN AND PELVIS WITHOUT CONTRAST TECHNIQUE: Multidetector CT imaging of the chest, abdomen and pelvis was performed following the standard protocol without IV contrast. COMPARISON:  CT scan of April 19, 2018. FINDINGS: CT CHEST FINDINGS Cardiovascular: No significant vascular findings. Normal heart size. No pericardial effusion. Mediastinum/Nodes: Endotracheal tube is in grossly good position. Nasogastric tube  is seen passing through esophagus into proximal stomach. Right internal jugular Port-A-Cath is noted with distal tip in expected position of cavoatrial junction. Thyroid gland is unremarkable. No significant mediastinal adenopathy is noted. Lungs/Pleura: No pneumothorax or pleural effusion is noted. Diffuse bilateral airspace opacities are noted most consistent with pneumonia. Musculoskeletal: No chest wall mass or suspicious bone lesions identified. CT ABDOMEN PELVIS FINDINGS Hepatobiliary: Patient appears to be status post cholecystectomy. No definite biliary dilatation is seen at this time. There is interval development of large lobulated low density in right hepatic lobe posteriorly measuring 9.3 x 7.3 cm. This was not present on prior exam and is unlikely to represent metastatic disease given the short interval since prior exam. This is concerning for possible infection. Further evaluation with CT scan with intravenous contrast is recommended. Pancreas: Status post Whipple's procedure. Expected postoperative changes are seen in the region of the pancreatic head and body. At least 2 surgical drains are noted in the surgical bed. Spleen: Normal in size without focal abnormality. Adrenals/Urinary Tract: Adrenal glands and kidneys appear normal. No hydronephrosis or renal obstruction is noted. No renal or ureteral calculi are noted. Urinary bladder appears to be decompressed secondary to Foley catheter. Stomach/Bowel: Mild gastric distention is noted. Patient appears to be status post gastrojejunostomy consistent with recent Whipple's procedure. Jejunostomy tube is in grossly good position. Significant wall thickening of right colon and proximal transverse colon is noted which may represent infectious or inflammatory colitis. The distal transverse colon and descending colon as well as sigmoid colon are mildly dilated which may represent ileus. Rectal tube is noted. Vascular/Lymphatic: Aortic atherosclerosis. No  enlarged abdominal or pelvic lymph nodes. Reproductive: Uterus and bilateral adnexa are unremarkable. Other: Midline surgical staples are noted in anterior abdominal wall. No definite hernia is noted.  Musculoskeletal: No acute or significant osseous findings. IMPRESSION: Interval development of bilateral diffuse lung opacities most consistent with multilobar pneumonia. Status post Whipple's procedure with expected postoperative changes in the surgical bed. There is interval development of 9 cm lobulated low density in the right hepatic lobe which was not present on prior exam, and is highly concerning for possible infection. Further evaluation with CT scan with intravenous contrast is recommended. Severe wall thickening of right and proximal transverse colon is noted concerning for infectious or inflammatory colitis. Mild dilatation of distal colon is noted which may represent ileus. Rectal tube is noted. Endotracheal and nasogastric tubes are in grossly good position. Jejunostomy tube appears to be in good position. Two surgical drains are seen entering right upper quadrant of abdomen. Aortic Atherosclerosis (ICD10-I70.0). Electronically Signed   By: Marijo Conception M.D.   On: 05/03/2018 13:41   Mr Abdomen W SW Contrast  Result Date: 05/04/2018 CLINICAL DATA:  Seven days post Whipple procedure. Low-attenuation region in the RIGHT hepatic lobe on noncontrast CT 05/03/2018. EXAM: MRI ABDOMEN WITHOUT AND WITH CONTRAST TECHNIQUE: Multiplanar multisequence MR imaging of the abdomen was performed both before and after the administration of intravenous contrast. CONTRAST:  7 mL Gadavist COMPARISON:  CT 05/03/2018, 04/19/2018 FINDINGS: Exam is limited by respiratory motion.  Patient on ventilator. Lower chest:  Infiltrates at the LEFT and RIGHT lung base. Hepatobiliary: Branching pattern of hypoperfusion in the RIGHT hepatic lobe involving segments 5,6, 7 and 8. This branching pattern follows the vascular/biliary  distribution (series 13 through 17) The vascular structures (hepatic arteries) are not define due to respiratory motion. The replaced LEFT hepatic artery (image 44/13) is subtly demonstrated. RIGHT hepatic artery not well demonstrated. The LEFT hepatic lobe appears normal.  No biliary duct dilatation. No fluid about the liver. The a portal veins appear grossly patent. Pancreas: post Whipple procedure. No abnormal fluid collections. A decreased dilatation of the pancreatic duct compared to prior. Spleen: Normal Adrenals/urinary tract: Adrenal glands and kidneys normal. Stomach/Bowel: Post partial gastrectomy.  No complicating features. Vascular/Lymphatic: Abdominal aortic normal caliber. No retroperitoneal periportal lymphadenopathy. Musculoskeletal: No aggressive osseous lesion IMPRESSION: 1. Branching hypoperfusion in the RIGHT hepatic lobe in a biliary/vascular distribution is most concerning for BILIARY NECROSIS presumably relating to damage to the RIGHT hepatic artery following Whipple procedure. Arterial vasculature not well evaluated due to respiratory motion (patient ventilated). A contrast enhanced abdominal CT a may provide further characterization. 2. LEFT hepatic lobe appears normal. Patent accessory LEFT hepatic artery subtly demonstrated. 3. No fluid collections. 4. Portal veins grossly patent. Findings conveyed toCornett, MD on 05/04/2018  at05:40. Electronically Signed   By: Suzy Bouchard M.D.   On: 05/04/2018 05:40   Portable Chest X-ray  Result Date: 05/04/2018 Crista Luria, MD     05/04/2018  4:31 AM Intubation Procedure Note Francies Inch [id number] 1970/02/04 Procedure: Intubation Indications: Respiratory insufficiency Procedure Details Consent: Unable to obtain consent because of emergent medical necessity. Time Out: Verified patient identification, verified procedure, site/side was marked, verified correct patient position, special equipment/implants available,  medications/allergies/relevent history reviewed, required imaging and test results available.  Performed Maximum sterile technique was used including gloves, hand hygiene and mask. MAC Evaluation Hemodynamic Status: BP stable throughout; O2 sats: stable throughout Patient's Current Condition: stable Complications: No apparent complications Patient did tolerate procedure well. Chest X-ray ordered to verify placement.  CXR: pending. Oswald Hillock BIYAKI 05/04/2018    Anti-infectives: Anti-infectives (From admission, onward)   Start     Dose/Rate Route  Frequency Ordered Stop   05/04/18 2100  vancomycin (VANCOCIN) IVPB 750 mg/150 ml premix     750 mg 150 mL/hr over 60 Minutes Intravenous Every 12 hours 05/04/18 2027     05/03/18 1000  fluconazole (DIFLUCAN) IVPB 400 mg  Status:  Discontinued     400 mg 100 mL/hr over 120 Minutes Intravenous Every 24 hours 05/02/18 0721 05/02/18 0855   05/02/18 0800  vancomycin (VANCOCIN) 500 mg in sodium chloride 0.9 % 100 mL IVPB  Status:  Discontinued     500 mg 100 mL/hr over 60 Minutes Intravenous Every 12 hours 05/02/18 0721 05/04/18 2027   05/02/18 0800  piperacillin-tazobactam (ZOSYN) IVPB 3.375 g     3.375 g 12.5 mL/hr over 240 Minutes Intravenous Every 8 hours 05/02/18 0721     05/02/18 0730  fluconazole (DIFLUCAN) IVPB 800 mg     800 mg 200 mL/hr over 120 Minutes Intravenous  Once 05/02/18 0721 05/02/18 0952   04/30/18 2230  vancomycin (VANCOCIN) IVPB 750 mg/150 ml premix  Status:  Discontinued     750 mg 150 mL/hr over 60 Minutes Intravenous Every 48 hours 04/28/18 2133 04/29/18 1121   04/30/18 2230  vancomycin (VANCOCIN) IVPB 1000 mg/200 mL premix  Status:  Discontinued     1,000 mg 200 mL/hr over 60 Minutes Intravenous Every 48 hours 04/29/18 1121 04/30/18 0904   04/30/18 1000  vancomycin (VANCOCIN) IVPB 1000 mg/200 mL premix     1,000 mg 200 mL/hr over 60 Minutes Intravenous Every 24 hours 04/30/18 0904 05/01/18 1011   04/30/18 0915  ceFEPIme  (MAXIPIME) 2 g in sodium chloride 0.9 % 100 mL IVPB  Status:  Discontinued     2 g 200 mL/hr over 30 Minutes Intravenous Every 12 hours 04/30/18 0905 05/02/18 0721   04/28/18 2130  ceFEPIme (MAXIPIME) 2 g in sodium chloride 0.9 % 100 mL IVPB  Status:  Discontinued     2 g 200 mL/hr over 30 Minutes Intravenous Every 24 hours 04/28/18 2113 04/30/18 0905   04/28/18 2130  vancomycin (VANCOCIN) 1,250 mg in sodium chloride 0.9 % 250 mL IVPB     1,250 mg 166.7 mL/hr over 90 Minutes Intravenous  Once 04/28/18 2129 04/29/18 0107   04/25/18 1530  ceFAZolin (ANCEF) IVPB 2g/100 mL premix     2 g 200 mL/hr over 30 Minutes Intravenous Every 8 hours 04/25/18 1520 04/25/18 1739   04/25/18 0600  ceFAZolin (ANCEF) IVPB 2g/100 mL premix     2 g 200 mL/hr over 30 Minutes Intravenous On call to O.R. 04/25/18 0545 04/25/18 1149      Assessment/Plan: s/p Procedure(s) with comments: LAPAROSCOPY DIAGNOSTIC (N/A) - GENERAL AND EPIDURAL ANESTHESIA WHIPPLE PROCEDURE, RECONSTRUCTION OF PORTAL VEIN (N/A) -Con't intubation/sedation as per CCM -tol TFs, creon -abx, will send UA/Cx  -drain amylase pending   LOS: 10 days    Ralene Ok 05/05/2018

## 2018-05-06 LAB — BASIC METABOLIC PANEL
Anion gap: 12 (ref 5–15)
BUN: 10 mg/dL (ref 6–20)
CO2: 24 mmol/L (ref 22–32)
Calcium: 7.9 mg/dL — ABNORMAL LOW (ref 8.9–10.3)
Chloride: 110 mmol/L (ref 98–111)
Creatinine, Ser: 0.71 mg/dL (ref 0.44–1.00)
GFR calc Af Amer: >60 mL/min (ref >60)
GFR calc non Af Amer: >60 mL/min (ref >60)
Glucose, Bld: 126 mg/dL — ABNORMAL HIGH (ref 70–99)
Potassium: 3.9 mmol/L (ref 3.5–5.1)
Sodium: 146 mmol/L — ABNORMAL HIGH (ref 135–145)

## 2018-05-06 LAB — CBC
HCT: 24.6 % — ABNORMAL LOW (ref 36.0–46.0)
Hemoglobin: 7.7 g/dL — ABNORMAL LOW (ref 12.0–15.0)
MCH: 25.1 pg — ABNORMAL LOW (ref 26.0–34.0)
MCHC: 31.3 g/dL (ref 30.0–36.0)
MCV: 80.1 fL (ref 80.0–100.0)
Platelets: 221 10*3/uL (ref 150–400)
RBC: 3.07 MIL/uL — ABNORMAL LOW (ref 3.87–5.11)
RDW: 23.6 % — ABNORMAL HIGH (ref 11.5–15.5)
WBC: 14.2 10*3/uL — ABNORMAL HIGH (ref 4.0–10.5)
nRBC: 0 % (ref 0.0–0.2)

## 2018-05-06 LAB — GLUCOSE, CAPILLARY
Glucose-Capillary: 128 mg/dL — ABNORMAL HIGH (ref 70–99)
Glucose-Capillary: 111 mg/dL — ABNORMAL HIGH (ref 70–99)
Glucose-Capillary: 133 mg/dL — ABNORMAL HIGH (ref 70–99)
Glucose-Capillary: 116 mg/dL — ABNORMAL HIGH (ref 70–99)
Glucose-Capillary: 136 mg/dL — ABNORMAL HIGH (ref 70–99)
Glucose-Capillary: 153 mg/dL — ABNORMAL HIGH (ref 70–99)

## 2018-05-06 LAB — URINE CULTURE: Culture: NO GROWTH

## 2018-05-06 MED ORDER — ACETAMINOPHEN 325 MG PO TABS
650.0000 mg | ORAL_TABLET | Freq: Four times a day (QID) | ORAL | Status: DC | PRN
  Administered 2018-05-06 – 2018-05-08 (×5): 650 mg
  Filled 2018-05-06 (×5): qty 2

## 2018-05-06 MED ORDER — FUROSEMIDE 10 MG/ML IJ SOLN
40.0000 mg | Freq: Once | INTRAMUSCULAR | Status: AC
  Administered 2018-05-06: 40 mg via INTRAVENOUS

## 2018-05-06 MED ORDER — ACETAMINOPHEN 650 MG RE SUPP: 650.0000 mg | Freq: Four times a day (QID) | RECTAL | Status: DC | PRN

## 2018-05-06 MED ORDER — IPRATROPIUM-ALBUTEROL 0.5-2.5 (3) MG/3ML IN SOLN: 3.0000 mL | RESPIRATORY_TRACT | Status: DC | PRN

## 2018-05-06 MED ORDER — IPRATROPIUM BROMIDE 0.02 % IN SOLN
0.5000 mg | Freq: Four times a day (QID) | RESPIRATORY_TRACT | Status: DC
  Administered 2018-05-06 (×2): 0.5 mg via RESPIRATORY_TRACT
  Filled 2018-05-06 (×2): qty 2.5

## 2018-05-06 MED ORDER — LEVALBUTEROL HCL 0.63 MG/3ML IN NEBU
0.6300 mg | INHALATION_SOLUTION | Freq: Four times a day (QID) | RESPIRATORY_TRACT | Status: DC
  Administered 2018-05-06 (×2): 0.63 mg via RESPIRATORY_TRACT
  Filled 2018-05-06 (×2): qty 3

## 2018-05-06 MED ORDER — FUROSEMIDE 10 MG/ML IJ SOLN
INTRAMUSCULAR | Status: AC
  Administered 2018-05-06: 40 mg via INTRAVENOUS
  Filled 2018-05-06: qty 4

## 2018-05-06 NOTE — Progress Notes (Signed)
11 Days Post-Op   Subjective/Chief Complaint: Stable.  Doing pressure support trials.    Objective: Vital signs in last 24 hours: Temp:  [98.2 F (36.8 C)-101 F (38.3 C)] 100.9 F (38.3 C) (04/20 0800) Pulse Rate:  [112-136] 122 (04/20 1100) Resp:  [15-32] 18 (04/20 1100) BP: (123-171)/(61-93) 144/78 (04/20 1100) SpO2:  [97 %-100 %] 100 % (04/20 1100) FiO2 (%):  [30 %-40 %] 40 % (04/20 1103) Weight:  [62.6 kg] 62.6 kg (04/20 0500) Last BM Date: 05/06/18  Intake/Output from previous day: 04/19 0701 - 04/20 0700 In: 4778.7 [I.V.:1939.9; NG/GT:1080; IV Piggyback:1758.8] Out: 2805 [Urine:1650; Emesis/NG output:500; Drains:655] Intake/Output this shift: Total I/O In: 609.3 [I.V.:394.4; NG/GT:180; IV Piggyback:34.9] Out: 575 [Urine:375; Drains:200]  PE: Constitutional: No acute distress, intubated/sedated, appears states age. Eyes: Anicteric sclerae, moist conjunctiva Lungs: Normal respiratory effort CV: regular rate and rhythm GI: Soft, no masses or hepatosplenomegaly, non-tender to palpation, jtube in place, JP SS, inc cdi Skin: No rashes, palpation reveals normal turgor   Lab Results:  Recent Labs    05/05/18 0545 05/05/18 1738 05/06/18 0827  WBC 14.8*  --  14.2*  HGB 7.9* 8.8* 7.7*  HCT 26.0* 26.0* 24.6*  PLT 159  --  221   BMET Recent Labs    05/05/18 0545 05/05/18 1738 05/06/18 0827  NA 145 146* 146*  K 3.7 3.8 3.9  CL 111  --  110  CO2 26  --  24  GLUCOSE 165*  --  126*  BUN 9  --  10  CREATININE 0.73  --  0.71  CALCIUM 7.8*  --  7.9*   PT/INR No results for input(s): LABPROT, INR in the last 72 hours. ABG Recent Labs    05/05/18 1738  PHART 7.436  HCO3 25.0    Studies/Results: No results found.  Anti-infectives: Anti-infectives (From admission, onward)   Start     Dose/Rate Route Frequency Ordered Stop   05/04/18 2100  vancomycin (VANCOCIN) IVPB 750 mg/150 ml premix     750 mg 150 mL/hr over 60 Minutes Intravenous Every 12 hours  05/04/18 2027     05/03/18 1000  fluconazole (DIFLUCAN) IVPB 400 mg  Status:  Discontinued     400 mg 100 mL/hr over 120 Minutes Intravenous Every 24 hours 05/02/18 0721 05/02/18 0855   05/02/18 0800  vancomycin (VANCOCIN) 500 mg in sodium chloride 0.9 % 100 mL IVPB  Status:  Discontinued     500 mg 100 mL/hr over 60 Minutes Intravenous Every 12 hours 05/02/18 0721 05/04/18 2027   05/02/18 0800  piperacillin-tazobactam (ZOSYN) IVPB 3.375 g     3.375 g 12.5 mL/hr over 240 Minutes Intravenous Every 8 hours 05/02/18 0721     05/02/18 0730  fluconazole (DIFLUCAN) IVPB 800 mg     800 mg 200 mL/hr over 120 Minutes Intravenous  Once 05/02/18 0721 05/02/18 0952   04/30/18 2230  vancomycin (VANCOCIN) IVPB 750 mg/150 ml premix  Status:  Discontinued     750 mg 150 mL/hr over 60 Minutes Intravenous Every 48 hours 04/28/18 2133 04/29/18 1121   04/30/18 2230  vancomycin (VANCOCIN) IVPB 1000 mg/200 mL premix  Status:  Discontinued     1,000 mg 200 mL/hr over 60 Minutes Intravenous Every 48 hours 04/29/18 1121 04/30/18 0904   04/30/18 1000  vancomycin (VANCOCIN) IVPB 1000 mg/200 mL premix     1,000 mg 200 mL/hr over 60 Minutes Intravenous Every 24 hours 04/30/18 0904 05/01/18 1011   04/30/18 0915  ceFEPIme (MAXIPIME)  2 g in sodium chloride 0.9 % 100 mL IVPB  Status:  Discontinued     2 g 200 mL/hr over 30 Minutes Intravenous Every 12 hours 04/30/18 0905 05/02/18 0721   04/28/18 2130  ceFEPIme (MAXIPIME) 2 g in sodium chloride 0.9 % 100 mL IVPB  Status:  Discontinued     2 g 200 mL/hr over 30 Minutes Intravenous Every 24 hours 04/28/18 2113 04/30/18 0905   04/28/18 2130  vancomycin (VANCOCIN) 1,250 mg in sodium chloride 0.9 % 250 mL IVPB     1,250 mg 166.7 mL/hr over 90 Minutes Intravenous  Once 04/28/18 2129 04/29/18 0107   04/25/18 1530  ceFAZolin (ANCEF) IVPB 2g/100 mL premix     2 g 200 mL/hr over 30 Minutes Intravenous Every 8 hours 04/25/18 1520 04/25/18 1739   04/25/18 0600  ceFAZolin  (ANCEF) IVPB 2g/100 mL premix     2 g 200 mL/hr over 30 Minutes Intravenous On call to O.R. 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  1 LN positive and vein margin positive.       Neuro- Morphine gtt weaning.  PRN versed for sedation/analgesia.  Epidural out.  On enteral morphine and decreasing gtt.      Pulm/ID- reintubated for respiratory distress and increased WOB.  Zosyn/vanc should cover respiratory or GI pathogens.   Drain lipase very low.  Will pull drains soon.  They are still managing to diurese her a bit.  Fever likely secondary to liver necrosis and pneumonia. Continue coverage for pneumonia and biliary pathogens which include gram negs, anerobes, MRSA and enterococcus.    CV- tachycardia increased a bit with fever..  Pt young.  Appears reactive in nature. Staying off pressors.    GI-  Drains as expected, LFTs continue to trend down.  Duplex showed patent portal and hepatic veins.  Tube feeds at goal.  Consider repeating liver duplex.    Renal- cr now back to baseline.  Has diuresed quite a bit.  Stable I&O since admit at this point.  Will avoid contrast.    FEN: hypokalemia resolved.  Hypernatremia - mild and stable.    Endocrine- hyperglycemia, continue ssi, glucose under reasonable control. At risk of developing diabetes given amount of pancreas removed.  Will see how blood sugars settle out once acute stress improved.    Heme:  ABL anemia on anemia of chronic dx, anemia of chemotherapy.  Drifted down again today.    VTE ppx- SCDs, lovenox ppx  CCM managing vent/sedation/HCAP   Global:  weaning on vent.  Fever curve improving.  Hepatic necrosis and pneumonia.  Drains and scan show NO evidence of pancreatic leak.       LOS: 11 days    Stark Klein 05/06/2018

## 2018-05-06 NOTE — TOC Initial Note (Signed)
Transition of Care [facility]) - Initial/Assessment Note    Patient Details  Name: Jane Brooks MRN: [id number] Date of Birth: 1970/11/05  Transition of [facility]) CM/SW Contact:    Ella Bodo, RN Phone Number: 05/06/2018, 1:41 PM  Clinical Narrative:  48 y/o female with pancreatic adenocarcinoma s/p chemotherapy admitted for planned whipple on 4/9.  Pt extubated post op on 04/26/18, but developed respiratory distress secondary to HCAP and was reintubated on 05/01/2018.  PTA, pt independent of ADLS.  She has a sister in [place].   Pt currently remains intubated; will follow for discharge planning as pt progresses.  Expected Discharge Plan: [place] Barriers to Discharge: Continued Medical Work up          Expected Discharge Plan and Services Expected Discharge Plan: [place] arrangements for the past 2 months: Single Family Home                          Prior Living Arrangements/Services Living arrangements for the past 2 months: Single Family Home Lives with:: Self          Need for Family Participation in Patient Care: Yes (Comment) Care giver support system in place?: No (comment)          Emotional Assessment   Attitude/Demeanor/Rapport: Intubated (Following Commands or Not Following Commands) Affect (typically observed): Unable to Assess Orientation: : (Unable to assess due to intubation)      Admission diagnosis:  Pancreatic cancer Patient Active Problem List   Diagnosis Date Noted  . Acute respiratory failure with hypoxemia ([place]) 04/30/2018  . HCAP (healthcare-associated pneumonia) 04/30/2018  . Essential hypertension 04/30/2018  . Chronic prescription opiate use 04/30/2018  . Postoperative pain 04/30/2018  . Tachycardia 04/30/2018  . Tachypnea 04/30/2018  . Acute respiratory alkalosis 04/30/2018  . Adenocarcinoma of head of pancreas ([place]) 04/25/2018  . Genetic testing 01/24/2018  . Family  history of uterine cancer   . Family history of prostate cancer   . Family history of lung cancer   . Pancreatic cancer ([place]) 11/09/2017  . Pancreatic mass   . Cholecystitis 10/30/2017  . Jaundice 10/30/2017   PCP:  Patient, No Pcp Per Pharmacy:   [place], [place] - [address] [place] [address] Phone: [phone] Fax: [place], [place] - [place] [address] Phone: [phone] Fax: [phone]        Readmission Risk Interventions No flowsheet data found.  Reinaldo Raddle, RN, BSN  Trauma/Neuro ICU Case Manager [phone]

## 2018-05-06 NOTE — Progress Notes (Addendum)
NAME:  Jane Brooks, MRN:  [id number], DOB:  1970-01-21, LOS: 36 ADMISSION DATE:  04/25/2018, CONSULTATION DATE:  04/25/18 REFERRING MD:  Barry Dienes, MD CHIEF COMPLAINT:  Acute hypoxemic respiratory failure  Brief History   48 y/o female with pancreatic adenocarcinoma s/p chemotherapy admitted for planned whipple on 4/9. PCCM consulted for post-op hypotension, AKI and shock liver. Extubated on 4/10 however developed respiratory distress secondary to HCAP and was reintubated on 4/15.  Significant Hospital Events   4/09 Laparotomy 4/10 Respiratory distress 4/15 Re-intubated  Consults:  PCCM  Procedures:  ETT 4/9 >> 4/10 ETT 4/15 >>  Significant Diagnostic Tests:  CT A/P 4/3 >>  Panceratic mass with duct dilation CTA 4/9 >> no PE, s/p pancreas head mass removal, s/p pancreatic stent CT Chest/ABD/Pelvis 4/17 >>  PNA; low attenuation 9cm right lobe of liver MRI ABD 4/17 >> favors biliary necrosis.   Micro Data:  [place] 4/12 >> negative  BCX 4/16 >>  Tracheal aspirate 4/16 >> few candida UC 4/19 >> negative   Antimicrobials:  Cefazolin 4/9 x1 Cefepime 4/12 >> 4/16 Vanc 4/12 >> Zosyn 4/16 >>   Interim history/subjective:  RN reports ongoing diarrhea / flexiseal use.  On full support.  Morphine gtt infusing.  I/O - UOP 1.6L in last 24 hours, 1.9L+ for last 24 / 5L+ for admit.   Objective   Blood pressure (!) 143/75, pulse (!) 121, temperature (!) 100.9 F (38.3 C), resp. rate 17, height 4' 11"  (1.499 m), weight 62.6 kg, SpO2 100 %.    Vent Mode: PRVC FiO2 (%):  [30 %-40 %] 40 % Set Rate:  [18 bmp] 18 bmp Vt Set:  [340 mL] 340 mL PEEP:  [5 cmH20] 5 cmH20 Pressure Support:  [14 cmH20] 14 cmH20   Intake/Output Summary (Last 24 hours) at 05/06/2018 1020 Last data filed at 05/06/2018 1000 Gross per 24 hour  Intake 4804.28 ml  Output 3030 ml  Net 1774.28 ml   Filed Weights   05/03/18 0500 05/04/18 0500 05/06/18 0500  Weight: 62.1 kg 59.5 kg 62.6 kg    Physical Exam: General:  critically ill appearing adult female lying in bed in NAD HEENT: MM pink/moist, ETT Neuro: opens eyes to voice, no follow commands  CV: s1s2 rrr, no m/r/g PULM: even/non-labored, lungs bilaterally coarse  GI: midline incision with staples c/d/i, multiple drains Extremities: warm/dry, trace generalized edema  Skin: no rashes or lesions  Resolved Hospital Problem list   AKI Hypotension  Assessment & Plan:   Acute Hypoxemic Respiratory Failure  HCAP P: PRVC 8 cc/kg  Wean PEEP / FiO2 for sats >90% PSV as tolerated  Follow intermittent CXR  ABX as above  Lasix 40 mg IV x1   Pancreatic Head Cancer s/p Whipple P: Morphine gtt for pain  Enteral morphine titration to wean gtt  Monitor renal function closely  Management per CCS  TF per Nutrition   At Risk AKI Hypokalemia Hypophosphatemia P: Trend BMP / urinary output Replace electrolytes as indicated Avoid nephrotoxic agents, ensure adequate renal perfusion D51/2 NS with 40 mEq KCL at 29m/hr   Shock Liver -? bililary necrosis on MRI P: Follow LFT's  ? Of necrosis per CCS Follow INR  Best practice:  Diet: TF Pain/Anxiety/Delirium protocol (if indicated): Yes VAP protocol (if indicated): Yes DVT prophylaxis: lovenox GI prophylaxis: PPI Glucose control: SSI Mobility: BR Code Status: Full Family Communication: Update per primary Disposition: ICU  Labs   CBC: Recent Labs  Lab 05/03/18 0548 05/03/18 1813 05/04/18 08270  05/05/18 0545 05/05/18 1738 05/06/18 0827  WBC 18.6* 22.3* 21.7* 14.8*  --  14.2*  HGB 6.5* 8.0* 8.3* 7.9* 8.8* 7.7*  HCT 22.0* 26.4* 26.3* 26.0* 26.0* 24.6*  MCV 80.3 81.0 81.2 80.2  --  80.1  PLT 98* 145* 143* 159  --  886    Basic Metabolic Panel: Recent Labs  Lab 04/30/18 0547  05/01/18 0437 05/02/18 0447 05/03/18 0548 05/04/18 0915 05/05/18 0545 05/05/18 1738 05/06/18 0827  NA 147*   < > 142 145 146* 148* 145 146* 146*  K 3.1*   < > 3.3* 3.3* 4.8 4.0 3.7 3.8 3.9  CL 112*   --  110 113* 115* 112* 111  --  110  CO2 24  --  22 21* 21* 24 26  --  24  GLUCOSE 119*  --  158* 145* 142* 111* 165*  --  126*  BUN 20  --  16 14 16 11 9   --  10  CREATININE 1.01*  --  0.96 0.85 1.03* 0.82 0.73  --  0.71  CALCIUM 7.9*  --  7.2* 7.8* 7.6* 8.2* 7.8*  --  7.9*  MG 1.7  --   --   --   --   --   --   --   --   PHOS 2.2*  --  3.2 2.4*  --   --   --   --   --    < > = values in this interval not displayed.   GFR: Estimated Creatinine Clearance: 68.5 mL/min (by C-G formula based on SCr of 0.71 mg/dL). Recent Labs  Lab 05/03/18 1813 05/04/18 0523 05/05/18 0545 05/06/18 0827  WBC 22.3* 21.7* 14.8* 14.2*    Liver Function Tests: Recent Labs  Lab 05/01/18 0437 05/02/18 0447 05/03/18 0548 05/04/18 0915 05/05/18 0545  AST 64* 48* 45* 34 25  ALT 265* 160* 96* 71* 48*  ALKPHOS 111 105 89 93 83  BILITOT 2.1* 2.3* 2.2* 2.2* 1.5*  PROT 4.7* 5.6* 5.8* 5.9* 5.8*  ALBUMIN 2.4* 2.8* 2.4* 2.4* 2.0*   No results for input(s): LIPASE, AMYLASE in the last 168 hours. No results for input(s): AMMONIA in the last 168 hours.  ABG    Component Value Date/Time   PHART 7.436 05/05/2018 1738   PCO2ART 37.1 05/05/2018 1738   PO2ART 71.0 (L) 05/05/2018 1738   HCO3 25.0 05/05/2018 1738   TCO2 26 05/05/2018 1738   ACIDBASEDEF 10.7 (H) 04/25/2018 2119   O2SAT 95.0 05/05/2018 1738     Coagulation Profile: Recent Labs  Lab 05/01/18 0437 05/02/18 0447 05/03/18 0548  INR 1.7* 1.7* 1.7*    Cardiac Enzymes: No results for input(s): CKTOTAL, CKMB, CKMBINDEX, TROPONINI in the last 168 hours.  HbA1C: No results found for: HGBA1C  CBG: Recent Labs  Lab 05/05/18 1549 05/05/18 1911 05/05/18 2315 05/06/18 0304 05/06/18 0743  GLUCAP 128* 160* 138* 128* 111*    Critical care time: 30 minutes    Noe Gens, NP-C [facility] Pgr: [phone] or if no answer [phone] 05/06/2018, 10:21 AM

## 2018-05-07 ENCOUNTER — Inpatient Hospital Stay (HOSPITAL_COMMUNITY): Payer: Medicaid Other

## 2018-05-07 LAB — CULTURE, BLOOD (ROUTINE X 2)
Culture: NO GROWTH
Culture: NO GROWTH
Special Requests: ADEQUATE
Special Requests: ADEQUATE

## 2018-05-07 LAB — BASIC METABOLIC PANEL
Anion gap: 10 (ref 5–15)
BUN: 14 mg/dL (ref 6–20)
CO2: 27 mmol/L (ref 22–32)
Calcium: 7.9 mg/dL — ABNORMAL LOW (ref 8.9–10.3)
Chloride: 108 mmol/L (ref 98–111)
Creatinine, Ser: 0.89 mg/dL (ref 0.44–1.00)
GFR calc Af Amer: >60 mL/min (ref >60)
GFR calc non Af Amer: >60 mL/min (ref >60)
Glucose, Bld: 140 mg/dL — ABNORMAL HIGH (ref 70–99)
Potassium: 3.6 mmol/L (ref 3.5–5.1)
Sodium: 145 mmol/L (ref 135–145)

## 2018-05-07 LAB — GLUCOSE, CAPILLARY
Glucose-Capillary: 127 mg/dL — ABNORMAL HIGH (ref 70–99)
Glucose-Capillary: 118 mg/dL — ABNORMAL HIGH (ref 70–99)
Glucose-Capillary: 149 mg/dL — ABNORMAL HIGH (ref 70–99)
Glucose-Capillary: 121 mg/dL — ABNORMAL HIGH (ref 70–99)
Glucose-Capillary: 135 mg/dL — ABNORMAL HIGH (ref 70–99)
Glucose-Capillary: 133 mg/dL — ABNORMAL HIGH (ref 70–99)

## 2018-05-07 LAB — CBC
HCT: 23.6 % — ABNORMAL LOW (ref 36.0–46.0)
Hemoglobin: 7.5 g/dL — ABNORMAL LOW (ref 12.0–15.0)
MCH: 25.2 pg — ABNORMAL LOW (ref 26.0–34.0)
MCHC: 31.8 g/dL (ref 30.0–36.0)
MCV: 79.2 fL — ABNORMAL LOW (ref 80.0–100.0)
Platelets: 214 10*3/uL (ref 150–400)
RBC: 2.98 MIL/uL — ABNORMAL LOW (ref 3.87–5.11)
WBC: 12 10*3/uL — ABNORMAL HIGH (ref 4.0–10.5)
nRBC: 0 % (ref 0.0–0.2)

## 2018-05-07 NOTE — Progress Notes (Signed)
Pharmacy Antibiotic Note  Jane Brooks is a 48 y.o. female with pancreatic cancer, s/p Whipple on 4/9. On day # 6 Vancomycin and Zosyn for recurrent fevers, pneumonia, sepsis and intra-abdominal coverage.   Tmax 102, WBC down to 12.0.  Fevers thought due to liver necrosis and pneumonia. Has 2 abdominal drains.       Vanc levels done on 4/18 and dose adjusted.  Creatinine fluctuating but regimen remains appropriate. UOP good.  Plan:  Continue Vancomcyin 750 mg IV q12h  Continue Zosyn 3.375 gm IV q8hrs (each over 4 hours).  Follow renal function, antibiotic plans.  Repeat Vanc levels if indicated.  Goal AUC 400-550.  Expected AUC: 472   Height: 4' 11"  (149.9 cm) Weight: 138 lb 0.1 oz (62.6 kg) IBW/kg (Calculated) : 43.2  Temp (24hrs), Avg:100.4 F (38 C), Min:98.6 F (37 C), Max:102 F (38.9 C)  Recent Labs  Lab 05/03/18 0548 05/03/18 1813 05/04/18 0523 05/04/18 0915 05/04/18 1045 05/04/18 1950 05/05/18 0545 05/06/18 0827 05/07/18 0506  WBC 18.6* 22.3* 21.7*  --   --   --  14.8* 14.2* 12.0*  CREATININE 1.03*  --   --  0.82  --   --  0.73 0.71 0.89  VANCOTROUGH  --   --   --   --   --  8*  --   --   --   VANCOPEAK  --   --   --   --  19*  --   --   --   --     Estimated Creatinine Clearance: 61.6 mL/min (by C-G formula based on SCr of 0.89 mg/dL).    Allergies  Allergen Reactions  . Latex Hives    Antimicrobials this admission: Cefazolin pre-op 4/9 Cefepime 4/12 >>4/15 Vanc 4/12 >>4/15 - resumed 4/16 >> Zosyn 4/16 >> Fluconazole 4/15 >> 4/16  Dose adjustments this admission:  4/18 peak 19, trough 8 on 500 mg IV q12h, calc AUC 315 > inc 725m q12h for est AUC 472  Microbiology results: 4/16 tracheal aspirate - few Candida albicans 4/16 blood x 2 - negative 4/16 urine - negative 4/12 blood x 2 - negative 4/9 MRSA PCR negative  Thank you for allowing pharmacy to be a part of this patient's care.  EArty Baumgartner RNorth CarolinaPager: [phone] phone:  [phone]/21/2020 11:33 AM

## 2018-05-07 NOTE — Progress Notes (Signed)
Pt's sister Verne Grain updated over the phone. She requested to speak to the MD as well, and that message was passed along.

## 2018-05-07 NOTE — Progress Notes (Signed)
NAME:  Jane Brooks, MRN:  [id number], DOB:  1970/03/05, LOS: 58 ADMISSION DATE:  04/25/2018, CONSULTATION DATE:  04/25/18 REFERRING MD:  Barry Dienes, MD CHIEF COMPLAINT:  Acute hypoxemic respiratory failure  Brief History   48 y/o female with pancreatic adenocarcinoma s/p chemotherapy admitted for planned whipple on 4/9. PCCM consulted for post-op hypotension, AKI and shock liver. Extubated on 4/10 however developed respiratory distress secondary to HCAP and was reintubated on 4/15.  Significant Hospital Events   4/09 Laparotomy 4/10 Respiratory distress 4/15 Re-intubated 4/20 Some PSV 10/5,  Morphine gtt infusing.    Consults:  PCCM  Procedures:  ETT 4/9 >> 4/10 ETT 4/15 >>  Significant Diagnostic Tests:  CT A/P 4/3 >>  Panceratic mass with duct dilation CTA 4/9 >> no PE, s/p pancreas head mass removal, s/p pancreatic stent CT Chest/ABD/Pelvis 4/17 >>  PNA; low attenuation 9cm right lobe of liver MRI ABD 4/17 >> favors biliary necrosis.   Micro Data:  [place] 4/12 >> negative  BCX 4/16 >>  Tracheal aspirate 4/16 >> few candida UC 4/19 >> negative   Antimicrobials:  Cefazolin 4/9 x1 Cefepime 4/12 >> 4/16 Vanc 4/12 >> Zosyn 4/16 >>   Interim history/subjective:  No acute events overnight.  Tmax 101.  I/O- 3.2L UOP with lasix yesterday, net net 1.7L in last 24 hours   Objective   Blood pressure 135/70, pulse (!) 117, temperature (!) 101 F (38.3 C), temperature source Axillary, resp. rate 20, height 4' 11"  (1.499 m), weight 62.6 kg, SpO2 100 %.    Vent Mode: CPAP;PSV FiO2 (%):  [40 %] 40 % Set Rate:  [18 bmp] 18 bmp Vt Set:  [340 mL] 340 mL PEEP:  [5 cmH20] 5 cmH20 Pressure Support:  [5 cmH20-10 cmH20] 5 cmH20 Plateau Pressure:  [14 cmH20-15 cmH20] 15 cmH20   Intake/Output Summary (Last 24 hours) at 05/07/2018 1016 Last data filed at 05/07/2018 0600 Gross per 24 hour  Intake 2387 ml  Output 4485 ml  Net -2098 ml   Filed Weights   05/04/18 0500 05/06/18 0500 05/07/18  0410  Weight: 59.5 kg 62.6 kg 62.6 kg    Physical Exam: General: adult female lying in bed on vent, critically ill appearing  HEENT: MM pink/moist, ETT Neuro: eyes open, no tracking, no follow commands CV: s1s2 rrr, no m/r/g PULM: even/non-labored, lungs bilaterally coarse with rhonchi  GI: midline abd incision c/d/i, staples intact, multiple drains Extremities: warm/dry, trace to 1+ edema  Skin: no rashes or lesions  Resolved Hospital Problem list   AKI Hypotension  Assessment & Plan:   Acute Hypoxemic Respiratory Failure  HCAP P: PRVC as rest mode Daily PSV attempts  Follow intermittent CXR  Consider lasix dosing again 4/22   Pancreatic Head Cancer s/p Whipple P: Per CCS  Reduce ceiling on morphine gtt to 67m  Continue enteral morphine titration to wean gtt to off  Monitor renal function closely with narcotics  TF per Nutrition   At Risk AKI Hypokalemia Hypophosphatemia P: D51/2NS with 40 mEq KCL at 40 ml/hr  Trend BMP / urinary output Replace electrolytes as indicated Avoid nephrotoxic agents, ensure adequate renal perfusion  Shock Liver -? bililary necrosis on MRI P: Trend LFT's  Follow INR   Best practice:  Diet: TF Pain/Anxiety/Delirium protocol (if indicated): Yes VAP protocol (if indicated): Yes DVT prophylaxis: lovenox GI prophylaxis: PPI Glucose control: SSI Mobility: BR Code Status: Full Family Communication: Update per primary Disposition: ICU  Labs   CBC: Recent Labs  Lab 05/03/18  9604 05/04/18 0523 05/05/18 0545 05/05/18 1738 05/06/18 0827 05/07/18 0506  WBC 22.3* 21.7* 14.8*  --  14.2* 12.0*  HGB 8.0* 8.3* 7.9* 8.8* 7.7* 7.5*  HCT 26.4* 26.3* 26.0* 26.0* 24.6* 23.6*  MCV 81.0 81.2 80.2  --  80.1 79.2*  PLT 145* 143* 159  --  221 540    Basic Metabolic Panel: Recent Labs  Lab 05/01/18 0437 05/02/18 0447 05/03/18 0548 05/04/18 0915 05/05/18 0545 05/05/18 1738 05/06/18 0827 05/07/18 0506  NA 142 145 146* 148* 145  146* 146* 145  K 3.3* 3.3* 4.8 4.0 3.7 3.8 3.9 3.6  CL 110 113* 115* 112* 111  --  110 108  CO2 22 21* 21* 24 26  --  24 27  GLUCOSE 158* 145* 142* 111* 165*  --  126* 140*  BUN 16 14 16 11 9   --  10 14  CREATININE 0.96 0.85 1.03* 0.82 0.73  --  0.71 0.89  CALCIUM 7.2* 7.8* 7.6* 8.2* 7.8*  --  7.9* 7.9*  PHOS 3.2 2.4*  --   --   --   --   --   --    GFR: Estimated Creatinine Clearance: 61.6 mL/min (by C-G formula based on SCr of 0.89 mg/dL). Recent Labs  Lab 05/04/18 0523 05/05/18 0545 05/06/18 0827 05/07/18 0506  WBC 21.7* 14.8* 14.2* 12.0*    Liver Function Tests: Recent Labs  Lab 05/01/18 0437 05/02/18 0447 05/03/18 0548 05/04/18 0915 05/05/18 0545  AST 64* 48* 45* 34 25  ALT 265* 160* 96* 71* 48*  ALKPHOS 111 105 89 93 83  BILITOT 2.1* 2.3* 2.2* 2.2* 1.5*  PROT 4.7* 5.6* 5.8* 5.9* 5.8*  ALBUMIN 2.4* 2.8* 2.4* 2.4* 2.0*   No results for input(s): LIPASE, AMYLASE in the last 168 hours. No results for input(s): AMMONIA in the last 168 hours.  ABG    Component Value Date/Time   PHART 7.436 05/05/2018 1738   PCO2ART 37.1 05/05/2018 1738   PO2ART 71.0 (L) 05/05/2018 1738   HCO3 25.0 05/05/2018 1738   TCO2 26 05/05/2018 1738   ACIDBASEDEF 10.7 (H) 04/25/2018 2119   O2SAT 95.0 05/05/2018 1738     Coagulation Profile: Recent Labs  Lab 05/01/18 0437 05/02/18 0447 05/03/18 0548  INR 1.7* 1.7* 1.7*    Cardiac Enzymes: No results for input(s): CKTOTAL, CKMB, CKMBINDEX, TROPONINI in the last 168 hours.  HbA1C: No results found for: HGBA1C  CBG: Recent Labs  Lab 05/06/18 1545 05/06/18 1933 05/06/18 2313 05/07/18 0303 05/07/18 0732  GLUCAP 116* 136* 153* 127* 118*    Critical care time: 30 minutes    Noe Gens, NP-C [facility] Pgr: [phone] or if no answer [phone] 05/07/2018, 10:16 AM

## 2018-05-07 NOTE — Progress Notes (Signed)
Nutrition Follow-up  RD working remotely.  DOCUMENTATION CODES:   Not applicable  INTERVENTION:   Continue  Osmolite 1.5 @ 41 ml/hr via J-tube 30 ml Prostat BID  Tube feeding regimen provides 1676 kcal (106% of needs), 91 grams of protein, and 749 ml of H2O.   NUTRITION DIAGNOSIS:   Increased nutrient needs related to cancer and cancer related treatments as evidenced by estimated needs.  Ongoing  GOAL:   Patient will meet greater than or equal to 90% of their needs  Progressing  MONITOR:   PO intake, Diet advancement, Labs, Weight trends, TF tolerance, Skin, I & O's  REASON FOR ASSESSMENT:   Consult Enteral/tube feeding initiation and management  ASSESSMENT:   48 year old female who presented on 4/09 for diagnostic laparoscopy, pancreaticoduodenectomy, portal vein reconstruction, placement of pancreatic stent, and placement of J-tube. PMH of adenoma of the pancreatic head and had been receiving chemotherapy.  4/9 s/p whipple 4/10 pt self extubated, NG tube replaced, j-tube placed 4/11 rectal tube removed 4/13 NGT d/c, advanced to clear liquids 4/14 TF advanced to goal 4/15 pt intubated early this am, noted pt with PNA  Patient is currently intubated on ventilator support MV: 8.3 L/min Temp (24hrs), Avg:100.3 F (37.9 C), Min:98.6 F (37 C), Max:101.1 F (38.4 C)  Drain x 2 output:  1: 360 ml  2: 100 ml   Medications reviewed and include: creon D5 1/2 NS @ 40 ml/hr Labs reviewed CBGS: 118-149 (inpatient orders for glycemic control are 0-9 units insulin aspart every 4 hours).   Diet Order:   Diet Order            Diet NPO time specified  Diet effective now              EDUCATION NEEDS:   Not appropriate for education at this time  Skin:  Skin Assessment: Skin Integrity Issues: Skin Integrity Issues:: Incisions Incisions: closed abdomen  Last BM:  95 ml via rectal tube  Height:   Ht Readings from Last 1 Encounters:  04/25/18 4' 11"   (1.499 m)    Weight:   Wt Readings from Last 1 Encounters:  05/07/18 62.6 kg    Ideal Body Weight:  43.2 kg  BMI:  Body mass index is 27.87 kg/m.  Estimated Nutritional Needs:   Kcal:  1577  Protein:  75-100 grams   Fluid:  > 1.7 L/day   Maylon Peppers RD, LDN, CNSC [phone] Pager [phone] After Hours Pager

## 2018-05-07 NOTE — Progress Notes (Signed)
12 Days Post-Op   Subjective/Chief Complaint: Did PSV most of day yesterday.  Had another temp to 102.    Objective: Vital signs in last 24 hours: Temp:  [98.6 F (37 C)-102 F (38.9 C)] 100.3 F (37.9 C) (04/21 0400) Pulse Rate:  [114-136] 117 (04/21 0600) Resp:  [17-29] 20 (04/21 0600) BP: (133-171)/(70-93) 135/70 (04/21 0600) SpO2:  [99 %-100 %] 100 % (04/21 0600) FiO2 (%):  [40 %] 40 % (04/21 0405) Weight:  [62.6 kg] 62.6 kg (04/21 0410) Last BM Date: 05/06/18  Intake/Output from previous day: 04/20 0701 - 04/21 0700 In: 2956 [I.V.:1237.6; NG/GT:1180; IV Piggyback:538.5] Out: 4710 [Urine:3280; Emesis/NG output:875; Drains:460; Stool:95] Intake/Output this shift: No intake/output data recorded.  PE: Constitutional: No acute distress, intubated/sedated, opens eyes to voice.  Appears states age. Eyes: Anicteric sclerae, moist conjunctiva Lungs: Normal respiratory effort on vent.  synchronous CV: regular rhythm, tachycardic.   GI: Soft, no masses or hepatosplenomegaly, non-tender to palpation, jtube in place, JP SS, inc cdi Skin: No rashes, palpation reveals normal turgor   Lab Results:  Recent Labs    05/06/18 0827 05/07/18 0506  WBC 14.2* 12.0*  HGB 7.7* 7.5*  HCT 24.6* 23.6*  PLT 221 214   BMET Recent Labs    05/06/18 0827 05/07/18 0506  NA 146* 145  K 3.9 3.6  CL 110 108  CO2 24 27  GLUCOSE 126* 140*  BUN 10 14  CREATININE 0.71 0.89  CALCIUM 7.9* 7.9*   PT/INR No results for input(s): LABPROT, INR in the last 72 hours. ABG Recent Labs    05/05/18 1738  PHART 7.436  HCO3 25.0    Studies/Results: No results found.  Anti-infectives: Anti-infectives (From admission, onward)   Start     Dose/Rate Route Frequency Ordered Stop   05/04/18 2100  vancomycin (VANCOCIN) IVPB 750 mg/150 ml premix     750 mg 150 mL/hr over 60 Minutes Intravenous Every 12 hours 05/04/18 2027     05/03/18 1000  fluconazole (DIFLUCAN) IVPB 400 mg  Status:  Discontinued      400 mg 100 mL/hr over 120 Minutes Intravenous Every 24 hours 05/02/18 0721 05/02/18 0855   05/02/18 0800  vancomycin (VANCOCIN) 500 mg in sodium chloride 0.9 % 100 mL IVPB  Status:  Discontinued     500 mg 100 mL/hr over 60 Minutes Intravenous Every 12 hours 05/02/18 0721 05/04/18 2027   05/02/18 0800  piperacillin-tazobactam (ZOSYN) IVPB 3.375 g     3.375 g 12.5 mL/hr over 240 Minutes Intravenous Every 8 hours 05/02/18 0721     05/02/18 0730  fluconazole (DIFLUCAN) IVPB 800 mg     800 mg 200 mL/hr over 120 Minutes Intravenous  Once 05/02/18 0721 05/02/18 0952   04/30/18 2230  vancomycin (VANCOCIN) IVPB 750 mg/150 ml premix  Status:  Discontinued     750 mg 150 mL/hr over 60 Minutes Intravenous Every 48 hours 04/28/18 2133 04/29/18 1121   04/30/18 2230  vancomycin (VANCOCIN) IVPB 1000 mg/200 mL premix  Status:  Discontinued     1,000 mg 200 mL/hr over 60 Minutes Intravenous Every 48 hours 04/29/18 1121 04/30/18 0904   04/30/18 1000  vancomycin (VANCOCIN) IVPB 1000 mg/200 mL premix     1,000 mg 200 mL/hr over 60 Minutes Intravenous Every 24 hours 04/30/18 0904 05/01/18 1011   04/30/18 0915  ceFEPIme (MAXIPIME) 2 g in sodium chloride 0.9 % 100 mL IVPB  Status:  Discontinued     2 g 200 mL/hr over 30  Minutes Intravenous Every 12 hours 04/30/18 0905 05/02/18 0721   04/28/18 2130  ceFEPIme (MAXIPIME) 2 g in sodium chloride 0.9 % 100 mL IVPB  Status:  Discontinued     2 g 200 mL/hr over 30 Minutes Intravenous Every 24 hours 04/28/18 2113 04/30/18 0905   04/28/18 2130  vancomycin (VANCOCIN) 1,250 mg in sodium chloride 0.9 % 250 mL IVPB     1,250 mg 166.7 mL/hr over 90 Minutes Intravenous  Once 04/28/18 2129 04/29/18 0107   04/25/18 1530  ceFAZolin (ANCEF) IVPB 2g/100 mL premix     2 g 200 mL/hr over 30 Minutes Intravenous Every 8 hours 04/25/18 1520 04/25/18 1739   04/25/18 0600  ceFAZolin (ANCEF) IVPB 2g/100 mL premix     2 g 200 mL/hr over 30 Minutes Intravenous On call to O.R.  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  1 LN positive and vein margin positive.       Neuro- Morphine gtt weaning.  PRN versed for sedation/analgesia.  Epidural out.  On enteral morphine and decreasing gtt.    Alert periodically, but not always following commands.  Pulm/ID- reintubated for respiratory distress and increased WOB.  Zosyn/vanc should cover respiratory or GI pathogens.   Drain lipase very low.  Will pull drains soon.  They are still managing to diurese her a bit.  Fever likely secondary to liver necrosis and pneumonia. Continue coverage for pneumonia and biliary pathogens which include gram negs, anerobes, MRSA and enterococcus.    CV- tachycardia increased a bit with fever..  Pt young.  Appears reactive in nature. Staying off pressors.    GI-  Drains as expected, LFTs continue to trend down.  Duplex showed patent portal and hepatic veins.  Tube feeds at goal.  Consider repeating liver duplex.    Renal- cr now back to baseline.  Has diuresed quite a bit.  Stable I&O since admit at this point.  Will avoid contrast.    FEN: hypokalemia resolved.  Hypernatremia - resolved.    Endocrine- hyperglycemia, continue ssi, glucose under reasonable control. At risk of developing diabetes given amount of pancreas removed.  Will see how blood sugars settle out once acute stress improved.    Heme:  ABL anemia on anemia of chronic dx, anemia of chemotherapy.  Drifted down again today.    VTE ppx- SCDs, lovenox ppx  CCM managing vent/sedation/HCAP   Global:  weaning on vent.   Hepatic necrosis and pneumonia.  Drains and scan show NO evidence of pancreatic leak, but patient diuresing via drains.  Repeat imaging later this week.  Remove  staples this week.      LOS: 12 days    Stark Klein 05/07/2018

## 2018-05-08 LAB — GLUCOSE, CAPILLARY
Glucose-Capillary: 119 mg/dL — ABNORMAL HIGH (ref 70–99)
Glucose-Capillary: 122 mg/dL — ABNORMAL HIGH (ref 70–99)
Glucose-Capillary: 156 mg/dL — ABNORMAL HIGH (ref 70–99)
Glucose-Capillary: 124 mg/dL — ABNORMAL HIGH (ref 70–99)
Glucose-Capillary: 121 mg/dL — ABNORMAL HIGH (ref 70–99)
Glucose-Capillary: 144 mg/dL — ABNORMAL HIGH (ref 70–99)

## 2018-05-08 LAB — COMPREHENSIVE METABOLIC PANEL
ALT: 29 U/L (ref 0–44)
AST: 25 U/L (ref 15–41)
Albumin: 2 g/dL — ABNORMAL LOW (ref 3.5–5.0)
Alkaline Phosphatase: 91 U/L (ref 38–126)
Anion gap: 11 (ref 5–15)
BUN: 16 mg/dL (ref 6–20)
CO2: 26 mmol/L (ref 22–32)
Calcium: 8.1 mg/dL — ABNORMAL LOW (ref 8.9–10.3)
Chloride: 110 mmol/L (ref 98–111)
Creatinine, Ser: 1.02 mg/dL — ABNORMAL HIGH (ref 0.44–1.00)
GFR calc Af Amer: >60 mL/min (ref >60)
GFR calc non Af Amer: >60 mL/min (ref >60)
Glucose, Bld: 147 mg/dL — ABNORMAL HIGH (ref 70–99)
Potassium: 4 mmol/L (ref 3.5–5.1)
Sodium: 147 mmol/L — ABNORMAL HIGH (ref 135–145)
Total Bilirubin: 1.1 mg/dL (ref 0.3–1.2)
Total Protein: 6.6 g/dL (ref 6.5–8.1)

## 2018-05-08 LAB — CBC
HCT: 24.8 % — ABNORMAL LOW (ref 36.0–46.0)
Hemoglobin: 7.7 g/dL — ABNORMAL LOW (ref 12.0–15.0)
MCH: 24.8 pg — ABNORMAL LOW (ref 26.0–34.0)
MCHC: 31 g/dL (ref 30.0–36.0)
MCV: 79.7 fL — ABNORMAL LOW (ref 80.0–100.0)
Platelets: 263 10*3/uL (ref 150–400)
RBC: 3.11 MIL/uL — ABNORMAL LOW (ref 3.87–5.11)
WBC: 14.2 10*3/uL — ABNORMAL HIGH (ref 4.0–10.5)
nRBC: 0 % (ref 0.0–0.2)

## 2018-05-08 MED ORDER — POTASSIUM CHLORIDE 20 MEQ/15ML (10%) PO SOLN
40.0000 meq | Freq: Once | ORAL | Status: AC
  Administered 2018-05-08: 11:00:00 40 meq
  Filled 2018-05-08: qty 30

## 2018-05-08 MED ORDER — FREE WATER
200.0000 mL | Freq: Three times a day (TID) | Status: DC
  Administered 2018-05-08 – 2018-05-09 (×3): 200 mL

## 2018-05-08 MED ORDER — FUROSEMIDE 10 MG/ML IJ SOLN
40.0000 mg | Freq: Once | INTRAMUSCULAR | Status: AC
  Administered 2018-05-08: 11:00:00 40 mg via INTRAVENOUS
  Filled 2018-05-08: qty 4

## 2018-05-08 NOTE — Progress Notes (Signed)
Pt RR 34-36 bpm, increased psv to 10, RR decreased to 24bpm.  RN aware.

## 2018-05-08 NOTE — Progress Notes (Signed)
NAME:  Jane Brooks, MRN:  [id number], DOB:  04/06/1970, LOS: 55 ADMISSION DATE:  04/25/2018, CONSULTATION DATE:  04/25/18 REFERRING MD:  Barry Dienes, MD CHIEF COMPLAINT:  Acute hypoxemic respiratory failure  Brief History   48 y/o female with pancreatic adenocarcinoma s/p chemotherapy admitted for planned whipple on 4/9. PCCM consulted for post-op hypotension, AKI and shock liver. Extubated on 4/10 however developed respiratory distress secondary to HCAP and was reintubated on 4/15.  Significant Hospital Events   4/09 Laparotomy 4/10 Respiratory distress 4/15 Re-intubated 4/20 Some PSV 10/5,  Morphine gtt infusing.    Consults:  PCCM  Procedures:  ETT 4/9 >> 4/10 ETT 4/15 >>  Significant Diagnostic Tests:  CT A/P 4/3 >>  Panceratic mass with duct dilation CTA 4/9 >> no PE, s/p pancreas head mass removal, s/p pancreatic stent CT Chest/ABD/Pelvis 4/17 >>  PNA; low attenuation 9cm right lobe of liver MRI ABD 4/17 >> favors biliary necrosis.   Micro Data:  Merino 4/12 >> negative  BCX 4/16 >>  Tracheal aspirate 4/16 >> few candida UC 4/19 >> negative   Antimicrobials:  Cefazolin 4/9 x1 Cefepime 4/12 >> 4/16 Vanc 4/12 >> Zosyn 4/16 >>   Interim history/subjective:  RN reports pt remains on morphine gtt, no acute events overnight.   Objective   Blood pressure 123/70, pulse (!) 110, temperature 99.6 F (37.6 C), temperature source Axillary, resp. rate 18, height 4' 11"  (1.499 m), weight 62.6 kg, SpO2 99 %.    Vent Mode: CPAP;PSV FiO2 (%):  [30 %-40 %] 30 % Set Rate:  [18 bmp] 18 bmp Vt Set:  [340 mL] 340 mL PEEP:  [5 cmH20] 5 cmH20 Pressure Support:  [5 cmH20-8 cmH20] 5 cmH20 Plateau Pressure:  [9 cmH20-18 cmH20] 18 cmH20   Intake/Output Summary (Last 24 hours) at 05/08/2018 0950 Last data filed at 05/08/2018 0800 Gross per 24 hour  Intake 2700.39 ml  Output 2895 ml  Net -194.61 ml   Filed Weights   05/06/18 0500 05/07/18 0410 05/08/18 0500  Weight: 62.6 kg 62.6 kg 62.6  kg    Physical Exam: General: adult female lying in bed, ill appearing   HEENT: MM pink/moist, ETT Neuro: eyes open, does not follow commands CV: s1s2 rrr, no m/r/g PULM: even/non-labored, lungs bilaterally clear  FB:PZWC, non-tender, bsx4 active  Extremities: warm/dry, trace edema  Skin: no rashes or lesions  Resolved Hospital Problem list   AKI Hypotension  Assessment & Plan:   Acute Hypoxemic Respiratory Failure  HCAP P: PRVC 8cc/kg as rest mode Daily PSV attempt  Follow intermittent CXR  Repeat lasix 59m IV x1  Pancreatic Head Cancer s/p Whipple P: Per CCS Continue enteral morphine  Monitor renal function closely with narcotics TF per nutrition   At Risk AKI Hypokalemia Hypophosphatemia Hypernatremia  P: Add free water 200 ml Q8 Trend BMP / urinary output Replace electrolytes as indicated Avoid nephrotoxic agents, ensure adequate renal perfusion D51/2NS with 477m KCL at 4010mr   Shock Liver -? bililary necrosis on MRI P: Trend LFT's, INR    Best practice:  Diet: TF Pain/Anxiety/Delirium protocol (if indicated): Yes VAP protocol (if indicated): Yes DVT prophylaxis: lovenox GI prophylaxis: PPI Glucose control: SSI Mobility: BR Code Status: Full Family Communication: Per primary  Disposition: ICU  Labs   CBC: Recent Labs  Lab 05/04/18 0523 05/05/18 0545 05/05/18 1738 05/06/18 0827 05/07/18 0506 05/08/18 0521  WBC 21.7* 14.8*  --  14.2* 12.0* 14.2*  HGB 8.3* 7.9* 8.8* 7.7* 7.5* 7.7*  HCT 26.3*  26.0* 26.0* 24.6* 23.6* 24.8*  MCV 81.2 80.2  --  80.1 79.2* 79.7*  PLT 143* 159  --  221 214 956    Basic Metabolic Panel: Recent Labs  Lab 05/02/18 0447  05/04/18 0915 05/05/18 0545 05/05/18 1738 05/06/18 0827 05/07/18 0506 05/08/18 0521  NA 145   < > 148* 145 146* 146* 145 147*  K 3.3*   < > 4.0 3.7 3.8 3.9 3.6 4.0  CL 113*   < > 112* 111  --  110 108 110  CO2 21*   < > 24 26  --  24 27 26   GLUCOSE 145*   < > 111* 165*  --  126*  140* 147*  BUN 14   < > 11 9  --  10 14 16   CREATININE 0.85   < > 0.82 0.73  --  0.71 0.89 1.02*  CALCIUM 7.8*   < > 8.2* 7.8*  --  7.9* 7.9* 8.1*  PHOS 2.4*  --   --   --   --   --   --   --    < > = values in this interval not displayed.   GFR: Estimated Creatinine Clearance: 53.7 mL/min (A) (by C-G formula based on SCr of 1.02 mg/dL (H)). Recent Labs  Lab 05/05/18 0545 05/06/18 0827 05/07/18 0506 05/08/18 0521  WBC 14.8* 14.2* 12.0* 14.2*    Liver Function Tests: Recent Labs  Lab 05/02/18 0447 05/03/18 0548 05/04/18 0915 05/05/18 0545 05/08/18 0521  AST 48* 45* 34 25 25  ALT 160* 96* 71* 48* 29  ALKPHOS 105 89 93 83 91  BILITOT 2.3* 2.2* 2.2* 1.5* 1.1  PROT 5.6* 5.8* 5.9* 5.8* 6.6  ALBUMIN 2.8* 2.4* 2.4* 2.0* 2.0*   No results for input(s): LIPASE, AMYLASE in the last 168 hours. No results for input(s): AMMONIA in the last 168 hours.  ABG    Component Value Date/Time   PHART 7.436 05/05/2018 1738   PCO2ART 37.1 05/05/2018 1738   PO2ART 71.0 (L) 05/05/2018 1738   HCO3 25.0 05/05/2018 1738   TCO2 26 05/05/2018 1738   ACIDBASEDEF 10.7 (H) 04/25/2018 2119   O2SAT 95.0 05/05/2018 1738     Coagulation Profile: Recent Labs  Lab 05/02/18 0447 05/03/18 0548  INR 1.7* 1.7*    Cardiac Enzymes: No results for input(s): CKTOTAL, CKMB, CKMBINDEX, TROPONINI in the last 168 hours.  HbA1C: No results found for: HGBA1C  CBG: Recent Labs  Lab 05/07/18 1520 05/07/18 1942 05/07/18 2325 05/08/18 0320 05/08/18 0808  GLUCAP 121* 135* 133* 119* 122*    Critical care time: 30 minutes    Noe Gens, NP-C [address] [facility] Pgr: [phone] or if no answer [phone] 05/08/2018, 9:50 AM

## 2018-05-08 NOTE — Progress Notes (Signed)
Inadvertently cut sutures on abdominal drain #1. Drain not pulled. MD notified, drain secured with tape.

## 2018-05-08 NOTE — Progress Notes (Signed)
13 Days Post-Op   Subjective/Chief Complaint: Continues to do well with weaning trials.  No acute events.    Objective: Vital signs in last 24 hours: Temp:  [99.3 F (37.4 C)-100.4 F (38 C)] 100.4 F (38 C) (04/22 1200) Pulse Rate:  [106-129] 121 (04/22 1600) Resp:  [14-29] 20 (04/22 1600) BP: (120-160)/(62-88) 127/73 (04/22 1600) SpO2:  [98 %-100 %] 99 % (04/22 1600) FiO2 (%):  [30 %] 30 % (04/22 1520) Weight:  [62.6 kg] 62.6 kg (04/22 0500) Last BM Date: 05/07/18  Intake/Output from previous day: 04/21 0701 - 04/22 0700 In: 2432.3 [I.V.:1043.5; NG/GT:945; IV Piggyback:443.8] Out: 2895 [Urine:1450; Emesis/NG output:550; Drains:895] Intake/Output this shift: Total I/O In: 268.1 [I.V.:88.1; NG/GT:180] Out: 3532 [Urine:1600; Drains:220]  PE: Constitutional: No acute distress, intubated/sedated, opens eyes to voice. Not tracking.  Appears states age. Eyes: Anicteric sclerae, moist conjunctiva Lungs: Normal respiratory effort on vent.  synchronous CV: regular rhythm, tachycardic.   GI: Soft, no masses or hepatosplenomegaly, non-tender to palpation, jtube in place, One drain looks cloudy now.  Other drain still clear.   Skin: No rashes, palpation reveals normal turgor   Lab Results:  Recent Labs    05/07/18 0506 05/08/18 0521  WBC 12.0* 14.2*  HGB 7.5* 7.7*  HCT 23.6* 24.8*  PLT 214 263   BMET Recent Labs    05/07/18 0506 05/08/18 0521  NA 145 147*  K 3.6 4.0  CL 108 110  CO2 27 26  GLUCOSE 140* 147*  BUN 14 16  CREATININE 0.89 1.02*  CALCIUM 7.9* 8.1*   PT/INR No results for input(s): LABPROT, INR in the last 72 hours. ABG Recent Labs    05/05/18 1738  PHART 7.436  HCO3 25.0    Studies/Results: Dg Chest Port 1 View  Result Date: 05/07/2018 CLINICAL DATA:  Respiratory failure. EXAM: PORTABLE CHEST 1 VIEW COMPARISON:  Radiograph of May 04, 2018. FINDINGS: The heart size and mediastinal contours are within normal limits. Endotracheal and  nasogastric tubes are unchanged in position. Right internal jugular Port-A-Cath is unchanged in position. No pneumothorax or pleural effusion is noted. Old left rib fractures are noted. There appears to be improvement in bilateral lung opacities compared to prior exam, with mild residual bibasilar opacity concerning for infiltrates or atelectasis. IMPRESSION: Improved bilateral lung opacities are noted compared to prior exam, with mild residual bibasilar opacity concerning for infiltrates or atelectasis. Stable support apparatus. Electronically Signed   By: Marijo Conception M.D.   On: 05/07/2018 08:08    Anti-infectives: Anti-infectives (From admission, onward)   Start     Dose/Rate Route Frequency Ordered Stop   05/04/18 2100  vancomycin (VANCOCIN) IVPB 750 mg/150 ml premix     750 mg 150 mL/hr over 60 Minutes Intravenous Every 12 hours 05/04/18 2027     05/03/18 1000  fluconazole (DIFLUCAN) IVPB 400 mg  Status:  Discontinued     400 mg 100 mL/hr over 120 Minutes Intravenous Every 24 hours 05/02/18 0721 05/02/18 0855   05/02/18 0800  vancomycin (VANCOCIN) 500 mg in sodium chloride 0.9 % 100 mL IVPB  Status:  Discontinued     500 mg 100 mL/hr over 60 Minutes Intravenous Every 12 hours 05/02/18 0721 05/04/18 2027   05/02/18 0800  piperacillin-tazobactam (ZOSYN) IVPB 3.375 g     3.375 g 12.5 mL/hr over 240 Minutes Intravenous Every 8 hours 05/02/18 0721     05/02/18 0730  fluconazole (DIFLUCAN) IVPB 800 mg     800 mg 200 mL/hr over  120 Minutes Intravenous  Once 05/02/18 0721 05/02/18 0952   04/30/18 2230  vancomycin (VANCOCIN) IVPB 750 mg/150 ml premix  Status:  Discontinued     750 mg 150 mL/hr over 60 Minutes Intravenous Every 48 hours 04/28/18 2133 04/29/18 1121   04/30/18 2230  vancomycin (VANCOCIN) IVPB 1000 mg/200 mL premix  Status:  Discontinued     1,000 mg 200 mL/hr over 60 Minutes Intravenous Every 48 hours 04/29/18 1121 04/30/18 0904   04/30/18 1000  vancomycin (VANCOCIN) IVPB 1000  mg/200 mL premix     1,000 mg 200 mL/hr over 60 Minutes Intravenous Every 24 hours 04/30/18 0904 05/01/18 1011   04/30/18 0915  ceFEPIme (MAXIPIME) 2 g in sodium chloride 0.9 % 100 mL IVPB  Status:  Discontinued     2 g 200 mL/hr over 30 Minutes Intravenous Every 12 hours 04/30/18 0905 05/02/18 0721   04/28/18 2130  ceFEPIme (MAXIPIME) 2 g in sodium chloride 0.9 % 100 mL IVPB  Status:  Discontinued     2 g 200 mL/hr over 30 Minutes Intravenous Every 24 hours 04/28/18 2113 04/30/18 0905   04/28/18 2130  vancomycin (VANCOCIN) 1,250 mg in sodium chloride 0.9 % 250 mL IVPB     1,250 mg 166.7 mL/hr over 90 Minutes Intravenous  Once 04/28/18 2129 04/29/18 0107   04/25/18 1530  ceFAZolin (ANCEF) IVPB 2g/100 mL premix     2 g 200 mL/hr over 30 Minutes Intravenous Every 8 hours 04/25/18 1520 04/25/18 1739   04/25/18 0600  ceFAZolin (ANCEF) IVPB 2g/100 mL premix     2 g 200 mL/hr over 30 Minutes Intravenous On call to O.R. 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  1 LN positive and vein margin positive.       Neuro- Morphine gtt weaning.  PRN versed for sedation/analgesia.  Epidural out.  On enteral morphine and decreasing gtt.    Alert periodically, but not always following commands.   Pulm/ID- reintubated for respiratory distress and increased WOB.  Zosyn/vanc should cover respiratory or GI pathogens.   Drain lipase very low.  Pulling one drain today. They are still managing to diurese her a bit.  Fever likely secondary to liver necrosis and pneumonia. Continue coverage for pneumonia and biliary pathogens which include gram negs, anerobes, MRSA and enterococcus.  Plan repeat Ct tomorrow.  Will plan with IV contrast unless Cr higher tomorrow.  CV-  tachycardia stable.  Pt young.  Appears reactive in nature. Staying off pressors.    GI-   LFTs continue to trend down. pull one drain today.  Repeat ct tomorrow.   Previous liver duplex showed patent portal and hepatic veins.  Tube feeds at goal. One drain now appearing cloudy.  Will leave this one and pull other one.  Staples out tomorrow.    Renal- cr now back to baseline.  Has diuresed quite a bit.    FEN: hypokalemia resolved.  Hypernatremia - resolved.    Endocrine- hyperglycemia, continue ssi, glucose under reasonable control. At risk of developing diabetes given amount of pancreas removed.  Will see how blood sugars settle out once acute stress improved.    Heme:  ABL anemia on anemia of chronic dx, anemia of chemotherapy.  stable  VTE ppx- SCDs, lovenox ppx  CCM managing vent/sedation/HCAP  Global:  weaning on vent.   Hepatic necrosis and pneumonia.  Repeat Ct tomorrow, staples out tomorrow.  Continue antibiotics.  cx negative to date.  Continue to follow.     LOS: 13 days    Stark Klein 05/08/2018

## 2018-05-09 ENCOUNTER — Inpatient Hospital Stay (HOSPITAL_COMMUNITY): Payer: Medicaid Other

## 2018-05-09 LAB — GLUCOSE, CAPILLARY
Glucose-Capillary: 117 mg/dL — ABNORMAL HIGH (ref 70–99)
Glucose-Capillary: 128 mg/dL — ABNORMAL HIGH (ref 70–99)
Glucose-Capillary: 106 mg/dL — ABNORMAL HIGH (ref 70–99)
Glucose-Capillary: 88 mg/dL (ref 70–99)
Glucose-Capillary: 111 mg/dL — ABNORMAL HIGH (ref 70–99)
Glucose-Capillary: 130 mg/dL — ABNORMAL HIGH (ref 70–99)

## 2018-05-09 LAB — BASIC METABOLIC PANEL
Anion gap: 9 (ref 5–15)
BUN: 18 mg/dL (ref 6–20)
CO2: 26 mmol/L (ref 22–32)
Calcium: 8.1 mg/dL — ABNORMAL LOW (ref 8.9–10.3)
Chloride: 112 mmol/L — ABNORMAL HIGH (ref 98–111)
Creatinine, Ser: 0.79 mg/dL (ref 0.44–1.00)
GFR calc Af Amer: >60 mL/min (ref >60)
GFR calc non Af Amer: >60 mL/min (ref >60)
Glucose, Bld: 129 mg/dL — ABNORMAL HIGH (ref 70–99)
Potassium: 3.7 mmol/L (ref 3.5–5.1)
Sodium: 147 mmol/L — ABNORMAL HIGH (ref 135–145)

## 2018-05-09 LAB — CBC
HCT: 24.9 % — ABNORMAL LOW (ref 36.0–46.0)
Hemoglobin: 7.7 g/dL — ABNORMAL LOW (ref 12.0–15.0)
MCH: 24.7 pg — ABNORMAL LOW (ref 26.0–34.0)
MCHC: 30.9 g/dL (ref 30.0–36.0)
MCV: 79.8 fL — ABNORMAL LOW (ref 80.0–100.0)
Platelets: 302 10*3/uL (ref 150–400)
RBC: 3.12 MIL/uL — ABNORMAL LOW (ref 3.87–5.11)
WBC: 15.9 10*3/uL — ABNORMAL HIGH (ref 4.0–10.5)
nRBC: 0 % (ref 0.0–0.2)

## 2018-05-09 MED ORDER — BETHANECHOL CHLORIDE 10 MG PO TABS
10.0000 mg | ORAL_TABLET | Freq: Three times a day (TID) | ORAL | Status: DC
  Administered 2018-05-09 – 2018-05-14 (×17): 10 mg
  Filled 2018-05-09 (×17): qty 1

## 2018-05-09 MED ORDER — FREE WATER
200.0000 mL | Freq: Four times a day (QID) | Status: DC
  Administered 2018-05-09 – 2018-05-11 (×8): 200 mL

## 2018-05-09 MED ORDER — IOHEXOL 300 MG/ML  SOLN
100.0000 mL | Freq: Once | INTRAMUSCULAR | Status: AC | PRN
  Administered 2018-05-09: 100 mL via INTRAVENOUS

## 2018-05-09 MED ORDER — BETHANECHOL CHLORIDE 10 MG PO TABS: 10.0000 mg | ORAL_TABLET | Freq: Three times a day (TID) | ORAL | Status: DC

## 2018-05-09 NOTE — Plan of Care (Signed)
  Problem: Clinical Measurements: Goal: Cardiovascular complication will be avoided Outcome: Progressing   Problem: Nutrition: Goal: Adequate nutrition will be maintained Outcome: Progressing   Problem: Elimination: Goal: Will not experience complications related to bowel motility Outcome: Progressing   Problem: Pain Managment: Goal: General experience of comfort will improve Outcome: Progressing   Problem: Bowel/Gastric: Goal: Gastrointestinal status for postoperative course will improve Outcome: Progressing   Problem: Skin Integrity: Goal: Wound healing without signs and symptoms of infection will improve Outcome: Progressing

## 2018-05-09 NOTE — Progress Notes (Signed)
14 Days Post-Op   Subjective/Chief Complaint: Continues to do well on pressure support.  Attempt made to stop morphine gtt yesterday and she got very tachycardic and tachypneic.  Nursing reports that she has not been moving left side over the last few days.  She hadn't really moved it much before, but now it's just not been noted.    Also, drain #2 pulled.  Drain #1 stitch accidentally cut by mistake.  This was taped down.    Objective: Vital signs in last 24 hours: Temp:  [97.9 F (36.6 C)-100.4 F (38 C)] 98.5 F (36.9 C) (04/23 0800) Pulse Rate:  [111-131] 119 (04/23 0900) Resp:  [17-34] 25 (04/23 0900) BP: (122-177)/(63-89) 134/70 (04/23 0900) SpO2:  [98 %-100 %] 100 % (04/23 0900) FiO2 (%):  [30 %] 30 % (04/23 0750) Weight:  [57.6 kg] 57.6 kg (04/23 0500) Last BM Date: 05/07/18  Intake/Output from previous day: 04/22 0701 - 04/23 0700 In: 3141.7 [I.V.:776.7; NG/GT:1815; IV Piggyback:550] Out: 8333 [Urine:2700; Emesis/NG output:300; Drains:540] Intake/Output this shift: Total I/O In: 53 [I.V.:53] Out: -   PE: Constitutional: No acute distress, intubated/sedated, eyes open, but not tracking.  Appears states age. Neuro.  Reacts to replacement of drain stitch, but does not react to pinch on extremities.   Eyes: Anicteric sclerae, moist conjunctiva Lungs: Normal respiratory effort on vent.  synchronous CV: regular rhythm, tachycardic.   GI: Soft, no masses or hepatosplenomegaly, non-tender to palpation, jtube in place, drain #2 pulled.  Drain #1 now looks milky.  This is new.   Skin: No rashes, palpation reveals normal turgor   Lab Results:  Recent Labs    05/08/18 0521 05/09/18 0425  WBC 14.2* 15.9*  HGB 7.7* 7.7*  HCT 24.8* 24.9*  PLT 263 302   BMET Recent Labs    05/08/18 0521 05/09/18 0425  NA 147* 147*  K 4.0 3.7  CL 110 112*  CO2 26 26  GLUCOSE 147* 129*  BUN 16 18  CREATININE 1.02* 0.79  CALCIUM 8.1* 8.1*   PT/INR No results for input(s):  LABPROT, INR in the last 72 hours. ABG No results for input(s): PHART, HCO3 in the last 72 hours.  Invalid input(s): PCO2, PO2  Studies/Results: No results found.  Anti-infectives: Anti-infectives (From admission, onward)   Start     Dose/Rate Route Frequency Ordered Stop   05/04/18 2100  vancomycin (VANCOCIN) IVPB 750 mg/150 ml premix     750 mg 150 mL/hr over 60 Minutes Intravenous Every 12 hours 05/04/18 2027     05/03/18 1000  fluconazole (DIFLUCAN) IVPB 400 mg  Status:  Discontinued     400 mg 100 mL/hr over 120 Minutes Intravenous Every 24 hours 05/02/18 0721 05/02/18 0855   05/02/18 0800  vancomycin (VANCOCIN) 500 mg in sodium chloride 0.9 % 100 mL IVPB  Status:  Discontinued     500 mg 100 mL/hr over 60 Minutes Intravenous Every 12 hours 05/02/18 0721 05/04/18 2027   05/02/18 0800  piperacillin-tazobactam (ZOSYN) IVPB 3.375 g     3.375 g 12.5 mL/hr over 240 Minutes Intravenous Every 8 hours 05/02/18 0721     05/02/18 0730  fluconazole (DIFLUCAN) IVPB 800 mg     800 mg 200 mL/hr over 120 Minutes Intravenous  Once 05/02/18 0721 05/02/18 0952   04/30/18 2230  vancomycin (VANCOCIN) IVPB 750 mg/150 ml premix  Status:  Discontinued     750 mg 150 mL/hr over 60 Minutes Intravenous Every 48 hours 04/28/18 2133 04/29/18 1121  04/30/18 2230  vancomycin (VANCOCIN) IVPB 1000 mg/200 mL premix  Status:  Discontinued     1,000 mg 200 mL/hr over 60 Minutes Intravenous Every 48 hours 04/29/18 1121 04/30/18 0904   04/30/18 1000  vancomycin (VANCOCIN) IVPB 1000 mg/200 mL premix     1,000 mg 200 mL/hr over 60 Minutes Intravenous Every 24 hours 04/30/18 0904 05/01/18 1011   04/30/18 0915  ceFEPIme (MAXIPIME) 2 g in sodium chloride 0.9 % 100 mL IVPB  Status:  Discontinued     2 g 200 mL/hr over 30 Minutes Intravenous Every 12 hours 04/30/18 0905 05/02/18 0721   04/28/18 2130  ceFEPIme (MAXIPIME) 2 g in sodium chloride 0.9 % 100 mL IVPB  Status:  Discontinued     2 g 200 mL/hr over 30  Minutes Intravenous Every 24 hours 04/28/18 2113 04/30/18 0905   04/28/18 2130  vancomycin (VANCOCIN) 1,250 mg in sodium chloride 0.9 % 250 mL IVPB     1,250 mg 166.7 mL/hr over 90 Minutes Intravenous  Once 04/28/18 2129 04/29/18 0107   04/25/18 1530  ceFAZolin (ANCEF) IVPB 2g/100 mL premix     2 g 200 mL/hr over 30 Minutes Intravenous Every 8 hours 04/25/18 1520 04/25/18 1739   04/25/18 0600  ceFAZolin (ANCEF) IVPB 2g/100 mL premix     2 g 200 mL/hr over 30 Minutes Intravenous On call to O.R. 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  1 LN positive and vein margin positive.       Neuro- Morphine gtt wean stalled.  PRN versed for sedation/analgesia.  Epidural out.  On enteral morphine and decreasing gtt.    Not following commands.  Get repeat head CT.  Suspect this is related to narcotics and ICU delirium.    Pulm/ID- reintubated for respiratory distress and increased WOB.  Zosyn/vanc should cover respiratory or GI pathogens.   Drain lipase very low, but drain #2 now milky.  Fever curve decreased; likely secondary to liver necrosis and pneumonia. Continue coverage for pneumonia and biliary pathogens which include gram negs, anerobes, MRSA and enterococcus.  Plan repeat Ct today.  Will plan with IV contrast unless Cr higher tomorrow.  CV- tachycardia stable.  Pt young.  Appears reactive in nature. Staying off pressors.    GI-   LFTs back to normal.  Repeat CT today.  Previous liver duplex showed patent portal and hepatic veins.  Tube feeds at goal. One drain now appearing cloudy.   Staples out today.  Renal- cr now back to baseline.  Has diuresed quite a bit.    FEN: hypokalemia resolved.  Hypernatremia - mild, stable.    Endocrine-  hyperglycemia, continue ssi, glucose under reasonable control. At risk of developing diabetes given amount of pancreas removed.  Will see how blood sugars settle out once acute stress improved.    Heme:  ABL anemia on anemia of chronic dx, anemia of chemotherapy.  stable  VTE ppx- SCDs, lovenox ppx  CCM managing vent/sedation/HCAP   Global:  weaning on vent.   Hepatic necrosis and pneumonia.  Repeat Ct today of abdomen and head, staples out.  Continue antibiotics.  cx negative to date.  Continue to follow.     LOS: 14 days    Stark Klein 05/09/2018

## 2018-05-09 NOTE — Progress Notes (Addendum)
NAME:  Jane Brooks, MRN:  [id number], DOB:  11-06-1970, LOS: 24 ADMISSION DATE:  04/25/2018, CONSULTATION DATE:  04/25/18 REFERRING MD:  Barry Dienes, MD CHIEF COMPLAINT:  Acute hypoxemic respiratory failure  Brief History   48 y/o female with pancreatic adenocarcinoma s/p chemotherapy admitted for planned whipple on 4/9. PCCM consulted for post-op hypotension, AKI and shock liver. Extubated on 4/10 however developed respiratory distress secondary to HCAP and was reintubated on 4/15.  Significant Hospital Events   4/09 Laparotomy 4/10 Respiratory distress 4/15 Re-intubated 4/20 Some PSV 10/5,  Morphine gtt infusing.    Consults:  PCCM  Procedures:  ETT 4/9 >> 4/10 ETT 4/15 >>  Significant Diagnostic Tests:  CT A/P 4/3 >>  Panceratic mass with duct dilation CTA 4/9 >> no PE, s/p pancreas head mass removal, s/p pancreatic stent CT Chest/ABD/Pelvis 4/17 >>  PNA; low attenuation 9cm right lobe of liver MRI ABD 4/17 >> favors biliary necrosis.   Micro Data:  Kendall 4/12 >> negative  BCX 4/16 >>  Tracheal aspirate 4/16 >> few candida UC 4/19 >> negative   Antimicrobials:  Cefazolin 4/9 x1 Cefepime 4/12 >> 4/16 Vanc 4/12 >> Zosyn 4/16 >>   Interim history/subjective:  Morphine gtt, weaned to 3 mg per hour No movement L side per nursing, NO WD to pain Weaning 10/5>> 30% T Max 100.4, WBC up to 15.9 on 4/23 Bethanechol started for Urinary retention to see if we can DC foley  Objective   Blood pressure (!) 148/82, pulse (!) 119, temperature 98.5 F (36.9 C), temperature source Axillary, resp. rate (!) 24, height 4' 11"  (1.499 m), weight 57.6 kg, SpO2 99 %.    Vent Mode: PSV;CPAP FiO2 (%):  [30 %] 30 % Set Rate:  [18 bmp] 18 bmp Vt Set:  [340 mL] 340 mL PEEP:  [5 cmH20] 5 cmH20 Pressure Support:  [5 cmH20-10 cmH20] 10 cmH20 Plateau Pressure:  [16 cmH20-18 cmH20] 16 cmH20   Intake/Output Summary (Last 24 hours) at 05/09/2018 0846 Last data filed at 05/09/2018 0800 Gross per 24  hour  Intake 2926.61 ml  Output 3540 ml  Net -613.39 ml   Filed Weights   05/07/18 0410 05/08/18 0500 05/09/18 0500  Weight: 62.6 kg 62.6 kg 57.6 kg    Physical Exam: General: adult female lying in bed, weaning  HEENT: MM pink/moist, oral ETT is secure and intact, OG tube with bilious drainage Neuro: eyes open, does not follow commands, does not WD to pain CV: s1s2 rrr, no m/r/g PULM: Bilateral chest excursion, even/non-labored, lungs bilaterally clear , diminished per bases TU:UEKC, non-tender, bsx4 active , J tube and R sided drain with milky white drainage  Extremities: warm/dry, trace edema  Skin: no rashes or lesions, warm and dry with surgical incision  Resolved Hospital Problem list   AKI Hypotension  Assessment & Plan:   Acute Hypoxemic Respiratory Failure  HCAP P: PRVC 8cc/kg as rest mode Daily PSV attempt  Follow intermittent CXR  Neuro status is barrier to extubation at this point  Neuro Not following commands  No WD to pain Plan Consider head imaging, last done 4/9   Pancreatic Head Cancer s/p Whipple P: Per CCS Continue enteral morphine  Monitor renal function closely with narcotics TF per nutrition  Consider Culturing perc tube drainage  At Risk AKI Hypokalemia Hypophosphatemia Hypernatremia  P: Add free water 200 ml Q 6 Trend BMP / urinary output Replace electrolytes as indicated Avoid nephrotoxic agents, ensure adequate renal perfusion D51/2NS with 68mq KCL at  52m/hr   Shock Liver -? bililary necrosis on MRI - LFT's normalizing P: Trend LFT's, INR   Will check ammonia 4/24 Trend INR  Best practice:  Diet: TF Pain/Anxiety/Delirium protocol (if indicated): Yes VAP protocol (if indicated): Yes DVT prophylaxis: lovenox GI prophylaxis: PPI Glucose control: SSI Mobility: BR Code Status: Full Family Communication: Per primary  Disposition: ICU  Labs   CBC: Recent Labs  Lab 05/05/18 0545 05/05/18 1738 05/06/18 0827  05/07/18 0506 05/08/18 0521 05/09/18 0425  WBC 14.8*  --  14.2* 12.0* 14.2* 15.9*  HGB 7.9* 8.8* 7.7* 7.5* 7.7* 7.7*  HCT 26.0* 26.0* 24.6* 23.6* 24.8* 24.9*  MCV 80.2  --  80.1 79.2* 79.7* 79.8*  PLT 159  --  221 214 263 3875   Basic Metabolic Panel: Recent Labs  Lab 05/05/18 0545 05/05/18 1738 05/06/18 0827 05/07/18 0506 05/08/18 0521 05/09/18 0425  NA 145 146* 146* 145 147* 147*  K 3.7 3.8 3.9 3.6 4.0 3.7  CL 111  --  110 108 110 112*  CO2 26  --  24 27 26 26   GLUCOSE 165*  --  126* 140* 147* 129*  BUN 9  --  10 14 16 18   CREATININE 0.73  --  0.71 0.89 1.02* 0.79  CALCIUM 7.8*  --  7.9* 7.9* 8.1* 8.1*   GFR: Estimated Creatinine Clearance: 65.8 mL/min (by C-G formula based on SCr of 0.79 mg/dL). Recent Labs  Lab 05/06/18 0827 05/07/18 0506 05/08/18 0521 05/09/18 0425  WBC 14.2* 12.0* 14.2* 15.9*    Liver Function Tests: Recent Labs  Lab 05/03/18 0548 05/04/18 0915 05/05/18 0545 05/08/18 0521  AST 45* 34 25 25  ALT 96* 71* 48* 29  ALKPHOS 89 93 83 91  BILITOT 2.2* 2.2* 1.5* 1.1  PROT 5.8* 5.9* 5.8* 6.6  ALBUMIN 2.4* 2.4* 2.0* 2.0*   No results for input(s): LIPASE, AMYLASE in the last 168 hours. No results for input(s): AMMONIA in the last 168 hours.  ABG    Component Value Date/Time   PHART 7.436 05/05/2018 1738   PCO2ART 37.1 05/05/2018 1738   PO2ART 71.0 (L) 05/05/2018 1738   HCO3 25.0 05/05/2018 1738   TCO2 26 05/05/2018 1738   ACIDBASEDEF 10.7 (H) 04/25/2018 2119   O2SAT 95.0 05/05/2018 1738     Coagulation Profile: Recent Labs  Lab 05/03/18 0548  INR 1.7*    Cardiac Enzymes: No results for input(s): CKTOTAL, CKMB, CKMBINDEX, TROPONINI in the last 168 hours.  HbA1C: No results found for: HGBA1C  CBG: Recent Labs  Lab 05/08/18 1521 05/08/18 1927 05/08/18 2324 05/09/18 0340 05/09/18 0717  GLUCAP 124* 121* 144* 117* 128*    Critical care time: 35 minutes    SMagdalen Spatz AGACNP-BC [facility]  Pgr: [phone] if no answer [phone]/23/2020, 8:46 AM

## 2018-05-10 ENCOUNTER — Inpatient Hospital Stay (HOSPITAL_COMMUNITY): Payer: Medicaid Other

## 2018-05-10 DIAGNOSIS — G9340 Encephalopathy, unspecified: Secondary | ICD-10-CM

## 2018-05-10 LAB — HEPATIC FUNCTION PANEL
ALT: 29 U/L (ref 0–44)
AST: 31 U/L (ref 15–41)
Albumin: 1.9 g/dL — ABNORMAL LOW (ref 3.5–5.0)
Alkaline Phosphatase: 95 U/L (ref 38–126)
Bilirubin, Direct: 0.3 mg/dL — ABNORMAL HIGH (ref 0.0–0.2)
Indirect Bilirubin: 0.8 mg/dL (ref 0.3–0.9)
Total Bilirubin: 1.1 mg/dL (ref 0.3–1.2)
Total Protein: 6.2 g/dL — ABNORMAL LOW (ref 6.5–8.1)

## 2018-05-10 LAB — CBC
HCT: 24.4 % — ABNORMAL LOW (ref 36.0–46.0)
Hemoglobin: 7.6 g/dL — ABNORMAL LOW (ref 12.0–15.0)
MCH: 24.8 pg — ABNORMAL LOW (ref 26.0–34.0)
MCHC: 31.1 g/dL (ref 30.0–36.0)
MCV: 79.5 fL — ABNORMAL LOW (ref 80.0–100.0)
Platelets: 311 10*3/uL (ref 150–400)
RBC: 3.07 MIL/uL — ABNORMAL LOW (ref 3.87–5.11)
RDW: 24.4 % — ABNORMAL HIGH (ref 11.5–15.5)
WBC: 15.4 10*3/uL — ABNORMAL HIGH (ref 4.0–10.5)
nRBC: 0 % (ref 0.0–0.2)

## 2018-05-10 LAB — GLUCOSE, CAPILLARY
Glucose-Capillary: 113 mg/dL — ABNORMAL HIGH (ref 70–99)
Glucose-Capillary: 129 mg/dL — ABNORMAL HIGH (ref 70–99)
Glucose-Capillary: 160 mg/dL — ABNORMAL HIGH (ref 70–99)
Glucose-Capillary: 132 mg/dL — ABNORMAL HIGH (ref 70–99)
Glucose-Capillary: 114 mg/dL — ABNORMAL HIGH (ref 70–99)
Glucose-Capillary: 134 mg/dL — ABNORMAL HIGH (ref 70–99)

## 2018-05-10 LAB — BASIC METABOLIC PANEL
Anion gap: 8 (ref 5–15)
BUN: 15 mg/dL (ref 6–20)
CO2: 24 mmol/L (ref 22–32)
Calcium: 7.8 mg/dL — ABNORMAL LOW (ref 8.9–10.3)
Chloride: 110 mmol/L (ref 98–111)
Creatinine, Ser: 0.6 mg/dL (ref 0.44–1.00)
GFR calc Af Amer: >60 mL/min (ref >60)
GFR calc non Af Amer: >60 mL/min (ref >60)
Glucose, Bld: 123 mg/dL — ABNORMAL HIGH (ref 70–99)
Potassium: 3.7 mmol/L (ref 3.5–5.1)
Sodium: 142 mmol/L (ref 135–145)

## 2018-05-10 LAB — VANCOMYCIN, TROUGH: Vancomycin Tr: 16 ug/mL (ref 15–20)

## 2018-05-10 LAB — VANCOMYCIN, PEAK: Vancomycin Pk: 38 ug/mL (ref 30–40)

## 2018-05-10 LAB — PHOSPHORUS: Phosphorus: 3.1 mg/dL (ref 2.5–4.6)

## 2018-05-10 LAB — PROTIME-INR
INR: 1.3 — ABNORMAL HIGH (ref 0.8–1.2)
Prothrombin Time: 16.2 seconds — ABNORMAL HIGH (ref 11.4–15.2)

## 2018-05-10 LAB — MAGNESIUM: Magnesium: 1.9 mg/dL (ref 1.7–2.4)

## 2018-05-10 LAB — AMMONIA: Ammonia: 92 umol/L — ABNORMAL HIGH (ref 9–35)

## 2018-05-10 MED ORDER — PANTOPRAZOLE SODIUM 40 MG PO PACK
40.0000 mg | PACK | Freq: Every day | ORAL | Status: DC
  Administered 2018-05-10 – 2018-05-14 (×5): 40 mg
  Filled 2018-05-10 (×5): qty 20

## 2018-05-10 MED ORDER — MAGNESIUM SULFATE IN D5W 1-5 GM/100ML-% IV SOLN
1.0000 g | Freq: Once | INTRAVENOUS | Status: AC
  Administered 2018-05-10: 1 g via INTRAVENOUS
  Filled 2018-05-10: qty 100

## 2018-05-10 MED ORDER — LACTULOSE 10 GM/15ML PO SOLN
30.0000 g | Freq: Three times a day (TID) | ORAL | Status: DC
  Administered 2018-05-10 (×3): 30 g
  Filled 2018-05-10 (×3): qty 45

## 2018-05-10 MED ORDER — IBUPROFEN 100 MG/5ML PO SUSP
400.0000 mg | Freq: Three times a day (TID) | ORAL | Status: DC | PRN
  Administered 2018-05-10 – 2018-05-12 (×4): 400 mg via ORAL
  Filled 2018-05-10 (×5): qty 20

## 2018-05-10 NOTE — Progress Notes (Signed)
15 Days Post-Op   Subjective/Chief Complaint: Ammonia level was found to be elevated.  Diarrhea resolved.  Weaning well.  CTs ok.  No fluid collections.    Objective: Vital signs in last 24 hours: Temp:  [98.7 F (37.1 C)-99.9 F (37.7 C)] 99.4 F (37.4 C) (04/24 0400) Pulse Rate:  [101-130] 111 (04/24 0800) Resp:  [19-39] 21 (04/24 0800) BP: (121-156)/(64-82) 131/71 (04/24 0800) SpO2:  [95 %-100 %] 96 % (04/24 0825) FiO2 (%):  [30 %] 30 % (04/24 0825) Weight:  [61.2 kg] 61.2 kg (04/24 0500) Last BM Date: 05/09/18  Intake/Output from previous day: 04/23 0701 - 04/24 0700 In: 2108.1 [I.V.:932.2; NG/GT:495; IV Piggyback:680.9] Out: 2665 [Urine:1715; Emesis/NG output:775; Drains:175] Intake/Output this shift: No intake/output data recorded.  PE: Constitutional: No acute distress, intubated/sedated, answered several simple yes/no questions today.  Improved.     Eyes: Anicteric sclerae, moist conjunctiva Lungs: Normal respiratory effort on vent.  synchronous CV: regular rhythm, tachycardic.   GI: Soft, incision c/d/i.  No drainage.  Staples removed.  Feeding tube in place.  JP still milky. Skin: No rashes, palpation reveals normal turgor   Lab Results:  Recent Labs    05/09/18 0425 05/10/18 0430  WBC 15.9* 15.4*  HGB 7.7* 7.6*  HCT 24.9* 24.4*  PLT 302 311   BMET Recent Labs    05/09/18 0425 05/10/18 0430  NA 147* 142  K 3.7 3.7  CL 112* 110  CO2 26 24  GLUCOSE 129* 123*  BUN 18 15  CREATININE 0.79 0.60  CALCIUM 8.1* 7.8*   PT/INR Recent Labs    05/10/18 0430  LABPROT 16.2*  INR 1.3*   ABG No results for input(s): PHART, HCO3 in the last 72 hours.  Invalid input(s): PCO2, PO2  Studies/Results: Ct Head Wo Contrast  Result Date: 05/09/2018 CLINICAL DATA:  48 year old female with a history of mental status changes EXAM: CT HEAD WITHOUT CONTRAST TECHNIQUE: Contiguous axial images were obtained from the base of the skull through the vertex without  intravenous contrast. COMPARISON:  04/25/2018 FINDINGS: Brain: No acute intracranial hemorrhage. No midline shift or mass effect. Gray-white differentiation maintained. Mild volume loss is unchanged from the comparison. Unremarkable appearance of the ventricular system. Vascular: Unremarkable. Skull: No acute fracture.  No aggressive bone lesion identified. Sinuses/Orbits: Unremarkable appearance of the orbits. Mastoid air cells clear. No middle ear effusion. No significant sinus disease. Other: None IMPRESSION: Negative CT for acute intracranial abnormality. Electronically Signed   By: Corrie Mckusick D.O.   On: 05/09/2018 14:34   Ct Abdomen Pelvis W Contrast  Result Date: 05/09/2018 CLINICAL DATA:  Hepatic necrosis. EXAM: CT ABDOMEN AND PELVIS WITH CONTRAST TECHNIQUE: Multidetector CT imaging of the abdomen and pelvis was performed using the standard protocol following bolus administration of intravenous contrast. CONTRAST:  130m OMNIPAQUE IOHEXOL 300 MG/ML  SOLN COMPARISON:  CT and MRI scan of May 03, 2018. FINDINGS: Lower chest: No acute abnormality. Hepatobiliary: Status post cholecystectomy as part of Whipple's procedure. No biliary dilatation is noted. Irregular low density is seen in right hepatic lobe consistent with hepatic infarction as described on prior MRI. It is more branching in appearance than present on prior exam. Pancreas: Status post Whipple procedure. Visualized portion of pancreatic and body appear intact, with stent involving pancreatic duct. Spleen: Normal in size without focal abnormality. Adrenals/Urinary Tract: Adrenal glands and kidneys appear normal. No hydronephrosis or renal obstruction is noted. Foley catheter is noted in urinary bladder. Stomach/Bowel: Nasogastric tube is seen in distal stomach.  Status post gastrojejunostomy. Wall and fold thickening is seen involving the right and transverse colon which may represent edema or possibly inflammation. There is no definite  evidence of bowel obstruction. Stool is seen in the descending and sigmoid colon and rectum. Jejunostomy is noted and unchanged. Vascular/Lymphatic: Aortic atherosclerosis. No enlarged abdominal or pelvic lymph nodes. Reproductive: Uterus and bilateral adnexa are unremarkable. Other: Small amount of free fluid is noted in the pelvis. No hernia is noted. Only 1 surgical drain remains in the right upper quadrant. Musculoskeletal: No acute or significant osseous findings. IMPRESSION: Status post Whipple's procedure. 1 surgical drain remains in the area. Pancreatic duct stent is noted. 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 It appears to be stable in size compared to prior exam. Wall and fold thickening is seen involving right and transverse colon which may represent edema or possibly inflammation. Aortic Atherosclerosis (ICD10-I70.0). Electronically Signed   By: Marijo Conception M.D.   On: 05/09/2018 14:46   Dg Chest Port 1 View  Result Date: 05/10/2018 CLINICAL DATA:  Hypoxia EXAM: PORTABLE CHEST 1 VIEW COMPARISON:  May 09, 2018 FINDINGS: Endotracheal tube tip is 2.9 cm above the carina. Nasogastric tube tip and side port are below the diaphragm. Port-A-Cath tip is in the superior vena cava near the cavoatrial junction. No pneumothorax. There is mild bibasilar atelectasis. No consolidation. Heart size and pulmonary vascular normal. No adenopathy. There are old healed rib fractures on the left, stable. IMPRESSION: Tube and catheter positions as described without pneumothorax. Mild bibasilar atelectasis. No consolidation. Stable cardiac silhouette. Electronically Signed   By: Lowella Grip III M.D.   On: 05/10/2018 08:04   Dg Chest Port 1 View  Result Date: 05/09/2018 CLINICAL DATA:  Endotracheal tube placement. EXAM: PORTABLE CHEST 1 VIEW COMPARISON:  Radiograph of May 07, 2018. FINDINGS: The heart  size and mediastinal contours are within normal limits. Both lungs are clear. Endotracheal and nasogastric tubes are unchanged in position. Right internal jugular Port-A-Cath is unchanged in position. No pneumothorax or pleural effusion is noted. Old left rib fractures are noted. IMPRESSION: Stable support apparatus. No acute cardiopulmonary abnormality seen. Electronically Signed   By: Marijo Conception M.D.   On: 05/09/2018 13:02    Anti-infectives: Anti-infectives (From admission, onward)   Start     Dose/Rate Route Frequency Ordered Stop   05/04/18 2100  vancomycin (VANCOCIN) IVPB 750 mg/150 ml premix     750 mg 150 mL/hr over 60 Minutes Intravenous Every 12 hours 05/04/18 2027     05/03/18 1000  fluconazole (DIFLUCAN) IVPB 400 mg  Status:  Discontinued     400 mg 100 mL/hr over 120 Minutes Intravenous Every 24 hours 05/02/18 0721 05/02/18 0855   05/02/18 0800  vancomycin (VANCOCIN) 500 mg in sodium chloride 0.9 % 100 mL IVPB  Status:  Discontinued     500 mg 100 mL/hr over 60 Minutes Intravenous Every 12 hours 05/02/18 0721 05/04/18 2027   05/02/18 0800  piperacillin-tazobactam (ZOSYN) IVPB 3.375 g     3.375 g 12.5 mL/hr over 240 Minutes Intravenous Every 8 hours 05/02/18 0721     05/02/18 0730  fluconazole (DIFLUCAN) IVPB 800 mg     800 mg 200 mL/hr over 120 Minutes Intravenous  Once 05/02/18 0721 05/02/18 0952   04/30/18 2230  vancomycin (VANCOCIN) IVPB 750 mg/150 ml premix  Status:  Discontinued  750 mg 150 mL/hr over 60 Minutes Intravenous Every 48 hours 04/28/18 2133 04/29/18 1121   04/30/18 2230  vancomycin (VANCOCIN) IVPB 1000 mg/200 mL premix  Status:  Discontinued     1,000 mg 200 mL/hr over 60 Minutes Intravenous Every 48 hours 04/29/18 1121 04/30/18 0904   04/30/18 1000  vancomycin (VANCOCIN) IVPB 1000 mg/200 mL premix     1,000 mg 200 mL/hr over 60 Minutes Intravenous Every 24 hours 04/30/18 0904 05/01/18 1011   04/30/18 0915  ceFEPIme (MAXIPIME) 2 g in sodium chloride  0.9 % 100 mL IVPB  Status:  Discontinued     2 g 200 mL/hr over 30 Minutes Intravenous Every 12 hours 04/30/18 0905 05/02/18 0721   04/28/18 2130  ceFEPIme (MAXIPIME) 2 g in sodium chloride 0.9 % 100 mL IVPB  Status:  Discontinued     2 g 200 mL/hr over 30 Minutes Intravenous Every 24 hours 04/28/18 2113 04/30/18 0905   04/28/18 2130  vancomycin (VANCOCIN) 1,250 mg in sodium chloride 0.9 % 250 mL IVPB     1,250 mg 166.7 mL/hr over 90 Minutes Intravenous  Once 04/28/18 2129 04/29/18 0107   04/25/18 1530  ceFAZolin (ANCEF) IVPB 2g/100 mL premix     2 g 200 mL/hr over 30 Minutes Intravenous Every 8 hours 04/25/18 1520 04/25/18 1739   04/25/18 0600  ceFAZolin (ANCEF) IVPB 2g/100 mL premix     2 g 200 mL/hr over 30 Minutes Intravenous On call to O.R. 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  1 LN positive and vein margin positive.       Neuro- more responsive today.  Add lactulose for ammonemia.    Pulm/ID- reintubated for respiratory distress and increased WOB.  Zosyn/vanc should cover respiratory or GI pathogens.   Drain lipase very low, but drain #2 now milky.  Fever curve decreased; likely secondary to liver necrosis and pneumonia. Continue coverage for pneumonia and biliary pathogens which include gram negs, anerobes, MRSA and enterococcus.    CV- tachycardia stable.  Pt young.  Appears reactive in nature. Staying off pressors.    GI-   LFTs back to normal.  Still infarction pattern on scan.  No sign of abscess. Previous liver duplex showed patent portal and hepatic veins.  Tube feeds at goal. One drain now appearing cloudy.     Renal- cr now back to baseline.  Has diuresed quite a bit.    FEN: hypokalemia resolved.  Hypernatremia - mild,  stable.    Endocrine- hyperglycemia, continue ssi, glucose under reasonable control. At risk of developing diabetes given amount of pancreas removed.  Will see how blood sugars settle out once acute stress improved.    Heme:  ABL anemia on anemia of chronic dx, anemia of chemotherapy.  stable  VTE ppx- SCDs, lovenox ppx  CCM managing vent/sedation/HCAP   Global:  head CT negative.  Continue to wean narcotics. Lactulose.  More responsive today.  Hopefully we can extubate in the next few days.  Given success on pressure support, would favor trial of extubation prior to trach.     LOS: 15 days    Stark Klein 05/10/2018

## 2018-05-10 NOTE — Progress Notes (Signed)
Pharmacy Antibiotic Note  Jane Brooks is a 48 y.o. female with pancreatic cancer, s/p Whipple on 4/9. On day # 9 Vancomycin and Zosyn for recurrent fevers, pneumonia, sepsis and intra-abdominal coverage.  Fevers thought due to liver necrosis and pneumonia. Has 2 abdominal drains - getting cultures of abdominal fluid today to help guide antimicrobials.   Plan:  Continue Vancomcyin 750 mg IV q12h  Continue Zosyn 3.375 gm IV q8hr  Obtain vancomycin levels tonight  Monitor length of therapy   Height: 4' 11"  (149.9 cm) Weight: 134 lb 14.7 oz (61.2 kg) IBW/kg (Calculated) : 43.2  Temp (24hrs), Avg:99.1 F (37.3 C), Min:98.6 F (37 C), Max:99.4 F (37.4 C)  Recent Labs  Lab 05/04/18 1045 05/04/18 1950  05/06/18 0827 05/07/18 0506 05/08/18 0521 05/09/18 0425 05/10/18 0430  WBC  --   --    < > 14.2* 12.0* 14.2* 15.9* 15.4*  CREATININE  --   --    < > 0.71 0.89 1.02* 0.79 0.60  VANCOTROUGH  --  8*  --   --   --   --   --   --   VANCOPEAK 19*  --   --   --   --   --   --   --    < > = values in this interval not displayed.     Antimicrobials this admission: Cefazolin pre-op 4/9 Cefepime 4/12 >>4/15 Vanc 4/12 >>4/15, resumed 4/16 >> Zosyn 4/16 >>  Fluconazole 4/15 >> 4/16  Dose adjustments this admission:  4/18 peak 19, trough 8 on 500 mg IV q12h, calc AUC 315 > inc 781m q12h for est AUC 472  Microbiology results: 4/16 tracheal aspirate - few Candida albicans 4/16 blood x 2 - negative 4/16 urine - negative 4/12 blood x 2 - negative 4/9 MRSA PCR negative    MHarvel Quale4/24/2020 12:35 PM

## 2018-05-10 NOTE — Progress Notes (Addendum)
NAME:  Jane Brooks, MRN:  [id number], DOB:  Jun 06, 1970, LOS: 90 ADMISSION DATE:  04/25/2018, CONSULTATION DATE:  04/25/18 REFERRING MD:  Barry Dienes, MD CHIEF COMPLAINT:  Acute hypoxemic respiratory failure  Brief History   48 y/o female with pancreatic adenocarcinoma s/p chemotherapy admitted for planned whipple on 4/9. PCCM consulted for post-op hypotension, AKI and shock liver. Extubated on 4/10 however developed respiratory distress secondary to HCAP and was reintubated on 4/15.  Significant Hospital Events   4/09 Laparotomy 4/10 Respiratory distress 4/15 Re-intubated 4/20 Some PSV 10/5,  Morphine gtt infusing.    Consults:  PCCM  Procedures:  ETT 4/9 >> 4/10 ETT 4/15 >>  Significant Diagnostic Tests:  CT A/P 4/3 >>  Panceratic mass with duct dilation CTA 4/9 >> no PE, s/p pancreas head mass removal, s/p pancreatic stent CT Chest/ABD/Pelvis 4/17 >>  PNA; low attenuation 9cm right lobe of liver MRI ABD 4/17 >> favors biliary necrosis.   Micro Data:  [place] 4/12 >> negative  BCX 4/16 >>  Tracheal aspirate 4/16 >> few candida UC 4/19 >> negative   Antimicrobials:  Cefazolin 4/9 x1 Cefepime 4/12 >> 4/16 Vanc 4/12 >> Zosyn 4/16 >>   Interim history/subjective:  Weaning morphine gtt to 2 mg per hour Weaning 10/5>> 30% T Max 99.9, WBC  15.4 on 4/24 Bethanechol started for Urinary retention 4/23>> Will dc foley 4/24  Objective   Blood pressure 131/71, pulse (!) 111, temperature 98.6 F (37 C), temperature source Axillary, resp. rate (!) 21, height 4' 11"  (1.499 m), weight 61.2 kg, SpO2 96 %.    Vent Mode: PSV;CPAP FiO2 (%):  [30 %] 30 % Set Rate:  [18 bmp] 18 bmp Vt Set:  [340 mL] 340 mL PEEP:  [5 cmH20] 5 cmH20 Pressure Support:  [10 cmH20] 10 cmH20 Plateau Pressure:  [14 cmH20-20 cmH20] 14 cmH20   Intake/Output Summary (Last 24 hours) at 05/10/2018 4967 Last data filed at 05/10/2018 0600 Gross per 24 hour  Intake 1841.04 ml  Output 2665 ml  Net -823.96 ml   Filed  Weights   05/08/18 0500 05/09/18 0500 05/10/18 0500  Weight: 62.6 kg 57.6 kg 61.2 kg    Physical Exam: General: adult female lying in bed, weaning  HEENT: MM pink/moist, oral ETT is secure and intact, OG tube with bilious drainage Neuro: eyes open, nods to questions, attempts to lip speak, Global weakness noted CV: s1s2 rrr, no m/r/g PULM: Bilateral chest excursion, even/non-labored, lungs bilaterally clear , diminished per bases RF:FMBW, non-tender, bsx4 active ,  R sided drain with milky white drainage  Extremities: warm/dry, trace edema  Skin: no rashes or lesions, warm and dry with surgical incision  Resolved Hospital Problem list   AKI Hypotension  Assessment & Plan:   Acute Hypoxemic Respiratory Failure  HCAP>> resolving Weaning 10/5 P: PRVC 8cc/kg as rest mode Daily PSV attempt  Follow intermittent CXR  Ammonia Level of 92>> contributing to decreased LOC Initiate Lactulose   Neuro Opens eyes, nods head Ammonia Level 92 CT Head without acute abnormality Plan Lactulose 30 TID x 2 days Trend ammonia levels   Pancreatic Head Cancer s/p Whipple P: Per CCS Wean  enteral morphine to 2 mg per hour  Monitor renal function closely with narcotics TF per nutrition  Will culture drainage from JP  At Risk AKI Hypokalemia Hypophosphatemia Hypernatremia  P: Add free water 200 ml Q 6 Trend BMP / urinary output Replace electrolytes as indicated Avoid nephrotoxic agents, ensure adequate renal perfusion D51/2NS with 64mq  KCL at 5m/hr   Shock Liver -? bililary necrosis on MRI - LFT's normalizing - Ammonia 92 P: Trend LFT's, INR  Lactulose  Will re-check ammonia 4/25 Trend INR  Best practice:  Diet: TF Pain/Anxiety/Delirium protocol (if indicated): Yes VAP protocol (if indicated): Yes DVT prophylaxis: lovenox GI prophylaxis: PPI Glucose control: SSI Mobility: BR Code Status: Full Family Communication: Per primary  Disposition: ICU  Hopeful with  lactulose treatment ammonia will normalize, she will be more alert and we will be able to extubate.  Labs   CBC: Recent Labs  Lab 05/06/18 0827 05/07/18 0506 05/08/18 0521 05/09/18 0425 05/10/18 0430  WBC 14.2* 12.0* 14.2* 15.9* 15.4*  HGB 7.7* 7.5* 7.7* 7.7* 7.6*  HCT 24.6* 23.6* 24.8* 24.9* 24.4*  MCV 80.1 79.2* 79.7* 79.8* 79.5*  PLT 221 214 263 302 3010   Basic Metabolic Panel: Recent Labs  Lab 05/06/18 0827 05/07/18 0506 05/08/18 0521 05/09/18 0425 05/10/18 0430  NA 146* 145 147* 147* 142  K 3.9 3.6 4.0 3.7 3.7  CL 110 108 110 112* 110  CO2 24 27 26 26 24   GLUCOSE 126* 140* 147* 129* 123*  BUN 10 14 16 18 15   CREATININE 0.71 0.89 1.02* 0.79 0.60  CALCIUM 7.9* 7.9* 8.1* 8.1* 7.8*  MG  --   --   --   --  1.9  PHOS  --   --   --   --  3.1   GFR: Estimated Creatinine Clearance: 67.7 mL/min (by C-G formula based on SCr of 0.6 mg/dL). Recent Labs  Lab 05/07/18 0506 05/08/18 0521 05/09/18 0425 05/10/18 0430  WBC 12.0* 14.2* 15.9* 15.4*    Liver Function Tests: Recent Labs  Lab 05/04/18 0915 05/05/18 0545 05/08/18 0521 05/10/18 0430  AST 34 25 25 31   ALT 71* 48* 29 29  ALKPHOS 93 83 91 95  BILITOT 2.2* 1.5* 1.1 1.1  PROT 5.9* 5.8* 6.6 6.2*  ALBUMIN 2.4* 2.0* 2.0* 1.9*   No results for input(s): LIPASE, AMYLASE in the last 168 hours. Recent Labs  Lab 05/10/18 0430  AMMONIA 92*    ABG    Component Value Date/Time   PHART 7.436 05/05/2018 1738   PCO2ART 37.1 05/05/2018 1738   PO2ART 71.0 (L) 05/05/2018 1738   HCO3 25.0 05/05/2018 1738   TCO2 26 05/05/2018 1738   ACIDBASEDEF 10.7 (H) 04/25/2018 2119   O2SAT 95.0 05/05/2018 1738     Coagulation Profile: Recent Labs  Lab 05/10/18 0430  INR 1.3*    Cardiac Enzymes: No results for input(s): CKTOTAL, CKMB, CKMBINDEX, TROPONINI in the last 168 hours.  HbA1C: No results found for: HGBA1C  CBG: Recent Labs  Lab 05/09/18 1608 05/09/18 1922 05/09/18 2311 05/10/18 0313 05/10/18 0716   GLUCAP 88 111* 130* 113* 129*    Critical care time: 31 minutes    SMagdalen Spatz AGACNP-BC [facility] Pgr: [phone] if no answer [phone]/24/2020, 9:26 AM  Attending Note:  48year old female with pancreatic cancer who presents post whipple with respiratory failure and sepsis.  On exam, she is unresponsive with clear lungs.  I reviewed CXR myself, ETT is in a good position.  Discussed with PCCM-NP.  Will culture the JP drainage.  Continue abx.  Continue PS trials but no extubation due to mental status.  Ammonia was 92, added lactulose.  Once more awake will extubate.  The patient is critically ill with multiple organ systems failure and requires high complexity decision  making for assessment and support, frequent evaluation and titration of therapies, application of advanced monitoring technologies and extensive interpretation of multiple databases.   Critical Care Time devoted to patient care services described in this note is  32  Minutes. This time reflects time of care of this signee Dr Jennet Maduro. This critical care time does not reflect procedure time, or teaching time or supervisory time of PA/NP/Med student/Med Resident etc but could involve care discussion time.  Rush Farmer, M.D. [facility]. Pager: [phone]. After hours pager: [phone].

## 2018-05-11 ENCOUNTER — Inpatient Hospital Stay (HOSPITAL_COMMUNITY): Payer: Medicaid Other

## 2018-05-11 DIAGNOSIS — Z978 Presence of other specified devices: Secondary | ICD-10-CM

## 2018-05-11 LAB — BASIC METABOLIC PANEL
Anion gap: 10 (ref 5–15)
BUN: 12 mg/dL (ref 6–20)
CO2: 24 mmol/L (ref 22–32)
Calcium: 8.1 mg/dL — ABNORMAL LOW (ref 8.9–10.3)
Chloride: 108 mmol/L (ref 98–111)
Creatinine, Ser: 0.62 mg/dL (ref 0.44–1.00)
GFR calc Af Amer: >60 mL/min (ref >60)
GFR calc non Af Amer: >60 mL/min (ref >60)
Glucose, Bld: 130 mg/dL — ABNORMAL HIGH (ref 70–99)
Potassium: 3.2 mmol/L — ABNORMAL LOW (ref 3.5–5.1)
Sodium: 142 mmol/L (ref 135–145)

## 2018-05-11 LAB — GLUCOSE, CAPILLARY
Glucose-Capillary: 109 mg/dL — ABNORMAL HIGH (ref 70–99)
Glucose-Capillary: 129 mg/dL — ABNORMAL HIGH (ref 70–99)
Glucose-Capillary: 125 mg/dL — ABNORMAL HIGH (ref 70–99)
Glucose-Capillary: 114 mg/dL — ABNORMAL HIGH (ref 70–99)
Glucose-Capillary: 123 mg/dL — ABNORMAL HIGH (ref 70–99)
Glucose-Capillary: 127 mg/dL — ABNORMAL HIGH (ref 70–99)

## 2018-05-11 LAB — CBC
HCT: 23.4 % — ABNORMAL LOW (ref 36.0–46.0)
Hemoglobin: 7.2 g/dL — ABNORMAL LOW (ref 12.0–15.0)
MCH: 24.5 pg — ABNORMAL LOW (ref 26.0–34.0)
MCHC: 30.8 g/dL (ref 30.0–36.0)
MCV: 79.6 fL — ABNORMAL LOW (ref 80.0–100.0)
Platelets: ADEQUATE 10*3/uL (ref 150–400)
RBC: 2.94 MIL/uL — ABNORMAL LOW (ref 3.87–5.11)
WBC: 11.2 10*3/uL — ABNORMAL HIGH (ref 4.0–10.5)
nRBC: 0 % (ref 0.0–0.2)

## 2018-05-11 LAB — AMMONIA: Ammonia: 32 umol/L (ref 9–35)

## 2018-05-11 LAB — PROTIME-INR
INR: 1.4 — ABNORMAL HIGH (ref 0.8–1.2)
Prothrombin Time: 16.6 seconds — ABNORMAL HIGH (ref 11.4–15.2)

## 2018-05-11 LAB — CALCIUM, IONIZED: Calcium, Ionized, Serum: 4.9 mg/dL (ref 4.5–5.6)

## 2018-05-11 MED ORDER — HALOPERIDOL LACTATE 5 MG/ML IJ SOLN
2.5000 mg | Freq: Once | INTRAMUSCULAR | Status: AC
  Administered 2018-05-11: 2.5 mg via INTRAVENOUS
  Filled 2018-05-11: qty 1

## 2018-05-11 MED ORDER — ALBUTEROL SULFATE (2.5 MG/3ML) 0.083% IN NEBU: 2.5000 mg | INHALATION_SOLUTION | RESPIRATORY_TRACT | Status: DC | PRN

## 2018-05-11 MED ORDER — IPRATROPIUM-ALBUTEROL 0.5-2.5 (3) MG/3ML IN SOLN
3.0000 mL | Freq: Four times a day (QID) | RESPIRATORY_TRACT | Status: DC
  Administered 2018-05-11: 3 mL via RESPIRATORY_TRACT
  Filled 2018-05-11 (×2): qty 3

## 2018-05-11 MED ORDER — POTASSIUM CHLORIDE 20 MEQ/15ML (10%) PO SOLN
20.0000 meq | Freq: Two times a day (BID) | ORAL | Status: DC
  Administered 2018-05-11 – 2018-05-14 (×7): 20 meq
  Filled 2018-05-11 (×7): qty 15

## 2018-05-11 MED ORDER — CLOTRIMAZOLE 2 % VA CREA
1.0000 | TOPICAL_CREAM | Freq: Every day | VAGINAL | Status: AC
  Administered 2018-05-11 – 2018-05-13 (×3): 1 via VAGINAL
  Filled 2018-05-11: qty 21

## 2018-05-11 MED ORDER — ORAL CARE MOUTH RINSE
15.0000 mL | Freq: Two times a day (BID) | OROMUCOSAL | Status: DC
  Administered 2018-05-11 – 2018-05-15 (×5): 15 mL via OROMUCOSAL

## 2018-05-11 MED ORDER — LACTULOSE 10 GM/15ML PO SOLN
20.0000 g | Freq: Two times a day (BID) | ORAL | Status: DC
  Administered 2018-05-11 – 2018-05-14 (×7): 20 g
  Filled 2018-05-11 (×7): qty 30

## 2018-05-11 MED ORDER — FREE WATER
200.0000 mL | Freq: Three times a day (TID) | Status: DC
  Administered 2018-05-11 – 2018-05-15 (×11): 200 mL

## 2018-05-11 NOTE — Procedures (Signed)
Extubation Procedure Note  Patient Details:   Name: Jane Brooks DOB: April 02, 1970 MRN: [id number]   Airway Documentation:    Vent end date: 05/11/18 Vent end time: 1140   Evaluation  O2 sats: stable throughout Complications: No apparent complications Patient did tolerate procedure well. Bilateral Breath Sounds: Rhonchi   Yes   Patient extubated to 4L [place] without complications. Positive cuff leak. No stridor noted. RN at bedside. RT will continue to monitor.  Herbie Baltimore 05/11/2018, 11:44 AM

## 2018-05-11 NOTE — Progress Notes (Signed)
Pt on camera being watched at nursing station. Bed alarm on as patient was confused post extubation. Pt began climbing out of bed. Bed alarm did go off. 2 RNs and NT raced to room. Pt on floor sitting next to bed. Stated she was going to call her sister and slipped. Denies hitting head and denies pain. Is apologetic. Staff assisted back to bed. Floor mats in place. Dr. Barry Dienes and pt's sister, Lenna Sciara, notified. Posey placed. Haldol ordered. Arranged call with sister via [place]. Will cont to monitor closely.

## 2018-05-11 NOTE — Progress Notes (Signed)
Pt hasnt been able to void since I/O cath at 0400 this am. Pt bladder scanned for 449cc and I/O cathed for 400 cc amber urine. Pt also noted to have large amount of white discharge. Md notified. Ellamae Sia

## 2018-05-11 NOTE — Progress Notes (Signed)
Assisted tele visit to patient with sister  Hillard Danker, RN

## 2018-05-11 NOTE — Progress Notes (Signed)
Since patients fall today and subsequent use of waist belt as restraint, patient has pulled out her peripheral iv, pulled out her implanted port iv tubing. Patient has been anxious to go home,frequently trying to get oob ,unless supervised. Staff was able to supervise intermittently ,with frequent verbal contacts, frequent reorienting, pt able to speak with sister, used distraction with staff talking to her, watching tv. Patient still pulling leads and gown off and worried she may pull out peg tube. Bilateral wrist restraints applied. Jane Brooks

## 2018-05-11 NOTE — Progress Notes (Signed)
Pharmacy Antibiotic Note  Jane Brooks is a 48 y.o. female with pancreatic cancer, s/p Whipple on 4/9. On day # 9 Vancomycin and Zosyn for recurrent fevers, pneumonia, sepsis and intra-abdominal coverage.   Fevers thought due to liver necrosis and pneumonia. Has 2 abdominal drains - getting cultures of abdominal fluid today to help guide antimicrobials.  4/25 AM update: Vancomycin peak 38, vancomycin trough 16. Calculated AUC is therapeutic at 535   Plan:  Continue Vancomcyin 750 mg IV q12h  Continue Zosyn 3.375 gm IV q8hr  Monitor length of therapy  Re-check levels as needed   Height: 4' 11"  (149.9 cm) Weight: 134 lb 14.7 oz (61.2 kg) IBW/kg (Calculated) : 43.2  Temp (24hrs), Avg:99.3 F (37.4 C), Min:97.9 F (36.6 C), Max:101.1 F (38.4 C)  Recent Labs  Lab 05/04/18 1045 05/04/18 1950  05/06/18 0827 05/07/18 0506 05/08/18 0521 05/09/18 0425 05/10/18 0430 05/10/18 1730 05/10/18 2251  WBC  --   --    < > 14.2* 12.0* 14.2* 15.9* 15.4*  --   --   CREATININE  --   --    < > 0.71 0.89 1.02* 0.79 0.60  --   --   VANCOTROUGH  --  8*  --   --   --   --   --   --  16  --   VANCOPEAK 19*  --   --   --   --   --   --   --   --  38   < > = values in this interval not displayed.     Antimicrobials this admission: Cefazolin pre-op 4/9 Cefepime 4/12 >>4/15 Vanc 4/12 >>4/15, resumed 4/16 >> Zosyn 4/16 >>  Fluconazole 4/15 >> 4/16  Dose adjustments this admission:  4/18: peak 19, trough 8 on 500 mg IV q12h, calc AUC 315 > inc 761m q12h for est AUC 472 4/24: peak 38, trough 16, cont 750 mg IV q12h, AUC is 535  Microbiology results: 4/16 tracheal aspirate - few Candida albicans 4/16 blood x 2 - negative 4/16 urine - negative 4/12 blood x 2 - negative 4/9 MRSA PCR negative    LNarda Bonds4/25/2020 12:54 AM

## 2018-05-11 NOTE — Progress Notes (Addendum)
NAME:  Jane Brooks, MRN:  [id number], DOB:  [birth year], LOS: 60 ADMISSION DATE:  04/25/2018, CONSULTATION DATE:  04/25/18 REFERRING MD:  Barry Dienes, MD CHIEF COMPLAINT:  Acute hypoxemic respiratory failure  Brief History   48 y/o female with pancreatic adenocarcinoma s/p chemotherapy admitted for planned whipple on 4/9. PCCM consulted for post-op hypotension, AKI and shock liver. Extubated on 4/10 however developed respiratory distress secondary to HCAP and was reintubated on 4/15.  Significant Hospital Events   4/09 Laparotomy 4/10 Respiratory distress 4/15 Re-intubated 4/20 Some PSV 10/5,  Morphine gtt infusing.    Consults:  PCCM  Procedures:  ETT 4/9 >> 4/10 ETT 4/15 >>  Significant Diagnostic Tests:  CT A/P 4/3 >>  Panceratic mass with duct dilation CTA 4/9 >> no PE, s/p pancreas head mass removal, s/p pancreatic stent CT Chest/ABD/Pelvis 4/17 >>  PNA; low attenuation 9cm right lobe of liver MRI ABD 4/17 >> favors biliary necrosis.   Micro Data:  [place] 4/12 >> negative  BCX 4/16 >>  Tracheal aspirate 4/16 >> few candida UC 4/19 >> negative  PEG drainage 4/24>>   Antimicrobials:  Cefazolin 4/9 x1 Cefepime 4/12 >> 4/16 Vanc 4/12 >> Zosyn 4/16 >>   Interim history/subjective:  Morphine up titrated to 4 mg over night ( plan had been to wean to 2 ) Weaning 10/5>> 30%, good volumes, Rate 17 T Max 99.9, WBC  15.4 on 4/24 Bethanechol started for Urinary retention 4/23>> Will dc foley 4/24  Objective   Blood pressure 135/76, pulse (!) 117, temperature 98.4 F (36.9 C), temperature source Axillary, resp. rate 19, height 4' 11"  (1.499 m), weight 59.9 kg, SpO2 100 %.    Vent Mode: PSV;CPAP FiO2 (%):  [30 %] 30 % Set Rate:  [18 bmp] 18 bmp Vt Set:  [340 mL] 340 mL PEEP:  [5 cmH20] 5 cmH20 Pressure Support:  [10 cmH20] 10 cmH20 Plateau Pressure:  [26 cmH20] 26 cmH20   Intake/Output Summary (Last 24 hours) at 05/11/2018 3474 Last data filed at 05/11/2018 0700 Gross per 24  hour  Intake 3854.2 ml  Output 2560 ml  Net 1294.2 ml   Filed Weights   05/09/18 0500 05/10/18 0500 05/11/18 0500  Weight: 57.6 kg 61.2 kg 59.9 kg    Physical Exam: General: adult female lying in bed, weaning ,awake and alert HEENT: MM pink/moist, oral ETT is secure and intact, OG tube with bilious drainage Neuro: awake and alert, nods to questions, attempts to lip speak, Global weakness improved, following commands, MAE x 4 CV: s1s2 rrr, no m/r/g, ST per tele PULM: Bilateral chest excursion, even/non-labored, lungs bilateral rhonchi , diminished per bases QV:ZDGL, non-tender, bsx4 active ,  R sided JP drain with milky white drainage  Extremities: warm/dry, trace edema  Skin: no rashes or lesions, warm and dry with surgical incision, tattoos  Resolved Hospital Problem list   AKI Hypotension  Assessment & Plan:   Acute Hypoxemic Respiratory Failure  HCAP>> resolving Weaning 10/5 Current every day smoker 37 pack years No maintenance inhaler therapy No PFT's P: PRVC 8cc/kg as rest mode Daily PSV attempt  Follow intermittent CXR  Ammonia Level of 92>> 32 with Lactulose treatment Will change prn DuoNeb to scheduled    Neuro Opens eyes, nods head, following commands Ammonia Level 92>> 32 after lactulose CT Head without acute abnormality Plan Decrease Lactulose to 20 BID  Trend ammonia levels Neuro checks per unit routine   Pancreatic Head Cancer s/p Whipple P: Per CCS Wean  enteral morphine  to 2 mg per hour  Monitor renal function closely with narcotics TF per nutrition  Trend Micro from JP culture 4/24  Fever/ Leukocytosis Plan Trend fever curve/ WBC CBC with diff 4/26 ABX per surgery Follow micro, narrow ABX as able Re-culture as is clinically indicated  At Risk AKI Hypokalemia Hypophosphatemia Hypernatremia Calcium corrects to 9.8 on 4/25  P: Decrease free water 200 ml Q 8 Trend BMP / urinary output Replace electrolytes as indicated Avoid  nephrotoxic agents, ensure adequate renal perfusion D51/2NS with 77mq KCL at 424mhr   Shock Liver -? bililary necrosis on MRI - LFT's normalizing - Ammonia 92 P: Trend LFT's, INR  Lactulose  Trend Ammonia   Best practice:  Diet: TF Pain/Anxiety/Delirium protocol (if indicated): Yes VAP protocol (if indicated): Yes DVT prophylaxis: lovenox GI prophylaxis: PPI Glucose control: SSI Mobility: BR Code Status: Full Family Communication: Per primary  Disposition: ICU  Significantly more alert, ammonia level 92>> 32 after lactulose Weaning on 10/5 >>Consider extubation>> She has a significant smoking history  Labs   CBC: Recent Labs  Lab 05/06/18 0827 05/07/18 0506 05/08/18 0521 05/09/18 0425 05/10/18 0430  WBC 14.2* 12.0* 14.2* 15.9* 15.4*  HGB 7.7* 7.5* 7.7* 7.7* 7.6*  HCT 24.6* 23.6* 24.8* 24.9* 24.4*  MCV 80.1 79.2* 79.7* 79.8* 79.5*  PLT 221 214 263 302 31518  Basic Metabolic Panel: Recent Labs  Lab 05/07/18 0506 05/08/18 0521 05/09/18 0425 05/10/18 0430 05/11/18 0542  NA 145 147* 147* 142 142  K 3.6 4.0 3.7 3.7 3.2*  CL 108 110 112* 110 108  CO2 27 26 26 24 24   GLUCOSE 140* 147* 129* 123* 130*  BUN 14 16 18 15 12   CREATININE 0.89 1.02* 0.79 0.60 0.62  CALCIUM 7.9* 8.1* 8.1* 7.8* 8.1*  MG  --   --   --  1.9  --   PHOS  --   --   --  3.1  --    GFR: Estimated Creatinine Clearance: 67 mL/min (by C-G formula based on SCr of 0.62 mg/dL). Recent Labs  Lab 05/07/18 0506 05/08/18 0521 05/09/18 0425 05/10/18 0430  WBC 12.0* 14.2* 15.9* 15.4*    Liver Function Tests: Recent Labs  Lab 05/04/18 0915 05/05/18 0545 05/08/18 0521 05/10/18 0430  AST 34 25 25 31   ALT 71* 48* 29 29  ALKPHOS 93 83 91 95  BILITOT 2.2* 1.5* 1.1 1.1  PROT 5.9* 5.8* 6.6 6.2*  ALBUMIN 2.4* 2.0* 2.0* 1.9*   No results for input(s): LIPASE, AMYLASE in the last 168 hours. Recent Labs  Lab 05/10/18 0430 05/11/18 0542  AMMONIA 92* 32    ABG    Component Value  Date/Time   PHART 7.436 05/05/2018 1738   PCO2ART 37.1 05/05/2018 1738   PO2ART 71.0 (L) 05/05/2018 1738   HCO3 25.0 05/05/2018 1738   TCO2 26 05/05/2018 1738   ACIDBASEDEF 10.7 (H) 04/25/2018 2119   O2SAT 95.0 05/05/2018 1738     Coagulation Profile: Recent Labs  Lab 05/10/18 0430 05/11/18 0542  INR 1.3* 1.4*    Cardiac Enzymes: No results for input(s): CKTOTAL, CKMB, CKMBINDEX, TROPONINI in the last 168 hours.  HbA1C: No results found for: HGBA1C  CBG: Recent Labs  Lab 05/10/18 1641 05/10/18 1953 05/10/18 2315 05/11/18 0319 05/11/18 0751  GLUCAP 132* 114* 134* 109* 129*    Critical care time: 31 minutes    SaMagdalen SpatzAGACNP-BC [facility] Pgr: [phone] if no answer [phone]/25/2020, 8:22  AM

## 2018-05-11 NOTE — Progress Notes (Signed)
16 Days Post-Op   Subjective/Chief Complaint: Lactulose improved ammonia and mental status is improving.  No acute events. Continues to do well on pressure support.   Objective: Vital signs in last 24 hours: Temp:  [97.9 F (36.6 C)-101.1 F (38.4 C)] 98.4 F (36.9 C) (04/25 0400) Pulse Rate:  [101-135] 117 (04/25 0724) Resp:  [17-46] 19 (04/25 0724) BP: (108-169)/(62-85) 135/76 (04/25 0724) SpO2:  [93 %-100 %] 100 % (04/25 0724) FiO2 (%):  [30 %] 30 % (04/25 0724) Weight:  [59.9 kg] 59.9 kg (04/25 0500) Last BM Date: 05/10/18  Intake/Output from previous day: 04/24 0701 - 04/25 0700 In: 3854.2 [I.V.:1089.2; NG/GT:2015; IV Piggyback:750.1] Out: 2560 [Urine:1150; Emesis/NG output:300; Drains:110; Stool:1000] Intake/Output this shift: No intake/output data recorded.  PE: Constitutional: No acute distress, intubated/sedated, more interactive Eyes: Anicteric sclerae, moist conjunctiva Lungs: Normal respiratory effort on vent.  synchronous CV: regular rhythm, tachycardic.   GI: Soft, incision c/d/i.  No drainage.  Staples removed.  Feeding tube in place.  JP still milky. Skin: No rashes, palpation reveals normal turgor   Lab Results:  Recent Labs    05/09/18 0425 05/10/18 0430  WBC 15.9* 15.4*  HGB 7.7* 7.6*  HCT 24.9* 24.4*  PLT 302 311   BMET Recent Labs    05/10/18 0430 05/11/18 0542  NA 142 142  K 3.7 3.2*  CL 110 108  CO2 24 24  GLUCOSE 123* 130*  BUN 15 12  CREATININE 0.60 0.62  CALCIUM 7.8* 8.1*   PT/INR Recent Labs    05/10/18 0430 05/11/18 0542  LABPROT 16.2* 16.6*  INR 1.3* 1.4*   ABG No results for input(s): PHART, HCO3 in the last 72 hours.  Invalid input(s): PCO2, PO2  Studies/Results: Ct Head Wo Contrast  Result Date: 05/09/2018 CLINICAL DATA:  48 year old female with a history of mental status changes EXAM: CT HEAD WITHOUT CONTRAST TECHNIQUE: Contiguous axial images were obtained from the base of the skull through the vertex without  intravenous contrast. COMPARISON:  04/25/2018 FINDINGS: Brain: No acute intracranial hemorrhage. No midline shift or mass effect. Gray-white differentiation maintained. Mild volume loss is unchanged from the comparison. Unremarkable appearance of the ventricular system. Vascular: Unremarkable. Skull: No acute fracture.  No aggressive bone lesion identified. Sinuses/Orbits: Unremarkable appearance of the orbits. Mastoid air cells clear. No middle ear effusion. No significant sinus disease. Other: None IMPRESSION: Negative CT for acute intracranial abnormality. Electronically Signed   By: Corrie Mckusick D.O.   On: 05/09/2018 14:34   Ct Abdomen Pelvis W Contrast  Result Date: 05/09/2018 CLINICAL DATA:  Hepatic necrosis. EXAM: CT ABDOMEN AND PELVIS WITH CONTRAST TECHNIQUE: Multidetector CT imaging of the abdomen and pelvis was performed using the standard protocol following bolus administration of intravenous contrast. CONTRAST:  121m OMNIPAQUE IOHEXOL 300 MG/ML  SOLN COMPARISON:  CT and MRI scan of May 03, 2018. FINDINGS: Lower chest: No acute abnormality. Hepatobiliary: Status post cholecystectomy as part of Whipple's procedure. No biliary dilatation is noted. Irregular low density is seen in right hepatic lobe consistent with hepatic infarction as described on prior MRI. It is more branching in appearance than present on prior exam. Pancreas: Status post Whipple procedure. Visualized portion of pancreatic and body appear intact, with stent involving pancreatic duct. Spleen: Normal in size without focal abnormality. Adrenals/Urinary Tract: Adrenal glands and kidneys appear normal. No hydronephrosis or renal obstruction is noted. Foley catheter is noted in urinary bladder. Stomach/Bowel: Nasogastric tube is seen in distal stomach. Status post gastrojejunostomy. Wall and fold thickening  is seen involving the right and transverse colon which may represent edema or possibly inflammation. There is no definite  evidence of bowel obstruction. Stool is seen in the descending and sigmoid colon and rectum. Jejunostomy is noted and unchanged. Vascular/Lymphatic: Aortic atherosclerosis. No enlarged abdominal or pelvic lymph nodes. Reproductive: Uterus and bilateral adnexa are unremarkable. Other: Small amount of free fluid is noted in the pelvis. No hernia is noted. Only 1 surgical drain remains in the right upper quadrant. Musculoskeletal: No acute or significant osseous findings. IMPRESSION: Status post Whipple's procedure. 1 surgical drain remains in the area. Pancreatic duct stent is noted. 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 It appears to be stable in size compared to prior exam. Wall and fold thickening is seen involving right and transverse colon which may represent edema or possibly inflammation. Aortic Atherosclerosis (ICD10-I70.0). Electronically Signed   By: Marijo Conception M.D.   On: 05/09/2018 14:46   Dg Chest Port 1 View  Result Date: 05/11/2018 CLINICAL DATA:  Respiratory distress.  Pancreatic adenocarcinoma. EXAM: PORTABLE CHEST 1 VIEW COMPARISON:  05/10/2018. FINDINGS: Endotracheal tube and nasogastric tube in good position, unchanged. Port-A-Cath unchanged. Normal heart size. BILATERAL diffuse lung opacities are stable to improved. Mild subsegmental atelectasis at the LEFT base redemonstrated. Healed rib fractures. IMPRESSION: Improved aeration. Electronically Signed   By: Staci Righter M.D.   On: 05/11/2018 07:20   Dg Chest Port 1 View  Result Date: 05/10/2018 CLINICAL DATA:  Hypoxia EXAM: PORTABLE CHEST 1 VIEW COMPARISON:  May 09, 2018 FINDINGS: Endotracheal tube tip is 2.9 cm above the carina. Nasogastric tube tip and side port are below the diaphragm. Port-A-Cath tip is in the superior vena cava near the cavoatrial junction. No pneumothorax. There is mild bibasilar atelectasis. No  consolidation. Heart size and pulmonary vascular normal. No adenopathy. There are old healed rib fractures on the left, stable. IMPRESSION: Tube and catheter positions as described without pneumothorax. Mild bibasilar atelectasis. No consolidation. Stable cardiac silhouette. Electronically Signed   By: Lowella Grip III M.D.   On: 05/10/2018 08:04   Dg Chest Port 1 View  Result Date: 05/09/2018 CLINICAL DATA:  Endotracheal tube placement. EXAM: PORTABLE CHEST 1 VIEW COMPARISON:  Radiograph of May 07, 2018. FINDINGS: The heart size and mediastinal contours are within normal limits. Both lungs are clear. Endotracheal and nasogastric tubes are unchanged in position. Right internal jugular Port-A-Cath is unchanged in position. No pneumothorax or pleural effusion is noted. Old left rib fractures are noted. IMPRESSION: Stable support apparatus. No acute cardiopulmonary abnormality seen. Electronically Signed   By: Marijo Conception M.D.   On: 05/09/2018 13:02    Anti-infectives: Anti-infectives (From admission, onward)   Start     Dose/Rate Route Frequency Ordered Stop   05/04/18 2100  vancomycin (VANCOCIN) IVPB 750 mg/150 ml premix     750 mg 150 mL/hr over 60 Minutes Intravenous Every 12 hours 05/04/18 2027     05/03/18 1000  fluconazole (DIFLUCAN) IVPB 400 mg  Status:  Discontinued     400 mg 100 mL/hr over 120 Minutes Intravenous Every 24 hours 05/02/18 0721 05/02/18 0855   05/02/18 0800  vancomycin (VANCOCIN) 500 mg in sodium chloride 0.9 % 100 mL IVPB  Status:  Discontinued     500 mg 100 mL/hr over 60 Minutes Intravenous Every 12 hours 05/02/18 0721 05/04/18 2027   05/02/18 0800  piperacillin-tazobactam (ZOSYN) IVPB 3.375 g     3.375 g 12.5 mL/hr over 240 Minutes Intravenous Every 8 hours 05/02/18 0721     05/02/18 0730  fluconazole (DIFLUCAN) IVPB 800 mg     800 mg 200 mL/hr over 120 Minutes Intravenous  Once 05/02/18 0721 05/02/18 0952   04/30/18 2230  vancomycin (VANCOCIN) IVPB 750  mg/150 ml premix  Status:  Discontinued     750 mg 150 mL/hr over 60 Minutes Intravenous Every 48 hours 04/28/18 2133 04/29/18 1121   04/30/18 2230  vancomycin (VANCOCIN) IVPB 1000 mg/200 mL premix  Status:  Discontinued     1,000 mg 200 mL/hr over 60 Minutes Intravenous Every 48 hours 04/29/18 1121 04/30/18 0904   04/30/18 1000  vancomycin (VANCOCIN) IVPB 1000 mg/200 mL premix     1,000 mg 200 mL/hr over 60 Minutes Intravenous Every 24 hours 04/30/18 0904 05/01/18 1011   04/30/18 0915  ceFEPIme (MAXIPIME) 2 g in sodium chloride 0.9 % 100 mL IVPB  Status:  Discontinued     2 g 200 mL/hr over 30 Minutes Intravenous Every 12 hours 04/30/18 0905 05/02/18 0721   04/28/18 2130  ceFEPIme (MAXIPIME) 2 g in sodium chloride 0.9 % 100 mL IVPB  Status:  Discontinued     2 g 200 mL/hr over 30 Minutes Intravenous Every 24 hours 04/28/18 2113 04/30/18 0905   04/28/18 2130  vancomycin (VANCOCIN) 1,250 mg in sodium chloride 0.9 % 250 mL IVPB     1,250 mg 166.7 mL/hr over 90 Minutes Intravenous  Once 04/28/18 2129 04/29/18 0107   04/25/18 1530  ceFAZolin (ANCEF) IVPB 2g/100 mL premix     2 g 200 mL/hr over 30 Minutes Intravenous Every 8 hours 04/25/18 1520 04/25/18 1739   04/25/18 0600  ceFAZolin (ANCEF) IVPB 2g/100 mL premix     2 g 200 mL/hr over 30 Minutes Intravenous On call to O.R. 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  1 LN positive and vein margin positive.       Neuro- improved.  Continue enteral morphine.    Pulm/ID- reintubated for respiratory distress and increased WOB.  Zosyn/vanc should cover respiratory or GI pathogens.   Drain lipase very low, but drain #2 now milky.  Fever curve decreased; likely secondary to liver necrosis and pneumonia.  Continue coverage for pneumonia and biliary pathogens which include gram negs, anerobes, MRSA and enterococcus.  Anticipate 2 week course of antibiotics.    CV- tachycardia stable.  Pt young.  Appears reactive in nature. Staying off pressors.    GI-   LFTs back to normal.  Still infarction pattern on scan.  No sign of abscess. Previous liver duplex showed patent portal and hepatic veins.  Tube feeds at goal. One drain now appearing cloudy.     Renal- cr now back to baseline.  Has diuresed quite a bit.    FEN: hypokalemia present again.  Add enteral KCl.  Hypernatremia - mild, stable.    Endocrine- hyperglycemia, continue ssi, glucose under reasonable control. At risk of developing diabetes given amount of pancreas removed.  Will see how blood sugars settle out once acute stress improved.    Heme:  ABL anemia on anemia of chronic dx, anemia of chemotherapy.  stable  VTE ppx- SCDs, lovenox ppx  CCM managing  vent/sedation/HCAP   Global: significantly improved mental status with lactulose.  Will decrease dose given level of response.  CCM managing gtts/vent.  WBCs still elevated, still intermittently febrile- assume liver necrosis.     LOS: 16 days    Stark Klein 05/11/2018

## 2018-05-12 ENCOUNTER — Inpatient Hospital Stay (HOSPITAL_COMMUNITY): Payer: Medicaid Other

## 2018-05-12 LAB — CBC WITH DIFFERENTIAL/PLATELET
Abs Immature Granulocytes: 0.08 10*3/uL — ABNORMAL HIGH (ref 0.00–0.07)
Basophils Absolute: 0 10*3/uL (ref 0.0–0.1)
Basophils Relative: 0 %
Eosinophils Absolute: 0.2 10*3/uL (ref 0.0–0.5)
Eosinophils Relative: 2 %
HCT: 22.7 % — ABNORMAL LOW (ref 36.0–46.0)
Hemoglobin: 7 g/dL — ABNORMAL LOW (ref 12.0–15.0)
Immature Granulocytes: 1 %
Lymphocytes Relative: 11 %
Lymphs Abs: 1 10*3/uL (ref 0.7–4.0)
MCH: 24.5 pg — ABNORMAL LOW (ref 26.0–34.0)
MCHC: 30.8 g/dL (ref 30.0–36.0)
MCV: 79.4 fL — ABNORMAL LOW (ref 80.0–100.0)
Monocytes Absolute: 1 10*3/uL (ref 0.1–1.0)
Monocytes Relative: 11 %
Neutro Abs: 6.8 10*3/uL (ref 1.7–7.7)
Neutrophils Relative %: 75 %
Platelets: 323 10*3/uL (ref 150–400)
RBC: 2.86 MIL/uL — ABNORMAL LOW (ref 3.87–5.11)
WBC: 9.1 10*3/uL (ref 4.0–10.5)
nRBC: 0.2 % (ref 0.0–0.2)

## 2018-05-12 LAB — GLUCOSE, CAPILLARY
Glucose-Capillary: 111 mg/dL — ABNORMAL HIGH (ref 70–99)
Glucose-Capillary: 124 mg/dL — ABNORMAL HIGH (ref 70–99)
Glucose-Capillary: 138 mg/dL — ABNORMAL HIGH (ref 70–99)
Glucose-Capillary: 141 mg/dL — ABNORMAL HIGH (ref 70–99)
Glucose-Capillary: 152 mg/dL — ABNORMAL HIGH (ref 70–99)
Glucose-Capillary: 153 mg/dL — ABNORMAL HIGH (ref 70–99)

## 2018-05-12 LAB — COMPREHENSIVE METABOLIC PANEL
ALT: 29 U/L (ref 0–44)
AST: 36 U/L (ref 15–41)
Albumin: 2.1 g/dL — ABNORMAL LOW (ref 3.5–5.0)
Alkaline Phosphatase: 106 U/L (ref 38–126)
Anion gap: 8 (ref 5–15)
BUN: 8 mg/dL (ref 6–20)
CO2: 23 mmol/L (ref 22–32)
Calcium: 8 mg/dL — ABNORMAL LOW (ref 8.9–10.3)
Chloride: 111 mmol/L (ref 98–111)
Creatinine, Ser: 0.65 mg/dL (ref 0.44–1.00)
GFR calc Af Amer: >60 mL/min (ref >60)
GFR calc non Af Amer: >60 mL/min (ref >60)
Glucose, Bld: 123 mg/dL — ABNORMAL HIGH (ref 70–99)
Potassium: 3.3 mmol/L — ABNORMAL LOW (ref 3.5–5.1)
Sodium: 142 mmol/L (ref 135–145)
Total Bilirubin: 0.9 mg/dL (ref 0.3–1.2)
Total Protein: 6.3 g/dL — ABNORMAL LOW (ref 6.5–8.1)

## 2018-05-12 LAB — AMMONIA: Ammonia: 24 umol/L (ref 9–35)

## 2018-05-12 LAB — MAGNESIUM: Magnesium: 2 mg/dL (ref 1.7–2.4)

## 2018-05-12 MED ORDER — POTASSIUM CHLORIDE 20 MEQ/15ML (10%) PO SOLN
40.0000 meq | Freq: Once | ORAL | Status: AC
  Administered 2018-05-12: 09:00:00 40 meq
  Filled 2018-05-12: qty 30

## 2018-05-12 MED ORDER — MORPHINE SULFATE 15 MG PO TABS
15.0000 mg | ORAL_TABLET | Freq: Four times a day (QID) | ORAL | Status: DC
  Administered 2018-05-12 – 2018-05-14 (×7): 15 mg via ORAL
  Filled 2018-05-12 (×7): qty 1

## 2018-05-12 MED ORDER — IPRATROPIUM-ALBUTEROL 0.5-2.5 (3) MG/3ML IN SOLN
3.0000 mL | Freq: Three times a day (TID) | RESPIRATORY_TRACT | Status: DC
  Administered 2018-05-12 – 2018-05-13 (×4): 3 mL via RESPIRATORY_TRACT
  Filled 2018-05-12 (×4): qty 3

## 2018-05-12 NOTE — Evaluation (Signed)
Physical Therapy Evaluation Patient Details Name: Jane Brooks MRN: [id number] DOB: 1970/12/15 Today's Date: 05/12/2018   History of Present Illness  48 y/o female with pancreatic adenocarcinoma s/p chemotherapy admitted for planned whipple on 4/9. PCCM consulted for post-op hypotension, AKI and shock liver. Self-extubated on 4/10 however developed respiratory distress secondary to HCAP and was reintubated on 4/15 and extubated on 4/25.  Clinical Impression  Pt admitted with above. Pt very confused and not oriented to situation or place or city/state. Pt with decreased insight to deficits and safety awareness. Pt was living alone in an apt with her dog PTA. Pt now requires assist for all mobility and ADLs. Recommend CIR upon d/c for maximal functional recovery. Acute PT to cont to follow.    Follow Up Recommendations CIR    Equipment Recommendations  Rolling walker with 5" wheels    Recommendations for Other Services Rehab consult     Precautions / Restrictions Precautions Precautions: Fall;Other (comment) Precaution Comments: Poor awareness Restrictions Weight Bearing Restrictions: No      Mobility  Bed Mobility               General bed mobility comments: pt sitting up in chair upon PT arrival  Transfers Overall transfer level: Needs assistance Equipment used: Rolling walker (2 wheeled) Transfers: Sit to/from Stand Sit to Stand: Min assist;+2 safety/equipment         General transfer comment: max directional verbal cues for hand placement and safety, minA to power up, pt given RW which instantly assisted with posterior lean bringing her forward  Ambulation/Gait Ambulation/Gait assistance: Min assist;+2 safety/equipment Gait Distance (Feet): 150 Feet Assistive device: Rolling walker (2 wheeled) Gait Pattern/deviations: Step-through pattern;Decreased stride length;Narrow base of support Gait velocity: dec Gait velocity interpretation: <1.31 ft/sec, indicative  of household ambulator General Gait Details: slow and guarded, minA for walker management, RN assisted with lines, pt then had an episode of stool incontinence during ambulation, pt able to stand statically with bilat UEs on walker x 5 min for hygiene and changing of socks  Stairs            Wheelchair Mobility    Modified Rankin (Stroke Patients Only)       Balance Overall balance assessment: Needs assistance Sitting-balance support: No upper extremity supported;Feet supported;Single extremity supported Sitting balance-Leahy Scale: Fair Sitting balance - Comments: Able to sit at EOb with close Min Guard A for safety   Standing balance support: During functional activity;Bilateral upper extremity supported Standing balance-Leahy Scale: Poor Standing balance comment: Reliant on UE support and physical A due to posterior lean                             Pertinent Vitals/Pain Pain Assessment: No/denies pain Faces Pain Scale: No hurt    Home Living Family/patient expects to be discharged to:: Private residence Living Arrangements: Alone Available Help at Discharge: Family;Available 24 hours/day Type of Home: Apartment(First) Home Access: Level entry       Home Equipment: None Additional Comments: with her dog    Prior Function Level of Independence: Independent         Comments: Worked at a factory in [place] Hand: Right    Extremity/Trunk Assessment   Upper Extremity Assessment Upper Extremity Assessment: Defer to OT evaluation    Lower Extremity Assessment Lower Extremity Assessment: Generalized weakness    Cervical / Trunk Assessment Cervical /  Trunk Assessment: Other exceptions Cervical / Trunk Exceptions: large abdominal surgery  Communication   Communication: No difficulties  Cognition Arousal/Alertness: Awake/alert Behavior During Therapy: Impulsive Overall Cognitive Status: Impaired/Different  from baseline Area of Impairment: Attention;Memory;Following commands;Safety/judgement;Awareness;Orientation;Problem solving                 Orientation Level: Disoriented to;Time;Situation(pt thinks she is in [place]) Current Attention Level: Sustained Memory: Decreased short-term memory Following Commands: Follows one step commands inconsistently;Follows one step commands with increased time Safety/Judgement: Decreased awareness of safety;Decreased awareness of deficits Awareness: Emergent Problem Solving: Slow processing;Difficulty sequencing;Requires verbal cues General Comments: pt stating her sister lives right down the street and they live in [place], but then stated her sister lives in [place] and thats why she's here, then when re-oriented to [place], pt shocked but then stated she lived in [place] but doesn't know how she got here and her sister lives in [place] comments (skin integrity, edema, etc.): VSS. HR up to 133    Exercises     Assessment/Plan    PT Assessment Patient needs continued PT services  PT Problem List Decreased strength;Decreased range of motion;Decreased activity tolerance;Decreased balance;Decreased mobility;Decreased coordination;Decreased cognition;Decreased knowledge of use of DME;Decreased safety awareness       PT Treatment Interventions DME instruction;Gait training;Stair training;Functional mobility training;Therapeutic activities;Therapeutic exercise;Balance training;Neuromuscular re-education    PT Goals (Current goals can be found in the Care Plan section)  Acute Rehab PT Goals Patient Stated Goal: "Get back home" PT Goal Formulation: With patient Time For Goal Achievement: 05/26/18 Potential to Achieve Goals: Good    Frequency Min 3X/week   Barriers to discharge Decreased caregiver support      Co-evaluation               AM-PAC PT "6 Clicks" Mobility  Outcome  Measure Help needed turning from your back to your side while in a flat bed without using bedrails?: A Little Help needed moving from lying on your back to sitting on the side of a flat bed without using bedrails?: A Little Help needed moving to and from a bed to a chair (including a wheelchair)?: A Little Help needed standing up from a chair using your arms (e.g., wheelchair or bedside chair)?: A Little Help needed to walk in hospital room?: A Lot Help needed climbing 3-5 steps with a railing? : A Lot 6 Click Score: 16    End of Session Equipment Utilized During Treatment: Gait belt Activity Tolerance: Patient tolerated treatment well Patient left: in chair;with call bell/phone within reach;with nursing/sitter in room;with chair alarm set Nurse Communication: Mobility status(RN present during tx) PT Visit Diagnosis: Unsteadiness on feet (R26.81);Muscle weakness (generalized) (M62.81);Difficulty in walking, not elsewhere classified (R26.2)    Time: 1045-1110 PT Time Calculation (min) (ACUTE ONLY): 25 min   Charges:   PT Evaluation $PT Eval Moderate Complexity: 1 Mod PT Treatments $Gait Training: 8-22 mins        Kittie Plater, PT, DPT Acute Rehabilitation Services Pager #: [phone] Office #: [phone]   Berline Lopes 05/12/2018, 1:42 PM

## 2018-05-12 NOTE — Evaluation (Signed)
Clinical/Bedside Swallow Evaluation Patient Details  Name: Jane Brooks MRN: [id number] Date of Birth: 09-03-1970  Today's Date: 05/12/2018 Time: SLP Start Time (ACUTE ONLY): 0850 SLP Stop Time (ACUTE ONLY): 0910 SLP Time Calculation (min) (ACUTE ONLY): 20 min  Past Medical History:  Past Medical History:  Diagnosis Date  . Family history of lung cancer   . Family history of prostate cancer   . Family history of uterine cancer   . Gallstones 10/2017  . Pancreatic cancer ([place])   . Pneumothorax, closed, traumatic    years ago  . PONV (postoperative nausea and vomiting)    Past Surgical History:  Past Surgical History:  Procedure Laterality Date  . BILIARY STENT PLACEMENT  10/31/2017   Procedure: BILIARY STENT PLACEMENT;  Surgeon: [name], MD;  Location[facility] ENDOSCOPY;  Service: Gastroenterology;;  . ERCP N/A 10/31/2017   Procedure: ENDOSCOPIC RETROGRADE CHOLANGIOPANCREATOGRAPHY (ERCP);  Surgeon: [name], MD;  Location: [facility] ENDOSCOPY;  Service: Gastroenterology;  Laterality: N/A;  . ESOPHAGOGASTRODUODENOSCOPY N/A 11/08/2017   Procedure: ESOPHAGOGASTRODUODENOSCOPY (EGD);  Surgeon: Milus Banister, MD;  Location: [facility] ENDOSCOPY;  Service: Endoscopy;  Laterality: N/A;  . EUS N/A 11/08/2017   Procedure: UPPER ENDOSCOPIC ULTRASOUND (EUS) RADIAL;  Surgeon: Milus Banister, MD;  Location: [facility] ENDOSCOPY;  Service: Endoscopy;  Laterality: N/A;  . FINE NEEDLE ASPIRATION  11/08/2017   Procedure: FINE NEEDLE ASPIRATION;  Surgeon: Milus Banister, MD;  Location: [facility] ENDOSCOPY;  Service: Endoscopy;;  . IR IMAGING GUIDED PORT INSERTION  11/15/2017  . LAPAROSCOPY N/A 04/25/2018   Procedure: LAPAROSCOPY DIAGNOSTIC;  Surgeon: Stark Klein, MD;  Location: [place];  Service: General;  Laterality: N/A;  GENERAL AND EPIDURAL ANESTHESIA  . SPHINCTEROTOMY  10/31/2017   Procedure: SPHINCTEROTOMY;  Surgeon: [name], MD;  Location: [facility] ENDOSCOPY;  Service: Gastroenterology;;  . TONSILLECTOMY     . WHIPPLE PROCEDURE N/A 04/25/2018   Procedure: WHIPPLE PROCEDURE, RECONSTRUCTION OF PORTAL VEIN;  Surgeon: Stark Klein, MD;  Location: [place];  Service: General;  Laterality: N/A;   HPI:  48 year old female admitted 04/25/2018 for Whipple procedure. Intubated 4/9-10 and 4/15-25. PMH: pancreatic cancer s/p chemo.   Assessment / Plan / Recommendation Clinical Impression  Pt was seen for evaluation of swallow function and safety at bedside following extended intubation. Pt presents with slightly breathy voice quality, likely due to intubation. CN exam is within normal limits, and pt reports no history of swallowing difficulty. Pt passed the Yale swallow screen without apparent difficulty, drinking multiple cups of water and apple juice with only slight belching in response. No changes in voice quality or respiratory status. Recommend beginning clear liquids (per surgery). SLP will follow closely for diet tolerance assessment given increased risk associated with extended intubation. RN and MD informed of results and recommendations.     SLP Visit Diagnosis: Dysphagia, unspecified (R13.10)    Aspiration Risk  Mild aspiration risk;Moderate aspiration risk    Diet Recommendation Thin liquid(clear liquids for now per surgery)   Liquid Administration via: Cup;Straw Medication Administration: Whole meds with liquid Supervision: Patient able to self feed;Intermittent supervision to cue for compensatory strategies Compensations: Minimize environmental distractions;Slow rate;Small sips/bites Postural Changes: Seated upright at 90 degrees    Other  Recommendations Oral Care Recommendations: Oral care BID   Follow up Recommendations Inpatient Rehab      Frequency and Duration min 2x/week  2 weeks;1 week       Prognosis Prognosis for Safe Diet Advancement: Good Barriers to Reach Goals: Cognitive deficits  Swallow Study   General Date of Onset: 04/25/18 HPI: 48 year old female admitted  04/25/2018 for Whipple procedure. Intubated 4/9-10 and 4/15-25. PMH: pancreatic cancer s/p chemo. Type of Study: Bedside Swallow Evaluation Previous Swallow Assessment: none Diet Prior to this Study: NPO;PEG tube Temperature Spikes Noted: No Respiratory Status: Room air History of Recent Intubation: Yes Length of Intubations (days): 12 days Date extubated: 05/11/18 Behavior/Cognition: Alert;Pleasant mood;Cooperative;Confused Oral Cavity Assessment: Within Functional Limits Oral Care Completed by SLP: Recent completion by staff Oral Cavity - Dentition: Adequate natural dentition Vision: Functional for self-feeding Self-Feeding Abilities: Able to feed self Patient Positioning: Upright in chair Baseline Vocal Quality: Breathy Volitional Cough: Strong Volitional Swallow: Able to elicit    Oral/Motor/Sensory Function Overall Oral Motor/Sensory Function: Within functional limits   Ice Chips Ice chips: Within functional limits Presentation: Spoon;Self Fed   Thin Liquid Thin Liquid: Within functional limits Presentation: Cup;Straw Other Comments: Pt passed Yale swallow screen    Nectar Thick Nectar Thick Liquid: Not tested   Honey Thick Honey Thick Liquid: Not tested   Puree Puree: Not tested Other Comments: pt currently limited to clear liquids.   Solid     Solid: Not tested Other Comments: pt currently limited to clear liquids     Oluwatamilore Starnes B. Quentin Ore, [facility], [place] Speech Language Pathologist [phone]  Shonna Chock 05/12/2018,12:51 PM

## 2018-05-12 NOTE — Progress Notes (Addendum)
NAME:  Jane Brooks, MRN:  [id number], DOB:  September 28, 1970, LOS: 69 ADMISSION DATE:  04/25/2018, CONSULTATION DATE:  04/25/18 REFERRING MD:  Barry Dienes, MD CHIEF COMPLAINT:  Acute hypoxemic respiratory failure  Brief History   48 y/o female with pancreatic adenocarcinoma s/p chemotherapy admitted for planned whipple on 4/9. PCCM consulted for post-op hypotension, AKI and shock liver. Extubated on 4/10 however developed respiratory distress secondary to HCAP and was reintubated on 4/15.  Significant Hospital Events   4/09 Laparotomy 4/10 Respiratory distress 4/15 Re-intubated 4/20 Some PSV 10/5,  Morphine gtt infusing.    Consults:  PCCM  Procedures:  ETT 4/9 >> 4/10 ETT 4/15 >>  Significant Diagnostic Tests:  CT A/P 4/3 >>  Panceratic mass with duct dilation CTA 4/9 >> no PE, s/p pancreas head mass removal, s/p pancreatic stent CT Chest/ABD/Pelvis 4/17 >>  PNA; low attenuation 9cm right lobe of liver MRI ABD 4/17 >> favors biliary necrosis.   Micro Data:  [place] 4/12 >> negative  BCX 4/16 >> NEG  Tracheal aspirate 4/16 >> few candida UC 4/19 >> negative  PEG drainage 4/24>>    Antimicrobials:  Cefazolin 4/9 x1 Cefepime 4/12 >> 4/16 Vanc 4/12 >> Zosyn 4/16 >>   Interim history/subjective:  Extubated 4/25, done well with good O2 sats on room air  and no increased wob .  TMax 101.5 , wbc tr down  hbg 7.0 , no active signs of bleeding  K+ 3.3  Urinary retention , foley 4/25  Waiting on ST consult for swallow eval, if approved can proceed to clear liquids per surg.  Getting up this am with OT to chair  Mentation improved , intermittent confusion . Follows all commands, very strong.  Slid out of bed yesterday, no apparent injury per RN  Decreased diarrhea , to remove flexi today    Objective   Blood pressure (!) 141/77, pulse (!) 121, temperature 99.6 F (37.6 C), temperature source Axillary, resp. rate (!) 23, height 4' 11"  (1.499 m), weight 59.9 kg, SpO2 99 %.         Intake/Output Summary (Last 24 hours) at 05/12/2018 0807 Last data filed at 05/12/2018 0600 Gross per 24 hour  Intake 3290.84 ml  Output 2660 ml  Net 630.84 ml   Filed Weights   05/09/18 0500 05/10/18 0500 05/11/18 0500  Weight: 57.6 kg 61.2 kg 59.9 kg    Physical Exam: General: adult female sitting up on side of bed this am. Very alert  HEENT: MM pink/moist, ATNC  Neuro: awake , alert , follows commands. MAEx4  CV: ST , no mrg  PULM: CTA , no wheezing  NH:AFBX, NT , BS x 4 , abd binder, JP drain, Peg -TF  Extremities: warm/dry, trace edema , strong grips /foot presses  Skin: no rashes or lesions, warm and dry with surgical incision, tattoos  Resolved Hospital Problem list   AKI Hypotension  Assessment & Plan:   Acute Hypoxemic Respiratory Failure -resolved, extubated 4/25  HCAP>> resolving Weaning 10/5 Current every day smoker 37 pack years No maintenance inhaler therapy No PFT's 4/26 CXR improved aeration , bilateral opacities stable  P: Titrate O2 for O2 sats >90%   Follow intermittent CXR  Duoneb     Encephalopathy -improving  Opens eyes, nods head, following commands Ammonia Level 92>> 32>24  after lactulose CT Head without acute abnormality Plan Cont  Lactulose to 20 BID  Trend ammonia levels Neuro checks per unit routine   Pancreatic Head Cancer s/p Whipple P: Per  CCS Enteral Morphine  Monitor renal function closely with narcotics TF per nutrition  Trend Micro from JP culture 4/24 ST eval for swallow, if pass, advance diet to clear liquid   Fever/ Leukocytosis Plan Trend fever curve/ WBC ABX per surgery Follow micro, narrow ABX as able Re-culture as is clinically indicated  At Risk AKI Hypokalemia Hypophosphatemia-improved  Hypernatremia-resolved  Calcium corrects to 9.8 on 4/25  P: Decrease free water 200 ml Q 8 Trend BMP / urinary output Replace electrolytes as indicated Avoid nephrotoxic agents, ensure adequate renal perfusion  D51/2NS with 41mq KCL at 468mhr  Replace K+   Shock Liver -? bililary necrosis on MRI - LFT's normalizing - Ammonia 92>24  P: Trend LFT's, INR  Lactulose  Trend Ammonia   Best practice:  Diet: TF Pain/Anxiety/Delirium protocol (if indicated): Yes VAP protocol (if indicated): na  DVT prophylaxis: lovenox GI prophylaxis: PPI Glucose control: SSI Mobility: up w/ assist  Code Status: Full Family Communication: discussed with Sister, Ms. MuValere Drossith update 4/26.  Disposition: ICU  4/26 : PCCM to sign off. Call As needed         Labs   CBC: Recent Labs  Lab 05/08/18 0521 05/09/18 0425 05/10/18 0430 05/11/18 0542 05/12/18 0409  WBC 14.2* 15.9* 15.4* 11.2* 9.1  NEUTROABS  --   --   --   --  6.8  HGB 7.7* 7.7* 7.6* 7.2* 7.0*  HCT 24.8* 24.9* 24.4* 23.4* 22.7*  MCV 79.7* 79.8* 79.5* 79.6* 79.4*  PLT 263 302 311 PLATELET CLUMPS NOTED ON SMEAR, COUNT APPEARS ADEQUATE 32170  Basic Metabolic Panel: Recent Labs  Lab 05/08/18 0521 05/09/18 0425 05/10/18 0430 05/11/18 0542 05/12/18 0409  NA 147* 147* 142 142 142  K 4.0 3.7 3.7 3.2* 3.3*  CL 110 112* 110 108 111  CO2 26 26 24 24 23   GLUCOSE 147* 129* 123* 130* 123*  BUN 16 18 15 12 8   CREATININE 1.02* 0.79 0.60 0.62 0.65  CALCIUM 8.1* 8.1* 7.8* 8.1* 8.0*  MG  --   --  1.9  --  2.0  PHOS  --   --  3.1  --   --    GFR: Estimated Creatinine Clearance: 67 mL/min (by C-G formula based on SCr of 0.65 mg/dL). Recent Labs  Lab 05/09/18 0425 05/10/18 0430 05/11/18 0542 05/12/18 0409  WBC 15.9* 15.4* 11.2* 9.1    Liver Function Tests: Recent Labs  Lab 05/08/18 0521 05/10/18 0430 05/12/18 0409  AST 25 31 36  ALT 29 29 29   ALKPHOS 91 95 106  BILITOT 1.1 1.1 0.9  PROT 6.6 6.2* 6.3*  ALBUMIN 2.0* 1.9* 2.1*   No results for input(s): LIPASE, AMYLASE in the last 168 hours. Recent Labs  Lab 05/10/18 0430 05/11/18 0542 05/12/18 0450  AMMONIA 92* 32 24    ABG    Component Value Date/Time   PHART  7.436 05/05/2018 1738   PCO2ART 37.1 05/05/2018 1738   PO2ART 71.0 (L) 05/05/2018 1738   HCO3 25.0 05/05/2018 1738   TCO2 26 05/05/2018 1738   ACIDBASEDEF 10.7 (H) 04/25/2018 2119   O2SAT 95.0 05/05/2018 1738     Coagulation Profile: Recent Labs  Lab 05/10/18 0430 05/11/18 0542  INR 1.3* 1.4*    Cardiac Enzymes: No results for input(s): CKTOTAL, CKMB, CKMBINDEX, TROPONINI in the last 168 hours.  HbA1C: No results found for: HGBA1C  CBG: Recent Labs  Lab 05/11/18 1514 05/11/18 1947 05/11/18 2324 05/12/18 0351 05/12/18 0743  GLUCAP 114*  123* 127* 111* 138*    Critical care time: 35 min     Tammy Parrett NP-C  [facility]  [phone]  05/12/2018   05/12/2018, 8:07 AM

## 2018-05-12 NOTE — Progress Notes (Signed)
Rehab Admissions Coordinator Note:  Per OT recommendation, this patient was screened by Jhonnie Garner for appropriateness for an Inpatient Acute Rehab Consult.  At this time, we are recommending Inpatient Rehab consult. AC will contact MD to request an IP rehab consult order.   Jhonnie Garner 05/12/2018, 11:06 AM  I can be reached at [phone].

## 2018-05-12 NOTE — Progress Notes (Addendum)
17 Days Post-Op   Subjective/Chief Complaint: Reports she is less confused   Objective: Vital signs in last 24 hours: Temp:  [98.2 F (36.8 C)-101.5 F (38.6 C)] 99.6 F (37.6 C) (04/26 0400) Pulse Rate:  [113-133] 121 (04/26 0700) Resp:  [16-27] 23 (04/26 0700) BP: (131-186)/(70-95) 141/77 (04/26 0700) SpO2:  [95 %-100 %] 99 % (04/26 0700) Last BM Date: 05/11/18  Intake/Output from previous day: 04/25 0701 - 04/26 0700 In: 3290.8 [P.O.:562; I.V.:839.2; QQ/PY:1950; IV Piggyback:54.7] Out: 2660 [Urine:1160; Drains:500; Stool:1000] Intake/Output this shift: No intake/output data recorded.  General appearance: cooperative Eyes: no icterus Resp: clear to auscultation bilaterally Cardio: regular rate and rhythm GI: soft, incision healed, TF going Extremities: calves soft  Lab Results:  Recent Labs    05/11/18 0542 05/12/18 0409  WBC 11.2* 9.1  HGB 7.2* 7.0*  HCT 23.4* 22.7*  PLT PLATELET CLUMPS NOTED ON SMEAR, COUNT APPEARS ADEQUATE 323   BMET Recent Labs    05/11/18 0542 05/12/18 0409  NA 142 142  K 3.2* 3.3*  CL 108 111  CO2 24 23  GLUCOSE 130* 123*  BUN 12 8  CREATININE 0.62 0.65  CALCIUM 8.1* 8.0*   PT/INR Recent Labs    05/10/18 0430 05/11/18 0542  LABPROT 16.2* 16.6*  INR 1.3* 1.4*   ABG No results for input(s): PHART, HCO3 in the last 72 hours.  Invalid input(s): PCO2, PO2  Studies/Results: Dg Chest Port 1 View  Result Date: 05/12/2018 CLINICAL DATA:  Respiratory failure.  Pancreatic adenocarcinoma. EXAM: PORTABLE CHEST 1 VIEW COMPARISON:  05/11/2018. FINDINGS: Nasogastric tube has been removed. Endotracheal tube has been removed. Unchanged and normal cardiomediastinal silhouette. Port-A-Cath stable, tip proximal RIGHT atrium. Healed LEFT-sided rib fractures. BILATERAL diffuse lung opacities continue to show mild improvement. LEFT basilar subsegmental atelectasis is less prominent. No effusions. IMPRESSION: Slight improvement aeration.  Electronically Signed   By: Staci Righter M.D.   On: 05/12/2018 07:28   Dg Chest Port 1 View  Result Date: 05/11/2018 CLINICAL DATA:  Respiratory distress.  Pancreatic adenocarcinoma. EXAM: PORTABLE CHEST 1 VIEW COMPARISON:  05/10/2018. FINDINGS: Endotracheal tube and nasogastric tube in good position, unchanged. Port-A-Cath unchanged. Normal heart size. BILATERAL diffuse lung opacities are stable to improved. Mild subsegmental atelectasis at the LEFT base redemonstrated. Healed rib fractures. IMPRESSION: Improved aeration. Electronically Signed   By: Staci Righter M.D.   On: 05/11/2018 07:20    Anti-infectives: Anti-infectives (From admission, onward)   Start     Dose/Rate Route Frequency Ordered Stop   05/04/18 2100  vancomycin (VANCOCIN) IVPB 750 mg/150 ml premix     750 mg 150 mL/hr over 60 Minutes Intravenous Every 12 hours 05/04/18 2027     05/03/18 1000  fluconazole (DIFLUCAN) IVPB 400 mg  Status:  Discontinued     400 mg 100 mL/hr over 120 Minutes Intravenous Every 24 hours 05/02/18 0721 05/02/18 0855   05/02/18 0800  vancomycin (VANCOCIN) 500 mg in sodium chloride 0.9 % 100 mL IVPB  Status:  Discontinued     500 mg 100 mL/hr over 60 Minutes Intravenous Every 12 hours 05/02/18 0721 05/04/18 2027   05/02/18 0800  piperacillin-tazobactam (ZOSYN) IVPB 3.375 g     3.375 g 12.5 mL/hr over 240 Minutes Intravenous Every 8 hours 05/02/18 0721     05/02/18 0730  fluconazole (DIFLUCAN) IVPB 800 mg     800 mg 200 mL/hr over 120 Minutes Intravenous  Once 05/02/18 0721 05/02/18 0952   04/30/18 2230  vancomycin (VANCOCIN) IVPB 750 mg/150  ml premix  Status:  Discontinued     750 mg 150 mL/hr over 60 Minutes Intravenous Every 48 hours 04/28/18 2133 04/29/18 1121   04/30/18 2230  vancomycin (VANCOCIN) IVPB 1000 mg/200 mL premix  Status:  Discontinued     1,000 mg 200 mL/hr over 60 Minutes Intravenous Every 48 hours 04/29/18 1121 04/30/18 0904   04/30/18 1000  vancomycin (VANCOCIN) IVPB 1000  mg/200 mL premix     1,000 mg 200 mL/hr over 60 Minutes Intravenous Every 24 hours 04/30/18 0904 05/01/18 1011   04/30/18 0915  ceFEPIme (MAXIPIME) 2 g in sodium chloride 0.9 % 100 mL IVPB  Status:  Discontinued     2 g 200 mL/hr over 30 Minutes Intravenous Every 12 hours 04/30/18 0905 05/02/18 0721   04/28/18 2130  ceFEPIme (MAXIPIME) 2 g in sodium chloride 0.9 % 100 mL IVPB  Status:  Discontinued     2 g 200 mL/hr over 30 Minutes Intravenous Every 24 hours 04/28/18 2113 04/30/18 0905   04/28/18 2130  vancomycin (VANCOCIN) 1,250 mg in sodium chloride 0.9 % 250 mL IVPB     1,250 mg 166.7 mL/hr over 90 Minutes Intravenous  Once 04/28/18 2129 04/29/18 0107   04/25/18 1530  ceFAZolin (ANCEF) IVPB 2g/100 mL premix     2 g 200 mL/hr over 30 Minutes Intravenous Every 8 hours 04/25/18 1520 04/25/18 1739   04/25/18 0600  ceFAZolin (ANCEF) IVPB 2g/100 mL premix     2 g 200 mL/hr over 30 Minutes Intravenous On call to O.R. 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  1 LN positive and vein margin positive.      Neuro- improved.  Continue enteral morphine.    Pulm/ID- reintubated for respiratory distress and increased WOB.  Zosyn/vanc should cover respiratory or GI pathogens.   Drain lipase very low, but drain #2 now milky.  Fever curve decreased; likely secondary to liver necrosis and pneumonia. Continue coverage for pneumonia and biliary pathogens which include gram negs, anerobes, MRSA and enterococcus.  Anticipate 2 week course of antibiotics.    CV- tachycardia stable.  Pt young.  Appears reactive in nature. Staying off pressors.    GI-   LFTs back to normal.  Still infarction pattern on scan.  No sign of abscess. Previous liver duplex showed patent portal and hepatic veins.  Tube feeds at goal. One drain now appearing cloudy.     Renal- cr now back to baseline.  Has diuresed quite a bit.    FEN:  hypokalemia present again.  Add enteral KCl.  Hypernatremia - mild, stable.    Endocrine- hyperglycemia, continue ssi, glucose under reasonable control. At risk of developing diabetes given amount of pancreas removed.  Will see how blood sugars settle out once acute stress improved.    Heme:  ABL anemia on anemia of chronic dx, anemia of chemotherapy.  stable  VTE ppx- SCDs, lovenox ppx  Plan: continue antibiotics and lactulose, PT/OT, NH4 down to 24  LOS: 17 days    Zenovia Jarred 05/12/2018

## 2018-05-12 NOTE — Evaluation (Signed)
Occupational Therapy Evaluation Patient Details Name: Jane Brooks MRN: [id number] DOB: 06/10/1970 Today's Date: 05/12/2018    History of Present Illness 48 y/o female with pancreatic adenocarcinoma s/p chemotherapy admitted for planned whipple on 4/9. PCCM consulted for post-op hypotension, AKI and shock liver. Self-extubated on 4/10 however developed respiratory distress secondary to HCAP and was reintubated on 4/15 and extubated on 4/25.    Clinical Impression   PTA, pt reports she was living alone and was independent and working full time; reports family and friends can assist at [employer]. Pt currently requires Min A for UB ADLs, Mod A for LB ADLs, and Mod A for functional mobility with RW. Pt presenting with poor safety awareness, cognition, balance, coordination, and strength. Pt impulsive and restless requiring Max cues for safety during session. However, she is highly motivated to participate in therapy and return to PLOF. Pt would benefit from further acute OT to facilitate safe dc. Recommend dc to CIR for intensive OT to optimize safety, independence with ADLs, and return to PLOF.      Follow Up Recommendations  CIR;Supervision/Assistance - 24 hour    Equipment Recommendations  Other (comment)(Defer to next venue)    Recommendations for Other Services Rehab consult;PT consult;Speech consult     Precautions / Restrictions Precautions Precautions: Fall;Other (comment) Precaution Comments: Poor awareness      Mobility Bed Mobility Overal bed mobility: Needs Assistance Bed Mobility: Supine to Sit     Supine to sit: Min assist;HOB elevated     General bed mobility comments: Min A for safety and trunk support. Pt impulsive and requiring Max cues for safety  Transfers Overall transfer level: Needs assistance Equipment used: Rolling walker (2 wheeled) Transfers: Sit to/from Stand Sit to Stand: Mod assist;Min assist         General transfer comment: Min A to power up  into standing. Mod A for posterior lean and to correct balance. Pt with decreased awareness of balance deficits    Balance Overall balance assessment: Needs assistance Sitting-balance support: No upper extremity supported;Feet supported;Single extremity supported Sitting balance-Leahy Scale: Fair Sitting balance - Comments: Able to sit at EOb with close Min Guard A for safety   Standing balance support: During functional activity;Bilateral upper extremity supported Standing balance-Leahy Scale: Poor Standing balance comment: Reliant on UE support and physical A due to posterior lean                           ADL either performed or assessed with clinical judgement   ADL Overall ADL's : Needs assistance/impaired Eating/Feeding: NPO   Grooming: Minimal assistance;Oral care;Sitting Grooming Details (indicate cue type and reason): Pt requiring assistance opening tooth brush container due to poor grasp strength and FM skills. Pt able to brush her teeth with increased time. When spitting into spit cup, pt presenting with poor problem solving and just spit on herself without grabbing cup Upper Body Bathing: Minimal assistance;Sitting   Lower Body Bathing: Moderate assistance;Sit to/from stand   Upper Body Dressing : Minimal assistance;Sitting   Lower Body Dressing: Moderate assistance;Sit to/from stand   Toilet Transfer: Moderate assistance;Ambulation;RW(Simulated to recliner)           Functional mobility during ADLs: Moderate assistance;Rolling walker General ADL Comments: Pt presenting with decreased strength, balance, coorindation, cognition, and safety. Pt highly motivated to participate in therapy     Vision Baseline Vision/History: Wears glasses(Contacts) Wears Glasses: At all times;Reading only Patient Visual Report: No change from  baseline Vision Assessment?: Yes Eye Alignment: Within Functional Limits Ocular Range of Motion: Within Functional  Limits Alignment/Gaze Preference: Within Defined Limits Tracking/Visual Pursuits: Able to track stimulus in all quads without difficulty Convergence: Within functional limits Visual Fields: No apparent deficits Diplopia Assessment: (Denies diplopia) Depth Perception: Undershoots Additional Comments: Pt tracking and presenting WFL and will continue to follow     Perception     Praxis      Pertinent Vitals/Pain Pain Assessment: No/denies pain Faces Pain Scale: No hurt Pain Intervention(s): Monitored during session     Hand Dominance Right   Extremity/Trunk Assessment Upper Extremity Assessment Upper Extremity Assessment: Generalized weakness;RUE deficits/detail;LUE deficits/detail(Poor coorindation) RUE Deficits / Details: Weak grasp and gross strength. Poor coorindation and difficulty opening objects. Undershooting with reach during finger-to-nose and ADLs. RUE Coordination: decreased gross motor;decreased fine motor LUE Deficits / Details: Weak grasp and gross strength. Poor coorindation and difficulty opening objects. Undershooting with reach during finger-to-nose and ADLs. LUE Coordination: decreased gross motor;decreased fine motor   Lower Extremity Assessment Lower Extremity Assessment: Defer to PT evaluation       Communication Communication Communication: No difficulties   Cognition Arousal/Alertness: Awake/alert Behavior During Therapy: Restless;Impulsive Overall Cognitive Status: Impaired/Different from baseline Area of Impairment: Attention;Memory;Following commands;Safety/judgement;Awareness;Orientation;Problem solving                 Orientation Level: Disoriented to;Time(stating it is July) Current Attention Level: Sustained Memory: Decreased short-term memory(recalling 2/3 ST memory words) Following Commands: Follows one step commands inconsistently;Follows one step commands with increased time Safety/Judgement: Decreased awareness of safety;Decreased  awareness of deficits Awareness: Emergent Problem Solving: Slow processing;Difficulty sequencing;Requires verbal cues General Comments: Poor safety and presents with impulsivity and restlessness. Pt with decreased awarness of deficits. After performing functional mobility in room where pt requires Mod A for balance, asked pt if she is safe to get OOB alone and she said yes "I think I can handle it all on my own." Further discussion and pt continued to say she thinks she can do it but agreed to wait for RN's help. Pt recalling 2/3 ST memory works. decreased attention requiring increased cues.   General Comments  VSS throughout    Exercises     Shoulder Instructions      Home Living Family/patient expects to be discharged to:: Private residence Living Arrangements: Alone Available Help at Discharge: Family;Available 24 hours/day Type of Home: Apartment(First) Home Access: Level entry           Bathroom Shower/Tub: Teacher, early years/pre: Standard     Home Equipment: None   Additional Comments: with her dog      Prior Functioning/Environment Level of Independence: Independent        Comments: Worked at a factory in [employer]        OT Problem List: Decreased strength;Decreased range of motion;Decreased activity tolerance;Impaired balance (sitting and/or standing);Decreased cognition;Decreased safety awareness;Decreased knowledge of use of DME or AE;Decreased coordination;Decreased knowledge of precautions;Impaired UE functional use      OT Treatment/Interventions: Self-care/ADL training;Therapeutic exercise;Energy conservation;DME and/or AE instruction;Therapeutic activities;Patient/family education;Cognitive remediation/compensation;Balance training    OT Goals(Current goals can be found in the care plan section) Acute Rehab OT Goals Patient Stated Goal: "Get back home" OT Goal Formulation: With patient Time For Goal Achievement: 05/26/18 Potential to  Achieve Goals: Good  OT Frequency: Min 3X/week   Barriers to D/C:            Co-evaluation  AM-PAC OT "6 Clicks" Daily Activity     Outcome Measure Help from another person eating meals?: Total Help from another person taking care of personal grooming?: A Little Help from another person toileting, which includes using toliet, bedpan, or urinal?: A Lot Help from another person bathing (including washing, rinsing, drying)?: A Lot Help from another person to put on and taking off regular upper body clothing?: A Little Help from another person to put on and taking off regular lower body clothing?: A Lot 6 Click Score: 13   End of Session Equipment Utilized During Treatment: Gait belt;Rolling walker Nurse Communication: Mobility status  Activity Tolerance: Patient tolerated treatment well Patient left: in chair;with call bell/phone within reach;with chair alarm set  OT Visit Diagnosis: Unsteadiness on feet (R26.81);Other abnormalities of gait and mobility (R26.89);Muscle weakness (generalized) (M62.81);Other symptoms and signs involving cognitive function                Time: 7533-9179 OT Time Calculation (min): 35 min Charges:  OT General Charges $OT Visit: 1 Visit OT Evaluation $OT Eval Moderate Complexity: 1 Mod OT Treatments $Self Care/Home Management : 8-22 mins  Oisin Yoakum MSOT, OTR/L Acute Rehab Pager: [phone] Office: [place] 05/12/2018, 9:38 AM

## 2018-05-13 DIAGNOSIS — R5381 Other malaise: Secondary | ICD-10-CM

## 2018-05-13 LAB — BASIC METABOLIC PANEL
Anion gap: 8 (ref 5–15)
BUN: 7 mg/dL (ref 6–20)
CO2: 23 mmol/L (ref 22–32)
Calcium: 7.7 mg/dL — ABNORMAL LOW (ref 8.9–10.3)
Chloride: 106 mmol/L (ref 98–111)
Creatinine, Ser: 0.64 mg/dL (ref 0.44–1.00)
GFR calc Af Amer: >60 mL/min (ref >60)
GFR calc non Af Amer: >60 mL/min (ref >60)
Glucose, Bld: 101 mg/dL — ABNORMAL HIGH (ref 70–99)
Potassium: 3.8 mmol/L (ref 3.5–5.1)
Sodium: 137 mmol/L (ref 135–145)

## 2018-05-13 LAB — BODY FLUID CULTURE
Culture: NO GROWTH
Special Requests: NORMAL

## 2018-05-13 LAB — GLUCOSE, CAPILLARY
Glucose-Capillary: 106 mg/dL — ABNORMAL HIGH (ref 70–99)
Glucose-Capillary: 119 mg/dL — ABNORMAL HIGH (ref 70–99)
Glucose-Capillary: 119 mg/dL — ABNORMAL HIGH (ref 70–99)
Glucose-Capillary: 147 mg/dL — ABNORMAL HIGH (ref 70–99)
Glucose-Capillary: 82 mg/dL (ref 70–99)
Glucose-Capillary: 88 mg/dL (ref 70–99)

## 2018-05-13 LAB — CBC
HCT: 22.9 % — ABNORMAL LOW (ref 36.0–46.0)
Hemoglobin: 7.2 g/dL — ABNORMAL LOW (ref 12.0–15.0)
MCH: 24.5 pg — ABNORMAL LOW (ref 26.0–34.0)
MCHC: 31.4 g/dL (ref 30.0–36.0)
MCV: 77.9 fL — ABNORMAL LOW (ref 80.0–100.0)
Platelets: 303 10*3/uL (ref 150–400)
RBC: 2.94 MIL/uL — ABNORMAL LOW (ref 3.87–5.11)
WBC: 8.4 10*3/uL (ref 4.0–10.5)
nRBC: 0.4 % — ABNORMAL HIGH (ref 0.0–0.2)

## 2018-05-13 LAB — AMMONIA: Ammonia: 29 umol/L (ref 9–35)

## 2018-05-13 MED ORDER — ENSURE ENLIVE PO LIQD
237.0000 mL | Freq: Two times a day (BID) | ORAL | Status: DC
  Administered 2018-05-14 – 2018-05-16 (×4): 237 mL via ORAL

## 2018-05-13 MED ORDER — CHLORHEXIDINE GLUCONATE CLOTH 2 % EX PADS
6.0000 | MEDICATED_PAD | Freq: Every day | CUTANEOUS | Status: DC
  Administered 2018-05-14 – 2018-05-16 (×3): 6 via TOPICAL

## 2018-05-13 MED ORDER — PANCRELIPASE (LIP-PROT-AMYL) 10440-39150 UNITS PO TABS
20880.0000 [IU] | ORAL_TABLET | Freq: Once | ORAL | Status: DC
  Filled 2018-05-13: qty 2

## 2018-05-13 MED ORDER — SODIUM BICARBONATE 650 MG PO TABS: 650.0000 mg | ORAL_TABLET | Freq: Once | ORAL | Status: DC

## 2018-05-13 NOTE — Progress Notes (Signed)
  Speech Language Pathology Treatment: Dysphagia  Patient Details Name: Jane Brooks MRN: [id number] DOB: April 14, 1970 Today's Date: 05/13/2018 Time: 0156-1537 SLP Time Calculation (min) (ACUTE ONLY): 8 min  Assessment / Plan / Recommendation Clinical Impression  Surgery requesting upgrade in diet texture if cleared from ST. Pt eager for solid po's. Vocal intensity is low which she states is her norm; reports odonophagia has resolved. Consumed 5-6 oz thin fluid and regular solid texture swiftly and effectively. No indications of airway compromise during session. Prolonged intubation increases risk however with clinical observation and review of lung sounds, temp appears stable. Will increase texture to regular, continue thin liquids, pills with thin. Will continue to work with pt while on acute care.    HPI HPI: 48 year old female admitted 04/25/2018 for Whipple procedure. Intubated 4/9-10 and 4/15-25. PMH: pancreatic cancer s/p chemo.      SLP Plan  Continue with current plan of care       Recommendations  Diet recommendations: Regular;Thin liquid Liquids provided via: Cup;Straw Medication Administration: Whole meds with liquid Supervision: Patient able to self feed;Intermittent supervision to cue for compensatory strategies Compensations: Slow rate;Small sips/bites Postural Changes and/or Swallow Maneuvers: Seated upright 90 degrees                Oral Care Recommendations: Oral care BID Follow up Recommendations: Inpatient Rehab SLP Visit Diagnosis: Dysphagia, unspecified (R13.10) Plan: Continue with current plan of care                       Houston Siren 05/13/2018, 10:16 AM  Orbie Pyo Colvin Caroli.Ed Risk analyst [phone] Office [phone]

## 2018-05-13 NOTE — Consult Note (Signed)
Physical Medicine and Rehabilitation Consult   Reason for Consult: Debility Referring Physician: Dr. Barry Dienes    HPI: Jane Brooks is a 48 y.o. 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 She was admitted on 04/25/18 for Whipple procedure with portal vein reconstruction, placement of pancreatic stent and jejunostomy feeding tube by Dr. Barry Dienes. Post op course complicated by hypotension with AKI, shocked liver,   tachycardia and shock due to SIRS. She tolerated extubation on 4/10 and AKI resolved with hydration. On 4/12, she started developing increase WOB and was started on cefepime for RUL HCAP. She required reintubation 4/15 due to persistent fevers, poor pain control and anxiety. Antibiotics broadened and she was started on enteral morphine. Thrombocytopenia treated with FFP and MRI abdomen showed right hepatic necrosis. CT head negative and hepatic encephalopathy resolving with addition of lactulose. She tolerated extubation to [place] on 4/25 and mentation improving and confusion resolving. She has been weaned off pressors and renal status back to baseline. Hospital course significant for anemia of chronic disease which is being monitored, electrolyte abnormalities, diarrhea, urinary retention, tachycardia, low grade fevers, hyperglycemia--at risk for diabetes and dysphagia. Tube feeds d/c due to diarrhea?and currently on full liquid diet. Therapy evaluations completed yesterday and CIR recommended due to functional decline. .    Review of Systems  Constitutional: Negative for chills and fever.  HENT: Negative for hearing loss and tinnitus.   Eyes: Negative for blurred vision and double vision.  Respiratory: Negative for cough, shortness of breath and wheezing.   Cardiovascular: Negative for chest pain and palpitations.  Gastrointestinal: Positive  for abdominal pain and diarrhea. Negative for nausea and vomiting.  Genitourinary: Negative for dysuria, frequency and urgency.  Musculoskeletal: Negative for back pain, myalgias and neck pain.  Skin: Negative for rash.  Neurological: Negative for dizziness, sensory change and headaches.  Psychiatric/Behavioral: Positive for memory loss. The patient does not have insomnia.       Past Medical History:  Diagnosis Date  . Family history of lung cancer   . Family history of prostate cancer   . Family history of uterine cancer   . Gallstones 10/2017  . Pancreatic cancer ([place])   . Pneumothorax, closed, traumatic    years ago  . PONV (postoperative nausea and vomiting)     Past Surgical History:  Procedure Laterality Date  . BILIARY STENT PLACEMENT  10/31/2017   Procedure: BILIARY STENT PLACEMENT;  Surgeon: [name], MD;  Location: [facility] ENDOSCOPY;  Service: Gastroenterology;;  . ERCP N/A 10/31/2017   Procedure: ENDOSCOPIC RETROGRADE CHOLANGIOPANCREATOGRAPHY (ERCP);  Surgeon: [name], MD;  Location: [facility] ENDOSCOPY;  Service: Gastroenterology;  Laterality: N/A;  . ESOPHAGOGASTRODUODENOSCOPY N/A 11/08/2017   Procedure: ESOPHAGOGASTRODUODENOSCOPY (EGD);  Surgeon: Milus Banister, MD;  Location: [facility] ENDOSCOPY;  Service: Endoscopy;  Laterality: N/A;  . EUS N/A 11/08/2017   Procedure: UPPER ENDOSCOPIC ULTRASOUND (EUS) RADIAL;  Surgeon: Milus Banister, MD;  Location: [facility] ENDOSCOPY;  Service: Endoscopy;  Laterality: N/A;  . FINE NEEDLE ASPIRATION  11/08/2017   Procedure: FINE NEEDLE ASPIRATION;  Surgeon: Milus Banister, MD;  Location: [facility] ENDOSCOPY;  Service: Endoscopy;;  . IR IMAGING GUIDED PORT INSERTION  11/15/2017  . LAPAROSCOPY N/A 04/25/2018   Procedure: LAPAROSCOPY DIAGNOSTIC;  Surgeon: Stark Klein, MD;  Location: [place];  Service: General;  Laterality: N/A;  GENERAL AND EPIDURAL ANESTHESIA  . SPHINCTEROTOMY  10/31/2017   Procedure: Joan Mayans;  Surgeon: [name], MD;   Location: [facility] ENDOSCOPY;  Service: Gastroenterology;;  . TONSILLECTOMY    . WHIPPLE PROCEDURE N/A 04/25/2018   Procedure: WHIPPLE PROCEDURE, RECONSTRUCTION OF PORTAL VEIN;  Surgeon: Stark Klein, MD;  Location: [place];  Service: General;  Laterality: N/A;    Family History  Problem Relation Age of Onset  . Diabetes Mother   . Hypertension Mother   . COPD Mother   . Uterine cancer Mother 76       had hysterectomy  . Diabetes Sister   . Hypertension Sister   . Lung cancer Maternal Grandmother        lung cancer  . Hypertension Sister     Social History:  Lives alone. Works for a [employer] from home. She reports that she has been smoking cigarettes--1 PPD. She has a 37.00 pack-year smoking history. She has never used smokeless tobacco. She reports previous alcohol use. She reports that she does not use drugs.   Allergies  Allergen Reactions  . Latex Hives   Medications Prior to Admission  Medication Sig Dispense Refill  . cyclobenzaprine (FLEXERIL) 5 MG tablet Take 1 tablet (5 mg total) by mouth 3 (three) times daily as needed for muscle spasms. 30 tablet 2  . lidocaine-prilocaine (EMLA) cream Apply 1 application topically as needed. 30 g 2  . morphine (MS CONTIN) 15 MG 12 hr tablet Take 1 tablet (15 mg total) by mouth every 12 (twelve) hours. 60 tablet 0  . morphine (MSIR) 15 MG tablet Take 1 tablet (15 mg total) by mouth every 6 (six) hours as needed for severe pain. 120 tablet 0  . ondansetron (ZOFRAN) 8 MG tablet Take 1 tablet (8 mg total) by mouth every 8 (eight) hours as needed for nausea or vomiting. 30 tablet 0  . prochlorperazine (COMPAZINE) 10 MG tablet Take 1 tablet (10 mg total) by mouth every 6 (six) hours as needed for nausea or vomiting. 30 tablet 3  . promethazine (PHENERGAN) 25 MG tablet Take 1 tablet (25 mg total) by mouth every 6 (six) hours as needed for nausea or vomiting. 30 tablet 0  . DULoxetine (CYMBALTA) 20 MG capsule Take 1 capsule (20 mg total)  by mouth 2 (two) times daily. Take once daily for 1 week then increase to BID (Patient not taking: Reported on 04/08/2018) 60 capsule 3  . loperamide (IMODIUM) 2 MG capsule Take 2 capsules (4 mg total) by mouth every 6 (six) hours as needed for diarrhea or loose stools. (Patient not taking: Reported on 04/08/2018) 60 capsule 0  . loratadine (CLARITIN) 10 MG tablet Take 1 tablet (10 mg total) by mouth daily. Take for 5 days after the Udenyca injection (Patient not taking: Reported on 04/08/2018) 30 tablet 2    Home: [place] expects to be discharged to:: Private residence Living Arrangements: Alone Available Help at Discharge: Family, Available 24 hours/day Type of Home: Apartment(First) Home Access: Level entry Bathroom Shower/Tub: Chiropodist: Standard Home Equipment: None Additional Comments: with her dog  Functional History: Prior Function Level of Independence: Independent Comments: Worked at a factory in [place]:  Mobility: Bed Mobility Overal bed mobility: Needs Assistance Bed Mobility: Supine to Sit Supine to sit: Min assist, HOB elevated General bed mobility comments: pt sitting up in chair upon PT arrival Transfers Overall transfer level: Needs assistance Equipment used: Rolling walker (2 wheeled) Transfers: Sit to/from Stand Sit to Stand: Min assist, +2 safety/equipment  General transfer comment: max directional verbal cues for hand placement and safety, minA to power up, pt given RW which instantly assisted with posterior lean bringing her forward Ambulation/Gait Ambulation/Gait assistance: Min assist, +2 safety/equipment Gait Distance (Feet): 150 Feet Assistive device: Rolling walker (2 wheeled) Gait Pattern/deviations: Step-through pattern, Decreased stride length, Narrow base of support General Gait Details: slow and guarded, minA for walker management, RN assisted with lines, pt then had an episode of stool  incontinence during ambulation, pt able to stand statically with bilat UEs on walker x 5 min for hygiene and changing of socks Gait velocity: dec Gait velocity interpretation: <1.31 ft/sec, indicative of household ambulator    ADL: ADL Overall ADL's : Needs assistance/impaired Eating/Feeding: NPO Grooming: Minimal assistance, Oral care, Sitting Grooming Details (indicate cue type and reason): Pt requiring assistance opening tooth brush container due to poor grasp strength and FM skills. Pt able to brush her teeth with increased time. When spitting into spit cup, pt presenting with poor problem solving and just spit on herself without grabbing cup Upper Body Bathing: Minimal assistance, Sitting Lower Body Bathing: Moderate assistance, Sit to/from stand Upper Body Dressing : Minimal assistance, Sitting Lower Body Dressing: Moderate assistance, Sit to/from stand Toilet Transfer: Moderate assistance, Ambulation, RW(Simulated to recliner) Functional mobility during ADLs: Moderate assistance, Rolling walker General ADL Comments: Pt presenting with decreased strength, balance, coorindation, cognition, and safety. Pt highly motivated to participate in therapy  Cognition: Cognition Overall Cognitive Status: Impaired/Different from baseline Orientation Level: Oriented to person, Oriented to place, Oriented to situation, Disoriented to time Cognition Arousal/Alertness: Awake/alert Behavior During Therapy: Impulsive Overall Cognitive Status: Impaired/Different from baseline Area of Impairment: Attention, Memory, Following commands, Safety/judgement, Awareness, Orientation, Problem solving Orientation Level: Disoriented to, Time, Situation(pt thinks she is in [place]) Current Attention Level: Sustained Memory: Decreased short-term memory Following Commands: Follows one step commands inconsistently, Follows one step commands with increased time Safety/Judgement: Decreased awareness of safety,  Decreased awareness of deficits Awareness: Emergent Problem Solving: Slow processing, Difficulty sequencing, Requires verbal cues General Comments: pt stating her sister lives right down the street and they live in [place], but then stated her sister lives in [place] and thats why she's here, then when re-oriented to [place], pt shocked but then stated she lived in [place] but doesn't know how she got here and her sister lives in [place]  Blood pressure 129/77, pulse 97, temperature 98.4 F (36.9 C), temperature source Oral, resp. rate (!) 30, height 4' 11"  (1.499 m), weight 61.7 kg, SpO2 98 %. Physical Exam  Nursing note and vitals reviewed. Constitutional: She is oriented to person, place, and time. She appears well-developed. No distress.  HENT:  Head: Normocephalic.  Eyes: Pupils are equal, round, and reactive to light.  Neck: Normal range of motion.  Cardiovascular: Normal rate.  Respiratory: Effort normal.  GI: She exhibits distension. Bowel sounds are decreased. There is abdominal tenderness.  Mid line incision C/D/I.   Musculoskeletal: Normal range of motion.  Neurological: She is alert and oriented to person, place, and time. No cranial nerve deficit. Coordination normal.  Dysphonia noted. Question mild disorientation. She was able to follow simple motor commands without difficulty. UE 4/5. LE 3+/5 HF, KE and 4/5 ADF/PF.   Skin: Skin is warm. She is not diaphoretic.  Psychiatric: She has a normal mood and affect. Her behavior is normal.    Results for orders placed or performed during the hospital encounter of 04/25/18 (from the past 24 hour(s))  Glucose,  capillary     Status: Abnormal   Collection Time: 05/12/18 11:39 AM  Result Value Ref Range   Glucose-Capillary 141 (H) 70 - 99 mg/dL   Comment 1 Notify RN    Comment 2 Document in Chart   Glucose, capillary     Status: Abnormal   Collection Time: 05/12/18  3:27 PM  Result Value Ref Range    Glucose-Capillary 152 (H) 70 - 99 mg/dL   Comment 1 Notify RN    Comment 2 Document in Chart   Glucose, capillary     Status: Abnormal   Collection Time: 05/12/18  7:25 PM  Result Value Ref Range   Glucose-Capillary 124 (H) 70 - 99 mg/dL  Glucose, capillary     Status: Abnormal   Collection Time: 05/12/18 11:30 PM  Result Value Ref Range   Glucose-Capillary 153 (H) 70 - 99 mg/dL  Glucose, capillary     Status: Abnormal   Collection Time: 05/13/18  3:11 AM  Result Value Ref Range   Glucose-Capillary 147 (H) 70 - 99 mg/dL  CBC     Status: Abnormal   Collection Time: 05/13/18  5:45 AM  Result Value Ref Range   WBC 8.4 4.0 - 10.5 K/uL   RBC 2.94 (L) 3.87 - 5.11 MIL/uL   Hemoglobin 7.2 (L) 12.0 - 15.0 g/dL   HCT 22.9 (L) 36.0 - 46.0 %   MCV 77.9 (L) 80.0 - 100.0 fL   MCH 24.5 (L) 26.0 - 34.0 pg   MCHC 31.4 30.0 - 36.0 g/dL   RDW Not Measured 11.5 - 15.5 %   Platelets 303 150 - 400 K/uL   nRBC 0.4 (H) 0.0 - 0.2 %  Basic metabolic panel     Status: Abnormal   Collection Time: 05/13/18  5:45 AM  Result Value Ref Range   Sodium 137 135 - 145 mmol/L   Potassium 3.8 3.5 - 5.1 mmol/L   Chloride 106 98 - 111 mmol/L   CO2 23 22 - 32 mmol/L   Glucose, Bld 101 (H) 70 - 99 mg/dL   BUN 7 6 - 20 mg/dL   Creatinine, Ser 0.64 0.44 - 1.00 mg/dL   Calcium 7.7 (L) 8.9 - 10.3 mg/dL   GFR calc non Af Amer >60 >60 mL/min   GFR calc Af Amer >60 >60 mL/min   Anion gap 8 5 - 15  Ammonia     Status: None   Collection Time: 05/13/18  5:45 AM  Result Value Ref Range   Ammonia 29 9 - 35 umol/L  Glucose, capillary     Status: None   Collection Time: 05/13/18  7:39 AM  Result Value Ref Range   Glucose-Capillary 88 70 - 99 mg/dL   Comment 1 Notify RN    Comment 2 Document in Chart    Dg Chest Port 1 View  Result Date: 05/12/2018 CLINICAL DATA:  Respiratory failure.  Pancreatic adenocarcinoma. EXAM: PORTABLE CHEST 1 VIEW COMPARISON:  05/11/2018. FINDINGS: Nasogastric tube has been removed.  Endotracheal tube has been removed. Unchanged and normal cardiomediastinal silhouette. Port-A-Cath stable, tip proximal RIGHT atrium. Healed LEFT-sided rib fractures. BILATERAL diffuse lung opacities continue to show mild improvement. LEFT basilar subsegmental atelectasis is less prominent. No effusions. IMPRESSION: Slight improvement aeration. Electronically Signed   By: Staci Righter M.D.   On: 05/12/2018 07:28     Assessment/Plan: Diagnosis: 48 yo female with debility after Whipple procedure for pancreatic cancer 1. Does the need for close, 24 hr/day medical supervision in  concert with the patient's rehab needs make it unreasonable for this patient to be served in a less intensive setting? Yes 2. Co-Morbidities requiring supervision/potential complications: wound care, thrombocytopenia, diarrhea, nutrition/dysphagia 3. Due to bladder management, bowel management, safety, skin/wound care, disease management, medication administration, pain management and patient education, does the patient require 24 hr/day rehab nursing? Yes 4. Does the patient require coordinated care of a physician, rehab nurse, PT (1-2 hrs/day, 5 days/week) and OT (1-2 hrs/day, 5 days/week) to address physical and functional deficits in the context of the above medical diagnosis(es)? Yes Addressing deficits in the following areas: balance, endurance, locomotion, strength, transferring, bowel/bladder control, bathing, dressing, feeding, grooming, toileting and psychosocial support 5. Can the patient actively participate in an intensive therapy program of at least 3 hrs of therapy per day at least 5 days per week? Yes 6. The potential for patient to make measurable gains while on inpatient rehab is excellent 7. Anticipated functional outcomes upon discharge from inpatient rehab are modified independent  with PT, modified independent with OT, n/a with SLP. 8. Estimated rehab length of stay to reach the above functional goals is:  7-10 days 9. Anticipated D/C setting: Home 10. Anticipated post D/C treatments: [place] therapy 11. Overall Rehab/Functional Prognosis: excellent  RECOMMENDATIONS: This patient's condition is appropriate for continued rehabilitative care in the following setting: CIR Patient has agreed to participate in recommended program. Yes Note that insurance prior authorization may be required for reimbursement for recommended care.  Comment: Pt lives alone. Extremely motivated. Rehab Admissions Coordinator to follow up.  Thanks,  Meredith Staggers, MD, Mellody Drown  I have personally performed a face to face diagnostic evaluation of this patient. Additionally, I have examined pertinent labs and radiographic images. I have reviewed and concur with the physician assistant's documentation above.    Bary Leriche, PA-C 05/13/2018

## 2018-05-13 NOTE — Progress Notes (Signed)
Nutrition Follow-up  RD working remotely.  DOCUMENTATION CODES:   Not applicable  INTERVENTION:   48 hr calorie count started today Will follow up to determine adequacy  Ensure Enlive po BID, each supplement provides 350 kcal and 20 grams of protein   Continue  Osmolite 1.5 @ 41 ml/hr via J-tube 30 ml Prostat BID  Tube feeding regimen provides 1676 kcal (106% of needs), 91 grams of protein, and 749 ml of H2O.   NUTRITION DIAGNOSIS:   Increased nutrient needs related to cancer and cancer related treatments as evidenced by estimated needs.  Ongoing  GOAL:   Patient will meet greater than or equal to 90% of their needs  Met.   MONITOR:   PO intake, Supplement acceptance  REASON FOR ASSESSMENT:   Consult Enteral/tube feeding initiation and management  ASSESSMENT:   48 year old female who presented on 4/09 for diagnostic laparoscopy, pancreaticoduodenectomy, portal vein reconstruction, placement of pancreatic stent, and placement of J-tube. PMH of adenoma of the pancreatic head and had been receiving chemotherapy.  4/9 s/p whipple 4/10 pt self extubated, NG tube replaced, j-tube placed 4/11 rectal tube removed 4/13 NGT d/c, advanced to clear liquids 4/14 TF advanced to goal 4/15 pt intubated early this am, noted pt with PNA 4/25 extubated  Per MD pt hates using J-tube for feedings and wants to eat. SLP has cleared for Regular diet Per RN pt does not want the TF anymore and is willing to eat.   Drain x 2 output:  1: 175 ml   Medications reviewed and include: creon TID, SSI, lactulose, 20 mEq KCl BID 200 ml every 8 hours = 600 ml  Labs reviewed CBGS: [id number](inpatient orders for glycemic control are 0-9 units insulin aspart every 4 hours).   Diet Order:   Diet Order            Diet regular Room service appropriate? Yes; Fluid consistency: Thin  Diet effective now              EDUCATION NEEDS:   Not appropriate for education at this  time  Skin:  Skin Assessment: Skin Integrity Issues: Skin Integrity Issues:: Incisions Incisions: closed abdomen  Last BM:  4/27 large, type 7 (on lactulose)  Height:   Ht Readings from Last 1 Encounters:  04/25/18 4' 11"  (1.499 m)    Weight:   Wt Readings from Last 1 Encounters:  05/13/18 61.7 kg    Ideal Body Weight:  43.2 kg  BMI:  Body mass index is 27.47 kg/m.  Estimated Nutritional Needs:   Kcal:  1600-1900  Protein:  75-100 grams   Fluid:  > 1.7 L/day   Maylon Peppers RD, LDN, CNSC [phone] Pager [phone] After Hours Pager

## 2018-05-13 NOTE — Progress Notes (Signed)
Patient ID: Jane Brooks, female   DOB: [birth year], 48 y.o.   MRN: [id number] icu beds needed, appears ok for transfer to 4np

## 2018-05-13 NOTE — Progress Notes (Signed)
18 Days Post-Op   Subjective/Chief Complaint: Hates feeding tube.  Had significant diarrhea yesterday.     Objective: Vital signs in last 24 hours: Temp:  [98.4 F (36.9 C)-100.2 F (37.9 C)] 98.4 F (36.9 C) (04/27 0800) Pulse Rate:  [92-132] 97 (04/27 0800) Resp:  [17-105] 30 (04/27 0800) BP: (101-157)/(53-90) 129/77 (04/27 0800) SpO2:  [95 %-100 %] 98 % (04/27 0829) FiO2 (%):  [30 %] 30 % (04/27 0400) Weight:  [61.7 kg] 61.7 kg (04/27 0500) Last BM Date: 05/12/18  Intake/Output from previous day: 04/26 0701 - 04/27 0700 In: 4068.2 [P.O.:720; I.V.:797.4; NG/GT:1745; IV Piggyback:505.8] Out: 704 [Urine:525; Drains:175; Stool:4] Intake/Output this shift: No intake/output data recorded.  General appearance: cooperative, alert.   Eyes: no icterus Resp: breathing comfortably Cardio: regular rate and rhythm GI: soft, incision healed, TF off, sl milky drain, but less so than before.   Extremities: calves soft  Lab Results:  Recent Labs    05/12/18 0409 05/13/18 0545  WBC 9.1 8.4  HGB 7.0* 7.2*  HCT 22.7* 22.9*  PLT 323 303   BMET Recent Labs    05/12/18 0409 05/13/18 0545  NA 142 137  K 3.3* 3.8  CL 111 106  CO2 23 23  GLUCOSE 123* 101*  BUN 8 7  CREATININE 0.65 0.64  CALCIUM 8.0* 7.7*   PT/INR Recent Labs    05/11/18 0542  LABPROT 16.6*  INR 1.4*   ABG No results for input(s): PHART, HCO3 in the last 72 hours.  Invalid input(s): PCO2, PO2  Studies/Results: Dg Chest Port 1 View  Result Date: 05/12/2018 CLINICAL DATA:  Respiratory failure.  Pancreatic adenocarcinoma. EXAM: PORTABLE CHEST 1 VIEW COMPARISON:  05/11/2018. FINDINGS: Nasogastric tube has been removed. Endotracheal tube has been removed. Unchanged and normal cardiomediastinal silhouette. Port-A-Cath stable, tip proximal RIGHT atrium. Healed LEFT-sided rib fractures. BILATERAL diffuse lung opacities continue to show mild improvement. LEFT basilar subsegmental atelectasis is less prominent.  No effusions. IMPRESSION: Slight improvement aeration. Electronically Signed   By: Staci Righter M.D.   On: 05/12/2018 07:28    Anti-infectives: Anti-infectives (From admission, onward)   Start     Dose/Rate Route Frequency Ordered Stop   05/04/18 2100  vancomycin (VANCOCIN) IVPB 750 mg/150 ml premix     750 mg 150 mL/hr over 60 Minutes Intravenous Every 12 hours 05/04/18 2027     05/03/18 1000  fluconazole (DIFLUCAN) IVPB 400 mg  Status:  Discontinued     400 mg 100 mL/hr over 120 Minutes Intravenous Every 24 hours 05/02/18 0721 05/02/18 0855   05/02/18 0800  vancomycin (VANCOCIN) 500 mg in sodium chloride 0.9 % 100 mL IVPB  Status:  Discontinued     500 mg 100 mL/hr over 60 Minutes Intravenous Every 12 hours 05/02/18 0721 05/04/18 2027   05/02/18 0800  piperacillin-tazobactam (ZOSYN) IVPB 3.375 g     3.375 g 12.5 mL/hr over 240 Minutes Intravenous Every 8 hours 05/02/18 0721     05/02/18 0730  fluconazole (DIFLUCAN) IVPB 800 mg     800 mg 200 mL/hr over 120 Minutes Intravenous  Once 05/02/18 0721 05/02/18 0952   04/30/18 2230  vancomycin (VANCOCIN) IVPB 750 mg/150 ml premix  Status:  Discontinued     750 mg 150 mL/hr over 60 Minutes Intravenous Every 48 hours 04/28/18 2133 04/29/18 1121   04/30/18 2230  vancomycin (VANCOCIN) IVPB 1000 mg/200 mL premix  Status:  Discontinued     1,000 mg 200 mL/hr over 60 Minutes Intravenous Every 48 hours  04/29/18 1121 04/30/18 0904   04/30/18 1000  vancomycin (VANCOCIN) IVPB 1000 mg/200 mL premix     1,000 mg 200 mL/hr over 60 Minutes Intravenous Every 24 hours 04/30/18 0904 05/01/18 1011   04/30/18 0915  ceFEPIme (MAXIPIME) 2 g in sodium chloride 0.9 % 100 mL IVPB  Status:  Discontinued     2 g 200 mL/hr over 30 Minutes Intravenous Every 12 hours 04/30/18 0905 05/02/18 0721   04/28/18 2130  ceFEPIme (MAXIPIME) 2 g in sodium chloride 0.9 % 100 mL IVPB  Status:  Discontinued     2 g 200 mL/hr over 30 Minutes Intravenous Every 24 hours 04/28/18  2113 04/30/18 0905   04/28/18 2130  vancomycin (VANCOCIN) 1,250 mg in sodium chloride 0.9 % 250 mL IVPB     1,250 mg 166.7 mL/hr over 90 Minutes Intravenous  Once 04/28/18 2129 04/29/18 0107   04/25/18 1530  ceFAZolin (ANCEF) IVPB 2g/100 mL premix     2 g 200 mL/hr over 30 Minutes Intravenous Every 8 hours 04/25/18 1520 04/25/18 1739   04/25/18 0600  ceFAZolin (ANCEF) IVPB 2g/100 mL premix     2 g 200 mL/hr over 30 Minutes Intravenous On call to O.R. 04/25/18 0545 04/25/18 1149      Assessment/Plan: POD 18 Whipple with portal vein reconstruction for adenoca of panc head.  1 LN positive and vein margin positive.      Neuro- improved.  Continue enteral morphine.  Speech for cognition.     Pulm/ID- reintubated for respiratory distress and increased WOB.  Zosyn/vanc should cover respiratory or GI pathogens.   Drain lipase very low, but drain #2 now milky.  Fever curve decreased; likely secondary to liver necrosis and pneumonia. Continue coverage for pneumonia and biliary pathogens which include gram negs, anerobes, MRSA and enterococcus.  Anticipate 2 week course of antibiotics.  Should stop 4/30.    CV- tachycardia stable.  Pt young.  Appears reactive in nature. Staying off pressors.    GI-   LFTs back to normal.  Still infarction pattern on scan.  No sign of abscess. Previous liver duplex showed patent portal and hepatic veins.  Tube feeds were at goal. One drain now appearing cloudy.     Renal- a bit oliguric yesterday  FEN: hypokalemia present again.  Add enteral KCl.  Hypernatremia - mild, stable.  Advance diet as tolerated.  May have delayed gastric emptying, watch for aspiration.  Speech working with.  Pt wants to try to get calories in PO.  Will do calorie count.    Endocrine- hyperglycemia, continue ssi, glucose under reasonable control. At risk of developing diabetes given amount of pancreas removed.  Will see how blood sugars settle out once acute stress improved.     Heme:  ABL anemia on anemia of chronic dx, anemia of chemotherapy.  stable  VTE ppx- SCDs, lovenox ppx  Plan: continue antibiotics and lactulose, pt/ot/st/rehab consult   LOS: 18 days    Stark Klein 05/13/2018

## 2018-05-14 LAB — GLUCOSE, CAPILLARY
Glucose-Capillary: 86 mg/dL (ref 70–99)
Glucose-Capillary: 90 mg/dL (ref 70–99)
Glucose-Capillary: 98 mg/dL (ref 70–99)
Glucose-Capillary: 99 mg/dL (ref 70–99)
Glucose-Capillary: 90 mg/dL (ref 70–99)

## 2018-05-14 MED ORDER — SENNOSIDES-DOCUSATE SODIUM 8.6-50 MG PO TABS: 1.0000 | ORAL_TABLET | Freq: Two times a day (BID) | ORAL | Status: DC | PRN

## 2018-05-14 MED ORDER — BOOST / RESOURCE BREEZE PO LIQD CUSTOM
1.0000 | Freq: Two times a day (BID) | ORAL | Status: DC
  Administered 2018-05-15 – 2018-05-16 (×3): 1 via ORAL

## 2018-05-14 MED ORDER — PANTOPRAZOLE SODIUM 40 MG PO TBEC
40.0000 mg | DELAYED_RELEASE_TABLET | Freq: Every day | ORAL | Status: DC
  Administered 2018-05-15 – 2018-05-16 (×2): 40 mg via ORAL
  Filled 2018-05-14 (×2): qty 1

## 2018-05-14 MED ORDER — POTASSIUM CHLORIDE CRYS ER 20 MEQ PO TBCR
20.0000 meq | EXTENDED_RELEASE_TABLET | Freq: Two times a day (BID) | ORAL | Status: DC
  Administered 2018-05-14 – 2018-05-16 (×4): 20 meq via ORAL
  Filled 2018-05-14 (×4): qty 1

## 2018-05-14 MED ORDER — MORPHINE SULFATE 15 MG PO TABS
15.0000 mg | ORAL_TABLET | ORAL | Status: DC
  Administered 2018-05-14 – 2018-05-16 (×14): 15 mg via ORAL
  Filled 2018-05-14 (×14): qty 1

## 2018-05-14 MED ORDER — BETHANECHOL CHLORIDE 10 MG PO TABS
10.0000 mg | ORAL_TABLET | Freq: Three times a day (TID) | ORAL | Status: DC
  Administered 2018-05-14 – 2018-05-16 (×5): 10 mg via ORAL
  Filled 2018-05-14 (×5): qty 1

## 2018-05-14 MED ORDER — LACTULOSE 10 GM/15ML PO SOLN
20.0000 g | Freq: Every day | ORAL | Status: DC
  Administered 2018-05-15: 20 g
  Filled 2018-05-14 (×2): qty 30

## 2018-05-14 MED ORDER — MORPHINE SULFATE (PF) 4 MG/ML IV SOLN
4.0000 mg | INTRAVENOUS | Status: DC | PRN
  Administered 2018-05-14 – 2018-05-16 (×4): 4 mg via INTRAVENOUS
  Filled 2018-05-14 (×4): qty 1

## 2018-05-14 NOTE — Progress Notes (Signed)
Sent Dr. Barry Dienes text page to advise that patient is having N/V.  N/V now improved after Zofran administered.

## 2018-05-14 NOTE — Progress Notes (Signed)
Inpatient Rehabilitation Admissions Coordinator  Inpatient Rehab Consult received. I met with patient at the bedside for rehabilitation assessment. We discussed goals and expectations of an inpatient rehab admission.  She prefers an inpt rehab admit prior to d/c home. I clarified that her admission to CIR would depend on bed availability when she is medically ready for d/c. I will follow up tomorrow.  Danne Baxter, RN, MSN Rehab Admissions Coordinator [phone] 05/14/2018 11:00 AM

## 2018-05-14 NOTE — Progress Notes (Signed)
19 Days Post-Op   Subjective/Chief Complaint: Ate reasonable well yesterday.     Objective: Vital signs in last 24 hours: Temp:  [98.2 F (36.8 C)-99.9 F (37.7 C)] 98.2 F (36.8 C) (04/28 1603) Pulse Rate:  [95-138] 104 (04/28 1603) Resp:  [20-37] 20 (04/28 1603) BP: (123-141)/(73-85) 136/84 (04/28 1603) SpO2:  [94 %-100 %] 98 % (04/28 1603) Weight:  [66.2 kg] 66.2 kg (04/28 0500) Last BM Date: 05/14/18  Intake/Output from previous day: 04/27 0701 - 04/28 0700 In: 1296.1 [P.O.:1060; I.V.:56.2; IV Piggyback:179.9] Out: 1017 [Urine:925; Drains:90; Stool:2] Intake/Output this shift: Total I/O In: 305.6 [P.O.:240; IV Piggyback:65.6] Out: 100 [Drains:100]  General appearance: cooperative, alert.   Eyes: no icterus Resp: breathing comfortably Cardio: regular rate and rhythm GI: soft, incision healed, TF off, sl milky drain, but less so than before.   Extremities: calves soft  Lab Results:  Recent Labs    05/12/18 0409 05/13/18 0545  WBC 9.1 8.4  HGB 7.0* 7.2*  HCT 22.7* 22.9*  PLT 323 303   BMET Recent Labs    05/12/18 0409 05/13/18 0545  NA 142 137  K 3.3* 3.8  CL 111 106  CO2 23 23  GLUCOSE 123* 101*  BUN 8 7  CREATININE 0.65 0.64  CALCIUM 8.0* 7.7*   PT/INR No results for input(s): LABPROT, INR in the last 72 hours. ABG No results for input(s): PHART, HCO3 in the last 72 hours.  Invalid input(s): PCO2, PO2  Studies/Results: No results found.  Anti-infectives: Anti-infectives (From admission, onward)   Start     Dose/Rate Route Frequency Ordered Stop   05/04/18 2100  vancomycin (VANCOCIN) IVPB 750 mg/150 ml premix     750 mg 150 mL/hr over 60 Minutes Intravenous Every 12 hours 05/04/18 2027 05/16/18 2359   05/03/18 1000  fluconazole (DIFLUCAN) IVPB 400 mg  Status:  Discontinued     400 mg 100 mL/hr over 120 Minutes Intravenous Every 24 hours 05/02/18 0721 05/02/18 0855   05/02/18 0800  vancomycin (VANCOCIN) 500 mg in sodium chloride 0.9 % 100  mL IVPB  Status:  Discontinued     500 mg 100 mL/hr over 60 Minutes Intravenous Every 12 hours 05/02/18 0721 05/04/18 2027   05/02/18 0800  piperacillin-tazobactam (ZOSYN) IVPB 3.375 g     3.375 g 12.5 mL/hr over 240 Minutes Intravenous Every 8 hours 05/02/18 0721 05/16/18 2359   05/02/18 0730  fluconazole (DIFLUCAN) IVPB 800 mg     800 mg 200 mL/hr over 120 Minutes Intravenous  Once 05/02/18 0721 05/02/18 0952   04/30/18 2230  vancomycin (VANCOCIN) IVPB 750 mg/150 ml premix  Status:  Discontinued     750 mg 150 mL/hr over 60 Minutes Intravenous Every 48 hours 04/28/18 2133 04/29/18 1121   04/30/18 2230  vancomycin (VANCOCIN) IVPB 1000 mg/200 mL premix  Status:  Discontinued     1,000 mg 200 mL/hr over 60 Minutes Intravenous Every 48 hours 04/29/18 1121 04/30/18 0904   04/30/18 1000  vancomycin (VANCOCIN) IVPB 1000 mg/200 mL premix     1,000 mg 200 mL/hr over 60 Minutes Intravenous Every 24 hours 04/30/18 0904 05/01/18 1011   04/30/18 0915  ceFEPIme (MAXIPIME) 2 g in sodium chloride 0.9 % 100 mL IVPB  Status:  Discontinued     2 g 200 mL/hr over 30 Minutes Intravenous Every 12 hours 04/30/18 0905 05/02/18 0721   04/28/18 2130  ceFEPIme (MAXIPIME) 2 g in sodium chloride 0.9 % 100 mL IVPB  Status:  Discontinued  2 g 200 mL/hr over 30 Minutes Intravenous Every 24 hours 04/28/18 2113 04/30/18 0905   04/28/18 2130  vancomycin (VANCOCIN) 1,250 mg in sodium chloride 0.9 % 250 mL IVPB     1,250 mg 166.7 mL/hr over 90 Minutes Intravenous  Once 04/28/18 2129 04/29/18 0107   04/25/18 1530  ceFAZolin (ANCEF) IVPB 2g/100 mL premix     2 g 200 mL/hr over 30 Minutes Intravenous Every 8 hours 04/25/18 1520 04/25/18 1739   04/25/18 0600  ceFAZolin (ANCEF) IVPB 2g/100 mL premix     2 g 200 mL/hr over 30 Minutes Intravenous On call to O.R. 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  1 LN positive and vein margin  positive.      Neuro- improved.  Continue enteral morphine.  Speech for cognition.     Pulm/ID- reintubated for respiratory distress and increased WOB.  Zosyn/vanc should cover respiratory or GI pathogens.   Drain lipase very low, but drain #2 now milky.  Fever curve decreased; likely secondary to liver necrosis and pneumonia. Continue coverage for pneumonia and biliary pathogens which include gram negs, anerobes, MRSA and enterococcus.  Anticipate 2 week course of antibiotics.  Should stop 4/30.    CV- tachycardia stable.  Pt young.  Appears reactive in nature. Staying off pressors.    GI-   LFTs back to normal.  Still infarction pattern on scan.  No sign of abscess. Previous liver duplex showed patent portal and hepatic veins.  Tube feeds were at goal prior to our attempt to do only oral intake.   One drain now appearing cloudy.     Renal- a bit oliguric yesterday  FEN: hypokalemia present again.  Add enteral KCl.  Hypernatremia - mild, stable.  Advance diet as tolerated.  May have delayed gastric emptying, watch for aspiration.  Speech working with.  Pt wants to try to get calories in PO.  Will do calorie count.    Endocrine- hyperglycemia, continue ssi, glucose under reasonable control. At risk of developing diabetes given amount of pancreas removed.  Will see how blood sugars settle out once acute stress improved.    Heme:  ABL anemia on anemia of chronic dx, anemia of chemotherapy.  stable  VTE ppx- SCDs, lovenox ppx  Plan: rehab later this week hopefully once PO intake settles out.     LOS: 19 days    Stark Klein 05/14/2018

## 2018-05-14 NOTE — Progress Notes (Signed)
Physical Therapy Treatment Patient Details Name: Jane Brooks MRN: [id number] DOB: October 03, 1970 Today's Date: 05/14/2018    History of Present Illness 48 y/o female with pancreatic adenocarcinoma s/p chemotherapy admitted for planned whipple on 4/9. PCCM consulted for post-op hypotension, AKI and shock liver. Self-extubated on 4/10 however developed respiratory distress secondary to HCAP and was reintubated on 4/15 and extubated on 4/25.    PT Comments    Pt with improved transfer and ambulation ability and tolerance. Pt cont to demonstrate delayed processing, decreased safety awareness, and impaired memory. Acute PT to cont to follow.   Follow Up Recommendations  CIR     Equipment Recommendations  Rolling walker with 5" wheels    Recommendations for Other Services Rehab consult     Precautions / Restrictions Precautions Precautions: Fall;Other (comment) Precaution Comments: pt with freq episodes of loose stool Restrictions Weight Bearing Restrictions: No    Mobility  Bed Mobility Overal bed mobility: Needs Assistance Bed Mobility: Supine to Sit     Supine to sit: Min assist;HOB elevated     General bed mobility comments: pt sitting up in chair  Transfers Overall transfer level: Needs assistance Equipment used: Rolling walker (2 wheeled) Transfers: Sit to/from Stand Sit to Stand: Min guard         General transfer comment: Min Guard A for safety and pt requiring cues for hand placement  Ambulation/Gait Ambulation/Gait assistance: Herbalist (Feet): 200 Feet Assistive device: Rolling walker (2 wheeled) Gait Pattern/deviations: Step-through pattern;Decreased stride length;Narrow base of support Gait velocity: dec Gait velocity interpretation: <1.31 ft/sec, indicative of household ambulator General Gait Details: slow and guarded, minA for walker management, RN assisted with lines, pt then had an episode of stool incontinence during ambulation, pt  able to stand statically with bilat UEs on walker x 5 min for hygiene and changing of socks   Stairs             Wheelchair Mobility    Modified Rankin (Stroke Patients Only)       Balance Overall balance assessment: Needs assistance Sitting-balance support: No upper extremity supported;Feet supported;Single extremity supported Sitting balance-Leahy Scale: Good Sitting balance - Comments: Able to sit at EOb with close [employer] A for safety   Standing balance support: During functional activity;Bilateral upper extremity supported Standing balance-Leahy Scale: Fair Standing balance comment: assisted pt to the bathroom, pt used wash cloth to perform pericare, required unilateral UE support on RW, pt able to wash hands at sink without leaning up on counter                            Cognition Arousal/Alertness: Awake/alert Behavior During Therapy: WFL for tasks assessed/performed Overall Cognitive Status: Impaired/Different from baseline Area of Impairment: Problem solving;Awareness                   Current Attention Level: Selective   Following Commands: Follows one step commands inconsistently;Follows one step commands with increased time Safety/Judgement: Decreased awareness of safety;Decreased awareness of deficits Awareness: Emergent Problem Solving: Slow processing;Difficulty sequencing;Requires verbal cues General Comments: pt with improved memory however pt with stool incontinence and said she called x 2 but didn't see the light on      Exercises      General Comments General comments (skin integrity, edema, etc.): vss      Pertinent Vitals/Pain Pain Assessment: 0-10 Pain Score: 7  Faces Pain Scale: Hurts a little bit Pain  Location: Stomach Pain Descriptors / Indicators: Discomfort;Grimacing Pain Intervention(s): Monitored during session    Home Living                      Prior Function            PT Goals (current  goals can now be found in the care plan section) Acute Rehab PT Goals Patient Stated Goal: "Get back home" Progress towards PT goals: Progressing toward goals    Frequency    Min 3X/week      PT Plan Current plan remains appropriate    Co-evaluation              AM-PAC PT "6 Clicks" Mobility   Outcome Measure  Help needed turning from your back to your side while in a flat bed without using bedrails?: A Little Help needed moving from lying on your back to sitting on the side of a flat bed without using bedrails?: A Little Help needed moving to and from a bed to a chair (including a wheelchair)?: A Little Help needed standing up from a chair using your arms (e.g., wheelchair or bedside chair)?: A Little Help needed to walk in hospital room?: A Lot Help needed climbing 3-5 steps with a railing? : A Lot 6 Click Score: 16    End of Session Equipment Utilized During Treatment: Gait belt Activity Tolerance: Patient tolerated treatment well Patient left: in chair;with call bell/phone within reach;with nursing/sitter in room;with chair alarm set Nurse Communication: Mobility status PT Visit Diagnosis: Unsteadiness on feet (R26.81);Muscle weakness (generalized) (M62.81);Difficulty in walking, not elsewhere classified (R26.2)     Time: 1000-1023 PT Time Calculation (min) (ACUTE ONLY): 23 min  Charges:  $Gait Training: 8-22 mins $Therapeutic Activity: 8-22 mins                     Kittie Plater, PT, DPT Acute Rehabilitation Services Pager #: [phone] Office #: [phone]    Berline Lopes 05/14/2018, 11:39 AM

## 2018-05-14 NOTE — Progress Notes (Signed)
  Speech Language Pathology Treatment: Dysphagia  Patient Details Name: Jane Brooks MRN: [id number] DOB: 03-13-1970 Today's Date: 05/14/2018 Time: 7639-4320 SLP Time Calculation (min) (ACUTE ONLY): 10 min  Assessment / Plan / Recommendation Clinical Impression  Pt's oropharyngeal swallow function from subjective view appeared normal. She states vocal quality is always low but suspect 11-12 day intubation had affects. No signs aspiration. Continue regular texture, thin. Plans for possible CIR say. Recommend cognitive assessment on CIR as she has had executive impairment challenges during therapy with OT. Will discharge from [place] on acute care.    HPI HPI: 48 year old female admitted 04/25/2018 for Whipple procedure. Intubated 4/9-10 and 4/15-25. PMH: pancreatic cancer s/p chemo.      SLP Plan  All goals met       Recommendations  Diet recommendations: Regular;Thin liquid Liquids provided via: Cup;Straw Medication Administration: Whole meds with liquid Supervision: Patient able to self feed;Intermittent supervision to cue for compensatory strategies Compensations: Slow rate;Small sips/bites Postural Changes and/or Swallow Maneuvers: Seated upright 90 degrees                Oral Care Recommendations: Oral care BID Follow up Recommendations: Inpatient Rehab SLP Visit Diagnosis: Dysphagia, unspecified (R13.10) Plan: All goals met       GO                Houston Siren 05/14/2018, 10:39 AM  Orbie Pyo Colvin Caroli.Ed Risk analyst [phone] Office [phone]

## 2018-05-14 NOTE — Progress Notes (Signed)
Nutrition Follow-up   RD working remotely.  DOCUMENTATION CODES:   Not applicable  INTERVENTION:  Calorie Count ongoing. RD to follow up tomorrow with results.  Continue Ensure Enlive po BID, each supplement provides 350 kcal and 20 grams of protein.  Provide Boost Breeze po BID, each supplement provides 250 kcal and 9 grams of protein.  If pt unable to tolerate PO with frequent n/v, may need to consider re-initiation of enteral feedings via J-tube.  NUTRITION DIAGNOSIS:   Increased nutrient needs related to cancer and cancer related treatments as evidenced by estimated needs; ongoing  GOAL:   Patient will meet greater than or equal to 90% of their needs; progressing  MONITOR:   PO intake, Supplement acceptance  REASON FOR ASSESSMENT:   Consult Enteral/tube feeding initiation and management  ASSESSMENT:   48 year old female who presented on 4/09 for diagnostic laparoscopy, pancreaticoduodenectomy, portal vein reconstruction, placement of pancreatic stent, and placement of J-tube. PMH of adenoma of the pancreatic head and had been receiving chemotherapy.  4/9 s/p whipple 4/10 pt self extubated, NG tube replaced, j-tube placed 4/11 rectal tube removed 4/13 NGT d/c, advanced to clear liquids 4/14 TF advanced to goal 4/15 pt intubated early this am, noted pt with PNA 4/25 extubated  Per RN, pt with large bout of emesis last night and this AM after breakfast. Pt has been consuming Ensure, however noted emesis after Ensure consumption. Pt continues to be willing to eat. RD to order Boost Breeze to aid in PO intake. Discussed with RN to document percentage of food items consumed at meals under po intake in flowsheets in [facility]. RD to follow up tomorrow with calorie count results. Tube feeds have been on hold for calorie count/PO trial. If pt continues to have n/v after meal consumption, may need to consider re-initiation of enteral feedings via J-tube to provide adequate  nutrition.   Labs and medications reviewed.   Diet Order:   Diet Order            Diet regular Room service appropriate? Yes; Fluid consistency: Thin  Diet effective now              EDUCATION NEEDS:   Not appropriate for education at this time  Skin:  Skin Assessment: Skin Integrity Issues: Skin Integrity Issues:: Incisions Incisions: abdomen  Last BM:  4/28   Height:   Ht Readings from Last 1 Encounters:  04/25/18 4' 11"  (1.499 m)    Weight:   Wt Readings from Last 1 Encounters:  05/14/18 66.2 kg    Ideal Body Weight:  43.2 kg  BMI:  Body mass index is 29.48 kg/m.  Estimated Nutritional Needs:   Kcal:  1600-1900  Protein:  75-100 grams   Fluid:  > 1.7 L/day   Corrin Parker, MS, RD, LDN Pager # [phone] After hours/ weekend pager # [phone]

## 2018-05-14 NOTE — Progress Notes (Addendum)
Occupational Therapy Treatment Patient Details Name: Jane Brooks MRN: [id number] DOB: [birth year] Today's Date: 05/14/2018    History of present illness 48 y/o female with pancreatic adenocarcinoma s/p chemotherapy admitted for planned whipple on 4/9. PCCM consulted for post-op hypotension, AKI and shock liver. Self-extubated on 4/10 however developed respiratory distress secondary to HCAP and was reintubated on 4/15 and extubated on 4/25.   OT comments  Pt progressing towards established OT goals and is highly motivated to participate in therapy despite pain (7/10) and fatigue. Pt performing grooming at sink with Min guard A. Pt demonstrating increased awareness, safety, and orientation this session. Pt performing functional mobility in hallway with Min Guard A for safety; noting decreased activity tolerance as pt HR elevated to 126 and RR to 41. Pt continues to present with poor problem solving, attention, and executive functioning as seen during simple money management task. Continue to recommend dc to CIR to increased independence and reach Mod I level before dc home. Will continue to follow acutely as admitted to facilitate safe dc.    Follow Up Recommendations  CIR;Supervision/Assistance - 24 hour    Equipment Recommendations  Other (comment)(Defer to next venue)    Recommendations for Other Services Rehab consult;PT consult;Speech consult    Precautions / Restrictions Precautions Precautions: Fall;Other (comment) Precaution Comments: Poor awareness Restrictions Weight Bearing Restrictions: No       Mobility Bed Mobility Overal bed mobility: Needs Assistance Bed Mobility: Supine to Sit     Supine to sit: Min assist;HOB elevated     General bed mobility comments: pt sitting up in chair upon PT arrival  Transfers Overall transfer level: Needs assistance Equipment used: Rolling walker (2 wheeled) Transfers: Sit to/from Stand Sit to Stand: Min guard          General transfer comment: Min Guard A for safety and pt requiring cues for hand placement    Balance Overall balance assessment: Needs assistance Sitting-balance support: No upper extremity supported;Feet supported;Single extremity supported Sitting balance-Leahy Scale: Fair Sitting balance - Comments: Able to sit at EOb with close Min Guard A for safety   Standing balance support: During functional activity;Bilateral upper extremity supported Standing balance-Leahy Scale: Fair Standing balance comment: performing grooming at sink                           ADL either performed or assessed with clinical judgement   ADL Overall ADL's : Needs assistance/impaired     Grooming: Oral care;Wash/dry hands;Min guard;Standing Grooming Details (indicate cue type and reason): Min Guard A for safety as pt performed oral care, hand hygiene, and putting in her contact. Pt presenting with increased grasp and pinch strength to manage grooming items.                  Toilet Transfer: Min guard;Ambulation;RW(Simulated to recliner) [employer] forces technical officer Details (indicate cue type and reason): Min Guard A for safety and cues for not sitting prematurely         Functional mobility during ADLs: Min guard;Minimal assistance;Rolling walker General ADL Comments: Pt presenting with decreased strength, balance, coorindation, cognition, and safety. Pt highly motivated to participate in therapy     Vision   Vision Assessment?: Yes Eye Alignment: Within Functional Limits Ocular Range of Motion: Within Functional Limits Alignment/Gaze Preference: Within Defined Limits Tracking/Visual Pursuits: Able to track stimulus in all quads without difficulty Convergence: Within functional limits Visual Fields: No apparent deficits Diplopia Assessment: (Denies diplopia) Depth Perception:  Undershoots Additional Comments: Pt tracking and demonstrating WFL for vision during tasks   Perception     Praxis       Cognition Arousal/Alertness: Awake/alert Behavior During Therapy: WFL for tasks assessed/performed Overall Cognitive Status: Impaired/Different from baseline Area of Impairment: Attention;Following commands;Safety/judgement;Awareness;Problem solving                   Current Attention Level: Selective   Following Commands: Follows one step commands inconsistently;Follows one step commands with increased time Safety/Judgement: Decreased awareness of safety;Decreased awareness of deficits Awareness: Emergent Problem Solving: Slow processing;Difficulty sequencing;Requires verbal cues General Comments: Pt presenting with increased awareness and orientation this session. Pt sustaining attention to ADLs. Challenged exectutive functioning with simple money management question, pt difficulty probelm solving and states "that is too hard right now." demonstrating increased awareness to deficits.         Exercises     Shoulder Instructions       General Comments During acitivty, pt presenting with SOB and fatigue. HR 126, RR 41, and SpO2 97% on RA    Pertinent Vitals/ Pain       Pain Assessment: Faces Faces Pain Scale: Hurts little more Pain Location: Stomach Pain Descriptors / Indicators: Discomfort;Grimacing Pain Intervention(s): Monitored during session;Limited activity within patient's tolerance;Repositioned  Home Living                                          Prior Functioning/Environment              Frequency  Min 3X/week        Progress Toward Goals  OT Goals(current goals can now be found in the care plan section)  Progress towards OT goals: Progressing toward goals  Acute Rehab OT Goals Patient Stated Goal: "Get back home" OT Goal Formulation: With patient Time For Goal Achievement: 05/26/18 Potential to Achieve Goals: Good ADL Goals Pt Will Perform Grooming: with set-up;with supervision;standing Pt Will Perform Upper Body  Dressing: with set-up;with supervision;sitting Pt Will Perform Lower Body Dressing: with set-up;with supervision;sit to/from stand Pt Will Transfer to Toilet: with set-up;with supervision;ambulating;regular height toilet Pt Will Perform Toileting - Clothing Manipulation and hygiene: with set-up;with supervision;sit to/from stand;sitting/lateral leans Additional ADL Goal #1: Pt will demonstrate increased ST memory to recall 4/4 simple grooming tasks with Min cues Additional ADL Goal #2: Pt will demonstrate increased safety awareness to perform ADLs with 2-3 cues for safety  Plan Discharge plan remains appropriate    Co-evaluation                 AM-PAC OT "6 Clicks" Daily Activity     Outcome Measure   Help from another person eating meals?: A Little Help from another person taking care of personal grooming?: A Little Help from another person toileting, which includes using toliet, bedpan, or urinal?: A Lot Help from another person bathing (including washing, rinsing, drying)?: A Lot Help from another person to put on and taking off regular upper body clothing?: A Little Help from another person to put on and taking off regular lower body clothing?: A Lot 6 Click Score: 15    End of Session Equipment Utilized During Treatment: Rolling walker  OT Visit Diagnosis: Unsteadiness on feet (R26.81);Other abnormalities of gait and mobility (R26.89);Muscle weakness (generalized) (M62.81);Other symptoms and signs involving cognitive function   Activity Tolerance Patient tolerated treatment well   Patient Left  in chair;with call bell/phone within reach;with chair alarm set;with nursing/sitter in room   Nurse Communication Mobility status        Time: [phone] OT Time Calculation (min): 33 min  Charges: OT General Charges $OT Visit: 1 Visit OT Treatments $Self Care/Home Management : 23-37 mins  [place], OTR/L Acute Rehab Pager: [phone] Office:  [place] 05/14/2018, 9:42 AM

## 2018-05-15 ENCOUNTER — Inpatient Hospital Stay (HOSPITAL_COMMUNITY): Payer: Medicaid Other

## 2018-05-15 LAB — GLUCOSE, CAPILLARY
Glucose-Capillary: 83 mg/dL (ref 70–99)
Glucose-Capillary: 84 mg/dL (ref 70–99)
Glucose-Capillary: 85 mg/dL (ref 70–99)
Glucose-Capillary: 86 mg/dL (ref 70–99)
Glucose-Capillary: 86 mg/dL (ref 70–99)
Glucose-Capillary: 94 mg/dL (ref 70–99)
Glucose-Capillary: 97 mg/dL (ref 70–99)

## 2018-05-15 NOTE — Progress Notes (Signed)
Occupational Therapy Treatment Patient Details Name: Jane Brooks MRN: [id number] DOB: [birth year] Today's Date: 05/15/2018    History of present illness 48 y/o female with pancreatic adenocarcinoma s/p chemotherapy admitted for planned whipple on 4/9. PCCM consulted for post-op hypotension, AKI and shock liver. Self-extubated on 4/10 however developed respiratory distress secondary to HCAP and was reintubated on 4/15 and extubated on 4/25.   OT comments  Patient seated in chair, eager to participate in OT session (even after nausea and emesis episode).  Requires min assist for LB bathing/dressing, min guard for transfers, and min assist -min guard for functional mobility for safety.  Patient able to complete 3 step trail making task with good recall of tasks to complete, but min cueing required for problem solving and safety. Improved awareness of need to work on executive functioning tasks, but declines and requests to rest. Continue to recommend CIR. Will follow.    Follow Up Recommendations  CIR;Supervision/Assistance - 24 hour    Equipment Recommendations  Other (comment)(TBD at next venue of care)    Recommendations for Other Services Rehab consult;PT consult;Speech consult    Precautions / Restrictions Precautions Precautions: Fall;Other (comment) Precaution Comments: pt with freq episodes of loose stool Restrictions Weight Bearing Restrictions: No       Mobility Bed Mobility Overal bed mobility: Needs Assistance Bed Mobility: Sit to Supine       Sit to supine: Min guard   General bed mobility comments: up in chair upon arrival, returned to supine with min guard for safety   Transfers Overall transfer level: Needs assistance Equipment used: Rolling walker (2 wheeled) Transfers: Sit to/from Stand Sit to Stand: Min guard         General transfer comment: Min Guard A for safety and pt requiring cues for hand placement    Balance Overall balance assessment:  Needs assistance Sitting-balance support: No upper extremity supported;Feet supported;Single extremity supported Sitting balance-Leahy Scale: Good     Standing balance support: During functional activity;No upper extremity supported Standing balance-Leahy Scale: Fair Standing balance comment: min guard standing with 0 hand support during grooming, preference to UE support                           ADL either performed or assessed with clinical judgement   ADL Overall ADL's : Needs assistance/impaired     Grooming: Oral care;Wash/dry hands;Min guard;Standing Grooming Details (indicate cue type and reason): min guard for safety      Lower Body Bathing: Minimal assistance;Sit to/from stand Lower Body Bathing Details (indicate cue type and reason): min assist for safety when bathing peri area  Upper Body Dressing : Set up;Sitting Upper Body Dressing Details (indicate cue type and reason): setup to don new gown  Lower Body Dressing: Sit to/from stand;Minimal assistance Lower Body Dressing Details (indicate cue type and reason): min assist for safety and balance while donning/doffing new mesh underwear  Toilet Transfer: Min guard;Ambulation;RW Toilet Transfer Details (indicate cue type and reason): for safety and balance         Functional mobility during ADLs: Min guard;Minimal assistance;Rolling walker General ADL Comments: pt highly motivated, continues to be limited by decreased strength/endurance and balance (as well as cognition)      Vision       Perception     Praxis      Cognition Arousal/Alertness: Awake/alert Behavior During Therapy: WFL for tasks assessed/performed Overall Cognitive Status: Impaired/Different from baseline Area of Impairment: Problem solving;Awareness  Awareness: Emergent Problem Solving: Slow processing;Difficulty sequencing;Requires verbal cues General Comments: pt able to complete 3 step trail  making task with good recall of all steps, but requires min cueing to problem solve through pathfinding and safety awareness with mobility         Exercises     Shoulder Instructions       General Comments VSS    Pertinent Vitals/ Pain       Pain Assessment: Faces Faces Pain Scale: Hurts a little bit Pain Location: Stomach Pain Descriptors / Indicators: Discomfort;Grimacing Pain Intervention(s): Monitored during session  Home Living                                          Prior Functioning/Environment              Frequency  Min 3X/week        Progress Toward Goals  OT Goals(current goals can now be found in the care plan section)  Progress towards OT goals: Progressing toward goals  Acute Rehab OT Goals Patient Stated Goal: "Get back home" OT Goal Formulation: With patient Time For Goal Achievement: 05/26/18 Potential to Achieve Goals: Good  Plan Discharge plan remains appropriate    Co-evaluation                 AM-PAC OT "6 Clicks" Daily Activity     Outcome Measure   Help from another person eating meals?: None Help from another person taking care of personal grooming?: A Little Help from another person toileting, which includes using toliet, bedpan, or urinal?: A Lot Help from another person bathing (including washing, rinsing, drying)?: A Little Help from another person to put on and taking off regular upper body clothing?: None Help from another person to put on and taking off regular lower body clothing?: A Lot 6 Click Score: 18    End of Session Equipment Utilized During Treatment: Rolling walker  OT Visit Diagnosis: Unsteadiness on feet (R26.81);Other abnormalities of gait and mobility (R26.89);Muscle weakness (generalized) (M62.81);Other symptoms and signs involving cognitive function   Activity Tolerance Patient tolerated treatment well   Patient Left in chair;with call bell/phone within reach;with chair alarm  set;with nursing/sitter in room   Nurse Communication Mobility status        Time: 9311-2162 OT Time Calculation (min): 41 min  Charges: OT General Charges $OT Visit: 1 Visit OT Treatments $Self Care/Home Management : 38-52 mins  Delight Stare, [place] Pager [phone] Office [phone]     Delight Stare 05/15/2018, 2:08 PM

## 2018-05-15 NOTE — Progress Notes (Signed)
Day 1 of 2 Calorie Count Note  48 hour calorie count ordered.  Diet: Regular diet with thin liquids Supplements:   Ensure Enlive po BID, each supplement provides 350 kcal and 20 grams of protein  Boost Breeze po BID, each supplement provides 250 kcal and 9 grams of protein  Breakfast: 20% intake (99 kcal, 6 grams of protein) Lunch: 20% intake (116 kcal, 4 grams of protein) Dinner: 50% intake (268 kcal, 13 grams of protein) Supplements: 950 kcal, 49 grams of protein  Day 1 Total intake: 1433 kcal (84% of minimum estimated kcal needs)  72 grams of protein (90% of minimum estimated protein needs)  Estimated Nutritional Needs:  Kcal:  1700-1900 Protein:  80-100 grams  Fluid:  > 1.7 L/day  Nutrition Dx:  Increased nutrient needs related to cancer and cancer related treatments as evidenced by estimated needs; ongoing  Goal: Pt to meet >/= 90% of their estimated nutrition needs; progressing   Intervention:   Continue calorie count. RD to follow up tomorrow with final calorie count.  Continue Ensure Enlive po BID, each supplement provides 350 kcal and 20 grams of protein  Continue Boost Breeze po BID, each supplement provides 250 kcal and 9 grams of protein  Corrin Parker, MS, RD, LDN Pager # [phone] After hours/ weekend pager # [phone]

## 2018-05-15 NOTE — Progress Notes (Signed)
20 Days Post-Op   Subjective/Chief Complaint: Had some nausea/1x emesis yesterday.  Also had a large loose stool.     Objective: Vital signs in last 24 hours: Temp:  [98.1 F (36.7 C)-98.8 F (37.1 C)] 98.7 F (37.1 C) (04/29 1124) Pulse Rate:  [93-104] 93 (04/29 1124) Resp:  [16-23] 19 (04/29 1124) BP: (128-136)/(75-84) 128/76 (04/29 1124) SpO2:  [97 %-100 %] 100 % (04/29 1124) Weight:  [68.2 kg] 68.2 kg (04/29 0500) Last BM Date: 05/15/18  Intake/Output from previous day: 04/28 0701 - 04/29 0700 In: 905.6 [P.O.:840; IV Piggyback:65.6] Out: 104 [Urine:2; Drains:100; Stool:2] Intake/Output this shift: Total I/O In: 240 [P.O.:240] Out: -   General appearance: cooperative, alert.  OOB Eyes: no icterus Resp: breathing comfortably Cardio: regular rate and rhythm GI: soft, incision healed, TF off, sl milky drain, but continues to clear Extremities: calves soft  Lab Results:  Recent Labs    05/13/18 0545  WBC 8.4  HGB 7.2*  HCT 22.9*  PLT 303   BMET Recent Labs    05/13/18 0545  NA 137  K 3.8  CL 106  CO2 23  GLUCOSE 101*  BUN 7  CREATININE 0.64  CALCIUM 7.7*   PT/INR No results for input(s): LABPROT, INR in the last 72 hours. ABG No results for input(s): PHART, HCO3 in the last 72 hours.  Invalid input(s): PCO2, PO2  Studies/Results: No results found.  Anti-infectives: Anti-infectives (From admission, onward)   Start     Dose/Rate Route Frequency Ordered Stop   05/04/18 2100  vancomycin (VANCOCIN) IVPB 750 mg/150 ml premix     750 mg 150 mL/hr over 60 Minutes Intravenous Every 12 hours 05/04/18 2027 05/16/18 2359   05/03/18 1000  fluconazole (DIFLUCAN) IVPB 400 mg  Status:  Discontinued     400 mg 100 mL/hr over 120 Minutes Intravenous Every 24 hours 05/02/18 0721 05/02/18 0855   05/02/18 0800  vancomycin (VANCOCIN) 500 mg in sodium chloride 0.9 % 100 mL IVPB  Status:  Discontinued     500 mg 100 mL/hr over 60 Minutes Intravenous Every 12 hours  05/02/18 0721 05/04/18 2027   05/02/18 0800  piperacillin-tazobactam (ZOSYN) IVPB 3.375 g     3.375 g 12.5 mL/hr over 240 Minutes Intravenous Every 8 hours 05/02/18 0721 05/16/18 2359   05/02/18 0730  fluconazole (DIFLUCAN) IVPB 800 mg     800 mg 200 mL/hr over 120 Minutes Intravenous  Once 05/02/18 0721 05/02/18 0952   04/30/18 2230  vancomycin (VANCOCIN) IVPB 750 mg/150 ml premix  Status:  Discontinued     750 mg 150 mL/hr over 60 Minutes Intravenous Every 48 hours 04/28/18 2133 04/29/18 1121   04/30/18 2230  vancomycin (VANCOCIN) IVPB 1000 mg/200 mL premix  Status:  Discontinued     1,000 mg 200 mL/hr over 60 Minutes Intravenous Every 48 hours 04/29/18 1121 04/30/18 0904   04/30/18 1000  vancomycin (VANCOCIN) IVPB 1000 mg/200 mL premix     1,000 mg 200 mL/hr over 60 Minutes Intravenous Every 24 hours 04/30/18 0904 05/01/18 1011   04/30/18 0915  ceFEPIme (MAXIPIME) 2 g in sodium chloride 0.9 % 100 mL IVPB  Status:  Discontinued     2 g 200 mL/hr over 30 Minutes Intravenous Every 12 hours 04/30/18 0905 05/02/18 0721   04/28/18 2130  ceFEPIme (MAXIPIME) 2 g in sodium chloride 0.9 % 100 mL IVPB  Status:  Discontinued     2 g 200 mL/hr over 30 Minutes Intravenous Every 24 hours 04/28/18  2113 04/30/18 0905   04/28/18 2130  vancomycin (VANCOCIN) 1,250 mg in sodium chloride 0.9 % 250 mL IVPB     1,250 mg 166.7 mL/hr over 90 Minutes Intravenous  Once 04/28/18 2129 04/29/18 0107   04/25/18 1530  ceFAZolin (ANCEF) IVPB 2g/100 mL premix     2 g 200 mL/hr over 30 Minutes Intravenous Every 8 hours 04/25/18 1520 04/25/18 1739   04/25/18 0600  ceFAZolin (ANCEF) IVPB 2g/100 mL premix     2 g 200 mL/hr over 30 Minutes Intravenous On call to O.R. 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  1 LN positive and vein margin positive.      Neuro- improved.  Continue enteral morphine.  Speech for cognition.   Increased  morphine for pain.  Doing better today.    Pulm/ID- reintubated for respiratory distress and increased WOB.  Zosyn/vanc should cover respiratory or GI pathogens.   Drain lipase very low, but drain #2 now milky.  Fever curve decreased; likely secondary to liver necrosis and pneumonia. Continue coverage for pneumonia and biliary pathogens which include gram negs, anerobes, MRSA and enterococcus.  Anticipate 2 week course of antibiotics.  Should stop 4/30.    CV-HR finally near normal.  GI-   LFTs back to normal.  Still infarction pattern on scan.  No sign of abscess. Previous liver duplex showed patent portal and hepatic veins.  Tube feeds were at goal prior to our attempt to do only oral intake.   Await full assessment of calorie count.  One drain now appearing cloudy.     Renal- a bit oliguric yesterday  FEN: recheck labs in AM.  Also recheck prealbumin.  Advance diet as tolerated.  May have delayed gastric emptying, watch for aspiration.  Speech working with.  Pt wants to try to get calories in PO.  Will do calorie count.    Endocrine- hyperglycemia, continue ssi, glucose under reasonable control. At risk of developing diabetes given amount of pancreas removed.  Will see how blood sugars settle out once acute stress improved.    Heme:  ABL anemia on anemia of chronic dx, anemia of chemotherapy.  stable  VTE ppx- SCDs, lovenox ppx  Plan: Once calorie count done, will decide if she needs some tube feeds.  Ok to go to rehab as early as tomorrow barring unforeseen issues.     LOS: 20 days    Jane Brooks 05/15/2018

## 2018-05-15 NOTE — Progress Notes (Signed)
Physical Therapy Treatment Patient Details Name: Jane Brooks MRN: [id number] DOB: [birth year] Today's Date: 05/15/2018    History of Present Illness 48 y/o female with pancreatic adenocarcinoma s/p chemotherapy admitted for planned whipple on 4/9. PCCM consulted for post-op hypotension, AKI and shock liver. Self-extubated on 4/10 however developed respiratory distress secondary to HCAP and was reintubated on 4/15 and extubated on 4/25.    PT Comments    Pt performed gait training with progression to gt without use of RW.  Pt initially nervous but tolerated well with minimal assistance.  Pt appears to have more OT deficits and is progressing well from a PT standpoint.  Plan for post acute rehab remains appropriate as patient lives home alone.  Plan next session for higher level balance activities.     Follow Up Recommendations  CIR     Equipment Recommendations  Rolling walker with 5" wheels    Recommendations for Other Services Rehab consult     Precautions / Restrictions Precautions Precautions: Fall;Other (comment) Precaution Comments: pt with freq episodes of loose stool Restrictions Weight Bearing Restrictions: No    Mobility  Bed Mobility Overal bed mobility: Needs Assistance Bed Mobility: Supine to Sit;Sit to Supine     Supine to sit: Supervision Sit to supine: Supervision   General bed mobility comments: No assistance needed.  Supervisiong for safety and line/lead management.    Transfers Overall transfer level: Needs assistance Equipment used: Rolling walker (2 wheeled) Transfers: Sit to/from Stand Sit to Stand: Min guard         General transfer comment: Min guard for safety no overt LOB.    Ambulation/Gait Ambulation/Gait assistance: Min assist Gait Distance (Feet): 200 Feet(x2 1st trial with RW and 2nd trial without) Assistive device: Rolling walker (2 wheeled);None Gait Pattern/deviations: Step-through pattern;Decreased stride length;Narrow base  of support Gait velocity: dec   General Gait Details: Pt progressing well performed gt training with RW with supervision and good use of RW.  Removed device to trial gt without and required min assistance intially with cues to increase stride and reciprocal armswing.  Slow and guarded without RW but no LOB.     Stairs             Wheelchair Mobility    Modified Rankin (Stroke Patients Only)       Balance     Sitting balance-Leahy Scale: Normal       Standing balance-Leahy Scale: Fair Standing balance comment: able to static stand without UE support.                              Cognition Arousal/Alertness: Awake/alert Behavior During Therapy: WFL for tasks assessed/performed Overall Cognitive Status: Within Functional Limits for tasks assessed                                 General Comments: Pt with in functional limits for tasks during PT session.        Exercises      General Comments        Pertinent Vitals/Pain Pain Assessment: Faces Faces Pain Scale: Hurts a little bit Pain Location: Stomach Pain Descriptors / Indicators: Discomfort;Grimacing Pain Intervention(s): Monitored during session;Repositioned    Home Living                      Prior Function  PT Goals (current goals can now be found in the care plan section) Acute Rehab PT Goals Patient Stated Goal: "Get back home" Potential to Achieve Goals: Good Progress towards PT goals: Progressing toward goals    Frequency    Min 3X/week      PT Plan Current plan remains appropriate    Co-evaluation              AM-PAC PT "6 Clicks" Mobility   Outcome Measure  Help needed turning from your back to your side while in a flat bed without using bedrails?: A Little Help needed moving from lying on your back to sitting on the side of a flat bed without using bedrails?: A Little Help needed moving to and from a bed to a chair (including  a wheelchair)?: A Little Help needed standing up from a chair using your arms (e.g., wheelchair or bedside chair)?: A Little Help needed to walk in hospital room?: A Lot Help needed climbing 3-5 steps with a railing? : A Lot 6 Click Score: 16    End of Session Equipment Utilized During Treatment: Gait belt Activity Tolerance: Patient tolerated treatment well Patient left: with nursing/sitter in room;in bed;with bed alarm set;with call bell/phone within reach Nurse Communication: Mobility status PT Visit Diagnosis: Unsteadiness on feet (R26.81);Muscle weakness (generalized) (M62.81);Difficulty in walking, not elsewhere classified (R26.2)     Time: 3888-7579 PT Time Calculation (min) (ACUTE ONLY): 16 min  Charges:  $Gait Training: 8-22 mins                     Governor Rooks, PTA Acute Rehabilitation Services Pager [phone] Office [phone]     Jane Brooks 05/15/2018, 5:21 PM

## 2018-05-15 NOTE — Progress Notes (Signed)
Patient having some wheezing on inspiration, denies SOB.  Jane Brooks with RT called to administer PRN Albuterol neb.

## 2018-05-15 NOTE — Progress Notes (Signed)
Pharmacy Antibiotic Note  Jane Brooks is a 48 y.o. female with pancreatic cancer, s/p Whipple on 4/9. On day # 9 Vancomycin and Zosyn for recurrent fevers, pneumonia, sepsis and intra-abdominal coverage.  Fevers thought due to liver necrosis and pneumonia. Has 2 abdominal drains - getting cultures of abdominal fluid today to help guide antimicrobials.  Plan to discontinue antibiotics after a 2 week course on 4/30.  Plan:  Continue Vancomcyin 750 mg IV q12h  Continue Zosyn 3.375 gm IV q8hr   Height: 4' 11"  (149.9 cm) Weight: 150 lb 5.7 oz (68.2 kg) IBW/kg (Calculated) : 43.2  Temp (24hrs), Avg:98.5 F (36.9 C), Min:98.1 F (36.7 C), Max:98.8 F (37.1 C)  Recent Labs  Lab 05/09/18 0425 05/10/18 0430 05/10/18 1730 05/10/18 2251 05/11/18 0542 05/12/18 0409 05/13/18 0545  WBC 15.9* 15.4*  --   --  11.2* 9.1 8.4  CREATININE 0.79 0.60  --   --  0.62 0.65 0.64  VANCOTROUGH  --   --  16  --   --   --   --   VANCOPEAK  --   --   --  38  --   --   --      Antimicrobials this admission: Cefazolin pre-op 4/9 Cefepime 4/12 >>4/15 Vanc 4/12 >>4/15, resumed 4/16 >>4/30 Zosyn 4/16 >> 4/30 Fluconazole 4/15 >> 4/16  Dose adjustments this admission:  4/18 peak 19, trough 8 on 500 mg IV q12h, calc AUC 315 > inc 736m q12h for est AUC 472  Microbiology results: 4/16 tracheal aspirate - few Candida albicans 4/16 blood x 2 - negative 4/16 urine - negative 4/12 blood x 2 - negative 4/9 MRSA PCR negative   CAlanda Slim PharmD, FNew Vision Surgical Center LLCClinical Pharmacist Please see AMION for all Pharmacists' Contact Phone Numbers 05/15/2018, 12:27 PM

## 2018-05-16 ENCOUNTER — Telehealth: Payer: Self-pay

## 2018-05-16 ENCOUNTER — Inpatient Hospital Stay (HOSPITAL_COMMUNITY)
Admission: RE | Admit: 2018-05-16 | Discharge: 2018-05-21 | DRG: 945 | Disposition: A | Discharge status: Home Health | Payer: Medicaid Other | Source: Intra-hospital | Attending: Physical Medicine & Rehabilitation | Admitting: Physical Medicine & Rehabilitation

## 2018-05-16 ENCOUNTER — Encounter (HOSPITAL_COMMUNITY): Payer: Self-pay

## 2018-05-16 ENCOUNTER — Other Ambulatory Visit: Payer: Self-pay

## 2018-05-16 DIAGNOSIS — R739 Hyperglycemia, unspecified: Secondary | ICD-10-CM | POA: Diagnosis present

## 2018-05-16 DIAGNOSIS — Z8042 Family history of malignant neoplasm of prostate: Secondary | ICD-10-CM

## 2018-05-16 DIAGNOSIS — R7303 Prediabetes: Secondary | ICD-10-CM | POA: Insufficient documentation

## 2018-05-16 DIAGNOSIS — Z934 Other artificial openings of gastrointestinal tract status: Secondary | ICD-10-CM

## 2018-05-16 DIAGNOSIS — F17210 Nicotine dependence, cigarettes, uncomplicated: Secondary | ICD-10-CM | POA: Diagnosis present

## 2018-05-16 DIAGNOSIS — E46 Unspecified protein-calorie malnutrition: Secondary | ICD-10-CM | POA: Diagnosis present

## 2018-05-16 DIAGNOSIS — Z8049 Family history of malignant neoplasm of other genital organs: Secondary | ICD-10-CM

## 2018-05-16 DIAGNOSIS — F419 Anxiety disorder, unspecified: Secondary | ICD-10-CM | POA: Diagnosis present

## 2018-05-16 DIAGNOSIS — E871 Hypo-osmolality and hyponatremia: Secondary | ICD-10-CM | POA: Diagnosis present

## 2018-05-16 DIAGNOSIS — N179 Acute kidney failure, unspecified: Secondary | ICD-10-CM | POA: Diagnosis present

## 2018-05-16 DIAGNOSIS — Z801 Family history of malignant neoplasm of trachea, bronchus and lung: Secondary | ICD-10-CM | POA: Diagnosis not present

## 2018-05-16 DIAGNOSIS — R000 Tachycardia, unspecified: Secondary | ICD-10-CM | POA: Diagnosis not present

## 2018-05-16 DIAGNOSIS — D62 Acute posthemorrhagic anemia: Secondary | ICD-10-CM

## 2018-05-16 DIAGNOSIS — R5381 Other malaise: Principal | ICD-10-CM | POA: Diagnosis present

## 2018-05-16 DIAGNOSIS — R339 Retention of urine, unspecified: Secondary | ICD-10-CM

## 2018-05-16 DIAGNOSIS — Z8507 Personal history of malignant neoplasm of pancreas: Secondary | ICD-10-CM

## 2018-05-16 DIAGNOSIS — Z833 Family history of diabetes mellitus: Secondary | ICD-10-CM

## 2018-05-16 DIAGNOSIS — Z6825 Body mass index (BMI) 25.0-25.9, adult: Secondary | ICD-10-CM | POA: Diagnosis not present

## 2018-05-16 DIAGNOSIS — Z8249 Family history of ischemic heart disease and other diseases of the circulatory system: Secondary | ICD-10-CM | POA: Diagnosis not present

## 2018-05-16 DIAGNOSIS — E876 Hypokalemia: Secondary | ICD-10-CM

## 2018-05-16 DIAGNOSIS — D696 Thrombocytopenia, unspecified: Secondary | ICD-10-CM | POA: Diagnosis present

## 2018-05-16 DIAGNOSIS — R609 Edema, unspecified: Secondary | ICD-10-CM

## 2018-05-16 DIAGNOSIS — R600 Localized edema: Secondary | ICD-10-CM

## 2018-05-16 DIAGNOSIS — Z90411 Acquired partial absence of pancreas: Secondary | ICD-10-CM | POA: Diagnosis not present

## 2018-05-16 DIAGNOSIS — Y95 Nosocomial condition: Secondary | ICD-10-CM

## 2018-05-16 DIAGNOSIS — J189 Pneumonia, unspecified organism: Secondary | ICD-10-CM

## 2018-05-16 LAB — COMPREHENSIVE METABOLIC PANEL
ALT: 22 U/L (ref 0–44)
AST: 32 U/L (ref 15–41)
Albumin: 2.2 g/dL — ABNORMAL LOW (ref 3.5–5.0)
Alkaline Phosphatase: 76 U/L (ref 38–126)
Anion gap: 7 (ref 5–15)
BUN: <5 mg/dL — ABNORMAL LOW (ref 6–20)
CO2: 25 mmol/L (ref 22–32)
Calcium: 8.1 mg/dL — ABNORMAL LOW (ref 8.9–10.3)
Chloride: 102 mmol/L (ref 98–111)
Creatinine, Ser: 0.75 mg/dL (ref 0.44–1.00)
GFR calc Af Amer: >60 mL/min (ref >60)
GFR calc non Af Amer: >60 mL/min (ref >60)
Glucose, Bld: 89 mg/dL (ref 70–99)
Potassium: 3.5 mmol/L (ref 3.5–5.1)
Sodium: 134 mmol/L — ABNORMAL LOW (ref 135–145)
Total Bilirubin: 0.6 mg/dL (ref 0.3–1.2)
Total Protein: 6.3 g/dL — ABNORMAL LOW (ref 6.5–8.1)

## 2018-05-16 LAB — CBC
HCT: 22.7 % — ABNORMAL LOW (ref 36.0–46.0)
Hemoglobin: 7.3 g/dL — ABNORMAL LOW (ref 12.0–15.0)
MCH: 24.4 pg — ABNORMAL LOW (ref 26.0–34.0)
MCHC: 32.2 g/dL (ref 30.0–36.0)
MCV: 75.9 fL — ABNORMAL LOW (ref 80.0–100.0)
Platelets: 216 10*3/uL (ref 150–400)
RBC: 2.99 MIL/uL — ABNORMAL LOW (ref 3.87–5.11)
RDW: 22.5 % — ABNORMAL HIGH (ref 11.5–15.5)
WBC: 4.1 10*3/uL (ref 4.0–10.5)
nRBC: 0 % (ref 0.0–0.2)

## 2018-05-16 LAB — GLUCOSE, CAPILLARY
Glucose-Capillary: 77 mg/dL (ref 70–99)
Glucose-Capillary: 104 mg/dL — ABNORMAL HIGH (ref 70–99)
Glucose-Capillary: 109 mg/dL — ABNORMAL HIGH (ref 70–99)
Glucose-Capillary: 90 mg/dL (ref 70–99)
Glucose-Capillary: 86 mg/dL (ref 70–99)

## 2018-05-16 LAB — PREALBUMIN: Prealbumin: 13.6 mg/dL — ABNORMAL LOW (ref 18–38)

## 2018-05-16 LAB — AMMONIA: Ammonia: 20 umol/L (ref 9–35)

## 2018-05-16 MED ORDER — ENOXAPARIN SODIUM 40 MG/0.4ML ~~LOC~~ SOLN: 40.0000 mg | SUBCUTANEOUS | Status: DC

## 2018-05-16 MED ORDER — ONDANSETRON 4 MG PO TBDP: 4.0000 mg | ORAL_TABLET | Freq: Four times a day (QID) | ORAL | 0 refills | Status: DC | PRN

## 2018-05-16 MED ORDER — PANTOPRAZOLE SODIUM 40 MG PO TBEC: 40.0000 mg | DELAYED_RELEASE_TABLET | Freq: Every day | ORAL | 6 refills | Status: DC

## 2018-05-16 MED ORDER — PIPERACILLIN-TAZOBACTAM 3.375 G IVPB
3.3750 g | Freq: Three times a day (TID) | INTRAVENOUS | Status: AC
  Administered 2018-05-16: 18:00:00 3.375 g via INTRAVENOUS
  Filled 2018-05-16: qty 50

## 2018-05-16 MED ORDER — INSULIN ASPART 100 UNIT/ML ~~LOC~~ SOLN
0.0000 [IU] | Freq: Three times a day (TID) | SUBCUTANEOUS | Status: DC
  Administered 2018-05-19: 1 [IU] via SUBCUTANEOUS

## 2018-05-16 MED ORDER — ENSURE ENLIVE PO LIQD: 237.0000 mL | Freq: Three times a day (TID) | ORAL | Status: DC

## 2018-05-16 MED ORDER — FLEET ENEMA 7-19 GM/118ML RE ENEM: 1.0000 | ENEMA | Freq: Once | RECTAL | Status: DC | PRN

## 2018-05-16 MED ORDER — PANTOPRAZOLE SODIUM 40 MG PO TBEC
40.0000 mg | DELAYED_RELEASE_TABLET | Freq: Every day | ORAL | Status: DC
  Administered 2018-05-17 – 2018-05-21 (×5): 40 mg via ORAL
  Filled 2018-05-16 (×6): qty 1

## 2018-05-16 MED ORDER — FREE WATER: 50.0000 mL | Freq: Three times a day (TID) | 0 refills | Status: DC

## 2018-05-16 MED ORDER — VANCOMYCIN HCL IN DEXTROSE 750-5 MG/150ML-% IV SOLN
750.0000 mg | Freq: Two times a day (BID) | INTRAVENOUS | Status: AC
  Administered 2018-05-16: 21:00:00 750 mg via INTRAVENOUS
  Filled 2018-05-16: qty 150

## 2018-05-16 MED ORDER — POLYETHYLENE GLYCOL 3350 17 G PO PACK: 17.0000 g | PACK | Freq: Every day | ORAL | Status: DC | PRN

## 2018-05-16 MED ORDER — GUAIFENESIN-DM 100-10 MG/5ML PO SYRP: 5.0000 mL | ORAL_SOLUTION | Freq: Four times a day (QID) | ORAL | Status: DC | PRN

## 2018-05-16 MED ORDER — POTASSIUM CHLORIDE CRYS ER 20 MEQ PO TBCR: 20.0000 meq | EXTENDED_RELEASE_TABLET | Freq: Two times a day (BID) | ORAL | 0 refills | Status: DC

## 2018-05-16 MED ORDER — FREE WATER
50.0000 mL | Freq: Three times a day (TID) | Status: DC
  Administered 2018-05-16: 15:00:00 50 mL

## 2018-05-16 MED ORDER — METHOCARBAMOL 500 MG PO TABS: 500.0000 mg | ORAL_TABLET | Freq: Four times a day (QID) | ORAL | 0 refills | Status: DC | PRN

## 2018-05-16 MED ORDER — PANCRELIPASE (LIP-PROT-AMYL) 36000-114000 UNITS PO CPEP
36000.0000 [IU] | ORAL_CAPSULE | Freq: Three times a day (TID) | ORAL | Status: DC
  Administered 2018-05-16 – 2018-05-21 (×14): 36000 [IU] via ORAL
  Filled 2018-05-16 (×17): qty 1

## 2018-05-16 MED ORDER — PROCHLORPERAZINE MALEATE 5 MG PO TABS
10.0000 mg | ORAL_TABLET | Freq: Four times a day (QID) | ORAL | Status: DC | PRN
  Administered 2018-05-17 – 2018-05-20 (×5): 10 mg via ORAL
  Filled 2018-05-16 (×7): qty 2

## 2018-05-16 MED ORDER — ACETAMINOPHEN 325 MG PO TABS
325.0000 mg | ORAL_TABLET | ORAL | Status: DC | PRN
  Filled 2018-05-16: qty 2

## 2018-05-16 MED ORDER — ONDANSETRON 4 MG PO TBDP
4.0000 mg | ORAL_TABLET | Freq: Four times a day (QID) | ORAL | Status: DC | PRN
  Administered 2018-05-16: 4 mg via ORAL
  Filled 2018-05-16 (×2): qty 1

## 2018-05-16 MED ORDER — SODIUM CHLORIDE 0.9% FLUSH: 10.0000 mL | INTRAVENOUS | Status: DC | PRN

## 2018-05-16 MED ORDER — MORPHINE SULFATE 15 MG PO TABS
15.0000 mg | ORAL_TABLET | ORAL | Status: DC
  Administered 2018-05-16 – 2018-05-21 (×29): 15 mg via ORAL
  Filled 2018-05-16 (×29): qty 1

## 2018-05-16 MED ORDER — ENOXAPARIN SODIUM 40 MG/0.4ML ~~LOC~~ SOLN
40.0000 mg | SUBCUTANEOUS | Status: DC
  Administered 2018-05-16 – 2018-05-20 (×5): 40 mg via SUBCUTANEOUS
  Filled 2018-05-16 (×5): qty 0.4

## 2018-05-16 MED ORDER — BOOST / RESOURCE BREEZE PO LIQD CUSTOM: 1.0000 | Freq: Two times a day (BID) | ORAL | Status: DC

## 2018-05-16 MED ORDER — PROCHLORPERAZINE MALEATE 10 MG PO TABS: 10.0000 mg | ORAL_TABLET | Freq: Four times a day (QID) | ORAL | 0 refills | Status: DC | PRN

## 2018-05-16 MED ORDER — INSULIN ASPART 100 UNIT/ML ~~LOC~~ SOLN: 0.0000 [IU] | Freq: Every day | SUBCUTANEOUS | Status: DC

## 2018-05-16 MED ORDER — BISACODYL 10 MG RE SUPP: 10.0000 mg | Freq: Every day | RECTAL | Status: DC | PRN

## 2018-05-16 MED ORDER — PROCHLORPERAZINE EDISYLATE 10 MG/2ML IJ SOLN: 5.0000 mg | Freq: Four times a day (QID) | INTRAMUSCULAR | Status: DC | PRN

## 2018-05-16 MED ORDER — POTASSIUM CHLORIDE CRYS ER 20 MEQ PO TBCR
20.0000 meq | EXTENDED_RELEASE_TABLET | Freq: Two times a day (BID) | ORAL | Status: DC
  Administered 2018-05-16 – 2018-05-21 (×10): 20 meq via ORAL
  Filled 2018-05-16 (×10): qty 1

## 2018-05-16 MED ORDER — METHOCARBAMOL 500 MG PO TABS: 500.0000 mg | ORAL_TABLET | Freq: Four times a day (QID) | ORAL | Status: DC | PRN

## 2018-05-16 MED ORDER — ALBUTEROL SULFATE (2.5 MG/3ML) 0.083% IN NEBU: 2.5000 mg | INHALATION_SOLUTION | RESPIRATORY_TRACT | 12 refills | Status: DC | PRN

## 2018-05-16 MED ORDER — DIPHENHYDRAMINE HCL 12.5 MG/5ML PO ELIX: 12.5000 mg | ORAL_SOLUTION | Freq: Four times a day (QID) | ORAL | Status: DC | PRN

## 2018-05-16 MED ORDER — BETHANECHOL CHLORIDE 10 MG PO TABS
10.0000 mg | ORAL_TABLET | Freq: Three times a day (TID) | ORAL | Status: DC
  Administered 2018-05-16 – 2018-05-21 (×14): 10 mg via ORAL
  Filled 2018-05-16 (×14): qty 1

## 2018-05-16 MED ORDER — SODIUM CHLORIDE 0.9% FLUSH
10.0000 mL | Freq: Two times a day (BID) | INTRAVENOUS | Status: DC
  Administered 2018-05-16: 10 mL

## 2018-05-16 MED ORDER — ALUM & MAG HYDROXIDE-SIMETH 200-200-20 MG/5ML PO SUSP: 30.0000 mL | ORAL | Status: DC | PRN

## 2018-05-16 MED ORDER — MORPHINE SULFATE 15 MG PO TABS: 15.0000 mg | ORAL_TABLET | ORAL | 0 refills | Status: DC

## 2018-05-16 MED ORDER — CHLORHEXIDINE GLUCONATE CLOTH 2 % EX PADS
6.0000 | MEDICATED_PAD | Freq: Every day | CUTANEOUS | Status: DC
  Administered 2018-05-18 – 2018-05-19 (×2): 6 via TOPICAL

## 2018-05-16 MED ORDER — TRAZODONE HCL 50 MG PO TABS: 25.0000 mg | ORAL_TABLET | Freq: Every evening | ORAL | Status: DC | PRN

## 2018-05-16 MED ORDER — PANCRELIPASE (LIP-PROT-AMYL) 36000-114000 UNITS PO CPEP: 36000.0000 [IU] | ORAL_CAPSULE | Freq: Three times a day (TID) | ORAL | 6 refills | Status: DC

## 2018-05-16 MED ORDER — ORAL CARE MOUTH RINSE
15.0000 mL | Freq: Two times a day (BID) | OROMUCOSAL | Status: DC
  Administered 2018-05-16 – 2018-05-20 (×6): 15 mL via OROMUCOSAL

## 2018-05-16 MED ORDER — ONDANSETRON HCL 4 MG/2ML IJ SOLN: 4.0000 mg | Freq: Four times a day (QID) | INTRAMUSCULAR | Status: DC | PRN

## 2018-05-16 MED ORDER — ALBUTEROL SULFATE (2.5 MG/3ML) 0.083% IN NEBU: 2.5000 mg | INHALATION_SOLUTION | RESPIRATORY_TRACT | Status: DC | PRN

## 2018-05-16 NOTE — TOC Transition Note (Signed)
Transition of Care [facility]) - CM/SW Discharge Note   Patient Details  Name: Jane Brooks MRN: [id number] Date of Birth: Jul 13, 1970  Transition of Care [facility]) CM/SW Contact:  Ella Bodo, RN Phone Number: 05/16/2018, 2:25 PM   Clinical Narrative:  48 y/o female with pancreatic adenocarcinoma s/p chemotherapy admitted for planned whipple on 4/9. PCCM consulted for post-op hypotension, AKI and shock liver. Self-extubated on 4/10 however developed respiratory distress secondary to HCAP and was reintubated on 4/15 and extubated on 4/25.  PT/OT recommending CIR; family able to provide assistance at discharge.      Final next level of care: IP Rehab Facility Barriers to Discharge: No Barriers Identified                         Discharge Plan and Services  Pt medically stable for discharge today.  Plan dc to [facility] when bed available.                                        Readmission Risk Interventions Readmission Risk Prevention Plan 05/16/2018  Transportation Screening Complete  Medication Review Press photographer) Complete  PCP or Specialist appointment within 3-5 days of discharge Not Complete  PCP/Specialist Appt Not Complete comments Pt discharging to [place] or [place] Not Complete  HRI or Home Care Consult Pt Refusal Comments Pt discharging to CIR  SW Recovery Care/Counseling Consult Not Complete  SW Consult Not Complete Comments Pt discharging to [place] Not Applicable   Reinaldo Raddle, RN, BSN  Trauma/Neuro ICU Case Manager [phone]

## 2018-05-16 NOTE — Progress Notes (Signed)
Physical Therapy Treatment Patient Details Name: Jane Brooks MRN: [id number] DOB: [birth year] Today's Date: 05/16/2018    History of Present Illness 48 y/o female with pancreatic adenocarcinoma s/p chemotherapy admitted for planned whipple on 4/9. PCCM consulted for post-op hypotension, AKI and shock liver. Self-extubated on 4/10 however developed respiratory distress secondary to HCAP and was reintubated on 4/15 and extubated on 4/25.    PT Comments    Pt very motivated to perform PT tx.  Continued gait training without device and patient responding well with min guard to supervision level of assistance.  Challenge patient to high level balance activities and she required use of railing for support.  Plan for d/c to rehab today to continue balance training and address speech/OT needs.  Pt remains to require more OT/speech needs at this time.   Follow Up Recommendations  CIR     Equipment Recommendations  Rolling walker with 5" wheels    Recommendations for Other Services       Precautions / Restrictions Precautions Precautions: Fall;Other (comment) Precaution Comments: pt with freq episodes of loose stool Restrictions Weight Bearing Restrictions: No    Mobility  Bed Mobility               General bed mobility comments: Seated in recliner on arrival.    Transfers Overall transfer level: Needs assistance Equipment used: Rolling walker (2 wheeled) Transfers: Sit to/from Stand Sit to Stand: Supervision         General transfer comment: Close supervision for safety.    Ambulation/Gait Ambulation/Gait assistance: Min guard Gait Distance (Feet): 300 Feet Assistive device: None Gait Pattern/deviations: Step-through pattern;Decreased stride length;Narrow base of support Gait velocity: dec   General Gait Details: Cues for reciprocal armswing, increasing stride length and maintaining posture.   Stairs Stairs: Yes Stairs assistance: Min guard;Min assist Stair  Management: One rail Right Number of Stairs: 6 General stair comments: Cues for sequencing.  Min guard to ascend and min assistance to descend.  pt slow and guarded.     Wheelchair Mobility    Modified Rankin (Stroke Patients Only)       Balance Overall balance assessment: Needs assistance   Sitting balance-Leahy Scale: Normal       Standing balance-Leahy Scale: Fair Standing balance comment: able to static stand without UE support.               High level balance activites: Side stepping;Backward walking;Direction changes;Turns(toe walking, and tandem gt) High Level Balance Comments: x15 ft x 2 trials each activity.  Pt required railing support for backward walking, tandem gait and toe walking.  Fatigues quickly.              Cognition Arousal/Alertness: Awake/alert Behavior During Therapy: WFL for tasks assessed/performed Overall Cognitive Status: Within Functional Limits for tasks assessed                                 General Comments: Pt with in functional limits for tasks during PT session.  Did not formally assess cognition.      Exercises      General Comments        Pertinent Vitals/Pain Pain Assessment: Faces Pain Score: 4  Pain Location: Stomach Pain Descriptors / Indicators: Tightness Pain Intervention(s): Monitored during session;Repositioned    Home Living     Available Help at Discharge: Friend(s);Available PRN/intermittently Type of Home: Apartment     Home Layout: One level  Prior Function        Comments: worked last year; not for several months   PT Goals (current goals can now be found in the care plan section) Acute Rehab PT Goals Patient Stated Goal: "Get back home" Potential to Achieve Goals: Good Progress towards PT goals: Progressing toward goals    Frequency    Min 3X/week      PT Plan Current plan remains appropriate    Co-evaluation              AM-PAC PT "6 Clicks"  Mobility   Outcome Measure  Help needed turning from your back to your side while in a flat bed without using bedrails?: None Help needed moving from lying on your back to sitting on the side of a flat bed without using bedrails?: None Help needed moving to and from a bed to a chair (including a wheelchair)?: None Help needed standing up from a chair using your arms (e.g., wheelchair or bedside chair)?: None Help needed to walk in hospital room?: A Little Help needed climbing 3-5 steps with a railing? : A Little 6 Click Score: 22    End of Session Equipment Utilized During Treatment: Gait belt Activity Tolerance: Patient tolerated treatment well Patient left: with nursing/sitter in room;in chair;with call bell/phone within reach Nurse Communication: Mobility status PT Visit Diagnosis: Unsteadiness on feet (R26.81);Muscle weakness (generalized) (M62.81);Difficulty in walking, not elsewhere classified (R26.2)     Time: 7124-5809 PT Time Calculation (min) (ACUTE ONLY): 20 min  Charges:  $Gait Training: 8-22 mins                     Governor Rooks, PTA Acute Rehabilitation Services Pager [phone] Office [phone]     Jane Brooks 05/16/2018, 1:35 PM

## 2018-05-16 NOTE — Progress Notes (Addendum)
Day 2 of 2 Calorie Count Note  RD working remotely.  48 hour calorie count ordered.  Diet: Regular diet with thin liquids Supplements:   Ensure Enlive po BID, each supplement provides 350 kcal and 20 grams of protein  Boost Breeze po BID, each supplement provides 250 kcal and 9 grams of protein  Breakfast: 133 kcal, 6 grams of protein Lunch: 124 kcal, 1 grams of protein Dinner: 0% intake Supplements: 850 kcal, 38 grams of protein  Day 2 Total intake: 1107 kcal (65% of minimum estimated kcal needs)  45 grams of protein (56% of minimum estimated protein needs)  Estimated Nutritional Needs:  Kcal:  1700-1900 Protein:  80-100 grams  Fluid:  > 1.7 L/day  RD contacted pt via inpatient room phone. Pt reports she has been trying to eat small frequent meals throughout the day as she reports when she forces herself to consume large quantities then she develops n/v. Pt educated on the importance of adequate caloric and protein needs and encouraged to consume her supplements. Pt reports understanding of information discussed. Plans for CIR today. May discontinue use of tube feeding via J-tube if pt continues to consume her oral supplements and continue to improve on intake. Will additionally increase Ensure to TID to aid in adequate nutrition. Pt motivated to improve on her po intake.   Nutrition Dx:  Increased nutrient needs related to cancer and cancer related treatments as evidenced by estimated needs; ongoing  Goal: Pt to meet >/= 90% of their estimated nutrition needs; progressing   Intervention:   Discontinue calorie count.  Provide Ensure Enlive po TID, each supplement provides 350 kcal and 20 grams of protein  Continue Boost Breeze po BID, each supplement provides 250 kcal and 9 grams of protein  Corrin Parker, MS, RD, LDN Pager # [phone] After hours/ weekend pager # [phone]

## 2018-05-16 NOTE — Telephone Encounter (Signed)
Spoke with patient about upcoming appointment, she is still in the hospital, now being moved to Rehab to work on strengthening.  Dr. Burr Medico made aware.

## 2018-05-16 NOTE — Discharge Instructions (Signed)
Surgical [facility] Care Surgical drains are used to remove extra fluid that normally builds up in a surgical wound after surgery. A surgical drain helps to heal a surgical wound. Different kinds of surgical drains include:  Active drains. These drains use suction to pull drainage away from the surgical wound. Drainage flows through a tube to a container outside of the body. It is important to keep the bulb or the drainage container flat (compressed) at all times, except while you empty it. Flattening the bulb or container creates suction. The two most common types of active drains are bulb drains and Hemovac drains.  Passive drains. These drains allow fluid to drain naturally, by gravity. Drainage flows through a tube to a bandage (dressing) or a container outside of the body. Passive drains do not need to be emptied. The most common type of passive drain is the Penrose drain. A drain is placed during surgery. Immediately after surgery, drainage is usually bright red and a little thicker than water. The drainage may gradually turn yellow or pink and become thinner. It is likely that your health care provider will remove the drain when the drainage stops or when the amount decreases to 1-2 Tbsp (15-30 mL) during a 24-hour period. How to care for your surgical drain It is important to care for your drain to prevent infection. If your drain is placed at your back, or any other hard-to-reach area, ask another person to assist you in performing the following steps:  Keep the skin around the drain dry and covered with a dressing at all times.  Check your drain area every day for signs of infection. Check for: ? More redness, swelling, or pain. ? Pus or a bad smell. ? Cloudy drainage. Follow instructions from your health care provider about how to take care of your drain and how to change your dressing. Change your dressing at least one time every day. Change it more often if needed to keep the dressing  dry. Make sure you: 1. Gather your supplies, including: ? Tape. ? Germ-free cleaning solution (sterile saline). ? Split gauze drain sponge: 4 x 4 inches (10 x 10 cm). ? Gauze square: 4 x 4 inches (10 x 10 cm). 2. Wash your hands with soap and water before you change your dressing. If soap and water are not available, use hand sanitizer. 3. Remove the old dressing. Avoid using scissors to do that. 4. Use sterile saline to clean your skin around the drain. 5. Place the tube through the slit in a drain sponge. Place the drain sponge so that it covers your wound. 6. Place the gauze square or another drain sponge on top of the drain sponge that is on the wound. Make sure the tube is between those layers. 7. Tape the dressing to your skin. 8. If you have an active bulb or Hemovac drain, tape the drainage tube to your skin 1-2 inches (2.5-5 cm) below the place where the tube enters your body. Taping keeps the tube from pulling on any stitches (sutures) that you have. 9. Wash your hands with soap and water. 10. Write down the color of your drainage and how often you change your dressing. How to empty your active bulb or Hemovac drain  1. Make sure that you have a measuring cup that you can empty your drainage into. 2. Wash your hands with soap and water. If soap and water are not available, use hand sanitizer. 3. Gently move your fingers down the  tube while squeezing very lightly. This is called stripping the tube. This clears any drainage, clots, or tissue from the tube. ? Do not pull on the tube. ? You may need to strip the tube several times every day to keep the tube clear. 4. Open the bulb cap or the drain plug. Do not touch the inside of the cap or the bottom of the plug. 5. Empty all of the drainage into the measuring cup. 6. Compress the bulb or the container and replace the cap or the plug. To compress the bulb or the container, squeeze it firmly in the middle while you close the cap or plug  the container. 7. Write down the amount of drainage that you have in each 24-hour period. If you have less than 2 Tbsp (30 mL) of drainage during 24 hours, contact your health care provider. 8. Flush the drainage down the toilet. 9. Wash your hands with soap and water. Contact a health care provider if:  You have more redness, swelling, or pain around your drain area.  The amount of drainage that you have is increasing instead of decreasing.  You have pus or a bad smell coming from your drain area.  You have a fever.  You have drainage that is cloudy.  There is a sudden stop or a sudden decrease in the amount of drainage that you have.  Your tube falls out.  Your active draindoes not stay compressedafter you empty it. Summary  Surgical drains are used to remove extra fluid that normally builds up in a surgical wound after surgery.  Different kinds of surgical drains include active drains and passive drains. Active drains use suction to pull drainage away from the surgical wound, and passive drains allow fluid to drain naturally.  It is important to care for your drain to prevent infection. If your drain is placed at your back, or any other hard-to-reach area, ask another person to assist you.  Contact your health care provider if you have more redness, swelling, or pain around your drain area. This information is not intended to replace advice given to you by your health care provider. Make sure you discuss any questions you have with your health care provider. Document Released: 12/31/1999 Document Revised: 01/25/2017 Document Reviewed: 07/22/2014 Elsevier Interactive Patient Education  2019 [facility], [place] [phone]  ABDOMINAL SURGERY: POST OP INSTRUCTIONS  Always review your discharge instruction sheet given to you by the facility where your surgery was performed.  IF YOU HAVE DISABILITY OR FAMILY LEAVE FORMS, YOU MUST BRING  THEM TO THE OFFICE FOR PROCESSING.  PLEASE DO NOT GIVE THEM TO YOUR DOCTOR.  1. A prescription for pain medication may be given to you upon discharge.  Take your pain medication as prescribed, if needed.  If narcotic pain medicine is not needed, then you may take acetaminophen (Tylenol) or ibuprofen (Advil) as needed. 2. Take your usually prescribed medications unless otherwise directed. 3. If you need a refill on your pain medication, please contact your pharmacy. They will contact our office to request authorization.  Prescriptions will not be filled after 5pm or on week-ends. 4. You should follow a light diet the first few days after arrival home, such as soup and crackers, pudding, etc.unless your doctor has advised otherwise. A high-fiber, low fat diet can be resumed as tolerated.   Be sure to include lots of fluids daily. Most patients will experience some swelling  and bruising on the chest and neck area.  Ice packs will help.  Swelling and bruising can take several days to resolve 5. Most patients will experience some swelling and bruising in the area of the incision. Ice pack will help. Swelling and bruising can take several days to resolve..  6. It is common to experience some constipation if taking pain medication after surgery.  Increasing fluid intake and taking a stool softener will usually help or prevent this problem from occurring.  A mild laxative (Milk of Magnesia or Miralax) should be taken according to package directions if there are no bowel movements after 48 hours. 7.  You may have steri-strips (small skin tapes) in place directly over the incision.  These strips should be left on the skin for 10-14 days.  If your surgeon used skin glue on the incision, you may shower in 48 hours.  The glue will flake off over the next 2-3 weeks.  Any sutures or staples will be removed at the office during your follow-up visit. You may find that a light gauze bandage over your incision may keep your  staples from being rubbed or pulled. You may shower and replace the bandage daily. 8. ACTIVITIES:  You may resume regular (light) daily activities beginning the next day--such as daily self-care, walking, climbing stairs--gradually increasing activities as tolerated.  You may have sexual intercourse when it is comfortable.  Refrain from any heavy lifting or straining until approved by your doctor. a. You may drive when you no longer are taking prescription pain medication, you can comfortably wear a seatbelt, and you can safely maneuver your car and apply brakes b. Return to Work: __________8 weeks if applicable_________________________ 73. You should see your doctor in the office for a follow-up appointment approximately two weeks after your surgery.  Make sure that you call for this appointment within a day or two after you arrive home to insure a convenient appointment time. OTHER INSTRUCTIONS:  _____________________________________________________________ _____________________________________________________________  WHEN TO CALL YOUR DOCTOR: 1. Fever over 101.0 2. Inability to urinate 3. Nausea and/or vomiting 4. Extreme swelling or bruising 5. Continued bleeding from incision. 6. Increased pain, redness, or drainage from the incision. 7. Difficulty swallowing or breathing 8. Muscle cramping or spasms. 9. Numbness or tingling in hands or feet or around lips.  The clinic staff is available to answer your questions during regular business hours.  Please dont hesitate to call and ask to speak to one of the nurses if you have concerns.  For further questions, please visit [email or web address]

## 2018-05-16 NOTE — Progress Notes (Signed)
Jane Gong, Jane Brooks  Rehab Admission Coordinator  Physical Medicine and Rehabilitation  PMR Pre-admission  Signed  Date of Service:  05/16/2018 12:43 PM       Related encounter: Admission (Discharged) from 04/25/2018 in [facility]         Show:Clear all [x] Manual[x] Template[x] Copied  Added by: [x] Jane Gong, Jane Brooks  [] Hover for details PMR Admission Coordinator Pre-Admission Assessment  Patient: Jane Brooks is an 48 y.o., female MRN: [id number] DOB: June 30, 1970 Height: 4' 11"  (149.9 cm) Weight: 66.4 kg                                                                                                                                                  Insurance Information HMO:     PPO:      PCP:      IPA:      80/20:      OTHER:  PRIMARY: Medicaid [place] [employer]      Policy#: [id number]      Subscriber: pt Benefits:  Phone #: passport one online     Name: 05/16/2018 Eff. Date: active     [facility]  Medicaid Application Date:       Case Manager:  Disability Application Date:       Case Worker:   The "Data Collection Information Summary" for patients in Inpatient Rehabilitation Facilities with attached "Privacy Act [place] Records" was provided and verbally reviewed with: N/A  Emergency Contact Information         Contact Information    Name Relation Home Work Jane Brooks Other   [phone]   Jane Brooks Sister   [phone]   Jane Brooks, Jane Brooks     Current Medical History  Patient Admitting Diagnosis: debility after Whipple procedure for pancreatic cancer  History of Present Illness:    Jane Rodriguezis a 48 y.o.femalewith 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 She was admitted on 04/25/18 for Whipple procedure with  portal vein reconstruction, placement of pancreatic stent and jejunostomy feeding tube by Dr. Barry Brooks. Post op course complicated by hypotension with AKI, shocked liver, tachycardia and shock due to SIRS. She tolerated extubation on 4/10 and AKI resolved with hydration. On 4/12, she started developing increase WOB and was started on cefepime for RUL HCAP. She required reintubation 4/15 due to persistent fevers, poor pain control and anxiety. Antibiotics broadened and she was started on enteral morphine. Thrombocytopenia treated with FFP and MRI abdomen showed right hepatic necrosis. CT head negative and hepatic encephalopathy resolving with addition of lactulose. She tolerated extubation to [place] on 4/25 and mentation improving and confusion resolving. She has been weaned off pressors and renal status  back to baseline. Hospital course significant for anemia of chronic disease which is being monitored, electrolyte abnormalities, diarrhea, urinary retention, tachycardia, low grade fevers, hyperglycemia--at risk for diabetes and dysphagia. Tube feeds d/c due to diarrhea?and patient upgraded  To regular diet with thin liquids. 48 hr calorie count in process. Receiving Ensure, and Boost. Dr. Barry Brooks encourages use of J tube as needed. One JP remains and surgeon to follow up next week for possible removal.   Past Medical History      Past Medical History:  Diagnosis Date  . Family history of lung cancer   . Family history of prostate cancer   . Family history of uterine cancer   . Gallstones 10/2017  . Pancreatic cancer ([place])   . Pneumothorax, closed, traumatic    years ago  . PONV (postoperative nausea and vomiting)     Family History  family history includes COPD in her mother; Diabetes in her mother and sister; Hypertension in her mother, sister, and sister; Lung cancer in her maternal grandmother; Uterine cancer (age of onset: 90) in her mother.  Prior Rehab/Hospitalizations:  Has the  patient had prior rehab or hospitalizations prior to admission? Yes  Has the patient had major surgery during 100 days prior to admission? Yes  Current Medications   Current Facility-Administered Medications:  .  0.9 %  sodium chloride infusion, , Intravenous, PRN, Jane Bookbinder, Jane Brooks, Stopped at 05/09/18 2015 .  albuterol (PROVENTIL) (2.5 MG/3ML) 0.083% nebulizer solution 2.5 mg, 2.5 mg, Nebulization, Q2H PRN, Jane Bookbinder, Jane Brooks .  bisacodyl (DULCOLAX) suppository 10 mg, 10 mg, Rectal, Daily PRN, Jane Bookbinder, Jane Brooks .  Chlorhexidine Gluconate Cloth 2 % PADS 6 each, 6 each, Topical, Daily, Jane Bookbinder, Jane Brooks, 6 each at 05/15/18 1000 .  enoxaparin (LOVENOX) injection 40 mg, 40 mg, Subcutaneous, Q24H, Jane Bookbinder, Jane Brooks, 40 mg at 05/15/18 2132 .  feeding supplement (BOOST / RESOURCE BREEZE) liquid 1 Container, 1 Container, Oral, BID BM, Jane Klein, Jane Brooks, 1 Container at 05/16/18 0900 .  feeding supplement (ENSURE ENLIVE) (ENSURE ENLIVE) liquid 237 mL, 237 mL, Oral, BID BM, Jane Bookbinder, Jane Brooks, 237 mL at 05/15/18 0839 .  feeding supplement (OSMOLITE 1.5 CAL) liquid 1,000 mL, 1,000 mL, Per Tube, Continuous, Jane Bookbinder, Jane Brooks, Stopped at 05/13/18 0300 .  feeding supplement (PRO-STAT SUGAR FREE 64) liquid 30 mL, 30 mL, Per Tube, BID, Jane Bookbinder, Jane Brooks, 30 mL at 05/15/18 0837 .  free water 50 mL, 50 mL, Per Tube, Q8H, Jane Klein, Jane Brooks .  hydrALAZINE (APRESOLINE) injection 10 mg, 10 mg, Intravenous, Q2H PRN, Jane Bookbinder, Jane Brooks, 10 mg at 05/07/18 1340 .  ibuprofen (ADVIL) 100 MG/5ML suspension 400 mg, 400 mg, Oral, Q8H PRN, Jane Bookbinder, Jane Brooks, 400 mg at 05/12/18 2035 .  insulin aspart (novoLOG) injection 0-9 Units, 0-9 Units, Subcutaneous, Q4H, Jane Bookbinder, Jane Brooks, 1 Units at 05/13/18 0327 .  lipase/protease/amylase (CREON) capsule 36,000 Units, 36,000 Units, Oral, TID, Jane Bookbinder, Jane Brooks, 36,000 Units at 05/16/18 0900 .  MEDLINE mouth rinse, 15 mL, Mouth  Rinse, BID, Jane Bookbinder, Jane Brooks, 15 mL at 05/15/18 2133 .  methocarbamol (ROBAXIN) tablet 500 mg, 500 mg, Oral, Q6H PRN **OR** [DISCONTINUED] methocarbamol (ROBAXIN) 500 mg in dextrose 5 % 50 mL IVPB, 500 mg, Intravenous, Q6H, Jane Bookbinder, Jane Brooks, Stopped at 05/11/18 [phone] .  morphine (MSIR) tablet 15 mg, 15 mg, Oral, Q4H, Jane Klein, Jane Brooks, 15 mg at 05/16/18 0900 .  morphine 4 MG/ML injection 4 mg, 4 mg, Intravenous, Q2H PRN, Jane Klein, Jane Brooks, 4 mg  at 05/16/18 1220 .  ondansetron (ZOFRAN-ODT) disintegrating tablet 4 mg, 4 mg, Oral, Q6H PRN **OR** ondansetron (ZOFRAN) injection 4 mg, 4 mg, Intravenous, Q6H PRN, Jane Bookbinder, Jane Brooks, 4 mg at 05/16/18 9381 .  pantoprazole (PROTONIX) EC tablet 40 mg, 40 mg, Oral, Daily, Jane Brooks, Jane Brooks, Jane Brooks, 40 mg at 05/16/18 0900 .  piperacillin-tazobactam (ZOSYN) IVPB 3.375 g, 3.375 g, Intravenous, Q8H, Jane Klein, Jane Brooks, Last Rate: 12.5 mL/hr at 05/16/18 0837, 3.375 g at 05/16/18 0837 .  potassium chloride SA (K-DUR) CR tablet 20 mEq, 20 mEq, Oral, BID, Jane Brooks, Jane Brooks, Jane Brooks, 20 mEq at 05/16/18 0900 .  prochlorperazine (COMPAZINE) tablet 10 mg, 10 mg, Oral, Q6H PRN **OR** prochlorperazine (COMPAZINE) injection 5-10 mg, 5-10 mg, Intravenous, Q6H PRN, Jane Bookbinder, Jane Brooks, 10 mg at 05/13/18 2115 .  sodium chloride flush (NS) 0.9 % injection 10-40 mL, 10-40 mL, Intracatheter, Q12H, Jane Bookbinder, Jane Brooks, 10 mL at 05/16/18 0900 .  vancomycin (VANCOCIN) IVPB 750 mg/150 ml premix, 750 mg, Intravenous, Q12H, Jane Klein, Jane Brooks, Last Rate: 150 mL/hr at 05/16/18 0841, 750 mg at 05/16/18 0841  Patients Current Diet:     Diet Order                  Diet - low sodium heart healthy         Diet regular Room service appropriate? Yes; Fluid consistency: Thin  Diet effective now               Precautions / Restrictions Precautions Precautions: Fall, Other (comment) Precaution Comments: pt with freq episodes of loose stool Restrictions  Weight Bearing Restrictions: No   Has the patient had 2 or more falls or a fall with injury in the past year?No  Prior Activity Level Community (5-7x/wk): Independent, active, drove, not working; disabled  Prior Functional Level Prior Function Level of Independence: Independent Comments: worked last year; not for several months  Self Care: Did the patient need help bathing, dressing, using the toilet or eating?  Independent  Indoor Mobility: Did the patient need assistance with walking from room to room (with or without device)? Independent  Stairs: Did the patient need assistance with internal or external stairs (with or without device)? Independent  Functional Cognition: Did the patient need help planning regular tasks such as shopping or remembering to take medications? Independent  Home Assistive Devices / Equipment Home Equipment: None  Prior Device Use: Indicate devices/aids used by the patient prior to current illness, exacerbation or injury? None of the above  Current Functional Level Cognition  Overall Cognitive Status: Within Functional Limits for tasks assessed Current Attention Level: Selective Orientation Level: Oriented X4 Following Commands: Follows one step commands inconsistently, Follows one step commands with increased time Safety/Judgement: Decreased awareness of safety, Decreased awareness of deficits General Comments: Pt with in functional limits for tasks during PT session.      Extremity Assessment (includes Sensation/Coordination)  Upper Extremity Assessment: Generalized weakness RUE Deficits / Details: Weak grasp and gross strength. Poor coorindation and difficulty opening objects. Undershooting with reach during finger-to-nose and ADLs. RUE Coordination: decreased gross motor, decreased fine motor LUE Deficits / Details: Weak grasp and gross strength. Poor coorindation and difficulty opening objects. Undershooting with reach during  finger-to-nose and ADLs. LUE Coordination: decreased gross motor, decreased fine motor  Lower Extremity Assessment: Generalized weakness    ADLs  Overall ADL's : Needs assistance/impaired Eating/Feeding: NPO Grooming: Oral care, Wash/dry hands, Min guard, Standing Grooming Details (indicate cue type and reason): min guard for  safety  Upper Body Bathing: Minimal assistance, Sitting Lower Body Bathing: Minimal assistance, Sit to/from stand Lower Body Bathing Details (indicate cue type and reason): min assist for safety when bathing peri area  Upper Body Dressing : Set up, Sitting Upper Body Dressing Details (indicate cue type and reason): setup to don new gown  Lower Body Dressing: Sit to/from stand, Minimal assistance Lower Body Dressing Details (indicate cue type and reason): min assist for safety and balance while donning/doffing new mesh underwear  Toilet Transfer: Min guard, Ambulation, RW Toilet Transfer Details (indicate cue type and reason): for safety and balance Functional mobility during ADLs: Min guard, Minimal assistance, Rolling walker General ADL Comments: pt highly motivated, continues to be limited by decreased strength/endurance and balance (as well as cognition)     Mobility  Overal bed mobility: Needs Assistance Bed Mobility: Supine to Sit, Sit to Supine Supine to sit: Supervision Sit to supine: Supervision General bed mobility comments: No assistance needed.  Supervisiong for safety and line/lead management.      Transfers  Overall transfer level: Needs assistance Equipment used: Rolling walker (2 wheeled) Transfers: Sit to/from Stand Sit to Stand: Min guard General transfer comment: Min guard for safety no overt LOB.      Ambulation / Gait / Stairs / Wheelchair Mobility  Ambulation/Gait Ambulation/Gait assistance: Herbalist (Feet): 200 Feet(x2 1st trial with RW and 2nd trial without) Assistive device: Rolling walker (2 wheeled), None  Gait Pattern/deviations: Step-through pattern, Decreased stride length, Narrow base of support General Gait Details: Pt progressing well performed gt training with RW with supervision and good use of RW.  Removed device to trial gt without and required min assistance intially with cues to increase stride and reciprocal armswing.  Slow and guarded without RW but no LOB.   Gait velocity: dec Gait velocity interpretation: <1.31 ft/sec, indicative of household ambulator    Posture / Balance Dynamic Sitting Balance Sitting balance - Comments: Able to sit at EOb with close Min Guard A for safety Balance Overall balance assessment: Needs assistance Sitting-balance support: No upper extremity supported, Feet supported, Single extremity supported Sitting balance-Leahy Scale: Normal Sitting balance - Comments: Able to sit at EOb with close Min Guard A for safety Standing balance support: During functional activity, No upper extremity supported Standing balance-Leahy Scale: Fair Standing balance comment: able to static stand without UE support.      Special needs/care consideration BiPAP/CPAP n/a CPM n/a Continuous Drip IV n/a porto cath implanted 10/19 Dialysis n/a Life Vest n/a Oxygen n/a Special Bed n/a Trach Size n/a Wound Vac n/a Skin surgical incision; JP french drain placed 4/10 LUQ; Jejunostomy tube placed 04/26/2018 Bowel mgmt: LBM 4/29 continent Bladder mgmt: continent Diabetic mgmt n/a Behavioral consideration  N/a Chemo/radiation yes completed 8 rounds prior to surgery/admission   Previous Home Environment  Living Arrangements: Alone  Lives With: Youth worker, ALvin) Available Help at Discharge: Friend(s), Available PRN/intermittently Type of Home: Apartment Home Layout: One level Home Access: Level entry Bathroom Shower/Tub: Tub/shower unit, Architectural technologist: Standard Bathroom Accessibility: Yes How Accessible: Accessible via walker [place]: No  Additional Comments: with her dog  Roselee Nova, has her dog, Development worker, international aid  Discharge Living Setting Plans for Discharge Living Setting: Patient's home, Alone, Apartment Type of Home at Discharge: Apartment Discharge Home Layout: One level Discharge Home Access: Level entry Discharge Bathroom Shower/Tub: Tub/shower unit, Curtain Discharge Bathroom Toilet: Standard Discharge Bathroom Accessibility: Yes How Accessible: Accessible via walker Does the patient have any  problems obtaining your medications?: No  Social/Family/Support Systems Contact Information: sister, Lenna Sciara, and friend, [place] Anticipated Caregiver: friends  Anticipated Ambulance person Information: see above Ability/Limitations of Caregiver: intermittent only Caregiver Availability: Intermittent Discharge Plan Discussed with Primary Caregiver: No Is Caregiver In Agreement with Plan?: No Does Caregiver/Family have Issues with Lodging/Transportation while Pt is in Rehab?: No   Patient ws living with her Aunt until 01/2018. Edwyna Shell with Cancer center, [employer] [facility] and congregational nursing involved for she was asked by Aunt to find another place to live. [place] [employer] and SWs assisted her to her apartment in January where she now has stable housing. Patient has car, disability and Medicaid as of January 2020. No more chemo planned at this time.  Goals/Additional Needs Patient/Family Goal for Rehab: Mod I with PT, OT, and SLP Expected length of stay: ELOS 7 to 9 days Dietary Needs: J tube; needs nutritional follow up Pt/Family Agrees to Admission and willing to participate: Yes Program Orientation Provided & Reviewed with Pt/Caregiver Including Roles  & Responsibilities: Yes  Barriers to Discharge: Decreased caregiver support  Decrease burden of Care through IP rehab admission: n/a  Possible need for SNF placement upon discharge: not anticipated  Patient Condition: This patient's medical and  functional status has changed since the consult dated: 05/13/2018 in which the Rehabilitation Physician determined and documented that the patient's condition is appropriate for intensive rehabilitative care in an inpatient rehabilitation facility. See "History of Present Illness" (above) for medical update. Functional changes are: overall min assist. Patient's medical and functional status update has been discussed with the Rehabilitation physician and patient remains appropriate for inpatient rehabilitation. Will admit to inpatient rehab today.  Preadmission Screen Completed By:  Jane Brooks, Jane Brooks, 05/16/2018 12:56 PM ______________________________________________________________________   Discussed status with Dr. Letta Pate on 05/16/2018 at  1300 and received approval for admission today.  Admission Coordinator:  Jane Brooks time 2010 Date 05/16/2018         Cosigned by: Charlett Blake, Jane Brooks at 05/16/2018 1:05 PM  Revision History

## 2018-05-16 NOTE — Progress Notes (Signed)
Inpatient Rehabilitation Admissions Coordinator  I met with patient at bedside as well as Dr. Barry Dienes. Inpt rehab bed is available and pt in agreement to admit. I have notified RN CM, Almyra Free and will arrange admit today.  Danne Baxter, RN, MSN Rehab Admissions Coordinator [phone] 05/16/2018 12:22 PM

## 2018-05-16 NOTE — Progress Notes (Signed)
Meredith Staggers, MD  Physician  Physical Medicine and Rehabilitation  Consult Note  Signed  Date of Service:  05/13/2018 8:56 AM       Related encounter: Admission (Discharged) from 04/25/2018 in [facility] All Collapse All    Show:Clear all [x] Manual[x] Template[] Copied  Added by: [x] Love, Ivan Anchors, PA-C[x] Meredith Staggers, MD  [] Hover for details      Physical Medicine and Rehabilitation Consult   Reason for Consult: Debility Referring Physician: Dr. Barry Dienes    HPI: Jane Brooks is a 48 y.o. 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 She was admitted on 04/25/18 for Whipple procedure with portal vein reconstruction, placement of pancreatic stent and jejunostomy feeding tube by Dr. Barry Dienes. Post op course complicated by hypotension with AKI, shocked liver,   tachycardia and shock due to SIRS. She tolerated extubation on 4/10 and AKI resolved with hydration. On 4/12, she started developing increase WOB and was started on cefepime for RUL HCAP. She required reintubation 4/15 due to persistent fevers, poor pain control and anxiety. Antibiotics broadened and she was started on enteral morphine. Thrombocytopenia treated with FFP and MRI abdomen showed right hepatic necrosis. CT head negative and hepatic encephalopathy resolving with addition of lactulose. She tolerated extubation to [place] on 4/25 and mentation improving and confusion resolving. She has been weaned off pressors and renal status back to baseline. Hospital course significant for anemia of chronic disease which is being monitored, electrolyte abnormalities, diarrhea, urinary retention, tachycardia, low grade fevers, hyperglycemia--at risk for diabetes and dysphagia. Tube feeds d/c due to diarrhea?and currently on full liquid  diet. Therapy evaluations completed yesterday and CIR recommended due to functional decline. .    Review of Systems  Constitutional: Negative for chills and fever.  HENT: Negative for hearing loss and tinnitus.   Eyes: Negative for blurred vision and double vision.  Respiratory: Negative for cough, shortness of breath and wheezing.   Cardiovascular: Negative for chest pain and palpitations.  Gastrointestinal: Positive for abdominal pain and diarrhea. Negative for nausea and vomiting.  Genitourinary: Negative for dysuria, frequency and urgency.  Musculoskeletal: Negative for back pain, myalgias and neck pain.  Skin: Negative for rash.  Neurological: Negative for dizziness, sensory change and headaches.  Psychiatric/Behavioral: Positive for memory loss. The patient does not have insomnia.           Past Medical History:  Diagnosis Date   Family history of lung cancer    Family history of prostate cancer    Family history of uterine cancer    Gallstones 10/2017   Pancreatic cancer ([place])    Pneumothorax, closed, traumatic    years ago   PONV (postoperative nausea and vomiting)          Past Surgical History:  Procedure Laterality Date   BILIARY STENT PLACEMENT  10/31/2017   Procedure: BILIARY STENT PLACEMENT;  Surgeon: [name], MD;  Location: [facility];  Service: Gastroenterology;;   ERCP N/A 10/31/2017   Procedure: ENDOSCOPIC RETROGRADE CHOLANGIOPANCREATOGRAPHY (ERCP);  Surgeon: [name], MD;  Location: [facility] ENDOSCOPY;  Service: Gastroenterology;  Laterality: N/A;   ESOPHAGOGASTRODUODENOSCOPY N/A 11/08/2017   Procedure: ESOPHAGOGASTRODUODENOSCOPY (EGD);  Surgeon: Milus Banister, MD;  Location: [facility] ENDOSCOPY;  Service: Endoscopy;  Laterality: N/A;   EUS N/A 11/08/2017   Procedure: UPPER ENDOSCOPIC  ULTRASOUND (EUS) RADIAL;  Surgeon: Milus Banister, MD;  Location: [facility] ENDOSCOPY;  Service: Endoscopy;  Laterality: N/A;   FINE NEEDLE  ASPIRATION  11/08/2017   Procedure: FINE NEEDLE ASPIRATION;  Surgeon: Milus Banister, MD;  Location: [facility] ENDOSCOPY;  Service: Endoscopy;;   IR IMAGING GUIDED PORT INSERTION  11/15/2017   LAPAROSCOPY N/A 04/25/2018   Procedure: LAPAROSCOPY DIAGNOSTIC;  Surgeon: Stark Klein, MD;  Location: [facility];  Service: General;  Laterality: N/A;  GENERAL AND EPIDURAL ANESTHESIA   SPHINCTEROTOMY  10/31/2017   Procedure: SPHINCTEROTOMY;  Surgeon: [name], MD;  Location: [facility];  Service: Gastroenterology;;   TONSILLECTOMY     WHIPPLE PROCEDURE N/A 04/25/2018   Procedure: WHIPPLE PROCEDURE, RECONSTRUCTION OF PORTAL VEIN;  Surgeon: Stark Klein, MD;  Location: [facility];  Service: General;  Laterality: N/A;         Family History  Problem Relation Age of Onset   Diabetes Mother    Hypertension Mother    COPD Mother    Uterine cancer Mother 64       had hysterectomy   Diabetes Sister    Hypertension Sister    Lung cancer Maternal Grandmother        lung cancer   Hypertension Sister     Social History:  Lives alone. Works for a [employer] from home. She reports that she has been smoking cigarettes--1 PPD. She has a 37.00 pack-year smoking history. She has never used smokeless tobacco. She reports previous alcohol use. She reports that she does not use drugs.       Allergies  Allergen Reactions   Latex Hives         Medications Prior to Admission  Medication Sig Dispense Refill   cyclobenzaprine (FLEXERIL) 5 MG tablet Take 1 tablet (5 mg total) by mouth 3 (three) times daily as needed for muscle spasms. 30 tablet 2   lidocaine-prilocaine (EMLA) cream Apply 1 application topically as needed. 30 g 2   morphine (MS CONTIN) 15 MG 12 hr tablet Take 1 tablet (15 mg total) by mouth every 12 (twelve) hours. 60 tablet 0   morphine (MSIR) 15 MG tablet Take 1 tablet (15 mg total) by mouth every 6 (six) hours as needed for severe pain. 120  tablet 0   ondansetron (ZOFRAN) 8 MG tablet Take 1 tablet (8 mg total) by mouth every 8 (eight) hours as needed for nausea or vomiting. 30 tablet 0   prochlorperazine (COMPAZINE) 10 MG tablet Take 1 tablet (10 mg total) by mouth every 6 (six) hours as needed for nausea or vomiting. 30 tablet 3   promethazine (PHENERGAN) 25 MG tablet Take 1 tablet (25 mg total) by mouth every 6 (six) hours as needed for nausea or vomiting. 30 tablet 0   DULoxetine (CYMBALTA) 20 MG capsule Take 1 capsule (20 mg total) by mouth 2 (two) times daily. Take once daily for 1 week then increase to BID (Patient not taking: Reported on 04/08/2018) 60 capsule 3   loperamide (IMODIUM) 2 MG capsule Take 2 capsules (4 mg total) by mouth every 6 (six) hours as needed for diarrhea or loose stools. (Patient not taking: Reported on 04/08/2018) 60 capsule 0   loratadine (CLARITIN) 10 MG tablet Take 1 tablet (10 mg total) by mouth daily. Take for 5 days after the Udenyca injection (Patient not taking: Reported on 04/08/2018) 30 tablet 2    Home: [place] expects to be discharged to:: Private residence Living Arrangements: Alone Available Help  at Discharge: Family, Available 24 hours/day Type of Home: Apartment(First) Home Access: Level entry Bathroom Shower/Tub: Chiropodist: Standard Home Equipment: None Additional Comments: with her dog  Functional History: Prior Function Level of Independence: Independent Comments: Worked at a factory in [place]:  Mobility: Bed Mobility Overal bed mobility: Needs Assistance Bed Mobility: Supine to Sit Supine to sit: Min assist, HOB elevated General bed mobility comments: pt sitting up in chair upon PT arrival Transfers Overall transfer level: Needs assistance Equipment used: Rolling walker (2 wheeled) Transfers: Sit to/from Stand Sit to Stand: Min assist, +2 safety/equipment General transfer comment: max directional  verbal cues for hand placement and safety, minA to power up, pt given RW which instantly assisted with posterior lean bringing her forward Ambulation/Gait Ambulation/Gait assistance: Min assist, +2 safety/equipment Gait Distance (Feet): 150 Feet Assistive device: Rolling walker (2 wheeled) Gait Pattern/deviations: Step-through pattern, Decreased stride length, Narrow base of support General Gait Details: slow and guarded, minA for walker management, RN assisted with lines, pt then had an episode of stool incontinence during ambulation, pt able to stand statically with bilat UEs on walker x 5 min for hygiene and changing of socks Gait velocity: dec Gait velocity interpretation: <1.31 ft/sec, indicative of household ambulator  ADL: ADL Overall ADL's : Needs assistance/impaired Eating/Feeding: NPO Grooming: Minimal assistance, Oral care, Sitting Grooming Details (indicate cue type and reason): Pt requiring assistance opening tooth brush container due to poor grasp strength and FM skills. Pt able to brush her teeth with increased time. When spitting into spit cup, pt presenting with poor problem solving and just spit on herself without grabbing cup Upper Body Bathing: Minimal assistance, Sitting Lower Body Bathing: Moderate assistance, Sit to/from stand Upper Body Dressing : Minimal assistance, Sitting Lower Body Dressing: Moderate assistance, Sit to/from stand Toilet Transfer: Moderate assistance, Ambulation, RW(Simulated to recliner) Functional mobility during ADLs: Moderate assistance, Rolling walker General ADL Comments: Pt presenting with decreased strength, balance, coorindation, cognition, and safety. Pt highly motivated to participate in therapy  Cognition: Cognition Overall Cognitive Status: Impaired/Different from baseline Orientation Level: Oriented to person, Oriented to place, Oriented to situation, Disoriented to time Cognition Arousal/Alertness: Awake/alert Behavior During  Therapy: Impulsive Overall Cognitive Status: Impaired/Different from baseline Area of Impairment: Attention, Memory, Following commands, Safety/judgement, Awareness, Orientation, Problem solving Orientation Level: Disoriented to, Time, Situation(pt thinks she is in [place]) Current Attention Level: Sustained Memory: Decreased short-term memory Following Commands: Follows one step commands inconsistently, Follows one step commands with increased time Safety/Judgement: Decreased awareness of safety, Decreased awareness of deficits Awareness: Emergent Problem Solving: Slow processing, Difficulty sequencing, Requires verbal cues General Comments: pt stating her sister lives right down the street and they live in [place], but then stated her sister lives in [place] and thats why she's here, then when re-oriented to [place], pt shocked but then stated she lived in [place] but doesn't know how she got here and her sister lives in [place]  Blood pressure 129/77, pulse 97, temperature 98.4 F (36.9 C), temperature source Oral, resp. rate (!) 30, height 4' 11"  (1.499 m), weight 61.7 kg, SpO2 98 %. Physical Exam  Nursing note and vitals reviewed. Constitutional: She is oriented to person, place, and time. She appears well-developed. No distress.  HENT:  Head: Normocephalic.  Eyes: Pupils are equal, round, and reactive to light.  Neck: Normal range of motion.  Cardiovascular: Normal rate.  Respiratory: Effort normal.  GI: She exhibits distension. Bowel sounds are decreased. There is abdominal  tenderness.  Mid line incision C/D/I.   Musculoskeletal: Normal range of motion.  Neurological: She is alert and oriented to person, place, and time. No cranial nerve deficit. Coordination normal.  Dysphonia noted. Question mild disorientation. She was able to follow simple motor commands without difficulty. UE 4/5. LE 3+/5 HF, KE and 4/5 ADF/PF.   Skin: Skin is warm. She is not  diaphoretic.  Psychiatric: She has a normal mood and affect. Her behavior is normal.    LabResultsLast24Hours       Results for orders placed or performed during the hospital encounter of 04/25/18 (from the past 24 hour(s))  Glucose, capillary     Status: Abnormal   Collection Time: 05/12/18 11:39 AM  Result Value Ref Range   Glucose-Capillary 141 (H) 70 - 99 mg/dL   Comment 1 Notify RN    Comment 2 Document in Chart   Glucose, capillary     Status: Abnormal   Collection Time: 05/12/18  3:27 PM  Result Value Ref Range   Glucose-Capillary 152 (H) 70 - 99 mg/dL   Comment 1 Notify RN    Comment 2 Document in Chart   Glucose, capillary     Status: Abnormal   Collection Time: 05/12/18  7:25 PM  Result Value Ref Range   Glucose-Capillary 124 (H) 70 - 99 mg/dL  Glucose, capillary     Status: Abnormal   Collection Time: 05/12/18 11:30 PM  Result Value Ref Range   Glucose-Capillary 153 (H) 70 - 99 mg/dL  Glucose, capillary     Status: Abnormal   Collection Time: 05/13/18  3:11 AM  Result Value Ref Range   Glucose-Capillary 147 (H) 70 - 99 mg/dL  CBC     Status: Abnormal   Collection Time: 05/13/18  5:45 AM  Result Value Ref Range   WBC 8.4 4.0 - 10.5 K/uL   RBC 2.94 (L) 3.87 - 5.11 MIL/uL   Hemoglobin 7.2 (L) 12.0 - 15.0 g/dL   HCT 22.9 (L) 36.0 - 46.0 %   MCV 77.9 (L) 80.0 - 100.0 fL   MCH 24.5 (L) 26.0 - 34.0 pg   MCHC 31.4 30.0 - 36.0 g/dL   RDW Not Measured 11.5 - 15.5 %   Platelets 303 150 - 400 K/uL   nRBC 0.4 (H) 0.0 - 0.2 %  Basic metabolic panel     Status: Abnormal   Collection Time: 05/13/18  5:45 AM  Result Value Ref Range   Sodium 137 135 - 145 mmol/L   Potassium 3.8 3.5 - 5.1 mmol/L   Chloride 106 98 - 111 mmol/L   CO2 23 22 - 32 mmol/L   Glucose, Bld 101 (H) 70 - 99 mg/dL   BUN 7 6 - 20 mg/dL   Creatinine, Ser 0.64 0.44 - 1.00 mg/dL   Calcium 7.7 (L) 8.9 - 10.3 mg/dL   GFR calc non Af Amer >60 >60 mL/min   GFR  calc Af Amer >60 >60 mL/min   Anion gap 8 5 - 15  Ammonia     Status: None   Collection Time: 05/13/18  5:45 AM  Result Value Ref Range   Ammonia 29 9 - 35 umol/L  Glucose, capillary     Status: None   Collection Time: 05/13/18  7:39 AM  Result Value Ref Range   Glucose-Capillary 88 70 - 99 mg/dL   Comment 1 Notify RN    Comment 2 Document in Chart       ImagingResults(Last48hours)  Dg Chest  Port 1 View  Result Date: 05/12/2018 CLINICAL DATA:  Respiratory failure.  Pancreatic adenocarcinoma. EXAM: PORTABLE CHEST 1 VIEW COMPARISON:  05/11/2018. FINDINGS: Nasogastric tube has been removed. Endotracheal tube has been removed. Unchanged and normal cardiomediastinal silhouette. Port-A-Cath stable, tip proximal RIGHT atrium. Healed LEFT-sided rib fractures. BILATERAL diffuse lung opacities continue to show mild improvement. LEFT basilar subsegmental atelectasis is less prominent. No effusions. IMPRESSION: Slight improvement aeration. Electronically Signed   By: Staci Righter M.D.   On: 05/12/2018 07:28      Assessment/Plan: Diagnosis: 48 yo female with debility after Whipple procedure for pancreatic cancer 1. Does the need for close, 24 hr/day medical supervision in concert with the patient's rehab needs make it unreasonable for this patient to be served in a less intensive setting? Yes 2. Co-Morbidities requiring supervision/potential complications: wound care, thrombocytopenia, diarrhea, nutrition/dysphagia 3. Due to bladder management, bowel management, safety, skin/wound care, disease management, medication administration, pain management and patient education, does the patient require 24 hr/day rehab nursing? Yes 4. Does the patient require coordinated care of a physician, rehab nurse, PT (1-2 hrs/day, 5 days/week) and OT (1-2 hrs/day, 5 days/week) to address physical and functional deficits in the context of the above medical diagnosis(es)? Yes Addressing deficits in the  following areas: balance, endurance, locomotion, strength, transferring, bowel/bladder control, bathing, dressing, feeding, grooming, toileting and psychosocial support 5. Can the patient actively participate in an intensive therapy program of at least 3 hrs of therapy per day at least 5 days per week? Yes 6. The potential for patient to make measurable gains while on inpatient rehab is excellent 7. Anticipated functional outcomes upon discharge from inpatient rehab are modified independent  with PT, modified independent with OT, n/a with SLP. 8. Estimated rehab length of stay to reach the above functional goals is: 7-10 days 9. Anticipated D/C setting: Home 10. Anticipated post D/C treatments: [place] therapy 11. Overall Rehab/Functional Prognosis: excellent  RECOMMENDATIONS: This patient's condition is appropriate for continued rehabilitative care in the following setting: CIR Patient has agreed to participate in recommended program. Yes Note that insurance prior authorization may be required for reimbursement for recommended care.  Comment: Pt lives alone. Extremely motivated. Rehab Admissions Coordinator to follow up.  Thanks,  Meredith Staggers, MD, Mellody Drown  I have personally performed a face to face diagnostic evaluation of this patient. Additionally, I have examined pertinent labs and radiographic images. I have reviewed and concur with the physician assistant's documentation above.    Bary Leriche, PA-C 05/13/2018        Revision History                             Routing History

## 2018-05-16 NOTE — Progress Notes (Signed)
Patient ID: Jane Brooks, female   DOB: 1970/11/15, 48 y.o.   MRN: [id number] Pt arrived to unit, alert and oriented, able to make needs known

## 2018-05-16 NOTE — Discharge Summary (Signed)
Physician Discharge Summary  Patient ID: Jane Brooks MRN: [id number] DOB/AGE: February 10, 1970 48 y.o.  Admit date: 04/25/2018 Discharge date: 05/16/2018  Admission Diagnoses:  Pancreatic cancer HTN Opiate use (not abuse)  Discharge Diagnoses:  Principal Problem:   Acute respiratory failure with hypoxemia (HCC) Active Problems:   Pancreatic cancer ([place])   Adenocarcinoma of head of pancreas ([place])   HCAP (healthcare-associated pneumonia)   Essential hypertension   Chronic prescription opiate use   Postoperative pain   Tachycardia   Tachypnea   Acute respiratory alkalosis Moderate to severe protein calorie malnutrition Post op partial right liver necrosis  Discharged Condition: stable  Hospital Course:  Patient was admitted to the unit on 04/25/2018 following a diagnostic laparoscopy and pancreaticoduodenectomy requiring portal vein reconstruction.  She was taken to the ICU intubated due to the length of the case, fluid needs and blood loss.  She was quite acidotic and tachycardic for several days.  Because of the tachycardia and acidosis, she was sent for a chest CT for PE and a head CT on the day of surgery.  This was negative, but she did develop significant acute kidney injury with creatinine up to around 3-4.  I had she had an epidural in place which had to be turned on and off due to blood pressure.  She required levophed for several days.  Because of the amount of bowel edema she had, tube feeds were not started for several days postop.  She self extubated on postop day 3-4 and stayed extubated for around 2 to 3 days.  Unfortunately, she developed pneumonia and became quite tachypneic.  She was reintubated for work of breathing and hypoxia.  She developed fevers.  She was started on antibiotics to cover pneumonia.  These were broadened when she remained febrile.  Empiric coverage was started for potential pancreatic leak.  Drains were sent for lipase which was low which indicated no  leak.  CT scan was performed which showed multifocal pneumonia in the chest, no evidence of pulmonary embolus, no evidence of intra-abdominal fluid collection, but there was a potential fluid collection in her liver.  MRI was performed of the liver which showed this to be more consistent with necrosis rather than fluid collection.  She received also a short course of yeast coverage, but this was stopped after it was demonstrated that she did not have a pancreatic leak.  430 marks a 2-week course of antibiotics and so these will be stopped today.  Throughout her period of time when she was intubated, she tolerated tube feeds at goal.  She has been stooling and passing gas.  If she is not using her feeding tube, it should still be flushed with water 3 times a day to maintain patency.  She was on pressure support trials for multiple days, but continued to have issues waking up.  Her liver function test were down to nearly normal, but an ammonia was sent and this was high.  She was started on lactulose which has brought this down to normal.  We have progressively been decreasing her lactulose dose and she has remained with a normal ammonia level.  I will stop her lactulose prior to discharge.  She was able to extubate once her mental status improved on the lactulose.  Once extubated, she comments that she does not like the tube feeds as they make her have significant diarrhea and bloating.  These were stopped and she is given a trial of oral intake.  She did taken around  80% of her calories orally.  She had a few episodes of vomiting, but associated those with taking medications.  Her medication regimen is simplified.  She has no evidence of constipation so bowel regimen was halted.  Of note, she was on MS Contin preoperatively.  This was converted to immediate release morphine in the hospital.  She did require an increase of every 6 to every 4 to have adequate pain control.  She is doing quite well with this  regimen.  Because of the amount of pancreas removed, she was kept on fingerstick glucoses.  Her fingersticks for the past 4 to 5 days have remained low and she has not required insulin.  These are discontinued.  She did have significant hypokalemia with her diuresis.  We have been able to decrease the amount of potassium that she is requiring.  Originally, her drains were diuresing her significantly.  These were left in place initially due to assisting with her fluid status.  1 of these drains was removed.  The second drain did develop a slightly cloudy appearance despite having a normal lipase.  This has been left in place and continues to decrease in volume.  It also is clearing up.  I am sending the lipase again from this drain prior to discharge.  She had urinary retention that responded to Urecholine.  This will be discontinued prior to discharge given her issues with nausea associated with taking many medications.    Main discharge issues:  One JP right abdomen.  Hopefully can d/c this early next week.  J tube: ok to use if patient tolerates for night feeds, LIQUID meds.  PO intake vs tube feeds/supplements - may have delayed gastric emptying and may need supplements via feeding tube.      Consults: pulmonary/intensive care, nephrology and rehabilitation medicine  CT abd 4/23 IMPRESSION: Status post Whipple's procedure. 1 surgical drain remains in the area. Pancreatic duct stent is noted.  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 It appears to be stable in size compared to prior exam.  Wall and fold thickening is seen involving right and transverse colon which may represent edema or possibly inflammation.  Aortic Atherosclerosis (ICD10-I70.0).  Significant Diagnostic Studies: labs: HCT 22.7 (stable) prior to d/c.  WBCs 4.1 prior to d/c.  Cr 0.75 prior to d/c.  Prealbumin 13.6.   LFTs down to normal.   and radiology:   Treatments: surgery: see above and intubation, blood products, tube feeds, ot/pt/st.    Discharge Exam: Blood pressure 128/78, pulse 88, temperature 98.7 F (37.1 C), temperature source Oral, resp. rate 18, height 4' 11"  (1.499 m), weight 66.4 kg, SpO2 100 %. General appearance: alert, cooperative and no distress Resp: breathing comfortably Cardio: regular rate and rhythm GI: soft, sl distended, non tender.  drain output serous and sl cloudy. Extremities: extremities normal, atraumatic, no cyanosis or edema  Disposition: Discharge disposition: [place] Not Defined       Discharge Instructions    Call MD for:  difficulty breathing, headache or visual disturbances   Complete by:  As directed    Call MD for:  persistant nausea and vomiting   Complete by:  As directed    Call MD for:  redness, tenderness, or signs of infection (pain, swelling, redness, odor or green/yellow discharge around incision site)   Complete by:  As directed  Call MD for:  severe uncontrolled pain   Complete by:  As directed    Call MD for:  temperature >100.4   Complete by:  As directed    Diet - low sodium heart healthy   Complete by:  As directed    Increase activity slowly   Complete by:  As directed      Allergies as of 05/16/2018      Reactions   Latex Hives      Medication List    TAKE these medications   albuterol (2.5 MG/3ML) 0.083% nebulizer solution Commonly known as:  PROVENTIL Take 3 mLs (2.5 mg total) by nebulization every 2 (two) hours as needed for wheezing or shortness of breath.   cyclobenzaprine 5 MG tablet Commonly known as:  FLEXERIL Take 1 tablet (5 mg total) by mouth 3 (three) times daily as needed for muscle spasms.   DULoxetine 20 MG capsule Commonly known as:  CYMBALTA Take 1 capsule (20 mg total) by mouth 2 (two) times daily. Take once daily for 1 week then increase to BID   free water Soln Place  50 mLs into feeding tube every 8 (eight) hours.   lidocaine-prilocaine cream Commonly known as:  EMLA Apply 1 application topically as needed.   lipase/protease/amylase 36000 UNITS Cpep capsule Commonly known as:  CREON Take 1 capsule (36,000 Units total) by mouth 3 (three) times daily.   loperamide 2 MG capsule Commonly known as:  IMODIUM Take 2 capsules (4 mg total) by mouth every 6 (six) hours as needed for diarrhea or loose stools.   loratadine 10 MG tablet Commonly known as:  Claritin Take 1 tablet (10 mg total) by mouth daily. Take for 5 days after the Udenyca injection   methocarbamol 500 MG tablet Commonly known as:  ROBAXIN Take 1 tablet (500 mg total) by mouth every 6 (six) hours as needed for muscle spasms.   morphine 15 MG tablet Commonly known as:  MSIR Take 1 tablet (15 mg total) by mouth every 4 (four) hours. What changed:    when to take this  reasons to take this  Another medication with the same name was removed. Continue taking this medication, and follow the directions you see here.   ondansetron 4 MG disintegrating tablet Commonly known as:  ZOFRAN-ODT Take 1 tablet (4 mg total) by mouth every 6 (six) hours as needed for nausea.   ondansetron 8 MG tablet Commonly known as:  ZOFRAN Take 1 tablet (8 mg total) by mouth every 8 (eight) hours as needed for nausea or vomiting.   pantoprazole 40 MG tablet Commonly known as:  PROTONIX Take 1 tablet (40 mg total) by mouth daily. Start taking on:  May 17, 2018   potassium chloride SA 20 MEQ tablet Commonly known as:  K-DUR Take 1 tablet (20 mEq total) by mouth 2 (two) times daily.   prochlorperazine 10 MG tablet Commonly known as:  COMPAZINE Take 1 tablet (10 mg total) by mouth every 6 (six) hours as needed for nausea or vomiting. What changed:  Another medication with the same name was added. Make sure you understand how and when to take each.   prochlorperazine 10 MG tablet Commonly known as:   COMPAZINE Take 1 tablet (10 mg total) by mouth every 6 (six) hours as needed for nausea or vomiting (Use for nausea and / or vomiting unresolved with ondansetron (Zofran).). What changed:  You were already taking a medication with the same name, and this prescription was  added. Make sure you understand how and when to take each.   promethazine 25 MG tablet Commonly known as:  PHENERGAN Take 1 tablet (25 mg total) by mouth every 6 (six) hours as needed for nausea or vomiting.      Follow-up Information    Stark Klein, MD Follow up in 2 day(s).   Specialty:  General Surgery Contact information: [address] [place] [address] [phone]           Signed: Stark Klein 05/16/2018, 11:49 AM

## 2018-05-16 NOTE — H&P (Signed)
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  She continued to have significant upper abdominal pain with nausea and weight loss therefore was admitted on 04/25/2018 for Whipple procedure with portal vein reconstruction, placement of pancreatic stent and jejunostomy feeding tube by Dr. Barry Dienes.  Postop course complicated by hypotension with AKI, shock liver, tachycardia as well as SIRS.  She tolerated extubation on 04/26/18 and AKI resolved with hydration.  On 04/28/2018,  she started developing increased WOB and was started on cefepime for RUL HCAP but continued to have fevers with poor pain control as well as elevated levels of anxiety requiring reintubation 05/01/18. Antibiotics coverage broadened to Vanc/Zosyn and she was started on enteral morphine for pain control.    CT abdomenpelvis showed multilobar pneumonia with 9 cm lobulated density in right hepatic lobe and thickening of right and proximal transverse colon concerning for colitis. MRI abdomen showed hepatic necrosis and she was treated with FFP for thrombocytopenia and hepatic encephalopathy resolving with addition of lactulose. She tolerated extubation by 4/25 and diet slowly advanced to regular. Follow up CT abdomen 4/23 shoed low density consistent with infarction of all of right hepatic lobe--stable and colonic edema or possible inflammation.   Hospital course significant for electrolyte abnormality, diarrhea, urinary retention, hyperglycemia and ongoing issue with abdominal pain. KUB 4/29 without ileus. ABLA. She completes 2 week course of  Vanc/Zosyn today. Foley d/c today and LLQ drain to stay in place till early next week due to ongoing cloudy drainage. Therapy ongoing and patient continues to have limitations in mobility and self care tasksa as well as high level cognitive  deficits. CIR recommended due to functional decline.      Review of Systems  Constitutional: Negative for chills and fever.  HENT: Negative for hearing loss and tinnitus.   Eyes: Negative for blurred vision and double vision.  Respiratory: Negative for cough and shortness of breath.   Cardiovascular: Negative for chest pain and leg swelling.  Gastrointestinal: Positive for nausea. Negative for constipation and heartburn.  Genitourinary: Negative for dysuria and urgency.  Musculoskeletal: Negative for back pain and myalgias.  Skin: Negative for itching and rash.  Neurological: Negative for dizziness and headaches.  Psychiatric/Behavioral: Negative for depression and suicidal ideas. The patient is not nervous/anxious.      Past Medical History:  Diagnosis Date  . Family history of lung cancer   . Family history of prostate cancer   . Family history of uterine cancer   . Gallstones 10/2017  . Pancreatic cancer ([place])   . Pneumothorax, closed, traumatic    years ago  . PONV (postoperative nausea and vomiting)     Past Surgical History:  Procedure Laterality Date  . BILIARY STENT PLACEMENT  10/31/2017   Procedure: BILIARY STENT PLACEMENT;  Surgeon: [name], MD;  Location: [facility] ENDOSCOPY;  Service: Gastroenterology;;  . ERCP N/A 10/31/2017   Procedure: ENDOSCOPIC RETROGRADE CHOLANGIOPANCREATOGRAPHY (ERCP);  Surgeon: [name], MD;  Location: [facility] ENDOSCOPY;  Service: Gastroenterology;  Laterality: N/A;  . ESOPHAGOGASTRODUODENOSCOPY N/A 11/08/2017   Procedure: ESOPHAGOGASTRODUODENOSCOPY (EGD);  Surgeon: Milus Banister, MD;  Location: [facility] ENDOSCOPY;  Service: Endoscopy;  Laterality: N/A;  . EUS N/A 11/08/2017   Procedure: UPPER ENDOSCOPIC ULTRASOUND (EUS) RADIAL;  Surgeon: Milus Banister, MD;  Location: [facility] ENDOSCOPY;  Service: Endoscopy;  Laterality: N/A;  . FINE  NEEDLE ASPIRATION  11/08/2017   Procedure: FINE NEEDLE ASPIRATION;  Surgeon: Milus Banister, MD;  Location: [facility]  ENDOSCOPY;  Service: Endoscopy;;  . IR IMAGING GUIDED PORT INSERTION  11/15/2017  . LAPAROSCOPY N/A 04/25/2018   Procedure: LAPAROSCOPY DIAGNOSTIC;  Surgeon: Stark Klein, MD;  Location: [facility];  Service: General;  Laterality: N/A;  GENERAL AND EPIDURAL ANESTHESIA  . SPHINCTEROTOMY  10/31/2017   Procedure: SPHINCTEROTOMY;  Surgeon: [name], MD;  Location: [facility] ENDOSCOPY;  Service: Gastroenterology;;  . TONSILLECTOMY    . WHIPPLE PROCEDURE N/A 04/25/2018   Procedure: WHIPPLE PROCEDURE, RECONSTRUCTION OF PORTAL VEIN;  Surgeon: Stark Klein, MD;  Location: [place];  Service: General;  Laterality: N/A;    Family History  Problem Relation Age of Onset  . Diabetes Mother   . Hypertension Mother   . COPD Mother   . Uterine cancer Mother 56       had hysterectomy  . Diabetes Sister   . Hypertension Sister   . Lung cancer Maternal Grandmother        lung cancer  . Hypertension Sister     Social History:  Lives alone. Independent PTA. reports that she has been smoking cigarettes. She has a 37.00 pack-year smoking history. She has never used smokeless tobacco. She reports previous alcohol use. She reports that she does not use drugs.    Allergies  Allergen Reactions  . Latex Hives    Medications Prior to Admission  Medication Sig Dispense Refill  . cyclobenzaprine (FLEXERIL) 5 MG tablet Take 1 tablet (5 mg total) by mouth 3 (three) times daily as needed for muscle spasms. 30 tablet 2  . lidocaine-prilocaine (EMLA) cream Apply 1 application topically as needed. 30 g 2  . morphine (MS CONTIN) 15 MG 12 hr tablet Take 1 tablet (15 mg total) by mouth every 12 (twelve) hours. 60 tablet 0  . morphine (MSIR) 15 MG tablet Take 1 tablet (15 mg total) by mouth every 6 (six) hours as needed for severe pain. 120 tablet 0  . ondansetron (ZOFRAN) 8 MG tablet Take 1 tablet (8 mg total) by mouth every 8 (eight) hours as needed for nausea or vomiting. 30 tablet 0  . prochlorperazine (COMPAZINE) 10 MG  tablet Take 1 tablet (10 mg total) by mouth every 6 (six) hours as needed for nausea or vomiting. 30 tablet 3  . promethazine (PHENERGAN) 25 MG tablet Take 1 tablet (25 mg total) by mouth every 6 (six) hours as needed for nausea or vomiting. 30 tablet 0  . DULoxetine (CYMBALTA) 20 MG capsule Take 1 capsule (20 mg total) by mouth 2 (two) times daily. Take once daily for 1 week then increase to BID (Patient not taking: Reported on 04/08/2018) 60 capsule 3  . loperamide (IMODIUM) 2 MG capsule Take 2 capsules (4 mg total) by mouth every 6 (six) hours as needed for diarrhea or loose stools. (Patient not taking: Reported on 04/08/2018) 60 capsule 0  . loratadine (CLARITIN) 10 MG tablet Take 1 tablet (10 mg total) by mouth daily. Take for 5 days after the Udenyca injection (Patient not taking: Reported on 04/08/2018) 30 tablet 2    Drug Regimen Review  Drug regimen was reviewed and remains appropriate with no significant issues identified  Home: Home Living Family/patient expects to be discharged to:: Private residence Living Arrangements: Alone Available Help at Discharge: Family, Available 24 hours/day Type of Home: Apartment(First) Home Access: Level entry Bathroom Shower/Tub: Chiropodist: Standard Home Equipment: None Additional  Comments: with her dog   Functional History: Prior Function Level of Independence: Independent Comments: Worked at a factory in [place]:  Mobility: Bed Mobility Overal bed mobility: Needs Assistance Bed Mobility: Supine to Sit, Sit to Supine Supine to sit: Supervision Sit to supine: Supervision General bed mobility comments: No assistance needed.  Supervisiong for safety and line/lead management.   Transfers Overall transfer level: Needs assistance Equipment used: Rolling walker (2 wheeled) Transfers: Sit to/from Stand Sit to Stand: Min guard General transfer comment: Min guard for safety no overt LOB.    Ambulation/Gait Ambulation/Gait assistance: Min assist Gait Distance (Feet): 200 Feet(x2 1st trial with RW and 2nd trial without) Assistive device: Rolling walker (2 wheeled), None Gait Pattern/deviations: Step-through pattern, Decreased stride length, Narrow base of support General Gait Details: Pt progressing well performed gt training with RW with supervision and good use of RW.  Removed device to trial gt without and required min assistance intially with cues to increase stride and reciprocal armswing.  Slow and guarded without RW but no LOB.   Gait velocity: dec Gait velocity interpretation: <1.31 ft/sec, indicative of household ambulator    ADL: ADL Overall ADL's : Needs assistance/impaired Eating/Feeding: NPO Grooming: Oral care, Wash/dry hands, Min guard, Standing Grooming Details (indicate cue type and reason): min guard for safety  Upper Body Bathing: Minimal assistance, Sitting Lower Body Bathing: Minimal assistance, Sit to/from stand Lower Body Bathing Details (indicate cue type and reason): min assist for safety when bathing peri area  Upper Body Dressing : Set up, Sitting Upper Body Dressing Details (indicate cue type and reason): setup to don new gown  Lower Body Dressing: Sit to/from stand, Minimal assistance Lower Body Dressing Details (indicate cue type and reason): min assist for safety and balance while donning/doffing new mesh underwear  Toilet Transfer: Min guard, Ambulation, RW Toilet Transfer Details (indicate cue type and reason): for safety and balance Functional mobility during ADLs: Min guard, Minimal assistance, Rolling walker General ADL Comments: pt highly motivated, continues to be limited by decreased strength/endurance and balance (as well as cognition)   Cognition: Cognition Overall Cognitive Status: Within Functional Limits for tasks assessed Orientation Level: Oriented X4 Cognition Arousal/Alertness: Awake/alert Behavior During Therapy: WFL for  tasks assessed/performed Overall Cognitive Status: Within Functional Limits for tasks assessed Area of Impairment: Problem solving, Awareness Orientation Level: Disoriented to, Time, Situation(pt thinks she is in [place]) Current Attention Level: Selective Memory: Decreased short-term memory Following Commands: Follows one step commands inconsistently, Follows one step commands with increased time Safety/Judgement: Decreased awareness of safety, Decreased awareness of deficits Awareness: Emergent Problem Solving: Slow processing, Difficulty sequencing, Requires verbal cues General Comments: Pt with in functional limits for tasks during PT session.     Blood pressure 128/78, pulse 88, temperature 98.7 F (37.1 C), temperature source Oral, resp. rate 18, height 4' 11"  (1.499 m), weight 66.4 kg, SpO2 100 %. Physical Exam  Nursing note and vitals reviewed. Constitutional: She appears well-developed and well-nourished.  Respiratory: No accessory muscle usage. No respiratory distress. She has wheezes in the right upper field, the right middle field and the right lower field.  GI: She exhibits no distension.  Midline incision C/D/I--diffusely tender to touch. Drain LLQ to with cloudy fluid.    General: No acute distress Mood and affect are appropriate Heart: Regular rate and rhythm no rubs murmurs or extra sounds Lungs: Clear to auscultation, breathing unlabored, no rales or wheezes Abdomen: Positive bowel sounds, distended, no epigastric tenderness, no redness  around the J-tube      extremities: No clubbing, cyanosis, or edema Skin: No evidence of breakdown, no evidence of rash Neurologic: Cranial nerves II through XII intact, motor strength is4/5 in bilateral deltoid, bicep, tricep, grip, hip flexor, knee extensors, ankle dorsiflexor and plantar flexor Sensory exam normal sensation to light touch and proprioception in bilateral upper and lower extremities  Musculoskeletal: Full range  of motion in all 4 extremities. No joint swelling  Results for orders placed or performed during the hospital encounter of 04/25/18 (from the past 48 hour(s))  Glucose, capillary     Status: None   Collection Time: 05/14/18  3:59 PM  Result Value Ref Range   Glucose-Capillary 99 70 - 99 mg/dL  Glucose, capillary     Status: None   Collection Time: 05/14/18  8:11 PM  Result Value Ref Range   Glucose-Capillary 90 70 - 99 mg/dL  Glucose, capillary     Status: None   Collection Time: 05/15/18 12:11 AM  Result Value Ref Range   Glucose-Capillary 97 70 - 99 mg/dL  Glucose, capillary     Status: None   Collection Time: 05/15/18  4:34 AM  Result Value Ref Range   Glucose-Capillary 86 70 - 99 mg/dL  Glucose, capillary     Status: None   Collection Time: 05/15/18  8:17 AM  Result Value Ref Range   Glucose-Capillary 85 70 - 99 mg/dL  Glucose, capillary     Status: None   Collection Time: 05/15/18 11:23 AM  Result Value Ref Range   Glucose-Capillary 94 70 - 99 mg/dL  Glucose, capillary     Status: None   Collection Time: 05/15/18  3:52 PM  Result Value Ref Range   Glucose-Capillary 86 70 - 99 mg/dL  Glucose, capillary     Status: None   Collection Time: 05/15/18  7:31 PM  Result Value Ref Range   Glucose-Capillary 83 70 - 99 mg/dL  Glucose, capillary     Status: None   Collection Time: 05/15/18 11:07 PM  Result Value Ref Range   Glucose-Capillary 84 70 - 99 mg/dL   Comment 1 Notify RN    Comment 2 Document in Chart   Glucose, capillary     Status: None   Collection Time: 05/16/18  3:40 AM  Result Value Ref Range   Glucose-Capillary 77 70 - 99 mg/dL  Ammonia     Status: None   Collection Time: 05/16/18  4:46 AM  Result Value Ref Range   Ammonia 20 9 - 35 umol/L    Comment: Performed at [facility], [place] [address], [place], [place] [address]  Prealbumin     Status: Abnormal   Collection Time: 05/16/18  4:48 AM  Result Value Ref Range   Prealbumin 13.6 (L) 18 - 38 mg/dL     Comment: Performed at [facility] [address], [place], [place] [address]  Comprehensive metabolic panel     Status: Abnormal   Collection Time: 05/16/18  4:48 AM  Result Value Ref Range   Sodium 134 (L) 135 - 145 mmol/L   Potassium 3.5 3.5 - 5.1 mmol/L   Chloride 102 98 - 111 mmol/L   CO2 25 22 - 32 mmol/L   Glucose, Bld 89 70 - 99 mg/dL   BUN <5 (L) 6 - 20 mg/dL   Creatinine, Ser 0.75 0.44 - 1.00 mg/dL   Calcium 8.1 (L) 8.9 - 10.3 mg/dL   Total Protein 6.3 (L) 6.5 -  8.1 g/dL   Albumin 2.2 (L) 3.5 - 5.0 g/dL   AST 32 15 - 41 U/L   ALT 22 0 - 44 U/L   Alkaline Phosphatase 76 38 - 126 U/L   Total Bilirubin 0.6 0.3 - 1.2 mg/dL   GFR calc non Af Amer >60 >60 mL/min   GFR calc Af Amer >60 >60 mL/min   Anion gap 7 5 - 15    Comment: Performed at [facility] [address], [place], [place] [address]  CBC     Status: Abnormal   Collection Time: 05/16/18  4:48 AM  Result Value Ref Range   WBC 4.1 4.0 - 10.5 K/uL   RBC 2.99 (L) 3.87 - 5.11 MIL/uL   Hemoglobin 7.3 (L) 12.0 - 15.0 g/dL    Comment: Reticulocyte Hemoglobin testing may be clinically indicated, consider ordering this additional test [id number]    HCT 22.7 (L) 36.0 - 46.0 %   MCV 75.9 (L) 80.0 - 100.0 fL   MCH 24.4 (L) 26.0 - 34.0 pg   MCHC 32.2 30.0 - 36.0 g/dL   RDW 22.5 (H) 11.5 - 15.5 %   Platelets 216 150 - 400 K/uL   nRBC 0.0 0.0 - 0.2 %    Comment: Performed at [facility], [place] [address] [place], [place] [address]  Glucose, capillary     Status: Abnormal   Collection Time: 05/16/18  7:55 AM  Result Value Ref Range   Glucose-Capillary 104 (H) 70 - 99 mg/dL   Dg Abd 1 View  Result Date: 05/15/2018 CLINICAL DATA:  Abdominal distention and vomiting 2 days. EXAM: ABDOMEN - 1 VIEW COMPARISON:  CT 05/09/2018 and KUB 05/01/2018 FINDINGS: Pancreatic duct stent unchanged. Surgical drains over the left upper quadrant and left mid abdomen unchanged. Bowel gas pattern is nonobstructive. No  free peritoneal air. Surgical suture line projects over the stomach. Remainder of the exam is unchanged. IMPRESSION: Nonobstructive bowel gas pattern. Postsurgical changes as described with several surgical drains as well as pancreatic duct stent unchanged. Electronically Signed   By: Marin Olp M.D.   On: 05/15/2018 13:36       Medical Problem List and Plan: 1.  Debility secondary to pancreatic cancer status post Whipple procedure 2.  Antithrombotics: -DVT/anticoagulation:  Pharmaceutical: Lovenox  -antiplatelet therapy: N/A 3. Pain Management: Now on MSIR every 4 hours for pain.  4. Mood: LCSW to follow for evaluation and support.   -antipsychotic agents: N/A 5. Neuropsych: This patient is capable of making decisions on his own behalf. 6. Skin/Wound Care: Routine pressure relief measures 7. Fluids/Electrolytes/Nutrition: Monitor I/O. Checks lytes in am.  8.  PNA/Hepatic necrosis: Leucocytosis has resolved. LFTs WNL. She completes antibiotic regimen 4/30 9.  ABLA: H/H stable and in 7 range. Avoid adding iron due to ongoing GI issues.  10. Stress induced hyperglycemia: At high risk to develop diabetes--will order Hgb A1C.  11. AKI: Renal status stable. Hyponatremia noted.  12. Tachycardia: Monitor HR tid--likely due to deconditioning.  13.  Malnutrition: Prealbumin-13.6 . She is attempting small meals. Continue supplements 3-4 times a day.  14. Hepatic encephalopathy: Mentation has cleared and ammonia levels down to 20  15. Urinary retention: Foley d/c--monitor voiding with PVR checks.  Continue urecholine tid.   Post Admission Physician Evaluation: 1. Functional deficits secondary  to ability resulting from  pancreatic cancer 2. Patient admitted to receive collaborative, interdisciplinary care between the physiatrist, rehab nursing staff, and therapy team. 3. Patient's level of  medical complexity and substantial therapy needs in context of that medical necessity cannot be provided at  a lesser intensity of care. 4. Patient has experienced substantial functional loss from his/her baseline.  Judging by the patient's diagnosis, physical exam, and functional history, the patient has potential for functional progress which will result in measurable gains while on inpatient rehab.  Patient has improved from a min assist level to a min guard level since the rehab consult performed on 4/27/202020 these gains will be of substantial and practical use upon discharge in facilitating mobility and self-care at the household level. 5. Physiatrist will provide 24 hour management of medical needs as well as oversight of the therapy plan/treatment and provide guidance as appropriate regarding the interaction of the two. 6. 24 hour rehab nursing will assist in the management of  bladder management, bowel management, safety, skin/wound care, disease management, medication administration, pain management and patient education  and help integrate therapy concepts, techniques,education, etc. 7. PT will assess and treat for:pre gait, gait training, endurance , safety, equipment, neuromuscular re education  .  Goals are: independent with assistive device. 8. OT will assess and treat for ADLs, Cognitive perceptual skills, Neuromuscular re education, safety, endurance, equipment  .  Goals are: independent with assistive device.  9. SLP will assess and treat for NA  .  Goals are: N/A. 10. Case Management and Social Worker will assess and treat for psychological issues and discharge planning. 11. Team conference will be held weekly to assess progress toward goals and to determine barriers to discharge. 12.  Patient will receive at least 3 hours of therapy per day at least 5 days per week. 13. ELOS and Prognosis: 7-9d good      "I have personally performed a face to face diagnostic evaluation of this patient.  Additionally, I have reviewed and concur with the physician assistant's documentation above." Charlett Blake M.D. [facility] FAAPM&R (Sports Med, Neuromuscular Med) Diplomate [place] Board of [place], [place]-C 05/16/2018

## 2018-05-16 NOTE — PMR Pre-admission (Signed)
PMR Admission Coordinator Pre-Admission Assessment  Patient: Jane Brooks is an 48 y.o., female MRN: [id number] DOB: [birth year] Height: 4' 11"  (149.9 cm) Weight: 66.4 kg              Insurance Information HMO:     PPO:      PCP:      IPA:      80/20:      OTHER:  PRIMARY: Medicaid [place] [employer]      Policy#: [id number]      Subscriber: pt Benefits:  Phone #: passport one online     Name: 05/16/2018 Eff. Date: active     [facility]  Medicaid Application Date:       Case Manager:  Disability Application Date:       Case Worker:   The "Data Collection Information Summary" for patients in Inpatient Rehabilitation Facilities with attached "Privacy Act [place] Records" was provided and verbally reviewed with: N/A  Emergency Contact Information Contact Information    Name Relation Home Work [place] Other   [phone]   Verne Grain Sister   [phone]   [place], [place]     Current Medical History  Patient Admitting Diagnosis: debility after Whipple procedure for pancreatic cancer  History of Present Illness:      Jane Brooks is a 48 y.o. 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 She was admitted on 04/25/18 for Whipple procedure with portal vein reconstruction, placement of pancreatic stent and jejunostomy feeding tube by Dr. Barry Dienes. Post op course complicated by hypotension with AKI, shocked liver,   tachycardia and shock due to SIRS. She tolerated extubation on 4/10 and AKI resolved with hydration. On 4/12, she started developing increase WOB and was started on cefepime for RUL HCAP. She required reintubation 4/15 due to persistent fevers, poor pain control and anxiety. Antibiotics broadened and she was started on enteral morphine. Thrombocytopenia treated with FFP and MRI abdomen showed  right hepatic necrosis. CT head negative and hepatic encephalopathy resolving with addition of lactulose. She tolerated extubation to [place] on 4/25 and mentation improving and confusion resolving. She has been weaned off pressors and renal status back to baseline. Hospital course significant for anemia of chronic disease which is being monitored, electrolyte abnormalities, diarrhea, urinary retention, tachycardia, low grade fevers, hyperglycemia--at risk for diabetes and dysphagia. Tube feeds d/c due to diarrhea?and patient upgraded  To regular diet with thin liquids. 48 hr calorie count in process. Receiving Ensure, and Boost. Dr. Barry Dienes encourages use of J tube as needed. One JP remains and surgeon to follow up next week for possible removal.   Past Medical History  Past Medical History:  Diagnosis Date  . Family history of lung cancer   . Family history of prostate cancer   . Family history of uterine cancer   . Gallstones 10/2017  . Pancreatic cancer ([place])   . Pneumothorax, closed, traumatic    years ago  . PONV (postoperative nausea and vomiting)     Family History  family history includes COPD in her mother; Diabetes in her mother and sister; Hypertension in her mother, sister, and sister; Lung cancer in her maternal grandmother; Uterine cancer (age of onset: 31) in her mother.  Prior Rehab/Hospitalizations:  Has the patient had prior rehab or hospitalizations prior to admission? Yes  Has the patient had major surgery during  100 days prior to admission? Yes  Current Medications   Current Facility-Administered Medications:  .  0.9 %  sodium chloride infusion, , Intravenous, PRN, Rolm Bookbinder, MD, Stopped at 05/09/18 2015 .  albuterol (PROVENTIL) (2.5 MG/3ML) 0.083% nebulizer solution 2.5 mg, 2.5 mg, Nebulization, Q2H PRN, Rolm Bookbinder, MD .  bisacodyl (DULCOLAX) suppository 10 mg, 10 mg, Rectal, Daily PRN, Rolm Bookbinder, MD .  Chlorhexidine Gluconate Cloth 2 % PADS 6  each, 6 each, Topical, Daily, Rolm Bookbinder, MD, 6 each at 05/15/18 1000 .  enoxaparin (LOVENOX) injection 40 mg, 40 mg, Subcutaneous, Q24H, Rolm Bookbinder, MD, 40 mg at 05/15/18 2132 .  feeding supplement (BOOST / RESOURCE BREEZE) liquid 1 Container, 1 Container, Oral, BID BM, Stark Klein, MD, 1 Container at 05/16/18 0900 .  feeding supplement (ENSURE ENLIVE) (ENSURE ENLIVE) liquid 237 mL, 237 mL, Oral, BID BM, Rolm Bookbinder, MD, 237 mL at 05/15/18 0839 .  feeding supplement (OSMOLITE 1.5 CAL) liquid 1,000 mL, 1,000 mL, Per Tube, Continuous, Rolm Bookbinder, MD, Stopped at 05/13/18 0300 .  feeding supplement (PRO-STAT SUGAR FREE 64) liquid 30 mL, 30 mL, Per Tube, BID, Rolm Bookbinder, MD, 30 mL at 05/15/18 0837 .  free water 50 mL, 50 mL, Per Tube, Q8H, Stark Klein, MD .  hydrALAZINE (APRESOLINE) injection 10 mg, 10 mg, Intravenous, Q2H PRN, Rolm Bookbinder, MD, 10 mg at 05/07/18 1340 .  ibuprofen (ADVIL) 100 MG/5ML suspension 400 mg, 400 mg, Oral, Q8H PRN, Rolm Bookbinder, MD, 400 mg at 05/12/18 2035 .  insulin aspart (novoLOG) injection 0-9 Units, 0-9 Units, Subcutaneous, Q4H, Rolm Bookbinder, MD, 1 Units at 05/13/18 0327 .  lipase/protease/amylase (CREON) capsule 36,000 Units, 36,000 Units, Oral, TID, Rolm Bookbinder, MD, 36,000 Units at 05/16/18 0900 .  MEDLINE mouth rinse, 15 mL, Mouth Rinse, BID, Rolm Bookbinder, MD, 15 mL at 05/15/18 2133 .  methocarbamol (ROBAXIN) tablet 500 mg, 500 mg, Oral, Q6H PRN **OR** [DISCONTINUED] methocarbamol (ROBAXIN) 500 mg in dextrose 5 % 50 mL IVPB, 500 mg, Intravenous, Q6H, Rolm Bookbinder, MD, Stopped at 05/11/18 [phone] .  morphine (MSIR) tablet 15 mg, 15 mg, Oral, Q4H, Stark Klein, MD, 15 mg at 05/16/18 0900 .  morphine 4 MG/ML injection 4 mg, 4 mg, Intravenous, Q2H PRN, Stark Klein, MD, 4 mg at 05/16/18 1220 .  ondansetron (ZOFRAN-ODT) disintegrating tablet 4 mg, 4 mg, Oral, Q6H PRN **OR** ondansetron (ZOFRAN)  injection 4 mg, 4 mg, Intravenous, Q6H PRN, Rolm Bookbinder, MD, 4 mg at 05/16/18 1191 .  pantoprazole (PROTONIX) EC tablet 40 mg, 40 mg, Oral, Daily, Hammons, Kimberly B, RPH, 40 mg at 05/16/18 0900 .  piperacillin-tazobactam (ZOSYN) IVPB 3.375 g, 3.375 g, Intravenous, Q8H, Stark Klein, MD, Last Rate: 12.5 mL/hr at 05/16/18 0837, 3.375 g at 05/16/18 0837 .  potassium chloride SA (K-DUR) CR tablet 20 mEq, 20 mEq, Oral, BID, Hammons, Kimberly B, RPH, 20 mEq at 05/16/18 0900 .  prochlorperazine (COMPAZINE) tablet 10 mg, 10 mg, Oral, Q6H PRN **OR** prochlorperazine (COMPAZINE) injection 5-10 mg, 5-10 mg, Intravenous, Q6H PRN, Rolm Bookbinder, MD, 10 mg at 05/13/18 2115 .  sodium chloride flush (NS) 0.9 % injection 10-40 mL, 10-40 mL, Intracatheter, Q12H, Rolm Bookbinder, MD, 10 mL at 05/16/18 0900 .  vancomycin (VANCOCIN) IVPB 750 mg/150 ml premix, 750 mg, Intravenous, Q12H, Stark Klein, MD, Last Rate: 150 mL/hr at 05/16/18 0841, 750 mg at 05/16/18 0841  Patients Current Diet:  Diet Order            Diet - low sodium heart  healthy        Diet regular Room service appropriate? Yes; Fluid consistency: Thin  Diet effective now              Precautions / Restrictions Precautions Precautions: Fall, Other (comment) Precaution Comments: pt with freq episodes of loose stool Restrictions Weight Bearing Restrictions: No   Has the patient had 2 or more falls or a fall with injury in the past year?No  Prior Activity Level Community (5-7x/wk): Independent, active, drove, not working; disabled  Prior Functional Level Prior Function Level of Independence: Independent Comments: worked last year; not for several months  Self Care: Did the patient need help bathing, dressing, using the toilet or eating?  Independent  Indoor Mobility: Did the patient need assistance with walking from room to room (with or without device)? Independent  Stairs: Did the patient need assistance with  internal or external stairs (with or without device)? Independent  Functional Cognition: Did the patient need help planning regular tasks such as shopping or remembering to take medications? Independent  Home Assistive Devices / Equipment Home Equipment: None  Prior Device Use: Indicate devices/aids used by the patient prior to current illness, exacerbation or injury? None of the above  Current Functional Level Cognition  Overall Cognitive Status: Within Functional Limits for tasks assessed Current Attention Level: Selective Orientation Level: Oriented X4 Following Commands: Follows one step commands inconsistently, Follows one step commands with increased time Safety/Judgement: Decreased awareness of safety, Decreased awareness of deficits General Comments: Pt with in functional limits for tasks during PT session.      Extremity Assessment (includes Sensation/Coordination)  Upper Extremity Assessment: Generalized weakness RUE Deficits / Details: Weak grasp and gross strength. Poor coorindation and difficulty opening objects. Undershooting with reach during finger-to-nose and ADLs. RUE Coordination: decreased gross motor, decreased fine motor LUE Deficits / Details: Weak grasp and gross strength. Poor coorindation and difficulty opening objects. Undershooting with reach during finger-to-nose and ADLs. LUE Coordination: decreased gross motor, decreased fine motor  Lower Extremity Assessment: Generalized weakness    ADLs  Overall ADL's : Needs assistance/impaired Eating/Feeding: NPO Grooming: Oral care, Wash/dry hands, Min guard, Standing Grooming Details (indicate cue type and reason): min guard for safety  Upper Body Bathing: Minimal assistance, Sitting Lower Body Bathing: Minimal assistance, Sit to/from stand Lower Body Bathing Details (indicate cue type and reason): min assist for safety when bathing peri area  Upper Body Dressing : Set up, Sitting Upper Body Dressing Details  (indicate cue type and reason): setup to don new gown  Lower Body Dressing: Sit to/from stand, Minimal assistance Lower Body Dressing Details (indicate cue type and reason): min assist for safety and balance while donning/doffing new mesh underwear  Toilet Transfer: Min guard, Ambulation, RW Toilet Transfer Details (indicate cue type and reason): for safety and balance Functional mobility during ADLs: Min guard, Minimal assistance, Rolling walker General ADL Comments: pt highly motivated, continues to be limited by decreased strength/endurance and balance (as well as cognition)     Mobility  Overal bed mobility: Needs Assistance Bed Mobility: Supine to Sit, Sit to Supine Supine to sit: Supervision Sit to supine: Supervision General bed mobility comments: No assistance needed.  Supervisiong for safety and line/lead management.      Transfers  Overall transfer level: Needs assistance Equipment used: Rolling walker (2 wheeled) Transfers: Sit to/from Stand Sit to Stand: Min guard General transfer comment: Min guard for safety no overt LOB.      Ambulation / Gait / Stairs /  Wheelchair Mobility  Ambulation/Gait Ambulation/Gait assistance: Herbalist (Feet): 200 Feet(x2 1st trial with RW and 2nd trial without) Assistive device: Rolling walker (2 wheeled), None Gait Pattern/deviations: Step-through pattern, Decreased stride length, Narrow base of support General Gait Details: Pt progressing well performed gt training with RW with supervision and good use of RW.  Removed device to trial gt without and required min assistance intially with cues to increase stride and reciprocal armswing.  Slow and guarded without RW but no LOB.   Gait velocity: dec Gait velocity interpretation: <1.31 ft/sec, indicative of household ambulator    Posture / Balance Dynamic Sitting Balance Sitting balance - Comments: Able to sit at EOb with close Min Guard A for safety Balance Overall balance  assessment: Needs assistance Sitting-balance support: No upper extremity supported, Feet supported, Single extremity supported Sitting balance-Leahy Scale: Normal Sitting balance - Comments: Able to sit at EOb with close Min Guard A for safety Standing balance support: During functional activity, No upper extremity supported Standing balance-Leahy Scale: Fair Standing balance comment: able to static stand without UE support.      Special needs/care consideration BiPAP/CPAP n/a CPM n/a Continuous Drip IV n/a porto cath implanted 10/19 Dialysis n/a Life Vest n/a Oxygen n/a Special Bed n/a Trach Size n/a Wound Vac n/a Skin surgical incision; JP french drain placed 4/10 LUQ; Jejunostomy tube placed 04/26/2018 Bowel mgmt: LBM 4/29 continent Bladder mgmt: continent Diabetic mgmt n/a Behavioral consideration  N/a Chemo/radiation yes completed 8 rounds prior to surgery/admission   Previous Home Environment  Living Arrangements: Alone  Lives With: Youth worker, ALvin) Available Help at Discharge: Friend(s), Available PRN/intermittently Type of Home: Apartment Home Layout: One level Home Access: Level entry Bathroom Shower/Tub: Tub/shower unit, Architectural technologist: Standard Bathroom Accessibility: Yes How Accessible: Accessible via walker Tresckow: No Additional Comments: with her dog  Roselee Nova, has her dog, Development worker, international aid  Discharge Living Setting Plans for Discharge Living Setting: Patient's home, Alone, Apartment Type of Home at Discharge: Apartment Discharge Home Layout: One level Discharge Home Access: Level entry Discharge Bathroom Shower/Tub: Tub/shower unit, Curtain Discharge Bathroom Toilet: Standard Discharge Bathroom Accessibility: Yes How Accessible: Accessible via walker Does the patient have any problems obtaining your medications?: No  Social/Family/Support Systems Contact Information: sister, Lenna [place], and friend, [place] Anticipated Caregiver:  friends  Anticipated Ambulance person Information: see above Ability/Limitations of Caregiver: intermittent only Caregiver Availability: Intermittent Discharge Plan Discussed with Primary Caregiver: No Is Caregiver In Agreement with Plan?: No Does Caregiver/Family have Issues with Lodging/Transportation while Pt is in Rehab?: No   Patient ws living with her Aunt until 01/2018. Edwyna Shell with Cancer center, [employer] [facility] and congregational nursing involved for she was asked by Aunt to find another place to live. [place] coalition and SWs assisted her to her apartment in January where she now has stable housing. Patient has car, disability and Medicaid as of January 2020. No more chemo planned at this time.  Goals/Additional Needs Patient/Family Goal for Rehab: Mod I with PT, OT, and SLP Expected length of stay: ELOS 7 to 9 days Dietary Needs: J tube; needs nutritional follow up Pt/Family Agrees to Admission and willing to participate: Yes Program Orientation Provided & Reviewed with Pt/Caregiver Including Roles  & Responsibilities: Yes  Barriers to Discharge: Decreased caregiver support  Decrease burden of Care through IP rehab admission: n/a  Possible need for SNF placement upon discharge: not anticipated  Patient Condition: This patient's medical and functional status has changed since the consult  dated: 05/13/2018 in which the Rehabilitation Physician determined and documented that the patient's condition is appropriate for intensive rehabilitative care in an inpatient rehabilitation facility. See "History of Present Illness" (above) for medical update. Functional changes are: overall min assist. Patient's medical and functional status update has been discussed with the Rehabilitation physician and patient remains appropriate for inpatient rehabilitation. Will admit to inpatient rehab today.  Preadmission Screen Completed By:  Cleatrice Burke, RN MSN, 05/16/2018 12:56  PM ______________________________________________________________________   Discussed status with Dr. Letta Pate on 05/16/2018 at  1300 and received approval for admission today.  Admission Coordinator:  Cleatrice Burke MSN time [id number] Date 05/16/2018

## 2018-05-16 NOTE — Progress Notes (Signed)
21 Days Post-Op   Subjective/Chief Complaint: Had issues taking meds and prostat.     Objective: Vital signs in last 24 hours: Temp:  [98.7 F (37.1 C)-99.6 F (37.6 C)] 98.7 F (37.1 C) (04/30 0754) Pulse Rate:  [88-106] 88 (04/30 0754) Resp:  [18-21] 18 (04/30 0754) BP: (119-130)/(70-84) 128/78 (04/30 0754) SpO2:  [95 %-100 %] 100 % (04/30 0754) Weight:  [66.4 kg] 66.4 kg (04/30 0500) Last BM Date: 05/15/18  Intake/Output from previous day: 04/29 0701 - 04/30 0700 In: 240 [P.O.:240] Out: 20 [Drains:20] Intake/Output this shift: No intake/output data recorded.  General appearance: cooperative, alert.  OOB Eyes: no icterus Resp: breathing comfortably Cardio: regular rate and rhythm GI: soft, incision healed, TF off, sl milky drain, but continues to clear. Output is down. Extremities: calves soft  Lab Results:  Recent Labs    05/16/18 0448  WBC 4.1  HGB 7.3*  HCT 22.7*  PLT 216   BMET Recent Labs    05/16/18 0448  NA 134*  K 3.5  CL 102  CO2 25  GLUCOSE 89  BUN <5*  CREATININE 0.75  CALCIUM 8.1*   PT/INR No results for input(s): LABPROT, INR in the last 72 hours. ABG No results for input(s): PHART, HCO3 in the last 72 hours.  Invalid input(s): PCO2, PO2  Studies/Results: Dg Abd 1 View  Result Date: 05/15/2018 CLINICAL DATA:  Abdominal distention and vomiting 2 days. EXAM: ABDOMEN - 1 VIEW COMPARISON:  CT 05/09/2018 and KUB 05/01/2018 FINDINGS: Pancreatic duct stent unchanged. Surgical drains over the left upper quadrant and left mid abdomen unchanged. Bowel gas pattern is nonobstructive. No free peritoneal air. Surgical suture line projects over the stomach. Remainder of the exam is unchanged. IMPRESSION: Nonobstructive bowel gas pattern. Postsurgical changes as described with several surgical drains as well as pancreatic duct stent unchanged. Electronically Signed   By: Marin Olp M.D.   On: 05/15/2018 13:36    Anti-infectives: Anti-infectives  (From admission, onward)   Start     Dose/Rate Route Frequency Ordered Stop   05/04/18 2100  vancomycin (VANCOCIN) IVPB 750 mg/150 ml premix     750 mg 150 mL/hr over 60 Minutes Intravenous Every 12 hours 05/04/18 2027 05/16/18 2359   05/03/18 1000  fluconazole (DIFLUCAN) IVPB 400 mg  Status:  Discontinued     400 mg 100 mL/hr over 120 Minutes Intravenous Every 24 hours 05/02/18 0721 05/02/18 0855   05/02/18 0800  vancomycin (VANCOCIN) 500 mg in sodium chloride 0.9 % 100 mL IVPB  Status:  Discontinued     500 mg 100 mL/hr over 60 Minutes Intravenous Every 12 hours 05/02/18 0721 05/04/18 2027   05/02/18 0800  piperacillin-tazobactam (ZOSYN) IVPB 3.375 g     3.375 g 12.5 mL/hr over 240 Minutes Intravenous Every 8 hours 05/02/18 0721 05/16/18 2359   05/02/18 0730  fluconazole (DIFLUCAN) IVPB 800 mg     800 mg 200 mL/hr over 120 Minutes Intravenous  Once 05/02/18 0721 05/02/18 0952   04/30/18 2230  vancomycin (VANCOCIN) IVPB 750 mg/150 ml premix  Status:  Discontinued     750 mg 150 mL/hr over 60 Minutes Intravenous Every 48 hours 04/28/18 2133 04/29/18 1121   04/30/18 2230  vancomycin (VANCOCIN) IVPB 1000 mg/200 mL premix  Status:  Discontinued     1,000 mg 200 mL/hr over 60 Minutes Intravenous Every 48 hours 04/29/18 1121 04/30/18 0904   04/30/18 1000  vancomycin (VANCOCIN) IVPB 1000 mg/200 mL premix     1,000 mg  200 mL/hr over 60 Minutes Intravenous Every 24 hours 04/30/18 0904 05/01/18 1011   04/30/18 0915  ceFEPIme (MAXIPIME) 2 g in sodium chloride 0.9 % 100 mL IVPB  Status:  Discontinued     2 g 200 mL/hr over 30 Minutes Intravenous Every 12 hours 04/30/18 0905 05/02/18 0721   04/28/18 2130  ceFEPIme (MAXIPIME) 2 g in sodium chloride 0.9 % 100 mL IVPB  Status:  Discontinued     2 g 200 mL/hr over 30 Minutes Intravenous Every 24 hours 04/28/18 2113 04/30/18 0905   04/28/18 2130  vancomycin (VANCOCIN) 1,250 mg in sodium chloride 0.9 % 250 mL IVPB     1,250 mg 166.7 mL/hr over 90  Minutes Intravenous  Once 04/28/18 2129 04/29/18 0107   04/25/18 1530  ceFAZolin (ANCEF) IVPB 2g/100 mL premix     2 g 200 mL/hr over 30 Minutes Intravenous Every 8 hours 04/25/18 1520 04/25/18 1739   04/25/18 0600  ceFAZolin (ANCEF) IVPB 2g/100 mL premix     2 g 200 mL/hr over 30 Minutes Intravenous On call to O.R. 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  1 LN positive and vein margin positive.      Neuro- improved.  Continue enteral morphine.  Speech for cognition.   Increased morphine for pain.  Good pain control.    Pulm/ID- reintubated for respiratory distress and increased WOB.  Zosyn/vanc should cover respiratory or GI pathogens.   Drain lipase very low, but drain #2 now milky.  Fever curve decreased; likely secondary to liver necrosis and pneumonia. Continue coverage for pneumonia and biliary pathogens which include gram negs, anerobes, MRSA and enterococcus.  Anticipate 2 week course of antibiotics.  Should stop after last dose today.   CV-HR finally near normal.  D/c cardiac monitoring.    GI-   LFTs back to normal.  Still infarction pattern on scan.  No sign of abscess. Previous liver duplex showed patent portal and hepatic veins.  Tube feeds were at goal prior to our attempt to do only oral intake.   Await full assessment of calorie count.  Had around 84% intake yesterday recorded.    Renal- a bit oliguric yesterday  FEN: Electrolytes better.  Prealbumin 13.  Diet as tolerated.  May have some element of delayed gastric emptying, watch for aspiration.  Speech working with.  Pt wants to try to get calories in PO.    Endocrine- hyperglycemia, continue ssi, glucose under reasonable control. At risk of developing diabetes given amount of pancreas removed.  Pt has not needed any insulin.  Will d/c CBGs.    Heme:  ABL anemia on anemia of chronic dx, anemia of chemotherapy.  stable  VTE  ppx- SCDs, lovenox ppx  Plan: rehab when bed available.  Continue working with diet.     LOS: 21 days    Stark Klein 05/16/2018

## 2018-05-17 ENCOUNTER — Inpatient Hospital Stay: Payer: Medicaid Other

## 2018-05-17 ENCOUNTER — Inpatient Hospital Stay (HOSPITAL_COMMUNITY): Payer: Medicaid Other | Admitting: Occupational Therapy

## 2018-05-17 ENCOUNTER — Inpatient Hospital Stay: Payer: Medicaid Other | Admitting: Hematology

## 2018-05-17 ENCOUNTER — Inpatient Hospital Stay (HOSPITAL_COMMUNITY): Payer: Medicaid Other | Admitting: Physical Therapy

## 2018-05-17 ENCOUNTER — Inpatient Hospital Stay (HOSPITAL_COMMUNITY): Payer: Medicaid Other | Admitting: Speech Pathology

## 2018-05-17 ENCOUNTER — Inpatient Hospital Stay (HOSPITAL_COMMUNITY): Payer: Medicaid Other

## 2018-05-17 DIAGNOSIS — R5381 Other malaise: Principal | ICD-10-CM

## 2018-05-17 DIAGNOSIS — D62 Acute posthemorrhagic anemia: Secondary | ICD-10-CM

## 2018-05-17 DIAGNOSIS — R339 Retention of urine, unspecified: Secondary | ICD-10-CM

## 2018-05-17 DIAGNOSIS — R000 Tachycardia, unspecified: Secondary | ICD-10-CM

## 2018-05-17 LAB — COMPREHENSIVE METABOLIC PANEL
ALT: 20 U/L (ref 0–44)
AST: 27 U/L (ref 15–41)
Albumin: 2.1 g/dL — ABNORMAL LOW (ref 3.5–5.0)
Alkaline Phosphatase: 72 U/L (ref 38–126)
Anion gap: 7 (ref 5–15)
BUN: <5 mg/dL — ABNORMAL LOW (ref 6–20)
CO2: 27 mmol/L (ref 22–32)
Calcium: 7.9 mg/dL — ABNORMAL LOW (ref 8.9–10.3)
Chloride: 104 mmol/L (ref 98–111)
Creatinine, Ser: 0.65 mg/dL (ref 0.44–1.00)
GFR calc Af Amer: >60 mL/min (ref >60)
GFR calc non Af Amer: >60 mL/min (ref >60)
Glucose, Bld: 92 mg/dL (ref 70–99)
Potassium: 3.5 mmol/L (ref 3.5–5.1)
Sodium: 138 mmol/L (ref 135–145)
Total Bilirubin: 0.6 mg/dL (ref 0.3–1.2)
Total Protein: 5.8 g/dL — ABNORMAL LOW (ref 6.5–8.1)

## 2018-05-17 LAB — CBC WITH DIFFERENTIAL/PLATELET
Abs Immature Granulocytes: 0.02 10*3/uL (ref 0.00–0.07)
Basophils Absolute: 0 10*3/uL (ref 0.0–0.1)
Basophils Relative: 0 %
Eosinophils Absolute: 0.2 10*3/uL (ref 0.0–0.5)
Eosinophils Relative: 4 %
HCT: 21.2 % — ABNORMAL LOW (ref 36.0–46.0)
Hemoglobin: 6.7 g/dL — CL (ref 12.0–15.0)
Immature Granulocytes: 0 %
Lymphocytes Relative: 31 %
Lymphs Abs: 1.6 10*3/uL (ref 0.7–4.0)
MCH: 24 pg — ABNORMAL LOW (ref 26.0–34.0)
MCHC: 31.6 g/dL (ref 30.0–36.0)
MCV: 76 fL — ABNORMAL LOW (ref 80.0–100.0)
Monocytes Absolute: 0.7 10*3/uL (ref 0.1–1.0)
Monocytes Relative: 12 %
Neutro Abs: 2.8 10*3/uL (ref 1.7–7.7)
Neutrophils Relative %: 53 %
Platelets: 173 10*3/uL (ref 150–400)
RBC: 2.79 MIL/uL — ABNORMAL LOW (ref 3.87–5.11)
RDW: 22.2 % — ABNORMAL HIGH (ref 11.5–15.5)
WBC: 5.3 10*3/uL (ref 4.0–10.5)
nRBC: 0 % (ref 0.0–0.2)

## 2018-05-17 LAB — CBC
HCT: 21.5 % — ABNORMAL LOW (ref 36.0–46.0)
Hemoglobin: 6.9 g/dL — CL (ref 12.0–15.0)
MCH: 24.5 pg — ABNORMAL LOW (ref 26.0–34.0)
MCHC: 32.1 g/dL (ref 30.0–36.0)
MCV: 76.2 fL — ABNORMAL LOW (ref 80.0–100.0)
Platelets: 186 10*3/uL (ref 150–400)
RBC: 2.82 MIL/uL — ABNORMAL LOW (ref 3.87–5.11)
RDW: 22 % — ABNORMAL HIGH (ref 11.5–15.5)
WBC: 5.1 10*3/uL (ref 4.0–10.5)
nRBC: 0 % (ref 0.0–0.2)

## 2018-05-17 LAB — GLUCOSE, CAPILLARY
Glucose-Capillary: 103 mg/dL — ABNORMAL HIGH (ref 70–99)
Glucose-Capillary: 106 mg/dL — ABNORMAL HIGH (ref 70–99)
Glucose-Capillary: 92 mg/dL (ref 70–99)
Glucose-Capillary: 86 mg/dL (ref 70–99)

## 2018-05-17 LAB — HEMOGLOBIN A1C
Hgb A1c MFr Bld: 6.2 % — ABNORMAL HIGH (ref 4.8–5.6)
Mean Plasma Glucose: 131.24 mg/dL

## 2018-05-17 MED ORDER — ONDANSETRON 4 MG PO TBDP
4.0000 mg | ORAL_TABLET | ORAL | Status: DC | PRN
  Administered 2018-05-17 – 2018-05-21 (×8): 4 mg via ORAL
  Filled 2018-05-17 (×9): qty 1

## 2018-05-17 MED ORDER — ENSURE ENLIVE PO LIQD
237.0000 mL | Freq: Three times a day (TID) | ORAL | Status: DC
  Administered 2018-05-17 – 2018-05-19 (×4): 237 mL via ORAL

## 2018-05-17 MED ORDER — ONDANSETRON HCL 4 MG/2ML IJ SOLN: 4.0000 mg | INTRAMUSCULAR | Status: DC | PRN

## 2018-05-17 NOTE — IPOC Note (Signed)
Overall Plan of Care [facility]) Patient Details Name: Jane Brooks MRN: [id number] DOB: 1970-03-21  Admitting Diagnosis: <principal problem not specified>  Hospital Problems: Active Problems:   Debility     Functional Problem List: Nursing Bladder, Bowel, Edema, Endurance, Nutrition, Pain  PT Balance, Pain  OT Balance, Pain, Safety, Endurance  SLP    TR         Basic ADL's: OT Bathing, Dressing, Grooming, Toileting     Advanced  ADL's: OT Simple Meal Preparation, Light Housekeeping, Laundry     Transfers: PT Bed Mobility, Bed to Chair, Musician, [employer], Futures trader, Metallurgist: PT Ambulation, Stairs     Additional Impairments: OT None  SLP None      TR      Anticipated Outcomes Item Anticipated Outcome  Self Feeding Indep  Swallowing      Basic self-care  Mod I  Toileting  Indep   Bathroom Transfers Mod I  Bowel/Bladder  To be continent x 2  Transfers  mod I  Locomotion  mod I with no AD  Communication     Cognition     Pain  pain less than 3  Safety/Judgment  to remain fall free   Therapy Plan: PT Intensity: Minimum of 1-2 x/day ,45 to 90 minutes PT Frequency: 5 out of 7 days PT Duration Estimated Length of Stay: 5-7 days OT Intensity: Minimum of 1-2 x/day, 45 to 90 minutes OT Frequency: 5 out of 7 days OT Duration/Estimated Length of Stay: 4-6  days     Due to the current state of emergency, patients may not be receiving their 3-hours of Medicare-mandated therapy.   Team Interventions: Nursing Interventions Patient/Family Education, Bowel Management, Pain Management, Skin Care/Wound Management, Bladder Management, Disease Management/Prevention, Discharge Planning  PT interventions Ambulation/gait training, Balance/vestibular training, Community reintegration, Discharge planning, Disease management/prevention, Functional mobility training, Pain management, Patient/family education, Psychosocial support, Stair training,  Therapeutic Activities, Therapeutic Exercise, UE/LE Strength taining/ROM, UE/LE Coordination activities  OT Interventions Training and development officer, Discharge planning, Community reintegration, Disease mangement/prevention, Neuromuscular re-education, Patient/family education, Self Care/advanced ADL retraining, Therapeutic Exercise, UE/LE Coordination activities, UE/LE Strength taining/ROM, Therapeutic Activities, Psychosocial support, Pain management, Functional mobility training, DME/adaptive equipment instruction  SLP Interventions    TR Interventions    SW/CM Interventions Discharge Planning, Psychosocial Support, Patient/Family Education   Barriers to Discharge MD  Medical stability  Nursing Wound Care, Nutrition means    PT Decreased caregiver support, Medical stability    OT      SLP      SW       Team Discharge Planning: Destination: PT-Home ,OT- Home , SLP-Home Projected Follow-up: PT-Home health PT, OT-  None, SLP-None Projected Equipment Needs: PT-None recommended by PT, OT- Tub/shower bench, SLP-None recommended by SLP Equipment Details: PT- , OT-  Patient/family involved in discharge planning: PT- Patient,  OT-Patient, SLP-Patient  MD ELOS: 5-7 days Medical Rehab Prognosis:  Excellent Assessment: The patient has been admitted for CIR therapies with the diagnosis of debility. The team will be addressing functional mobility, strength, stamina, balance, safety, adaptive techniques and equipment, self-care, bowel and bladder mgt, patient and caregiver education, pain medication, community reentry. Goals have been set at mod I with mobility and self-care.   Due to the current state of emergency, patients may not be receiving their 3 hours per day of Medicare-mandated therapy.    Meredith Staggers, MD, [facility]      See Team Conference Notes for weekly updates  to the plan of care

## 2018-05-17 NOTE — Evaluation (Signed)
Occupational Therapy Assessment and Plan  Patient Details  Name: Brielyn Bosak MRN: [id number] Date of Birth: 1970/01/31  OT Diagnosis: muscle weakness (generalized) Rehab Potential: Rehab Potential (ACUTE ONLY): Excellent ELOS: 4-6  days   Today's Date: 05/17/2018 OT Individual Time: 1345-1445 OT Individual Time Calculation (min): 60 min  and Today's Date: 05/17/2018 OT Missed Time: 15 Minutes Missed Time Reason: Patient fatigue    Problem List:  Patient Active Problem List   Diagnosis Date Noted  . Debility 05/16/2018  . Acute respiratory failure with hypoxemia ([place]) 04/30/2018  . HCAP (healthcare-associated pneumonia) 04/30/2018  . Essential hypertension 04/30/2018  . Chronic prescription opiate use 04/30/2018  . Postoperative pain 04/30/2018  . Tachycardia 04/30/2018  . Tachypnea 04/30/2018  . Acute respiratory alkalosis 04/30/2018  . 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 Genetic testing 01/24/2018  . Family history of uterine cancer   . Family history of prostate cancer   . Family history of lung cancer   . Pancreatic cancer ([place]) 11/09/2017  . Pancreatic mass   . Cholecystitis 10/30/2017  . Jaundice 10/30/2017    Past Medical History:  Past Medical History:  Diagnosis Date  . Family history of lung cancer   . Family history of prostate cancer   . Family history of uterine cancer   . Gallstones 10/2017  . Pancreatic cancer ([place])   . Pneumothorax, closed, traumatic    years ago  . PONV (postoperative nausea and vomiting)    Past Surgical History:  Past Surgical History:  Procedure Laterality Date  . BILIARY STENT PLACEMENT  10/31/2017   Procedure: BILIARY STENT PLACEMENT;  Surgeon: [name], MD;  Location: [facility] ENDOSCOPY;  Service: Gastroenterology;;  . ERCP N/A 10/31/2017   Procedure: ENDOSCOPIC RETROGRADE CHOLANGIOPANCREATOGRAPHY (ERCP);  Surgeon: [name], MD;  Location: [facility] ENDOSCOPY;  Service: Gastroenterology;  Laterality: N/A;   . ESOPHAGOGASTRODUODENOSCOPY N/A 11/08/2017   Procedure: ESOPHAGOGASTRODUODENOSCOPY (EGD);  Surgeon: Milus Banister, MD;  Location: [facility] ENDOS[facility];  Service: Endoscopy;  Laterality: N/A;  . EUS N/A 11/08/2017   Procedure: UPPER ENDOSCOPIC ULTRASOUND (EUS) RADIAL;  Surgeon: Milus Banister, MD;  Location: [facility] ENDOSCOPY;  Service: Endoscopy;  Laterality: N/A;  . FINE NEEDLE ASPIRATION  11/08/2017   Procedure: FINE NEEDLE ASPIRATION;  Surgeon: Milus Banister, MD;  Location: [facility] ENDOSCOPY;  Service: Endoscopy;;  . IR IMAGING GUIDED PORT INSERTION  11/15/2017  . LAPAROSCOPY N/A 04/25/2018   Procedure: LAPAROSCOPY DIAGNOSTIC;  Surgeon: Stark Klein, MD;  Location: [place];  Service: General;  Laterality: N/A;  GENERAL AND EPIDURAL ANESTHESIA  . SPHINCTEROTOMY  10/31/2017   Procedure: SPHINCTEROTOMY;  Surgeon: [name], MD;  Location: [facility] ENDOSCOPY;  Service: Gastroenterology;;  . TONSILLECTOMY    . 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  She continued to have significant upper abdominal pain with nausea and weight loss therefore was admitted on 04/25/2018 for Whipple procedure with portal vein reconstruction, placement of pancreatic stent and jejunostomy feeding tube by Dr. Barry Dienes.  Postop course complicated by hypotension with AKI, shock liver, tachycardia as well as SIRS.  She tolerated extubation on 04/26/18 and AKI resolved with hydration.  On 04/28/2018,  she started developing increased WOB and was started  on cefepime for RUL HCAP but continued to have fevers with poor pain control as well as elevated levels of anxiety requiring reintubation 05/01/18. Antibiotics coverage broadened  to Vanc/Zosyn and she was started on enteral morphine for pain control.    CT abdomenpelvis showed multilobar pneumonia with 9 cm lobulated density in right hepatic lobe and thickening of right and proximal transverse colon concerning for colitis. MRI abdomen showed hepatic necrosis and she was treated with FFP for thrombocytopenia and hepatic encephalopathy resolving with addition of lactulose. She tolerated extubation by 4/25 and diet slowly advanced to regular. Follow up CT abdomen 4/23 shoed low density consistent with infarction of all of right hepatic lobe--stable and colonic edema or possible inflammation.   Hospital course significant for electrolyte abnormality, diarrhea, urinary retention, hyperglycemia and ongoing issue with abdominal pain. KUB 4/29 without ileus. ABLA. She completes 2 week course of  Vanc/Zosyn today. Foley d/c today and LLQ drain to stay in place till early next week due to ongoing cloudy drainage. Therapy ongoing and patient continues to have limitations in mobility and self care tasksa as well as high level cognitive deficits. CIR recommended due to functional decline.    Patient transferred to CIR on 05/16/2018 .    Patient currently requires supervision with basic self-care skills secondary to muscle weakness, decreased cardiorespiratoy endurance and decreased standing balance and decreased postural control.  Prior to hospitalization, patient could complete ADLs/IADLs with independent .  Patient will benefit from skilled intervention to increase independence with basic self-care skills and increase level of independence with iADL prior to discharge home independently.  Anticipate patient will require intermittent supervision and no further OT follow recommended.  OT - End of Session Activity Tolerance: Tolerates 10 - 20 min activity with multiple rests Endurance Deficit: Yes Endurance Deficit Description: Required rest breaks throughout session and requesting to end tx  session early 2/2 fatigue OT Assessment Rehab Potential (ACUTE ONLY): Excellent OT Patient demonstrates impairments in the following area(s): Balance;Pain;Safety;Endurance OT Basic ADL's Functional Problem(s): Bathing;Dressing;Grooming;Toileting OT Advanced ADL's Functional Problem(s): Simple Meal Preparation;Light Housekeeping;Laundry OT Transfers Functional Problem(s): Toilet;Tub/Shower OT Additional Impairment(s): None OT Plan OT Intensity: Minimum of 1-2 x/day, 45 to 90 minutes OT Frequency: 5 out of 7 days OT Duration/Estimated Length of Stay: 4-6  days OT Treatment/Interventions: Balance/vestibular training;Discharge planning;Community reintegration;Disease mangement/prevention;Neuromuscular re-education;Patient/family education;Self Care/advanced ADL retraining;Therapeutic Exercise;UE/LE Coordination activities;UE/LE Strength taining/ROM;Therapeutic Activities;Psychosocial support;Pain management;Functional mobility training;DME/adaptive equipment instruction OT Self Feeding Anticipated Outcome(s): Indep OT Basic Self-Care Anticipated Outcome(s): Mod I OT Toileting Anticipated Outcome(s): Indep OT Bathroom Transfers Anticipated Outcome(s): Mod I OT Recommendation Recommendations for Other Services: Neuropsych consult Patient destination: Home Follow Up Recommendations: None Equipment Recommended: Tub/shower bench   Skilled Therapeutic Intervention Pt seen for OT eval and ADL bathing/dressing session. Pt asleep in supine upon arrival, easily awoken and agreeable to tx session. She denied pain though with complaints of fatigue from previous tx sessions, however, willing to participate in tx session as able. She ambulated throughout room with supervision, no AD. Toileting task completed with distant supervision. She completed bathing/dressing routine at sink, education/encouragement to sit to complete task for energy conservation and increased safety 2/2 impaired balance. She stood  with supervision when completing pericare/buttock hygiene and LB clothing management. Bathing/dressing task completed overall supervision. She stood at sink to complete grooming tasks with supervision. Following seated rest break, she ambulated to ADL apartment. Education and demonstration provided regarding various DME for tub/shower transfer and bathing task, including TTB, BSC, and shower chair. Pt  completed simulated transfer utilizing tub bench and voiced feeling really comfortable with this method and declining practice stepping over tub wall as she liked the bench and felt most safe. Pt requesting to return to room to rest. Ambulated back to room with supervision and left in supine with all needs in reach and bed alarm on. Education provided throughout session regarding role of OT, POC, OT goal, ELOS, energy conservation and d/c planning.   OT Evaluation Precautions/Restrictions  Precautions Precautions: Fall Restrictions Weight Bearing Restrictions: No General Chart Reviewed: Yes OT Amount of Missed Time: 15 Minutes Pain   No/denies pain Home Living/Prior Functioning Home Living Family/patient expects to be discharged to:: Private residence Living Arrangements: Alone Available Help at Discharge: Friend(s), Available PRN/intermittently Type of Home: Apartment Home Access: Level entry Home Layout: One level Bathroom Shower/Tub: Tub/shower unit, Architectural technologist: Programmer, systems: Yes  Lives With: Alone IADL History Homemaking Responsibilities: Yes Current License: Yes Mode of Transportation: Car Occupation: On disability Type of Occupation: On disability since cancer diagnosis Leisure and Hobbies: Loves to read, plays with her dog Prior Function Level of Independence: Independent with gait, Independent with transfers, Independent with basic ADLs, Independent with homemaking with ambulation  Able to Take Stairs?: Yes Driving: Yes Vocation: On  disability Comments: (on disability since cancer diagnosis Oct 2019) Vision Baseline Vision/History: Wears glasses(Contacts) Wears Glasses: At all times Patient Visual Report: No change from baseline Vision Assessment?: No apparent visual deficits Perception  Perception: Within Functional Limits Praxis Praxis: Intact Cognition Overall Cognitive Status: Within Functional Limits for tasks assessed Arousal/Alertness: Awake/alert Orientation Level: Person;Place;Situation Person: Oriented Place: Oriented Situation: Oriented Year: 2020 Month: May Day of Week: Correct Memory: Appears intact Immediate Memory Recall: Sock;Blue;Bed Memory Recall: Sock;Blue;Bed Memory Recall Sock: Without Cue Memory Recall Blue: Without Cue Memory Recall Bed: Without Cue Attention: Focused;Sustained Focused Attention: Appears intact Sustained Attention: Appears intact Awareness: Appears intact Problem Solving: Appears intact Safety/Judgment: Appears intact Sensation Sensation Light Touch: Appears Intact Proprioception: Appears Intact Coordination Gross Motor Movements are Fluid and Coordinated: No Fine Motor Movements are Fluid and Coordinated: Yes Coordination and Movement Description: impaired by generalized weakness Motor  Motor Motor: Within Functional Limits Motor - Skilled Clinical Observations: generalized weakness Trunk/Postural Assessment  Cervical Assessment Cervical Assessment: Within Functional Limits Thoracic Assessment Thoracic Assessment: Within Functional Limits Lumbar Assessment Lumbar Assessment: Within Functional Limits Postural Control Postural Control: Within Functional Limits  Balance Balance Balance Assessed: Yes Static Sitting Balance Static Sitting - Balance Support: No upper extremity supported;Feet supported Static Sitting - Level of Assistance: 7: Independent Dynamic Sitting Balance Dynamic Sitting - Balance Support: No upper extremity supported;Feet  supported;During functional activity Dynamic Sitting - Level of Assistance: 5: Stand by assistance Sitting balance - Comments: Sitting to complete bathing/dressing task Static Standing Balance Static Standing - Balance Support: No upper extremity supported;During functional activity Static Standing - Level of Assistance: 5: Stand by assistance Dynamic Standing Balance Dynamic Standing - Balance Support: No upper extremity supported;During functional activity Dynamic Standing - Level of Assistance: 5: Stand by assistance Dynamic Standing - Comments: Standing to complete bathing/dressing task Extremity/Trunk Assessment RUE Assessment RUE Assessment: Within Functional Limits LUE Assessment LUE Assessment: Within Functional Limits     Refer to Care Plan for Long Term Goals  Recommendations for other services: Neuropsych   Discharge Criteria: Patient will be discharged from OT if patient refuses treatment 3 consecutive times without medical reason, if treatment goals not met, if there is a change in medical status, if patient  makes no progress towards goals or if patient is discharged from hospital.  The above assessment, treatment plan, treatment alternatives and goals were discussed and mutually agreed upon: by patient  Maritza Goldsborough L 05/17/2018, 3:12 PM

## 2018-05-17 NOTE — Evaluation (Signed)
Speech Language Pathology Assessment and Plan  Patient Details  Name: Jane Brooks MRN: [id number] Date of Birth: [birth year]  Evaluation Only  Today's Date: 05/17/2018 SLP Individual Time: 1100-1200 SLP Individual Time Calculation (min): 60 min   Problem List:  Patient Active Problem List   Diagnosis Date Noted  . Debility 05/16/2018  . Acute respiratory failure with hypoxemia ([place]) 04/30/2018  . HCAP (healthcare-associated pneumonia) 04/30/2018  . Essential hypertension 04/30/2018  . Chronic prescription opiate use 04/30/2018  . Postoperative pain 04/30/2018  . Tachycardia 04/30/2018  . Tachypnea 04/30/2018  . Acute respiratory alkalosis 04/30/2018  . 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 Genetic testing 01/24/2018  . Family history of uterine cancer   . Family history of prostate cancer   . Family history of lung cancer   . Pancreatic cancer ([place]) 11/09/2017  . Pancreatic mass   . Cholecystitis 10/30/2017  . Jaundice 10/30/2017   Past Medical History:  Past Medical History:  Diagnosis Date  . Family history of lung cancer   . Family history of prostate cancer   . Family history of uterine cancer   . Gallstones 10/2017  . Pancreatic cancer ([place])   . Pneumothorax, closed, traumatic    years ago  . PONV (postoperative nausea and vomiting)    Past Surgical History:  Past Surgical History:  Procedure Laterality Date  . BILIARY STENT PLACEMENT  10/31/2017   Procedure: BILIARY STENT PLACEMENT;  Surgeon: [name], MD;  Location: [facility] ENDOSCOPY;  Service: Gastroenterology;;  . ERCP N/A 10/31/2017   Procedure: ENDOSCOPIC RETROGRADE CHOLANGIOPANCREATOGRAPHY (ERCP);  Surgeon: [name], MD;  Location: [facility] ENDOSCOPY;  Service: Gastroenterology;  Laterality: N/A;  . ESOPHAGOGASTRODUODENOSCOPY N/A 11/08/2017   Procedure: ESOPHAGOGASTRODUODENOSCOPY (EGD);  Surgeon: Milus Banister, MD;  Location: [facility] ENDOS[facility];  Service: Endoscopy;  Laterality: N/A;  .  EUS N/A 11/08/2017   Procedure: UPPER ENDOSCOPIC ULTRASOUND (EUS) RADIAL;  Surgeon: Milus Banister, MD;  Location: [facility] ENDOSCOPY;  Service: Endoscopy;  Laterality: N/A;  . FINE NEEDLE ASPIRATION  11/08/2017   Procedure: FINE NEEDLE ASPIRATION;  Surgeon: Milus Banister, MD;  Location: [facility] ENDOSCOPY;  Service: Endoscopy;;  . IR IMAGING GUIDED PORT INSERTION  11/15/2017  . LAPAROSCOPY N/A 04/25/2018   Procedure: LAPAROSCOPY DIAGNOSTIC;  Surgeon: Stark Klein, MD;  Location: [place];  Service: General;  Laterality: N/A;  GENERAL AND EPIDURAL ANESTHESIA  . SPHINCTEROTOMY  10/31/2017   Procedure: SPHINCTEROTOMY;  Surgeon: [name], MD;  Location: [facility] ENDOSCOPY;  Service: Gastroenterology;;  . TONSILLECTOMY    . WHIPPLE PROCEDURE N/A 04/25/2018   Procedure: WHIPPLE PROCEDURE, RECONSTRUCTION OF PORTAL VEIN;  Surgeon: Stark Klein, MD;  Location: [place];  Service: General;  Laterality: N/A;    Assessment / Plan / Recommendation Clinical Impression  Pt presents with functional cognitive abilities c/b ability to complete very high level cognitive tasks.    Skilled Therapeutic Interventions          Pt able to complete very complex deductive reasoning tasks and interdisciplinary team do not identify any cognitive deficits. All education completed and ST will sign off at this time.    SLP Assessment  Patient does not need any further Speech [place] Pathology Services    Recommendations  Patient destination: Home Follow up Recommendations: None Equipment Recommended: None recommended by SLP           Pain Pain Assessment Pain Scale: 0-10 Pain Score: 0-No pain  Prior Functioning Cognitive/Linguistic Baseline: Within functional limits Type of Home: Apartment  Lives With: Alone Available Help at Discharge: Friend(s);Available PRN/intermittently Vocation: On disability  Short Term Goals: No short term goals set  Refer to Care Plan for Long Term Goals  Recommendations for other  services: None   Discharge Criteria: Patient will be discharged from SLP if patient refuses treatment 3 consecutive times without medical reason, if treatment goals not met, if there is a change in medical status, if patient makes no progress towards goals or if patient is discharged from hospital.  The above assessment, treatment plan, treatment alternatives and goals were discussed and mutually agreed upon: by patient  Radin Raptis 05/17/2018, 12:09 PM

## 2018-05-17 NOTE — Evaluation (Signed)
Physical Therapy Assessment and Plan  Patient Details  Name: Jane Brooks MRN: [id number] Date of Birth: [birth year]  PT Diagnosis: Abnormality of gait and Pain in abdomen Rehab Potential: Excellent ELOS: 5-7 days   Today's Date: 05/17/2018 PT Individual Time: 0800-0910 PT Individual Time Calculation (min): 70 min    Problem List:  Patient Active Problem List   Diagnosis Date Noted  . Debility 05/16/2018  . Acute respiratory failure with hypoxemia ([place]) 04/30/2018  . HCAP (healthcare-associated pneumonia) 04/30/2018  . Essential hypertension 04/30/2018  . Chronic prescription opiate use 04/30/2018  . Postoperative pain 04/30/2018  . Tachycardia 04/30/2018  . Tachypnea 04/30/2018  . Acute respiratory alkalosis 04/30/2018  . Adenocarcinoma of head of pancreas ([place]) 04/25/2018  . Genetic testing 01/24/2018  . Family history of uterine cancer   . Family history of prostate cancer   . Family history of lung cancer   . Pancreatic cancer ([place]) 11/09/2017  . Pancreatic mass   . Cholecystitis 10/30/2017  . Jaundice 10/30/2017    Past Medical History:  Past Medical History:  Diagnosis Date  . Family history of lung cancer   . Family history of prostate cancer   . Family history of uterine cancer   . Gallstones 10/2017  . Pancreatic cancer ([place])   . Pneumothorax, closed, traumatic    years ago  . PONV (postoperative nausea and vomiting)    Past Surgical History:  Past Surgical History:  Procedure Laterality Date  . BILIARY STENT PLACEMENT  10/31/2017   Procedure: BILIARY STENT PLACEMENT;  Surgeon: [name], MD;  Location: [facility] ENDOSCOPY;  Service: Gastroenterology;;  . ERCP N/A 10/31/2017   Procedure: ENDOSCOPIC RETROGRADE CHOLANGIOPANCREATOGRAPHY (ERCP);  Surgeon: [name], MD;  Location: [facility] ENDOSCOPY;  Service: Gastroenterology;  Laterality: N/A;  . ESOPHAGOGASTRODUODENOSCOPY N/A 11/08/2017   Procedure: ESOPHAGOGASTRODUODENOSCOPY (EGD);  Surgeon: Milus Banister, MD;  Location: [facility] ENDOSCOPY;  Service: Endoscopy;  Laterality: N/A;  . EUS N/A 11/08/2017   Procedure: UPPER ENDOSCOPIC ULTRASOUND (EUS) RADIAL;  Surgeon: Milus Banister, MD;  Location: [facility] ENDOSCOPY;  Service: Endoscopy;  Laterality: N/A;  . FINE NEEDLE ASPIRATION  11/08/2017   Procedure: FINE NEEDLE ASPIRATION;  Surgeon: Milus Banister, MD;  Location: [facility] ENDOSCOPY;  Service: Endoscopy;;  . IR IMAGING GUIDED PORT INSERTION  11/15/2017  . LAPAROSCOPY N/A 04/25/2018   Procedure: LAPAROSCOPY DIAGNOSTIC;  Surgeon: Stark Klein, MD;  Location: [place];  Service: General;  Laterality: N/A;  GENERAL AND EPIDURAL ANESTHESIA  . SPHINCTEROTOMY  10/31/2017   Procedure: SPHINCTEROTOMY;  Surgeon: [name], MD;  Location: [facility] ENDOSCOPY;  Service: Gastroenterology;;  . TONSILLECTOMY    . 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  She continued to have significant upper abdominal pain with nausea and weight loss therefore was admitted on 04/25/2018 for Whipple procedure with portal vein reconstruction, placement of pancreatic stent and jejunostomy feeding tube by Dr. Barry Dienes.  Postop course complicated by hypotension with AKI, shock liver, tachycardia as well as SIRS.  She tolerated extubation on 04/26/18 and AKI resolved with hydration.  On 04/28/2018,  she started developing increased WOB and was started on cefepime for RUL HCAP but continued to have fevers with poor pain control as  well as elevated levels of anxiety requiring reintubation 05/01/18. Antibiotics coverage broadened to Vanc/Zosyn and she was started on enteral morphine for pain control.    CT abdomenpelvis showed multilobar  pneumonia with 9 cm lobulated density in right hepatic lobe and thickening of right and proximal transverse colon concerning for colitis. MRI abdomen showed hepatic necrosis and she was treated with FFP for thrombocytopenia and hepatic encephalopathy resolving with addition of lactulose. She tolerated extubation by 4/25 and diet slowly advanced to regular. Follow up CT abdomen 4/23 shoed low density consistent with infarction of all of right hepatic lobe--stable and colonic edema or possible inflammation.   Hospital course significant for electrolyte abnormality, diarrhea, urinary retention, hyperglycemia and ongoing issue with abdominal pain. KUB 4/29 without ileus. ABLA. She completes 2 week course of  Vanc/Zosyn today. Foley d/c today and LLQ drain to stay in place till early next week due to ongoing cloudy drainage. Therapy ongoing and patient continues to have limitations in mobility and self care tasksa as well as high level cognitive deficits. CIR recommended due to functional decline. Patient transferred to CIR on 05/16/2018 .   Patient currently requires supervision with mobility secondary to muscle weakness and decreased standing balance, decreased postural control and decreased balance strategies.  Prior to hospitalization, patient was independent  with mobility and lived with Alone in a [facility].  Home access is  Level entry.  Patient will benefit from skilled PT intervention to maximize safe functional mobility, minimize fall risk and decrease caregiver burden for planned discharge home alone.  Anticipate patient will benefit from follow up [place] at discharge.  PT - End of Session Activity Tolerance: Tolerates 30+ min activity without fatigue Endurance Deficit: No PT Assessment Rehab Potential (ACUTE/IP ONLY): Excellent PT Barriers to Discharge: Decreased caregiver support;Medical stability PT Patient demonstrates impairments in the following area(s): Balance;Pain PT Transfers Functional  Problem(s): Bed Mobility;Bed to Chair;Car;Furniture;Floor PT Locomotion Functional Problem(s): Ambulation;Stairs PT Plan PT Intensity: Minimum of 1-2 x/day ,45 to 90 minutes PT Frequency: 5 out of 7 days PT Duration Estimated Length of Stay: 5-7 days PT Treatment/Interventions: Ambulation/gait training;Balance/vestibular training;Community reintegration;Discharge planning;Disease management/prevention;Functional mobility training;Pain management;Patient/family education;Psychosocial support;Stair training;Therapeutic Activities;Therapeutic Exercise;UE/LE Strength taining/ROM;UE/LE Coordination activities PT Transfers Anticipated Outcome(s): mod I PT Locomotion Anticipated Outcome(s): mod I with no AD PT Recommendation Follow Up Recommendations: Home health PT Patient destination: Home Equipment Recommended: None recommended by PT  Skilled Therapeutic Intervention Evaluation completed (see details above and below) with education on PT POC and goals and individual treatment initiated with focus on functional transfer and balance assessment. Pt received seated in recliner in room, agreeable to PT session. Pt reports some soreness on her abdomen at surgical site. Pt performs sit to stand with Supervision and is able to retrieve clothes to change into. Pt is Supervision for standing balance while changing from hospital gown into pants and a tshirt and jacket. Ambulation x 200 ft with no AD and Supervision, occasional ataxia and path deviation with gait. Ascend/descend 12 stairs with 2 handrails and Supervision, step-through gait pattern. Car transfer with Supervision with v/c for safe transfer technique. Berg Balance Test [phone], high fall risk. Reviewed fall risk and safety with patient, education that she still needs to call for assist when getting up in her room. Pt agreeable and receptive to education. Pt very motivated and excited to participate in therapy session. Pt left seated in recliner in room  with needs in reach at end of session.  PT Evaluation Precautions/Restrictions Precautions Precautions:  Fall Restrictions Weight Bearing Restrictions: No Pain Pain Assessment Pain Scale: 0-10 Pain Score: 0-No pain Home Living/Prior Functioning Home Living Available Help at Discharge: Friend(s);Available PRN/intermittently Type of Home: Apartment Home Access: Level entry Home Layout: One level  Lives With: Alone Prior Function Level of Independence: Independent with gait;Independent with transfers  Able to Take Stairs?: Yes Driving: Yes Vocation: On disability Comments: (on disability since cancer diagnosis Oct 2019) Vision/Perception  Vision - History Baseline Vision: Wears Health visitor: Within Functional Limits Praxis Praxis: Intact  Cognition Overall Cognitive Status: Within Functional Limits for tasks assessed Arousal/Alertness: Awake/alert Orientation Level: Oriented X4 Attention: Focused;Sustained Focused Attention: Appears intact Sustained Attention: Appears intact Memory: Appears intact Awareness: Appears intact Problem Solving: Appears intact Safety/Judgment: Appears intact Sensation Sensation Light Touch: Appears Intact Proprioception: Appears Intact Coordination Gross Motor Movements are Fluid and Coordinated: No Fine Motor Movements are Fluid and Coordinated: Yes Coordination and Movement Description: impaired by generalized weakness Heel Shin Test: impaired B 2/2 abdominal swelling Motor  Motor Motor: Within Functional Limits Motor - Skilled Clinical Observations: generalized weakness  Mobility Bed Mobility Bed Mobility: Rolling Right;Rolling Left;Supine to Sit;Sit to Supine Rolling Right: Supervision/verbal cueing Rolling Left: Supervision/Verbal cueing Supine to Sit: Supervision/Verbal cueing Sit to Supine: Supervision/Verbal cueing Transfers Transfers: Sit to Stand;Stand Pivot Transfers Sit to Stand: Supervision/Verbal  cueing Stand Pivot Transfers: Supervision/Verbal cueing Stand Pivot Transfer Details: Verbal cues for precautions/safety Transfer (Assistive device): None Locomotion  Gait Gait Distance (Feet): 200 Feet Assistive device: None Gait Gait Pattern: Impaired(slightly ataxic) Gait velocity: decreased Stairs / Additional Locomotion Stairs: Yes Stairs Assistance: Supervision/Verbal cueing Stair Management Technique: Two rails;Alternating pattern Number of Stairs: 12 Height of Stairs: 6 Wheelchair Mobility Wheelchair Mobility: No  Trunk/Postural Assessment  Cervical Assessment Cervical Assessment: Within Functional Limits Thoracic Assessment Thoracic Assessment: Within Functional Limits Lumbar Assessment Lumbar Assessment: Within Functional Limits Postural Control Postural Control: Within Functional Limits  Balance Balance Balance Assessed: Yes Standardized Balance Assessment Standardized Balance Assessment: Berg Balance Test Berg Balance Test Sit to Stand: Able to stand without using hands and stabilize independently Standing Unsupported: Able to stand 2 minutes with supervision Sitting with Back Unsupported but Feet Supported on Floor or Stool: Able to sit safely and securely 2 minutes Stand to Sit: Sits safely with minimal use of hands Transfers: Able to transfer safely, minor use of hands Standing Unsupported with Eyes Closed: Able to stand 10 seconds with supervision Standing Ubsupported with Feet Together: Able to place feet together independently and stand for 1 minute with supervision From Standing, Reach Forward with Outstretched Arm: Reaches forward but needs supervision From Standing Position, Pick up Object from Floor: Able to pick up shoe, needs supervision From Standing Position, Turn to Look Behind Over each Shoulder: Needs supervision when turning Turn 360 Degrees: Needs close supervision or verbal cueing Standing Unsupported, Alternately Place Feet on Step/Stool:  Able to complete >2 steps/needs minimal assist Standing Unsupported, One Foot in Front: Able to take small step independently and hold 30 seconds Standing on One Leg: Unable to try or needs assist to prevent fall Total Score: 34 Static Sitting Balance Static Sitting - Balance Support: No upper extremity supported;Feet supported Static Sitting - Level of Assistance: 5: Stand by assistance Dynamic Sitting Balance Dynamic Sitting - Balance Support: No upper extremity supported;Feet supported;During functional activity Dynamic Sitting - Level of Assistance: 5: Stand by assistance Static Standing Balance Static Standing - Balance Support: No upper extremity supported;During functional activity Static Standing - Level of Assistance: 5: Stand  by assistance Dynamic Standing Balance Dynamic Standing - Balance Support: No upper extremity supported;During functional activity Dynamic Standing - Level of Assistance: 5: Stand by assistance Extremity Assessment   RLE Assessment RLE Assessment: Within Functional Limits Passive Range of Motion (PROM) Comments: WFL General Strength Comments: 4+/5 LLE Assessment LLE Assessment: Within Functional Limits Passive Range of Motion (PROM) Comments: WFL General Strength Comments: 5/5    Refer to Care Plan for Long Term Goals  Recommendations for other services: None   Discharge Criteria: Patient will be discharged from PT if patient refuses treatment 3 consecutive times without medical reason, if treatment goals not met, if there is a change in medical status, if patient makes no progress towards goals or if patient is discharged from hospital.  The above assessment, treatment plan, treatment alternatives and goals were discussed and mutually agreed upon: by patient   Excell Seltzer, PT, DPT 05/17/2018, 11:56 AM

## 2018-05-17 NOTE — Progress Notes (Signed)
Inpatient Rehabilitation  Patient information reviewed and entered into eRehab system by Medhansh Brinkmeier M. Dillion Stowers, M.A., CCC/SLP, PPS Coordinator.  Information including medical coding, functional ability and quality indicators will be reviewed and updated through discharge.    

## 2018-05-17 NOTE — Progress Notes (Signed)
Initial Nutrition Assessment  DOCUMENTATION CODES:   Not applicable  INTERVENTION:   - Unable to reach pt by phone today on several attempts. Will plan to provide education in-person on Monday, 05/20/18  - d/c Boost Breeze  - Ensure Enlive po TID, each supplement provides 350 kcal and 20 grams of protein  - Encourage adequate PO intake  NUTRITION DIAGNOSIS:   Increased nutrient needs related to post-op healing, cancer and cancer related treatments as evidenced by estimated needs.  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 Pt continued to have significant upper abdominal pain, nausea, and weight loss. Pt admitted on 04/9 for Whipple with portal vein reconstruction, placement of pancreatic stent, and jejunostomy feeding tube. Post-op course complicated by hypotension with AKI, shock liver, tachycardia, and SIRS. Pt tolerated extubation on 04/26/18 and AKI resolved with hydration. On 4/12, pt started developing increased WOB and was started on abx for HCAP but continued to have fevers with poor pain control as well as elevated levels of anxiety requiring reintubation 4/15. CT abdomen/pelvis showed multilobar PNA with 9 cm lobulated density in right hepatic lobe and thickening of right and proximal transverse colon concerning for colitis. MRI abdomen showed hepatic necrosis and pt was treated with FFP for thrombocytopenia and hepatic encephalopathy resolving with addition of lactulose. Pt tolerated extubation by 4/25 and diet slowly advanced to regular. Admitted to CIR on 4/30.  RD consulted for education regarding carb modified diet. Pt is currently ordered a Regular diet. Hemoglobin A1C on today was 6.2. Suspect  A1C is slightly elevated more in response to elevated blood sugars related to acute illness and cancer-related treatments rather than diet-related.  J-tube in place but pt not on TF at this time. Pt with RLQ drain still draining cloudy fluid per MD.  Attempted to speak with pt by calling room phone several times but pt did not answer. RD will plan to provide education regarding carb modified diet in-person on Monday, 05/20/18. Will also obtain diet and weight history at that time.  Reviewed available weight history in chart. Pt with gradual weight loss from October 2019 to January 2020 and then gradual weight gain from January 2020 to April 2020. Suspect weight gain over recent weights is related to fluid status given pt with mild pitting edema to BLE. Since admission to CIR, pt with weight loss of 1 lb. Will continue to monitor trends.  Meal Completion: 25-50% x 2 meals  Medications reviewed and include: Boost Breeze BID, SSI, Creon 36,000 units TID, Protonix, K-dur 20 mEq BID  Labs reviewed: hemoglobin 6.7 (L) CBG's: 106, 103, 86, 90 x 24 hours  NUTRITION - FOCUSED PHYSICAL EXAM:  Unable to complete at this time. RD working remotely.  Diet Order:   Diet Order            Diet regular Room service appropriate? Yes; Fluid consistency: Thin  Diet effective now              EDUCATION NEEDS:   Not appropriate for education at this time  Skin:  Skin Assessment: Skin Integrity Issues: Incisions: abdomen  Last BM:  05/17/18 medium type 3  Height:   Ht Readings from Last 1 Encounters:  05/16/18 4'  11" (1.499 m)    Weight:   Wt Readings from Last 1 Encounters:  05/17/18 63.8 kg    Ideal Body Weight:  43.2 kg  BMI:  Body mass index is 28.41 kg/m.  Estimated Nutritional Needs:   Kcal:  1700-1900  Protein:  85-100 grams  Fluid:  1.7-1.9 L    Gaynell Face, MS, RD, LDN Inpatient Clinical Dietitian Pager: [phone] Weekend/After Hours: [phone]

## 2018-05-17 NOTE — Progress Notes (Signed)
[facility] PHYSICAL MEDICINE & REHABILITATION PROGRESS NOTE   Subjective/Complaints: Had a pretty good night. Having ongoing nausea. Asked if I could adjust nausea medication regimen. Abdomen only slightly sensitive to touch.   ROS: Patient denies fever, rash, sore throat, blurred vision,  vomiting, diarrhea, cough, shortness of breath or chest pain, joint or back pain, headache, or mood change.    Objective:   Dg Abd 1 View  Result Date: 05/15/2018 CLINICAL DATA:  Abdominal distention and vomiting 2 days. EXAM: ABDOMEN - 1 VIEW COMPARISON:  CT 05/09/2018 and KUB 05/01/2018 FINDINGS: Pancreatic duct stent unchanged. Surgical drains over the left upper quadrant and left mid abdomen unchanged. Bowel gas pattern is nonobstructive. No free peritoneal air. Surgical suture line projects over the stomach. Remainder of the exam is unchanged. IMPRESSION: Nonobstructive bowel gas pattern. Postsurgical changes as described with several surgical drains as well as pancreatic duct stent unchanged. Electronically Signed   By: Marin Olp M.D.   On: 05/15/2018 13:36   Recent Labs    05/16/18 0448 05/17/18 0457  WBC 4.1 5.3  HGB 7.3* 6.7*  HCT 22.7* 21.2*  PLT 216 173   Recent Labs    05/16/18 0448 05/17/18 0457  NA 134* 138  K 3.5 3.5  CL 102 104  CO2 25 27  GLUCOSE 89 92  BUN <5* <5*  CREATININE 0.75 0.65  CALCIUM 8.1* 7.9*    Intake/Output Summary (Last 24 hours) at 05/17/2018 1032 Last data filed at 05/17/2018 0844 Gross per 24 hour  Intake 240 ml  Output -  Net 240 ml     Physical Exam: Vital Signs Blood pressure 126/76, pulse 100, temperature 98.6 F (37 C), temperature source Oral, resp. rate 18, height 4' 11"  (1.499 m), weight 63.8 kg, SpO2 97 %. Constitutional: No distress . Vital signs reviewed. HEENT: EOMI, oral membranes moist Neck: supple Cardiovascular: RRR without murmur. No JVD    Respiratory: CTA Bilaterally without wheezes or rales. Normal effort    GI: BS  +,sl-tender,  Distended. j-tube in place, RLQ drain intact with 20cc of cloudy yellow fluid in bag  Skin: No evidence of breakdown, no evidence of rash Neurologic: Cranial nerves II through XII intact, motor strength 4/5 in all 4's. Normal coordination.  Normal sensation.  Psych: pleasant and cooperative  Assessment/Plan: 1. Functional deficits secondary to debility which require 3+ hours per day of interdisciplinary therapy in a comprehensive inpatient rehab setting.  Physiatrist is providing close team supervision and 24 hour management of active medical problems listed below.  Physiatrist and rehab team continue to assess barriers to discharge/monitor patient progress toward functional and medical goals  Care Tool:  Bathing              Bathing assist       Upper Body Dressing/Undressing Upper body dressing        Upper body assist      Lower Body Dressing/Undressing Lower body dressing      What is the patient wearing?: Hospital gown only, Underwear/pull up     Lower body assist Assist for lower body dressing: Independent     Toileting Toileting    Toileting assist Assist for toileting: Contact Guard/Touching assist     Transfers Chair/bed transfer  Transfers assist           Locomotion Ambulation   Ambulation assist      Assist level: Contact Guard/Touching assist Assistive device: No Device Max distance: 10   Walk 10 feet activity  Assist     Assist level: Contact Guard/Touching assist Assistive device: No Device   Walk 50 feet activity   Assist           Walk 150 feet activity   Assist           Walk 10 feet on uneven surface  activity   Assist           Wheelchair     Assist               Wheelchair 50 feet with 2 turns activity    Assist            Wheelchair 150 feet activity     Assist          Medical Problem List and Plan: 1.  Debility secondary to pancreatic  cancer status post Whipple procedure  -beginning therapies today. Short LOS 2.  Antithrombotics: -DVT/anticoagulation:  Pharmaceutical: Lovenox             -antiplatelet therapy: N/A 3. Pain Management: Now on MSIR every 4 hours for pain.  4. Mood: LCSW to follow for evaluation and support.              -antipsychotic agents: N/A 5. Neuropsych: This patient is capable of making decisions on his own behalf. 6. Skin/Wound Care: Routine pressure relief measures 7. Fluids/Electrolytes/Nutrition: encourage PO  -I personally reviewed the patient's labs today.    8.  PNA/Hepatic necrosis: Leucocytosis has resolved---5.3 on 5/1. LFTs WNL.   -She completed antibiotic regimen 4/30 9.  ABLA:  hgb 6.7 today (had been around 7.2 recently).   -pt denied being symptomatic this morning although she is tachycardic  -re-check hgb today  -no transfusion unless further drop below 6.5 or symptomatic . - Avoid adding iron due to ongoing GI issues.  10. Stress induced hyperglycemia: At high risk to develop diabetes  -Hgb A1C 6.2.   -sugars well controlled at present 11. AKI: Renal status stable. Hyponatremia resolved  12. Tachycardia: Monitor HR tid--likely due to deconditioning and anemia.  -observe for exercise tolerance  -encourage PO fluids  13.  Malnutrition: Prealbumin-13.6 . She is attempting small meals. Continue supplements 3-4 times a day.  14. Hepatic encephalopathy: Mentation has cleared and ammonia levels down to 20  15. Urinary retention: PVR's  300+ yesterda  -continue urecholine  -oob to toilet to void, double voids  LOS: 1 days A FACE TO [place] 05/17/2018, 10:32 AM

## 2018-05-17 NOTE — Progress Notes (Signed)
Social Work  Social Work Assessment and Plan  Patient Details  Name: Jane Brooks MRN: [id number] Date of Birth: 07-07-1970  Today's Date: 05/17/2018  Problem List:  Patient Active Problem List   Diagnosis Date Noted  . Peripheral edema   . Hyperglycemia   . Acute blood loss anemia   . Thrombocytopenia ([place])   . Urinary retention   . Sinus tachycardia   . Debility 05/16/2018  . Acute respiratory failure with hypoxemia ([place]) 04/30/2018  . HCAP (healthcare-associated pneumonia) 04/30/2018  . Essential hypertension 04/30/2018  . Chronic prescription opiate use 04/30/2018  . Postoperative pain 04/30/2018  . Tachycardia 04/30/2018  . Tachypnea 04/30/2018  . Acute respiratory alkalosis 04/30/2018  . Adenocarcinoma of head of pancreas ([place]) 04/25/2018  . Genetic testing 01/24/2018  . Family history of uterine cancer   . Family history of prostate cancer   . Family history of lung cancer   . Pancreatic cancer ([place]) 11/09/2017  . Pancreatic mass   . Cholecystitis 10/30/2017  . Jaundice 10/30/2017   Past Medical History:  Past Medical History:  Diagnosis Date  . Family history of lung cancer   . Family history of prostate cancer   . Family history of uterine cancer   . Gallstones 10/2017  . Pancreatic cancer ([place])   . Pneumothorax, closed, traumatic    years ago  . PONV (postoperative nausea and vomiting)    Past Surgical History:  Past Surgical History:  Procedure Laterality Date  . BILIARY STENT PLACEMENT  10/31/2017   Procedure: BILIARY STENT PLACEMENT;  Surgeon: [name], MD;  Location: [facility] ENDOSCOPY;  Service: Gastroenterology;;  . ERCP N/A 10/31/2017   Procedure: ENDOSCOPIC RETROGRADE CHOLANGIOPANCREATOGRAPHY (ERCP);  Surgeon: [name], MD;  Location[facility] ENDOSCOPY;  Service: Gastroenterology;  Laterality: N/A;  . ESOPHAGOGASTRODUODENOSCOPY N/A 11/08/2017   Procedure: ESOPHAGOGASTRODUODENOSCOPY (EGD);  Surgeon: Milus Banister, MD;  Location: [facility]  ENDOSCOPY;  Service: Endoscopy;  Laterality: N/A;  . EUS N/A 11/08/2017   Procedure: UPPER ENDOSCOPIC ULTRASOUND (EUS) RADIAL;  Surgeon: Milus Banister, MD;  Location: [facility] ENDOSCOPY;  Service: Endoscopy;  Laterality: N/A;  . FINE NEEDLE ASPIRATION  11/08/2017   Procedure: FINE NEEDLE ASPIRATION;  Surgeon: Milus Banister, MD;  Location: [facility] ENDOSCOPY;  Service: Endoscopy;;  . IR IMAGING GUIDED PORT INSERTION  11/15/2017  . LAPAROSCOPY N/A 04/25/2018   Procedure: LAPAROSCOPY DIAGNOSTIC;  Surgeon: Stark Klein, MD;  Location: [facility];  Service: General;  Laterality: N/A;  GENERAL AND EPIDURAL ANESTHESIA  . SPHINCTEROTOMY  10/31/2017   Procedure: SPHINCTEROTOMY;  Surgeon: [name], MD;  Location: [facility] ENDOSCOPY;  Service: Gastroenterology;;  . TONSILLECTOMY    . WHIPPLE PROCEDURE N/A 04/25/2018   Procedure: WHIPPLE PROCEDURE, RECONSTRUCTION OF PORTAL VEIN;  Surgeon: Stark Klein, MD;  Location: [place];  Service: General;  Laterality: N/A;   Social History:  reports that she quit smoking about 3 weeks ago. Her smoking use included cigarettes. She has a 37.00 pack-year smoking history. She has never used smokeless tobacco. She reports previous alcohol use. She reports that she does not use drugs.  Family / Support Systems Marital Status: Single Patient Roles: Other (Comment)(friends) Other Supports: friend, Norva Karvonen @ [phone];  friend, Francella Solian @ [phone] and sister, Verne Grain (out-of-state)@ [phone] Anticipated Caregiver: friends  Ability/Limitations of Caregiver: intermittent only Caregiver Availability: Intermittent Family Dynamics: Pt notes close relationship with family, however, no one local.  Mostly friends who are her local support system.  Social History Preferred language: English Religion: None Cultural  Background: NA Education: college Read: Yes Write: Yes Employment Status: Disabled Public relations account executive Issues: None Guardian/Conservator:  None - per MD, pt is capable of making decisions on her own behalf.   Abuse/Neglect Abuse/Neglect Assessment Can Be Completed: Yes Physical Abuse: Denies Verbal Abuse: Denies Sexual Abuse: Denies Exploitation of patient/patient's resources: Denies Self-Neglect: Denies  Emotional Status Pt's affect, behavior and adjustment status: Pt very pleasant, bright affect and talkes easily about the "whirlwind"  she feels her life has been since Oct when she received CA diagnosis.  Desribes her mood as "OK" and focused on a return to independence.  Team anticipating a short LOS, howeve, will monitor mood and refer for neuropsychology as needed. Recent Psychosocial Issues: Completely independent until the end of last year. Psychiatric History: None Substance Abuse History: None  Patient / Family Perceptions, Expectations & Goals Pt/Family understanding of illness & functional limitations: Pt with very good understanding of her medical issues, debility following procedure and need for CIR. Premorbid pt/family roles/activities: Pt independent and living alone. Anticipated changes in roles/activities/participation: Little change anticipated as team has set independent goals. Pt/family expectations/goals: "I just hope I will be home by early next week.  Community Resources [employer]s: None Premorbid Home Care/DME Agencies: None Transportation available at discharge: yes Resource referrals recommended: Support group (specify)  Discharge Planning Living Arrangements: Alone Support Systems: Friends/neighbors Type of Residence: Private residence Insurance Resources: [employer] (specify county) Pensions consultant: SSD, [employer] Financial Screen Referred: No Living Expenses: Education officer, community Management: Patient Does the patient have any problems obtaining your medications?: No Home Management: pt Patient/Family Preliminary Plans: Pt to return to her home with friends able to provide intermittent  support. Social Work Anticipated Follow Up Needs: HH/OP, Support Group Expected length of stay: 5-7 days  Clinical Impression Very pleasant woman here for debility following procedure she had for CA treatment.  Very motivated and eager to regain independence and return home ASAP.  Good support from friends, however, no local family.  Denies any significant emotional distress at this time.  Will follow for support and d/c planning.  [place], [place]on 05/17/2018, 3:34 PM

## 2018-05-18 ENCOUNTER — Inpatient Hospital Stay (HOSPITAL_COMMUNITY): Payer: Medicaid Other | Admitting: Occupational Therapy

## 2018-05-18 ENCOUNTER — Inpatient Hospital Stay (HOSPITAL_COMMUNITY): Payer: Medicaid Other | Admitting: Physical Therapy

## 2018-05-18 ENCOUNTER — Inpatient Hospital Stay (HOSPITAL_COMMUNITY): Payer: Medicaid Other | Admitting: Speech Pathology

## 2018-05-18 DIAGNOSIS — R339 Retention of urine, unspecified: Secondary | ICD-10-CM

## 2018-05-18 DIAGNOSIS — D696 Thrombocytopenia, unspecified: Secondary | ICD-10-CM

## 2018-05-18 DIAGNOSIS — R000 Tachycardia, unspecified: Secondary | ICD-10-CM

## 2018-05-18 DIAGNOSIS — D62 Acute posthemorrhagic anemia: Secondary | ICD-10-CM

## 2018-05-18 LAB — GLUCOSE, CAPILLARY
Glucose-Capillary: 91 mg/dL (ref 70–99)
Glucose-Capillary: 101 mg/dL — ABNORMAL HIGH (ref 70–99)
Glucose-Capillary: 124 mg/dL — ABNORMAL HIGH (ref 70–99)
Glucose-Capillary: 106 mg/dL — ABNORMAL HIGH (ref 70–99)

## 2018-05-18 LAB — PREPARE RBC (CROSSMATCH)

## 2018-05-18 LAB — CBC
HCT: 20 % — ABNORMAL LOW (ref 36.0–46.0)
HCT: 19.6 % — ABNORMAL LOW (ref 36.0–46.0)
Hemoglobin: 6.4 g/dL — CL (ref 12.0–15.0)
Hemoglobin: 6.2 g/dL — CL (ref 12.0–15.0)
MCH: 24.2 pg — ABNORMAL LOW (ref 26.0–34.0)
MCH: 24 pg — ABNORMAL LOW (ref 26.0–34.0)
MCHC: 32 g/dL (ref 30.0–36.0)
MCHC: 31.6 g/dL (ref 30.0–36.0)
MCV: 75.8 fL — ABNORMAL LOW (ref 80.0–100.0)
MCV: 76 fL — ABNORMAL LOW (ref 80.0–100.0)
Platelets: 149 10*3/uL — ABNORMAL LOW (ref 150–400)
Platelets: 124 10*3/uL — ABNORMAL LOW (ref 150–400)
RBC: 2.64 MIL/uL — ABNORMAL LOW (ref 3.87–5.11)
RBC: 2.58 MIL/uL — ABNORMAL LOW (ref 3.87–5.11)
RDW: 21.9 % — ABNORMAL HIGH (ref 11.5–15.5)
RDW: 22 % — ABNORMAL HIGH (ref 11.5–15.5)
WBC: 5.6 10*3/uL (ref 4.0–10.5)
WBC: 5.9 10*3/uL (ref 4.0–10.5)
nRBC: 0 % (ref 0.0–0.2)
nRBC: 0 % (ref 0.0–0.2)

## 2018-05-18 MED ORDER — SODIUM CHLORIDE 0.9% IV SOLUTION
Freq: Once | INTRAVENOUS | Status: AC
  Administered 2018-05-18: 18:00:00 via INTRAVENOUS

## 2018-05-18 MED ORDER — FUROSEMIDE 10 MG/ML IJ SOLN
20.0000 mg | Freq: Once | INTRAMUSCULAR | Status: AC
  Administered 2018-05-18: 23:00:00 20 mg via INTRAVENOUS

## 2018-05-18 MED ORDER — FUROSEMIDE 20 MG PO TABS
20.0000 mg | ORAL_TABLET | Freq: Every day | ORAL | Status: DC
  Administered 2018-05-18 – 2018-05-21 (×4): 20 mg via ORAL
  Filled 2018-05-18 (×4): qty 1

## 2018-05-18 NOTE — Progress Notes (Addendum)
[place] PHYSICAL MEDICINE & REHABILITATION PROGRESS NOTE   Subjective/Complaints: Patient seen sitting up in her chair this morning.  She states she slept well overnight.  She notes she had a good first day of therapies yesterday.  She notes lower extremity edema.  ROS: + Lower extremity edema.  Denies CP, shortness of breath, nausea, vomiting, diarrhea.  Objective:   No results found. Recent Labs    05/17/18 1034 05/18/18 0349  WBC 5.1 5.6  HGB 6.9* 6.4*  HCT 21.5* 20.0*  PLT 186 149*   Recent Labs    05/16/18 0448 05/17/18 0457  NA 134* 138  K 3.5 3.5  CL 102 104  CO2 25 27  GLUCOSE 89 92  BUN <5* <5*  CREATININE 0.75 0.65  CALCIUM 8.1* 7.9*    Intake/Output Summary (Last 24 hours) at 05/18/2018 1138 Last data filed at 05/18/2018 0820 Gross per 24 hour  Intake 460 ml  Output 100 ml  Net 360 ml     Physical Exam: Vital Signs Blood pressure 129/78, pulse 96, temperature 98.7 F (37.1 C), temperature source Oral, resp. rate 19, height 4' 11"  (1.499 m), weight 63.2 kg, SpO2 99 %. Constitutional: No distress . Vital signs reviewed. HENT: Normocephalic.  Atraumatic. Eyes: EOMI. No discharge. Cardiovascular: No JVD. Respiratory: Normal effort. GI: Mildly distended Musc: Lower extremity edema Neurologic: Alert and oriented Motor: Grossly 4+/5 throughout  Skin:: +RLQ drain  Psych: pleasant and cooperative  Assessment/Plan: 1. Functional deficits secondary to debility which require 3+ hours per day of interdisciplinary therapy in a comprehensive inpatient rehab setting.  Physiatrist is providing close team supervision and 24 hour management of active medical problems listed below.  Physiatrist and rehab team continue to assess barriers to discharge/monitor patient progress toward functional and medical goals  Care Tool:  Bathing    Body parts bathed by patient: Right arm, Left upper leg, Left arm, Right lower leg, Chest, Abdomen, Left lower leg, Face,  Front perineal area, Buttocks, Right upper leg         Bathing assist Assist Level: Supervision/Verbal cueing     Upper Body Dressing/Undressing Upper body dressing   What is the patient wearing?: Hospital gown only    Upper body assist Assist Level: Independent    Lower Body Dressing/Undressing Lower body dressing      What is the patient wearing?: Incontinence brief     Lower body assist Assist for lower body dressing: Supervision/Verbal cueing     Toileting Toileting    Toileting assist Assist for toileting: Supervision/Verbal cueing     Transfers Chair/bed transfer  Transfers assist     Chair/bed transfer assist level: Supervision/Verbal cueing     Locomotion Ambulation   Ambulation assist      Assist level: Supervision/Verbal cueing Assistive device: No Device Max distance: 200'   Walk 10 feet activity   Assist     Assist level: Supervision/Verbal cueing Assistive device: No Device   Walk 50 feet activity   Assist    Assist level: Supervision/Verbal cueing Assistive device: No Device    Walk 150 feet activity   Assist    Assist level: Supervision/Verbal cueing Assistive device: No Device    Walk 10 feet on uneven surface  activity   Assist     Assist level: Contact Guard/Touching assist     Wheelchair     Assist Will patient use wheelchair at discharge?: No             Wheelchair 50 feet  with 2 turns activity    Assist            Wheelchair 150 feet activity     Assist          Medical Problem List and Plan: 1.  Debility secondary to pancreatic cancer status post Whipple procedure  Continue CIR 2.  Antithrombotics: -DVT/anticoagulation:  Pharmaceutical: Lovenox             -antiplatelet therapy: N/A 3. Pain Management: Now on MSIR every 4 hours for pain.  4. Mood: LCSW to follow for evaluation and support.              -antipsychotic agents: N/A 5. Neuropsych: This patient is  capable of making decisions on his own behalf. 6. Skin/Wound Care: Routine pressure relief measures 7. Fluids/Electrolytes/Nutrition: encourage PO 8.  PNA/Hepatic necrosis:   -She completed antibiotic regimen 4/30 9.  ABLA:    Critical value of hemoglobin, 6.4 on 5/2, will repeat labs  Labs ordered for tomorrow  Will order Hemoccult stools 10. Stress induced hyperglycemia: At high risk to develop diabetes  -Hgb A1C 6.2.   Controlled on 5/2 11. AKI: Resolved 12. Tachycardia: Monitor HR tid--likely due to deconditioning and anemia.  -observe for exercise tolerance  -encourage PO fluids   Slightly elevated on 5/2 13.  Malnutrition: Prealbumin-13.6 . She is attempting small meals. Continue supplements 3-4 times a day.  14. Hepatic encephalopathy: Mentation has cleared and ammonia level WNL  15. Urinary retention:   ?  Improving  -continue urecholine  -oob to toilet to void, double voids 16.  Thrombocytopenia  Platelets 149 on 5/2, repeat labs ordered 17.  Peripheral edema  Lasix started on 5/2  Will consider repeat echo  LOS: 2 days A FACE TO FACE EVALUATION WAS PERFORMED  Jane Brooks Jane Brooks 05/18/2018, 11:38 AM

## 2018-05-18 NOTE — Progress Notes (Signed)
Physical Therapy Session Note  Patient Details  Name: Jane Brooks MRN: [id number] Date of Birth: [birth year]  Today's Date: 05/18/2018    Short Term Goals: Week 1:  PT Short Term Goal 1 (Week 1): =LTG due to ELOS  Skilled Therapeutic Interventions/Progress Updates:   Per chart review, Hgb 6.2. PT spoke with RN, and states that pt is currenlt waiting for 2 units of PRBC. Will re-attempt at later time/date, provided pt in medically appropriate.      Therapy Documentation Precautions:  Precautions Precautions: Fall Restrictions Weight Bearing Restrictions: No Vital Signs: Therapy Vitals Temp: 98.3 F (36.8 C) Temp Source: Oral Pulse Rate: 90 Resp: 20 BP: 127/75 Patient Position (if appropriate): Lying Oxygen Therapy SpO2: 99 % O2 Device: Room Air    Therapy/Group: Individual Therapy  Lorie Phenix 05/18/2018, 5:53 PM

## 2018-05-18 NOTE — Progress Notes (Signed)
Patient states her nausea is getting worse. Patient vomited a large amount today around 1155. Patient given some more Zofran.  Nicholes Rough, LPN

## 2018-05-18 NOTE — Progress Notes (Signed)
Occupational Therapy Session Note  Patient Details  Name: Jane Brooks MRN: [id number] Date of Birth: September 12, 1970  Today's Date: 05/18/2018 OT Individual Time: 1040-1205 OT Individual Time Calculation (min): 85 min    Short Term Goals: Week 1:  OT Short Term Goal 1 (Week 1): STG=LTG due to LOS  Skilled Therapeutic Interventions/Progress Updates:    Patient seated in recliner, ready for therapy session, denies pain but notes ongoing nausea.  Patient completed oral care S level in stance. Functional ambulation and transfers without AD in room, hallways and therapy areas with CS.  Reviewed energy conservation techniques and basic HM (cooking, shopping, cleaning, laundry, pet care...) plans post discharge.  Patient created a list of items that she will follow up on in the next few days such as laundry services newly available in apartment complex and questions for MD and social worker.  She was also able to create list of ECTs that were reviewed earlier in therapy session with good accuracy.  She demonstrated ability to reach into high and low cabinets/drawers and carry items from place to place with CS.  Reviewed use of reacher for hard to reach spaces such as top-loading washer.  Completed dynamic balance/squating exercises with significant fatigue noted with 2-4 repetitions - reviewed options to avoid difficult positions such as this when completing tasks in home environment.  Patient with vomiting episode at close of session with change of position to supine - MD and nursing aware.  After clean up - patient remained seated at edge of bed with nursing present.    Therapy Documentation Precautions:  Precautions Precautions: Fall Restrictions Weight Bearing Restrictions: No General:   Vital Signs:   Pain: Pain Assessment Pain Scale: 0-10 Pain Score: 0-No pain   Other Treatments:     Therapy/Group: Individual Therapy  Carlos Levering 05/18/2018, 12:10 PM

## 2018-05-19 DIAGNOSIS — R7303 Prediabetes: Secondary | ICD-10-CM | POA: Insufficient documentation

## 2018-05-19 DIAGNOSIS — R609 Edema, unspecified: Secondary | ICD-10-CM

## 2018-05-19 DIAGNOSIS — R739 Hyperglycemia, unspecified: Secondary | ICD-10-CM

## 2018-05-19 LAB — GLUCOSE, CAPILLARY
Glucose-Capillary: 124 mg/dL — ABNORMAL HIGH (ref 70–99)
Glucose-Capillary: 89 mg/dL (ref 70–99)
Glucose-Capillary: 86 mg/dL (ref 70–99)
Glucose-Capillary: 109 mg/dL — ABNORMAL HIGH (ref 70–99)

## 2018-05-19 LAB — CBC WITH DIFFERENTIAL/PLATELET
Abs Immature Granulocytes: 0.02 10*3/uL (ref 0.00–0.07)
Basophils Absolute: 0 10*3/uL (ref 0.0–0.1)
Basophils Relative: 0 %
Eosinophils Absolute: 0.1 10*3/uL (ref 0.0–0.5)
Eosinophils Relative: 2 %
HCT: 27.7 % — ABNORMAL LOW (ref 36.0–46.0)
Hemoglobin: 9.2 g/dL — ABNORMAL LOW (ref 12.0–15.0)
Immature Granulocytes: 0 %
Lymphocytes Relative: 23 %
Lymphs Abs: 1.6 10*3/uL (ref 0.7–4.0)
MCH: 25.6 pg — ABNORMAL LOW (ref 26.0–34.0)
MCHC: 33.2 g/dL (ref 30.0–36.0)
MCV: 76.9 fL — ABNORMAL LOW (ref 80.0–100.0)
Monocytes Absolute: 0.8 10*3/uL (ref 0.1–1.0)
Monocytes Relative: 12 %
Neutro Abs: 4.2 10*3/uL (ref 1.7–7.7)
Neutrophils Relative %: 63 %
Platelets: 124 10*3/uL — ABNORMAL LOW (ref 150–400)
RBC: 3.6 MIL/uL — ABNORMAL LOW (ref 3.87–5.11)
RDW: 19.7 % — ABNORMAL HIGH (ref 11.5–15.5)
WBC: 6.7 10*3/uL (ref 4.0–10.5)
nRBC: 0 % (ref 0.0–0.2)

## 2018-05-19 LAB — TYPE AND SCREEN
ABO/RH(D): A POS
Antibody Screen: NEGATIVE
Unit division: 0
Unit division: 0

## 2018-05-19 LAB — BPAM RBC
Blood Product Expiration Date: 202005062359
Blood Product Expiration Date: 202005062359
ISSUE DATE / TIME: 202005021814
ISSUE DATE / TIME: 202005022242
Unit Type and Rh: 6200
Unit Type and Rh: 6200

## 2018-05-19 LAB — LIPASE, FLUID: Lipase-Fluid: <3 U/L

## 2018-05-19 NOTE — Progress Notes (Signed)
[place] PHYSICAL MEDICINE & REHABILITATION PROGRESS NOTE   Subjective/Complaints: Patient seen laying in bed this morning.  She states she slept fairly overnight because of how long the transfusion lasted.  She notes improvement in nausea.  ROS: Denies CP, shortness of breath, nausea, vomiting, diarrhea.  Objective:   No results found. Recent Labs    05/18/18 1510 05/19/18 0508  WBC 5.9 6.7  HGB 6.2* 9.2*  HCT 19.6* 27.7*  PLT 124* 124*   Recent Labs    05/17/18 0457  NA 138  K 3.5  CL 104  CO2 27  GLUCOSE 92  BUN <5*  CREATININE 0.65  CALCIUM 7.9*    Intake/Output Summary (Last 24 hours) at 05/19/2018 1140 Last data filed at 05/19/2018 0730 Gross per 24 hour  Intake 1572 ml  Output -  Net 1572 ml     Physical Exam: Vital Signs Blood pressure 132/75, pulse 81, temperature 98.6 F (37 C), temperature source Oral, resp. rate 16, height 4' 11"  (1.499 m), weight 59.2 kg, SpO2 96 %. Constitutional: No distress . Vital signs reviewed. HENT: Normocephalic.  Atraumatic. Eyes: EOMI.  No discharge. Cardiovascular: No JVD. Respiratory: Normal effort. GI: Mildly distended Musc: Lower extremity edema, improving Neurologic: Alert and oriented Motor: Grossly 4+/5 throughout, stable Skin:: +RLQ drain  Psych: pleasant and cooperative  Assessment/Plan: 1. Functional deficits secondary to debility which require 3+ hours per day of interdisciplinary therapy in a comprehensive inpatient rehab setting.  Physiatrist is providing close team supervision and 24 hour management of active medical problems listed below.  Physiatrist and rehab team continue to assess barriers to discharge/monitor patient progress toward functional and medical goals  Care Tool:  Bathing    Body parts bathed by patient: Right arm, Left upper leg, Left arm, Right lower leg, Chest, Abdomen, Left lower leg, Face, Front perineal area, Buttocks, Right upper leg         Bathing assist Assist Level:  Supervision/Verbal cueing     Upper Body Dressing/Undressing Upper body dressing   What is the patient wearing?: Hospital gown only    Upper body assist Assist Level: Independent    Lower Body Dressing/Undressing Lower body dressing      What is the patient wearing?: Pants, Underwear/pull up     Lower body assist Assist for lower body dressing: Independent     Toileting Toileting    Toileting assist Assist for toileting: Independent     Transfers Chair/bed transfer  Transfers assist     Chair/bed transfer assist level: Supervision/Verbal cueing     Locomotion Ambulation   Ambulation assist      Assist level: Supervision/Verbal cueing Assistive device: No Device Max distance: 200'   Walk 10 feet activity   Assist     Assist level: Supervision/Verbal cueing Assistive device: No Device   Walk 50 feet activity   Assist    Assist level: Supervision/Verbal cueing Assistive device: No Device    Walk 150 feet activity   Assist    Assist level: Supervision/Verbal cueing Assistive device: No Device    Walk 10 feet on uneven surface  activity   Assist     Assist level: Contact Guard/Touching assist     Wheelchair     Assist Will patient use wheelchair at discharge?: No             Wheelchair 50 feet with 2 turns activity    Assist            Wheelchair 150 feet  activity     Assist          Medical Problem List and Plan: 1.  Debility secondary to pancreatic cancer status post Whipple procedure  Continue CIR 2.  Antithrombotics: -DVT/anticoagulation:  Pharmaceutical: Lovenox             -antiplatelet therapy: N/A 3. Pain Management: Now on MSIR every 4 hours for pain.  4. Mood: LCSW to follow for evaluation and support.              -antipsychotic agents: N/A 5. Neuropsych: This patient is capable of making decisions on his own behalf. 6. Skin/Wound Care: Routine pressure relief measures 7.  Fluids/Electrolytes/Nutrition: encourage PO 8.  PNA/Hepatic necrosis:   -She completed antibiotic regimen 4/30 9.  ABLA:    Critical value of hemoglobin, 6.4 on 5/2, repeat value 6.2 on 5/2, 9.2 on 5/3 after transfusion of 2 units  Labs ordered for tomorrow  Will order Hemoccult stools 10. Stress induced hyperglycemia: At high risk to develop diabetes  -Hgb A1C 6.2.   Slightly elevated on 5/3 11. AKI: Resolved 12. Tachycardia: Monitor HR tid--likely due to deconditioning and anemia.  -observe for exercise tolerance  -encourage PO fluids   Controlled on 5/3 13.  Malnutrition: Prealbumin-13.6 . She is attempting small meals. Continue supplements 3-4 times a day.  14. Hepatic encephalopathy: Mentation has cleared and ammonia level WNL  15. Urinary retention:   Improving  -continue urecholine  -oob to toilet to void, double voids 16.  Thrombocytopenia  Platelets 124 5/3, labs ordered for tomorrow  May need to consider further work-up with platelets continue to trend down tomorrow 17.  Peripheral edema  Lasix started on 5/2  Improving  Will consider repeat echo if necessary  LOS: 3 days A FACE TO FACE EVALUATION WAS PERFORMED  [place] Catarino Lorie Phenix 05/19/2018, 11:40 AM

## 2018-05-20 ENCOUNTER — Inpatient Hospital Stay (HOSPITAL_COMMUNITY): Payer: Medicaid Other | Admitting: Occupational Therapy

## 2018-05-20 ENCOUNTER — Inpatient Hospital Stay (HOSPITAL_COMMUNITY): Payer: Medicaid Other | Admitting: Physical Therapy

## 2018-05-20 LAB — BASIC METABOLIC PANEL
Anion gap: 7 (ref 5–15)
BUN: 5 mg/dL — ABNORMAL LOW (ref 6–20)
CO2: 29 mmol/L (ref 22–32)
Calcium: 8.1 mg/dL — ABNORMAL LOW (ref 8.9–10.3)
Chloride: 102 mmol/L (ref 98–111)
Creatinine, Ser: 0.66 mg/dL (ref 0.44–1.00)
GFR calc Af Amer: >60 mL/min (ref >60)
GFR calc non Af Amer: >60 mL/min (ref >60)
Glucose, Bld: 95 mg/dL (ref 70–99)
Potassium: 3.7 mmol/L (ref 3.5–5.1)
Sodium: 138 mmol/L (ref 135–145)

## 2018-05-20 LAB — CBC WITH DIFFERENTIAL/PLATELET
Abs Immature Granulocytes: 0.02 10*3/uL (ref 0.00–0.07)
Basophils Absolute: 0 10*3/uL (ref 0.0–0.1)
Basophils Relative: 0 %
Eosinophils Absolute: 0.1 10*3/uL (ref 0.0–0.5)
Eosinophils Relative: 2 %
HCT: 26.4 % — ABNORMAL LOW (ref 36.0–46.0)
Hemoglobin: 8.7 g/dL — ABNORMAL LOW (ref 12.0–15.0)
Immature Granulocytes: 0 %
Lymphocytes Relative: 16 %
Lymphs Abs: 1.2 10*3/uL (ref 0.7–4.0)
MCH: 25.4 pg — ABNORMAL LOW (ref 26.0–34.0)
MCHC: 33 g/dL (ref 30.0–36.0)
MCV: 77.2 fL — ABNORMAL LOW (ref 80.0–100.0)
Monocytes Absolute: 0.8 10*3/uL (ref 0.1–1.0)
Monocytes Relative: 10 %
Neutro Abs: 5.4 10*3/uL (ref 1.7–7.7)
Neutrophils Relative %: 72 %
Platelets: 103 10*3/uL — ABNORMAL LOW (ref 150–400)
RBC: 3.42 MIL/uL — ABNORMAL LOW (ref 3.87–5.11)
RDW: 20 % — ABNORMAL HIGH (ref 11.5–15.5)
WBC: 7.6 10*3/uL (ref 4.0–10.5)
nRBC: 0 % (ref 0.0–0.2)

## 2018-05-20 LAB — GLUCOSE, CAPILLARY
Glucose-Capillary: 86 mg/dL (ref 70–99)
Glucose-Capillary: 99 mg/dL (ref 70–99)
Glucose-Capillary: 88 mg/dL (ref 70–99)
Glucose-Capillary: 68 mg/dL — ABNORMAL LOW (ref 70–99)
Glucose-Capillary: 78 mg/dL (ref 70–99)

## 2018-05-20 MED ORDER — FREE WATER
100.0000 mL | Freq: Two times a day (BID) | Status: DC
  Administered 2018-05-21: 100 mL

## 2018-05-20 MED ORDER — FREE WATER: 100.0000 mL | Freq: Three times a day (TID) | Status: DC

## 2018-05-20 NOTE — Progress Notes (Signed)
Occupational Therapy Session Note  Patient Details  Name: Jane Brooks MRN: [id number] Date of Birth: 1970-07-04  Today's Date: 05/20/2018 OT Individual Time: 6333-5456 OT Individual Time Calculation (min): 43 min    Short Term Goals: Week 1:  OT Short Term Goal 1 (Week 1): STG=LTG due to LOS  Skilled Therapeutic Interventions/Progress Updates:    Upon entering the room, pt supine in bed with no c/o pain this session. Pt declines OOB activity secondary to increased drainage from abdominal wound with mobility. Pt has been made mod I in room as well. OT continued education regarding energy conservation and safety within the home. Pt verbalized feeling ready for discharge and no further questions at this time.   Therapy Documentation Precautions:  Precautions Precautions: Fall Restrictions Weight Bearing Restrictions: No General:   Vital Signs: Therapy Vitals Temp: 99.6 F (37.6 C) Temp Source: Oral Pulse Rate: 90 Resp: 18 BP: 137/79 Patient Position (if appropriate): Lying Oxygen Therapy SpO2: 100 % O2 Device: Room Air  Vision Baseline Vision/History: Wears glasses(Contacts) Wears Glasses: At all times Patient Visual Report: No change from baseline Vision Assessment?: No apparent visual deficits Perception  Perception: Within Functional Limits Praxis Praxis: Intact Exercises:   Other Treatments:     Therapy/Group: Individual Therapy  Gypsy Decant 05/20/2018, 4:08 PM

## 2018-05-20 NOTE — Progress Notes (Signed)
Nutrition Follow-up  DOCUMENTATION CODES:   Not applicable  INTERVENTION:   - d/c Ensure Enlive  - Magic Cup BID with meals, each supplement provides 290 kcal and 9 grams of protein  - Double protein portions TID with meals, RD ordered via [place]  - Encourage adequate PO intake  - Provided education regarding consistent carbohydrate intake and high-protein nutrition therapy and attached handout to discharge instructions  NUTRITION DIAGNOSIS:   Increased nutrient needs related to post-op healing, cancer and cancer related treatments as evidenced by estimated needs.  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 Pt continued to have significant upper abdominal pain, nausea, and weight loss. Pt admitted on 04/9 for Whipple with portal vein reconstruction, placement of pancreatic stent, and jejunostomy feeding tube. Post-op course complicated by hypotension with AKI, shock liver, tachycardia, and SIRS. Pt tolerated extubation on 04/26/18 and AKI resolved with hydration. On 4/12, pt started developing increased WOB and was started on abx for HCAP but continued to have fevers with poor pain control as well as elevated levels of anxiety requiring reintubation 4/15. CT abdomen/pelvis showed multilobar PNA with 9 cm lobulated density in right hepatic lobe and thickening of right and proximal transverse colon concerning for colitis. MRI abdomen showed hepatic necrosis and pt was treated with FFP for thrombocytopenia and hepatic encephalopathy resolving with addition of lactulose. Pt tolerated extubation by 4/25 and diet  slowly advanced to regular. Admitted to CIR on 4/30.  Noted pt with episode of emesis this morning after breakfast. Per MD, Reglan has helped pt with nausea and PO tolerance. Also noted JP drain to right abdomen was removed today by Surgery and that pt had serous output after removal.  Spoke with pt at bedside. Pt reports plan is for her to discharge tomorrow. Pt states that she is looking forward to seeing her dog when she gets home.  Pt states that she does not like any of the oral nutrition supplements and that the Ensure Enlive in particular always causes her to throw up. RD to d/c Ensure Enlive order.  Weight down 14 lbs since admission to CIR. Suspect this is related to fluid status as pt has been on Lasix for LE edema.  Pt reports that her current weight is consistent with her weight PTA and that she has been working hard to maintain her weight throughout chemotherapy and post-op.  RD was originally consulted for nutrition education regarding carbohydrate modified diet. Discussed different food groups and their effects on blood sugar, emphasizing carbohydrate-containing foods. Discussed importance of controlled and consistent carbohydrate intake throughout the day. Provided examples of ways to balance meals/snacks and encouraged intake of high-fiber, whole grain complex carbohydrates.  Pt reports that she drank a lot of soda and juice PTA and that she knows she cannot continue doing that. RD encouraged pt to consume mostly water for hydration needs. Discussed ways to add flavor to water without adding sugar including Crystal Lite powders.  RD attached "High-Calorie High-Protein Nutrition Therapy" handout from the Academy of [employer] to pt's discharge instructions. Provided examples on ways to increase caloric density of foods and beverages frequently consumed by  the patient. Also provided ideas to promote variety and to incorporate additional nutrient dense foods into patient's  diet. Discussed eating small frequent meals and snacks to assist in increasing overall po intake. Teach back method used.  Expect good compliance.  Meal Completion: 0-100% x last 7 meals (extremely varied)  Medications reviewed and include: Ensure Enlive TID, free water 100 ml BID via J-tube, Lasix 20 mg daily, SSI, Creon 36,000 units TID with meals, Protonix, K-dur 20 mEq BID  Labs reviewed: hemoglobin 8.7 (L) CBG's: 99, 86, 109, 86 x 24 hours  Diet Order:   Diet Order            Diet regular Room service appropriate? Yes; Fluid consistency: Thin  Diet effective now              EDUCATION NEEDS:   Not appropriate for education at this time  Skin:  Skin Assessment: Skin Integrity Issues: Incisions: abdomen  Last BM:  05/19/18  Height:   Ht Readings from Last 1 Encounters:  05/16/18 4' 11"  (1.499 m)    Weight:   Wt Readings from Last 1 Encounters:  05/20/18 57.7 kg    Ideal Body Weight:  43.2 kg  BMI:  Body mass index is 25.69 kg/m.  Estimated Nutritional Needs:   Kcal:  1700-1900  Protein:  85-100 grams  Fluid:  1.7-1.9 L    Gaynell Face, MS, RD, LDN Inpatient Clinical Dietitian Pager: [phone] Weekend/After Hours: [phone]

## 2018-05-20 NOTE — Progress Notes (Signed)
[facility] PHYSICAL MEDICINE & REHABILITATION PROGRESS NOTE   Subjective/Complaints: Feeling well. Nausea better (however vomitted about 15 minutes after I saw her).   ROS: Patient denies fever, rash, sore throat, blurred vision, diarrhea, cough, shortness of breath or chest pain, joint or back pain, headache, or mood change.    Objective:   No results found. Recent Labs    05/18/18 1510 05/19/18 0508  WBC 5.9 6.7  HGB 6.2* 9.2*  HCT 19.6* 27.7*  PLT 124* 124*   Recent Labs    05/20/18 0445  NA 138  K 3.7  CL 102  CO2 29  GLUCOSE 95  BUN 5*  CREATININE 0.66  CALCIUM 8.1*    Intake/Output Summary (Last 24 hours) at 05/20/2018 0952 Last data filed at 05/20/2018 0839 Gross per 24 hour  Intake 600 ml  Output 450 ml  Net 150 ml     Physical Exam: Vital Signs Blood pressure 127/76, pulse 87, temperature 99 F (37.2 C), temperature source Oral, resp. rate 17, height 4' 11"  (1.499 m), weight 57.7 kg, SpO2 95 %. Constitutional: No distress . Vital signs reviewed. HEENT: EOMI, oral membranes moist Neck: supple Cardiovascular: RRR without murmur. No JVD    Respiratory: CTA Bilaterally without wheezes or rales. Normal effort    GI: BS +, non-tender, non-distended  Musc: Lower extremity edema, improving Neurologic: Alert and oriented Motor: Grossly 4+/5 throughout, stable Skin:: +RLQ drain  Psych: pleasant and cooperative  Assessment/Plan: 1. Functional deficits secondary to debility which require 3+ hours per day of interdisciplinary therapy in a comprehensive inpatient rehab setting.  Physiatrist is providing close team supervision and 24 hour management of active medical problems listed below.  Physiatrist and rehab team continue to assess barriers to discharge/monitor patient progress toward functional and medical goals  Care Tool:  Bathing    Body parts bathed by patient: Right arm, Left upper leg, Left arm, Right lower leg, Chest, Abdomen, Left lower leg,  Face, Front perineal area, Buttocks, Right upper leg         Bathing assist Assist Level: Independent     Upper Body Dressing/Undressing Upper body dressing   What is the patient wearing?: Pull over shirt    Upper body assist Assist Level: Independent    Lower Body Dressing/Undressing Lower body dressing      What is the patient wearing?: Underwear/pull up, Pants     Lower body assist Assist for lower body dressing: Independent     Toileting Toileting    Toileting assist Assist for toileting: Independent with assistive device Assistive Device Comment: grab bar   Transfers Chair/bed transfer  Transfers assist     Chair/bed transfer assist level: Independent     Locomotion Ambulation   Ambulation assist      Assist level: Supervision/Verbal cueing Assistive device: No Device Max distance: 200'   Walk 10 feet activity   Assist     Assist level: Supervision/Verbal cueing Assistive device: No Device   Walk 50 feet activity   Assist    Assist level: Supervision/Verbal cueing Assistive device: No Device    Walk 150 feet activity   Assist    Assist level: Supervision/Verbal cueing Assistive device: No Device    Walk 10 feet on uneven surface  activity   Assist     Assist level: Contact Guard/Touching assist     Wheelchair     Assist Will patient use wheelchair at discharge?: No  Wheelchair 50 feet with 2 turns activity    Assist            Wheelchair 150 feet activity     Assist          Medical Problem List and Plan: 1.  Debility secondary to pancreatic cancer status post Whipple procedure  Continue CIR---dc tomorrow pending labs, surgical plan  -JP right abdomen with clear/serous output---remove today?? 2.  Antithrombotics: -DVT/anticoagulation:  Pharmaceutical: Lovenox             -antiplatelet therapy: N/A 3. Pain Management: Now on MSIR every 4 hours for pain.  4. Mood: LCSW to  follow for evaluation and support.              -antipsychotic agents: N/A 5. Neuropsych: This patient is capable of making decisions on his own behalf. 6. Skin/Wound Care: Routine pressure relief measures 7. Fluids/Electrolytes/Nutrition: encourage PO 8.  PNA/Hepatic necrosis:   -She completed antibiotic regimen 4/30 9.  ABLA:      9.2 on 5/3 after transfusion of 2 units  Labs pending today 10. Stress induced hyperglycemia: At high risk to develop diabetes  -Hgb A1C 6.2.   Slightly elevated on 5/3 11. AKI: Resolved 12. Tachycardia: Monitor HR tid--likely due to deconditioning and anemia.  -observe for exercise tolerance  -encourage PO fluids   Controlled on 5/4 13.  Malnutrition: Prealbumin-13.6 . She is attempting small meals. Continue supplements 3-4 times a day.   -reglan has helped with nausea, tolerance of PO 14. Hepatic encephalopathy: Ment[place] has cleared and ammonia level WNL  15. Urinary retention:   Improving  -continue urecholine  -oob to toilet to void, double voids 16.  Thrombocytopenia  Platelets 124 5/3, labs pending  Have been trending down 17.  Peripheral edema  Lasix started on 5/2  Improving, nutritionally related most likely    LOS: 4 days A FACE TO [place] 05/20/2018, 9:52 AM

## 2018-05-20 NOTE — Care Management (Signed)
[facility] Individual Statement of Services  Patient Name:  Jane Brooks  Date:  05/20/2018  Welcome to the [facility].  Our goal is to provide you with an individualized program based on your diagnosis and situation, designed to meet your specific needs.  With this comprehensive rehabilitation program, you will be expected to participate in at least 3 hours of rehabilitation therapies Monday-Friday, with modified therapy programming on the weekends.  Your rehabilitation program will include the following services:  Physical Therapy (PT), Occupational Therapy (OT), 24 hour per day rehabilitation nursing, Case Management (Social Worker), Rehabilitation Medicine, Nutrition Services and Pharmacy Services  Weekly team conferences will be held on Tuesdays to discuss your progress.  Your Social Worker will talk with you frequently to get your input and to update you on team discussions.  Team conferences with you and your family in attendance may also be held.  Expected length of stay: 5-7 days   Overall anticipated outcome: independent  Depending on your progress and recovery, your program may change. Your Social Worker will coordinate services and will keep you informed of any changes. Your Social Worker's name and contact numbers are listed  below.  The following services may also be recommended but are not provided by the [facility] will be made to provide these services after discharge if needed.  Arrangements include referral to agencies that provide these services.  Your insurance has been verified to be:  Medicaid Your primary doctor is:  NA  Pertinent information will be shared with your doctor and your insurance company.  Social Worker:  [place], [place] or ([phone]   Information discussed  with and copy given to patient by: Lennart Pall, 05/20/2018, 3:37 PM

## 2018-05-20 NOTE — Progress Notes (Signed)
Occupational Therapy Session Note  Patient Details  Name: Jane Brooks MRN: [id number] Date of Birth: 1970/06/30  Today's Date: 05/20/2018 OT Individual Time: 3559-7416 and [id number] and 1430-1450 OT Individual Time Calculation (min): 55 min and 15 min and 20 min OT Missed Time: 10 min (nursing/ abdominal drainage)   Short Term Goals: Week 1:  OT Short Term Goal 1 (Week 1): STG=LTG due to LOS  Skilled Therapeutic Interventions/Progress Updates:    Session One: Pt seen for OT ADL bathing/dressing session. Pt sitting up in recliner upon arrival, having finished breakfast and agreeable to tx session. She denied any pain or nausea this session. She ambulated throughout room with distant supervision-mod I level, no AD. She gathered clothing items from drawers in prep for bathing task. Bathing/dressing completed from chair at sink, standing mod I to complete pericare/buttock hygiene. UB/LB bathing and dressing tasks completed mod I.  Following seated rest break, she ambulated to therapy gym, mod I. Completed floor transfer in simulation of fall recovery. Following demonstration for technique, she return demonstrated x2 trials getting up from floor. One trial using therapy mat to assist, second trial able to stand from floor without need of external aid. Educated regarding to do in case of fall including calling 911 if injury is suspected.  She ambulated back to room at end of session, left seated in recliner with all needs in reach and RN present.  Throughout session, education provided regarding energy conservation techniques to incorporate into ADLs and IADLs, DME, continuum of care, and d/c planning.  Session Two: Therapist arrived at 1330 for scheduled OT session. Pt reporting increased drainage from abdominal site where drain was pulled just prior to therapist's arrival. Pt hesitant for activity with dear of increased drainage. She requested therapist to return later for mobility portion of  session. Education provided this session regarding d/c planning, continuum of care, pt progress, return to activity and d/c planning.  Spoke with MD, still awaiting lab values for medical clearance for d/c from IPR. Pt is at goal level for therapy and to be d/c from therapy programs following today's sessions, pt in agreement. Pt left in supine and therapist to return at later time.   Session Three: Therapist returned at 1430, RN present changing pt's abdominal dressing and pt willing to participate in tx session as able. She ambulated throughout unit mod I, no AD. In therapy kitchen, she completed kitchen mobility task of obtaining items from low shelf and boiling water, transporting water pot safely throughout kitchen. While waiting for water to boil, she used broom and dust pan to sweep kitchen, completed mod I.  Pt then with increase in abdominal draining and requesting to return to room in supine to control draining. Pt left in supine with all needs in reach, declined alerting RN at this time.   Therapy Documentation Precautions:  Precautions Precautions: Fall Restrictions Weight Bearing Restrictions: No   Therapy/Group: Individual Therapy  Antolin Belsito L 05/20/2018, 7:01 AM

## 2018-05-20 NOTE — Progress Notes (Signed)
Pt vomited 400cc green fluid with breakfast food undigested. Pt had just taken morning meds and laid down, not flat in bed. She states she had some slight nausea after swallowing meds, and wanted to lay down, after getting in bed with HOB bed up, she sat back up and vomited.

## 2018-05-20 NOTE — Progress Notes (Signed)
Occupational Therapy Discharge Summary  Patient Details  Name: Jane Brooks MRN: [id number] Date of Birth: [birth year]    Patient has met 12 of 12 long term goals due to improved activity tolerance, improved balance, postural control and improved coordination.  Patient to discharge at overall Independent level.  Patient to d/c home independently. She reports friends/family can provide PRN assist at d/c. Pt completes functional ambulation and ADL/IADL tasks at independent level without AD. Extensive education provided throughout rehab admission regarding energy conservation techniques and techniques for reducing fall risk. Pt voices feeling comfortable and confident with planned d/c home independently.   Recommendation:  Patient will benefit from ongoing skilled OT services in home health setting to continue to advance functional skills in the area of iADL.  Equipment: Tub transfer bench  Reasons for discharge: treatment goals met and discharge from hospital  Patient/family agrees with progress made and goals achieved: Yes  OT Discharge Precautions/Restrictions  Precautions Precautions: Fall Restrictions Weight Bearing Restrictions: No Vision Baseline Vision/History: Wears glasses(Contacts) Wears Glasses: At all times Patient Visual Report: No change from baseline Vision Assessment?: No apparent visual deficits Perception  Perception: Within Functional Limits Praxis Praxis: Intact Cognition Overall Cognitive Status: Within Functional Limits for tasks assessed Arousal/Alertness: Awake/alert Orientation Level: Oriented X4 Focused Attention: Appears intact Sustained Attention: Appears intact Memory: Appears intact Awareness: Appears intact Problem Solving: Appears intact Safety/Judgment: Appears intact Sensation Sensation Light Touch: Appears Intact Proprioception: Appears Intact Coordination Gross Motor Movements are Fluid and Coordinated: Yes Fine Motor Movements are  Fluid and Coordinated: Yes Motor  Motor Motor: Within Functional Limits Trunk/Postural Assessment  Cervical Assessment Cervical Assessment: Within Functional Limits Thoracic Assessment Thoracic Assessment: Within Functional Limits Lumbar Assessment Lumbar Assessment: Within Functional Limits Postural Control Postural Control: Within Functional Limits  Balance Balance Balance Assessed: Yes Static Sitting Balance Static Sitting - Balance Support: No upper extremity supported;Feet supported Static Sitting - Level of Assistance: 7: Independent Dynamic Sitting Balance Dynamic Sitting - Balance Support: No upper extremity supported;Feet supported;During functional activity Dynamic Sitting - Level of Assistance: 7: Independent Static Standing Balance Static Standing - Balance Support: No upper extremity supported;During functional activity Static Standing - Level of Assistance: 7: Independent Dynamic Standing Balance Dynamic Standing - Balance Support: No upper extremity supported;During functional activity Dynamic Standing - Level of Assistance: 7: Independent Extremity/Trunk Assessment RUE Assessment RUE Assessment: Within Functional Limits LUE Assessment LUE Assessment: Within Functional Limits   Rounds, Amy L 05/20/2018, 3:14 PM

## 2018-05-20 NOTE — Progress Notes (Signed)
Physical Therapy Session Note  Patient Details  Name: Jane Brooks MRN: [id number] Date of Birth: 24-Oct-1970  Today's Date: 05/20/2018 PT Individual Time: 1000-1110 PT Individual Time Calculation (min): 70 min   Short Term Goals: Week 1:  PT Short Term Goal 1 (Week 1): =LTG due to ELOS  Skilled Therapeutic Interventions/Progress Updates:    Pt received seated EOB in room, agreeable to PT session. No complaints of pain until end of session when pt has onset of abdominal pain, RN notified. Pt transfers independently throughout session without use of AD. Ambulation x 200 ft with no AD mod I for increased time. Car transfer independent. Ascend/descend 12 stairs with L handrail mod IMerrilee Jansky Balance Test: 54/56, improved from 34/56 on 5/1, low fall risk. Floor transfer mod I. Dynamic standing balance: forwards/backwards amb with ball toss; sidesteps with ball toss; side step with cone taps; picking up cones from floor while ambulating. Pt has onset of nausea/vomiting at end of session, provided pt with ginger ale. Pt left seated in bed with needs in reach at end of session. Pt ok to be independent in her room, safety plan updated.  Therapy Documentation Precautions:  Precautions Precautions: Fall Restrictions Weight Bearing Restrictions: No    Therapy/Group: Individual Therapy   Excell Seltzer, PT, DPT  05/20/2018, 12:38 PM

## 2018-05-20 NOTE — Progress Notes (Signed)
   Subjective/Chief Complaint: Doing better.  Has been working with therapy.  Intermittently throws up.     Objective: Vital signs in last 24 hours: Temp:  [98.1 F (36.7 C)-99 F (37.2 C)] 99 F (37.2 C) (05/04 0451) Pulse Rate:  [87-93] 87 (05/04 0451) Resp:  [17-18] 17 (05/04 0451) BP: (127-139)/(76-84) 127/76 (05/04 0451) SpO2:  [95 %-100 %] 95 % (05/04 0451) Weight:  [57.7 kg] 57.7 kg (05/04 0451) Last BM Date: 05/19/18  Intake/Output from previous day: 05/03 0701 - 05/04 0700 In: 300 [P.O.:300] Out: 50 [Drains:50] Intake/Output this shift: Total I/O In: 480 [P.O.:480] Out: 400 [Emesis/NG output:400]  General appearance: cooperative, alert.  OOB Eyes: no icterus Resp: breathing comfortably GI: soft, incision healed, drain serous.  Lipase came back low.   Extremities: calves soft  Lab Results:  Recent Labs    05/18/18 1510 05/19/18 0508  WBC 5.9 6.7  HGB 6.2* 9.2*  HCT 19.6* 27.7*  PLT 124* 124*   BMET Recent Labs    05/20/18 0445  NA 138  K 3.7  CL 102  CO2 29  GLUCOSE 95  BUN 5*  CREATININE 0.66  CALCIUM 8.1*   PT/INR No results for input(s): LABPROT, INR in the last 72 hours. ABG No results for input(s): PHART, HCO3 in the last 72 hours.  Invalid input(s): PCO2, PO2  Studies/Results: No results found.  Anti-infectives: Anti-infectives (From admission, onward)   Start     Dose/Rate Route Frequency Ordered Stop   05/16/18 2100  vancomycin (VANCOCIN) IVPB 750 mg/150 ml premix     750 mg 150 mL/hr over 60 Minutes Intravenous Every 12 hours 05/16/18 1604 05/16/18 2215   05/16/18 1630  piperacillin-tazobactam (ZOSYN) IVPB 3.375 g     3.375 g 12.5 mL/hr over 240 Minutes Intravenous Every 8 hours 05/16/18 1604 05/16/18 2225      Assessment/Plan: POD 19 Whipple with portal vein reconstruction for adenoca of panc head.  1 LN positive and vein margin positive.     Continue to encourage diet. Will d/c right sided drain. Will need  flushes via J tube at home.  This will come out around 6 weeks post op.      LOS: 4 days    Stark Klein 05/20/2018

## 2018-05-20 NOTE — Progress Notes (Signed)
Pt called out that her drain to R abd was just removed by someone (PA/NP), pt states she got up to go to bathroom and large amount of drainage started coming out of site. Pale yellow drainage noted on the small 2x2 folded gauze over opening and leaking out from the dressing edges.  Area cleaned and 4x4 gauzes applied with hypafix tape to area.  Instructed pt to rest in bed for now.

## 2018-05-20 NOTE — Progress Notes (Signed)
Physical Therapy Discharge Summary  Patient Details  Name: Jane Brooks MRN: [id number] Date of Birth: 05-Jun-1970  Today's Date: 05/20/2018   Patient has met 7 of 7 long term goals due to improved activity tolerance, improved balance, improved postural control and improved coordination.  Patient to discharge at an ambulatory level Modified Independent.   Patient's care partner is not necessary as patient is at mod I level with functional mobility.  Reasons goals not met: Pt has met all rehab goals.  Recommendation:  Patient will benefit from ongoing skilled PT services in home health setting to continue to advance safe functional mobility, address ongoing impairments in balance, functional mobility, safety, and minimize fall risk.  Equipment: No equipment provided  Reasons for discharge: treatment goals met and discharge from hospital  Patient/family agrees with progress made and goals achieved: Yes  PT Discharge Precautions/Restrictions Precautions Precautions: Fall Restrictions Weight Bearing Restrictions: No Vision/Perception  Vision - History Baseline Vision: Wears contacts Perception Perception: Within Functional Limits Praxis Praxis: Intact  Cognition Overall Cognitive Status: Within Functional Limits for tasks assessed Arousal/Alertness: Awake/alert Orientation Level: Oriented X4 Attention: Focused;Sustained Focused Attention: Appears intact Sustained Attention: Appears intact Memory: Appears intact Awareness: Appears intact Problem Solving: Appears intact Safety/Judgment: Appears intact Sensation Sensation Light Touch: Appears Intact Proprioception: Appears Intact Coordination Gross Motor Movements are Fluid and Coordinated: Yes Fine Motor Movements are Fluid and Coordinated: Yes Heel Shin Test: intact B Motor  Motor Motor: Within Functional Limits Motor - Discharge Observations: improved since eval  Mobility Bed Mobility Bed Mobility: Rolling  Right;Rolling Left;Supine to Sit;Sit to Supine Rolling Right: Independent Rolling Left: Independent Supine to Sit: Independent Sit to Supine: Independent Transfers Transfers: Sit to [employer] Transfers Sit to Stand: Independent Stand Pivot Transfers: Independent Transfer (Assistive device): None Locomotion  Gait Ambulation: Yes Gait Assistance: Independent with assistive device Gait Distance (Feet): 200 Feet Assistive device: Other (Comment)(increased time) Gait Gait: Yes Gait Pattern: Within Functional Limits Gait Pattern: Within Functional Limits Gait velocity: decreased Stairs / Additional Locomotion Stairs: Yes Stairs Assistance: Independent with assistive device Stair Management Technique: Alternating pattern;One rail Left Number of Stairs: 12 Height of Stairs: 6 Ramp: Independent Curb: Independent with assistive device Wheelchair Mobility Wheelchair Mobility: No  Trunk/Postural Assessment  Cervical Assessment Cervical Assessment: Within Functional Limits Thoracic Assessment Thoracic Assessment: Within Functional Limits Lumbar Assessment Lumbar Assessment: Within Functional Limits Postural Control Postural Control: Within Functional Limits  Balance Balance Balance Assessed: Yes Standardized Balance Assessment Standardized Balance Assessment: Berg Balance Test Berg Balance Test Sit to Stand: Able to stand without using hands and stabilize independently Standing Unsupported: Able to stand safely 2 minutes Sitting with Back Unsupported but Feet Supported on Floor or Stool: Able to sit safely and securely 2 minutes Stand to Sit: Sits safely with minimal use of hands Transfers: Able to transfer safely, minor use of hands Standing Unsupported with Eyes Closed: Able to stand 10 seconds safely Standing Ubsupported with Feet Together: Able to place feet together independently and stand 1 minute safely From Standing, Reach Forward with Outstretched Arm: Can  reach forward >12 cm safely (5") From Standing Position, Pick up Object from Floor: Able to pick up shoe safely and easily From Standing Position, Turn to Look Behind Over each Shoulder: Looks behind from both sides and weight shifts well Turn 360 Degrees: Able to turn 360 degrees safely in 4 seconds or less Standing Unsupported, Alternately Place Feet on Step/Stool: Able to stand independently and safely and complete 8 steps in 20 seconds  Standing Unsupported, One Foot in Front: Able to place foot tandem independently and hold 30 seconds Standing on One Leg: Able to lift leg independently and hold 5-10 seconds Total Score: 54 Static Sitting Balance Static Sitting - Balance Support: No upper extremity supported;Feet supported Static Sitting - Level of Assistance: 7: Independent Dynamic Sitting Balance Dynamic Sitting - Balance Support: No upper extremity supported;Feet supported;During functional activity Dynamic Sitting - Level of Assistance: 6: Modified independent (Device/Increase time) Static Standing Balance Static Standing - Balance Support: No upper extremity supported;During functional activity Static Standing - Level of Assistance: 7: Independent Dynamic Standing Balance Dynamic Standing - Balance Support: No upper extremity supported;During functional activity Dynamic Standing - Level of Assistance: 6: Modified independent (Device/Increase time) Extremity Assessment   RLE Assessment RLE Assessment: Within Functional Limits Passive Range of Motion (PROM) Comments: WFL General Strength Comments: 4+/5 LLE Assessment LLE Assessment: Within Functional Limits Passive Range of Motion (PROM) Comments: [facility] General Strength Comments: 5/5     Excell Seltzer, PT, DPT 05/20/2018, 11:14 AM

## 2018-05-20 NOTE — Progress Notes (Signed)
RLQ drain site (where blake drain was removed earlier today) has drained yellow clear fluid since drain line was removed. Dressing was changed at 1230, 1315, and at 1430.  Each time 4x4 gauzes were soaked and pt had used a towel to catch leaking drainage from edge of dressing before dressing was changed again.   With last dressing change at 1430, the opening was visibly smaller than when drain was removed by NP/PA. Pt instructed the opening should continue to close and the drainage would continue to decrease and stop when the opening closed completely, pt reassured.

## 2018-05-21 LAB — CBC
HCT: 26.2 % — ABNORMAL LOW (ref 36.0–46.0)
Hemoglobin: 8.7 g/dL — ABNORMAL LOW (ref 12.0–15.0)
MCH: 25.7 pg — ABNORMAL LOW (ref 26.0–34.0)
MCHC: 33.2 g/dL (ref 30.0–36.0)
MCV: 77.5 fL — ABNORMAL LOW (ref 80.0–100.0)
Platelets: 102 10*3/uL — ABNORMAL LOW (ref 150–400)
RBC: 3.38 MIL/uL — ABNORMAL LOW (ref 3.87–5.11)
RDW: 20.5 % — ABNORMAL HIGH (ref 11.5–15.5)
WBC: 7 10*3/uL (ref 4.0–10.5)
nRBC: 0 % (ref 0.0–0.2)

## 2018-05-21 LAB — GLUCOSE, CAPILLARY: Glucose-Capillary: 79 mg/dL (ref 70–99)

## 2018-05-21 MED ORDER — FREE WATER: 100.0000 mL | Freq: Two times a day (BID) | Status: DC

## 2018-05-21 MED ORDER — BETHANECHOL CHLORIDE 10 MG PO TABS: 10.0000 mg | ORAL_TABLET | Freq: Two times a day (BID) | ORAL | 1 refills | Status: DC

## 2018-05-21 MED ORDER — HEPARIN SOD (PORK) LOCK FLUSH 100 UNIT/ML IV SOLN
500.0000 [IU] | INTRAVENOUS | Status: AC | PRN
  Administered 2018-05-21: 500 [IU]

## 2018-05-21 MED ORDER — MORPHINE SULFATE 15 MG PO TABS: 15.0000 mg | ORAL_TABLET | ORAL | 0 refills | Status: DC | PRN

## 2018-05-21 MED ORDER — ACETAMINOPHEN 325 MG PO TABS: 325.0000 mg | ORAL_TABLET | ORAL | Status: DC | PRN

## 2018-05-21 MED ORDER — PANCRELIPASE (LIP-PROT-AMYL) 36000-114000 UNITS PO CPEP: 36000.0000 [IU] | ORAL_CAPSULE | Freq: Three times a day (TID) | ORAL | 1 refills | Status: DC

## 2018-05-21 MED ORDER — BETHANECHOL CHLORIDE 10 MG PO TABS: 10.0000 mg | ORAL_TABLET | Freq: Two times a day (BID) | ORAL | Status: DC

## 2018-05-21 MED ORDER — PANTOPRAZOLE SODIUM 40 MG PO TBEC: 40.0000 mg | DELAYED_RELEASE_TABLET | Freq: Every day | ORAL | 1 refills | Status: DC

## 2018-05-21 MED ORDER — POTASSIUM CHLORIDE CRYS ER 20 MEQ PO TBCR: 20.0000 meq | EXTENDED_RELEASE_TABLET | Freq: Every day | ORAL | 1 refills | Status: DC

## 2018-05-21 MED ORDER — ONDANSETRON HCL 8 MG PO TABS: 8.0000 mg | ORAL_TABLET | Freq: Three times a day (TID) | ORAL | 0 refills | Status: DC | PRN

## 2018-05-21 NOTE — Progress Notes (Signed)
[facility] PHYSICAL MEDICINE & REHABILITATION PROGRESS NOTE   Subjective/Complaints: No new issues. RLQ still with drainage but decreasing. No pain. No dizziness. Slept well  ROS: Patient denies fever, rash, sore throat, blurred vision, nausea, vomiting, diarrhea, cough, shortness of breath or chest pain, joint or back pain, headache, or mood change.    Objective:   No results found. Recent Labs    05/20/18 1301 05/21/18 0342  WBC 7.6 7.0  HGB 8.7* 8.7*  HCT 26.4* 26.2*  PLT 103* 102*   Recent Labs    05/20/18 0445  NA 138  K 3.7  CL 102  CO2 29  GLUCOSE 95  BUN 5*  CREATININE 0.66  CALCIUM 8.1*    Intake/Output Summary (Last 24 hours) at 05/21/2018 0911 Last data filed at 05/21/2018 0810 Gross per 24 hour  Intake 360 ml  Output 0 ml  Net 360 ml     Physical Exam: Vital Signs Blood pressure 123/72, pulse 87, temperature 98.9 F (37.2 C), temperature source Oral, resp. rate 18, height 4' 11"  (1.499 m), weight 55.4 kg, SpO2 96 %. Constitutional: No distress . Vital signs reviewed. HEENT: EOMI, oral membranes moist Neck: supple Cardiovascular: RRR without murmur. No JVD    Respiratory: CTA Bilaterally without wheezes or rales. Normal effort    GI: BS +, non-tender, non-distended. Serous drainage RLQ. J tube intact Musc: Lower extremity edema, improved to resolved Neurologic: Alert and oriented Motor: Grossly 4+/5 throughout, stable Skin:: +RLQ drain  Psych: pleasant and cooperative  Assessment/Plan: 1. Functional deficits secondary to debility which require 3+ hours per day of interdisciplinary therapy in a comprehensive inpatient rehab setting.  Physiatrist is providing close team supervision and 24 hour management of active medical problems listed below.  Physiatrist and rehab team continue to assess barriers to discharge/monitor patient progress toward functional and medical goals  Care Tool:  Bathing    Body parts bathed by patient: Right arm, Left  upper leg, Left arm, Right lower leg, Chest, Abdomen, Left lower leg, Face, Front perineal area, Buttocks, Right upper leg         Bathing assist Assist Level: Independent     Upper Body Dressing/Undressing Upper body dressing   What is the patient wearing?: Pull over shirt    Upper body assist Assist Level: Independent    Lower Body Dressing/Undressing Lower body dressing      What is the patient wearing?: Underwear/pull up, Pants     Lower body assist Assist for lower body dressing: Independent     Toileting Toileting    Toileting assist Assist for toileting: Independent with assistive device Assistive Device Comment: grab bar   Transfers Chair/bed transfer  Transfers assist     Chair/bed transfer assist level: Independent     Locomotion Ambulation   Ambulation assist      Assist level: Independent with assistive device Assistive device: No Device(increased time) Max distance: 200'   Walk 10 feet activity   Assist     Assist level: Independent with assistive device Assistive device: No Device   Walk 50 feet activity   Assist    Assist level: Independent with assistive device Assistive device: No Device    Walk 150 feet activity   Assist    Assist level: Independent with assistive device Assistive device: No Device    Walk 10 feet on uneven surface  activity   Assist     Assist level: Independent with assistive device     Wheelchair  Assist Will patient use wheelchair at discharge?: No             Wheelchair 50 feet with 2 turns activity    Assist            Wheelchair 150 feet activity     Assist          Medical Problem List and Plan: 1.  Debility secondary to pancreatic cancer status post Whipple procedure  Continue CIR---dc tomorrow pending labs, surgical plan  -JP right abdomen with clear/serous output---remove today?? 2.  Antithrombotics: -DVT/anticoagulation:  Pharmaceutical:  Lovenox             -antiplatelet therapy: N/A 3. Pain Management: Now on MSIR every 4 hours for pain.  4. Mood: LCSW to follow for evaluation and support.              -antipsychotic agents: N/A 5. Neuropsych: This patient is capable of making decisions on his own behalf. 6. Skin/Wound Care: Routine pressure relief measures 7. Fluids/Electrolytes/Nutrition: encourage PO 8.  PNA/Hepatic necrosis:   -She completed antibiotic regimen 4/30 9.  ABLA:      8.7 today---stable 10. Stress induced hyperglycemia: At high risk to develop diabetes  -Hgb A1C 6.2.   Slightly elevated on 5/3 11. AKI: Resolved 12. Tachycardia: Monitor HR tid--likely due to deconditioning and anemia.  -observe for exercise tolerance  -encourage PO fluids   Controlled on 5/5 13.  Malnutrition: Prealbumin-13.6 . She is attempting small meals. Continue supplements 3-4 times a day.   -reglan has helped with nausea, tolerance of PO---can continue at home 14. Hepatic encephalopathy: Mentation has cleared and ammonia level WNL  15. Urinary retention:   Improved  -continue urecholine---taper off as outpatient  -oob to toilet to void, double voids 16.  Thrombocytopenia  Platelets 124 5/3, labs pending  Have been trending down but were low previously earlier in March, less than 100k  -monitor as outpt 17.  Peripheral edema  Lasix started on 5/2---dc today  Improved, nutritionally related most likely    LOS: 5 days A FACE TO FACE EVALUATION WAS PERFORMED  Meredith Staggers 05/21/2018, 9:11 AM

## 2018-05-21 NOTE — Discharge Summary (Signed)
Physician Discharge Summary  Patient ID: Jane Brooks MRN: [id number] DOB/AGE: Apr 12, 1970 48 y.o.  Admit date: 05/16/2018 Discharge date: 05/21/2018  Discharge Diagnoses:  Principal Problem:   Debility Active Problems:   Acute blood loss anemia   Thrombocytopenia (HCC)   Urinary retention   Sinus tachycardia   Peripheral edema   Prediabetes   Hypokalemia   Discharged Condition: stable   Significant Diagnostic Studies: N/A  Labs:  Basic Metabolic Panel: Recent Labs  Lab 05/16/18 0448 05/17/18 0457 05/20/18 0445  NA 134* 138 138  K 3.5 3.5 3.7  CL 102 104 102  CO2 25 27 29   GLUCOSE 89 92 95  BUN <5* <5* 5*  CREATININE 0.75 0.65 0.66  CALCIUM 8.1* 7.9* 8.1*    CBC: Recent Labs  Lab 05/17/18 0457  05/19/18 0508 05/20/18 1301 05/21/18 0342  WBC 5.3   < > 6.7 7.6 7.0  NEUTROABS 2.8  --  4.2 5.4  --   HGB 6.7*   < > 9.2* 8.7* 8.7*  HCT 21.2*   < > 27.7* 26.4* 26.2*  MCV 76.0*   < > 76.9* 77.2* 77.5*  PLT 173   < > 124* 103* 102*   < > = values in this interval not displayed.    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 She continued to have issues with abdominal pain and underwent Whipple procedure with portal vein reconstruction, placement of pancreatic stent and jejunostomy tube on 04/25/18 by Dr. Barry Dienes. Post op course complicated by SIRS with HCAP, hepatic necrosis with hepatic encephalopathy,urinary retention and thrombocytopenia. She tolerated extubation and was limited by abdominal pain therefore morphine scheduled every 4 hours. She completed her antibiotic course by 4/30 and foley d/c prior to discharge. She was noted to be debilitated and CIR recommended due to functional deficits.     Hospital Course: Jane Brooks was admitted to rehab 05/16/2018 for inpatient therapies to consist of PT and OT at least three hours five days a week. Past admission physiatrist, therapy team and rehab RN have worked together to provide customized collaborative inpatient rehab. Po intake is improving and nausea has greatly improved.  Hgb A1C checked at admission and showed evidence of pre-diabetes at 6.2. BS have been monitored on ac/hs basis and have been reasonable. She  has been educated on carb modified diet/high protein diet.  ABLA noted at admission with H/H dropping to 6.2 therefore she was transfused with 2 units PRBC with improvement in H/H 8.7/26.2.   She does continue to have progressive thrombocytopenia and will need follow up CBC in a week to monitor for stability.   Bladder scans were ordered to monitor voiding and she was noted to have retention. She was started on urecholine and is voiding without difficulty. This was tapered to bid at discharge and to further taper to off in next few weeks. Peripheral edema has resolved with lasix X 2 doses. Follow up check of lytes showed hypokalemia which has resolved with supplementation. Her blood pressures have been stable and she has been afebrile during her stay.  Mentation has improved and is back to baseline.  LLQ drain was removed on 5/4 by Dr. Barry Dienes. She continued to have serous drainage from prior drain site  and area has been covered with compressive dressing. Patient to contact MD if drainage continues or increases after discharge.She had made good gains during her rehab stay and is modified independent at discharge. She will continue to receive follow up    Rehab course: During patient's stay in rehab brief team conference were held to monitor patient's progress, set goals and discuss barriers to discharge. At admission, patient required supervision with mobility and basic self care tasks. Cognitive evaluation revealed functional cognitive abilities therefore not  needed during her stay. She  has had improvement in activity tolerance, balance, postural control as well as ability to compensate for deficits.  She is able to complete ADL tasks at modified independent level. She is modified independent for transfers and to ambulate     Disposition: Home  Diet: Limit carbs/sweets.   Special Instructions: 1. Keep dry compressive dressing on prior drain site. Change 1-2 times a day till resolved. Contact Dr. Barry Dienes if it continues for more than 24 hours. 2. No lifting items over 5 lbs, strenuous activity or driving till cleared by surgery.  3. Will need follow up  CBC to monitor platelets and BMET to monitor lytes/potassium levels.  4. HHRN to draw CBC on 5/11 with results to Dr. Barry Dienes and Dr. Burr Medico.   Discharge Instructions    Ambulatory referral to Physical Medicine Rehab   Complete by:  As directed    1-2 weeks transitional care     Allergies as of 05/21/2018      Reactions   Latex Hives      Medication List    STOP taking these medications   albuterol (2.5 MG/3ML) 0.083% nebulizer solution Commonly known as:  PROVENTIL   cyclobenzaprine 5 MG tablet Commonly known as:  FLEXERIL   lidocaine-prilocaine cream Commonly known as:  EMLA   ondansetron 4 MG disintegrating tablet Commonly known as:  ZOFRAN-ODT   prochlorperazine 10 MG tablet Commonly known as:  COMPAZINE     TAKE these medications   acetaminophen 325 MG tablet Commonly known as:  TYLENOL Take 1-2 tablets (325-650 mg total) by mouth every 4 (four) hours as needed for mild pain.   bethanechol 10 MG tablet Commonly known as:  URECHOLINE Take 1 tablet (10 mg total) by mouth 2 (two) times a day.   free water Soln Place 100 mLs into feeding tube 2 (two) times daily. Use filtered water What changed:    how much to take  when to take this  additional instructions   lipase/protease/amylase 36000 UNITS Cpep capsule Commonly known as:  CREON Take 1 capsule (36,000 Units  total) by mouth 3 (three) times daily.   methocarbamol 500 MG tablet Commonly known as:  ROBAXIN Take 1 tablet (500 mg total) by mouth every 6 (six) hours as needed for muscle spasms.   morphine 15 MG tablet Commonly known as:  MSIR Take 1 tablet (15 mg total) by mouth every 4 (four) hours as needed for severe pain. What changed:    when to take this  reasons to take this   ondansetron 8 MG tablet Commonly known as:  ZOFRAN Take 1 tablet (8 mg total) by mouth every 8 (eight) hours as needed for nausea or vomiting.   pantoprazole 40 MG tablet Commonly known as:  PROTONIX Take 1 tablet (40 mg total) by mouth daily.   potassium chloride SA 20 MEQ tablet Commonly known as:  K-DUR Take 1 tablet (20 mEq total) by mouth daily. What changed:  when to  take this   promethazine 25 MG tablet Commonly known as:  PHENERGAN Take 1 tablet (25 mg total) by mouth every 6 (six) hours as needed for nausea or vomiting.      Follow-up Information    Meredith Staggers, MD Follow up.   Specialty:  Physical Medicine and Rehabilitation Why:  Office will call you with follow up appointment Contact information: [address] [place] [address] [phone]        Stark Klein, MD. Call.   Specialty:  General Surgery Why:  for follow up appointment Contact information: [address] [place] [phone]           Signed: Bary Leriche 05/22/2018, 9:16 AM

## 2018-05-21 NOTE — Discharge Instructions (Signed)
Inpatient Rehab Discharge Instructions  Jane Brooks Discharge date and time: 05/21/18   Activities/Precautions/ Functional Status: Activity: no lifting, driving, or strenuous exercise till cleared by MD Diet: diabetic diet Wound Care: Keep dry compressive dressing on prior drain site to help reduce drainage.  Change 2-3 times a day to keep wound clean and dry. If drainage does not resolve in 24 hours contact surgeon for input. Contact Dr. Barry Dienes if you develop any problems with your incision/wound--redness, swelling, increase in pain, drainage or if you develop fever or chills.   Functional status:  ___ No restrictions     ___ Walk up steps independently ___ 24/7 supervision/assistance   ___ Walk up steps with assistance _X__ Intermittent supervision/assistance  ___ Bathe/dress independently ___ Walk with walker     ___ Bathe/dress with assistance ___ Walk Independently                ___ Shower independently ___ Walk with assistance    ___ Shower with assistance _X__ No alcohol     ___ Return to work/school ________   Special Instructions: 1. Monitor sweets and carbs due to pre-diabetes.  2. Flush PEG with 100 cc of filtered water. NO tap or distilled water.  3. Toilet yourself every 4 hours and after meals. Double void so that you can empty bladder completely.    COMMUNITY REFERRALS UPON DISCHARGE:    Home Health:   PT     OT    RN                             Agency:  [facility] Phone: [phone]   Medical Equipment/Items Ordered: tub bench                                                              Agency/Supplier:  [facility] @ [phone]   My questions have been answered and I understand these instructions. I will adhere to these goals and the provided educational materials after my discharge from the hospital.  Patient/Caregiver Signature _______________________________ Date __________  Clinician Signature _______________________________________  Date __________  Please bring this form and your medication list with you to all your follow-up doctor's appointments.         High-Calorie, High-Protein Nutrition Therapy  A high-calorie, high-protein diet has been recommended for you either because you cant eat enough calories throughout the day, have lost weight, or need to add protein to your diet. Following the recommendations on this handout can help you: - Gain weight and give your body energy - Get more protein from foods that help your body heal and grow strong - Recover from surgery or illness  Tips to Eat More Calories and Protein  Aim for at Least 6 Meals and Snacks Each Day  Extra meals and snacks can help you get enough calories and protein.  You may want to try high-calorie supplement drinks (made at home or bought at a store) periodically between meals to get more calories each day. ? If you buy the drink at the store, read the label to look for products with 200-400 calories per serving. ? If you make the drink at home, you can increase calories by adding protein ingredients such as nonfat  milk, low-fat yogurt, nonfat milk powder, or protein powder.  Enjoy snacks such as milkshakes, smoothies, pudding, ice cream, or custard.  Eat More Fat  Fat provides a lot of calories in just a few bites. A tablespoon of oil, butter, or margarine has about 100 calories.  Add butter, margarine, or oil to bread, potatoes, vegetables, and soups.  Use mayonnaise, salad dressing, and peanut butter freely.  Choose High-Protein Foods  Enjoy milk, eggs, cheese, meat, fish, poultry, and beans. Consider trying protein powders and meal replacement shakes and bars.  Choose higher-fat meats. They have more calories than lean meats. ? Examples include chicken thighs, marbled meats, bacon, sausage, poultry with skin  Choose whole milk instead of low-fat or skim milk.  Eat high-fat cheeses instead of low-fat or nonfat  cheeses.  Shopping Tips  Avoid diet, low-calorie, or low-fat food items.  Look for dairy products (milk, cheese, yogurt, cottage cheese) that are labeled whole fat or have at least 4% fat.  Purchase nonfat dry milk powder or protein powder to use to make shakes or other blended recipes.    Cooking Tips  Make a high-protein milk recipe like the one below. The recipe can be prepared in advance and stored in the refrigerator until you are ready to drink it. Use this high-protein milk in recipes that call for milk or drink it as a beverage.  ? 1 cup whole milk ?  cup nonfat dry milk powder  Add cheese sauce, butter, and sour cream to vegetable and potato dishes.  Get extra calories by adding condensed milk, cream, butter, nut butters, and sweetener to hot cereals, mashed potato, pudding, and soups. Examples: ? Prepare oatmeal with condensed milk, butter/nut butter, and brown sugar ? Prepare mashed potatoes with cream, butter, and cheese ? Prepare soup with cream and extra butter, or puree the soup with cream to make a bisque ? Add cream to pudding mix or use pudding dry mix in cakes/baked goods  Serve items with extra sauces. These contain additional calories: ? Gravy on meats and potatoes ? Extra mayonnaise, BBQ sauce or ketchup  Dipping sauces, hummus, and regular (not low-fat/low-calorie) salad dressing     Foods Recommended Calories Protein in grams (g)      Protein Foods    1 cup cooked dried beans 240 14   cup chicken salad 200 14  1 egg cooked with 1 tablespoon butter 175 6  3 ounces tuna canned in oil 170 25   cup egg substitute 25 5      1  ounce pecans (20 halves) 200 3  1 ounce macadamia nuts (10-12 nuts) 200 2  1 ounce [place] nuts (6-8 nuts) 190 4  1 ounce walnuts (14 halves) 185 4  1 ounce shelled sunflower seeds 175 6  1 ounce almonds (about 24) 165 4  1 ounce peanuts 165 7  1 tablespoon peanut butter 95 4       cup canned evaporated milk  (can be used instead of water when cooking) 170 9  6 ounces sweetened yogurt 165 6   cup ice cream 130 2-3   cup (1 ounce) shredded cheese 115 7   cup creamed cottage cheese 110 13   cup half-and-half 80 2   cup whole milk (can be used instead of water when cooking) 75 4  1 tablespoon cream cheese 50 1  2 tablespoons sour cream 50 0      Fats    1 tablespoon butter, margarine, oil,  or mayonnaise 100 0  2 tablespoons gravy 4 1      Sweets    1 tablespoon honey 60 0  1 tablespoon sugar, jam, jelly, or chocolate syrup 50 0      Meal Replacements    1 meal replacement bar 200 15  1 scoop (1 ounce) protein powder 100 15  1 tablespoon protein powder 40 5

## 2018-05-21 NOTE — Progress Notes (Signed)
Patient given discharge instructions by Jeannene Patella, PA. All of patients questions answered. Patient wheeled down via wheelchair with all personal belongings by nurse and nurse tech Nicholes Rough, LPN

## 2018-05-21 NOTE — Patient Care Conference (Signed)
Inpatient RehabilitationTeam Conference and Plan of Care Update Date: 05/21/2018   Time: 11:00 AM    Patient Name: Jane Brooks      Medical Record Number: [id number]  Date of Birth: 10/27/1970 Sex: Female         Room/Bed: 4W18C/4W18C-01 Payor Info: Payor: MEDICAID [place] / Plan: MEDICAID [place] ACCESS / Product Type: *No Product type* /    Admitting Diagnosis: debility  Admit Date/Time:  05/16/2018  3:51 PM Admission Comments: No comment available   Primary Diagnosis:  Debility Principal Problem: Debility  Patient Active Problem List   Diagnosis Date Noted  . Hypokalemia 05/22/2018  . Peripheral edema   . Prediabetes   . Acute blood loss anemia   . Thrombocytopenia ([place])   . Urinary retention   . Sinus tachycardia   . Debility 05/16/2018  . Acute respiratory failure with hypoxemia ([place]) 04/30/2018  . HCAP (healthcare-associated pneumonia) 04/30/2018  . Essential hypertension 04/30/2018  . Chronic prescription opiate use 04/30/2018  . Postoperative pain 04/30/2018  . Tachycardia 04/30/2018  . Tachypnea 04/30/2018  . Acute respiratory alkalosis 04/30/2018  . Adenocarcinoma of head of pancreas ([place]) 04/25/2018  . Genetic testing 01/24/2018  . Family history of uterine cancer   . Family history of prostate cancer   . Family history of lung cancer   . Pancreatic cancer ([place]) 11/09/2017  . Pancreatic mass   . Cholecystitis 10/30/2017  . Jaundice 10/30/2017    Expected Discharge Date: Expected Discharge Date: 05/21/18  Team Members Present: Physician leading conference: Dr. 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 transfused over weekend. blood ct stable today  finalize dc medical planning   wound care, anemia, nutrition, nausea   Bowel/Bladder   patient cont of bowel and bladder  remain cont  monitor patients bowel and bladder   Swallow/Nutrition/ Hydration             ADL's   Supervision-mod I overall  Mod I overall  ADL/IADL re-training, d/c planning, energy conservation   Mobility   mod I/independent with transfers, gait with no AD, stairs  mod I overall  d/c planning   Communication             Safety/Cognition/ Behavioral Observations            Pain   chronic pain managed with scheduled morphine  <2/10   assess for pain q shift and PRN   Skin   puncture site Right lower abdomen - dry dressing BID with tape   no new breakdown or infection  assess wound q shift and  change dressing as ordered     Rehab Goals Patient on target to meet rehab goals: Yes *See Care Plan and progress notes for long and short-term goals.     Barriers to Discharge  Current Status/Progress Possible Resolutions Date Resolved   Physician                    Nursing                  PT                    OT  SLP                SW                Discharge Planning/Teaching Needs:  plan to d/c home with intermittent support of local friends  NA - mod ind goals   Team Discussion:  Pt has met mod ind goals overall and ready for d/c today.  Revisions to Treatment Plan:  NA    Continued Need for Acute Rehabilitation Level of Care: The patient requires daily medical management by a physician with specialized training in physical medicine and rehabilitation for the following conditions: Daily direction of a multidisciplinary physical rehabilitation program to ensure safe treatment while eliciting the highest outcome that is of practical value to the patient.: Yes Daily medical management of patient stability for increased activity during participation in an intensive rehabilitation regime.: Yes Daily analysis of laboratory values and/or radiology reports with any  subsequent need for medication adjustment of medical intervention for : Post surgical problems;Wound care problems   I attest that I was present, lead the team conference, and concur with the assessment and plan of the team.   Sierra Spargo 05/22/2018, 9:26 AM   Team conference was held via web/ teleconference due to [place] - 19

## 2018-05-22 DIAGNOSIS — E876 Hypokalemia: Secondary | ICD-10-CM

## 2018-05-22 NOTE — Progress Notes (Signed)
Social Work  Discharge Note  The overall goal for the admission was met for:   Discharge location: Yes - pt returning to her home with intermittent support of friends.  Length of Stay: Yes - 5 days  Discharge activity level: Yes - independent  Home/community participation: Yes  Services provided included: MD, RD, PT, OT, SLP, RN, Pharmacy and SW  Financial Services: Medicaid  Follow-up services arranged: Home Health: RN, PT, OT via [facility], DME: tub bench via [facility] and Patient/Family has no preference for HH/DME agencies  Comments (or additional information):  Patient/Family verbalized understanding of follow-up arrangements: Yes  Individual responsible for coordination of the follow-up plan: pt  Confirmed correct DME delivered: Allycia Pitz 05/22/2018    Lylith Bebeau

## 2018-05-23 ENCOUNTER — Telehealth: Payer: Self-pay | Admitting: *Deleted

## 2018-05-23 NOTE — Telephone Encounter (Signed)
Marene Lenz, RN, [facility] left a message asking for verbal orders for [facility] 1week4 to address feeding tube site, wound care, and medical education.  Medical record reviewed. Social work note reviewed.  Verbal orders given per office protocol.

## 2018-05-23 NOTE — Telephone Encounter (Signed)
Patient left a message concerning discharge instructions.  I called her back. She is asking about medications she was told to stop. #1 odansetron-she was told to stop and then she was prescribed phenergen #2 flexeril-can she resume or does she stop  #3 albuterol nebulizer solution - is she allowed to restart or should she stay off. And apparently she needs a nebulizer device which she was not given.  Is there away to prescribe a nebulizer device? That is contingent on whether or not she is allowed to continue.

## 2018-05-24 MED ORDER — CYCLOBENZAPRINE HCL 5 MG PO TABS: 5.0000 mg | ORAL_TABLET | Freq: Three times a day (TID) | ORAL | 1 refills | Status: DC | PRN

## 2018-05-24 NOTE — Telephone Encounter (Signed)
I gave her the information.  She has never used albuterol and it was written at discharge by the surgeon. Since it was not continued and she had never used it or had need for it, I told her not to continue since that was the directions from [facility] at our discharge.

## 2018-05-24 NOTE — Telephone Encounter (Signed)
1. She may use phenergan in place of odansetron 2. She may use flexeril if needed for spasms. I wrote her an rx for this.   3.   Does she have albuterol at home? If so, why wouldn't she have a nebulizer already?  If she doesn't have the neb med or the machine than I would think she would be fine just using an albuterol inhaler.

## 2018-05-28 ENCOUNTER — Telehealth: Payer: Self-pay | Admitting: Registered Nurse

## 2018-05-28 NOTE — Telephone Encounter (Signed)
Transitional Care call  Patient name: Jane Brooks DOB: 03/24/1970 1. Are you/is patient experiencing any problems since coming home? No a. Are there any questions regarding any aspect of care? No 2. Are there any questions regarding medications administration/dosing? No a. Are meds being taken as prescribed? Yes b. "Patient should review meds with caller to confirm" Medication List Reviewed 3. Have there been any falls? No 4. Has Home Health been to the house and/or have they contacted you? Yes, [facility]  a. If not, have you tried to contact them? NA b. Can we help you contact them? NA 5. Are bowels and bladder emptying properly? Yes a. Are there any unexpected incontinence issues? No b. If applicable, is patient following bowel/bladder programs? NA 6. Any fevers, problems with breathing, unexpected pain? No 7. Are there any skin problems or new areas of breakdown? No 8. Has the patient/family member arranged specialty MD follow up (ie cardiology/neurology/renal/surgical/etc.)?  HFU appointments are scheduled  a. Can we help arrange? NA 9. Does the patient need any other services or support that we can help arrange? No 10. Are caregivers following through as expected in assisting the patient? [name] states she has no family in the area, her friends are helping her out.  11. Has the patient quit smoking, drinking alcohol, or using drugs as recommended? (                         Appointment date/time 06/04/2018  arrival time 11:00 for 11:20 appointment with Bayard Hugger ANP-C. At [facility]

## 2018-05-29 ENCOUNTER — Telehealth: Payer: Self-pay

## 2018-05-29 NOTE — Telephone Encounter (Signed)
[facility] called requesting verbal orders for OT for 2xwk X 2wks followed by 1xwk X 2wks.  Called him back and approved verbal orders.

## 2018-06-03 ENCOUNTER — Telehealth: Payer: Self-pay

## 2018-06-03 NOTE — Telephone Encounter (Signed)
Ronny Bacon, RN from [facility] called stating CBC drawn on patient on Friday. Hemogloblin 6.9. Ronny Bacon asking if she needs to discuss any instructions for patient or if there are any other orders?

## 2018-06-03 NOTE — Telephone Encounter (Signed)
I recommend that she contact her surgeon, Dr. Bradd Burner and seek her opinion. Last known hgb was 8.7 on 5/5.  thx

## 2018-06-03 NOTE — Telephone Encounter (Signed)
Jane Brooks has been notified.

## 2018-06-04 ENCOUNTER — Encounter: Payer: Medicaid Other | Attending: Registered Nurse | Admitting: Registered Nurse

## 2018-06-04 ENCOUNTER — Encounter: Payer: Self-pay | Admitting: Registered Nurse

## 2018-06-04 ENCOUNTER — Other Ambulatory Visit: Payer: Self-pay

## 2018-06-04 VITALS — Ht 59.0 in | Wt 118.0 lb

## 2018-06-04 DIAGNOSIS — R339 Retention of urine, unspecified: Secondary | ICD-10-CM

## 2018-06-04 DIAGNOSIS — D696 Thrombocytopenia, unspecified: Secondary | ICD-10-CM

## 2018-06-04 DIAGNOSIS — R5381 Other malaise: Secondary | ICD-10-CM | POA: Diagnosis not present

## 2018-06-04 DIAGNOSIS — E876 Hypokalemia: Secondary | ICD-10-CM

## 2018-06-04 NOTE — Progress Notes (Signed)
Subjective:    Patient ID: Jane Brooks, female    DOB: 07-Jan-1971, 48 y.o.   MRN: [id number]  HPI: Jane Brooks is a 48 y.o. female her appointment was changed, due to national recommendations of social distancing due to [place] 19, an audio/video telehealth visit is felt to be most appropriate for this patient at this time.  See Chart message from today for the patient's consent to telehealth from [place].    Jane Brooks was called regarding her hospital follow up for debility, thrombocytopenia, urinary retention and hypokalemia. Has a history of pancreatic head adenocarcinoma. She continued with abdominal pain. She was admitted to [facility] on 04/25/2018, she underwent whipple procedure, reconstruction of portal vein on 04/25/2018 by Dr. Barry Dienes.  Dr. Barry Dienes and Dr. Burr Medico following her thrombocytopenia.   During her post op course she developed urinary retention, she was prescribed Urecholine.  She was admitted to Inpatient rehabilitation on 05/16/2018 and discharged on 05/21/2018. She was discharged home with [facility]. She denies pain at this time. Also reports she has a good appetite.   Jane Brooks Morphine equivalent is 90.00 MME, Dr. Barry Dienes prescribing her morphine.    Geryl Rankins CMA asked the Health and History Questions. This provider and Mancel Parsons verified we were speaking with the correct person using two identifiers.  Pain Inventory Average Pain 7 Pain Right Now 3 My pain is intermittent and tearing, throbbing  In the last 24 hours, has pain interfered with the following? General activity 5 Relation with others 0 Enjoyment of life 0 What TIME of day is your pain at its worst? morning, night Sleep (in general) Poor  Pain is worse with: walking, bending, sitting, inactivity, standing and some activites Pain improves with: rest and medication Relief from Meds: 8  Mobility walk without assistance  how many minutes can you walk? 15 ability to climb steps?  yes do you drive?  no  Function disabled: date disabled .  Neuro/Psych weakness  Prior Studies hospital f/u  Physicians involved in your care hospital f/u   Family History  Problem Relation Age of Onset  . Diabetes Mother   . Hypertension Mother   . COPD Mother   . Uterine cancer Mother 31       had hysterectomy  . Diabetes Sister   . Hypertension Sister   . Lung cancer Maternal Grandmother        lung cancer  . Hypertension Sister    Social History   Socioeconomic History  . Marital status: Single    Spouse name: Not on file  . Number of children: Not on file  . Years of education: Not on file  . Highest education level: Not on file  Occupational History  . Occupation: unemployed  Social Needs  . Financial resource strain: Not on file  . Food insecurity:    Worry: Not on file    Inability: Not on file  . Transportation needs:    Medical: Not on file    Non-medical: Not on file  Tobacco Use  . Smoking status: Former Smoker    Packs/day: 1.00    Years: 37.00    Pack years: 37.00    Types: Cigarettes    Last attempt to quit: 04/25/2018    Years since quitting: 0.1  . Smokeless tobacco: Never Used  Substance and Sexual Activity  . Alcohol use: Not Currently  . Drug use: Never  . Sexual activity: Not Currently  Lifestyle  . Physical activity:    Days per week: Not on file    Minutes per session: Not on file  . Stress: Not on file  Relationships  . Social connections:    Talks on phone: Not on file    Gets together: Not on file    Attends religious service: Not on file    Active member of club or organization: Not on file    Attends meetings of clubs or organizations: Not on file    Relationship status: Not on file  Other Topics Concern  . Not on file  Social History Narrative  . Not on file   Past Surgical History:  Procedure Laterality Date  . BILIARY STENT PLACEMENT  10/31/2017    Procedure: BILIARY STENT PLACEMENT;  Surgeon: [name], MD;  Location[facility] ENDOSCOPY;  Service: Gastroenterology;;  . ERCP N/A 10/31/2017   Procedure: ENDOSCOPIC RETROGRADE CHOLANGIOPANCREATOGRAPHY (ERCP);  Surgeon: [name], MD;  [facility] ENDOSCOPY;  Service: Gastroenterology;  Laterality: N/A;  . ESOPHAGOGASTRODUODENOSCOPY N/A 11/08/2017   Procedure: ESOPHAGOGASTRODUODENOSCOPY (EGD);  Surgeon: Milus Banister, MD;  Location: [facility] ENDOSCOPY;  Service: Endoscopy;  Laterality: N/A;  . EUS N/A 11/08/2017   Procedure: UPPER ENDOSCOPIC ULTRASOUND (EUS) RADIAL;  Surgeon: Milus Banister, MD;  Location: [facility] ENDOSCOPY;  Service: Endoscopy;  Laterality: N/A;  . FINE NEEDLE ASPIRATION  11/08/2017   Procedure: FINE NEEDLE ASPIRATION;  Surgeon: Milus Banister, MD;  Location: [facility] ENDOSCOPY;  Service: Endoscopy;;  . IR IMAGING GUIDED PORT INSERTION  11/15/2017  . LAPAROSCOPY N/A 04/25/2018   Procedure: LAPAROSCOPY DIAGNOSTIC;  Surgeon: Stark Klein, MD;  Location: [facility];  Service: General;  Laterality: N/A;  GENERAL AND EPIDURAL ANESTHESIA  . SPHINCTEROTOMY  10/31/2017   Procedure: SPHINCTEROTOMY;  Surgeon: [name], MD;  Location: [facility] ENDOSCOPY;  Service: Gastroenterology;;  . TONSILLECTOMY    . WHIPPLE PROCEDURE N/A 04/25/2018   Procedure: WHIPPLE PROCEDURE, RECONSTRUCTION OF PORTAL VEIN;  Surgeon: Stark Klein, MD;  Location: [facility];  Service: General;  Laterality: N/A;   Past Medical History:  Diagnosis Date  . Family history of lung cancer   . Family history of prostate cancer   . Family history of uterine cancer   . Gallstones 10/2017  . Pancreatic cancer ([place])   . Pneumothorax, closed, traumatic    years ago  . PONV (postoperative nausea and vomiting)    Ht 4' 11"  (1.499 m)   Wt 118 lb (53.5 kg)   LMP  (LMP Unknown)   BMI 23.83 kg/m   Opioid Risk Score:   Fall Risk Score:  `1  Depression screen PHQ 2/9  No flowsheet data found.  Review of Systems  Constitutional:  Positive for appetite change and fatigue.  HENT: Negative.   Eyes: Negative.   Respiratory: Negative.   Cardiovascular: Negative.   Gastrointestinal: Positive for abdominal pain and constipation.  Endocrine: Negative.   Genitourinary: Negative.   Musculoskeletal: Negative.   Skin: Negative.   Allergic/Immunologic: Negative.   Neurological: Positive for weakness.  Hematological: Negative.   Psychiatric/Behavioral: Negative.   All other systems reviewed and are negative.      Objective:   Physical Exam Vitals signs and nursing note reviewed.  Musculoskeletal:     Comments: No Physical Exam Performed: Virtual Visit  Neurological:     Mental Status: She is oriented to person, place, and time.           Assessment & Plan:  1. Debility: [place] with  [facility].  2. Thrombocytopenia: Dr. Odie Sera and Dr. Burr Medico following.  3. Urinary Retention: Continue current medication regimen. Continue to Monitor.  4. Hypokalemia: Continue current medication regimen. PCP following.   F/U with Dr. Naaman Plummer in 4-6 weeks    Telephone Call  Location of patient: In Her Home  Location of provider: Office Established patient Time spent on call: 10 Minutes

## 2018-06-05 DIAGNOSIS — R339 Retention of urine, unspecified: Secondary | ICD-10-CM

## 2018-06-05 DIAGNOSIS — C250 Malignant neoplasm of head of pancreas: Secondary | ICD-10-CM | POA: Diagnosis not present

## 2018-06-05 DIAGNOSIS — Z9049 Acquired absence of other specified parts of digestive tract: Secondary | ICD-10-CM

## 2018-06-05 DIAGNOSIS — F17210 Nicotine dependence, cigarettes, uncomplicated: Secondary | ICD-10-CM

## 2018-06-05 DIAGNOSIS — D696 Thrombocytopenia, unspecified: Secondary | ICD-10-CM

## 2018-06-05 DIAGNOSIS — Z483 Aftercare following surgery for neoplasm: Secondary | ICD-10-CM

## 2018-06-05 DIAGNOSIS — G893 Neoplasm related pain (acute) (chronic): Secondary | ICD-10-CM | POA: Diagnosis not present

## 2018-06-05 DIAGNOSIS — E46 Unspecified protein-calorie malnutrition: Secondary | ICD-10-CM

## 2018-06-05 DIAGNOSIS — Z9689 Presence of other specified functional implants: Secondary | ICD-10-CM

## 2018-06-05 DIAGNOSIS — Z934 Other artificial openings of gastrointestinal tract status: Secondary | ICD-10-CM

## 2018-06-05 DIAGNOSIS — R5381 Other malaise: Secondary | ICD-10-CM

## 2018-06-15 ENCOUNTER — Emergency Department (HOSPITAL_COMMUNITY): Payer: Medicaid Other

## 2018-06-15 ENCOUNTER — Other Ambulatory Visit: Payer: Self-pay

## 2018-06-15 ENCOUNTER — Encounter (HOSPITAL_COMMUNITY): Payer: Self-pay

## 2018-06-15 ENCOUNTER — Inpatient Hospital Stay (HOSPITAL_COMMUNITY)
Admission: EM | Admit: 2018-06-15 | Discharge: 2018-06-19 | DRG: 392 | Disposition: A | Discharge status: Home Health | Payer: Medicaid Other | Attending: General Surgery | Admitting: General Surgery

## 2018-06-15 DIAGNOSIS — K529 Noninfective gastroenteritis and colitis, unspecified: Secondary | ICD-10-CM | POA: Diagnosis present

## 2018-06-15 DIAGNOSIS — D649 Anemia, unspecified: Secondary | ICD-10-CM | POA: Diagnosis not present

## 2018-06-15 DIAGNOSIS — E876 Hypokalemia: Secondary | ICD-10-CM | POA: Diagnosis not present

## 2018-06-15 DIAGNOSIS — Z1159 Encounter for screening for other viral diseases: Secondary | ICD-10-CM | POA: Diagnosis not present

## 2018-06-15 DIAGNOSIS — Z90411 Acquired partial absence of pancreas: Secondary | ICD-10-CM

## 2018-06-15 DIAGNOSIS — E440 Moderate protein-calorie malnutrition: Secondary | ICD-10-CM | POA: Diagnosis present

## 2018-06-15 DIAGNOSIS — Z87891 Personal history of nicotine dependence: Secondary | ICD-10-CM | POA: Diagnosis not present

## 2018-06-15 DIAGNOSIS — C779 Secondary and unspecified malignant neoplasm of lymph node, unspecified: Secondary | ICD-10-CM | POA: Diagnosis present

## 2018-06-15 DIAGNOSIS — D638 Anemia in other chronic diseases classified elsewhere: Secondary | ICD-10-CM | POA: Diagnosis present

## 2018-06-15 DIAGNOSIS — R112 Nausea with vomiting, unspecified: Secondary | ICD-10-CM

## 2018-06-15 DIAGNOSIS — R109 Unspecified abdominal pain: Secondary | ICD-10-CM

## 2018-06-15 DIAGNOSIS — C259 Malignant neoplasm of pancreas, unspecified: Secondary | ICD-10-CM | POA: Diagnosis present

## 2018-06-15 DIAGNOSIS — C250 Malignant neoplasm of head of pancreas: Secondary | ICD-10-CM | POA: Diagnosis not present

## 2018-06-15 DIAGNOSIS — R110 Nausea: Secondary | ICD-10-CM

## 2018-06-15 LAB — CBC WITH DIFFERENTIAL/PLATELET
Abs Immature Granulocytes: 0.01 10*3/uL (ref 0.00–0.07)
Basophils Absolute: 0 10*3/uL (ref 0.0–0.1)
Basophils Relative: 0 %
Eosinophils Absolute: 0 10*3/uL (ref 0.0–0.5)
Eosinophils Relative: 0 %
HCT: 32.8 % — ABNORMAL LOW (ref 36.0–46.0)
Hemoglobin: 10.6 g/dL — ABNORMAL LOW (ref 12.0–15.0)
Immature Granulocytes: 0 %
Lymphocytes Relative: 17 %
Lymphs Abs: 0.9 10*3/uL (ref 0.7–4.0)
MCH: 23.9 pg — ABNORMAL LOW (ref 26.0–34.0)
MCHC: 32.3 g/dL (ref 30.0–36.0)
MCV: 73.9 fL — ABNORMAL LOW (ref 80.0–100.0)
Monocytes Absolute: 0.3 10*3/uL (ref 0.1–1.0)
Monocytes Relative: 6 %
Neutro Abs: 4 10*3/uL (ref 1.7–7.7)
Neutrophils Relative %: 77 %
Platelets: 194 10*3/uL (ref 150–400)
RBC: 4.44 MIL/uL (ref 3.87–5.11)
RDW: 22 % — ABNORMAL HIGH (ref 11.5–15.5)
WBC: 5.3 10*3/uL (ref 4.0–10.5)
nRBC: 0 % (ref 0.0–0.2)

## 2018-06-15 LAB — COMPREHENSIVE METABOLIC PANEL
ALT: 19 U/L (ref 0–44)
AST: 34 U/L (ref 15–41)
Albumin: 2.9 g/dL — ABNORMAL LOW (ref 3.5–5.0)
Alkaline Phosphatase: 95 U/L (ref 38–126)
Anion gap: 9 (ref 5–15)
BUN: <5 mg/dL — ABNORMAL LOW (ref 6–20)
CO2: 24 mmol/L (ref 22–32)
Calcium: 8.7 mg/dL — ABNORMAL LOW (ref 8.9–10.3)
Chloride: 104 mmol/L (ref 98–111)
Creatinine, Ser: 0.61 mg/dL (ref 0.44–1.00)
GFR calc Af Amer: >60 mL/min (ref >60)
GFR calc non Af Amer: >60 mL/min (ref >60)
Glucose, Bld: 93 mg/dL (ref 70–99)
Potassium: 3.9 mmol/L (ref 3.5–5.1)
Sodium: 137 mmol/L (ref 135–145)
Total Bilirubin: 0.7 mg/dL (ref 0.3–1.2)
Total Protein: 6.5 g/dL (ref 6.5–8.1)

## 2018-06-15 LAB — URINALYSIS, ROUTINE W REFLEX MICROSCOPIC
Bilirubin Urine: NEGATIVE
Glucose, UA: NEGATIVE mg/dL
Hgb urine dipstick: NEGATIVE
Ketones, ur: NEGATIVE mg/dL
Leukocytes,Ua: NEGATIVE
Nitrite: NEGATIVE
Protein, ur: NEGATIVE mg/dL
Specific Gravity, Urine: 1.017 (ref 1.005–1.030)
pH: 7 (ref 5.0–8.0)

## 2018-06-15 LAB — SARS CORONAVIRUS 2 BY RT PCR (HOSPITAL ORDER, PERFORMED IN ~~LOC~~ HOSPITAL LAB): SARS Coronavirus 2: NEGATIVE

## 2018-06-15 LAB — LIPASE, BLOOD: Lipase: 17 U/L (ref 11–51)

## 2018-06-15 MED ORDER — PROCHLORPERAZINE EDISYLATE 10 MG/2ML IJ SOLN
5.0000 mg | Freq: Four times a day (QID) | INTRAMUSCULAR | Status: DC | PRN
  Administered 2018-06-16 – 2018-06-19 (×9): 10 mg via INTRAVENOUS
  Filled 2018-06-15 (×11): qty 2

## 2018-06-15 MED ORDER — PROMETHAZINE HCL 25 MG/ML IJ SOLN
12.5000 mg | Freq: Once | INTRAMUSCULAR | Status: AC
  Administered 2018-06-15: 12.5 mg via INTRAVENOUS
  Filled 2018-06-15: qty 1

## 2018-06-15 MED ORDER — MORPHINE SULFATE 15 MG PO TABS
15.0000 mg | ORAL_TABLET | ORAL | Status: DC | PRN
  Administered 2018-06-17 – 2018-06-19 (×7): 15 mg via ORAL
  Filled 2018-06-15 (×7): qty 1

## 2018-06-15 MED ORDER — PANTOPRAZOLE SODIUM 40 MG PO TBEC
40.0000 mg | DELAYED_RELEASE_TABLET | Freq: Every day | ORAL | Status: DC
  Administered 2018-06-16 – 2018-06-19 (×4): 40 mg via ORAL
  Filled 2018-06-15 (×5): qty 1

## 2018-06-15 MED ORDER — HYDROMORPHONE HCL 1 MG/ML IJ SOLN
0.5000 mg | INTRAMUSCULAR | Status: DC | PRN
  Administered 2018-06-15 – 2018-06-16 (×10): 1 mg via INTRAVENOUS
  Administered 2018-06-17: 2 mg via INTRAVENOUS
  Administered 2018-06-17 – 2018-06-19 (×21): 1 mg via INTRAVENOUS
  Filled 2018-06-15 (×11): qty 1
  Filled 2018-06-15: qty 2
  Filled 2018-06-15 (×20): qty 1

## 2018-06-15 MED ORDER — DIPHENHYDRAMINE HCL 50 MG/ML IJ SOLN: 12.5000 mg | Freq: Four times a day (QID) | INTRAMUSCULAR | Status: DC | PRN

## 2018-06-15 MED ORDER — ONDANSETRON 4 MG PO TBDP: 4.0000 mg | ORAL_TABLET | Freq: Four times a day (QID) | ORAL | Status: DC | PRN

## 2018-06-15 MED ORDER — SODIUM CHLORIDE 0.9 % IV SOLN
2.0000 g | Freq: Three times a day (TID) | INTRAVENOUS | Status: DC
  Administered 2018-06-15 – 2018-06-19 (×11): 2 g via INTRAVENOUS
  Filled 2018-06-15 (×13): qty 2

## 2018-06-15 MED ORDER — SIMETHICONE 80 MG PO CHEW: 40.0000 mg | CHEWABLE_TABLET | Freq: Four times a day (QID) | ORAL | Status: DC | PRN

## 2018-06-15 MED ORDER — ENOXAPARIN SODIUM 40 MG/0.4ML ~~LOC~~ SOLN
40.0000 mg | SUBCUTANEOUS | Status: DC
  Administered 2018-06-15 – 2018-06-18 (×4): 40 mg via SUBCUTANEOUS
  Filled 2018-06-15 (×4): qty 0.4

## 2018-06-15 MED ORDER — MORPHINE SULFATE (PF) 4 MG/ML IV SOLN
4.0000 mg | Freq: Once | INTRAVENOUS | Status: AC
  Administered 2018-06-15: 15:00:00 4 mg via INTRAVENOUS
  Filled 2018-06-15: qty 1

## 2018-06-15 MED ORDER — MORPHINE SULFATE (PF) 4 MG/ML IV SOLN
8.0000 mg | Freq: Once | INTRAVENOUS | Status: AC
  Administered 2018-06-15: 8 mg via INTRAVENOUS
  Filled 2018-06-15: qty 2

## 2018-06-15 MED ORDER — POTASSIUM CHLORIDE CRYS ER 20 MEQ PO TBCR
20.0000 meq | EXTENDED_RELEASE_TABLET | Freq: Every day | ORAL | Status: DC
  Administered 2018-06-17 – 2018-06-19 (×3): 20 meq via ORAL
  Filled 2018-06-15 (×3): qty 1

## 2018-06-15 MED ORDER — DIPHENHYDRAMINE HCL 12.5 MG/5ML PO ELIX
12.5000 mg | ORAL_SOLUTION | Freq: Four times a day (QID) | ORAL | Status: DC | PRN
  Filled 2018-06-15: qty 5

## 2018-06-15 MED ORDER — SODIUM CHLORIDE 0.9 % IV BOLUS
1000.0000 mL | Freq: Once | INTRAVENOUS | Status: AC
  Administered 2018-06-15: 1000 mL via INTRAVENOUS

## 2018-06-15 MED ORDER — CYCLOBENZAPRINE HCL 10 MG PO TABS
5.0000 mg | ORAL_TABLET | Freq: Three times a day (TID) | ORAL | Status: DC | PRN
  Administered 2018-06-18 – 2018-06-19 (×2): 5 mg via ORAL
  Filled 2018-06-15 (×3): qty 1

## 2018-06-15 MED ORDER — ACETAMINOPHEN 325 MG PO TABS: 325.0000 mg | ORAL_TABLET | ORAL | Status: DC | PRN

## 2018-06-15 MED ORDER — IOHEXOL 300 MG/ML  SOLN
100.0000 mL | Freq: Once | INTRAMUSCULAR | Status: AC | PRN
  Administered 2018-06-15: 100 mL via INTRAVENOUS

## 2018-06-15 MED ORDER — METRONIDAZOLE IN NACL 5-0.79 MG/ML-% IV SOLN
500.0000 mg | Freq: Three times a day (TID) | INTRAVENOUS | Status: DC
  Administered 2018-06-15 – 2018-06-19 (×11): 500 mg via INTRAVENOUS
  Filled 2018-06-15 (×12): qty 100

## 2018-06-15 MED ORDER — KCL IN DEXTROSE-NACL 20-5-0.45 MEQ/L-%-% IV SOLN
INTRAVENOUS | Status: DC
  Administered 2018-06-15: 21:00:00 via INTRAVENOUS
  Filled 2018-06-15 (×2): qty 1000

## 2018-06-15 MED ORDER — ONDANSETRON HCL 4 MG/2ML IJ SOLN
4.0000 mg | Freq: Four times a day (QID) | INTRAMUSCULAR | Status: DC | PRN
  Administered 2018-06-15 – 2018-06-19 (×12): 4 mg via INTRAVENOUS
  Filled 2018-06-15 (×13): qty 2

## 2018-06-15 MED ORDER — PROCHLORPERAZINE MALEATE 10 MG PO TABS
10.0000 mg | ORAL_TABLET | Freq: Four times a day (QID) | ORAL | Status: DC | PRN
  Administered 2018-06-17: 10 mg via ORAL
  Filled 2018-06-15 (×2): qty 1

## 2018-06-15 MED ORDER — ONDANSETRON HCL 4 MG/2ML IJ SOLN
4.0000 mg | Freq: Once | INTRAMUSCULAR | Status: AC
  Administered 2018-06-15: 4 mg via INTRAVENOUS
  Filled 2018-06-15: qty 2

## 2018-06-15 MED ORDER — MORPHINE SULFATE (PF) 2 MG/ML IV SOLN
2.0000 mg | Freq: Once | INTRAVENOUS | Status: AC
  Administered 2018-06-15: 2 mg via INTRAVENOUS
  Filled 2018-06-15: qty 1

## 2018-06-15 NOTE — ED Provider Notes (Signed)
[facility] EMERGENCY DEPARTMENT Provider Note   CSN: [id number] Arrival date & time: 06/15/18  1127    History   Chief Complaint Chief Complaint  Patient presents with   Nausea   Emesis   Dizziness    HPI Jane Brooks is a 48 y.o. female s/p whipple for pancreatic cancer 04/25/18 presenting to emergency department today with chief complaint of abdominal pain.  Onset was acute starting 12 hours prior to arrival.  Patient states she woke up from sleep with epigastric pain, and while having a bowel movement she became nauseas and had multiple episodes of brown foamy emesis. She estimates 10 episodes prior to arrival. She describes the abdominal pain is located in the epigastric region. Pain is constant, and radiates to right and left upper quadrants.  She rates the pain 8 out of 10 in severity.  She took her home dose PO morphine and Phenergan however immediately vomited after taking. Patient states yesterday she felt the best she has since her surgery and her food intake included a banana, shrimp and broccoli, strawberry ice cream.  This is the most she has eaten since her surgery.  She admits to passing flatus. She denies any fever, chills, chest pain, cough, urinary symptoms, diarrhea, melena.  Of note she had whipple with by Dr. Barry Dienes and her hospital stay was complicated by sepsis and HCAP.  Patient reports home health came last week and drew blood, she was told she had a hemoglobin around 6 that she has not had follow up for. History provided by patient with additional history obtained from chart review.     Past Medical History:  Diagnosis Date   Family history of lung cancer    Family history of prostate cancer    Family history of uterine cancer    Gallstones 10/2017   Pancreatic cancer ([place])    Pneumothorax, closed, traumatic    years ago   PONV (postoperative nausea and vomiting)     Patient Active Problem List   Diagnosis Date Noted    Hypokalemia 05/22/2018   Peripheral edema    Prediabetes    Acute blood loss anemia    Thrombocytopenia (HCC)    Urinary retention    Sinus tachycardia    Debility 05/16/2018   Acute respiratory failure with hypoxemia ([place]) 04/30/2018   HCAP (healthcare-associated pneumonia) 04/30/2018   Essential hypertension 04/30/2018   Chronic prescription opiate use 04/30/2018   Postoperative pain 04/30/2018   Tachycardia 04/30/2018   Tachypnea 04/30/2018   Acute respiratory alkalosis 04/30/2018   Adenocarcinoma of head of pancreas ([place]) 04/25/2018   Genetic testing 01/24/2018   Family history of uterine cancer    Family history of prostate cancer    Family history of lung cancer    Pancreatic cancer ([place]) 11/09/2017   Pancreatic mass    Cholecystitis 10/30/2017   Jaundice 10/30/2017    Past Surgical History:  Procedure Laterality Date   BILIARY STENT PLACEMENT  10/31/2017   Procedure: BILIARY STENT PLACEMENT;  Surgeon: [name], MD;  Location: [place];  Service: Gastroenterology;;   ERCP N/A 10/31/2017   Procedure: ENDOSCOPIC RETROGRADE CHOLANGIOPANCREATOGRAPHY (ERCP);  Surgeon: [name], MD;  Location: [facility] ENDOSCOPY;  Service: Gastroenterology;  Laterality: N/A;   ESOPHAGOGASTRODUODENOSCOPY N/A 11/08/2017   Procedure: ESOPHAGOGASTRODUODENOSCOPY (EGD);  Surgeon: Milus Banister, MD;  Location: [facility] ENDOSCOPY;  Service: Endoscopy;  Laterality: N/A;   EUS N/A 11/08/2017   Procedure: UPPER ENDOSCOPIC ULTRASOUND (EUS) RADIAL;  Surgeon: Milus Banister, MD;  Location: [facility] ENDOSCOPY;  Service: Endoscopy;  Laterality: N/A;   FINE NEEDLE ASPIRATION  11/08/2017   Procedure: FINE NEEDLE ASPIRATION;  Surgeon: Milus Banister, MD;  Location: [facility] ENDOSCOPY;  Service: Endoscopy;;   IR IMAGING GUIDED PORT INSERTION  11/15/2017   LAPAROSCOPY N/A 04/25/2018   Procedure: LAPAROSCOPY DIAGNOSTIC;  Surgeon: Stark Klein, MD;  Location: [place];  Service: General;   Laterality: N/A;  GENERAL AND EPIDURAL ANESTHESIA   SPHINCTEROTOMY  10/31/2017   Procedure: SPHINCTEROTOMY;  Surgeon: [name], MD;  Location: [place];  Service: Gastroenterology;;   TONSILLECTOMY     WHIPPLE PROCEDURE N/A 04/25/2018   Procedure: WHIPPLE PROCEDURE, RECONSTRUCTION OF PORTAL VEIN;  Surgeon: Stark Klein, MD;  Location: [place];  Service: General;  Laterality: N/A;     OB History   No obstetric history on file.      Home Medications    Prior to Admission medications   Medication Sig Start Date End Date Taking? Authorizing Provider  acetaminophen (TYLENOL) 325 MG tablet Take 1-2 tablets (325-650 mg total) by mouth every 4 (four) hours as needed for mild pain. 05/21/18   Love, Ivan Anchors, PA-C  bethanechol (URECHOLINE) 10 MG tablet Take 1 tablet (10 mg total) by mouth 2 (two) times a day. 05/21/18   Love, Ivan Anchors, PA-C  cyclobenzaprine (FLEXERIL) 5 MG tablet Take 1 tablet (5 mg total) by mouth 3 (three) times daily as needed for muscle spasms. 05/24/18   Meredith Staggers, MD  lipase/protease/amylase (CREON) 36000 UNITS CPEP capsule Take 1 capsule (36,000 Units total) by mouth 3 (three) times daily. 05/21/18   Love, Ivan Anchors, PA-C  methocarbamol (ROBAXIN) 500 MG tablet Take 1 tablet (500 mg total) by mouth every 6 (six) hours as needed for muscle spasms. 05/16/18   Stark Klein, MD  morphine (MSIR) 15 MG tablet Take 1 tablet (15 mg total) by mouth every 4 (four) hours as needed for severe pain. 05/21/18   Love, Ivan Anchors, PA-C  ondansetron (ZOFRAN) 8 MG tablet Take 1 tablet (8 mg total) by mouth every 8 (eight) hours as needed for nausea or vomiting. 05/21/18   Love, Ivan Anchors, PA-C  pantoprazole (PROTONIX) 40 MG tablet Take 1 tablet (40 mg total) by mouth daily. 05/21/18   Love, Ivan Anchors, PA-C  potassium chloride SA (K-DUR) 20 MEQ tablet Take 1 tablet (20 mEq total) by mouth daily. 05/21/18   Love, Ivan Anchors, PA-C  promethazine (PHENERGAN) 25 MG tablet Take 1 tablet (25 mg total) by  mouth every 6 (six) hours as needed for nausea or vomiting. 11/08/17   Isla Pence, MD  Water For Irrigation, Sterile (FREE WATER) SOLN Place 100 mLs into feeding tube 2 (two) times daily. Use filtered water 05/21/18   Love, Ivan Anchors, PA-C    Family History Family History  Problem Relation Age of Onset   Diabetes Mother    Hypertension Mother    COPD Mother    Uterine cancer Mother 11       had hysterectomy   Diabetes Sister    Hypertension Sister    Lung cancer Maternal Grandmother        lung cancer   Hypertension Sister     Social History Social History   Tobacco Use   Smoking status: Former Smoker    Packs/day: 1.00    Years: 37.00    Pack years: 37.00    Types: Cigarettes    Last attempt to quit: 04/25/2018    Years since quitting:  0.1   Smokeless tobacco: Never Used  Substance Use Topics   Alcohol use: Not Currently   Drug use: Never     Allergies   Latex   Review of Systems Review of Systems  Constitutional: Negative for chills and fever.  HENT: Negative for congestion, ear discharge, ear pain, sinus pressure, sinus pain and sore throat.   Eyes: Negative for pain and redness.  Respiratory: Negative for cough and shortness of breath.   Cardiovascular: Negative for chest pain.  Gastrointestinal: Positive for abdominal pain, nausea and vomiting. Negative for constipation and diarrhea.  Genitourinary: Negative for dysuria and hematuria.  Musculoskeletal: Negative for back pain and neck pain.  Skin: Negative for wound.  Neurological: Negative for weakness, numbness and headaches.     Physical Exam Updated Vital Signs BP (!) 143/80    Pulse 76    Temp 98.5 F (36.9 C) (Oral)    Resp 15    Ht 4' 11"  (1.499 m)    Wt 53.5 kg    LMP  (LMP Unknown)    SpO2 98%    BMI 23.83 kg/m   Physical Exam Vitals signs and nursing note reviewed.  Constitutional:      General: She is not in acute distress.    Appearance: She is not ill-appearing.  HENT:      Head: Normocephalic and atraumatic.     Right Ear: Tympanic membrane and external ear normal.     Left Ear: Tympanic membrane and external ear normal.     Nose: Nose normal.     Mouth/Throat:     Mouth: Mucous membranes are dry.     Pharynx: Oropharynx is clear.  Eyes:     General: No scleral icterus.       Right eye: No discharge.        Left eye: No discharge.     Extraocular Movements: Extraocular movements intact.     Conjunctiva/sclera: Conjunctivae normal.     Pupils: Pupils are equal, round, and reactive to light.  Neck:     Musculoskeletal: Normal range of motion.     Vascular: No JVD.  Cardiovascular:     Rate and Rhythm: Normal rate and regular rhythm.     Pulses: Normal pulses.          Radial pulses are 2+ on the right side and 2+ on the left side.     Heart sounds: Normal heart sounds.  Pulmonary:     Comments: Lungs clear to auscultation in all fields. Symmetric chest rise. No wheezing, rales, or rhonchi. Abdominal:       Comments: Abdomen is soft, non-distended. Tenderness to palpation of epigastric region and left + right upper quadrants with guarding. No rigidity. No peritoneal signs.  Musculoskeletal: Normal range of motion.  Skin:    General: Skin is warm and dry.     Capillary Refill: Capillary refill takes less than 2 seconds.  Neurological:     Mental Status: She is oriented to person, place, and time.     GCS: GCS eye subscore is 4. GCS verbal subscore is 5. GCS motor subscore is 6.     Comments: Fluent speech, no facial droop.  Psychiatric:        Behavior: Behavior normal.      ED Treatments / Results  Labs (all labs ordered are listed, but only abnormal results are displayed) Labs Reviewed  COMPREHENSIVE METABOLIC PANEL - Abnormal; Notable for the following components:      Result  Value   BUN <5 (*)    Calcium 8.7 (*)    Albumin 2.9 (*)    All other components within normal limits  CBC WITH DIFFERENTIAL/PLATELET - Abnormal; Notable for  the following components:   Hemoglobin 10.6 (*)    HCT 32.8 (*)    MCV 73.9 (*)    MCH 23.9 (*)    RDW 22.0 (*)    All other components within normal limits  LIPASE, BLOOD  URINALYSIS, ROUTINE W REFLEX MICROSCOPIC    EKG None  Radiology Ct Abdomen Pelvis W Contrast  Result Date: 06/15/2018 CLINICAL DATA:  Nausea, vomiting. Recent Whipple procedure for pancreatic cancer. EXAM: CT ABDOMEN AND PELVIS WITH CONTRAST TECHNIQUE: Multidetector CT imaging of the abdomen and pelvis was performed using the standard protocol following bolus administration of intravenous contrast. CONTRAST:  145m OMNIPAQUE IOHEXOL 300 MG/ML  SOLN COMPARISON:  05/09/2018 FINDINGS: Lower chest: Lung bases are clear. No effusions. Heart is normal size. Hepatobiliary: Severe diffuse fatty infiltration of the liver. There appears to been migration of the previously seen pancreatic stent now traversing the choledocho-jejunostomy and in the biliary system. No biliary ductal dilatation. Prior cholecystectomy. Pancreas: Prior Whipple procedure. Previously seen pancreatic ductal stent now within the biliary system/ducts as above. Spleen: No focal abnormality.  Normal size. Adrenals/Urinary Tract: No adrenal abnormality. No focal renal abnormality. No stones or hydronephrosis. Urinary bladder is unremarkable. Stomach/Bowel: There is wall thickening within the ascending colon and hepatic flexure. Stomach and small bowel decompressed, grossly unremarkable. Postoperative changes within the stomach and small bowel as related to prior Whipple procedure. Vascular/Lymphatic: Aortic atherosclerosis. No enlarged abdominal or pelvic lymph nodes. Reproductive: Uterus and adnexa unremarkable.  No mass. Other: Small amount of free fluid in the pelvis.  No free air. Musculoskeletal: No acute bony abnormality. IMPRESSION: Changes of prior Whipple procedure. Previously seen pancreatic duct stent presumably has migrated across the choledocho-jejunostomy  and is now located in the biliary ducts. No biliary or pancreatic ductal dilatation. Severe diffuse low-density throughout the liver, likely fatty infiltration. Wall thickening within the ascending colon and hepatic flexure concerning for colitis. Small amount of free fluid in the pelvis. Aortic atherosclerosis. Electronically Signed   By: KRolm BaptiseM.D.   On: 06/15/2018 15:33    Procedures Procedures (including critical care time)  Medications Ordered in ED Medications  ondansetron (ZOFRAN) injection 4 mg (4 mg Intravenous Given 06/15/18 1249)  morphine 2 MG/ML injection 2 mg (2 mg Intravenous Given 06/15/18 1249)  sodium chloride 0.9 % bolus 1,000 mL (0 mLs Intravenous Stopped 06/15/18 1424)  promethazine (PHENERGAN) injection 12.5 mg (12.5 mg Intravenous Given 06/15/18 1431)  morphine 4 MG/ML injection 4 mg (4 mg Intravenous Given 06/15/18 1438)  iohexol (OMNIPAQUE) 300 MG/ML solution 100 mL (100 mLs Intravenous Contrast Given 06/15/18 1450)     Initial Impression / Assessment and Plan / ED Course  I have reviewed the triage vital signs and the nursing notes.  Pertinent labs & imaging results that were available during my care of the patient were reviewed by me and considered in my medical decision making (see chart for details).  Pt is afebrile, overall well-appearing. She has 2 episodes of emesis during my exam. She is tender throughout upper quadrant. CMP and lipase unremarkable. CBC with improved hemoglobin of 10.6, on chart review she has ranged from 8-9 x 1 month ago. Anemia is unchanged. Pt given morphine and zofran for nauseau without improvement. IV phenergan also without improvement. She is declining more medicine  until she knows the result of the scan.  CT A/P is concerning for colitis and small amount of free fluid in pelvis.  Patient given home dose of morphine and on reassessment abdominal pain had slightly improved.  No peritoneal signs.  Consulted Dr. Barry Dienes who recommends  admission to the hospital given lack of pain and nausea control and unable to tolerate PO.  Final Clinical Impressions(s) / ED Diagnoses   Final diagnoses:  Abdominal pain, unspecified abdominal location  Nausea and vomiting, intractability of vomiting not specified, unspecified vomiting type    ED Discharge Orders    None       Flint Melter 06/15/18 2144    Davonna Belling, MD 06/18/18 1513

## 2018-06-15 NOTE — ED Notes (Signed)
ED TO INPATIENT HANDOFF REPORT  ED Nurse Name and Phone #: [phone]  S Name/Age/Gender Jane Brooks 48 y.o. female Room/Bed: 041C/041C  Code Status   Code Status: Prior  Home/SNF/Other Home Patient oriented to: self, place, time and situation Is this baseline? Yes   Triage Complete: Triage complete  Chief Complaint Post op problem/vomitting with pancreatic cancer  Triage Note Pt presents with c/o NV and dizziness since 0200 this am. Pt states she was recently discharged May 5th after having the whipple procedure due to pancreatic cancer. Pt states she has taken her antiemetics w/o relief. Pt states her home health nurse recommended she come to the ER. Pt A+Ox4. Pt states she is in pain as a result of not being able to keep her pain medication down. Pt denies fevers/cough/SOB.    Allergies Allergies  Allergen Reactions  . Latex Hives    Level of Care/Admitting Diagnosis ED Disposition    ED Disposition Condition [facility] Area: [place] [[id number]]  Level of Care: Med-Surg [16]  Covid Evaluation: Screening Protocol (No Symptoms)  Diagnosis: Colitis [299242]  Admitting Physician: Stark Klein [[id number]]  Attending Physician: Stark Klein [[id number]]  Estimated length of stay: past midnight tomorrow  Certification:: I certify this patient will need inpatient services for at least 2 midnights  Bed request comments: [place] or [address] [facility]  PT Class (Do Not Modify): Inpatient [101]  PT Acc Code (Do Not Modify): Private [1]       B Medical/Surgery History Past Medical History:  Diagnosis Date  . Family history of lung cancer   . Family history of prostate cancer   . Family history of uterine cancer   . Gallstones 10/2017  . Pancreatic cancer ([place])   . Pneumothorax, closed, traumatic    years ago  . PONV (postoperative nausea and vomiting)    Past Surgical History:  Procedure Laterality Date  . BILIARY STENT PLACEMENT  10/31/2017    Procedure: BILIARY STENT PLACEMENT;  Surgeon: [name], MD;  Location: [facility] ENDOSCOPY;  Service: Gastroenterology;;  . ERCP N/A 10/31/2017   Procedure: ENDOSCOPIC RETROGRADE CHOLANGIOPANCREATOGRAPHY (ERCP);  Surgeon: [name], MD;  Location: [facility] ENDOSCOPY;  Service: Gastroenterology;  Laterality: N/A;  . ESOPHAGOGASTRODUODENOSCOPY N/A 11/08/2017   Procedure: ESOPHAGOGASTRODUODENOSCOPY (EGD);  Surgeon: Milus Banister, MD;  Location: [facility] ENDOSCOPY;  Service: Endoscopy;  Laterality: N/A;  . EUS N/A 11/08/2017   Procedure: UPPER ENDOSCOPIC ULTRASOUND (EUS) RADIAL;  Surgeon: Milus Banister, MD;  Location: [facility] ENDOSCOPY;  Service: Endoscopy;  Laterality: N/A;  . FINE NEEDLE ASPIRATION  11/08/2017   Procedure: FINE NEEDLE ASPIRATION;  Surgeon: Milus Banister, MD;  Location: [facility] ENDOSCOPY;  Service: Endoscopy;;  . IR IMAGING GUIDED PORT INSERTION  11/15/2017  . LAPAROSCOPY N/A 04/25/2018   Procedure: LAPAROSCOPY DIAGNOSTIC;  Surgeon: Stark Klein, MD;  Location: [place];  Service: General;  Laterality: N/A;  GENERAL AND EPIDURAL ANESTHESIA  . SPHINCTEROTOMY  10/31/2017   Procedure: SPHINCTEROTOMY;  Surgeon: [name], MD;  Location: [facility] ENDOSCOPY;  Service: Gastroenterology;;  . TONSILLECTOMY    . WHIPPLE PROCEDURE N/A 04/25/2018   Procedure: WHIPPLE PROCEDURE, RECONSTRUCTION OF PORTAL VEIN;  Surgeon: Stark Klein, MD;  Location: [place];  Service: General;  Laterality: N/A;     A IV Location/Drains/Wounds Patient Lines/Drains/Airways Status   Active Line/Drains/Airways    Name:   Placement date:   Placement time:   Site:   Days:   Implanted Port 11/15/17 Chest   11/15/17  9381    Chest   212   Closed System Drain 1 Medial RLQ Other (Comment) 19 Fr.   04/25/18    1416    RLQ   51   Gastrostomy/Enterostomy Jejunostomy LUQ   04/26/18    -    LUQ   50   GI Stent 8.5 Fr.   10/31/17    1110    -   227   Incision (Closed) 04/25/18 Abdomen Other (Comment)   04/25/18    1333     51           Intake/Output Last 24 hours  Intake/Output Summary (Last 24 hours) at 06/15/2018 1647 Last data filed at 06/15/2018 1424 Gross per 24 hour  Intake 950 ml  Output -  Net 950 ml    Labs/Imaging Results for orders placed or performed during the hospital encounter of 06/15/18 (from the past 48 hour(s))  Comprehensive metabolic panel     Status: Abnormal   Collection Time: 06/15/18 12:51 PM  Result Value Ref Range   Sodium 137 135 - 145 mmol/L   Potassium 3.9 3.5 - 5.1 mmol/L   Chloride 104 98 - 111 mmol/L   CO2 24 22 - 32 mmol/L   Glucose, Bld 93 70 - 99 mg/dL   BUN <5 (L) 6 - 20 mg/dL   Creatinine, Ser 0.61 0.44 - 1.00 mg/dL   Calcium 8.7 (L) 8.9 - 10.3 mg/dL   Total Protein 6.5 6.5 - 8.1 g/dL   Albumin 2.9 (L) 3.5 - 5.0 g/dL   AST 34 15 - 41 U/L   ALT 19 0 - 44 U/L   Alkaline Phosphatase 95 38 - 126 U/L   Total Bilirubin 0.7 0.3 - 1.2 mg/dL   GFR calc non Af Amer >60 >60 mL/min   GFR calc Af Amer >60 >60 mL/min   Anion gap 9 5 - 15    Comment: Performed at [facility], [place] [address], [place], [place] [address]  CBC with Differential     Status: Abnormal   Collection Time: 06/15/18 12:51 PM  Result Value Ref [place]   WBC 5.3 4.0 - 10.5 K/uL   RBC 4.44 3.87 - 5.11 MIL/uL   Hemoglobin 10.6 (L) 12.0 - 15.0 g/dL   HCT 32.8 (L) 36.0 - 46.0 %   MCV 73.9 (L) 80.0 - 100.0 fL   MCH 23.9 (L) 26.0 - 34.0 pg   MCHC 32.3 30.0 - 36.0 g/dL   RDW 22.0 (H) 11.5 - 15.5 %   Platelets 194 150 - 400 K/uL    Comment: REPEATED TO VERIFY   nRBC 0.0 0.0 - 0.2 %   Neutrophils Relative % 77 %   Neutro Abs 4.0 1.7 - 7.7 K/uL   Lymphocytes Relative 17 %   Lymphs Abs 0.9 0.7 - 4.0 K/uL   Monocytes Relative 6 %   Monocytes Absolute 0.3 0.1 - 1.0 K/uL   Eosinophils Relative 0 %   Eosinophils Absolute 0.0 0.0 - 0.5 K/uL   Basophils Relative 0 %   Basophils Absolute 0.0 0.0 - 0.1 K/uL   Immature Granulocytes 0 %   Abs Immature Granulocytes 0.01 0.00 - 0.07 K/uL   Schistocytes PRESENT      Comment: Performed at [facility], [place] [address] [place], [place] [address]  Lipase, blood     Status: None   Collection Time: 06/15/18 12:51 PM  Result Value Ref [place]   Lipase 17 11 -  51 U/L    Comment: Performed at [facility], [place] [address], [place], [place] [address]  Urinalysis, Routine w reflex microscopic     Status: None   Collection Time: 06/15/18  2:43 PM  Result Value Ref [place]   Color, Urine YELLOW YELLOW   APPearance CLEAR CLEAR   Specific Gravity, Urine 1.017 1.005 - 1.030   pH 7.0 5.0 - 8.0   Glucose, UA NEGATIVE NEGATIVE mg/dL   Hgb urine dipstick NEGATIVE NEGATIVE   Bilirubin Urine NEGATIVE NEGATIVE   Ketones, ur NEGATIVE NEGATIVE mg/dL   Protein, ur NEGATIVE NEGATIVE mg/dL   Nitrite NEGATIVE NEGATIVE   Leukocytes,Ua NEGATIVE NEGATIVE    Comment: Performed at [facility] [address], [place], [place] [address]   Ct Abdomen Pelvis W Contrast  Result Date: 06/15/2018 CLINICAL DATA:  Nausea, vomiting. Recent Whipple procedure for pancreatic cancer. EXAM: CT ABDOMEN AND PELVIS WITH CONTRAST TECHNIQUE: Multidetector CT imaging of the abdomen and pelvis was performed using the standard protocol following bolus administration of intravenous contrast. CONTRAST:  133m OMNIPAQUE IOHEXOL 300 MG/ML  SOLN COMPARISON:  05/09/2018 FINDINGS: Lower chest: Lung bases are clear. No effusions. Heart is normal size. Hepatobiliary: Severe diffuse fatty infiltration of the liver. There appears to been migration of the previously seen pancreatic stent now traversing the choledocho-jejunostomy and in the biliary system. No biliary ductal dilatation. Prior cholecystectomy. Pancreas: Prior Whipple procedure. Previously seen pancreatic ductal stent now within the biliary system/ducts as above. Spleen: No focal abnormality.  Normal size. Adrenals/Urinary Tract: No adrenal abnormality. No focal renal abnormality. No stones or hydronephrosis. Urinary bladder is  unremarkable. Stomach/Bowel: There is wall thickening within the ascending colon and hepatic flexure. Stomach and small bowel decompressed, grossly unremarkable. Postoperative changes within the stomach and small bowel as related to prior Whipple procedure. Vascular/Lymphatic: Aortic atherosclerosis. No enlarged abdominal or pelvic lymph nodes. Reproductive: Uterus and adnexa unremarkable.  No mass. Other: Small amount of free fluid in the pelvis.  No free air. Musculoskeletal: No acute bony abnormality. IMPRESSION: Changes of prior Whipple procedure. Previously seen pancreatic duct stent presumably has migrated across the choledocho-jejunostomy and is now located in the biliary ducts. No biliary or pancreatic ductal dilatation. Severe diffuse low-density throughout the liver, likely fatty infiltration. Wall thickening within the ascending colon and hepatic flexure concerning for colitis. Small amount of free fluid in the pelvis. Aortic atherosclerosis. Electronically Signed   By: KRolm BaptiseM.D.   On: 06/15/2018 15:33    Pending Labs [employer](From admission, onward)    Start     Ordered   Signed and Held  CBC  (enoxaparin (LOVENOX)    CrCl >/= 30 ml/min)  Once,   R    Comments:  Baseline for enoxaparin therapy IF NOT ALREADY DRAWN.  Notify MD if PLT < 100 K.    Signed and Held   Signed and Held  Creatinine, serum  (enoxaparin (LOVENOX)    CrCl >/= 30 ml/min)  Once,   R    Comments:  Baseline for enoxaparin therapy IF NOT ALREADY DRAWN.    Signed and Held   Signed and Held  Creatinine, serum  (enoxaparin (LOVENOX)    CrCl >/= 30 ml/min)  Weekly,   R    Comments:  while on enoxaparin therapy    Signed and Held   Signed and Held  Basic metabolic panel  Tomorrow morning,   R     Signed and Held   Signed  and Held  CBC  Tomorrow morning,   R     Signed and Held   Signed and Held  Magnesium  Tomorrow morning,   R     Signed and Held   Signed and Held  Phosphorus  Tomorrow morning,   R      Signed and Held          Vitals/Pain Today's Vitals   06/15/18 1428 06/15/18 1428 06/15/18 1430 06/15/18 1638  BP: 130/90  (!) 143/80 (!) 149/83  Pulse: 80  76 67  Resp: 12  15 14   Temp:      TempSrc:      SpO2: 97%  98% 99%  Weight:      Height:      PainSc:  8       Isolation Precautions No active isolations  Medications Medications  morphine 4 MG/ML injection 8 mg (has no administration in time range)  ondansetron (ZOFRAN) injection 4 mg (4 mg Intravenous Given 06/15/18 1249)  morphine 2 MG/ML injection 2 mg (2 mg Intravenous Given 06/15/18 1249)  sodium chloride 0.9 % bolus 1,000 mL (0 mLs Intravenous Stopped 06/15/18 1424)  promethazine (PHENERGAN) injection 12.5 mg (12.5 mg Intravenous Given 06/15/18 1431)  morphine 4 MG/ML injection 4 mg (4 mg Intravenous Given 06/15/18 1438)  iohexol (OMNIPAQUE) 300 MG/ML solution 100 mL (100 mLs Intravenous Contrast Given 06/15/18 1450)  promethazine (PHENERGAN) injection 12.5 mg (12.5 mg Intravenous Given 06/15/18 1640)    Mobility walks Low fall risk   Focused Assessments    R Recommendations: See Admitting Provider Note  Report given to:   Additional Notes:

## 2018-06-15 NOTE — ED Triage Notes (Signed)
Pt presents with c/o NV and dizziness since 0200 this am. Pt states she was recently discharged May 5th after having the whipple procedure due to pancreatic cancer. Pt states she has taken her antiemetics w/o relief. Pt states her home health nurse recommended she come to the ER. Pt A+Ox4. Pt states she is in pain as a result of not being able to keep her pain medication down. Pt denies fevers/cough/SOB.

## 2018-06-15 NOTE — H&P (Signed)
Jane Brooks is an 48 y.o. female.   Chief Complaint: n/v/abdominal pain HPI:  Patient is a 48 year old female who is around 7 to 8 weeks status post Whipple with portal vein reconstruction.  She had a complicated postop course with significant blood loss and leukocytosis.  She also developed pneumonia and required postop ventilator support.  She had empiric antibiotics for around 10 days because of leukocytosis and fever.  She developed some liver necrosis.  Drains were sent for lipase prior to being pulled and a demonstrated no evidence of pancreatic leak.  She had an elevated ammonia with decreased mental status and lactulose was started during her hospitalization.  She has been doing relatively well increasing her diet at home.  I saw her around a week ago in clinic.  She was weaning her pain medication.  However, this morning around 230 she was going to the bathroom and developed sudden nausea.  She threw up around 10 times.  She did not throw up food that she ate yesterday.  She describes it as foamy bile.  She is not able to keep down any of her nausea medication.  Her abdominal pain was increasing.  [facility] and they recommended coming to the emergency department.  She denies fevers and chills.  She denies diarrhea.  Past Medical History:  Diagnosis Date  . Family history of lung cancer   . Family history of prostate cancer   . Family history of uterine cancer   . Gallstones 10/2017  . Pancreatic cancer ([place])   . Pneumothorax, closed, traumatic    years ago  . PONV (postoperative nausea and vomiting)     Past Surgical History:  Procedure Laterality Date  . BILIARY STENT PLACEMENT  10/31/2017   Procedure: BILIARY STENT PLACEMENT;  Surgeon: [name], MD;  Location: [facility] ENDOSCOPY;  Service: Gastroenterology;;  . ERCP N/A 10/31/2017   Procedure: ENDOSCOPIC RETROGRADE CHOLANGIOPANCREATOGRAPHY (ERCP);  Surgeon: [name], MD;  Location: [facility] ENDOSCOPY;   Service: Gastroenterology;  Laterality: N/A;  . ESOPHAGOGASTRODUODENOSCOPY N/A 11/08/2017   Procedure: ESOPHAGOGASTRODUODENOSCOPY (EGD);  Surgeon: Milus Banister, MD;  Location: [facility] ENDOSCOPY;  Service: Endoscopy;  Laterality: N/A;  . EUS N/A 11/08/2017   Procedure: UPPER ENDOSCOPIC ULTRASOUND (EUS) RADIAL;  Surgeon: Milus Banister, MD;  Location: [facility] ENDOSCOPY;  Service: Endoscopy;  Laterality: N/A;  . FINE NEEDLE ASPIRATION  11/08/2017   Procedure: FINE NEEDLE ASPIRATION;  Surgeon: Milus Banister, MD;  Location: [facility] ENDOSCOPY;  Service: Endoscopy;;  . IR IMAGING GUIDED PORT INSERTION  11/15/2017  . LAPAROSCOPY N/A 04/25/2018   Procedure: LAPAROSCOPY DIAGNOSTIC;  Surgeon: Stark Klein, MD;  Location: [place];  Service: General;  Laterality: N/A;  GENERAL AND EPIDURAL ANESTHESIA  . SPHINCTEROTOMY  10/31/2017   Procedure: SPHINCTEROTOMY;  Surgeon: [name], MD;  Location: [facility] ENDOSCOPY;  Service: Gastroenterology;;  . TONSILLECTOMY    . WHIPPLE PROCEDURE N/A 04/25/2018   Procedure: WHIPPLE PROCEDURE, RECONSTRUCTION OF PORTAL VEIN;  Surgeon: Stark Klein, MD;  Location: [place];  Service: General;  Laterality: N/A;    Family History  Problem Relation Age of Onset  . Diabetes Mother   . Hypertension Mother   . COPD Mother   . Uterine cancer Mother 68       had hysterectomy  . Diabetes Sister   . Hypertension Sister   . Lung cancer Maternal Grandmother        lung cancer  . Hypertension Sister    Social History:  reports that she quit smoking about 7 weeks ago. Her smoking use included cigarettes. She has a 37.00 pack-year smoking history. She has never used smokeless tobacco. She reports previous alcohol use. She reports that she does not use drugs.  Allergies:  Allergies  Allergen Reactions  . Latex Hives   Current Meds  Medication Sig  . acetaminophen (TYLENOL) 325 MG tablet Take 1-2 tablets (325-650 mg total) by mouth every 4 (four) hours as needed for mild pain.  .  cyclobenzaprine (FLEXERIL) 5 MG tablet Take 1 tablet (5 mg total) by mouth 3 (three) times daily as needed for muscle spasms.  . lipase/protease/amylase (CREON) 36000 UNITS CPEP capsule Take 1 capsule (36,000 Units total) by mouth 3 (three) times daily.  . methocarbamol (ROBAXIN) 500 MG tablet Take 1 tablet (500 mg total) by mouth every 6 (six) hours as needed for muscle spasms.  Marland Kitchen morphine (MSIR) 15 MG tablet Take 1 tablet (15 mg total) by mouth every 4 (four) hours as needed for severe pain.  Marland Kitchen ondansetron (ZOFRAN) 8 MG tablet Take 1 tablet (8 mg total) by mouth every 8 (eight) hours as needed for nausea or vomiting.  . pantoprazole (PROTONIX) 40 MG tablet Take 1 tablet (40 mg total) by mouth daily.  . potassium chloride SA (K-DUR) 20 MEQ tablet Take 1 tablet (20 mEq total) by mouth daily.  . promethazine (PHENERGAN) 25 MG tablet Take 1 tablet (25 mg total) by mouth every 6 (six) hours as needed for nausea or vomiting.  . Water For Irrigation, Sterile (FREE WATER) SOLN Place 100 mLs into feeding tube 2 (two) times daily. Use filtered water     Results for orders placed or performed during the hospital encounter of 06/15/18 (from the past 48 hour(s))  Comprehensive metabolic panel     Status: Abnormal   Collection Time: 06/15/18 12:51 PM  Result Value Ref [place]   Sodium 137 135 - 145 mmol/L   Potassium 3.9 3.5 - 5.1 mmol/L   Chloride 104 98 - 111 mmol/L   CO2 24 22 - 32 mmol/L   Glucose, Bld 93 70 - 99 mg/dL   BUN <5 (L) 6 - 20 mg/dL   Creatinine, Ser 0.61 0.44 - 1.00 mg/dL   Calcium 8.7 (L) 8.9 - 10.3 mg/dL   Total Protein 6.5 6.5 - 8.1 g/dL   Albumin 2.9 (L) 3.5 - 5.0 g/dL   AST 34 15 - 41 U/L   ALT 19 0 - 44 U/L   Alkaline Phosphatase 95 38 - 126 U/L   Total Bilirubin 0.7 0.3 - 1.2 mg/dL   GFR calc non Af Amer >60 >60 mL/min   GFR calc Af Amer >60 >60 mL/min   Anion gap 9 5 - 15    Comment: Performed at [facility], [address] [place], [place] [address]  CBC with  Differential     Status: Abnormal   Collection Time: 06/15/18 12:51 PM  Result Value Ref Range   WBC 5.3 4.0 - 10.5 K/uL   RBC 4.44 3.87 - 5.11 MIL/uL   Hemoglobin 10.6 (L) 12.0 - 15.0 g/dL   HCT 32.8 (L) 36.0 - 46.0 %   MCV 73.9 (L) 80.0 - 100.0 fL   MCH 23.9 (L) 26.0 - 34.0 pg   MCHC 32.3 30.0 - 36.0 g/dL   RDW 22.0 (H) 11.5 - 15.5 %   Platelets 194 150 - 400 K/uL    Comment: REPEATED TO VERIFY   nRBC 0.0 0.0 -  0.2 %   Neutrophils Relative % 77 %   Neutro Abs 4.0 1.7 - 7.7 K/uL   Lymphocytes Relative 17 %   Lymphs Abs 0.9 0.7 - 4.0 K/uL   Monocytes Relative 6 %   Monocytes Absolute 0.3 0.1 - 1.0 K/uL   Eosinophils Relative 0 %   Eosinophils Absolute 0.0 0.0 - 0.5 K/uL   Basophils Relative 0 %   Basophils Absolute 0.0 0.0 - 0.1 K/uL   Immature Granulocytes 0 %   Abs Immature Granulocytes 0.01 0.00 - 0.07 K/uL   Schistocytes PRESENT     Comment: Performed at [facility] [address], [place], [place] [address]  Lipase, blood     Status: None   Collection Time: 06/15/18 12:51 PM  Result Value Ref [place]   Lipase 17 11 - 51 U/L    Comment: Performed at [facility], [place] [address] [place], [place] [address]  Urinalysis, Routine w reflex microscopic     Status: None   Collection Time: 06/15/18  2:43 PM  Result Value Ref Range   Color, Urine YELLOW YELLOW   APPearance CLEAR CLEAR   Specific Gravity, Urine 1.017 1.005 - 1.030   pH 7.0 5.0 - 8.0   Glucose, UA NEGATIVE NEGATIVE mg/dL   Hgb urine dipstick NEGATIVE NEGATIVE   Bilirubin Urine NEGATIVE NEGATIVE   Ketones, ur NEGATIVE NEGATIVE mg/dL   Protein, ur NEGATIVE NEGATIVE mg/dL   Nitrite NEGATIVE NEGATIVE   Leukocytes,Ua NEGATIVE NEGATIVE    Comment: Performed at [facility] [address] [place], [place] [address]   Ct Abdomen Pelvis W Contrast  Result Date: 06/15/2018 CLINICAL DATA:  Nausea, vomiting. Recent Whipple procedure for pancreatic cancer. EXAM: CT ABDOMEN AND PELVIS WITH CONTRAST  TECHNIQUE: Multidetector CT imaging of the abdomen and pelvis was performed using the standard protocol following bolus administration of intravenous contrast. CONTRAST:  195m OMNIPAQUE IOHEXOL 300 MG/ML  SOLN COMPARISON:  05/09/2018 FINDINGS: Lower chest: Lung bases are clear. No effusions. Heart is normal size. Hepatobiliary: Severe diffuse fatty infiltration of the liver. There appears to been migration of the previously seen pancreatic stent now traversing the choledocho-jejunostomy and in the biliary system. No biliary ductal dilatation. Prior cholecystectomy. Pancreas: Prior Whipple procedure. Previously seen pancreatic ductal stent now within the biliary system/ducts as above. Spleen: No focal abnormality.  Normal size. Adrenals/Urinary Tract: No adrenal abnormality. No focal renal abnormality. No stones or hydronephrosis. Urinary bladder is unremarkable. Stomach/Bowel: There is wall thickening within the ascending colon and hepatic flexure. Stomach and small bowel decompressed, grossly unremarkable. Postoperative changes within the stomach and small bowel as related to prior Whipple procedure. Vascular/Lymphatic: Aortic atherosclerosis. No enlarged abdominal or pelvic lymph nodes. Reproductive: Uterus and adnexa unremarkable.  No mass. Other: Small amount of free fluid in the pelvis.  No free air. Musculoskeletal: No acute bony abnormality. IMPRESSION: Changes of prior Whipple procedure. Previously seen pancreatic duct stent presumably has migrated across the choledocho-jejunostomy and is now located in the biliary ducts. No biliary or pancreatic ductal dilatation. Severe diffuse low-density throughout the liver, likely fatty infiltration. Wall thickening within the ascending colon and hepatic flexure concerning for colitis. Small amount of free fluid in the pelvis. Aortic atherosclerosis. Electronically Signed   By: KRolm BaptiseM.D.   On: 06/15/2018 15:33    Review of Systems  Constitutional:  Negative.   HENT: Negative.   Eyes: Negative.   Respiratory: Negative.   Cardiovascular: Negative.   Gastrointestinal: Positive  for abdominal pain, nausea and vomiting.  Genitourinary: Negative.   Musculoskeletal: Negative.   Skin: Negative.   Neurological: Negative.   Endo/Heme/Allergies: Negative.   Psychiatric/Behavioral: Negative.     Blood pressure (!) 143/80, pulse 76, temperature 98.5 F (36.9 C), temperature source Oral, resp. rate 15, height 4' 11"  (1.499 m), weight 53.5 kg, SpO2 98 %. Physical Exam  Constitutional: She is oriented to person, place, and time. She appears well-developed. She appears distressed.  Thin   HENT:  Head: Normocephalic and atraumatic.  Right Ear: External ear normal.  Left Ear: External ear normal.  Eyes: Pupils are equal, round, and reactive to light. Conjunctivae are normal. No scleral icterus.  Neck: Neck supple.  Cardiovascular: Normal rate, regular rhythm and intact distal pulses.  No murmur heard. Respiratory: Effort normal. No respiratory distress. She exhibits no tenderness.  GI: Soft. She exhibits no distension and no mass. There is abdominal tenderness (mild upper abdominal tenderness). There is no rebound and no guarding.  Musculoskeletal: Normal range of motion.        General: No tenderness or edema.  Neurological: She is alert and oriented to person, place, and time.  Skin: Skin is warm and dry. No rash noted. She is not diaphoretic. No erythema. No pallor.  Psychiatric: She has a normal mood and affect. Her behavior is normal. Judgment and thought content normal.     Assessment/Plan Colitis Pancreatic cancer, ypT2N1, s/p whipple with portal vein reconstruction 04/25/18 N/V Anemia of chronic disease Moderate protein calorie malnutrition.  Admit to floor IV fluids Pain control Nausea control Antibiotics for colitis. Check prealbumin.    Hopefully will not need long hospitalization    Stark Klein, MD 06/15/2018, 4:23  PM

## 2018-06-16 LAB — CBC
HCT: 32.5 % — ABNORMAL LOW (ref 36.0–46.0)
Hemoglobin: 10.7 g/dL — ABNORMAL LOW (ref 12.0–15.0)
MCH: 24 pg — ABNORMAL LOW (ref 26.0–34.0)
MCHC: 32.9 g/dL (ref 30.0–36.0)
MCV: 72.9 fL — ABNORMAL LOW (ref 80.0–100.0)
Platelets: 202 10*3/uL (ref 150–400)
RBC: 4.46 MIL/uL (ref 3.87–5.11)
RDW: 21.3 % — ABNORMAL HIGH (ref 11.5–15.5)
WBC: 14.9 10*3/uL — ABNORMAL HIGH (ref 4.0–10.5)
nRBC: 0 % (ref 0.0–0.2)

## 2018-06-16 LAB — BASIC METABOLIC PANEL
Anion gap: 8 (ref 5–15)
BUN: <5 mg/dL — ABNORMAL LOW (ref 6–20)
CO2: 25 mmol/L (ref 22–32)
Calcium: 8.5 mg/dL — ABNORMAL LOW (ref 8.9–10.3)
Chloride: 102 mmol/L (ref 98–111)
Creatinine, Ser: 0.57 mg/dL (ref 0.44–1.00)
Glucose, Bld: 159 mg/dL — ABNORMAL HIGH (ref 70–99)
Potassium: 3.4 mmol/L — ABNORMAL LOW (ref 3.5–5.1)
Sodium: 135 mmol/L (ref 135–145)

## 2018-06-16 LAB — PREALBUMIN: Prealbumin: 9.1 mg/dL — ABNORMAL LOW (ref 18–38)

## 2018-06-16 LAB — MAGNESIUM: Magnesium: 1.4 mg/dL — ABNORMAL LOW (ref 1.7–2.4)

## 2018-06-16 LAB — PHOSPHORUS: Phosphorus: 3.8 mg/dL (ref 2.5–4.6)

## 2018-06-16 MED ORDER — KCL IN DEXTROSE-NACL 20-5-0.45 MEQ/L-%-% IV SOLN: INTRAVENOUS | Status: DC

## 2018-06-16 MED ORDER — KCL IN DEXTROSE-NACL 40-5-0.45 MEQ/L-%-% IV SOLN
INTRAVENOUS | Status: DC
  Administered 2018-06-16 – 2018-06-19 (×6): via INTRAVENOUS
  Filled 2018-06-16 (×9): qty 1000

## 2018-06-16 NOTE — Progress Notes (Signed)
Subjective: Alert and cooperative.  Mental status normal.  Says she feels a little better but still nauseated and had some low volume emesis during the night.  She says this is greenish-black.  No blood.Marland Kitchen  Has had some low volume stools.  Able to ambulate to bathroom and void without difficulty.  Still sore  Afebrile.  Heart rate 68.  SPO2 99%.  Hemodynamically stable.  Creatinine 0.57.  Glucose 159.  Potassium 3.4.  WBC 14,900.  Hemoglobin 10.7, stable.   Objective: Vital signs in last 24 hours: Temp:  [97.9 F (36.6 C)-98.5 F (36.9 C)] 97.9 F (36.6 C) (05/31 0533) Pulse Rate:  [67-82] 68 (05/31 0533) Resp:  [12-21] 18 (05/31 0533) BP: (121-167)/(80-90) 144/83 (05/31 0533) SpO2:  [97 %-100 %] 99 % (05/31 0533) Weight:  [53.5 kg] 53.5 kg (05/30 1146)    Intake/Output from previous day: 05/30 0701 - 05/31 0700 In: 1885.9 [I.V.:735.9; IV Piggyback:1150] Out: -  Intake/Output this shift: Total I/O In: 935.9 [I.V.:735.9; IV Piggyback:200] Out: -     PE:  General: Alert.  Oriented.  Mental status normal.  Minimal distress Lungs: Clear to auscultation bilaterally Abdomen: Midline incision well-healed without hernia or infection.  Diffuse tenderness.  Perhaps a bit more in the upper abdomen.  No sign of peritonitis but she does guard a little bit. Extremity: No tenderness or edema   Lab Results:  Recent Labs    06/15/18 1251 06/16/18 0430  WBC 5.3 14.9*  HGB 10.6* 10.7*  HCT 32.8* 32.5*  PLT 194 202   BMET Recent Labs    06/15/18 1251 06/16/18 0430  NA 137 135  K 3.9 3.4*  CL 104 102  CO2 24 25  GLUCOSE 93 159*  BUN <5* <5*  CREATININE 0.61 0.57  CALCIUM 8.7* 8.5*   PT/INR No results for input(s): LABPROT, INR in the last 72 hours. ABG No results for input(s): PHART, HCO3 in the last 72 hours.  Invalid input(s): PCO2, PO2  Studies/Results: Ct Abdomen Pelvis W Contrast  Result Date: 06/15/2018 CLINICAL DATA:  Nausea, vomiting. Recent Whipple  procedure for pancreatic cancer. EXAM: CT ABDOMEN AND PELVIS WITH CONTRAST TECHNIQUE: Multidetector CT imaging of the abdomen and pelvis was performed using the standard protocol following bolus administration of intravenous contrast. CONTRAST:  179m OMNIPAQUE IOHEXOL 300 MG/ML  SOLN COMPARISON:  05/09/2018 FINDINGS: Lower chest: Lung bases are clear. No effusions. Heart is normal size. Hepatobiliary: Severe diffuse fatty infiltration of the liver. There appears to been migration of the previously seen pancreatic stent now traversing the choledocho-jejunostomy and in the biliary system. No biliary ductal dilatation. Prior cholecystectomy. Pancreas: Prior Whipple procedure. Previously seen pancreatic ductal stent now within the biliary system/ducts as above. Spleen: No focal abnormality.  Normal size. Adrenals/Urinary Tract: No adrenal abnormality. No focal renal abnormality. No stones or hydronephrosis. Urinary bladder is unremarkable. Stomach/Bowel: There is wall thickening within the ascending colon and hepatic flexure. Stomach and small bowel decompressed, grossly unremarkable. Postoperative changes within the stomach and small bowel as related to prior Whipple procedure. Vascular/Lymphatic: Aortic atherosclerosis. No enlarged abdominal or pelvic lymph nodes. Reproductive: Uterus and adnexa unremarkable.  No mass. Other: Small amount of free fluid in the pelvis.  No free air. Musculoskeletal: No acute bony abnormality. IMPRESSION: Changes of prior Whipple procedure. Previously seen pancreatic duct stent presumably has migrated across the choledocho-jejunostomy and is now located in the biliary ducts. No biliary or pancreatic ductal dilatation. Severe diffuse low-density throughout the liver, likely fatty infiltration. Wall thickening  within the ascending colon and hepatic flexure concerning for colitis. Small amount of free fluid in the pelvis. Aortic atherosclerosis. Electronically Signed   By: Rolm Baptise  M.D.   On: 06/15/2018 15:33    Anti-infectives: Anti-infectives (From admission, onward)   Start     Dose/Rate Route Frequency Ordered Stop   06/15/18 1900  ceFEPIme (MAXIPIME) 2 g in sodium chloride 0.9 % 100 mL IVPB     2 g 200 mL/hr over 30 Minutes Intravenous Every 8 hours 06/15/18 1813     06/15/18 1900  metroNIDAZOLE (FLAGYL) IVPB 500 mg     500 mg 100 mL/hr over 60 Minutes Intravenous Every 8 hours 06/15/18 1813        Assessment/Plan:  Colitis Pancreatic cancer, ypT2N1, s/p whipple with portal vein reconstruction 04/25/18 N/V--most likely related to colitis Anemia of chronic disease Moderate protein calorie malnutrition--albumin 2.9.  Prealbumin pending.  IV fluids.   IV antibiotics for colitis.  Maxipime and Flagyl. Pain and nausea control. May need nutritional support if GI function does not normalize quickly    LOS: 1 day    Jane Brooks 06/16/2018

## 2018-06-17 ENCOUNTER — Ambulatory Visit: Payer: Medicaid Other | Admitting: Hematology

## 2018-06-17 ENCOUNTER — Inpatient Hospital Stay: Payer: Medicaid Other

## 2018-06-17 ENCOUNTER — Other Ambulatory Visit: Payer: Medicaid Other

## 2018-06-17 DIAGNOSIS — E876 Hypokalemia: Secondary | ICD-10-CM

## 2018-06-17 DIAGNOSIS — D649 Anemia, unspecified: Secondary | ICD-10-CM

## 2018-06-17 DIAGNOSIS — C250 Malignant neoplasm of head of pancreas: Secondary | ICD-10-CM

## 2018-06-17 DIAGNOSIS — K529 Noninfective gastroenteritis and colitis, unspecified: Principal | ICD-10-CM

## 2018-06-17 LAB — COMPREHENSIVE METABOLIC PANEL
ALT: 19 U/L (ref 0–44)
AST: 35 U/L (ref 15–41)
Albumin: 2.8 g/dL — ABNORMAL LOW (ref 3.5–5.0)
Alkaline Phosphatase: 80 U/L (ref 38–126)
Anion gap: 8 (ref 5–15)
BUN: <5 mg/dL — ABNORMAL LOW (ref 6–20)
CO2: 23 mmol/L (ref 22–32)
Calcium: 8.4 mg/dL — ABNORMAL LOW (ref 8.9–10.3)
Chloride: 103 mmol/L (ref 98–111)
Creatinine, Ser: 0.54 mg/dL (ref 0.44–1.00)
GFR calc Af Amer: >60 mL/min (ref >60)
GFR calc non Af Amer: >60 mL/min (ref >60)
Glucose, Bld: 126 mg/dL — ABNORMAL HIGH (ref 70–99)
Potassium: 3.4 mmol/L — ABNORMAL LOW (ref 3.5–5.1)
Sodium: 134 mmol/L — ABNORMAL LOW (ref 135–145)
Total Bilirubin: 0.9 mg/dL (ref 0.3–1.2)
Total Protein: 6.5 g/dL (ref 6.5–8.1)

## 2018-06-17 LAB — CBC
HCT: 31.7 % — ABNORMAL LOW (ref 36.0–46.0)
Hemoglobin: 10.3 g/dL — ABNORMAL LOW (ref 12.0–15.0)
MCH: 23.6 pg — ABNORMAL LOW (ref 26.0–34.0)
MCHC: 32.5 g/dL (ref 30.0–36.0)
MCV: 72.5 fL — ABNORMAL LOW (ref 80.0–100.0)
Platelets: 202 10*3/uL (ref 150–400)
RBC: 4.37 MIL/uL (ref 3.87–5.11)
RDW: 21 % — ABNORMAL HIGH (ref 11.5–15.5)
WBC: 7.7 10*3/uL (ref 4.0–10.5)
nRBC: 0 % (ref 0.0–0.2)

## 2018-06-17 MED ORDER — ENSURE ENLIVE PO LIQD
237.0000 mL | Freq: Three times a day (TID) | ORAL | Status: DC
  Administered 2018-06-17 – 2018-06-18 (×3): 237 mL via ORAL

## 2018-06-17 NOTE — Progress Notes (Addendum)
HEMATOLOGY-ONCOLOGY PROGRESS NOTE  SUBJECTIVE: Patient admitted due to abdominal pain, nausea, vomiting. On IV antibiotics for colitis. Reports abdominal pain is improving with pain meds. Still having nausea and vomiting, but able to keep sips of liquid down. No diarrhea. Thinks that she is improved overall since prior to admission. Afebrile, other VSS, no other new complaints.   Oncology History   Cancer Staging Pancreatic cancer [facility]) Staging form: Exocrine Pancreas, AJCC 8th Edition - Clinical stage from 11/08/2017: Stage IB (cT2, cN0, cM0) - Signed by Truitt Merle, MD on 11/09/2017       Pancreatic cancer ([place])   10/30/2017 Imaging    MRI Abdomen 10/30/17  IMPRESSION: 1. Marked intra and extrahepatic biliary duct dilatation with abrupt cut off of the common bile duct at the pancreatic head. This is associated with marked dilatation of the main pancreatic duct also with an abrupt cut off at the level of the pancreatic head. Pancreatic parenchyma in the body and tail of pancreas is markedly atrophic. A subtle 2.5 x 3 cm lesion with differential signal intensity is identified in the head of the pancreas which appears slightly hypoenhancing on postcontrast imaging. Together, these features are highly concerning for pancreatic adenocarcinoma. EUS is recommended to further evaluate. 2. Portal vein is patent although there is some mass-effect on the portal splenic confluence and IVC. Fat planes around celiac axis and SMA appear preserved. 3. Upper normal to borderline portal caval lymph node associated with small right para-aortic lymphadenopathy.      10/30/2017 Tumor Marker    Baseline Ca 19-9 at 140    10/31/2017 Procedure    ERCP by Dr. Lyndel Safe 10/31/17  IMPRESSION Malignant appearing CBD stricture (due to pancreatic mass) status post sphincterotomy and stent insertion.    11/08/2017 Cancer Staging    Staging form: Exocrine Pancreas, AJCC 8th Edition - Clinical stage from  11/08/2017: Stage IB (cT2, cN0, cM0) - Signed by Truitt Merle, MD on 11/09/2017    11/08/2017 Procedure    EUS and EGD by Dr. Ardis Hughs 11/08/17  IMPRESSION -Very vague, irregularly bordered, approximately 2.6cm mass was found in the head of pancreas. This causes biliary and pancreatic duct obstruction. The previously plastic biliary stent is in good position. The mass directly abuts and attenuates the PV/SMV confluence, suggesting invasion. FNA performed and the preliminary cytology review is positive for maligancy (adenocarcinoma). - [facility] office will arrange referrals to medical and surgical oncology. - Case to be discussed at upcoming multidisciplinary GI tumor conference.    11/08/2017 Initial Biopsy    Biopsy Cytology 11/08/17 Diagnosis FINE NEEDLE ASPIRATION, ENDOSCOPIC, PANCREAS HEAD (SPECIMEN 1 OF 1 COLLECTED 11/08/17): MALIGNANT CELLS CONSISTENT WITH ADENOCARCINOMA.    11/09/2017 Initial Diagnosis    Pancreatic cancer ([place])    11/15/2017 Imaging    CT Chest and Pelvis WO Contrast 11/15/17 IMPRESSION: 1. No findings of active malignancy in the chest or pelvis. 2. Aortic Atherosclerosis (ICD10-I70.0). Coronary atherosclerosis. 3. Faint centrilobular nodularity in the lung apices, raising the possibility of hypersensitivity pneumonitis or less likely respiratory bronchiolitis. Airway thickening is present, suggesting bronchitis or reactive airways disease.    11/19/2017 - 03/07/2018 Chemotherapy    FOLFIRINOX q2weeks with Neulasta injection for 4 months starting 11/19/17. Completed 8 cycles on 03/07/18     02/04/2018 Imaging    CT AP W Contrast 02/04/18  IMPRESSION: 1. No substantial change in size of mass involving head of pancreas. A aortocaval lymph node which appears increased in the interval measuring 1.1 cm versus 0.8 cm  previously. Stable 1.2 cm peripancreatic node. 2. Dilated proximal common bile duct is mildly increased in caliber compared with 12/12/2017. The  common bile duct stent remains in place in appears to be in appropriate position.     04/19/2018 Imaging    CT AP 04/19/18  IMPRESSION: 1. Essentially stable exam compared to most recent CT scans of 02/04/2018 and 12/12/2017. 2. Stable mass in the head of the pancreas with duct dilatation and atrophy upstream. Mass continues to surround the GDA and contacts the portal vein but is free of the celiac trunk and SMA. Mass probably contacts the RIGHT hepatic artery at the takeoff of the GDA (image 56/5). Dominant LEFT hepatic artery originates from the LEFT gastric. 3. Mild intrahepatic and extrahepatic duct dilatation similar to prior with flexible stent in the common bile duct. 4. No new or progressive disease. 5. No peritoneal metastasis.    04/25/2018 Imaging    CT Angio chest 04/25/18  IMPRESSION: 1. No demonstrable pulmonary embolus. No thoracic aortic aneurysm or dissection. 2. Areas of atelectatic change bilaterally. No consolidation. No pneumothorax. Endotracheal tube tip is slightly superior to the carina. 3. Enlarged subcarinal lymph node, concerning for neoplastic etiology. 4. 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 Biliary stent present. Biliary duct air is felt to be secondary to the stent placement. 5. Mild upper abdominal ascites. Pneumoperitoneum with apparent drainage catheter present. The pneumoperitoneum is likely of post procedural/postoperative etiology. Bowel perforation in this circumstance cannot be excluded entirely, however. Close clinical assessment in this regard advised. 6.  Left ventricular hypertrophy.    04/25/2018 Surgery    LAPAROSCOPY DIAGNOSTIC and WHIPPLE PROCEDURE, RECONSTRUCTION OF PORTAL VEIN by Dr. Barry Dienes, Dr. Marcello Moores 04/25/18     04/25/2018 Pathology Results     Diagnosis 04/25/18 1. Whipple procedure/resection, with gallbladder with segment of portal vein - INVASIVE ADENOCARCINOMA, MODERATELY DIFFERENTIATED, SPANNING 3.5  CM. - ADENOCARCINOMA IS PRESENT AT THE SUPERIOR MARGIN OF THE ATTACHED VEIN AND THE INFERIOR MARGIN OF THE ATTACHED VEIN (BLOCKS E AND F). - ADENOCARCINOMA EXTENDS INTO PERIPANCREATIC SOFT TISSUE. - PERINEURAL INVASION IS IDENTIFIED, DIFFUSE. - METASTATIC ADENOCARCINOMA IN 1 OF 24 LYMPH NODES (1/24). - SEE ONCOLOGY TABLE BELOW. 2. Lymph node, biopsy, portal - THERE IS NO EVIDENCE OF CARCINOMA IN 5 OF 5 LYMPH NODES (0/5). 3. Pancreas, biopsy, additional pancreatic margin - HIGH GRADE GLANDULAR DYSPLASIA, FOCAL       REVIEW OF SYSTEMS:   Constitutional: Denies fevers, chills Eyes: Denies blurriness of vision Ears, nose, mouth, throat, and face: Denies mucositis or sore throat Respiratory: Denies cough, dyspnea or wheezes Cardiovascular: Denies palpitation, chest discomfort Gastrointestinal:  Reports abdominal pain is better with pain meds. Still having nausea and vomiting. No bowel changes.  Skin: Denies abnormal skin rashes Lymphatics: Denies new lymphadenopathy or easy bruising Neurological:Denies numbness, tingling or new weaknesses Behavioral/Psych: Mood is stable, no new changes  Extremities: No lower extremity edema All other systems were reviewed with the patient and are negative.  I have reviewed the past medical history, past surgical history, social history and family history with the patient and they are unchanged from previous note.   PHYSICAL EXAMINATION:  Vitals:   06/17/18 0335 06/17/18 1156  BP: (!) 152/83 (!) 172/87  Pulse: 67   Resp:    Temp: 98.2 F (36.8 C) 98.1 F (36.7 C)  SpO2: 100%    Filed Weights   06/15/18 1146  Weight: 118 lb (53.5 kg)    Intake/Output from previous day: 05/31 0701 -  06/01 0700 In: 3169.6 [P.O.:600; I.V.:1893.2; IV Piggyback:676.5] Out: 951 [Urine:351; Emesis/NG output:600]  GENERAL:alert, no distress and comfortable SKIN: skin color, texture, turgor are normal, no rashes or significant lesions EYES: normal,  Conjunctiva are pink and non-injected, sclera clear OROPHARYNX:no exudate, no erythema and lips, buccal mucosa, and tongue normal  LUNGS: clear to auscultation and percussion with normal breathing effort HEART: regular rate & rhythm and no murmurs and no lower extremity edema ABDOMEN: Midline incision well-healed. +BS. Mild tenderness with palpation.  Musculoskeletal:no cyanosis of digits and no clubbing  NEURO: alert & oriented x 3 with fluent speech, no focal motor/sensory deficits  LABORATORY DATA:  I have reviewed the data as listed CMP Latest Ref Rng & Units 06/17/2018 06/16/2018 06/15/2018  Glucose 70 - 99 mg/dL 126(H) 159(H) 93  BUN 6 - 20 mg/dL <5(L) <5(L) <5(L)  Creatinine 0.44 - 1.00 mg/dL 0.54 0.57 0.61  Sodium 135 - 145 mmol/L 134(L) 135 137  Potassium 3.5 - 5.1 mmol/L 3.4(L) 3.4(L) 3.9  Chloride 98 - 111 mmol/L 103 102 104  CO2 22 - 32 mmol/L 23 25 24   Calcium 8.9 - 10.3 mg/dL 8.4(L) 8.5(L) 8.7(L)  Total Protein 6.5 - 8.1 g/dL 6.5 - 6.5  Total Bilirubin 0.3 - 1.2 mg/dL 0.9 - 0.7  Alkaline Phos 38 - 126 U/L 80 - 95  AST 15 - 41 U/L 35 - 34  ALT 0 - 44 U/L 19 - 19    Lab Results  Component Value Date   WBC 7.7 06/17/2018   HGB 10.3 (L) 06/17/2018   HCT 31.7 (L) 06/17/2018   MCV 72.5 (L) 06/17/2018   PLT 202 06/17/2018   NEUTROABS 4.0 06/15/2018    Ct Abdomen Pelvis W Contrast  Result Date: 06/15/2018 CLINICAL DATA:  Nausea, vomiting. Recent Whipple procedure for pancreatic cancer. EXAM: CT ABDOMEN AND PELVIS WITH CONTRAST TECHNIQUE: Multidetector CT imaging of the abdomen and pelvis was performed using the standard protocol following bolus administration of intravenous contrast. CONTRAST:  134m OMNIPAQUE IOHEXOL 300 MG/ML  SOLN COMPARISON:  05/09/2018 FINDINGS: Lower chest: Lung bases are clear. No effusions. Heart is normal size. Hepatobiliary: Severe diffuse fatty infiltration of the liver. There appears to been migration of the previously seen pancreatic stent now  traversing the choledocho-jejunostomy and in the biliary system. No biliary ductal dilatation. Prior cholecystectomy. Pancreas: Prior Whipple procedure. Previously seen pancreatic ductal stent now within the biliary system/ducts as above. Spleen: No focal abnormality.  Normal size. Adrenals/Urinary Tract: No adrenal abnormality. No focal renal abnormality. No stones or hydronephrosis. Urinary bladder is unremarkable. Stomach/Bowel: There is wall thickening within the ascending colon and hepatic flexure. Stomach and small bowel decompressed, grossly unremarkable. Postoperative changes within the stomach and small bowel as related to prior Whipple procedure. Vascular/Lymphatic: Aortic atherosclerosis. No enlarged abdominal or pelvic lymph nodes. Reproductive: Uterus and adnexa unremarkable.  No mass. Other: Small amount of free fluid in the pelvis.  No free air. Musculoskeletal: No acute bony abnormality. IMPRESSION: Changes of prior Whipple procedure. Previously seen pancreatic duct stent presumably has migrated across the choledocho-jejunostomy and is now located in the biliary ducts. No biliary or pancreatic ductal dilatation. Severe diffuse low-density throughout the liver, likely fatty infiltration. Wall thickening within the ascending colon and hepatic flexure concerning for colitis. Small amount of free fluid in the pelvis. Aortic atherosclerosis. Electronically Signed   By: KRolm BaptiseM.D.   On: 06/15/2018 15:33    ASSESSMENT AND PLAN: 1. Colitis 2. Pancreatic cancer; s/p neoadjuvant chemo followed  by Whipple procedure on 04/25/2018 3. Nausea/vomiting 4. Anemia 5. Hypokalemia  -Continue IV antibiotics per Surgery -Scan results reviewed. No evidence of pancreatic cancer recurrence noted on CT abd/pelvis -Continue pain meds and antiemetics -Continue IVF -Consider dietitian consult  -Monitor CBC and transfuse for Hgb <7.0 or active bleeding.  -Continue K+ replacement   LOS: 2 days   Mikey Bussing, DNP, AGPCNP-BC, AOCNP 06/17/18  Addendum  I have seen the patient, examined her. I agree with the assessment and and plan and have edited the notes.   Pt was scheduled to see me in my clinic today, but was admitted for abdominal pain, N/V, likely secondary to colitis. I reviewed her CT scan, no evidence of cancer recurrence for now. I discussed with pt that she does not need adjuvant chemo for her pancreatic cancer, but she remains to be at high risk for recurrence. I plan to f/u her CA19.9 after discharge, and f/u CT scan in 3 months. If her GI symptoms do no resolve, may repeat scan sooner. I will see her as needed when she is in house.  Truitt Merle  06/17/2018

## 2018-06-17 NOTE — Progress Notes (Signed)
Subjective: Less nausea.  Pain improved.    Objective: Vital signs in last 24 hours: Temp:  [98.2 F (36.8 C)-98.6 F (37 C)] 98.2 F (36.8 C) (06/01 0335) Pulse Rate:  [63-72] 67 (06/01 0335) Resp:  [18-20] 18 (05/31 2020) BP: (134-170)/(78-83) 152/83 (06/01 0335) SpO2:  [100 %] 100 % (06/01 0335) Last BM Date: 06/16/18  Intake/Output from previous day: 05/31 0701 - 06/01 0700 In: 3169.6 [P.O.:600; I.V.:1893.2; IV Piggyback:676.5] Out: 951 [Urine:351; Emesis/NG output:600] Intake/Output this shift: No intake/output data recorded.    PE:  General: Alert.  Oriented.  Mental status normal.  No distress Lungs: Clear to auscultation bilaterally Abdomen: Midline incision well-healed without hernia or infection.  less tenderness.   Extremity: No tenderness or edema   Lab Results:  Recent Labs    06/16/18 0430 06/17/18 0400  WBC 14.9* 7.7  HGB 10.7* 10.3*  HCT 32.5* 31.7*  PLT 202 202   BMET Recent Labs    06/16/18 0430 06/17/18 0400  NA 135 134*  K 3.4* 3.4*  CL 102 103  CO2 25 23  GLUCOSE 159* 126*  BUN <5* <5*  CREATININE 0.57 0.54  CALCIUM 8.5* 8.4*   PT/INR No results for input(s): LABPROT, INR in the last 72 hours. ABG No results for input(s): PHART, HCO3 in the last 72 hours.  Invalid input(s): PCO2, PO2  Studies/Results: Ct Abdomen Pelvis W Contrast  Result Date: 06/15/2018 CLINICAL DATA:  Nausea, vomiting. Recent Whipple procedure for pancreatic cancer. EXAM: CT ABDOMEN AND PELVIS WITH CONTRAST TECHNIQUE: Multidetector CT imaging of the abdomen and pelvis was performed using the standard protocol following bolus administration of intravenous contrast. CONTRAST:  128m OMNIPAQUE IOHEXOL 300 MG/ML  SOLN COMPARISON:  05/09/2018 FINDINGS: Lower chest: Lung bases are clear. No effusions. Heart is normal size. Hepatobiliary: Severe diffuse fatty infiltration of the liver. There appears to been migration of the previously seen pancreatic stent now  traversing the choledocho-jejunostomy and in the biliary system. No biliary ductal dilatation. Prior cholecystectomy. Pancreas: Prior Whipple procedure. Previously seen pancreatic ductal stent now within the biliary system/ducts as above. Spleen: No focal abnormality.  Normal size. Adrenals/Urinary Tract: No adrenal abnormality. No focal renal abnormality. No stones or hydronephrosis. Urinary bladder is unremarkable. Stomach/Bowel: There is wall thickening within the ascending colon and hepatic flexure. Stomach and small bowel decompressed, grossly unremarkable. Postoperative changes within the stomach and small bowel as related to prior Whipple procedure. Vascular/Lymphatic: Aortic atherosclerosis. No enlarged abdominal or pelvic lymph nodes. Reproductive: Uterus and adnexa unremarkable.  No mass. Other: Small amount of free fluid in the pelvis.  No free air. Musculoskeletal: No acute bony abnormality. IMPRESSION: Changes of prior Whipple procedure. Previously seen pancreatic duct stent presumably has migrated across the choledocho-jejunostomy and is now located in the biliary ducts. No biliary or pancreatic ductal dilatation. Severe diffuse low-density throughout the liver, likely fatty infiltration. Wall thickening within the ascending colon and hepatic flexure concerning for colitis. Small amount of free fluid in the pelvis. Aortic atherosclerosis. Electronically Signed   By: KRolm BaptiseM.D.   On: 06/15/2018 15:33    Anti-infectives: Anti-infectives (From admission, onward)   Start     Dose/Rate Route Frequency Ordered Stop   06/15/18 1900  ceFEPIme (MAXIPIME) 2 g in sodium chloride 0.9 % 100 mL IVPB     2 g 200 mL/hr over 30 Minutes Intravenous Every 8 hours 06/15/18 1813     06/15/18 1900  metroNIDAZOLE (FLAGYL) IVPB 500 mg     500 mg  100 mL/hr over 60 Minutes Intravenous Every 8 hours 06/15/18 1813        Assessment/Plan:  Colitis Pancreatic cancer, ypT2N1, s/p whipple with portal vein  reconstruction 04/25/18 N/V--most likely related to colitis Anemia of chronic disease Moderate protein calorie malnutrition--albumin 2.9.  Prealbumin 9.1  IV fluids.   IV antibiotics for colitis.  Maxipime and Flagyl. Pain and nausea control. Threw up as I was leaving room.    May need nutritional support if GI function does not normalize quickly    LOS: 2 days    Stark Klein 06/17/2018

## 2018-06-17 NOTE — Progress Notes (Signed)
CSW completed readmission risk assessment. Patient states she does not have PCP. CSW informed RNCM, RNCM will follow up with the patient.   Thurmond Butts, MSW, [place] Social Worker [phone]

## 2018-06-17 NOTE — Progress Notes (Signed)
Initial Nutrition Assessment  RD working remotely.  DOCUMENTATION CODES:   Not applicable, unable to assess for malnutrition at this time without completion of NFPE  INTERVENTION:   - Please obtain new weight as admission weight appears to be pulled from previous encounter  - Magic cup TID with meals, each supplement provides 290 kcal and 9 grams of protein  - Ensure Enlive po TID, each supplement provides 350 kcal and 20 grams of protein  - If pt continues to be unable to tolerate POs, consider initiation of nutrition support.  NUTRITION DIAGNOSIS:   Inadequate oral intake related to nausea, vomiting as evidenced by meal completion < 25%, per patient/family report.  GOAL:   Patient will meet greater than or equal to 90% of their needs  MONITOR:   PO intake, Supplement acceptance, Diet advancement, Weight trends, I & O's, Labs  REASON FOR ASSESSMENT:   Malnutrition Screening Tool    ASSESSMENT:   48 year old female who presented to the ED on 5/30 with N/V and dizziness. PMH pancreatic cancer s/p Whipple with portal vein reconstruction 04/25/18, anemia. CT concerning for colitis.  Nausea and vomiting continue.  Weight on admission appears stated rather than measured. Please obtain new weight.  Spoke with pt via phone call to room. Pt frustrated as she had been doing well and eating well prior to episodes of N/V while moving her bowels a few nights ago.  Pt states that in the weeks after her discharge from CIR and prior to this admission, her appetite and PO intake had been improving. Noted pt consumed a banana, broccoli and shrimp, and strawberry ice cream the day PTA.  Pt states that she has not been able to keep anything down today. Pt is at risk for malnutrition. If pt remains unable to tolerate POs, recommend nutrition support.  Pt is willing to drink Ensure Enlive (strawberry flavor only) until able to tolerate more po. RD to order TID.  Meal Completion: 0% x 2  meals (full liquid diet)  Medications reviewed and include: Protonix, K-dur 20 mEq daily, IV abx IVF: D5 in 1/2NS with KCl @ 100 ml/hr  Labs reviewed: sodium 134 (L), potassium 3.4 (L), phosphorus 1.4 (L)  UOP: 351 ml + 2 unmeasured occurrences x 24 hours Emesis: 600 ml x 24 hours I/O's: +3.9 L since admit  NUTRITION - FOCUSED PHYSICAL EXAM:  Unable to complete at this time. RD working remotely.  Diet Order:   Diet Order            Diet full liquid Room service appropriate? Yes; Fluid consistency: Thin  Diet effective now              EDUCATION NEEDS:   Not appropriate for education at this time  Skin:  Skin Assessment: Reviewed RN Assessment  Last BM:  06/16/18  Height:   Ht Readings from Last 1 Encounters:  06/15/18 4' 11"  (1.499 m)    Weight:   Wt Readings from Last 1 Encounters:  06/15/18 53.5 kg    Ideal Body Weight:  43.2 kg  BMI:  Body mass index is 23.83 kg/m.  Estimated Nutritional Needs:   Kcal:  1700-1900  Protein:  80-95 grams  Fluid:  >/= 1.7 L    Gaynell Face, MS, RD, LDN Inpatient Clinical Dietitian Pager: [phone] Weekend/After Hours: [phone]

## 2018-06-18 ENCOUNTER — Inpatient Hospital Stay (HOSPITAL_COMMUNITY): Payer: Medicaid Other

## 2018-06-18 LAB — URINALYSIS, COMPLETE (UACMP) WITH MICROSCOPIC
Bacteria, UA: NONE SEEN
Bilirubin Urine: NEGATIVE
Glucose, UA: NEGATIVE mg/dL
Hgb urine dipstick: NEGATIVE
Ketones, ur: NEGATIVE mg/dL
Leukocytes,Ua: NEGATIVE
Nitrite: NEGATIVE
Protein, ur: NEGATIVE mg/dL
Specific Gravity, Urine: 1.01 (ref 1.005–1.030)
pH: 7 (ref 5.0–8.0)

## 2018-06-18 NOTE — Progress Notes (Signed)
  Subjective: Doing a little better.  C/o foul smelling urine.     Objective: Vital signs in last 24 hours: Temp:  [98.1 F (36.7 C)-98.4 F (36.9 C)] 98.4 F (36.9 C) (06/01 2001) Pulse Rate:  [83] 83 (06/01 2001) Resp:  [19] 19 (06/01 2001) BP: (163-172)/(87-93) 163/93 (06/01 2001) SpO2:  [97 %] 97 % (06/01 2001) Last BM Date: 06/16/18  Intake/Output from previous day: 06/01 0701 - 06/02 0700 In: -  Out: 250 [Emesis/NG output:250] Intake/Output this shift: No intake/output data recorded.    PE:  General: Alert.  Oriented.  Mental status normal.  No distress Lungs: Clear to auscultation bilaterally Abdomen: Midline incision well-healed without hernia or infection.  less tenderness.   Extremity: No tenderness or edema   Lab Results:  Recent Labs    06/16/18 0430 06/17/18 0400  WBC 14.9* 7.7  HGB 10.7* 10.3*  HCT 32.5* 31.7*  PLT 202 202   BMET Recent Labs    06/16/18 0430 06/17/18 0400  NA 135 134*  K 3.4* 3.4*  CL 102 103  CO2 25 23  GLUCOSE 159* 126*  BUN <5* <5*  CREATININE 0.57 0.54  CALCIUM 8.5* 8.4*   PT/INR No results for input(s): LABPROT, INR in the last 72 hours. ABG No results for input(s): PHART, HCO3 in the last 72 hours.  Invalid input(s): PCO2, PO2  Studies/Results: No results found.  Anti-infectives: Anti-infectives (From admission, onward)   Start     Dose/Rate Route Frequency Ordered Stop   06/15/18 1900  ceFEPIme (MAXIPIME) 2 g in sodium chloride 0.9 % 100 mL IVPB     2 g 200 mL/hr over 30 Minutes Intravenous Every 8 hours 06/15/18 1813     06/15/18 1900  metroNIDAZOLE (FLAGYL) IVPB 500 mg     500 mg 100 mL/hr over 60 Minutes Intravenous Every 8 hours 06/15/18 1813        Assessment/Plan:  Colitis Pancreatic cancer, [id number], s/p whipple with portal vein reconstruction 04/25/18 N/V--most likely related to colitis Anemia of chronic disease Moderate protein calorie malnutrition--albumin 2.9.  Prealbumin 9.1  IV  fluids.   IV antibiotics for colitis.  Maxipime and Flagyl. Still having quite a bit of nausea.   May need nutritional support if GI function does not normalize quickly Upper GI to evaluate gastric emptying Repeat U/A   LOS: 3 days    Stark Klein 06/18/2018

## 2018-06-19 MED ORDER — METOCLOPRAMIDE HCL 5 MG PO TABS: 5.0000 mg | ORAL_TABLET | Freq: Four times a day (QID) | ORAL | 1 refills | Status: DC | PRN

## 2018-06-19 MED ORDER — PANTOPRAZOLE SODIUM 40 MG PO TBEC: 40.0000 mg | DELAYED_RELEASE_TABLET | Freq: Every day | ORAL | 3 refills | Status: DC

## 2018-06-19 MED ORDER — AMOXICILLIN-POT CLAVULANATE 875-125 MG PO TABS: 1.0000 | ORAL_TABLET | Freq: Two times a day (BID) | ORAL | 0 refills | Status: DC

## 2018-06-19 MED ORDER — METOCLOPRAMIDE HCL 5 MG PO TABS: 5.0000 mg | ORAL_TABLET | Freq: Four times a day (QID) | ORAL | Status: DC | PRN

## 2018-06-19 MED ORDER — HEPARIN SOD (PORK) LOCK FLUSH 100 UNIT/ML IV SOLN
500.0000 [IU] | INTRAVENOUS | Status: AC | PRN
  Administered 2018-06-19: 500 [IU]

## 2018-06-19 MED ORDER — PROMETHAZINE HCL 25 MG PO TABS: 25.0000 mg | ORAL_TABLET | Freq: Four times a day (QID) | ORAL | 0 refills | Status: DC | PRN

## 2018-06-19 MED ORDER — AMOXICILLIN-POT CLAVULANATE 875-125 MG PO TABS
1.0000 | ORAL_TABLET | Freq: Two times a day (BID) | ORAL | Status: DC
  Administered 2018-06-19: 1 via ORAL
  Filled 2018-06-19: qty 1

## 2018-06-19 NOTE — TOC Transition Note (Signed)
Transition of Care [facility]) - CM/SW Discharge Note   Patient Details  Name: Jane Brooks MRN: [id number] Date of Birth: [birth year]  Transition of Care [facility]) CM/SW Contact:  Vinie Sill, [place] Phone Number: 06/19/2018, 12:43 PM   Clinical Narrative:    Patient is discharging home with home health. CSW gave RN taxi voucher for transportation.  Thurmond Butts, MSW, [facility] Clinical Social Worker [phone]   Final next level of care: Home w Home Health Services Barriers to Discharge: Barriers Resolved   Patient Goals and CMS Choice Patient states their goals for this hospitalization and ongoing recovery are:: return home CMS Medicare.gov Compare Post Acute Care list provided to:: Patient Choice offered to / list presented to : Patient  Discharge Placement                       Discharge Plan and Services In-house Referral: Clinical Social Work Discharge Planning Services: CM Consult Post Acute Care Choice: Resumption of Svcs/PTA Provider, Home Health                      [facility] Agency: [place] ([facility])        Social Determinants of Health (SDOH) Interventions     Readmission Risk Interventions Readmission Risk Prevention Plan 06/19/2018 05/16/2018  Transportation Screening - Complete  Medication Review Press photographer) - Complete  PCP or Specialist appointment within 3-5 days of discharge Complete Not Complete  PCP/Specialist Appt Not Complete comments - Pt discharging to [place] or Home Care Consult Complete Not Complete  HRI or Home Care Consult Pt Refusal Comments - Pt discharging to [place] Care/Counseling Consult Complete Not Complete  SW Consult Not Complete Comments - Pt discharging to CIR  Palliative Care Screening Not Applicable Not [place] Not Applicable Not Applicable

## 2018-06-19 NOTE — TOC Initial Note (Addendum)
Transition of Care (TOC) - Initial/Assessment Note  Marvetta Gibbons RN, BSN Transitions of Care Unit 4E- RN Case Manager [phone]   Patient Details  Name: Jane Brooks MRN: [id number] Date of Birth: 07/24/1970  Transition of Care [facility]) CM/SW Contact:    Dawayne Patricia, RN Phone Number: 06/19/2018, 12:11 PM  Clinical Narrative:                 Pt admitted with Colitis, notified by Butch Penny with [facility] that pt active with them for HHRN/PT- will need resumption orders for [facility] services at time of discharge. Pt also informed CSW that she needs PCP- Medicaid card shows clinic as [facility]- appointment made for June 9 at 1030- clinic will call pt to let her know if appointment will be telephonic or in person.   Expected Discharge Plan: [place] Barriers to Discharge: Continued Medical Work up   Patient Goals and CMS Choice Patient states their goals for this hospitalization and ongoing recovery are:: return home CMS Medicare.gov Compare Post Acute Care list provided to:: Patient Choice offered to / list presented to : Patient  Expected Discharge Plan and Services Expected Discharge Plan: [place] In-house Referral: Clinical Social Work Discharge Planning Services: CM Consult Post Acute Care Choice: Resumption of Svcs/PTA Provider, Home Health Living arrangements for the past 2 months: [place]: [place] ([facility])        Prior Living Arrangements/Services Living arrangements for the past 2 months: Apartment Lives with:: Self   Do you feel safe going back to the place where you live?: Yes      Need for Family Participation in Patient Care: Yes (Comment) Care giver support system in place?: Yes (comment) Current home services: Home RN, Home PT    Activities of Daily Living Home Assistive Devices/Equipment: None ADL Screening (condition at time of admission) Patient's cognitive  ability adequate to safely complete daily activities?: Yes Is the patient deaf or have difficulty hearing?: No Does the patient have difficulty seeing, even when wearing glasses/contacts?: No Does the patient have difficulty concentrating, remembering, or making decisions?: No Patient able to express need for assistance with ADLs?: Yes Does the patient have difficulty dressing or bathing?: No Independently performs ADLs?: Yes (appropriate for developmental age) Does the patient have difficulty walking or climbing stairs?: No Weakness of Legs: None Weakness of Arms/Hands: None  Permission Sought/Granted                  Emotional Assessment Appearance:: Appears stated age Attitude/Demeanor/Rapport: Engaged Affect (typically observed): Appropriate Orientation: : Oriented to Self, Oriented to Situation, Oriented to Place, Oriented to  Time   Psych Involvement: No (comment)  Admission diagnosis:  Abdominal pain, unspecified abdominal location [R10.9] Nausea and vomiting, intractability of vomiting not specified, unspecified vomiting type [R11.2] Patient Active Problem List   Diagnosis Date Noted  . Colitis 06/15/2018  . Hypokalemia 05/22/2018  . Peripheral edema   . Prediabetes   . Acute blood loss anemia   . Thrombocytopenia ([place])   . Urinary retention   . Sinus tachycardia   . Debility 05/16/2018  . Acute respiratory failure with hypoxemia ([place]) 04/30/2018  . HCAP (healthcare-associated pneumonia) 04/30/2018  . Essential hypertension 04/30/2018  . Chronic prescription opiate use 04/30/2018  . Postoperative pain 04/30/2018  .  Tachycardia 04/30/2018  . Tachypnea 04/30/2018  . Acute respiratory alkalosis 04/30/2018  . Adenocarcinoma of head of pancreas ([place]) 04/25/2018  . Genetic testing 01/24/2018  . Family history of uterine cancer   . Family history of prostate cancer   . Family history of lung cancer   . Pancreatic cancer ([place]) 11/09/2017  . Pancreatic mass   .  Cholecystitis 10/30/2017  . Jaundice 10/30/2017   PCP:  Patient, No Pcp Per Pharmacy:   [place], [place] - [address] [place] [address] Phone: [phone] Fax: [place], [place] [address] Phone: [phone] Fax: [phone]     Social Determinants of Health (SDOH) Interventions    Readmission Risk Interventions Readmission Risk Prevention Plan 05/16/2018  Transportation Screening Complete  Medication Review (RN Care Manager) Complete  PCP or Specialist appointment within 3-5 days of discharge Not Complete  PCP/Specialist Appt Not Complete comments Pt discharging to [place] or [place] Not Complete  HRI or Home Care Consult Pt Refusal Comments Pt discharging to [place] Care/Counseling Consult Not Complete  SW Consult Not Complete Comments Pt discharging to [place] Not Applicable

## 2018-06-19 NOTE — Discharge Instructions (Signed)
Colitis  Colitis is inflammation of the colon. Colitis may last a short time (be acute), or it may last a long time (become chronic). What are the causes? This condition may be caused by:  Viruses.  Bacteria.  Reaction to medicine.  Certain autoimmune diseases such as Crohn's disease or ulcerative colitis.  Radiation treatment.  Decreased blood flow to the bowel (ischemia). What are the signs or symptoms? Symptoms of this condition include:  Watery diarrhea.  Passing bloody or tarry stool.  Pain.  Fever.  Vomiting.  Tiredness (fatigue).  Weight loss.  Bloating.  Abdominal pain.  Having fewer bowel movements than usual.  A strong and sudden urge to have a bowel movement.  Feeling like the bowel is not empty after a bowel movement. How is this diagnosed? This condition is diagnosed with a stool test or a blood test. You may also have other tests, such as:  X-rays.  CT scan.  Colonoscopy.  Endoscopy.  Biopsy. How is this treated? Treatment for this condition depends on the cause. The condition may be treated by:  Resting the bowel. This involves not eating or drinking for a period of time.  Fluids that are given through an IV.  Medicine for pain and diarrhea.  Antibiotic medicines.  Cortisone medicines.  Surgery. Follow these instructions at home: Eating and drinking   Follow instructions from your health care provider about eating or drinking restrictions.  Drink enough fluid to keep your urine pale yellow.  Work with a dietitian to determine which foods cause your condition to flare up.  Avoid foods that cause flare-ups.  Eat a well-balanced diet. General instructions  If you were prescribed an antibiotic medicine, take it as told by your health care provider. Do not stop taking the antibiotic even if you start to feel better.  Take over-the-counter and prescription medicines only as told by your health care provider.  Keep all  follow-up visits as told by your health care provider. This is important. Contact a health care provider if:  Your symptoms do not go away.  You develop new symptoms. Get help right away if you:  Have a fever that does not go away with treatment.  Develop chills.  Have extreme weakness, fainting, or dehydration.  Have repeated vomiting.  Develop severe pain in your abdomen.  Pass bloody or tarry stool. Summary  Colitis is inflammation of the colon. Colitis may last a short time (be acute), or it may last a long time (become chronic).  Treatment for this condition depends on the cause and may include resting the bowel, taking medicines, or having surgery.  If you were prescribed an antibiotic medicine, take it as told by your health care provider. Do not stop taking the antibiotic even if you start to feel better.  Get help right away if you develop severe pain in your abdomen.  Keep all follow-up visits as told by your health care provider. This is important. This information is not intended to replace advice given to you by your health care provider. Make sure you discuss any questions you have with your health care provider. Document Released: 02/10/2004 Document Revised: 07/05/2017 Document Reviewed: 07/05/2017 Elsevier Interactive Patient Education  2019 [employer].

## 2018-06-19 NOTE — TOC Transition Note (Signed)
Transition of Care [facility]) - CM/SW Discharge Note   Patient Details  Name: Jane Brooks MRN: [id number] Date of Birth: 1970/12/13  Transition of Care [facility]) CM/SW Contact:  Pollie Friar, RN Phone Number: 06/19/2018, 2:18 PM   Clinical Narrative:    CM spoke to Dr Marlowe Aschoff office and they were in agreement with continuing [place] orders. Butch Penny with [facility] notified.  Pt has transportation arranged through [place].    Final next level of care: [employer] Barriers to Discharge: Barriers Resolved   Patient Goals and CMS Choice Patient states their goals for this hospitalization and ongoing recovery are:: return home CMS Medicare.gov Compare Post Acute Care list provided to:: Patient Choice offered to / list presented to : Patient  Discharge Placement                       Discharge Plan and Services In-house Referral: Clinical Social Work Discharge Planning Services: CM Consult Post Acute Care Choice: Resumption of Svcs/PTA Provider, Home Health                    HH Arranged: RN, PT [facility] Agency: [place] ([facility]) Date [place]: 06/19/18 Time [place]: 33 Representative spoke with at [place]: Janae Sauce  Social Determinants of Health (SDOH) Interventions     Readmission Risk Interventions Readmission Risk Prevention Plan 06/19/2018 05/16/2018  Transportation Screening - Complete  Medication Review Press photographer) - Complete  PCP or Specialist appointment within 3-5 days of discharge Complete Not Complete  PCP/Specialist Appt Not Complete comments - Pt discharging to [place] or Home Care Consult Complete Not Complete  HRI or Home Care Consult Pt Refusal Comments - Pt discharging to [place] Care/Counseling Consult Complete Not Complete  SW Consult Not Complete Comments - Pt discharging to CIR  Palliative Care Screening Not Applicable Not [place] Not Applicable Not Applicable

## 2018-06-19 NOTE — TOC Transition Note (Signed)
Transition of Care [facility]) - CM/SW Discharge Note   Patient Details  Name: Diondra Pines MRN: [id number] Date of Birth: [birth year]  Transition of Care [facility]) CM/SW Contact:  Ella Bodo, RN Phone Number: 06/19/2018, 1:56 PM   Clinical Narrative:  Pt medically stable for discharge home today.  Resumption of HH orders obtained from provider.  Notified [place] of resumption orders and dc orders today.       Final next level of care: [place] Barriers to Discharge: Barriers Resolved   Patient Goals and CMS Choice Patient states their goals for this hospitalization and ongoing recovery are:: return home CMS Medicare.gov Compare Post Acute Care list provided to:: Patient Choice offered to / list presented to : Patient                        Discharge Plan and Services In-house Referral: Clinical Social Work Discharge Planning Services: CM Consult Post Acute Care Choice: Resumption of Product/process development scientist, Home Health                    HH Arranged: RN, PT [facility] Agency: [place] ([facility]) Date [place]: 06/19/18 Time [place]: 1355 Representative spoke with at [place]: Janae Sauce    Readmission Risk Interventions Readmission Risk Prevention Plan 06/19/2018 05/16/2018  Transportation Screening - Complete  Medication Review Press photographer) - Complete  PCP or Specialist appointment within 3-5 days of discharge Complete Not Complete  PCP/Specialist Appt Not Complete comments - Pt discharging to [place] or Home Care Consult Complete Not Complete  HRI or Home Care Consult Pt Refusal Comments - Pt discharging to [place] Care/Counseling Consult Complete Not Complete  SW Consult Not Complete Comments - Pt discharging to [place] Screening Not Applicable Not [place] Not Applicable Not Applicable   Reinaldo Raddle, RN, BSN  Trauma/Neuro ICU Case  Manager [phone]

## 2018-06-19 NOTE — TOC Progression Note (Signed)
Transition of Care [facility]) - Progression Note    Patient Details  Name: Wendee Hata MRN: [id number] Date of Birth: [birth year]  Transition of Care [facility]) CM/SW [place], [place] Phone Number: 06/19/2018, 12:40 PM  Clinical Narrative:    CSW consulted for transportation needs. CSW spoke with patient and she confirmed she needed transportation home. CSW gave RN taxi voucher for patient.  Thurmond Butts, MSW, [facility] Clinical Social Worker [phone]    Expected Discharge Plan: [place] Barriers to Discharge: Continued Medical Work up  Expected Discharge Plan and Services Expected Discharge Plan: [place] In-house Referral: Clinical Social Work Discharge Planning Services: CM Consult Post Acute Care Choice: Resumption of Svcs/PTA Provider, Home Health Living arrangements for the past 2 months: Apartment Expected Discharge Date: 06/19/18                           [facility] Agency: [place] ([facility])         Social Determinants of Health (SDOH) Interventions    Readmission Risk Interventions Readmission Risk Prevention Plan 06/19/2018 05/16/2018  Transportation Screening - Complete  Medication Review Press photographer) - Complete  PCP or Specialist appointment within 3-5 days of discharge Complete Not Complete  PCP/Specialist Appt Not Complete comments - Pt discharging to [place] or Home Care Consult Complete Not Complete  HRI or Home Care Consult Pt Refusal Comments - Pt discharging to [place] Care/Counseling Consult Complete Not Complete  SW Consult Not Complete Comments - Pt discharging to [place] Screening Not Applicable Not [place] Not Applicable Not Applicable

## 2018-06-19 NOTE — Discharge Summary (Signed)
Physician Discharge Summary  Patient ID: Jane Brooks MRN: [id number] DOB/AGE: 50-Jun-1972 50 y.o.  Admit date: 06/15/2018 Discharge date: 06/19/2018  Admission Diagnoses: Nausea/vomiting Colitis Abdominal pain Pancreatic cancer Severe protein calorie malnutrition  Discharge Diagnoses:  Active Problems:   Colitis and same as above.  Discharged Condition: stable  Hospital Course:  Pt was admitted to the floor and placed on IV antibiotics with clear liquid diet.  Her nausea slowly improved.  Her pain slowly improved as well.  She had upper GI to address n/v and had a bit delayed stomach emptying.  She was started on reglan.  She improved  Her diet.  She was ambulatory.  She was discharged to home in improved condition on oral antibiotics.    Consults: nutrition  Significant Diagnostic Studies: labs: prealbumin 9.1  Treatments: hydration, antibiotics, and reglan  Discharge Exam: Blood pressure (!) 141/87, pulse 72, temperature 98.1 F (36.7 C), temperature source Oral, resp. rate 20, height 4' 11"  (1.499 m), weight 53.5 kg, SpO2 99 %. General appearance: alert, cooperative and no distress Resp: breathing comfortably GI: soft, non distended, mildly tender epigastrium and RUQ  Disposition: Discharge disposition: 01-Home or Self Care       Discharge Instructions    Call MD for:  difficulty breathing, headache or visual disturbances   Complete by:  As directed    Call MD for:  persistant nausea and vomiting   Complete by:  As directed    Call MD for:  redness, tenderness, or signs of infection (pain, swelling, redness, odor or green/yellow discharge around incision site)   Complete by:  As directed    Call MD for:  severe uncontrolled pain   Complete by:  As directed    Call MD for:  temperature >100.4   Complete by:  As directed    Diet - low sodium heart healthy   Complete by:  As directed    Increase activity slowly   Complete by:  As directed      Allergies as  of 06/19/2018      Reactions   Latex Hives      Medication List    STOP taking these medications   lipase/protease/amylase 36000 UNITS Cpep capsule Commonly known as:  CREON   methocarbamol 500 MG tablet Commonly known as:  ROBAXIN     TAKE these medications   acetaminophen 325 MG tablet Commonly known as:  TYLENOL Take 1-2 tablets (325-650 mg total) by mouth every 4 (four) hours as needed for mild pain.   amoxicillin-clavulanate 875-125 MG tablet Commonly known as:  AUGMENTIN Take 1 tablet by mouth every 12 (twelve) hours.   bethanechol 10 MG tablet Commonly known as:  URECHOLINE Take 1 tablet (10 mg total) by mouth 2 (two) times a day.   cyclobenzaprine 5 MG tablet Commonly known as:  FLEXERIL Take 1 tablet (5 mg total) by mouth 3 (three) times daily as needed for muscle spasms.   free water Soln Place 100 mLs into feeding tube 2 (two) times daily. Use filtered water   metoCLOPramide 5 MG tablet Commonly known as:  REGLAN Take 1 tablet (5 mg total) by mouth every 6 (six) hours as needed for nausea or vomiting.   morphine 15 MG tablet Commonly known as:  MSIR Take 1 tablet (15 mg total) by mouth every 4 (four) hours as needed for severe pain.   ondansetron 8 MG tablet Commonly known as:  ZOFRAN Take 1 tablet (8 mg total) by mouth every 8 (eight)  hours as needed for nausea or vomiting.   pantoprazole 40 MG tablet Commonly known as:  PROTONIX Take 1 tablet (40 mg total) by mouth daily.   potassium chloride SA 20 MEQ tablet Commonly known as:  K-DUR Take 1 tablet (20 mEq total) by mouth daily.   promethazine 25 MG tablet Commonly known as:  PHENERGAN Take 1 tablet (25 mg total) by mouth every 6 (six) hours as needed for nausea or vomiting.      Follow-up [place] Follow up on 06/25/2018.   Why:  Hospital follow-up appointment at 10:30am. Office will call the day before apointment to notify of method of  appointment Contact information: [place] [address] [phone]          Signed: Stark Klein 06/19/2018, 12:23 PM

## 2018-06-25 ENCOUNTER — Ambulatory Visit: Payer: Medicaid Other | Attending: Primary Care | Admitting: Primary Care

## 2018-06-25 ENCOUNTER — Emergency Department (HOSPITAL_COMMUNITY)
Admission: EM | Admit: 2018-06-25 | Discharge: 2018-06-25 | Disposition: A | Discharge status: Home / Self Care | Payer: Medicaid Other | Attending: Emergency Medicine | Admitting: Emergency Medicine

## 2018-06-25 ENCOUNTER — Other Ambulatory Visit: Payer: Self-pay

## 2018-06-25 ENCOUNTER — Encounter: Payer: Self-pay | Admitting: Primary Care

## 2018-06-25 ENCOUNTER — Emergency Department (HOSPITAL_COMMUNITY): Payer: Medicaid Other

## 2018-06-25 DIAGNOSIS — R109 Unspecified abdominal pain: Secondary | ICD-10-CM | POA: Diagnosis present

## 2018-06-25 DIAGNOSIS — R7303 Prediabetes: Secondary | ICD-10-CM | POA: Diagnosis not present

## 2018-06-25 DIAGNOSIS — C259 Malignant neoplasm of pancreas, unspecified: Secondary | ICD-10-CM | POA: Diagnosis not present

## 2018-06-25 DIAGNOSIS — Z87891 Personal history of nicotine dependence: Secondary | ICD-10-CM | POA: Insufficient documentation

## 2018-06-25 DIAGNOSIS — R112 Nausea with vomiting, unspecified: Secondary | ICD-10-CM | POA: Insufficient documentation

## 2018-06-25 DIAGNOSIS — C257 Malignant neoplasm of other parts of pancreas: Secondary | ICD-10-CM

## 2018-06-25 DIAGNOSIS — Z79899 Other long term (current) drug therapy: Secondary | ICD-10-CM | POA: Insufficient documentation

## 2018-06-25 DIAGNOSIS — Z09 Encounter for follow-up examination after completed treatment for conditions other than malignant neoplasm: Secondary | ICD-10-CM

## 2018-06-25 DIAGNOSIS — E440 Moderate protein-calorie malnutrition: Secondary | ICD-10-CM | POA: Insufficient documentation

## 2018-06-25 DIAGNOSIS — I10 Essential (primary) hypertension: Secondary | ICD-10-CM | POA: Diagnosis not present

## 2018-06-25 DIAGNOSIS — R1013 Epigastric pain: Secondary | ICD-10-CM | POA: Diagnosis not present

## 2018-06-25 LAB — CBC
HCT: 38.4 % (ref 36.0–46.0)
Hemoglobin: 12.6 g/dL (ref 12.0–15.0)
MCH: 23.1 pg — ABNORMAL LOW (ref 26.0–34.0)
MCHC: 32.8 g/dL (ref 30.0–36.0)
MCV: 70.5 fL — ABNORMAL LOW (ref 80.0–100.0)
Platelets: 283 10*3/uL (ref 150–400)
RBC: 5.45 MIL/uL — ABNORMAL HIGH (ref 3.87–5.11)
RDW: 21.8 % — ABNORMAL HIGH (ref 11.5–15.5)
WBC: 8.4 10*3/uL (ref 4.0–10.5)
nRBC: 0 % (ref 0.0–0.2)

## 2018-06-25 LAB — COMPREHENSIVE METABOLIC PANEL
ALT: 38 U/L (ref 0–44)
AST: 52 U/L — ABNORMAL HIGH (ref 15–41)
Albumin: 3.7 g/dL (ref 3.5–5.0)
Alkaline Phosphatase: 86 U/L (ref 38–126)
Anion gap: 12 (ref 5–15)
BUN: 8 mg/dL (ref 6–20)
CO2: 21 mmol/L — ABNORMAL LOW (ref 22–32)
Calcium: 9.3 mg/dL (ref 8.9–10.3)
Chloride: 101 mmol/L (ref 98–111)
Creatinine, Ser: 0.56 mg/dL (ref 0.44–1.00)
GFR calc Af Amer: >60 mL/min (ref >60)
GFR calc non Af Amer: >60 mL/min (ref >60)
Glucose, Bld: 100 mg/dL — ABNORMAL HIGH (ref 70–99)
Potassium: 3.7 mmol/L (ref 3.5–5.1)
Sodium: 134 mmol/L — ABNORMAL LOW (ref 135–145)
Total Bilirubin: 0.9 mg/dL (ref 0.3–1.2)
Total Protein: 7.4 g/dL (ref 6.5–8.1)

## 2018-06-25 LAB — LIPASE, BLOOD: Lipase: 18 U/L (ref 11–51)

## 2018-06-25 LAB — I-STAT BETA HCG BLOOD, ED (MC, WL, AP ONLY): I-stat hCG, quantitative: <5 m[IU]/mL (ref <5)

## 2018-06-25 MED ORDER — SODIUM CHLORIDE 0.9 % IV BOLUS
1000.0000 mL | Freq: Once | INTRAVENOUS | Status: AC
  Administered 2018-06-25: 1000 mL via INTRAVENOUS

## 2018-06-25 MED ORDER — MORPHINE SULFATE (PF) 4 MG/ML IV SOLN
4.0000 mg | Freq: Once | INTRAVENOUS | Status: AC
  Administered 2018-06-25: 4 mg via INTRAVENOUS
  Filled 2018-06-25: qty 1

## 2018-06-25 MED ORDER — HEPARIN SOD (PORK) LOCK FLUSH 100 UNIT/ML IV SOLN
500.0000 [IU] | Freq: Once | INTRAVENOUS | Status: AC
  Administered 2018-06-25: 500 [IU]
  Filled 2018-06-25: qty 5

## 2018-06-25 MED ORDER — PROMETHAZINE HCL 25 MG/ML IJ SOLN
25.0000 mg | Freq: Once | INTRAMUSCULAR | Status: AC
  Administered 2018-06-25: 25 mg via INTRAVENOUS
  Filled 2018-06-25: qty 1

## 2018-06-25 NOTE — ED Notes (Signed)
Patient transported to x-ray. ?

## 2018-06-25 NOTE — Discharge Instructions (Signed)
Labs appear stable today [name] does not show any acute abnormality Please follow-up as discussed with your surgeon and return if you are having worsening symptoms

## 2018-06-25 NOTE — ED Provider Notes (Signed)
[facility] EMERGENCY DEPARTMENT Provider Note   CSN: [id number] Arrival date & time: 06/25/18  1454    History   Chief Complaint Chief Complaint  Patient presents with  . Abdominal Pain    HPI Sharnay Cashion is a 48 y.o. female.     HPI  48 year old female history of pancreatic cancer, status post Whipple procedure 04/25/2018 with recent discharge 6 days ago for colitis presents today stating that she ran out of her pain medicine on Monday.  She awoke this morning and had severe upper abdominal pain with vomiting.  That she vomited large amounts of greenish material.  She has continued to vomit through the day.  She continues to have upper abdominal pain. Colitis Pancreatic cancer, ypT2N1, s/p whipple with portal vein reconstruction 04/25/18 N/V--most likely related to colitis Anemia of chronic disease Moderate protein calorie malnutrition--albumin 2.9.  Prealbumin 9.1  Past Medical History:  Diagnosis Date  . Family history of lung cancer   . Family history of prostate cancer   . Family history of uterine cancer   . Gallstones 10/2017  . Pancreatic cancer ([place])   . Pneumothorax, closed, traumatic    years ago  . PONV (postoperative nausea and vomiting)     Patient Active Problem List   Diagnosis Date Noted  . Colitis 06/15/2018  . Hypokalemia 05/22/2018  . Peripheral edema   . Prediabetes   . Acute blood loss anemia   . Thrombocytopenia ([place])   . Urinary retention   . Sinus tachycardia   . Debility 05/16/2018  . Acute respiratory failure with hypoxemia ([place]) 04/30/2018  . HCAP (healthcare-associated pneumonia) 04/30/2018  . Essential hypertension 04/30/2018  . Chronic prescription opiate use 04/30/2018  . Postoperative pain 04/30/2018  . Tachycardia 04/30/2018  . Tachypnea 04/30/2018  . Acute respiratory alkalosis 04/30/2018  . Adenocarcinoma of head of pancreas ([place]) 04/25/2018  . Genetic testing 01/24/2018  . Family history of uterine  cancer   . Family history of prostate cancer   . Family history of lung cancer   . Pancreatic cancer ([place]) 11/09/2017  . Pancreatic mass   . Cholecystitis 10/30/2017  . Jaundice 10/30/2017    Past Surgical History:  Procedure Laterality Date  . BILIARY STENT PLACEMENT  10/31/2017   Procedure: BILIARY STENT PLACEMENT;  Surgeon: [name], MD;  Location: [facility] ENDOSCOPY;  Service: Gastroenterology;;  . ERCP N/A 10/31/2017   Procedure: ENDOSCOPIC RETROGRADE CHOLANGIOPANCREATOGRAPHY (ERCP);  Surgeon: [name], MD;  Location: [facility] ENDOSCOPY;  Service: Gastroenterology;  Laterality: N/A;  . ESOPHAGOGASTRODUODENOSCOPY N/A 11/08/2017   Procedure: ESOPHAGOGASTRODUODENOSCOPY (EGD);  Surgeon: Milus Banister, MD;  Location: [facility] ENDOSCOPY;  Service: Endoscopy;  Laterality: N/A;  . EUS N/A 11/08/2017   Procedure: UPPER ENDOSCOPIC ULTRASOUND (EUS) RADIAL;  Surgeon: Milus Banister, MD;  Location: [facility] ENDOSCOPY;  Service: Endoscopy;  Laterality: N/A;  . FINE NEEDLE ASPIRATION  11/08/2017   Procedure: FINE NEEDLE ASPIRATION;  Surgeon: Milus Banister, MD;  Location: [facility] ENDOSCOPY;  Service: Endoscopy;;  . IR IMAGING GUIDED PORT INSERTION  11/15/2017  . LAPAROSCOPY N/A 04/25/2018   Procedure: LAPAROSCOPY DIAGNOSTIC;  Surgeon: Stark Klein, MD;  Location: [facility];  Service: General;  Laterality: N/A;  GENERAL AND EPIDURAL ANESTHESIA  . SPHINCTEROTOMY  10/31/2017   Procedure: SPHINCTEROTOMY;  Surgeon: [name], MD;  Location: [facility] ENDOSCOPY;  Service: Gastroenterology;;  . TONSILLECTOMY    . WHIPPLE PROCEDURE N/A 04/25/2018   Procedure: WHIPPLE PROCEDURE, RECONSTRUCTION OF PORTAL VEIN;  Surgeon: Stark Klein, MD;  Location: [facility] OR;  Service: General;  Laterality: N/A;     OB History   No obstetric history on file.      Home Medications    Prior to Admission medications   Medication Sig Start Date End Date Taking? Authorizing Provider  acetaminophen (TYLENOL) 325 MG tablet Take 1-2 tablets  (325-650 mg total) by mouth every 4 (four) hours as needed for mild pain. 05/21/18   Love, Ivan Anchors, PA-C  amoxicillin-clavulanate (AUGMENTIN) 875-125 MG tablet Take 1 tablet by mouth every 12 (twelve) hours. 06/19/18   Stark Klein, MD  cyclobenzaprine (FLEXERIL) 5 MG tablet Take 1 tablet (5 mg total) by mouth 3 (three) times daily as needed for muscle spasms. 05/24/18   Meredith Staggers, MD  metoCLOPramide (REGLAN) 5 MG tablet Take 1 tablet (5 mg total) by mouth every 6 (six) hours as needed for nausea or vomiting. 06/19/18   Stark Klein, MD  morphine (MSIR) 15 MG tablet Take 1 tablet (15 mg total) by mouth every 4 (four) hours as needed for severe pain. Patient not taking: Reported on 06/25/2018 05/21/18   Love, Ivan Anchors, PA-C  ondansetron (ZOFRAN) 8 MG tablet Take 1 tablet (8 mg total) by mouth every 8 (eight) hours as needed for nausea or vomiting. 05/21/18   Love, Ivan Anchors, PA-C  pantoprazole (PROTONIX) 40 MG tablet Take 1 tablet (40 mg total) by mouth daily. 06/19/18   Stark Klein, MD  potassium chloride SA (K-DUR) 20 MEQ tablet Take 1 tablet (20 mEq total) by mouth daily. 05/21/18   Love, Ivan Anchors, PA-C  promethazine (PHENERGAN) 25 MG tablet Take 1 tablet (25 mg total) by mouth every 6 (six) hours as needed for nausea or vomiting. 06/19/18   Stark Klein, MD    Family History Family History  Problem Relation Age of Onset  . Diabetes Mother   . Hypertension Mother   . COPD Mother   . Uterine cancer Mother 58       had hysterectomy  . Diabetes Sister   . Hypertension Sister   . Lung cancer Maternal Grandmother        lung cancer  . Hypertension Sister     Social History Social History   Tobacco Use  . Smoking status: Former Smoker    Packs/day: 1.00    Years: 37.00    Pack years: 37.00    Types: Cigarettes    Last attempt to quit: 04/25/2018    Years since quitting: 0.1  . Smokeless tobacco: Never Used  Substance Use Topics  . Alcohol use: Not Currently  . Drug use: Never      Allergies   Latex   Review of Systems Review of Systems  All other systems reviewed and are negative.    Physical Exam Updated Vital Signs BP (!) 142/98 (BP Location: Right Arm)   Pulse 95   Temp 98.5 F (36.9 C) (Oral)   Resp 16   SpO2 100%   Physical Exam Vitals signs and nursing note reviewed.  Constitutional:      General: She is in acute distress.     Appearance: She is well-developed. She is ill-appearing.  HENT:     Head: Normocephalic.     Mouth/Throat:     Mouth: Mucous membranes are moist.  Eyes:     Extraocular Movements: Extraocular movements intact.  Cardiovascular:     Rate and Rhythm: Normal rate and regular rhythm.  Pulmonary:     Effort: Pulmonary effort is normal.  Breath sounds: Normal breath sounds.  Abdominal:     General: Abdomen is flat. Bowel sounds are increased.     Comments: Diffuse upper abdominal tenderness to palpation Increased bowel sounds No rigidity or rebound  Skin:    General: Skin is warm and dry.     Capillary Refill: Capillary refill takes less than 2 seconds.  Neurological:     General: No focal deficit present.     Mental Status: She is alert.  Psychiatric:        Mood and Affect: Mood normal.      ED Treatments / Results  Labs (all labs ordered are listed, but only abnormal results are displayed) Labs Reviewed  CBC - Abnormal; Notable for the following components:      Result Value   RBC 5.45 (*)    MCV 70.5 (*)    MCH 23.1 (*)    RDW 21.8 (*)    All other components within normal limits  LIPASE, BLOOD  COMPREHENSIVE METABOLIC PANEL  URINALYSIS, ROUTINE W REFLEX MICROSCOPIC  I-STAT BETA HCG BLOOD, ED ([facility], [facility], [facility] ONLY)    EKG None  Radiology Dg Abdomen 1 View  Result Date: 06/25/2018 CLINICAL DATA:  Abdominal pain status post Whipple procedure. EXAM: ABDOMEN - 1 VIEW COMPARISON:  Radiograph of May 15, 2018. FINDINGS: The bowel gas pattern is normal. Surgical clips are noted in the epigastric  region. No radio-opaque calculi or other significant radiographic abnormality are seen. IMPRESSION: No evidence of bowel obstruction or ileus. Electronically Signed   By: Marijo Conception M.D.   On: 06/25/2018 17:11    Procedures Procedures (including critical care time)  Medications Ordered in ED Medications - No data to display   Initial Impression / Assessment and Plan / ED Course  I have reviewed the triage vital signs and the nursing notes.  Pertinent labs & imaging results that were available during my care of the patient were reviewed by me and considered in my medical decision making (see chart for details).       48 year old female status post Whipple procedure who is having recurrent pain nausea and vomiting.  She has been out of her pain medications.  Work-up here shows no significant abnormalities.  Patient feels improved after IV fluids, pain medicine, and anti-medics.  She is no longer vomiting.  Patient care discussed with general surgery.  Consult appreciated.  They had already called in her medications.  Patient states that she feels that she can go home and will go get her medicines.  We have discussed return precautions and she has had follow-up information given  Final Clinical Impressions(s) / ED Diagnoses   Final diagnoses:  Epigastric pain  Nausea and vomiting, intractability of vomiting not specified, unspecified vomiting type    ED Discharge Orders    None       Pattricia Boss, MD 06/25/18 1726

## 2018-06-25 NOTE — Progress Notes (Signed)
[facility]/Trauma Progress Note       Subjective: Pt is a 48 yo female s/p whipple 04/25/2018 by Dr. Barry Dienes with recent discharge 6 days ago for colitis who presented to the ED with complaints of abdominal pain N & V. Pt states this pain is the same pain she had since her whipple just worse today cause she is out of her pain meds. She called our office for a refill but couldn't wait til Dr. Barry Dienes was out of the OR. She states she was able to tolerate some food today. She had a yogurt and a banana. Her stools have been normal. She had some yellow emesis today. She wants to go home. I informed her that Dr. Barry Dienes has refilled her pain medicine.     LOS: 0 days   Assessment/Plan  Abdominal pain:   If pt is able to tolerate PO and her pain is under control then she could be discharged from the ER. Dr. Barry Dienes refilled her pain medicine today. Follow up with Dr. Barry Dienes.  Objective: Vital signs in last 24 hours: Temp:  [98.5 F (36.9 C)] 98.5 F (36.9 C) (06/09 1507) Pulse Rate:  [95-109] 95 (06/09 1541) Resp:  [16-18] 16 (06/09 1541) BP: (137-142)/(96-98) 142/98 (06/09 1541) SpO2:  [100 %] 100 % (06/09 1541)    Intake/Output from previous day: No intake/output data recorded. Intake/Output this shift: No intake/output data recorded.  PE: Gen:  Alert, NAD, pleasant, cooperative Card:  RRR, no M/G/R heard Pulm:  Rate and effort normal Abd: Soft, ND, +BS, incision is well healed, generalized TTP with mild guarding. No peritonitis  Skin: warm and dry   Anti-infectives: Anti-infectives (From admission, onward)   None      Lab Results:  Recent Labs    06/25/18 1509  WBC 8.4  HGB 12.6  HCT 38.4  PLT 283   BMET Recent Labs    06/25/18 1509  NA 134*  K 3.7  CL 101  CO2 21*  GLUCOSE 100*  BUN 8  CREATININE 0.56  CALCIUM 9.3   PT/INR No results for input(s): LABPROT, INR in the last 72 hours. CMP     Component Value Date/Time   NA 134 (L)  06/25/2018 1509   K 3.7 06/25/2018 1509   CL 101 06/25/2018 1509   CO2 21 (L) 06/25/2018 1509   GLUCOSE 100 (H) 06/25/2018 1509   BUN 8 06/25/2018 1509   CREATININE 0.56 06/25/2018 1509   CREATININE 0.65 04/04/2018 0900   CALCIUM 9.3 06/25/2018 1509   PROT 7.4 06/25/2018 1509   ALBUMIN 3.7 06/25/2018 1509   AST 52 (H) 06/25/2018 1509   AST 32 04/04/2018 0900   ALT 38 06/25/2018 1509   ALT 82 (H) 04/04/2018 0900   ALKPHOS 86 06/25/2018 1509   BILITOT 0.9 06/25/2018 1509   BILITOT 0.5 04/04/2018 0900   GFRNONAA >60 06/25/2018 1509   GFRNONAA >60 04/04/2018 0900   GFRAA >60 06/25/2018 1509   GFRAA >60 04/04/2018 0900   Lipase     Component Value Date/Time   LIPASE 18 06/25/2018 1509    Studies/Results: No results found.    Kalman Drape , [facility] Surgery 06/25/2018, 4:38 PM  Pager: [phone] Mon-Wed, Friday 7:00am-4:30pm Thurs 7am-11:30am  Consults: [phone]

## 2018-06-25 NOTE — ED Notes (Signed)
Pt standing outside the door stating she has to go because she is too cold to wait, this Rn informed the pt that we are waiting for a heparin flush before I can discharge the pt. Pt continues to stand outside room

## 2018-06-25 NOTE — ED Triage Notes (Signed)
Pt endorses mid abd pain x 2 days, has been out of her morphine for same time and has been after her provider to get prescription refilled but has been unsuccessful. Pt has hx of pancreatic cancer.

## 2018-06-25 NOTE — Progress Notes (Signed)
Virtual Visit via Telephone Note  I connected with Marcelo Baldy on 06/25/18 at 10:30 AM EDT by telephone and verified that I am speaking with the correct person using two identifiers.   I discussed the limitations, risks, security and privacy concerns of performing an evaluation and management service by telephone and the availability of in person appointments. I also discussed with the patient that there may be a patient responsible charge related to this service. The patient expressed understanding and agreed to proceed.   History of Present Illness: Establish care and hospital discharge.  Ms Vaquera after a post Whipple with portal vein reconstruction. Approx 6-8 weeks later she started having significant blood loss and abdominal pain. PMH: Pancreatic cancer followed by oncology and colitis.  Observations/Objective: Review of Systems  Constitutional: Positive for weight loss.  HENT: Negative.   Eyes: Negative.   Respiratory: Negative.   Cardiovascular: Negative.   Gastrointestinal: Positive for abdominal pain, nausea and vomiting.  Musculoskeletal: Negative.   Skin: Negative.   Neurological: Positive for weakness.  Endo/Heme/Allergies: Negative.   Psychiatric/Behavioral: Negative.     Assessment and Plan: There are no diagnoses linked to this encounter.  Follow Up Instructions: Soley was seen today for hospitalization follow-up.  Diagnoses and all orders for this visit:  Essential hypertension Remains elevated viewing Bp in encounters this can be contributed to pain oe re -eval medication as a secondary cause  Hospital discharge follow-up She is still experiencing n/v advised to try to stay hydrated with water and gateraid also a brats diet if unable to eat or drinks she will need to rtn to ED  Malignant neoplasm of other parts of pancreas ([place]) Followed and tx by oncology reviewing records she is undergoing tx.  Prediabetes Will re-eval with in person visit and  repeat labs.   Nausea and vomiting, intractability of vomiting not specified, unspecified vomiting type She has several medications for n/v listed used prn if n/v cont proceeds to ED for eval     I discussed the assessment and treatment plan with the patient. The patient was provided an opportunity to ask questions and all were answered. The patient agreed with the plan and demonstrated an understanding of the instructions.   The patient was advised to call back or seek an in-person evaluation if the symptoms worsen or if the condition fails to improve as anticipated.  I provided  30  minutes of non-face-to-face time during this encounter.   Kerin Perna, NP

## 2018-06-25 NOTE — Progress Notes (Signed)
Pt was diagnosed with pancreatic cancer in 2019

## 2018-06-29 ENCOUNTER — Encounter: Payer: Self-pay | Admitting: Primary Care

## 2018-06-29 DIAGNOSIS — R112 Nausea with vomiting, unspecified: Secondary | ICD-10-CM | POA: Insufficient documentation

## 2018-06-29 DIAGNOSIS — Z09 Encounter for follow-up examination after completed treatment for conditions other than malignant neoplasm: Secondary | ICD-10-CM | POA: Insufficient documentation

## 2018-07-02 ENCOUNTER — Telehealth: Payer: Self-pay | Admitting: Hematology

## 2018-07-02 NOTE — Telephone Encounter (Signed)
I talk with patient regarding schedule  

## 2018-07-05 ENCOUNTER — Telehealth: Payer: Self-pay

## 2018-07-05 NOTE — Telephone Encounter (Signed)
Called and left voicemail regarding pre-screening questions for appt on 6/22

## 2018-07-05 NOTE — Progress Notes (Signed)
[facility]   [phone] [phone]   Clinic Follow up Note   Patient Care Team: Patient, No Pcp Per as PCP - General (General Practice) Milus Banister, MD as Attending Physician (Gastroenterology) Truitt Merle, MD as Consulting Physician (Hematology)  Date of Service:  07/08/2018  CHIEF COMPLAINT: F/u on pancreatic cancer  SUMMARY OF ONCOLOGIC HISTORY: Oncology History Overview Note  Cancer Staging Pancreatic cancer [facility]) Staging form: Exocrine Pancreas, AJCC 8th Edition - Clinical stage from 11/08/2017: Stage IB (cT2, cN0, cM0) - Signed by Truitt Merle, MD on 11/09/2017     Pancreatic cancer ([place])  10/30/2017 Imaging   MRI Abdomen 10/30/17  IMPRESSION: 1. Marked intra and extrahepatic biliary duct dilatation with abrupt cut off of the common bile duct at the pancreatic head. This is associated with marked dilatation of the main pancreatic duct also with an abrupt cut off at the level of the pancreatic head. Pancreatic parenchyma in the body and tail of pancreas is markedly atrophic. A subtle 2.5 x 3 cm lesion with differential signal intensity is identified in the head of the pancreas which appears slightly hypoenhancing on postcontrast imaging. Together, these features are highly concerning for pancreatic adenocarcinoma. EUS is recommended to further evaluate. 2. Portal vein is patent although there is some mass-effect on the portal splenic confluence and IVC. Fat planes around celiac axis and SMA appear preserved. 3. Upper normal to borderline portal caval lymph node associated with small right para-aortic lymphadenopathy.     10/30/2017 Tumor Marker   Baseline Ca 19-9 at 140   10/31/2017 Procedure   ERCP by Dr. Lyndel Safe 10/31/17  IMPRESSION Malignant appearing CBD stricture (due to pancreatic mass) status post sphincterotomy and stent insertion.   11/08/2017 Cancer Staging   Staging form: Exocrine Pancreas, AJCC 8th Edition -  Clinical stage from 11/08/2017: Stage IB (cT2, cN0, cM0) - Signed by Truitt Merle, MD on 11/09/2017   11/08/2017 Procedure   EUS and EGD by Dr. Ardis Hughs 11/08/17  IMPRESSION -Very vague, irregularly bordered, approximately 2.6cm mass was found in the head of pancreas. This causes biliary and pancreatic duct obstruction. The previously plastic biliary stent is in good position. The mass directly abuts and attenuates the PV/SMV confluence, suggesting invasion. FNA performed and the preliminary cytology review is positive for maligancy (adenocarcinoma). - [place] [facility] office will arrange referrals to medical and surgical oncology. - Case to be discussed at upcoming multidisciplinary GI tumor conference.   11/08/2017 Initial Biopsy   Biopsy Cytology 11/08/17 Diagnosis FINE NEEDLE ASPIRATION, ENDOSCOPIC, PANCREAS HEAD (SPECIMEN 1 OF 1 COLLECTED 11/08/17): MALIGNANT CELLS CONSISTENT WITH ADENOCARCINOMA.   11/09/2017 Initial Diagnosis   Pancreatic cancer ([place])   11/15/2017 Imaging   CT Chest and Pelvis WO Contrast 11/15/17 IMPRESSION: 1. No findings of active malignancy in the chest or pelvis. 2. Aortic Atherosclerosis (ICD10-I70.0). Coronary atherosclerosis. 3. Faint centrilobular nodularity in the lung apices, raising the possibility of hypersensitivity pneumonitis or less likely respiratory bronchiolitis. Airway thickening is present, suggesting bronchitis or reactive airways disease.   11/19/2017 -  Chemotherapy   FOLFIRINOX q2weeks with Neulasta injection for 4 months starting 11/19/17. Completed 8 cycles on 03/07/18    02/04/2018 Imaging   CT AP W Contrast 02/04/18  IMPRESSION: 1. No substantial change in size of mass involving head of pancreas. A aortocaval lymph node which appears increased in the interval measuring 1.1 cm versus 0.8 cm previously. Stable 1.2 cm peripancreatic node. 2. Dilated proximal common bile duct is mildly increased in caliber  compared with 12/12/2017. The  common bile duct stent remains in place in appears to be in appropriate position.    04/19/2018 Imaging   CT AP 04/19/18  IMPRESSION: 1. Essentially stable exam compared to most recent CT scans of 02/04/2018 and 12/12/2017. 2. Stable mass in the head of the pancreas with duct dilatation and atrophy upstream. Mass continues to surround the GDA and contacts the portal vein but is free of the celiac trunk and SMA. Mass probably contacts the RIGHT hepatic artery at the takeoff of the GDA (image 56/5). Dominant LEFT hepatic artery originates from the LEFT gastric. 3. Mild intrahepatic and extrahepatic duct dilatation similar to prior with flexible stent in the common bile duct. 4. No new or progressive disease. 5. No peritoneal metastasis.   04/25/2018 Imaging   CT Angio chest 04/25/18  IMPRESSION: 1. No demonstrable pulmonary embolus. No thoracic aortic aneurysm or dissection. 2. Areas of atelectatic change bilaterally. No consolidation. No pneumothorax. Endotracheal tube tip is slightly superior to the carina. 3. Enlarged subcarinal lymph node, concerning for neoplastic etiology. 4. 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 Biliary stent present. Biliary duct air is felt to be secondary to the stent placement. 5. Mild upper abdominal ascites. Pneumoperitoneum with apparent drainage catheter present. The pneumoperitoneum is likely of post procedural/postoperative etiology. Bowel perforation in this circumstance cannot be excluded entirely, however. Close clinical assessment in this regard advised. 6.  Left ventricular hypertrophy.   04/25/2018 Surgery   LAPAROSCOPY DIAGNOSTIC and WHIPPLE PROCEDURE, RECONSTRUCTION OF PORTAL VEIN by Dr. Barry Dienes, Dr. Marcello Moores 04/25/18    04/25/2018 Pathology Results    Diagnosis 04/25/18 1. Whipple procedure/resection, with gallbladder with segment of portal vein - INVASIVE ADENOCARCINOMA, MODERATELY DIFFERENTIATED, SPANNING 3.5 CM. -  ADENOCARCINOMA IS PRESENT AT THE SUPERIOR MARGIN OF THE ATTACHED VEIN AND THE INFERIOR MARGIN OF THE ATTACHED VEIN (BLOCKS E AND F). - ADENOCARCINOMA EXTENDS INTO PERIPANCREATIC SOFT TISSUE. - PERINEURAL INVASION IS IDENTIFIED, DIFFUSE. - METASTATIC ADENOCARCINOMA IN 1 OF 24 LYMPH NODES (1/24). - SEE ONCOLOGY TABLE BELOW. 2. Lymph node, biopsy, portal - THERE IS NO EVIDENCE OF CARCINOMA IN 5 OF 5 LYMPH NODES (0/5). 3. Pancreas, biopsy, additional pancreatic margin - HIGH GRADE GLANDULAR DYSPLASIA, FOCAL    05/09/2018 Imaging   CT AP W Contrast 05/09/18 IMPRESSION: Status post Whipple's procedure. 1 surgical drain remains in the area. Pancreatic duct stent is noted.   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 It appears to be stable in size compared to prior exam.   Wall and fold thickening is seen involving right and transverse colon which may represent edema or possibly inflammation.   Aortic Atherosclerosis (ICD10-I70.0).   06/15/2018 Imaging   CT AP W Contrast 06/15/18 IMPRESSION: Changes of prior Whipple procedure. Previously seen pancreatic duct stent presumably has migrated across the choledocho-jejunostomy and is now located in the biliary ducts. No biliary or pancreatic ductal dilatation.   Severe diffuse low-density throughout the liver, likely fatty infiltration.   Wall thickening within the ascending colon and hepatic flexure concerning for colitis.   Small amount of free fluid in the pelvis.   Aortic atherosclerosis.      CURRENT THERAPY:  S/p whipple surgery with portal vein reconstruction  INTERVAL HISTORY:  Alysse Rathe is here for a follow up post surgery and hospitalizations. She presents to the clinic alone. She is not doing well, and  has eaten very little the past week. She reports she could not hold anything down due to nausea, vomiting and  diarrhea. She was seen in the ED and stabilized, but symptoms returned the following day. Nausea is relieved with Zofran. She denies any history of Crohn's disease. She has diarrhea daily, twice this morning. Her weight is down 9lbs in the past month. She reports regular urination. She denies any edema. She denies any cough or shortness of breath. She denies any mouth sores. She currently takes potassium supplement once daily.   REVIEW OF SYSTEMS:   Constitutional: Denies fevers, chills or abnormal weight loss (+) loss of appetite Eyes: Denies blurriness of vision Ears, nose, mouth, throat, and face: Denies mucositis or sore throat Respiratory: Denies cough, dyspnea or wheezes Cardiovascular: Denies palpitation, chest discomfort or lower extremity swelling Gastrointestinal:  Denies heartburn (+) nausea (+) vomiting (+) dairrhea Skin: Denies abnormal skin rashes Lymphatics: Denies new lymphadenopathy or easy bruising Neurological:Denies numbness, tingling or new weaknesses Behavioral/Psych: Mood is stable, no new changes  All other systems were reviewed with the patient and are negative.  MEDICAL HISTORY:  Past Medical History:  Diagnosis Date  . Family history of lung cancer   . Family history of prostate cancer   . Family history of uterine cancer   . Gallstones 10/2017  . Pancreatic cancer ([place])   . Pneumothorax, closed, traumatic    years ago  . PONV (postoperative nausea and vomiting)     SURGICAL HISTORY: Past Surgical History:  Procedure Laterality Date  . BILIARY STENT PLACEMENT  10/31/2017   Procedure: BILIARY STENT PLACEMENT;  Surgeon: [name], MD;  Location: [facility] ENDOSCOPY;  Service: Gastroenterology;;  . ERCP N/A 10/31/2017   Procedure: ENDOSCOPIC RETROGRADE CHOLANGIOPANCREATOGRAPHY (ERCP);  Surgeon: [name], MD;  Location: [facility] ENDOSCOPY;  Service: Gastroenterology;  Laterality: N/A;  . ESOPHAGOGASTRODUODENOSCOPY N/A 11/08/2017   Procedure:  ESOPHAGOGASTRODUODENOSCOPY (EGD);  Surgeon: Milus Banister, MD;  Location: [facility] ENDOSCOPY;  Service: Endoscopy;  Laterality: N/A;  . EUS N/A 11/08/2017   Procedure: UPPER ENDOSCOPIC ULTRASOUND (EUS) RADIAL;  Surgeon: Milus Banister, MD;  Location: [facility] ENDOSCOPY;  Service: Endoscopy;  Laterality: N/A;  . FINE NEEDLE ASPIRATION  11/08/2017   Procedure: FINE NEEDLE ASPIRATION;  Surgeon: Milus Banister, MD;  Location: [facility] ENDOSCOPY;  Service: Endoscopy;;  . IR IMAGING GUIDED PORT INSERTION  11/15/2017  . LAPAROSCOPY N/A 04/25/2018   Procedure: LAPAROSCOPY DIAGNOSTIC;  Surgeon: Stark Klein, MD;  Location: [facility];  Service: General;  Laterality: N/A;  GENERAL AND EPIDURAL ANESTHESIA  . SPHINCTEROTOMY  10/31/2017   Procedure: SPHINCTEROTOMY;  Surgeon: [name], MD;  Location: [facility] ENDOSCOPY;  Service: Gastroenterology;;  . TONSILLECTOMY    . WHIPPLE PROCEDURE N/A 04/25/2018   Procedure: WHIPPLE PROCEDURE, RECONSTRUCTION OF PORTAL VEIN;  Surgeon: Stark Klein, MD;  Location: [facility];  Service: General;  Laterality: N/A;    I have reviewed the social history and family history with the patient and they are unchanged from previous note.  ALLERGIES:  is allergic to latex.  MEDICATIONS:  Current Outpatient Medications  Medication Sig Dispense Refill  . acetaminophen (TYLENOL) 325 MG tablet Take 1-2 tablets (325-650 mg total) by mouth every 4 (four) hours as needed for mild pain.    Marland Kitchen amoxicillin-clavulanate (AUGMENTIN) 875-125 MG tablet Take 1 tablet by mouth 2 (two) times daily. 14 tablet 0  . cyclobenzaprine (FLEXERIL) 5 MG tablet Take 1 tablet (5 mg total) by mouth 3 (three) times daily as needed for muscle  spasms. 60 tablet 1  . metoCLOPramide (REGLAN) 5 MG tablet Take 1 tablet (5 mg total) by mouth every 6 (six) hours as needed for nausea or vomiting. 120 tablet 1  . morphine (MSIR) 15 MG tablet Take 1 tablet (15 mg total) by mouth every 4 (four) hours as needed for severe pain. 60 tablet 0  .  ondansetron (ZOFRAN) 8 MG tablet Take 1 tablet (8 mg total) by mouth every 8 (eight) hours as needed for nausea or vomiting. 30 tablet 0  . pantoprazole (PROTONIX) 40 MG tablet Take 1 tablet (40 mg total) by mouth daily. 60 tablet 3  . potassium chloride SA (K-DUR) 20 MEQ tablet Take 1 tablet (20 mEq total) by mouth daily. 30 tablet 1  . promethazine (PHENERGAN) 25 MG tablet Take 1 tablet (25 mg total) by mouth every 6 (six) hours as needed for nausea or vomiting. 30 tablet 0  . morphine (MS CONTIN) 15 MG 12 hr tablet Take 1 tablet (15 mg total) by mouth every 12 (twelve) hours. 60 tablet 0   No current facility-administered medications for this visit.     PHYSICAL EXAMINATION: ECOG PERFORMANCE STATUS: 2 - Symptomatic, <50% confined to bed  Vitals:   07/08/18 0858  BP: 122/85  Pulse: 73  Resp: 18  Temp: 98.2 F (36.8 C)  SpO2: 100%   Filed Weights   07/08/18 0858  Weight: 109 lb 6.4 oz (49.6 kg)    GENERAL:alert, no distress and comfortable SKIN: skin color, texture, turgor are normal, no rashes or significant lesions EYES: normal, Conjunctiva are pink and non-injected, sclera clear OROPHARYNX:no exudate, no erythema and lips, buccal mucosa, and tongue normal NECK: supple, thyroid normal size, non-tender, without nodularity LYMPH:  no palpable lymphadenopathy in the cervical, axillary  LUNGS: clear to auscultation and percussion with normal breathing effort HEART: regular rate & rhythm and no murmurs and no lower extremity edema ABDOMEN:abdomen soft and normal bowel sounds, midline incision has healed very well, (+) lower and mid-abdominal tenderness, or organomegaly  Musculoskeletal:no cyanosis of digits and no clubbing   NEURO: alert & oriented x 3 with fluent speech, no focal motor/sensory deficits  LABORATORY DATA:  I have reviewed the data as listed CBC Latest Ref Rng & Units 07/08/2018 07/07/2018 07/06/2018  WBC 4.0 - 10.5 K/uL 8.4 7.5 6.5  Hemoglobin 12.0 - 15.0 g/dL  9.9(L) 11.1(L) 11.0(L)  Hematocrit 36.0 - 46.0 % 29.9(L) 34.0(L) 34.0(L)  Platelets 150 - 400 K/uL 200 207 227     CMP Latest Ref Rng & Units 07/08/2018 07/07/2018 07/06/2018  Glucose 70 - 99 mg/dL 101(H) 90 140(H)  BUN 6 - 20 mg/dL <4(L) <5(L) <5(L)  Creatinine 0.44 - 1.00 mg/dL 0.60 0.57 0.69  Sodium 135 - 145 mmol/L 137 135 137  Potassium 3.5 - 5.1 mmol/L 3.0(LL) 3.4(L) 3.3(L)  Chloride 98 - 111 mmol/L 103 103 103  CO2 22 - 32 mmol/L 24 24 23   Calcium 8.9 - 10.3 mg/dL 7.6(L) 8.6(L) 8.7(L)  Total Protein 6.5 - 8.1 g/dL 5.9(L) 6.3(L) 6.6  Total Bilirubin 0.3 - 1.2 mg/dL 0.7 0.9 0.9  Alkaline Phos 38 - 126 U/L 93 103 100  AST 15 - 41 U/L 51(H) 43(H) 31  ALT 0 - 44 U/L 29 29 28       RADIOGRAPHIC STUDIES: I have personally reviewed the radiological images as listed and agreed with the findings in the report. No results found.   ASSESSMENT & PLAN:  Jane Brooks is a 48 y.o. female with  1. 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  -Unfortunately her recovery has been difficult with abdominal pain, nausea, vomiting and diarhrea, multiple ED visits and hospitalization - Persistent nausea and diarrhea, continue on Zofran and Imodium -PERRLA CT abdomen pelvis was negative for cancer recurrence, it showed colitis.  However suspicion of metastatic disease due to her persistent GI symptoms -I recommend a PET scan for further evaluation, to be done in the next 1 to 2 weeks -We will also refer her back to her gastroenterologist Dr. Ardis Hughs for evaluation of her GI symptoms, and possible colonoscopy and EGD, if PET scan negative -We discussed her surgical pathology findings, due to the positive surgical margins, scans negative for distant metastasis, and she recovers reasonably well, will consider  adjuvant radiation with Xeloda.  However, she is not well enough to start treatment for now.   2. Abdominal pain, recent Colitis with N&V -She was admitted to ED recenlty and treated with antiotics.  -Continue pain medication on morphine and flexeril, I will restart MS contin 41m q12h, and refilled her morphine 1735mPRN today  -Continue Antiemetics  -will set up IVF twice a week in my office and weekly visit for symptom management and supportive care for next 2-3 weeks   3. Social and FiAcupuncturistI previously referred to [place] into [place] new apartment. -She now has her puppy and doing well with him.  4. 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 Genetics -She was seen by the geDietitianenetic testhasbeen held due to lack of insurance.  6. Smoking Cessation  -She is currently down to smoking 3/4 ppd -I strongly encouraged her to continue to reduce and completely quit as going into surgery this can effect her recovery. She understands.  -She notes she has further cut back on smoking and continues to work on quitting completely. I encouraged her to continue.    Plan - [facility] VaSandi MealyPA - Continue with IV fluids every Tuesday and Friday for next 3 weeks  - Refill Flexeril 35m58mx daily PRN and morphine 135m31mhrs PRN, and restart MS contin 135mg82mh (refilled today) - f/u with me on 07/16/18 and on 07/26/18 -PET scan in 1-2 weeks -sent a message to Dr. JacobArdis Hughsher f/u and possible endoscopy  No problem-specific Assessment & Plan notes found for this encounter.   Orders Placed This Encounter  Procedures  . NM PET Image Initial (PI) Skull Base To Thigh    Rule out metastasis, pt has persistent GI symptoms since whipple surgery    Standing Status:   Future     Standing Expiration Date:   07/08/2019    Order Specific Question:   If indicated for the ordered procedure, I authorize the administration of a radiopharmaceutical per Radiology protocol    Answer:   Yes    Order Specific Question:   Is the patient pregnant?    Answer:   No    Order Specific Question:   Preferred imaging location?    Answer:   [facility] Specific Question:   Radiology Contrast Protocol - do NOT remove file path  Answer:   [email or web address]   All questions were answered. The patient knows to call the clinic with any problems, questions or concerns. No barriers to learning was detected. I spent 30 minutes counseling the patient face to face. The total time spent in the appointment was 40 minutes and more than 50% was on counseling and review of test results     Truitt Merle, MD 07/08/2018   I, Cloyde Reams Dorshimer, am acting as scribe for Truitt Merle, MD.   I have reviewed the above documentation for accuracy and completeness, and I agree with the above.

## 2018-07-06 ENCOUNTER — Encounter (HOSPITAL_COMMUNITY): Payer: Self-pay

## 2018-07-06 ENCOUNTER — Emergency Department (HOSPITAL_COMMUNITY)
Admission: EM | Admit: 2018-07-06 | Discharge: 2018-07-07 | Disposition: A | Discharge status: Home / Self Care | Payer: Medicaid Other | Source: Home / Self Care | Attending: Emergency Medicine | Admitting: Emergency Medicine

## 2018-07-06 ENCOUNTER — Emergency Department (HOSPITAL_COMMUNITY): Payer: Medicaid Other

## 2018-07-06 ENCOUNTER — Other Ambulatory Visit: Payer: Self-pay

## 2018-07-06 ENCOUNTER — Encounter (HOSPITAL_COMMUNITY): Payer: Self-pay | Admitting: Emergency Medicine

## 2018-07-06 ENCOUNTER — Emergency Department (HOSPITAL_COMMUNITY)
Admission: EM | Admit: 2018-07-06 | Discharge: 2018-07-06 | Disposition: A | Discharge status: Home / Self Care | Payer: Medicaid Other | Attending: Emergency Medicine | Admitting: Emergency Medicine

## 2018-07-06 DIAGNOSIS — I10 Essential (primary) hypertension: Secondary | ICD-10-CM | POA: Insufficient documentation

## 2018-07-06 DIAGNOSIS — Z91040 Latex allergy status: Secondary | ICD-10-CM | POA: Insufficient documentation

## 2018-07-06 DIAGNOSIS — Z87891 Personal history of nicotine dependence: Secondary | ICD-10-CM | POA: Insufficient documentation

## 2018-07-06 DIAGNOSIS — R1084 Generalized abdominal pain: Secondary | ICD-10-CM | POA: Insufficient documentation

## 2018-07-06 DIAGNOSIS — K529 Noninfective gastroenteritis and colitis, unspecified: Secondary | ICD-10-CM | POA: Insufficient documentation

## 2018-07-06 DIAGNOSIS — R197 Diarrhea, unspecified: Secondary | ICD-10-CM | POA: Insufficient documentation

## 2018-07-06 DIAGNOSIS — Z79899 Other long term (current) drug therapy: Secondary | ICD-10-CM | POA: Insufficient documentation

## 2018-07-06 DIAGNOSIS — R112 Nausea with vomiting, unspecified: Secondary | ICD-10-CM

## 2018-07-06 DIAGNOSIS — R1013 Epigastric pain: Secondary | ICD-10-CM | POA: Diagnosis present

## 2018-07-06 DIAGNOSIS — Z8507 Personal history of malignant neoplasm of pancreas: Secondary | ICD-10-CM | POA: Insufficient documentation

## 2018-07-06 LAB — COMPREHENSIVE METABOLIC PANEL
ALT: 30 U/L (ref 0–44)
ALT: 28 U/L (ref 0–44)
AST: 37 U/L (ref 15–41)
AST: 31 U/L (ref 15–41)
Albumin: 2.9 g/dL — ABNORMAL LOW (ref 3.5–5.0)
Albumin: 3.1 g/dL — ABNORMAL LOW (ref 3.5–5.0)
Alkaline Phosphatase: 100 U/L (ref 38–126)
Alkaline Phosphatase: 100 U/L (ref 38–126)
Anion gap: 12 (ref 5–15)
Anion gap: 11 (ref 5–15)
BUN: 6 mg/dL (ref 6–20)
BUN: <5 mg/dL — ABNORMAL LOW (ref 6–20)
CO2: 21 mmol/L — ABNORMAL LOW (ref 22–32)
CO2: 23 mmol/L (ref 22–32)
Calcium: 8.3 mg/dL — ABNORMAL LOW (ref 8.9–10.3)
Calcium: 8.7 mg/dL — ABNORMAL LOW (ref 8.9–10.3)
Chloride: 101 mmol/L (ref 98–111)
Chloride: 103 mmol/L (ref 98–111)
Creatinine, Ser: 0.58 mg/dL (ref 0.44–1.00)
Creatinine, Ser: 0.69 mg/dL (ref 0.44–1.00)
GFR calc Af Amer: >60 mL/min (ref >60)
GFR calc Af Amer: >60 mL/min (ref >60)
GFR calc non Af Amer: >60 mL/min (ref >60)
GFR calc non Af Amer: >60 mL/min (ref >60)
Glucose, Bld: 137 mg/dL — ABNORMAL HIGH (ref 70–99)
Glucose, Bld: 140 mg/dL — ABNORMAL HIGH (ref 70–99)
Potassium: 2.8 mmol/L — ABNORMAL LOW (ref 3.5–5.1)
Potassium: 3.3 mmol/L — ABNORMAL LOW (ref 3.5–5.1)
Sodium: 134 mmol/L — ABNORMAL LOW (ref 135–145)
Sodium: 137 mmol/L (ref 135–145)
Total Bilirubin: 0.8 mg/dL (ref 0.3–1.2)
Total Bilirubin: 0.9 mg/dL (ref 0.3–1.2)
Total Protein: 6.2 g/dL — ABNORMAL LOW (ref 6.5–8.1)
Total Protein: 6.6 g/dL (ref 6.5–8.1)

## 2018-07-06 LAB — CBC WITH DIFFERENTIAL/PLATELET
Abs Immature Granulocytes: 0.03 10*3/uL (ref 0.00–0.07)
Basophils Absolute: 0 10*3/uL (ref 0.0–0.1)
Basophils Relative: 0 %
Eosinophils Absolute: 0.1 10*3/uL (ref 0.0–0.5)
Eosinophils Relative: 1 %
HCT: 32.7 % — ABNORMAL LOW (ref 36.0–46.0)
Hemoglobin: 10.8 g/dL — ABNORMAL LOW (ref 12.0–15.0)
Immature Granulocytes: 0 %
Lymphocytes Relative: 20 %
Lymphs Abs: 1.4 10*3/uL (ref 0.7–4.0)
MCH: 22.4 pg — ABNORMAL LOW (ref 26.0–34.0)
MCHC: 33 g/dL (ref 30.0–36.0)
MCV: 67.7 fL — ABNORMAL LOW (ref 80.0–100.0)
Monocytes Absolute: 0.4 10*3/uL (ref 0.1–1.0)
Monocytes Relative: 6 %
Neutro Abs: 5.1 10*3/uL (ref 1.7–7.7)
Neutrophils Relative %: 73 %
Platelets: 217 10*3/uL (ref 150–400)
RBC: 4.83 MIL/uL (ref 3.87–5.11)
RDW: 20.4 % — ABNORMAL HIGH (ref 11.5–15.5)
WBC: 7.1 10*3/uL (ref 4.0–10.5)
nRBC: 0 % (ref 0.0–0.2)

## 2018-07-06 LAB — CBC
HCT: 34 % — ABNORMAL LOW (ref 36.0–46.0)
Hemoglobin: 11 g/dL — ABNORMAL LOW (ref 12.0–15.0)
MCH: 22 pg — ABNORMAL LOW (ref 26.0–34.0)
MCHC: 32.4 g/dL (ref 30.0–36.0)
MCV: 68.1 fL — ABNORMAL LOW (ref 80.0–100.0)
Platelets: 227 10*3/uL (ref 150–400)
RBC: 4.99 MIL/uL (ref 3.87–5.11)
RDW: 20.7 % — ABNORMAL HIGH (ref 11.5–15.5)
WBC: 6.5 10*3/uL (ref 4.0–10.5)
nRBC: 0 % (ref 0.0–0.2)

## 2018-07-06 LAB — URINALYSIS, ROUTINE W REFLEX MICROSCOPIC
Bilirubin Urine: NEGATIVE
Glucose, UA: NEGATIVE mg/dL
Hgb urine dipstick: NEGATIVE
Ketones, ur: NEGATIVE mg/dL
Leukocytes,Ua: NEGATIVE
Nitrite: NEGATIVE
Protein, ur: NEGATIVE mg/dL
Specific Gravity, Urine: 1.02 (ref 1.005–1.030)
pH: 5 (ref 5.0–8.0)

## 2018-07-06 LAB — I-STAT BETA HCG BLOOD, ED (MC, WL, AP ONLY)
I-stat hCG, quantitative: <5 m[IU]/mL (ref <5)
I-stat hCG, quantitative: <5 m[IU]/mL (ref <5)

## 2018-07-06 LAB — LIPASE, BLOOD
Lipase: 17 U/L (ref 11–51)
Lipase: 18 U/L (ref 11–51)

## 2018-07-06 LAB — I-STAT CREATININE, ED: Creatinine, Ser: 0.3 mg/dL — ABNORMAL LOW (ref 0.44–1.00)

## 2018-07-06 LAB — LACTIC ACID, PLASMA: Lactic Acid, Venous: 1.7 mmol/L (ref 0.5–1.9)

## 2018-07-06 MED ORDER — IOHEXOL 300 MG/ML  SOLN
75.0000 mL | Freq: Once | INTRAMUSCULAR | Status: AC | PRN
  Administered 2018-07-06: 75 mL via INTRAVENOUS

## 2018-07-06 MED ORDER — HEPARIN SOD (PORK) LOCK FLUSH 100 UNIT/ML IV SOLN
500.0000 [IU] | Freq: Once | INTRAVENOUS | Status: AC
  Administered 2018-07-06: 500 [IU]
  Filled 2018-07-06: qty 5

## 2018-07-06 MED ORDER — ONDANSETRON 4 MG PO TBDP
4.0000 mg | ORAL_TABLET | Freq: Once | ORAL | Status: AC | PRN
  Administered 2018-07-06: 4 mg via ORAL
  Filled 2018-07-06: qty 1

## 2018-07-06 MED ORDER — SODIUM CHLORIDE 0.9% FLUSH
3.0000 mL | Freq: Once | INTRAVENOUS | Status: AC
  Administered 2018-07-07: 3 mL via INTRAVENOUS

## 2018-07-06 MED ORDER — MAGNESIUM OXIDE 400 (241.3 MG) MG PO TABS
800.0000 mg | ORAL_TABLET | Freq: Once | ORAL | Status: AC
  Administered 2018-07-06: 06:00:00 800 mg via ORAL
  Filled 2018-07-06: qty 2

## 2018-07-06 MED ORDER — POTASSIUM CHLORIDE CRYS ER 20 MEQ PO TBCR
40.0000 meq | EXTENDED_RELEASE_TABLET | Freq: Once | ORAL | Status: AC
  Administered 2018-07-06: 40 meq via ORAL
  Filled 2018-07-06: qty 2

## 2018-07-06 MED ORDER — SODIUM CHLORIDE 0.9 % IV BOLUS
1000.0000 mL | Freq: Once | INTRAVENOUS | Status: AC
  Administered 2018-07-06: 1000 mL via INTRAVENOUS

## 2018-07-06 MED ORDER — AMOXICILLIN-POT CLAVULANATE 875-125 MG PO TABS: 1.0000 | ORAL_TABLET | Freq: Two times a day (BID) | ORAL | 0 refills | Status: DC

## 2018-07-06 MED ORDER — ONDANSETRON HCL 8 MG PO TABS: 8.0000 mg | ORAL_TABLET | Freq: Three times a day (TID) | ORAL | 0 refills | Status: DC | PRN

## 2018-07-06 MED ORDER — MORPHINE SULFATE (PF) 4 MG/ML IV SOLN
8.0000 mg | Freq: Once | INTRAVENOUS | Status: AC
  Administered 2018-07-06: 8 mg via INTRAVENOUS
  Filled 2018-07-06: qty 2

## 2018-07-06 MED ORDER — DIPHENHYDRAMINE HCL 50 MG/ML IJ SOLN
25.0000 mg | Freq: Once | INTRAMUSCULAR | Status: AC
  Administered 2018-07-06: 25 mg via INTRAVENOUS
  Filled 2018-07-06: qty 1

## 2018-07-06 MED ORDER — ONDANSETRON HCL 4 MG/2ML IJ SOLN
4.0000 mg | Freq: Once | INTRAMUSCULAR | Status: AC
  Administered 2018-07-06: 4 mg via INTRAVENOUS
  Filled 2018-07-06: qty 2

## 2018-07-06 MED ORDER — MORPHINE SULFATE 15 MG PO TABS: 15.0000 mg | ORAL_TABLET | ORAL | 0 refills | Status: DC | PRN

## 2018-07-06 MED ORDER — PROCHLORPERAZINE EDISYLATE 10 MG/2ML IJ SOLN
10.0000 mg | Freq: Once | INTRAMUSCULAR | Status: AC
  Administered 2018-07-06: 10 mg via INTRAVENOUS
  Filled 2018-07-06: qty 2

## 2018-07-06 NOTE — ED Notes (Signed)
Pt verbalized understanding of d/c instructions and has no further questions, VSS, NAD.  

## 2018-07-06 NOTE — ED Notes (Signed)
Pt declined Tylenol in triage. Last Morphine at home 1800

## 2018-07-06 NOTE — ED Triage Notes (Signed)
Pt presents to ED with complaint of abdominal pain with associated nausea, vomiting and diarrhea. Pt states symptoms have been intermittent since having whipple surgery in April. Pain is located in the epigastric region.  Pt denies fever. VSS.

## 2018-07-06 NOTE — ED Notes (Signed)
Nurse collecting labs.

## 2018-07-06 NOTE — ED Provider Notes (Signed)
[facility] EMERGENCY DEPARTMENT Provider Note   CSN: [id number] Arrival date & time: 07/06/18  0175    History   Chief Complaint Chief Complaint  Patient presents with   Abdominal Pain    HPI Jane Jane Brooks is a 48 y.o. female.     48 yo F with a chief complaint of epigastric abdominal pain.  Going on since Jane Brooks morning.  She states she is had pain like Jane Brooks off and on since she had her Whipple procedure done in April.  Seems to happen about every 3 weeks or so.  Having some nausea vomiting and diarrhea as well.  Had a temperature of 100 yesterday.  No cough congestion or shortness of breath.  No urinary symptoms.  The history is provided by the patient.  Abdominal Pain Pain location:  Epigastric Pain quality: sharp and shooting   Pain radiates to:  Does not radiate Pain severity:  Moderate Onset quality:  Sudden Duration:  1 day Timing:  Constant Progression:  Unchanged Chronicity:  Recurrent Relieved by:  Nothing Worsened by:  Nothing Ineffective treatments:  None tried Associated symptoms: diarrhea, nausea and vomiting   Associated symptoms: no chest pain, no chills, no dysuria, no fever and no shortness of breath     Past Medical History:  Diagnosis Date   Family history of lung cancer    Family history of prostate cancer    Family history of uterine cancer    Gallstones 10/2017   Pancreatic cancer (Jane Jane Brooks)    Pneumothorax, closed, traumatic    years ago   PONV (postoperative nausea and vomiting)     Patient Active Problem List   Diagnosis Date Noted   Hospital discharge follow-up 06/29/2018   Nausea & vomiting 06/29/2018   Colitis 06/15/2018   Hypokalemia 05/22/2018   Peripheral edema    Prediabetes    Acute blood loss anemia    Thrombocytopenia ([place])    Urinary retention    Sinus tachycardia    Debility 05/16/2018   Acute respiratory failure with hypoxemia ([place]) 04/30/2018   HCAP (healthcare-associated  pneumonia) 04/30/2018   Essential hypertension 04/30/2018   Chronic prescription opiate use 04/30/2018   Postoperative pain 04/30/2018   Tachycardia 04/30/2018   Tachypnea 04/30/2018   Acute respiratory alkalosis 04/30/2018   Adenocarcinoma of head of pancreas (Jane Jane Brooks) 04/25/2018   Genetic testing 01/24/2018   Family history of uterine cancer    Family history of prostate cancer    Family history of lung cancer    Pancreatic cancer (Jane Jane Brooks) 11/09/2017   Pancreatic mass    Cholecystitis 10/30/2017   Jaundice 10/30/2017    Past Surgical History:  Procedure Laterality Date   BILIARY STENT PLACEMENT  10/31/2017   Procedure: BILIARY STENT PLACEMENT;  Surgeon: [name], MD;  Location: [place];  Service: Gastroenterology;;   ERCP N/A 10/31/2017   Procedure: ENDOSCOPIC RETROGRADE CHOLANGIOPANCREATOGRAPHY (ERCP);  Surgeon: [name], MD;  [facility] END[facility]COPY;  Service: Gastroenterology;  Laterality: N/A;   ESOPHAGOGASTRODUODENOSCOPY N/A 11/08/2017   Procedure: ESOPHAGOGASTRODUODENOSCOPY (EGD);  Surgeon: Milus Banister, MD;  Location: [facility] ENDOSCOPY;  Service: Endoscopy;  Laterality: N/A;   EUS N/A 11/08/2017   Procedure: UPPER ENDOSCOPIC ULTRASOUND (EUS) RADIAL;  Surgeon: Milus Banister, MD;  Location: [facility] ENDOSCOPY;  Service: Endoscopy;  Laterality: N/A;   FINE NEEDLE ASPIRATION  11/08/2017   Procedure: FINE NEEDLE ASPIRATION;  Surgeon: Milus Banister, MD;  Location: [facility] ENDOSCOPY;  Service: Endoscopy;;   IR IMAGING GUIDED PORT INSERTION  11/15/2017  LAPAROSCOPY N/A 04/25/2018   Procedure: LAPAROSCOPY DIAGNOSTIC;  Surgeon: Stark Klein, MD;  Location: [place];  Service: General;  Laterality: N/A;  GENERAL AND EPIDURAL ANESTHESIA   SPHINCTEROTOMY  10/31/2017   Procedure: SPHINCTEROTOMY;  Surgeon: [name], MD;  Location: [place];  Service: Gastroenterology;;   TONSILLECTOMY     WHIPPLE PROCEDURE N/A 04/25/2018   Procedure: WHIPPLE PROCEDURE,  RECONSTRUCTION OF PORTAL VEIN;  Surgeon: Stark Klein, MD;  Location: [place];  Service: General;  Laterality: N/A;     OB History   No obstetric history on file.      Home Medications    Prior to Admission medications   Medication Sig Start Date End Date Taking? Authorizing Provider  acetaminophen (TYLENOL) 325 MG tablet Take 1-2 tablets (325-650 mg total) by mouth every 4 (four) hours as needed for mild pain. 05/21/18   Love, Ivan Anchors, PA-C  amoxicillin-clavulanate (AUGMENTIN) 875-125 MG tablet Take 1 tablet by mouth 2 (two) times daily. 07/06/18   Deno Etienne, DO  cyclobenzaprine (FLEXERIL) 5 MG tablet Take 1 tablet (5 mg total) by mouth 3 (three) times daily as needed for muscle spasms. 05/24/18   Meredith Staggers, MD  metoCLOPramide (REGLAN) 5 MG tablet Take 1 tablet (5 mg total) by mouth every 6 (six) hours as needed for nausea or vomiting. 06/19/18   Stark Klein, MD  morphine (MSIR) 15 MG tablet Take 1 tablet (15 mg total) by mouth every 4 (four) hours as needed for severe pain. 07/06/18   Deno Etienne, DO  ondansetron (ZOFRAN) 8 MG tablet Take 1 tablet (8 mg total) by mouth every 8 (eight) hours as needed for nausea or vomiting. 07/06/18   Deno Etienne, DO  pantoprazole (PROTONIX) 40 MG tablet Take 1 tablet (40 mg total) by mouth daily. 06/19/18   Stark Klein, MD  potassium chloride SA (K-DUR) 20 MEQ tablet Take 1 tablet (20 mEq total) by mouth daily. 05/21/18   Love, Ivan Anchors, PA-C  promethazine (PHENERGAN) 25 MG tablet Take 1 tablet (25 mg total) by mouth every 6 (six) hours as needed for nausea or vomiting. 06/19/18   Stark Klein, MD    Family History Family History  Problem Relation Age of Onset   Diabetes Mother    Hypertension Mother    COPD Mother    Uterine cancer Mother 102       had hysterectomy   Diabetes Sister    Hypertension Sister    Lung cancer Maternal Grandmother        lung cancer   Hypertension Sister     Social History Social History   Tobacco Use    Smoking status: Former Smoker    Packs/day: 1.00    Years: 37.00    Pack years: 37.00    Types: Cigarettes    Quit date: 04/25/2018    Years since quitting: 0.1   Smokeless tobacco: Never Used  Substance Use Topics   Alcohol use: Not Currently   Drug use: Never     Allergies   Latex   Review of Systems Review of Systems  Constitutional: Negative for chills and fever.  HENT: Negative for congestion and rhinorrhea.   Eyes: Negative for redness and visual disturbance.  Respiratory: Negative for shortness of breath and wheezing.   Cardiovascular: Negative for chest pain and palpitations.  Gastrointestinal: Positive for abdominal pain, diarrhea, nausea and vomiting.  Genitourinary: Negative for dysuria and urgency.  Musculoskeletal: Negative for arthralgias and myalgias.  Skin: Negative for pallor and  wound.  Neurological: Negative for dizziness and headaches.     Physical Exam Updated Vital Signs BP (!) 148/66    Pulse 70    Temp (!) 97.4 F (36.3 C) (Oral)    Resp 16    Ht 4' 10"  (1.473 m)    Wt 45.4 kg    SpO2 100%    BMI 20.90 kg/m   Physical Exam Vitals signs and nursing note reviewed.  Constitutional:      General: She is not in acute distress.    Appearance: She is well-developed. She is not diaphoretic.  HENT:     Head: Normocephalic and atraumatic.  Eyes:     Pupils: Pupils are equal, round, and reactive to light.  Neck:     Musculoskeletal: Normal range of motion and neck supple.  Cardiovascular:     Rate and Rhythm: Normal rate and regular rhythm.     Heart sounds: No murmur. No friction rub. No gallop.   Pulmonary:     Effort: Pulmonary effort is normal.     Breath sounds: No wheezing or rales.  Abdominal:     General: There is no distension.     Palpations: Abdomen is soft.     Tenderness: There is abdominal tenderness (diffuse worst to the epigastrium).     Comments: Midline abdominal scar.  Diffuse abdominal pain but worse to the epigastrium.   Abdomen is soft  Musculoskeletal:        General: No tenderness.  Skin:    General: Skin is warm and dry.  Neurological:     Mental Status: She is alert and oriented to person, place, and time.  Psychiatric:        Behavior: Behavior normal.      ED Treatments / Results  Labs (all labs ordered are listed, but only abnormal results are displayed) Labs Reviewed  CBC WITH DIFFERENTIAL/PLATELET - Abnormal; Notable for the following components:      Result Value   Hemoglobin 10.8 (*)    HCT 32.7 (*)    MCV 67.7 (*)    MCH 22.4 (*)    RDW 20.4 (*)    All other components within normal limits  COMPREHENSIVE METABOLIC PANEL - Abnormal; Notable for the following components:   Sodium 134 (*)    Potassium 2.8 (*)    CO2 21 (*)    Glucose, Bld 137 (*)    Calcium 8.3 (*)    Total Protein 6.2 (*)    Albumin 2.9 (*)    All other components within normal limits  I-STAT CREATININE, ED - Abnormal; Notable for the following components:   Creatinine, Ser 0.30 (*)    All other components within normal limits  LIPASE, BLOOD  LACTIC ACID, PLASMA  LACTIC ACID, PLASMA  I-STAT BETA HCG BLOOD, ED ([facility], [facility], [facility] ONLY)    EKG None  Radiology Ct Abdomen Pelvis W Contrast  Result Date: 07/06/2018 CLINICAL DATA:  48 y/o F; history of pancreatic cancer, ERCP, biliary stent placement. History of Whipple procedure 04/25/2018 with ongoing abdominal pain. EXAM: CT ABDOMEN AND PELVIS WITH CONTRAST TECHNIQUE: Multidetector CT imaging of the abdomen and pelvis was performed using the standard protocol following bolus administration of intravenous contrast. CONTRAST:  84m OMNIPAQUE IOHEXOL 300 MG/ML  SOLN COMPARISON:  06/15/2018 CT abdomen and pelvis. 05/03/2018 MRI of the abdomen. FINDINGS: Lower chest: No acute abnormality. Hepatobiliary: Diffuse hepatic steatosis. Stable heterogeneous non masslike enhancement within right lobe of liver in a similar pattern to the  prior MRI given differences in technique.  Mild pneumobilia. No biliary ductal dilatation. No obstructive changes of the hepatobiliary limb. Pancreas: Status post Whipple procedure. No recurrent mass identified. Normal appearance of residual pancreatic tail. Spleen: Normal in size without focal abnormality. Adrenals/Urinary Tract: Adrenal glands are unremarkable. Kidneys are normal, without renal calculi, focal lesion, or hydronephrosis. Bladder is unremarkable. Stomach/Bowel: Diffuse low-attenuation wall thickening of the colon as well as the stomach with mucosal enhancement. No obstructive or inflammatory changes of the small bowel. Vascular/Lymphatic: Aortic atherosclerosis. No enlarged abdominal or pelvic lymph nodes. Reproductive: Uterus and bilateral adnexa are unremarkable. Other: Small volume of ascites. Musculoskeletal: No fracture is seen. IMPRESSION: 1. Progressive diffuse wall thickening and mucosal enhancement of the colon compatible with colitis. New small volume of ascites. 2. New wall thickening and mucosal enhancement of the stomach, possibly concurrent colitis. 3. Status post Whipple procedure. No recurrent mass identified. 4. Diffuse hepatic steatosis. 5. Grossly stable heterogeneous non masslike enhancement within right lobe of liver from prior MRI where it is better characterized. 6.  Aortic Atherosclerosis (ICD10-I70.0). Electronically Signed   By: Kristine Garbe M.D.   On: 07/06/2018 05:51    Procedures Procedures (including critical care time)  Medications Ordered in ED Medications  sodium chloride 0.9 % bolus 1,000 mL (0 mLs Intravenous Stopped 07/06/18 0551)  morphine 4 MG/ML injection 8 mg (8 mg Intravenous Given 07/06/18 0409)  prochlorperazine (COMPAZINE) injection 10 mg (10 mg Intravenous Given 07/06/18 0409)  diphenhydrAMINE (BENADRYL) injection 25 mg (25 mg Intravenous Given 07/06/18 0410)  iohexol (OMNIPAQUE) 300 MG/ML solution 75 mL (75 mLs Intravenous Contrast Given 07/06/18 0524)  potassium chloride SA  (K-DUR) CR tablet 40 mEq (40 mEq Oral Given 07/06/18 0549)  magnesium oxide (MAG-OX) tablet 800 mg (800 mg Oral Given 07/06/18 0549)  morphine 4 MG/ML injection 8 mg (8 mg Intravenous Given 07/06/18 0545)  ondansetron (ZOFRAN) injection 4 mg (4 mg Intravenous Given 07/06/18 0548)     Initial Impression / Assessment and Plan / ED Course  I have reviewed the triage vital signs and the nursing notes.  Pertinent labs & imaging results that were available during my care of the patient were reviewed by me and considered in my medical decision making (see chart for details).        48 yo F with a chief complaint of diffuse abdominal pain but worse in the epigastrium going on since Jane Brooks morning.  Patient has a history of pancreatic cancer and underwent 8 cycles of chemotherapy followed by a Whipple procedure done 2 months ago.  She was recently seen about 3 weeks ago for a similar complaint.  At that time she had improvement with pain and nausea medication and her lab work was unremarkable.  Will obtain lab work CT scan fluids pain meds reassess.  CT scan with colitis.  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 Dema Severin, general surgery.  He did not feel that the tumor likely to be related.  Recommended GI follow-up.  We will have her follow-up with Dr. Barry Dienes in the office given GI follow-up.  She has an appointment with her oncologist on Monday.  She is without her narcotics that are typically prescribed by her general surgeon, will provide her a short course of narcotics to get her until Monday.  6:54 AM:  I have discussed the diagnosis/risks/treatment options with the patient and believe the pt to be eligible  for discharge home to follow-up with PCP, oncology, gen surgery, GI. We also discussed returning to the ED immediately if new or worsening sx occur. We discussed the sx which are most concerning (e.g., sudden worsening  pain, fever, inability to tolerate by mouth) that necessitate immediate return. Medications administered to the patient during their visit and any new prescriptions provided to the patient are listed below.  Medications given during Jane Brooks visit Medications  sodium chloride 0.9 % bolus 1,000 mL (0 mLs Intravenous Stopped 07/06/18 0551)  morphine 4 MG/ML injection 8 mg (8 mg Intravenous Given 07/06/18 0409)  prochlorperazine (COMPAZINE) injection 10 mg (10 mg Intravenous Given 07/06/18 0409)  diphenhydrAMINE (BENADRYL) injection 25 mg (25 mg Intravenous Given 07/06/18 0410)  iohexol (OMNIPAQUE) 300 MG/ML solution 75 mL (75 mLs Intravenous Contrast Given 07/06/18 0524)  potassium chloride SA (K-DUR) CR tablet 40 mEq (40 mEq Oral Given 07/06/18 0549)  magnesium oxide (MAG-OX) tablet 800 mg (800 mg Oral Given 07/06/18 0549)  morphine 4 MG/ML injection 8 mg (8 mg Intravenous Given 07/06/18 0545)  ondansetron (ZOFRAN) injection 4 mg (4 mg Intravenous Given 07/06/18 0548)     The patient appears reasonably screen and/or stabilized for discharge and I doubt any other medical condition or other [facility] requiring further screening, evaluation, or treatment in the ED at Jane Brooks time prior to discharge.    Final Clinical Impressions(s) / ED Diagnoses   Final diagnoses:  Colitis    ED Discharge Orders         Ordered    amoxicillin-clavulanate (AUGMENTIN) 875-125 MG tablet  2 times daily     07/06/18 0652    morphine (MSIR) 15 MG tablet  Every 4 hours PRN     07/06/18 0652    ondansetron (ZOFRAN) 8 MG tablet  Every 8 hours PRN     07/06/18 [place], [place], DO 07/06/18 [id number]

## 2018-07-06 NOTE — ED Notes (Signed)
Patient transported to CT 

## 2018-07-06 NOTE — ED Triage Notes (Signed)
Pt presents with n/v/d, actively vomiting in triage. Pt seen for same last night, pt reports unable to keep down zofran or prescribed antbx.

## 2018-07-06 NOTE — Discharge Instructions (Addendum)
I discussed the case with the general surgeon that is on-call and he felt that the colitis was probably unrelated to your prior surgery.  He recommended that you follow-up with gastroenterology , if you do not have a GI doctor I have put the information for the GI doctor that is on-call today.  [place] follow-up with Dr. Barry Dienes.  Return for worsening pain, fever

## 2018-07-07 ENCOUNTER — Encounter (HOSPITAL_COMMUNITY): Payer: Self-pay | Admitting: Emergency Medicine

## 2018-07-07 ENCOUNTER — Other Ambulatory Visit: Payer: Self-pay

## 2018-07-07 ENCOUNTER — Emergency Department (HOSPITAL_COMMUNITY)
Admission: EM | Admit: 2018-07-07 | Discharge: 2018-07-07 | Disposition: A | Discharge status: Home / Self Care | Payer: Medicaid Other | Attending: Emergency Medicine | Admitting: Emergency Medicine

## 2018-07-07 DIAGNOSIS — R1032 Left lower quadrant pain: Secondary | ICD-10-CM | POA: Diagnosis present

## 2018-07-07 DIAGNOSIS — Z87891 Personal history of nicotine dependence: Secondary | ICD-10-CM | POA: Diagnosis not present

## 2018-07-07 DIAGNOSIS — Z8507 Personal history of malignant neoplasm of pancreas: Secondary | ICD-10-CM | POA: Insufficient documentation

## 2018-07-07 DIAGNOSIS — I10 Essential (primary) hypertension: Secondary | ICD-10-CM | POA: Diagnosis not present

## 2018-07-07 DIAGNOSIS — Z79899 Other long term (current) drug therapy: Secondary | ICD-10-CM | POA: Diagnosis not present

## 2018-07-07 DIAGNOSIS — Z91040 Latex allergy status: Secondary | ICD-10-CM | POA: Insufficient documentation

## 2018-07-07 DIAGNOSIS — K529 Noninfective gastroenteritis and colitis, unspecified: Secondary | ICD-10-CM | POA: Diagnosis not present

## 2018-07-07 LAB — COMPREHENSIVE METABOLIC PANEL
ALT: 29 U/L (ref 0–44)
AST: 43 U/L — ABNORMAL HIGH (ref 15–41)
Albumin: 3.2 g/dL — ABNORMAL LOW (ref 3.5–5.0)
Alkaline Phosphatase: 103 U/L (ref 38–126)
Anion gap: 8 (ref 5–15)
BUN: <5 mg/dL — ABNORMAL LOW (ref 6–20)
CO2: 24 mmol/L (ref 22–32)
Calcium: 8.6 mg/dL — ABNORMAL LOW (ref 8.9–10.3)
Chloride: 103 mmol/L (ref 98–111)
Creatinine, Ser: 0.57 mg/dL (ref 0.44–1.00)
GFR calc Af Amer: >60 mL/min (ref >60)
GFR calc non Af Amer: >60 mL/min (ref >60)
Glucose, Bld: 90 mg/dL (ref 70–99)
Potassium: 3.4 mmol/L — ABNORMAL LOW (ref 3.5–5.1)
Sodium: 135 mmol/L (ref 135–145)
Total Bilirubin: 0.9 mg/dL (ref 0.3–1.2)
Total Protein: 6.3 g/dL — ABNORMAL LOW (ref 6.5–8.1)

## 2018-07-07 LAB — CBC WITH DIFFERENTIAL/PLATELET
Abs Immature Granulocytes: 0 10*3/uL (ref 0.00–0.07)
Basophils Absolute: 0.1 10*3/uL (ref 0.0–0.1)
Basophils Relative: 1 %
Eosinophils Absolute: 0.1 10*3/uL (ref 0.0–0.5)
Eosinophils Relative: 1 %
HCT: 34 % — ABNORMAL LOW (ref 36.0–46.0)
Hemoglobin: 11.1 g/dL — ABNORMAL LOW (ref 12.0–15.0)
Lymphocytes Relative: 12 %
Lymphs Abs: 0.9 10*3/uL (ref 0.7–4.0)
MCH: 22.2 pg — ABNORMAL LOW (ref 26.0–34.0)
MCHC: 32.6 g/dL (ref 30.0–36.0)
MCV: 67.9 fL — ABNORMAL LOW (ref 80.0–100.0)
Monocytes Absolute: 0.4 10*3/uL (ref 0.1–1.0)
Monocytes Relative: 5 %
Neutro Abs: 6.1 10*3/uL (ref 1.7–7.7)
Neutrophils Relative %: 81 %
Platelets: 207 10*3/uL (ref 150–400)
RBC: 5.01 MIL/uL (ref 3.87–5.11)
RDW: 20.4 % — ABNORMAL HIGH (ref 11.5–15.5)
WBC: 7.5 10*3/uL (ref 4.0–10.5)
nRBC: 0 % (ref 0.0–0.2)
nRBC: 0 /100 WBC

## 2018-07-07 LAB — URINALYSIS, ROUTINE W REFLEX MICROSCOPIC
Bilirubin Urine: NEGATIVE
Glucose, UA: NEGATIVE mg/dL
Hgb urine dipstick: NEGATIVE
Ketones, ur: NEGATIVE mg/dL
Leukocytes,Ua: NEGATIVE
Nitrite: NEGATIVE
Protein, ur: NEGATIVE mg/dL
Specific Gravity, Urine: 1.016 (ref 1.005–1.030)
pH: 5 (ref 5.0–8.0)

## 2018-07-07 LAB — LIPASE, BLOOD: Lipase: 17 U/L (ref 11–51)

## 2018-07-07 MED ORDER — ONDANSETRON HCL 4 MG/2ML IJ SOLN
4.0000 mg | Freq: Once | INTRAMUSCULAR | Status: AC
  Administered 2018-07-07: 05:00:00 4 mg via INTRAVENOUS
  Filled 2018-07-07: qty 2

## 2018-07-07 MED ORDER — OXYCODONE-ACETAMINOPHEN 5-325 MG PO TABS: 1.0000 | ORAL_TABLET | Freq: Four times a day (QID) | ORAL | 0 refills | Status: DC | PRN

## 2018-07-07 MED ORDER — ONDANSETRON HCL 4 MG/2ML IJ SOLN
4.0000 mg | Freq: Once | INTRAMUSCULAR | Status: AC
  Administered 2018-07-07: 15:00:00 4 mg via INTRAVENOUS
  Filled 2018-07-07: qty 2

## 2018-07-07 MED ORDER — PIPERACILLIN-TAZOBACTAM 3.375 G IVPB 30 MIN
3.3750 g | Freq: Once | INTRAVENOUS | Status: AC
  Administered 2018-07-07: 3.375 g via INTRAVENOUS
  Filled 2018-07-07: qty 50

## 2018-07-07 MED ORDER — HEPARIN SOD (PORK) LOCK FLUSH 100 UNIT/ML IV SOLN
500.0000 [IU] | Freq: Once | INTRAVENOUS | Status: DC
  Filled 2018-07-07: qty 5

## 2018-07-07 MED ORDER — ONDANSETRON HCL 4 MG/2ML IJ SOLN
4.0000 mg | Freq: Once | INTRAMUSCULAR | Status: AC
  Administered 2018-07-07: 4 mg via INTRAVENOUS
  Filled 2018-07-07: qty 2

## 2018-07-07 MED ORDER — SODIUM CHLORIDE 0.9 % IV BOLUS
1000.0000 mL | Freq: Once | INTRAVENOUS | Status: AC
  Administered 2018-07-07: 1000 mL via INTRAVENOUS

## 2018-07-07 MED ORDER — SODIUM CHLORIDE 0.9 % IV BOLUS (SEPSIS)
1000.0000 mL | Freq: Once | INTRAVENOUS | Status: AC
  Administered 2018-07-07: 03:00:00 1000 mL via INTRAVENOUS

## 2018-07-07 MED ORDER — HYDROMORPHONE HCL 1 MG/ML IJ SOLN
1.0000 mg | Freq: Once | INTRAMUSCULAR | Status: AC
  Administered 2018-07-07: 1 mg via INTRAVENOUS
  Filled 2018-07-07: qty 1

## 2018-07-07 NOTE — ED Notes (Signed)
Patient ambulatory to bathroom without assistance or difficulty.

## 2018-07-07 NOTE — ED Notes (Signed)
Pt tolerated PO fluids

## 2018-07-07 NOTE — ED Notes (Signed)
Pt c/o abdominal pain 7/10. Pt said she has taken something for pain about 3pm but did not feel better and asks for more pain meds.

## 2018-07-07 NOTE — Discharge Instructions (Addendum)
Be sure  to continue taking all of your home pain medicines as well as her home antibiotics. Return if you have continued vomiting or worsening pain in the next 24 to 48 hours

## 2018-07-07 NOTE — ED Notes (Signed)
Patient verbalizes understanding of discharge instructions. Opportunity for questioning and answers were provided. Armband removed by staff, pt discharged from ED. Ambulated out to lobby  

## 2018-07-07 NOTE — ED Notes (Signed)
Patient verbalizes understanding of discharge instructions. Opportunity for questioning and answers were provided. Armband removed by staff, pt discharged from ED home via POV.  

## 2018-07-07 NOTE — ED Provider Notes (Signed)
[facility] EMERGENCY DEPARTMENT Provider Note   CSN: [id number] Arrival date & time: 07/07/18  1252     History   Chief Complaint Chief Complaint  Patient presents with   Nausea   Emesis    HPI Jane Brooks is a 48 y.o. female.     The history is provided by the patient and medical records. No language interpreter was used.  Emesis    48 year old female presenting for evaluation of abdominal pain with associate nausea vomiting diarrhea.  Patient has prior history of pancreatic cancer as well as gallstones.  She has a Whipple procedure in April by Dr. Benna Dunks.  For the past 6 days she has had persistent diffuse abdominal cramping now with increased nausea vomiting diarrhea.  From prior note, patient was seen in the ED on June 20 and had a CT scan that shows evidence of colitis.  She was subsequently seen again early this morning for abdominal pain because she was unable to tolerate the medication.  She received medication in the ED and subsequently discharged.  Patient returned this morning due to worsening of symptoms but did receive treatment.  She was offered to be admitted however she has to go home to care for her pets.  She now return because she is unable to keep anything down and request to be managed further.  No active fever no dysuria no chest pain shortness of breath or cough.  Past Medical History:  Diagnosis Date   Family history of lung cancer    Family history of prostate cancer    Family history of uterine cancer    Gallstones 10/2017   Pancreatic cancer ([place])    Pneumothorax, closed, traumatic    years ago   PONV (postoperative nausea and vomiting)     Patient Active Problem List   Diagnosis Date Noted   Hospital discharge follow-up 06/29/2018   Nausea & vomiting 06/29/2018   Colitis 06/15/2018   Hypokalemia 05/22/2018   Peripheral edema    Prediabetes    Acute blood loss anemia    Thrombocytopenia (HCC)     Urinary retention    Sinus tachycardia    Debility 05/16/2018   Acute respiratory failure with hypoxemia ([place]) 04/30/2018   HCAP (healthcare-associated pneumonia) 04/30/2018   Essential hypertension 04/30/2018   Chronic prescription opiate use 04/30/2018   Postoperative pain 04/30/2018   Tachycardia 04/30/2018   Tachypnea 04/30/2018   Acute respiratory alkalosis 04/30/2018   Adenocarcinoma of head of pancreas ([place]) 04/25/2018   Genetic testing 01/24/2018   Family history of uterine cancer    Family history of prostate cancer    Family history of lung cancer    Pancreatic cancer ([place]) 11/09/2017   Pancreatic mass    Cholecystitis 10/30/2017   Jaundice 10/30/2017    Past Surgical History:  Procedure Laterality Date   BILIARY STENT PLACEMENT  10/31/2017   Procedure: BILIARY STENT PLACEMENT;  Surgeon: [name], MD;  Location: [place];  Service: Gastroenterology;;   ERCP N/A 10/31/2017   Procedure: ENDOSCOPIC RETROGRADE CHOLANGIOPANCREATOGRAPHY (ERCP);  Surgeon: [name], MD;  Location: [facility] ENDOSCOPY;  Service: Gastroenterology;  Laterality: N/A;   ESOPHAGOGASTRODUODENOSCOPY N/A 11/08/2017   Procedure: ESOPHAGOGASTRODUODENOSCOPY (EGD);  Surgeon: Milus Banister, MD;  Location: [facility] ENDOSCOPY;  Service: Endoscopy;  Laterality: N/A;   EUS N/A 11/08/2017   Procedure: UPPER ENDOSCOPIC ULTRASOUND (EUS) RADIAL;  Surgeon: Milus Banister, MD;  Location: [facility] ENDOSCOPY;  Service: Endoscopy;  Laterality: N/A;   FINE NEEDLE ASPIRATION  11/08/2017   Procedure: FINE NEEDLE ASPIRATION;  Surgeon: Milus Banister, MD;  Location: [facility] ENDOSCOPY;  Service: Endoscopy;;   IR IMAGING GUIDED PORT INSERTION  11/15/2017   LAPAROSCOPY N/A 04/25/2018   Procedure: LAPAROSCOPY DIAGNOSTIC;  Surgeon: Stark Klein, MD;  Location: [facility] [place];  Service: General;  Laterality: N/A;  GENERAL AND EPIDURAL ANESTHESIA   SPHINCTEROTOMY  10/31/2017   Procedure: SPHINCTEROTOMY;  Surgeon:  [name], MD;  Location: [place];  Service: Gastroenterology;;   TONSILLECTOMY     WHIPPLE PROCEDURE N/A 04/25/2018   Procedure: WHIPPLE PROCEDURE, RECONSTRUCTION OF PORTAL VEIN;  Surgeon: Stark Klein, MD;  Location: [place];  Service: General;  Laterality: N/A;     OB History   No obstetric history on file.      Home Medications    Prior to Admission medications   Medication Sig Start Date End Date Taking? Authorizing Provider  acetaminophen (TYLENOL) 325 MG tablet Take 1-2 tablets (325-650 mg total) by mouth every 4 (four) hours as needed for mild pain. 05/21/18   Love, Ivan Anchors, PA-C  amoxicillin-clavulanate (AUGMENTIN) 875-125 MG tablet Take 1 tablet by mouth 2 (two) times daily. 07/06/18   Deno Etienne, DO  cyclobenzaprine (FLEXERIL) 5 MG tablet Take 1 tablet (5 mg total) by mouth 3 (three) times daily as needed for muscle spasms. 05/24/18   Meredith Staggers, MD  metoCLOPramide (REGLAN) 5 MG tablet Take 1 tablet (5 mg total) by mouth every 6 (six) hours as needed for nausea or vomiting. 06/19/18   Stark Klein, MD  morphine (MSIR) 15 MG tablet Take 1 tablet (15 mg total) by mouth every 4 (four) hours as needed for severe pain. 07/06/18   Deno Etienne, DO  ondansetron (ZOFRAN) 8 MG tablet Take 1 tablet (8 mg total) by mouth every 8 (eight) hours as needed for nausea or vomiting. 07/06/18   Deno Etienne, DO  pantoprazole (PROTONIX) 40 MG tablet Take 1 tablet (40 mg total) by mouth daily. 06/19/18   Stark Klein, MD  potassium chloride SA (K-DUR) 20 MEQ tablet Take 1 tablet (20 mEq total) by mouth daily. 05/21/18   Love, Ivan Anchors, PA-C  promethazine (PHENERGAN) 25 MG tablet Take 1 tablet (25 mg total) by mouth every 6 (six) hours as needed for nausea or vomiting. 06/19/18   Stark Klein, MD    Family History Family History  Problem Relation Age of Onset   Diabetes Mother    Hypertension Mother    COPD Mother    Uterine cancer Mother 53       had hysterectomy   Diabetes Sister     Hypertension Sister    Lung cancer Maternal Grandmother        lung cancer   Hypertension Sister     Social History Social History   Tobacco Use   Smoking status: Former Smoker    Packs/day: 1.00    Years: 37.00    Pack years: 37.00    Types: Cigarettes    Quit date: 04/25/2018    Years since quitting: 0.2   Smokeless tobacco: Never Used  Substance Use Topics   Alcohol use: Not Currently   Drug use: Never     Allergies   Latex   Review of Systems Review of Systems  Gastrointestinal: Positive for vomiting.  All other systems reviewed and are negative.    Physical Exam Updated Vital Signs BP 133/86 (BP Location: Right Arm)    Pulse 92    Temp 98.4 F (36.9 C) (  Oral)    Resp 16    SpO2 100%   Physical Exam Vitals signs and nursing note reviewed.  Constitutional:      General: She is not in acute distress.    Appearance: She is well-developed.     Comments: Appears uncomfortable but nontoxic.  Holding a emesis bag  HENT:     Head: Atraumatic.  Eyes:     Conjunctiva/sclera: Conjunctivae normal.  Neck:     Musculoskeletal: Neck supple.  Cardiovascular:     Rate and Rhythm: Normal rate and regular rhythm.     Pulses: Normal pulses.     Heart sounds: Normal heart sounds.  Pulmonary:     Effort: Pulmonary effort is normal.     Breath sounds: Normal breath sounds.  Abdominal:     Palpations: Abdomen is soft.     Tenderness: There is abdominal tenderness (Tenderness to left lower quadrant and left upper quadrant on palpation no guarding or rebound tenderness).  Skin:    Findings: No rash.  Neurological:     Mental Status: She is alert.      ED Treatments / Results  Labs (all labs ordered are listed, but only abnormal results are displayed) Labs Reviewed  CBC WITH DIFFERENTIAL/PLATELET - Abnormal; Notable for the following components:      Result Value   Hemoglobin 11.1 (*)    HCT 34.0 (*)    MCV 67.9 (*)    MCH 22.2 (*)    RDW 20.4 (*)    All  other components within normal limits  COMPREHENSIVE METABOLIC PANEL - Abnormal; Notable for the following components:   Potassium 3.4 (*)    BUN <5 (*)    Calcium 8.6 (*)    Total Protein 6.3 (*)    Albumin 3.2 (*)    AST 43 (*)    All other components within normal limits  LIPASE, BLOOD  URINALYSIS, ROUTINE W REFLEX MICROSCOPIC    EKG None  Radiology Ct Abdomen Pelvis W Contrast  Result Date: 07/06/2018 CLINICAL DATA:  48 y/o F; history of pancreatic cancer, ERCP, biliary stent placement. History of Whipple procedure 04/25/2018 with ongoing abdominal pain. EXAM: CT ABDOMEN AND PELVIS WITH CONTRAST TECHNIQUE: Multidetector CT imaging of the abdomen and pelvis was performed using the standard protocol following bolus administration of intravenous contrast. CONTRAST:  55m OMNIPAQUE IOHEXOL 300 MG/ML  SOLN COMPARISON:  06/15/2018 CT abdomen and pelvis. 05/03/2018 MRI of the abdomen. FINDINGS: Lower chest: No acute abnormality. Hepatobiliary: Diffuse hepatic steatosis. Stable heterogeneous non masslike enhancement within right lobe of liver in a similar pattern to the prior MRI given differences in technique. Mild pneumobilia. No biliary ductal dilatation. No obstructive changes of the hepatobiliary limb. Pancreas: Status post Whipple procedure. No recurrent mass identified. Normal appearance of residual pancreatic tail. Spleen: Normal in size without focal abnormality. Adrenals/Urinary Tract: Adrenal glands are unremarkable. Kidneys are normal, without renal calculi, focal lesion, or hydronephrosis. Bladder is unremarkable. Stomach/Bowel: Diffuse low-attenuation wall thickening of the colon as well as the stomach with mucosal enhancement. No obstructive or inflammatory changes of the small bowel. Vascular/Lymphatic: Aortic atherosclerosis. No enlarged abdominal or pelvic lymph nodes. Reproductive: Uterus and bilateral adnexa are unremarkable. Other: Small volume of ascites. Musculoskeletal: No  fracture is seen. IMPRESSION: 1. Progressive diffuse wall thickening and mucosal enhancement of the colon compatible with colitis. New small volume of ascites. 2. New wall thickening and mucosal enhancement of the stomach, possibly concurrent colitis. 3. Status post Whipple procedure. No recurrent mass  identified. 4. Diffuse hepatic steatosis. 5. Grossly stable heterogeneous non masslike enhancement within right lobe of liver from prior MRI where it is better characterized. 6.  Aortic Atherosclerosis (ICD10-I70.0). Electronically Signed   By: Kristine Garbe M.D.   On: 07/06/2018 05:51    Procedures Procedures (including critical care time)  Medications Ordered in ED Medications  sodium chloride 0.9 % bolus 1,000 mL (0 mLs Intravenous Stopped 07/07/18 1647)  ondansetron (ZOFRAN) injection 4 mg (4 mg Intravenous Given 07/07/18 1507)  HYDROmorphone (DILAUDID) injection 1 mg (1 mg Intravenous Given 07/07/18 1507)  piperacillin-tazobactam (ZOSYN) IVPB 3.375 g (0 g Intravenous Stopped 07/07/18 1534)  HYDROmorphone (DILAUDID) injection 1 mg (1 mg Intravenous Given 07/07/18 1606)     Initial Impression / Assessment and Plan / ED Course  I have reviewed the triage vital signs and the nursing notes.  Pertinent labs & imaging results that were available during my care of the patient were reviewed by me and considered in my medical decision making (see chart for details).        BP 133/86 (BP Location: Right Arm)    Pulse 92    Temp 98.4 F (36.9 C) (Oral)    Resp 16    SpO2 100%    Final Clinical Impressions(s) / ED Diagnoses   Final diagnoses:  Colitis    ED Discharge Orders         Ordered    oxyCODONE-acetaminophen (PERCOCET) 5-325 MG tablet  Every 6 hours PRN     07/07/18 1655         2:21 PM Patient was diagnosed with colitis on CT scan yesterday.  This is her third ER visit due to abdominal pain and unable to keep anything down.  She was seen early this morning for same  but have to leave to care for her pets at home.  She did try to eat and drink without any success.  She does not have a rigid abdomen.  She is afebrile.  We will continue with symptomatic treatment however anticipate admission for further management of her colitis.  Will need she antibiotic, antinausea medication, and pain medication.  IV fluid given.  4:53 PM Labs are reassuring.  Patient received Zosyn antibiotic as well as IV fluid and and pain medication and antinausea medication here.  She felt better.  She is now tolerates p.o. and felt comfortable enough to go home.  I will prescribe a short course of pain medication to use as needed.  Encourage patient to continue taking antibiotics as previously prescribed.  Return precaution discussed.   Domenic Moras, PA-C 07/07/18 1655    Charlesetta Shanks, MD 07/08/18 [phone]

## 2018-07-07 NOTE — ED Triage Notes (Signed)
Pt. Stated, I was just discharge from ER this morning and still having symptoms N/V/D. Unable to keep anything down

## 2018-07-07 NOTE — ED Provider Notes (Signed)
[facility] EMERGENCY DEPARTMENT Provider Note   CSN: [id number] Arrival date & time: 07/06/18  2149     History   Chief Complaint Chief Complaint  Patient presents with   Emesis    HPI Hermione Havlicek is a 48 y.o. female.     The history is provided by the patient.  Emesis Severity:  Severe Timing:  Intermittent Progression:  Worsening Chronicity:  Recurrent Relieved by:  Nothing Worsened by:  Nothing Associated symptoms: abdominal pain and diarrhea   Associated symptoms: no fever   Risk factors: prior abdominal surgery   Patient presents for nausea/vomiting/diarrhea for the past several days.  It is nonbloody.  No fevers.  She reports diffuse abdominal pain.  She have a previous history of a Whipple procedure.  She has had pain on and off since that time She was seen in the ER on June 20, diagnosed with colitis, sent home on antibiotics patient is unable to keep those down.  Past Medical History:  Diagnosis Date   Family history of lung cancer    Family history of prostate cancer    Family history of uterine cancer    Gallstones 10/2017   Pancreatic cancer ([place])    Pneumothorax, closed, traumatic    years ago   PONV (postoperative nausea and vomiting)     Patient Active Problem List   Diagnosis Date Noted   Hospital discharge follow-up 06/29/2018   Nausea & vomiting 06/29/2018   Colitis 06/15/2018   Hypokalemia 05/22/2018   Peripheral edema    Prediabetes    Acute blood loss anemia    Thrombocytopenia (HCC)    Urinary retention    Sinus tachycardia    Debility 05/16/2018   Acute respiratory failure with hypoxemia ([place]) 04/30/2018   HCAP (healthcare-associated pneumonia) 04/30/2018   Essential hypertension 04/30/2018   Chronic prescription opiate use 04/30/2018   Postoperative pain 04/30/2018   Tachycardia 04/30/2018   Tachypnea 04/30/2018   Acute respiratory alkalosis 04/30/2018   Adenocarcinoma of head  of pancreas ([place]) 04/25/2018   Genetic testing 01/24/2018   Family history of uterine cancer    Family history of prostate cancer    Family history of lung cancer    Pancreatic cancer ([place]) 11/09/2017   Pancreatic mass    Cholecystitis 10/30/2017   Jaundice 10/30/2017    Past Surgical History:  Procedure Laterality Date   BILIARY STENT PLACEMENT  10/31/2017   Procedure: BILIARY STENT PLACEMENT;  Surgeon: [name], MD;  Location: [place];  Service: Gastroenterology;;   ERCP N/A 10/31/2017   Procedure: ENDOSCOPIC RETROGRADE CHOLANGIOPANCREATOGRAPHY (ERCP);  Surgeon: [name], MD;  Location[facility] ENDOSCOPY;  Service: Gastroenterology;  Laterality: N/A;   ESOPHAGOGASTRODUODENOSCOPY N/A 11/08/2017   Procedure: ESOPHAGOGASTRODUODENOSCOPY (EGD);  Surgeon: Milus Banister, MD;  Location: [facility] ENDOSCOPY;  Service: Endoscopy;  Laterality: N/A;   EUS N/A 11/08/2017   Procedure: UPPER ENDOSCOPIC ULTRASOUND (EUS) RADIAL;  Surgeon: Milus Banister, MD;  Location: [facility] ENDOSCOPY;  Service: Endoscopy;  Laterality: N/A;   FINE NEEDLE ASPIRATION  11/08/2017   Procedure: FINE NEEDLE ASPIRATION;  Surgeon: Milus Banister, MD;  Location: [facility] ENDOSCOPY;  Service: Endoscopy;;   IR IMAGING GUIDED PORT INSERTION  11/15/2017   LAPAROSCOPY N/A 04/25/2018   Procedure: LAPAROSCOPY DIAGNOSTIC;  Surgeon: Stark Klein, MD;  Location: [place];  Service: General;  Laterality: N/A;  GENERAL AND EPIDURAL ANESTHESIA   SPHINCTEROTOMY  10/31/2017   Procedure: SPHINCTEROTOMY;  Surgeon: [name], MD;  Location: [place];  Service:  Gastroenterology;;   TONSILLECTOMY     WHIPPLE PROCEDURE N/A 04/25/2018   Procedure: WHIPPLE PROCEDURE, RECONSTRUCTION OF PORTAL VEIN;  Surgeon: Stark Klein, MD;  Location: [place];  Service: General;  Laterality: N/A;     OB History   No obstetric history on file.      Home Medications    Prior to Admission medications   Medication Sig Start Date End Date  Taking? Authorizing Provider  acetaminophen (TYLENOL) 325 MG tablet Take 1-2 tablets (325-650 mg total) by mouth every 4 (four) hours as needed for mild pain. 05/21/18   Love, Ivan Anchors, PA-C  amoxicillin-clavulanate (AUGMENTIN) 875-125 MG tablet Take 1 tablet by mouth 2 (two) times daily. 07/06/18   Deno Etienne, DO  cyclobenzaprine (FLEXERIL) 5 MG tablet Take 1 tablet (5 mg total) by mouth 3 (three) times daily as needed for muscle spasms. 05/24/18   Meredith Staggers, MD  metoCLOPramide (REGLAN) 5 MG tablet Take 1 tablet (5 mg total) by mouth every 6 (six) hours as needed for nausea or vomiting. 06/19/18   Stark Klein, MD  morphine (MSIR) 15 MG tablet Take 1 tablet (15 mg total) by mouth every 4 (four) hours as needed for severe pain. 07/06/18   Deno Etienne, DO  ondansetron (ZOFRAN) 8 MG tablet Take 1 tablet (8 mg total) by mouth every 8 (eight) hours as needed for nausea or vomiting. 07/06/18   Deno Etienne, DO  pantoprazole (PROTONIX) 40 MG tablet Take 1 tablet (40 mg total) by mouth daily. 06/19/18   Stark Klein, MD  potassium chloride SA (K-DUR) 20 MEQ tablet Take 1 tablet (20 mEq total) by mouth daily. 05/21/18   Love, Ivan Anchors, PA-C  promethazine (PHENERGAN) 25 MG tablet Take 1 tablet (25 mg total) by mouth every 6 (six) hours as needed for nausea or vomiting. 06/19/18   Stark Klein, MD    Family History Family History  Problem Relation Age of Onset   Diabetes Mother    Hypertension Mother    COPD Mother    Uterine cancer Mother 54       had hysterectomy   Diabetes Sister    Hypertension Sister    Lung cancer Maternal Grandmother        lung cancer   Hypertension Sister     Social History Social History   Tobacco Use   Smoking status: Former Smoker    Packs/day: 1.00    Years: 37.00    Pack years: 37.00    Types: Cigarettes    Quit date: 04/25/2018    Years since quitting: 0.2   Smokeless tobacco: Never Used  Substance Use Topics   Alcohol use: Not Currently   Drug use:  Never     Allergies   Latex   Review of Systems Review of Systems  Constitutional: Negative for fever.  Cardiovascular: Negative for chest pain.  Gastrointestinal: Positive for abdominal pain, diarrhea and vomiting. Negative for blood in stool.  All other systems reviewed and are negative.    Physical Exam Updated Vital Signs BP (!) 183/87    Pulse 73    Temp 98.1 F (36.7 C) (Oral)    Resp 18    Ht 1.473 m (4' 10" )    Wt 45.4 kg    SpO2 99%    BMI 20.92 kg/m   Physical Exam  CONSTITUTIONAL: Well developed/well nourished, uncomfortable appearing HEAD: Normocephalic/atraumatic EYES: EOMI/PERRL, no icterus ENMT: Mucous membranes moist NECK: supple no meningeal signs SPINE/BACK:entire spine nontender CV: S1/S2  noted, no murmurs/rubs/gallops noted LUNGS: Lungs are clear to auscultation bilaterally, no apparent distress ABDOMEN: soft, mild diffuse tenderness, no rebound or guarding, bowel sounds noted throughout abdomen No rigidity.  Well-healed abdominal scar noted GU:no cva tenderness NEURO: Pt is awake/alert/appropriate, moves all extremitiesx4.  No facial droop.   EXTREMITIES: pulses normal/equal, full ROM SKIN: warm, color normal PSYCH: no abnormalities of mood noted, alert and oriented to situation  ED Treatments / Results  Labs (all labs ordered are listed, but only abnormal results are displayed) Labs Reviewed  COMPREHENSIVE METABOLIC PANEL - Abnormal; Notable for the following components:      Result Value   Potassium 3.3 (*)    Glucose, Bld 140 (*)    BUN <5 (*)    Calcium 8.7 (*)    Albumin 3.1 (*)    All other components within normal limits  CBC - Abnormal; Notable for the following components:   Hemoglobin 11.0 (*)    HCT 34.0 (*)    MCV 68.1 (*)    MCH 22.0 (*)    RDW 20.7 (*)    All other components within normal limits  URINALYSIS, ROUTINE W REFLEX MICROSCOPIC - Abnormal; Notable for the following components:   APPearance CLOUDY (*)    All  other components within normal limits  LIPASE, BLOOD  I-STAT BETA HCG BLOOD, ED ([facility], [facility], [facility] ONLY)    EKG    Radiology Ct Abdomen Pelvis W Contrast  Result Date: 07/06/2018 CLINICAL DATA:  48 y/o F; history of pancreatic cancer, ERCP, biliary stent placement. History of Whipple procedure 04/25/2018 with ongoing abdominal pain. EXAM: CT ABDOMEN AND PELVIS WITH CONTRAST TECHNIQUE: Multidetector CT imaging of the abdomen and pelvis was performed using the standard protocol following bolus administration of intravenous contrast. CONTRAST:  64m OMNIPAQUE IOHEXOL 300 MG/ML  SOLN COMPARISON:  06/15/2018 CT abdomen and pelvis. 05/03/2018 MRI of the abdomen. FINDINGS: Lower chest: No acute abnormality. Hepatobiliary: Diffuse hepatic steatosis. Stable heterogeneous non masslike enhancement within right lobe of liver in a similar pattern to the prior MRI given differences in technique. Mild pneumobilia. No biliary ductal dilatation. No obstructive changes of the hepatobiliary limb. Pancreas: Status post Whipple procedure. No recurrent mass identified. Normal appearance of residual pancreatic tail. Spleen: Normal in size without focal abnormality. Adrenals/Urinary Tract: Adrenal glands are unremarkable. Kidneys are normal, without renal calculi, focal lesion, or hydronephrosis. Bladder is unremarkable. Stomach/Bowel: Diffuse low-attenuation wall thickening of the colon as well as the stomach with mucosal enhancement. No obstructive or inflammatory changes of the small bowel. Vascular/Lymphatic: Aortic atherosclerosis. No enlarged abdominal or pelvic lymph nodes. Reproductive: Uterus and bilateral adnexa are unremarkable. Other: Small volume of ascites. Musculoskeletal: No fracture is seen. IMPRESSION: 1. Progressive diffuse wall thickening and mucosal enhancement of the colon compatible with colitis. New small volume of ascites. 2. New wall thickening and mucosal enhancement of the stomach, possibly concurrent  colitis. 3. Status post Whipple procedure. No recurrent mass identified. 4. Diffuse hepatic steatosis. 5. Grossly stable heterogeneous non masslike enhancement within right lobe of liver from prior MRI where it is better characterized. 6.  Aortic Atherosclerosis (ICD10-I70.0). Electronically Signed   By: LKristine GarbeM.D.   On: 07/06/2018 05:51    Procedures Procedures   Medications Ordered in ED Medications  sodium chloride flush (NS) 0.9 % injection 3 mL (3 mLs Intravenous Given 07/07/18 0313)  ondansetron (ZOFRAN-ODT) disintegrating tablet 4 mg (4 mg Oral Given 07/06/18 2223)  sodium chloride 0.9 % bolus 1,000 mL (0 mLs  Intravenous Stopped 07/07/18 0456)  ondansetron (ZOFRAN) injection 4 mg (4 mg Intravenous Given 07/07/18 0313)  HYDROmorphone (DILAUDID) injection 1 mg (1 mg Intravenous Given 07/07/18 0313)  HYDROmorphone (DILAUDID) injection 1 mg (1 mg Intravenous Given 07/07/18 0457)  ondansetron (ZOFRAN) injection 4 mg (4 mg Intravenous Given 07/07/18 0457)     Initial Impression / Assessment and Plan / ED Course  I have reviewed the triage vital signs and the nursing notes.  Pertinent labs results that were available during my care of the patient were reviewed by me and considered in my medical decision making (see chart for details).        3:47 AM Patient presents for continued nausea vomiting diarrhea abdominal pain.  She was just seen in the ER on June 20 had a CT scan that showed colitis.  No complications from the Whipple She reports unable to take her medicines at home.  She would like to try IV medicines and if improved she would like to go home.  Labs are largely unchanged.  Vital signs are appropriate.  Will treat pain and reassess 5:40 AM Patient is improved, taking p.o. fluids.  Vitals are appropriate. She is asking for Dilaudid home meds/tablets at discharge, advised she should just take her home medicines. She should  continue her home antibiotics. Final  Clinical Impressions(s) / ED Diagnoses   Final diagnoses:  Nausea vomiting and diarrhea  Generalized abdominal pain    ED Discharge Orders    None       Ripley Fraise, MD 07/07/18 [phone]

## 2018-07-08 ENCOUNTER — Encounter: Payer: Self-pay | Admitting: Hematology

## 2018-07-08 ENCOUNTER — Inpatient Hospital Stay: Payer: Medicaid Other

## 2018-07-08 ENCOUNTER — Telehealth: Payer: Self-pay | Admitting: Hematology

## 2018-07-08 ENCOUNTER — Inpatient Hospital Stay (HOSPITAL_BASED_OUTPATIENT_CLINIC_OR_DEPARTMENT_OTHER): Payer: Medicaid Other | Admitting: Hematology

## 2018-07-08 ENCOUNTER — Other Ambulatory Visit: Payer: Self-pay

## 2018-07-08 ENCOUNTER — Telehealth: Payer: Self-pay

## 2018-07-08 ENCOUNTER — Inpatient Hospital Stay: Payer: Medicaid Other | Attending: Hematology

## 2018-07-08 VITALS — BP 122/85 | HR 73 | Temp 98.2°F | Resp 18 | Ht <= 58 in | Wt 109.4 lb

## 2018-07-08 DIAGNOSIS — Z79899 Other long term (current) drug therapy: Secondary | ICD-10-CM

## 2018-07-08 DIAGNOSIS — R918 Other nonspecific abnormal finding of lung field: Secondary | ICD-10-CM

## 2018-07-08 DIAGNOSIS — K529 Noninfective gastroenteritis and colitis, unspecified: Secondary | ICD-10-CM | POA: Insufficient documentation

## 2018-07-08 DIAGNOSIS — Z801 Family history of malignant neoplasm of trachea, bronchus and lung: Secondary | ICD-10-CM | POA: Diagnosis not present

## 2018-07-08 DIAGNOSIS — R55 Syncope and collapse: Secondary | ICD-10-CM | POA: Diagnosis not present

## 2018-07-08 DIAGNOSIS — Z9221 Personal history of antineoplastic chemotherapy: Secondary | ICD-10-CM

## 2018-07-08 DIAGNOSIS — F17210 Nicotine dependence, cigarettes, uncomplicated: Secondary | ICD-10-CM | POA: Insufficient documentation

## 2018-07-08 DIAGNOSIS — C257 Malignant neoplasm of other parts of pancreas: Secondary | ICD-10-CM | POA: Diagnosis not present

## 2018-07-08 DIAGNOSIS — R591 Generalized enlarged lymph nodes: Secondary | ICD-10-CM | POA: Diagnosis not present

## 2018-07-08 DIAGNOSIS — D6481 Anemia due to antineoplastic chemotherapy: Secondary | ICD-10-CM | POA: Diagnosis not present

## 2018-07-08 DIAGNOSIS — D509 Iron deficiency anemia, unspecified: Secondary | ICD-10-CM | POA: Diagnosis not present

## 2018-07-08 DIAGNOSIS — I10 Essential (primary) hypertension: Secondary | ICD-10-CM

## 2018-07-08 DIAGNOSIS — Z90411 Acquired partial absence of pancreas: Secondary | ICD-10-CM | POA: Diagnosis not present

## 2018-07-08 DIAGNOSIS — I700 Atherosclerosis of aorta: Secondary | ICD-10-CM | POA: Insufficient documentation

## 2018-07-08 DIAGNOSIS — Z8041 Family history of malignant neoplasm of ovary: Secondary | ICD-10-CM

## 2018-07-08 DIAGNOSIS — K760 Fatty (change of) liver, not elsewhere classified: Secondary | ICD-10-CM | POA: Diagnosis not present

## 2018-07-08 DIAGNOSIS — C250 Malignant neoplasm of head of pancreas: Secondary | ICD-10-CM

## 2018-07-08 DIAGNOSIS — D563 Thalassemia minor: Secondary | ICD-10-CM

## 2018-07-08 LAB — CMP (CANCER CENTER ONLY)
ALT: 29 U/L (ref 0–44)
AST: 51 U/L — ABNORMAL HIGH (ref 15–41)
Albumin: 2.7 g/dL — ABNORMAL LOW (ref 3.5–5.0)
Alkaline Phosphatase: 93 U/L (ref 38–126)
Anion gap: 10 (ref 5–15)
BUN: <4 mg/dL — ABNORMAL LOW (ref 6–20)
CO2: 24 mmol/L (ref 22–32)
Calcium: 7.6 mg/dL — ABNORMAL LOW (ref 8.9–10.3)
Chloride: 103 mmol/L (ref 98–111)
Creatinine: 0.6 mg/dL (ref 0.44–1.00)
GFR, Est AFR Am: >60 mL/min (ref >60)
GFR, Estimated: >60 mL/min (ref >60)
Glucose, Bld: 101 mg/dL — ABNORMAL HIGH (ref 70–99)
Potassium: 3 mmol/L — CL (ref 3.5–5.1)
Sodium: 137 mmol/L (ref 135–145)
Total Bilirubin: 0.7 mg/dL (ref 0.3–1.2)
Total Protein: 5.9 g/dL — ABNORMAL LOW (ref 6.5–8.1)

## 2018-07-08 LAB — CBC WITH DIFFERENTIAL (CANCER CENTER ONLY)
Abs Immature Granulocytes: 0.02 10*3/uL (ref 0.00–0.07)
Basophils Absolute: 0 10*3/uL (ref 0.0–0.1)
Basophils Relative: 0 %
Eosinophils Absolute: 0.1 10*3/uL (ref 0.0–0.5)
Eosinophils Relative: 1 %
HCT: 29.9 % — ABNORMAL LOW (ref 36.0–46.0)
Hemoglobin: 9.9 g/dL — ABNORMAL LOW (ref 12.0–15.0)
Immature Granulocytes: 0 %
Lymphocytes Relative: 16 %
Lymphs Abs: 1.4 10*3/uL (ref 0.7–4.0)
MCH: 22.2 pg — ABNORMAL LOW (ref 26.0–34.0)
MCHC: 33.1 g/dL (ref 30.0–36.0)
MCV: 67.2 fL — ABNORMAL LOW (ref 80.0–100.0)
Monocytes Absolute: 0.4 10*3/uL (ref 0.1–1.0)
Monocytes Relative: 5 %
Neutro Abs: 6.5 10*3/uL (ref 1.7–7.7)
Neutrophils Relative %: 78 %
Platelet Count: 200 10*3/uL (ref 150–400)
RBC: 4.45 MIL/uL (ref 3.87–5.11)
RDW: 20 % — ABNORMAL HIGH (ref 11.5–15.5)
WBC Count: 8.4 10*3/uL (ref 4.0–10.5)
nRBC: 0 % (ref 0.0–0.2)

## 2018-07-08 MED ORDER — HEPARIN SOD (PORK) LOCK FLUSH 100 UNIT/ML IV SOLN
500.0000 [IU] | Freq: Once | INTRAVENOUS | Status: AC | PRN
  Administered 2018-07-08: 500 [IU]
  Filled 2018-07-08: qty 5

## 2018-07-08 MED ORDER — MORPHINE SULFATE ER 15 MG PO TBCR: 15.0000 mg | EXTENDED_RELEASE_TABLET | Freq: Two times a day (BID) | ORAL | 0 refills | Status: DC

## 2018-07-08 MED ORDER — SODIUM CHLORIDE 0.9% FLUSH
10.0000 mL | INTRAVENOUS | Status: DC | PRN
  Administered 2018-07-08: 10 mL
  Filled 2018-07-08: qty 10

## 2018-07-08 MED ORDER — MORPHINE SULFATE 15 MG PO TABS: 15.0000 mg | ORAL_TABLET | ORAL | 0 refills | Status: DC | PRN

## 2018-07-08 MED ORDER — CYCLOBENZAPRINE HCL 5 MG PO TABS: 5.0000 mg | ORAL_TABLET | Freq: Three times a day (TID) | ORAL | 1 refills | Status: DC | PRN

## 2018-07-08 MED FILL — MORPHINE SULFATE IR 15 MG T: 15 | 10 days supply | Qty: 60 | Fill #0

## 2018-07-08 MED FILL — MORPHINE SULF ER 15 MG TAB: 15 | 30 days supply | Qty: 60 | Fill #0

## 2018-07-08 MED FILL — CYCLOBENZAPRINE HCL 5 MG TA: 5 | 20 days supply | Qty: 60 | Fill #0

## 2018-07-08 NOTE — Telephone Encounter (Signed)
I talk with patient regarding schedule  

## 2018-07-08 NOTE — Telephone Encounter (Signed)
appt made for 7/28 in person appt with Dr Ardis Hughs and pt was advised

## 2018-07-08 NOTE — Telephone Encounter (Signed)
-----   Message from Milus Banister, MD sent at 07/08/2018  1:04 PM EDT ----- I forgot to send this to you just now.  ----- Message ----- From: Milus Banister, MD Sent: 07/08/2018   1:02 PM EDT To: Stark Klein, MD, Jerene Bears, MD, #  Krista Blue, Happy to help. Send me the PET result when you see it.  Thanks   Teagyn Fishel,  She needs ROV with me, first available.  Telemed or in person.    DJ  ----- Message ----- From: Truitt Merle, MD Sent: 07/08/2018   9:50 AM EDT To: Milus Banister, MD, Stark Klein, MD  Linna Hoff,  She has neoadjuvant chemo and Whipple surgery for pancreatic cancer. Since her surgery, she has had persistent N/V, diarrhea, abdominal pain and weight loss, had several ED visit and hospital stay. Her CT showed colitis and gastritis, no sure why, no evidence of cancer recurrence on CT. Could you see her back to see if she needs colonoscopy, and possbile EGD? Cancer recurrence is certainly high on differential, I ordered a PET scan today.   Dorris Fetch, I will arrange IVF twice a week, and see me and my NP weekly until we figure this our, I will manage her pain meds also.  Thanks  Krista Blue

## 2018-07-09 ENCOUNTER — Inpatient Hospital Stay: Payer: Medicaid Other

## 2018-07-09 ENCOUNTER — Telehealth: Payer: Self-pay | Admitting: Hematology

## 2018-07-09 ENCOUNTER — Other Ambulatory Visit: Payer: Self-pay

## 2018-07-09 VITALS — BP 108/72 | HR 81 | Temp 98.2°F | Resp 18

## 2018-07-09 DIAGNOSIS — C250 Malignant neoplasm of head of pancreas: Secondary | ICD-10-CM

## 2018-07-09 DIAGNOSIS — C257 Malignant neoplasm of other parts of pancreas: Secondary | ICD-10-CM | POA: Diagnosis not present

## 2018-07-09 LAB — CANCER ANTIGEN 19-9: CA 19-9: 16 U/mL (ref 0–35)

## 2018-07-09 MED ORDER — HEPARIN SOD (PORK) LOCK FLUSH 100 UNIT/ML IV SOLN
500.0000 [IU] | Freq: Once | INTRAVENOUS | Status: AC | PRN
  Administered 2018-07-09: 500 [IU]
  Filled 2018-07-09: qty 5

## 2018-07-09 MED ORDER — SODIUM CHLORIDE 0.9% FLUSH
10.0000 mL | Freq: Once | INTRAVENOUS | Status: AC | PRN
  Administered 2018-07-09: 10 mL
  Filled 2018-07-09: qty 10

## 2018-07-09 MED ORDER — SODIUM CHLORIDE 0.9 % IV SOLN
Freq: Once | INTRAVENOUS | Status: AC
  Administered 2018-07-09: 09:00:00 via INTRAVENOUS
  Filled 2018-07-09: qty 250

## 2018-07-09 NOTE — Telephone Encounter (Signed)
Scheduled appt per 6/22 los. Spoke with patient and she is aware of her appt date and time.

## 2018-07-09 NOTE — Patient Instructions (Signed)

## 2018-07-12 ENCOUNTER — Ambulatory Visit (HOSPITAL_COMMUNITY): Payer: Medicaid Other

## 2018-07-12 ENCOUNTER — Inpatient Hospital Stay (HOSPITAL_BASED_OUTPATIENT_CLINIC_OR_DEPARTMENT_OTHER): Payer: Medicaid Other | Admitting: Medical

## 2018-07-12 ENCOUNTER — Other Ambulatory Visit: Payer: Self-pay

## 2018-07-12 ENCOUNTER — Telehealth: Payer: Self-pay | Admitting: Medical

## 2018-07-12 ENCOUNTER — Inpatient Hospital Stay: Payer: Medicaid Other

## 2018-07-12 VITALS — BP 117/89 | HR 98 | Temp 98.8°F | Resp 18 | Ht <= 58 in | Wt 108.1 lb

## 2018-07-12 DIAGNOSIS — Z79899 Other long term (current) drug therapy: Secondary | ICD-10-CM

## 2018-07-12 DIAGNOSIS — C257 Malignant neoplasm of other parts of pancreas: Secondary | ICD-10-CM

## 2018-07-12 DIAGNOSIS — K529 Noninfective gastroenteritis and colitis, unspecified: Secondary | ICD-10-CM

## 2018-07-12 DIAGNOSIS — R591 Generalized enlarged lymph nodes: Secondary | ICD-10-CM

## 2018-07-12 DIAGNOSIS — R918 Other nonspecific abnormal finding of lung field: Secondary | ICD-10-CM

## 2018-07-12 DIAGNOSIS — I700 Atherosclerosis of aorta: Secondary | ICD-10-CM

## 2018-07-12 DIAGNOSIS — D509 Iron deficiency anemia, unspecified: Secondary | ICD-10-CM

## 2018-07-12 DIAGNOSIS — Z801 Family history of malignant neoplasm of trachea, bronchus and lung: Secondary | ICD-10-CM

## 2018-07-12 DIAGNOSIS — K760 Fatty (change of) liver, not elsewhere classified: Secondary | ICD-10-CM

## 2018-07-12 DIAGNOSIS — Z90411 Acquired partial absence of pancreas: Secondary | ICD-10-CM

## 2018-07-12 DIAGNOSIS — R55 Syncope and collapse: Secondary | ICD-10-CM

## 2018-07-12 DIAGNOSIS — F17210 Nicotine dependence, cigarettes, uncomplicated: Secondary | ICD-10-CM

## 2018-07-12 DIAGNOSIS — Z8041 Family history of malignant neoplasm of ovary: Secondary | ICD-10-CM

## 2018-07-12 DIAGNOSIS — C250 Malignant neoplasm of head of pancreas: Secondary | ICD-10-CM

## 2018-07-12 DIAGNOSIS — Z9221 Personal history of antineoplastic chemotherapy: Secondary | ICD-10-CM

## 2018-07-12 DIAGNOSIS — D6481 Anemia due to antineoplastic chemotherapy: Secondary | ICD-10-CM

## 2018-07-12 DIAGNOSIS — D563 Thalassemia minor: Secondary | ICD-10-CM

## 2018-07-12 NOTE — Progress Notes (Signed)
[facility] Progress Note   Jane Brooks [id number] [birth year] 48 y.o.  Mallika Sanmiguel is managed by Dr. Truitt Merle  Actively treated with chemotherapy/immunotherapy/hormonal therapy: no  Next scheduled appointment with provider: 07/16/2018  Assessment: Plan:    Adenocarcinoma of head of pancreas ([place])   Adenocarcinoma of the head of the pancreas: The patient is status post a Whipple's procedure.  She was scheduled to receive IV fluids today as she has been doing since her recent hospitalization.  She states that she does not want fluids today as she is eating and drinking better.  She is scheduled to see Dr. Morey Hummingbird on 07/16/2018 and to have a PET scan completed on 07/17/2018.  She has been scheduled for follow-up with Dr. Morey Hummingbird on 07/26/2018 for review of her PET scan.  She does not believe that she needs to be seen next week for IV fluids since she is feeling significantly better.  Please see After Visit Summary for patient specific instructions.  Future Appointments  Date Time Provider [place]  07/16/2018  1:30 PM [facility] LAB 2 [facility] None  07/16/2018  1:45 PM [facility] [place] FLUSH [facility] None  07/16/2018  2:15 PM Truitt Merle, MD [facility] None  07/16/2018  3:30 PM [facility] NURSE [facility] None  07/17/2018 10:00 AM [facility] PET CT 1 [facility] [place]  07/17/2018  1:40 PM Meredith Staggers, MD [facility] CPR  07/19/2018  8:00 AM [facility] INFUSION [facility] None  07/23/2018  2:30 PM [facility] INFUSION [facility] None  07/26/2018  1:45 PM [facility] LAB 6 [facility] None  07/26/2018  2:00 PM [facility] [place] FLUSH [facility] None  07/26/2018  2:30 PM Truitt Merle, MD [facility] None  07/26/2018  3:15 PM [facility] INFUSION [facility] None  08/13/2018  9:00 AM Milus Banister, MD [facility]    No orders of the defined types were placed in this encounter.      Subjective:   Patient ID:  Jane Brooks is a 48 y.o. (DOB 23-Jul-1970)  female.  Chief Complaint: No chief complaint on file.   HPI Jane Brooks   is a 48 year old female with a history of an adenocarcinoma of the head of the pancreas.  She is followed by Dr. Burr Medico and presents to the office today for a previously scheduled appointment for IV fluids.  She states that she is eating and drinking better and does not believe that she needs IV fluids today.  She was scheduled to see Dr. Burr Medico on 07/16/2018 and was to receive additional IV fluids at that point.  She would like to cancel this appointment as she is eating and drinking better.  She is scheduled for a PET scan on 07/17/2018 and then has a follow-up appointment on 07/26/2018 for review of these results.  She denies fevers, chills, sweats, nausea, vomiting, constipation, or diarrhea.  Medications: I have reviewed the patient's current medications.  Allergies:  Allergies  Allergen Reactions   Latex Hives    Past Medical History:  Diagnosis Date   Family history of lung cancer    Family history of prostate cancer    Family history of uterine cancer    Gallstones 10/2017   Pancreatic cancer ([place])    Pneumothorax, closed, traumatic    years ago   PONV (postoperative nausea and vomiting)     Past Surgical History:  Procedure Laterality Date   BILIARY STENT PLACEMENT  10/31/2017   Procedure: BILIARY STENT PLACEMENT;  Surgeon: [name], MD;  Location: [place];  Service: Gastroenterology;;  ERCP N/A 10/31/2017   Procedure: ENDOSCOPIC RETROGRADE CHOLANGIOPANCREATOGRAPHY (ERCP);  Surgeon: [name], MD;  Location: [facility] ENDOSCOPY;  Service: Gastroenterology;  Laterality: N/A;   ESOPHAGOGASTRODUODENOSCOPY N/A 11/08/2017   Procedure: ESOPHAGOGASTRODUODENOSCOPY (EGD);  Surgeon: Milus Banister, MD;  Location: [facility];  Service: Endoscopy;  Laterality: N/A;   EUS N/A 11/08/2017   Procedure: UPPER ENDOSCOPIC ULTRASOUND (EUS) RADIAL;  Surgeon: Milus Banister, MD;  [facility];  Service: Endoscopy;  Laterality: N/A;   FINE NEEDLE ASPIRATION  11/08/2017   Procedure: FINE NEEDLE ASPIRATION;  Surgeon: Milus Banister, MD;  [facility];  Service: Endoscopy;;   IR IMAGING GUIDED PORT INSERTION  11/15/2017   LAPAROSCOPY N/A 04/25/2018   Procedure: LAPAROSCOPY DIAGNOSTIC;  Surgeon: Stark Klein, MD;  Location: [place];  Service: General;  Laterality: N/A;  GENERAL AND EPIDURAL ANESTHESIA   SPHINCTEROTOMY  10/31/2017   Procedure: SPHINCTEROTOMY;  Surgeon: [name], MD;  Location: [place];  Service: Gastroenterology;;   TONSILLECTOMY     WHIPPLE PROCEDURE N/A 04/25/2018   Procedure: WHIPPLE PROCEDURE, RECONSTRUCTION OF PORTAL VEIN;  Surgeon: Stark Klein, MD;  Location: [place];  Service: General;  Laterality: N/A;    Family History  Problem Relation Age of Onset   Diabetes Mother    Hypertension Mother    COPD Mother    Uterine cancer Mother 69       had hysterectomy   Diabetes Sister    Hypertension Sister    Lung cancer Maternal Grandmother        lung cancer   Hypertension Sister     Social History   Socioeconomic History   Marital status: Single    Spouse name: Not on file   Number of children: Not on file   Years of education: Not on file   Highest education level: Not on file  Occupational History   Occupation: unemployed  Social Designer, fashion/clothing strain: Not on file   Food insecurity    Worry: Not on file    Inability: Not on file   Transportation needs    Medical: Not on file    Non-medical: Not on file  Tobacco Use   Smoking status: Former Smoker    Packs/day: 1.00    Years: 37.00    Pack years: 37.00    Types: Cigarettes    Quit date: 04/25/2018    Years since quitting: 0.2   Smokeless tobacco: Never Used  Substance and Sexual Activity   Alcohol use: Not Currently   Drug use: Never   Sexual activity: Not Currently  Lifestyle   Physical activity    Days per week: Not  on file    Minutes per session: Not on file   Stress: Not on file  Relationships   Social connections    Talks on phone: Not on file    Gets together: Not on file    Attends religious service: Not on file    Active member of club or organization: Not on file    Attends meetings of clubs or organizations: Not on file    Relationship status: Not on file   Intimate partner violence    Fear of current or ex partner: Not on file    Emotionally abused: Not on file    Physically abused: Not on file    Forced sexual activity: Not on file  Other Topics Concern   Not on file  Social History Narrative   Not on file  Past Medical History, Surgical history, Social history, and Family history were reviewed and updated as appropriate.   Please see review of systems for further details on the patient's review from today.   Review of Systems:  Review of Systems  Constitutional: Negative for chills, diaphoresis and fever.  HENT: Negative for trouble swallowing and voice change.   Respiratory: Negative for cough, chest tightness, shortness of breath and wheezing.   Cardiovascular: Negative for chest pain and palpitations.  Gastrointestinal: Negative for abdominal pain, constipation, diarrhea, nausea and vomiting.  Musculoskeletal: Negative for back pain and myalgias.  Neurological: Negative for dizziness, light-headedness and headaches.    Objective:   Physical Exam:  BP 117/89 (BP Location: Right Arm, Patient Position: Sitting)    Pulse 98    Temp 98.8 F (37.1 C) (Temporal)    Resp 18    Ht 4' 10"  (1.473 m)    Wt 108 lb 1.6 oz (49 kg)    SpO2 100%    BMI 22.59 kg/m  ECOG: 1  Physical Exam Constitutional:      General: She is not in acute distress.    Appearance: She is not diaphoretic.  HENT:     Head: Normocephalic and atraumatic.     Mouth/Throat:     Pharynx: No oropharyngeal exudate.  Eyes:     General: No scleral icterus.       Right eye: No discharge.        Left  eye: No discharge.  Cardiovascular:     Rate and Rhythm: Normal rate and regular rhythm.     Heart sounds: Normal heart sounds. No murmur. No friction rub. No gallop.   Pulmonary:     Effort: Pulmonary effort is normal. No respiratory distress.     Breath sounds: Normal breath sounds. No wheezing or rales.  Abdominal:     General: Bowel sounds are normal. There is no distension.     Palpations: Abdomen is soft. There is no mass.     Tenderness: There is no abdominal tenderness. There is no guarding or rebound.    Skin:    General: Skin is warm and dry.     Findings: No erythema or rash.  Neurological:     Mental Status: She is alert.     Coordination: Coordination normal.  Psychiatric:        Behavior: Behavior normal.        Thought Content: Thought content normal.        Judgment: Judgment normal.     Lab Review:     Component Value Date/Time   NA 137 07/08/2018 0832   K 3.0 (LL) 07/08/2018 0832   CL 103 07/08/2018 0832   CO2 24 07/08/2018 0832   GLUCOSE 101 (H) 07/08/2018 0832   BUN <4 (L) 07/08/2018 0832   CREATININE 0.60 07/08/2018 0832   CALCIUM 7.6 (L) 07/08/2018 0832   PROT 5.9 (L) 07/08/2018 0832   ALBUMIN 2.7 (L) 07/08/2018 0832   AST 51 (H) 07/08/2018 0832   ALT 29 07/08/2018 0832   ALKPHOS 93 07/08/2018 0832   BILITOT 0.7 07/08/2018 0832   GFRNONAA >60 07/08/2018 0832   GFRAA >60 07/08/2018 0832       Component Value Date/Time   WBC 8.4 07/08/2018 0832   WBC 7.5 07/07/2018 1456   RBC 4.45 07/08/2018 0832   HGB 9.9 (L) 07/08/2018 0832   HCT 29.9 (L) 07/08/2018 0832   PLT 200 07/08/2018 0832   MCV 67.2 (L) 07/08/2018 3614  MCH 22.2 (L) 07/08/2018 0832   MCHC 33.1 07/08/2018 0832   RDW 20.0 (H) 07/08/2018 0832   LYMPHSABS 1.4 07/08/2018 0832   MONOABS 0.4 07/08/2018 0832   EOSABS 0.1 07/08/2018 0832   BASOSABS 0.0 07/08/2018 0832   -------------------------------  Imaging from last 24 hours (if applicable):  Radiology interpretation: Dg  Abdomen 1 View  Result Date: 06/25/2018 CLINICAL DATA:  Abdominal pain status post Whipple procedure. EXAM: ABDOMEN - 1 VIEW COMPARISON:  Radiograph of May 15, 2018. FINDINGS: The bowel gas pattern is normal. Surgical clips are noted in the epigastric region. No radio-opaque calculi or other significant radiographic abnormality are seen. IMPRESSION: No evidence of bowel obstruction or ileus. Electronically Signed   By: Marijo Conception M.D.   On: 06/25/2018 17:11   Ct Abdomen Pelvis W Contrast  Result Date: 07/06/2018 CLINICAL DATA:  49 y/o F; history of pancreatic cancer, ERCP, biliary stent placement. History of Whipple procedure 04/25/2018 with ongoing abdominal pain. EXAM: CT ABDOMEN AND PELVIS WITH CONTRAST TECHNIQUE: Multidetector CT imaging of the abdomen and pelvis was performed using the standard protocol following bolus administration of intravenous contrast. CONTRAST:  61m OMNIPAQUE IOHEXOL 300 MG/ML  SOLN COMPARISON:  06/15/2018 CT abdomen and pelvis. 05/03/2018 MRI of the abdomen. FINDINGS: Lower chest: No acute abnormality. Hepatobiliary: Diffuse hepatic steatosis. Stable heterogeneous non masslike enhancement within right lobe of liver in a similar pattern to the prior MRI given differences in technique. Mild pneumobilia. No biliary ductal dilatation. No obstructive changes of the hepatobiliary limb. Pancreas: Status post Whipple procedure. No recurrent mass identified. Normal appearance of residual pancreatic tail. Spleen: Normal in size without focal abnormality. Adrenals/Urinary Tract: Adrenal glands are unremarkable. Kidneys are normal, without renal calculi, focal lesion, or hydronephrosis. Bladder is unremarkable. Stomach/Bowel: Diffuse low-attenuation wall thickening of the colon as well as the stomach with mucosal enhancement. No obstructive or inflammatory changes of the small bowel. Vascular/Lymphatic: Aortic atherosclerosis. No enlarged abdominal or pelvic lymph nodes. Reproductive:  Uterus and bilateral adnexa are unremarkable. Other: Small volume of ascites. Musculoskeletal: No fracture is seen. IMPRESSION: 1. Progressive diffuse wall thickening and mucosal enhancement of the colon compatible with colitis. New small volume of ascites. 2. New wall thickening and mucosal enhancement of the stomach, possibly concurrent colitis. 3. Status post Whipple procedure. No recurrent mass identified. 4. Diffuse hepatic steatosis. 5. Grossly stable heterogeneous non masslike enhancement within right lobe of liver from prior MRI where it is better characterized. 6.  Aortic Atherosclerosis (ICD10-I70.0). Electronically Signed   By: LKristine GarbeM.D.   On: 07/06/2018 05:51   Ct Abdomen Pelvis W Contrast  Result Date: 06/15/2018 CLINICAL DATA:  Nausea, vomiting. Recent Whipple procedure for pancreatic cancer. EXAM: CT ABDOMEN AND PELVIS WITH CONTRAST TECHNIQUE: Multidetector CT imaging of the abdomen and pelvis was performed using the standard protocol following bolus administration of intravenous contrast. CONTRAST:  1054mOMNIPAQUE IOHEXOL 300 MG/ML  SOLN COMPARISON:  05/09/2018 FINDINGS: Lower chest: Lung bases are clear. No effusions. Heart is normal size. Hepatobiliary: Severe diffuse fatty infiltration of the liver. There appears to been migration of the previously seen pancreatic stent now traversing the choledocho-jejunostomy and in the biliary system. No biliary ductal dilatation. Prior cholecystectomy. Pancreas: Prior Whipple procedure. Previously seen pancreatic ductal stent now within the biliary system/ducts as above. Spleen: No focal abnormality.  Normal size. Adrenals/Urinary Tract: No adrenal abnormality. No focal renal abnormality. No stones or hydronephrosis. Urinary bladder is unremarkable. Stomach/Bowel: There is wall thickening within the ascending colon and  hepatic flexure. Stomach and small bowel decompressed, grossly unremarkable. Postoperative changes within the stomach  and small bowel as related to prior Whipple procedure. Vascular/Lymphatic: Aortic atherosclerosis. No enlarged abdominal or pelvic lymph nodes. Reproductive: Uterus and adnexa unremarkable.  No mass. Other: Small amount of free fluid in the pelvis.  No free air. Musculoskeletal: No acute bony abnormality. IMPRESSION: Changes of prior Whipple procedure. Previously seen pancreatic duct stent presumably has migrated across the choledocho-jejunostomy and is now located in the biliary ducts. No biliary or pancreatic ductal dilatation. Severe diffuse low-density throughout the liver, likely fatty infiltration. Wall thickening within the ascending colon and hepatic flexure concerning for colitis. Small amount of free fluid in the pelvis. Aortic atherosclerosis. Electronically Signed   By: Rolm Baptise M.D.   On: 06/15/2018 15:33   Dg Duanne Limerick W Single Cm (sol Or Thin Ba)  Result Date: 06/18/2018 CLINICAL DATA:  Post Whipple procedure.  Vomiting. EXAM: UPPER GI SERIES WITH KUB TECHNIQUE: After obtaining a scout radiograph a routine upper GI series was performed using thin barium FLUOROSCOPY TIME:  Fluoroscopy Time:  2 minutes 12 seconds Radiation Exposure Index (if provided by the fluoroscopic device): Number of Acquired Spot Images: 0 COMPARISON:  CT 06/15/2018 FINDINGS: Scout film of the abdomen demonstrates postoperative changes. No evidence of bowel obstruction. The stent which was previously located in the pancreatic duct but was noticed to have migrated across the choledochojejunostomy on recent CT is seen in the right upper quadrant. Fluoroscopic evaluation of swallowing demonstrates normal esophageal motility. No esophageal stricture. Small gastric pouch noted. Slight delay in passage of contrast across the gastrojejunostomy, but the stomach finally empties. There is a small outpouching of contrast noted along the superior margin of the gastric pouch, felt to represent a small intraluminal outpouching in the region of  the gastric cardia rather than leak. Proximal small bowel appears normal. IMPRESSION: Slight delayed emptying of the gastric pouch through the gastrojejunostomy, but the stomach finding empties. No evidence of obstruction. The proximal small bowel appears normal. Small outpouching of contrast along the superior margin of the stomach felt represent a small gastric outpouching rather than a leak. Electronically Signed   By: Rolm Baptise M.D.   On: 06/18/2018 11:45

## 2018-07-12 NOTE — Telephone Encounter (Signed)
No los per 6/26.

## 2018-07-16 ENCOUNTER — Inpatient Hospital Stay: Payer: Medicaid Other

## 2018-07-16 ENCOUNTER — Inpatient Hospital Stay: Payer: Medicaid Other | Admitting: Hematology

## 2018-07-17 ENCOUNTER — Other Ambulatory Visit: Payer: Self-pay

## 2018-07-17 ENCOUNTER — Ambulatory Visit (HOSPITAL_COMMUNITY): Payer: Medicaid Other

## 2018-07-17 ENCOUNTER — Encounter: Payer: Self-pay | Admitting: Physical Medicine & Rehabilitation

## 2018-07-17 ENCOUNTER — Encounter: Payer: Medicaid Other | Attending: Registered Nurse | Admitting: Physical Medicine & Rehabilitation

## 2018-07-17 VITALS — BP 115/78 | HR 120 | Temp 97.5°F | Ht 59.0 in | Wt 105.0 lb

## 2018-07-17 DIAGNOSIS — Z825 Family history of asthma and other chronic lower respiratory diseases: Secondary | ICD-10-CM | POA: Insufficient documentation

## 2018-07-17 DIAGNOSIS — R5381 Other malaise: Secondary | ICD-10-CM | POA: Diagnosis present

## 2018-07-17 DIAGNOSIS — R339 Retention of urine, unspecified: Secondary | ICD-10-CM | POA: Diagnosis not present

## 2018-07-17 DIAGNOSIS — D62 Acute posthemorrhagic anemia: Secondary | ICD-10-CM | POA: Diagnosis not present

## 2018-07-17 DIAGNOSIS — Z8049 Family history of malignant neoplasm of other genital organs: Secondary | ICD-10-CM | POA: Insufficient documentation

## 2018-07-17 DIAGNOSIS — Z801 Family history of malignant neoplasm of trachea, bronchus and lung: Secondary | ICD-10-CM | POA: Diagnosis not present

## 2018-07-17 DIAGNOSIS — K529 Noninfective gastroenteritis and colitis, unspecified: Secondary | ICD-10-CM | POA: Diagnosis not present

## 2018-07-17 DIAGNOSIS — C259 Malignant neoplasm of pancreas, unspecified: Secondary | ICD-10-CM | POA: Insufficient documentation

## 2018-07-17 DIAGNOSIS — Z8042 Family history of malignant neoplasm of prostate: Secondary | ICD-10-CM | POA: Insufficient documentation

## 2018-07-17 DIAGNOSIS — R609 Edema, unspecified: Secondary | ICD-10-CM | POA: Insufficient documentation

## 2018-07-17 DIAGNOSIS — Z87891 Personal history of nicotine dependence: Secondary | ICD-10-CM | POA: Insufficient documentation

## 2018-07-17 DIAGNOSIS — Z800 Family history of malignant neoplasm of digestive organs: Secondary | ICD-10-CM | POA: Insufficient documentation

## 2018-07-17 DIAGNOSIS — Z8249 Family history of ischemic heart disease and other diseases of the circulatory system: Secondary | ICD-10-CM | POA: Insufficient documentation

## 2018-07-17 DIAGNOSIS — Z833 Family history of diabetes mellitus: Secondary | ICD-10-CM | POA: Insufficient documentation

## 2018-07-17 NOTE — Patient Instructions (Signed)
WORK ON IMPROVING YOUR PROTEIN INTAKE  USE COMPRESSIONS STOCKINGS IF NEEDED FOR EDEMA CONTROL    PROBIOTIC AND FIBER TO HELP BULK UP YOUR STOOL.

## 2018-07-17 NOTE — Progress Notes (Signed)
Subjective:    Patient ID: Jane Brooks, female    DOB: 1970/02/16, 48 y.o.   MRN: [id number]  HPI   Mrs Jane Brooks is here in follow up of her inpatient rehab stay. She left [place] in early May. She has had struggles with persistent nausea and vomiting as well as colitis. She was in the ED twice last month with ongoing issues. However over the last few weeks she has been feeling much better. She is active at home, sleeping well. Appetite is picking up. She complains of soft, bordering on loose stool at times, too. She maintains follow up with surgery as well as oncology and GI.   From a functional standpoint, [name] is driving, independent with mobility and household activities.   Pain Inventory Average Pain 6 Pain Right Now 4 My pain is sharp and stabbing  In the last 24 hours, has pain interfered with the following? General activity 0 Relation with others 0 Enjoyment of life 0 What TIME of day is your pain at its worst? morning Sleep (in general) Fair  Pain is worse with: bending and some activites Pain improves with: heat/ice and medication Relief from Meds: 9  Mobility walk without assistance  Function disabled: date disabled .  Neuro/Psych No problems in this area  Prior Studies Any changes since last visit?  no  Physicians involved in your care Any changes since last visit?  no   Family History  Problem Relation Age of Onset  . Diabetes Mother   . Hypertension Mother   . COPD Mother   . Uterine cancer Mother 96       had hysterectomy  . Diabetes Sister   . Hypertension Sister   . Lung cancer Maternal Grandmother        lung cancer  . Hypertension Sister    Social History   Socioeconomic History  . Marital status: Single    Spouse name: Not on file  . Number of children: Not on file  . Years of education: Not on file  . Highest education level: Not on file  Occupational History  . Occupation: unemployed  Social Needs  . Financial resource strain:  Not on file  . Food insecurity    Worry: Not on file    Inability: Not on file  . Transportation needs    Medical: Not on file    Non-medical: Not on file  Tobacco Use  . Smoking status: Former Smoker    Packs/day: 1.00    Years: 37.00    Pack years: 37.00    Types: Cigarettes    Quit date: 04/25/2018    Years since quitting: 0.2  . Smokeless tobacco: Never Used  Substance and Sexual Activity  . Alcohol use: Not Currently  . Drug use: Never  . Sexual activity: Not Currently  Lifestyle  . Physical activity    Days per week: Not on file    Minutes per session: Not on file  . Stress: Not on file  Relationships  . Social Herbalist on phone: Not on file    Gets together: Not on file    Attends religious service: Not on file    Active member of club or organization: Not on file    Attends meetings of clubs or organizations: Not on file    Relationship status: Not on file  Other Topics Concern  . Not on file  Social History Narrative  . Not on file   Past Surgical History:  Procedure Laterality Date  . BILIARY STENT PLACEMENT  10/31/2017   Procedure: BILIARY STENT PLACEMENT;  Surgeon: [name], MD;  Location[facility] ENDOSCOPY;  Service: Gastroenterology;;  . ERCP N/A 10/31/2017   Procedure: ENDOSCOPIC RETROGRADE CHOLANGIOPANCREATOGRAPHY (ERCP);  Surgeon: [name], MD;  Location: [facility] ENDOSCOPY;  Service: Gastroenterology;  Laterality: N/A;  . ESOPHAGOGASTRODUODENOSCOPY N/A 11/08/2017   Procedure: ESOPHAGOGASTRODUODENOSCOPY (EGD);  Surgeon: Milus Banister, MD;  Location: [facility] ENDOS[facility];  Service: Endoscopy;  Laterality: N/A;  . EUS N/A 11/08/2017   Procedure: UPPER ENDOSCOPIC ULTRASOUND (EUS) RADIAL;  Surgeon: Milus Banister, MD;  [facility];  Service: Endoscopy;  Laterality: N/A;  . FINE NEEDLE ASPIRATION  11/08/2017   Procedure: FINE NEEDLE ASPIRATION;  Surgeon: Milus Banister, MD;  [facility];  Service: Endoscopy;;  . IR IMAGING  GUIDED PORT INSERTION  11/15/2017  . LAPAROSCOPY N/A 04/25/2018   Procedure: LAPAROSCOPY DIAGNOSTIC;  Surgeon: Stark Klein, MD;  Location: [place];  Service: General;  Laterality: N/A;  GENERAL AND EPIDURAL ANESTHESIA  . SPHINCTEROTOMY  10/31/2017   Procedure: SPHINCTEROTOMY;  Surgeon: [name], MD;  Location: [facility] ENDOSCOPY;  Service: Gastroenterology;;  . TONSILLECTOMY    . WHIPPLE PROCEDURE N/A 04/25/2018   Procedure: WHIPPLE PROCEDURE, RECONSTRUCTION OF PORTAL VEIN;  Surgeon: Stark Klein, MD;  Location: [place];  Service: General;  Laterality: N/A;   Past Medical History:  Diagnosis Date  . Family history of lung cancer   . Family history of prostate cancer   . Family history of uterine cancer   . Gallstones 10/2017  . Pancreatic cancer ([place])   . Pneumothorax, closed, traumatic    years ago  . PONV (postoperative nausea and vomiting)    BP 115/78   Pulse (!) 120   Temp (!) 97.5 F (36.4 C)   Ht 4' 11"  (1.499 m)   Wt 105 lb (47.6 kg)   SpO2 98%   BMI 21.21 kg/m   Opioid Risk Score:   Fall Risk Score:  `1  Depression screen PHQ 2/9  Depression screen PHQ 2/9 06/25/2018  Decreased Interest 0  Down, Depressed, Hopeless 1  PHQ - 2 Score 1     Review of Systems  Constitutional: Positive for appetite change and unexpected weight change.  HENT: Negative.   Eyes: Negative.   Respiratory: Negative.   Cardiovascular: Negative.   Gastrointestinal: Positive for abdominal pain, diarrhea, nausea and vomiting.  Endocrine: Negative.   Genitourinary: Negative.   Musculoskeletal: Positive for joint swelling.  Skin: Negative.   Allergic/Immunologic: Negative.   Neurological: Negative.   Hematological: Negative.   Psychiatric/Behavioral: Negative.   All other systems reviewed and are negative.      Objective:   Physical Exam   General: Alert and oriented x 3, No apparent distress. Small builds HEENT: Head is normocephalic, atraumatic, PERRLA, EOMI, sclera anicteric, oral  mucosa pink and moist, dentition intact, ext ear canals clear,  Neck: Supple without JVD or lymphadenopathy Heart: Reg rate and rhythm. No murmurs rubs or gallops Chest: CTA bilaterally without wheezes, rales, or rhonchi; no distress Abdomen: remains tender, non-distended Extremities: No clubbing, cyanosis, or edema. Pulses are 2+ Skin: Clean and intact without signs of breakdown Neuro: Pt is cognitively appropriate with normal insight, memory, and awareness. Cranial nerves 2-12 are intact. Sensory exam is normal. Reflexes are 2+ in all 4's. Fine motor coordination is intact. No tremors. Motor function is grossly 5/5.  Musculoskeletal: Full ROM, No pain with AROM or PROM in the neck, trunk, or  extremities. Posture appropriate Psych: Pt's affect is appropriate. Pt is cooperative and in good spirits        Assessment & Plan:  1.Debilitysecondary to pancreatic cancer status post Whipple procedure              -moving well  -encouraged ongoing HEP to build stamina 2. Pain Management:  MSIR prn and MS ER scheduled per oncology 3. Mood: in good spirits 5. Intermittent colitis: abx completed.   -recommended probiotic and /or fiber as part of daily intake 6. Edema: likely nutritional   -encouraged increased protein intake and better nutrition overall  -elevation of legs when sitting as possible  -KH Teds if needed  9. ABLA:                 8.7 today---stable 14. Hepatic encephalopathy: Mentation has cleared and ammonia level WNL 15. Urinary retention:              -encouraged to double void if needed  -off all meds.   Fifteen minutes of face to face patient care time were spent during this visit. All questions were encouraged and answered.  Follow up with me prn .

## 2018-07-18 NOTE — Progress Notes (Signed)
[place]   [phone] [phone]   Clinic Follow up Note   Patient Care Team: Patient, No Pcp Per as PCP - General ([place]) Milus Banister, MD as Attending Physician (Gastroenterology) Truitt Merle, MD as Consulting Physician (Hematology)  I connected with Marcelo Baldy on 07/26/2018 at  2:30 PM EDT by telephone visit and verified that I am speaking with the correct person using two identifiers.  I discussed the limitations, risks, security and privacy concerns of performing an evaluation and management service by telephone and the availability of in person appointments. I also discussed with the patient that there may be a patient responsible charge related to this service. The patient expressed understanding and agreed to proceed.   Patient's location:  Her home  Provider's location:  My Office   CHIEF COMPLAINT: F/u on pancreatic cancer  SUMMARY OF ONCOLOGIC HISTORY: Oncology History Overview Note  Cancer Staging Pancreatic cancer [facility]) Staging form: Exocrine Pancreas, AJCC 8th Edition - Clinical stage from 11/08/2017: Stage IB (cT2, cN0, cM0) - Signed by Truitt Merle, MD on 11/09/2017     Pancreatic cancer ([place])  10/30/2017 Imaging   MRI Abdomen 10/30/17  IMPRESSION: 1. Marked intra and extrahepatic biliary duct dilatation with abrupt cut off of the common bile duct at the pancreatic head. This is associated with marked dilatation of the main pancreatic duct also with an abrupt cut off at the level of the pancreatic head. Pancreatic parenchyma in the body and tail of pancreas is markedly atrophic. A subtle 2.5 x 3 cm lesion with differential signal intensity is identified in the head of the pancreas which appears slightly hypoenhancing on postcontrast imaging. Together, these features are highly concerning for pancreatic adenocarcinoma. EUS is recommended to further evaluate. 2. Portal vein is patent although there is some  mass-effect on the portal splenic confluence and IVC. Fat planes around celiac axis and SMA appear preserved. 3. Upper normal to borderline portal caval lymph node associated with small right para-aortic lymphadenopathy.     10/30/2017 Tumor Marker   Baseline Ca 19-9 at 140   10/31/2017 Procedure   ERCP by Dr. Lyndel Safe 10/31/17  IMPRESSION Malignant appearing CBD stricture (due to pancreatic mass) status post sphincterotomy and stent insertion.   11/08/2017 Cancer Staging   Staging form: Exocrine Pancreas, AJCC 8th Edition - Clinical stage from 11/08/2017: Stage IB (cT2, cN0, cM0) - Signed by Truitt Merle, MD on 11/09/2017   11/08/2017 Procedure   EUS and EGD by Dr. Ardis Hughs 11/08/17  IMPRESSION -Very vague, irregularly bordered, approximately 2.6cm mass was found in the head of pancreas. This causes biliary and pancreatic duct obstruction. The previously plastic biliary stent is in good position. The mass directly abuts and attenuates the PV/SMV confluence, suggesting invasion. FNA performed and the preliminary cytology review is positive for maligancy (adenocarcinoma). - [place] [facility] office will arrange referrals to medical and surgical oncology. - Case to be discussed at upcoming multidisciplinary GI tumor conference.   11/08/2017 Initial Biopsy   Biopsy Cytology 11/08/17 Diagnosis FINE NEEDLE ASPIRATION, ENDOSCOPIC, PANCREAS HEAD (SPECIMEN 1 OF 1 COLLECTED 11/08/17): MALIGNANT CELLS CONSISTENT WITH ADENOCARCINOMA.   11/09/2017 Initial Diagnosis   Pancreatic cancer ([place])   11/15/2017 Imaging   CT Chest and Pelvis WO Contrast 11/15/17 IMPRESSION: 1. No findings of active malignancy in the chest or pelvis. 2. Aortic Atherosclerosis (ICD10-I70.0). Coronary atherosclerosis. 3. Faint centrilobular nodularity in the lung apices, raising the possibility of hypersensitivity pneumonitis or less likely respiratory bronchiolitis. Airway thickening is  present, suggesting bronchitis or  reactive airways disease.   11/19/2017 -  Chemotherapy   FOLFIRINOX q2weeks with Neulasta injection for 4 months starting 11/19/17. Completed 8 cycles on 03/07/18    02/04/2018 Imaging   CT AP W Contrast 02/04/18  IMPRESSION: 1. No substantial change in size of mass involving head of pancreas. A aortocaval lymph node which appears increased in the interval measuring 1.1 cm versus 0.8 cm previously. Stable 1.2 cm peripancreatic node. 2. Dilated proximal common bile duct is mildly increased in caliber compared with 12/12/2017. The common bile duct stent remains in place in appears to be in appropriate position.    04/19/2018 Imaging   CT AP 04/19/18  IMPRESSION: 1. Essentially stable exam compared to most recent CT scans of 02/04/2018 and 12/12/2017. 2. Stable mass in the head of the pancreas with duct dilatation and atrophy upstream. Mass continues to surround the GDA and contacts the portal vein but is free of the celiac trunk and SMA. Mass probably contacts the RIGHT hepatic artery at the takeoff of the GDA (image 56/5). Dominant LEFT hepatic artery originates from the LEFT gastric. 3. Mild intrahepatic and extrahepatic duct dilatation similar to prior with flexible stent in the common bile duct. 4. No new or progressive disease. 5. No peritoneal metastasis.   04/25/2018 Imaging   CT Angio chest 04/25/18  IMPRESSION: 1. No demonstrable pulmonary embolus. No thoracic aortic aneurysm or dissection. 2. Areas of atelectatic change bilaterally. No consolidation. No pneumothorax. Endotracheal tube tip is slightly superior to the carina. 3. Enlarged subcarinal lymph node, concerning for neoplastic etiology. 4. 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 Biliary stent present. Biliary duct air is felt to be secondary to the stent placement. 5. Mild upper abdominal ascites. Pneumoperitoneum with apparent drainage catheter present. The pneumoperitoneum is likely of  post procedural/postoperative etiology. Bowel perforation in this circumstance cannot be excluded entirely, however. Close clinical assessment in this regard advised. 6.  Left ventricular hypertrophy.   04/25/2018 Surgery   LAPAROSCOPY DIAGNOSTIC and WHIPPLE PROCEDURE, RECONSTRUCTION OF PORTAL VEIN by Dr. Barry Dienes, Dr. Marcello Moores 04/25/18    04/25/2018 Pathology Results    Diagnosis 04/25/18 1. Whipple procedure/resection, with gallbladder with segment of portal vein - INVASIVE ADENOCARCINOMA, MODERATELY DIFFERENTIATED, SPANNING 3.5 CM. - ADENOCARCINOMA IS PRESENT AT THE SUPERIOR MARGIN OF THE ATTACHED VEIN AND THE INFERIOR MARGIN OF THE ATTACHED VEIN (BLOCKS E AND F). - ADENOCARCINOMA EXTENDS INTO PERIPANCREATIC SOFT TISSUE. - PERINEURAL INVASION IS IDENTIFIED, DIFFUSE. - METASTATIC ADENOCARCINOMA IN 1 OF 24 LYMPH NODES (1/24). - SEE ONCOLOGY TABLE BELOW. 2. Lymph node, biopsy, portal - THERE IS NO EVIDENCE OF CARCINOMA IN 5 OF 5 LYMPH NODES (0/5). 3. Pancreas, biopsy, additional pancreatic margin - HIGH GRADE GLANDULAR DYSPLASIA, FOCAL    05/09/2018 Imaging   CT AP W Contrast 05/09/18 IMPRESSION: Status post Whipple's procedure. 1 surgical drain remains in the area. Pancreatic duct stent is noted.   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 It appears to be stable in size compared to prior exam.   Wall and fold thickening is seen involving right and transverse colon which may represent edema or possibly inflammation.   Aortic Atherosclerosis (ICD10-I70.0).   06/15/2018 Imaging   CT AP W Contrast 06/15/18 IMPRESSION: Changes of prior Whipple procedure. Previously seen pancreatic duct stent presumably has migrated across the choledocho-jejunostomy and is now located in the biliary  ducts. No biliary or pancreatic ductal dilatation.   Severe diffuse low-density throughout the liver,  likely fatty infiltration.   Wall thickening within the ascending colon and hepatic flexure concerning for colitis.   Small amount of free fluid in the pelvis.   Aortic atherosclerosis.   07/25/2018 PET scan   PET 07/25/18  IMPRESSION: 1. Right hepatic lobe hypermetabolism, in the setting of heterogeneous hepatic steatosis. Finding is highly suspicious for isolated hepatic metastasis. 2. More equivocal lower level hypermetabolism along the medial hepatic capsule and within the hepatic dome. These areas could be postoperative and physiologic respectively. Recommend attention on follow-up. 3. Coronary artery atherosclerosis. Aortic Atherosclerosis (ICD10-I70.0).      CURRENT THERAPY:  Pending pathology   INTERVAL HISTORY:  Idelia Caudell is here for a follow up. She presents to the clinic alone. She notes she is doing well. She notes she had a rough weekend last week, so she did not attend her IV Fluids. She was not able to come in given her transportation issue while her car is in the shop. She has been feeling much better after getting IV Fluids. She continues to try to eat and nausea calmed down, she only had 1 episode of emesis. She notes her abdominal pain is improving.     REVIEW OF SYSTEMS:   Constitutional: Denies fevers, chills or abnormal weight loss Eyes: Denies blurriness of vision Ears, nose, mouth, throat, and face: Denies mucositis or sore throat Respiratory: Denies cough, dyspnea or wheezes Cardiovascular: Denies palpitation, chest discomfort or lower extremity swelling Gastrointestinal:  Denies nausea, heartburn or change in bowel habits Skin: Denies abnormal skin rashes Lymphatics: Denies new lymphadenopathy or easy bruising Neurological:Denies numbness, tingling or new weaknesses Behavioral/Psych: Mood is stable, no new changes  All other systems were reviewed with the patient and are negative.  MEDICAL HISTORY:  Past Medical History:  Diagnosis Date    Family history of lung cancer    Family history of prostate cancer    Family history of uterine cancer    Gallstones 10/2017   Pancreatic cancer ([place])    Pneumothorax, closed, traumatic    years ago   PONV (postoperative nausea and vomiting)     SURGICAL HISTORY: Past Surgical History:  Procedure Laterality Date   BILIARY STENT PLACEMENT  10/31/2017   Procedure: BILIARY STENT PLACEMENT;  Surgeon: [name], MD;  Location: [place];  Service: Gastroenterology;;   ERCP N/A 10/31/2017   Procedure: ENDOSCOPIC RETROGRADE CHOLANGIOPANCREATOGRAPHY (ERCP);  Surgeon: [name], MD;  Location[facility] ENDOSCOPY;  Service: Gastroenterology;  Laterality: N/A;   ESOPHAGOGASTRODUODENOSCOPY N/A 11/08/2017   Procedure: ESOPHAGOGASTRODUODENOSCOPY (EGD);  Surgeon: Milus Banister, MD;  Location: [facility] ENDOSCOPY;  Service: Endoscopy;  Laterality: N/A;   EUS N/A 11/08/2017   Procedure: UPPER ENDOSCOPIC ULTRASOUND (EUS) RADIAL;  Surgeon: Milus Banister, MD;  Location: [facility] ENDOSCOPY;  Service: Endoscopy;  Laterality: N/A;   FINE NEEDLE ASPIRATION  11/08/2017   Procedure: FINE NEEDLE ASPIRATION;  Surgeon: Milus Banister, MD;  Location: [facility] ENDOSCOPY;  Service: Endoscopy;;   IR IMAGING GUIDED PORT INSERTION  11/15/2017   LAPAROSCOPY N/A 04/25/2018   Procedure: LAPAROSCOPY DIAGNOSTIC;  Surgeon: Stark Klein, MD;  Location: [place];  Service: General;  Laterality: N/A;  GENERAL AND EPIDURAL ANESTHESIA   SPHINCTEROTOMY  10/31/2017   Procedure: SPHINCTEROTOMY;  Surgeon: [name], MD;  Location: [place];  Service: Gastroenterology;;   TONSILLECTOMY     WHIPPLE PROCEDURE N/A 04/25/2018   Procedure: WHIPPLE PROCEDURE, RECONSTRUCTION OF PORTAL VEIN;  Surgeon: Stark Klein, MD;  Location: [place];  Service: General;  Laterality: N/A;    I have reviewed the social history and family history with the patient and they are unchanged from previous note.  ALLERGIES:  is allergic to  latex.  MEDICATIONS:  Current Outpatient Medications  Medication Sig Dispense Refill   acetaminophen (TYLENOL) 325 MG tablet Take 1-2 tablets (325-650 mg total) by mouth every 4 (four) hours as needed for mild pain.     cyclobenzaprine (FLEXERIL) 5 MG tablet Take 1 tablet (5 mg total) by mouth 3 (three) times daily as needed for muscle spasms. 60 tablet 1   metoCLOPramide (REGLAN) 5 MG tablet Take 1 tablet (5 mg total) by mouth every 6 (six) hours as needed for nausea or vomiting. 60 tablet 1   morphine (MS CONTIN) 15 MG 12 hr tablet Take 1 tablet (15 mg total) by mouth every 12 (twelve) hours. 60 tablet 0   morphine (MSIR) 15 MG tablet Take 1 tablet (15 mg total) by mouth every 4 (four) hours as needed for severe pain. 60 tablet 0   ondansetron (ZOFRAN) 8 MG tablet Take 1 tablet (8 mg total) by mouth every 8 (eight) hours as needed for nausea or vomiting. 30 tablet 0   pantoprazole (PROTONIX) 40 MG tablet Take 1 tablet (40 mg total) by mouth daily. 60 tablet 3   potassium chloride SA (K-DUR) 20 MEQ tablet Take 1 tablet (20 mEq total) by mouth daily. 30 tablet 1   promethazine (PHENERGAN) 25 MG tablet Take 1 tablet (25 mg total) by mouth every 6 (six) hours as needed for nausea or vomiting. 30 tablet 0   No current facility-administered medications for this visit.     PHYSICAL EXAMINATION: ECOG PERFORMANCE STATUS: 2 - Symptomatic, <50% confined to bed  No vitals taken today, Exam not performed today  LABORATORY DATA:  I have reviewed the data as listed CBC Latest Ref Rng & Units 07/08/2018 07/07/2018 07/06/2018  WBC 4.0 - 10.5 K/uL 8.4 7.5 6.5  Hemoglobin 12.0 - 15.0 g/dL 9.9(L) 11.1(L) 11.0(L)  Hematocrit 36.0 - 46.0 % 29.9(L) 34.0(L) 34.0(L)  Platelets 150 - 400 K/uL 200 207 227     CMP Latest Ref Rng & Units 07/23/2018 07/08/2018 07/07/2018  Glucose 70 - 99 mg/dL 72 101(H) 90  BUN 6 - 20 mg/dL 5(L) <4(L) <5(L)  Creatinine 0.44 - 1.00 mg/dL 0.58 0.60 0.57  Sodium 135 - 145  mmol/L 136 137 135  Potassium 3.5 - 5.1 mmol/L 4.2 3.0(LL) 3.4(L)  Chloride 98 - 111 mmol/L 104 103 103  CO2 22 - 32 mmol/L 24 24 24   Calcium 8.9 - 10.3 mg/dL 7.7(L) 7.6(L) 8.6(L)  Total Protein 6.5 - 8.1 g/dL 5.9(L) 5.9(L) 6.3(L)  Total Bilirubin 0.3 - 1.2 mg/dL 0.8 0.7 0.9  Alkaline Phos 38 - 126 U/L 131(H) 93 103  AST 15 - 41 U/L 72(H) 51(H) 43(H)  ALT 0 - 44 U/L 39 29 29      RADIOGRAPHIC STUDIES: I have personally reviewed the radiological images as listed and agreed with the findings in the report. Nm Pet Image Initial (pi) Skull Base To Thigh  Result Date: 07/25/2018 CLINICAL DATA:  Initial treatment strategy for malignant neoplasm of head of pancreas. Status post Whipple procedure on 04/25/2018. EXAM: NUCLEAR MEDICINE PET SKULL BASE TO THIGH TECHNIQUE: 5.0 mCi F-18 FDG was injected intravenously. Full-ring PET imaging was performed from the skull base to thigh after the radiotracer. CT data was obtained and used  for attenuation correction and anatomic localization. Fasting blood glucose: 82 mg/dl COMPARISON:  Abdominopelvic CT 07/06/2018. Abdominal MRI of 10/30/2017. chest CT 05/03/2018. FINDINGS: Mediastinal blood pool activity: SUV max 1.8 Liver activity: SUV max NA NECK: No areas of abnormal hypermetabolism. Incidental CT findings: No cervical adenopathy. CHEST: Bilateral small axillary nodes with low-level hypermetabolism. Example on the left at a S.U.V. max of 1.8. Favored to be reactive. No pulmonary parenchymal hypermetabolism. Incidental CT findings: Right Port-A-Cath tip high right atrium. Right coronary artery calcification. ABDOMEN/PELVIS: A focus of moderate right hepatic lobe hypermetabolism is in the region of heterogeneous sparing of steatosis. Example at a S.U.V. max of 7.1 on approximately image 98/4. Foci of more mild, amorphous hypermetabolism within the hepatic dome are indeterminate, including on image 86/4. Foci of mild to moderate hypermetabolism are identified within  the porta hepatis, along the medial right hepatic border. No well-defined adenopathy in these areas. Example at a S.U.V. max of 4.4 on image 102/4 and a S.U.V. max of 3.5 on image 98/4. Incidental CT findings: Marked hepatic steatosis. Status post Whipple procedure. Abdominal aortic atherosclerosis. Normal adrenal glands. Peritoneal edema without well-defined peritoneal/omental mass. SKELETON: No abnormal marrow activity. Incidental CT findings: Remote left rib fractures. IMPRESSION: 1. Right hepatic lobe hypermetabolism, in the setting of heterogeneous hepatic steatosis. Finding is highly suspicious for isolated hepatic metastasis. 2. More equivocal lower level hypermetabolism along the medial hepatic capsule and within the hepatic dome. These areas could be postoperative and physiologic respectively. Recommend attention on follow-up. 3. Coronary artery atherosclerosis. Aortic Atherosclerosis (ICD10-I70.0). Electronically Signed   By: Abigail Miyamoto M.D.   On: 07/25/2018 09:33   Vas [place] Lower Extremity Venous (dvt)  Result Date: 07/25/2018  Lower Venous Study Indications: Swelling.  Comparison Study: No prior Performing Technologist: Abram Sander RVS  Examination Guidelines: A complete evaluation includes B-mode imaging, spectral Doppler, color Doppler, and power Doppler as needed of all accessible portions of each vessel. Bilateral testing is considered an integral part of a complete examination. Limited examinations for reoccurring indications may be performed as noted.  +-----+---------------+---------+-----------+----------+-------+  RIGHT Compressibility Phasicity Spontaneity Properties Summary  +-----+---------------+---------+-----------+----------+-------+  CFV   Full            Yes       Yes                             +-----+---------------+---------+-----------+----------+-------+   +---------+---------------+---------+-----------+----------+---------------+  LEFT       Compressibility Phasicity Spontaneity Properties Summary          +---------+---------------+---------+-----------+----------+---------------+  CFV       Full            Yes       Yes                    thickened walls  +---------+---------------+---------+-----------+----------+---------------+  SFJ       Full                                             thickened walls  +---------+---------------+---------+-----------+----------+---------------+  FV Prox   Full                                                              +---------+---------------+---------+-----------+----------+---------------+  FV Mid    Full                                                              +---------+---------------+---------+-----------+----------+---------------+  FV Distal Full                                                              +---------+---------------+---------+-----------+----------+---------------+  PFV       Full                                                              +---------+---------------+---------+-----------+----------+---------------+  POP       Full            Yes       Yes                                     +---------+---------------+---------+-----------+----------+---------------+  PTV       Full                                                              +---------+---------------+---------+-----------+----------+---------------+  PERO      Full                                                              +---------+---------------+---------+-----------+----------+---------------+     Summary: Right: No evidence of common femoral vein obstruction. Left: There is no evidence of deep vein thrombosis in the lower extremity. No cystic structure found in the popliteal fossa.  *See table(s) above for measurements and observations. Electronically signed by Servando Snare MD on 07/25/2018 at 5:09:21 PM.    Final      ASSESSMENT & PLAN:  Lisseth Brazeau is a 48 y.o. female with    1. 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  -Unfortunately her recovery has been difficult with abdominal pain, nausea, vomiting and diarrhea, multiple ED visits and hospitalization -CT abdomen pelvis from 07/06/18 was negative for cancer recurrence, it showed colitis.  However suspicion of metastatic disease due to her persistent GI symptoms -She continues to improve with supportive IV Fluids. GI symptoms are slowly resolving.  -I personally reviewed and discussed her PET from 07/25/18 which showed right hepatic lobe hypermetabolic activity, which  is suspicious for isolated liver metastasis. There is also mild uptake of LN along medial hepatic capsule and within the hepatic dome, no other evidence of mets  -I recommend liver biopsy to determine if this is metastasis. She is agreeable. I spoke with IR Dr. Earleen Newport  -f/u after biopsy     2. Abdominal pain, recent Colitis with N&V -She was admitted to ED recently and treated with antibiotics.  -Continue pain medication on morphine and flexeril, I will restart MS contin 58m q12h, and refilled her morphine 165mPRN today  -Continue Antiemetics  -Will set up IVF twice a week in my office and weekly visit for symptom management and supportive care for next 2-3 weeks  -Her nausea is improving, with occasional emesis. Her abdominal pain is improving. She is responding to IV Fluids well, continue.   3. Social and FiAcupuncturistI previously referred to [place] new [place]. -She now has her puppy and doing well with him.  4. 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 Genetics -She was seen by the geDietitianenetic  testhasbeen held due to lack of insurance.  6. Smoking Cessation  -She is currently down to smoking 3/4 ppd -I strongly encouraged her to continue to reduce and completely quit as going into surgery this can effect her recovery. She understands.  -She notes she has further cut back on smoking and continues to work on quitting completely. I encouraged her to continue.   Plan -PET scan reviewed with pt.  -IR liver biopsy in 1-2 weeks  -f/u in 2 weeks  -she will call if she needs IVF    No problem-specific Assessment & Plan notes found for this encounter.   Orders Placed This Encounter  Procedures   [place]SY (LIVER)    Standing Status:   Future    Standing Expiration Date:   09/26/2019    Scheduling Instructions:     Dr. WaEarleen Newportas reviewed her scan and approved the biopsy, please schedule it ASAP, thanks    Order Specific Question:   Lab orders requested (DO NOT place separate lab orders, these will be automatically ordered during procedure specimen collection):    Answer:   Surgical Pathology    Order Specific Question:   Reason for Exam (SYMPTOM  OR DIAGNOSIS REQUIRED)    Answer:   rule out liver metastasis    Order Specific Question:   Preferred location?    [facility]   [facility]  I discussed the assessment and treatment plan with the patient. The patient was provided an opportunity to ask questions and all were answered. The patient agreed with the plan and demonstrated an understanding of the instructions.  The patient was advised to call back or seek an in-person evaluation if the symptoms worsen or if the condition fails to improve as anticipated.  I provided 22 minutes of non face-to-face telephone visit time during this encounter, and > 50% was spent counseling as documented under my assessment & plan.     YaTruitt MerleMD 07/26/2018   I, AmJoslyn Devonam acting as scribe for YaTruitt MerleMD.   I have reviewed the above documentation for accuracy and  completeness, and I agree with the above.

## 2018-07-19 ENCOUNTER — Inpatient Hospital Stay: Payer: Medicaid Other | Attending: Hematology

## 2018-07-19 DIAGNOSIS — I2510 Atherosclerotic heart disease of native coronary artery without angina pectoris: Secondary | ICD-10-CM | POA: Insufficient documentation

## 2018-07-19 DIAGNOSIS — R55 Syncope and collapse: Secondary | ICD-10-CM | POA: Insufficient documentation

## 2018-07-19 DIAGNOSIS — C257 Malignant neoplasm of other parts of pancreas: Secondary | ICD-10-CM | POA: Insufficient documentation

## 2018-07-19 DIAGNOSIS — F17210 Nicotine dependence, cigarettes, uncomplicated: Secondary | ICD-10-CM | POA: Insufficient documentation

## 2018-07-19 DIAGNOSIS — Z801 Family history of malignant neoplasm of trachea, bronchus and lung: Secondary | ICD-10-CM | POA: Insufficient documentation

## 2018-07-19 DIAGNOSIS — Z8042 Family history of malignant neoplasm of prostate: Secondary | ICD-10-CM | POA: Insufficient documentation

## 2018-07-19 DIAGNOSIS — Z79899 Other long term (current) drug therapy: Secondary | ICD-10-CM | POA: Insufficient documentation

## 2018-07-19 DIAGNOSIS — I700 Atherosclerosis of aorta: Secondary | ICD-10-CM | POA: Insufficient documentation

## 2018-07-19 DIAGNOSIS — D6481 Anemia due to antineoplastic chemotherapy: Secondary | ICD-10-CM | POA: Insufficient documentation

## 2018-07-19 DIAGNOSIS — D563 Thalassemia minor: Secondary | ICD-10-CM | POA: Insufficient documentation

## 2018-07-19 DIAGNOSIS — R600 Localized edema: Secondary | ICD-10-CM | POA: Insufficient documentation

## 2018-07-19 DIAGNOSIS — K529 Noninfective gastroenteritis and colitis, unspecified: Secondary | ICD-10-CM | POA: Insufficient documentation

## 2018-07-19 DIAGNOSIS — M7989 Other specified soft tissue disorders: Secondary | ICD-10-CM | POA: Insufficient documentation

## 2018-07-19 DIAGNOSIS — R918 Other nonspecific abnormal finding of lung field: Secondary | ICD-10-CM | POA: Insufficient documentation

## 2018-07-19 DIAGNOSIS — E876 Hypokalemia: Secondary | ICD-10-CM | POA: Insufficient documentation

## 2018-07-23 ENCOUNTER — Inpatient Hospital Stay (HOSPITAL_BASED_OUTPATIENT_CLINIC_OR_DEPARTMENT_OTHER): Payer: Medicaid Other | Admitting: Nurse Practitioner

## 2018-07-23 ENCOUNTER — Telehealth: Payer: Self-pay

## 2018-07-23 ENCOUNTER — Other Ambulatory Visit: Payer: Self-pay | Admitting: Medical

## 2018-07-23 ENCOUNTER — Inpatient Hospital Stay: Payer: Medicaid Other

## 2018-07-23 ENCOUNTER — Other Ambulatory Visit: Payer: Self-pay

## 2018-07-23 ENCOUNTER — Encounter: Payer: Self-pay | Admitting: Nurse Practitioner

## 2018-07-23 ENCOUNTER — Ambulatory Visit: Payer: Medicaid Other

## 2018-07-23 VITALS — BP 119/83 | HR 89 | Temp 98.7°F | Resp 18 | Wt 101.8 lb

## 2018-07-23 DIAGNOSIS — R110 Nausea: Secondary | ICD-10-CM

## 2018-07-23 DIAGNOSIS — I700 Atherosclerosis of aorta: Secondary | ICD-10-CM | POA: Diagnosis not present

## 2018-07-23 DIAGNOSIS — Z79899 Other long term (current) drug therapy: Secondary | ICD-10-CM | POA: Diagnosis not present

## 2018-07-23 DIAGNOSIS — M7989 Other specified soft tissue disorders: Secondary | ICD-10-CM | POA: Diagnosis not present

## 2018-07-23 DIAGNOSIS — I2510 Atherosclerotic heart disease of native coronary artery without angina pectoris: Secondary | ICD-10-CM | POA: Diagnosis not present

## 2018-07-23 DIAGNOSIS — R918 Other nonspecific abnormal finding of lung field: Secondary | ICD-10-CM | POA: Diagnosis not present

## 2018-07-23 DIAGNOSIS — C257 Malignant neoplasm of other parts of pancreas: Secondary | ICD-10-CM

## 2018-07-23 DIAGNOSIS — E876 Hypokalemia: Secondary | ICD-10-CM | POA: Diagnosis not present

## 2018-07-23 DIAGNOSIS — K529 Noninfective gastroenteritis and colitis, unspecified: Secondary | ICD-10-CM

## 2018-07-23 DIAGNOSIS — Z801 Family history of malignant neoplasm of trachea, bronchus and lung: Secondary | ICD-10-CM

## 2018-07-23 DIAGNOSIS — Z8042 Family history of malignant neoplasm of prostate: Secondary | ICD-10-CM

## 2018-07-23 DIAGNOSIS — R600 Localized edema: Secondary | ICD-10-CM

## 2018-07-23 DIAGNOSIS — F17210 Nicotine dependence, cigarettes, uncomplicated: Secondary | ICD-10-CM | POA: Diagnosis not present

## 2018-07-23 DIAGNOSIS — R55 Syncope and collapse: Secondary | ICD-10-CM | POA: Diagnosis not present

## 2018-07-23 DIAGNOSIS — D563 Thalassemia minor: Secondary | ICD-10-CM | POA: Diagnosis not present

## 2018-07-23 DIAGNOSIS — C250 Malignant neoplasm of head of pancreas: Secondary | ICD-10-CM

## 2018-07-23 DIAGNOSIS — D6481 Anemia due to antineoplastic chemotherapy: Secondary | ICD-10-CM | POA: Diagnosis not present

## 2018-07-23 LAB — CMP (CANCER CENTER ONLY)
ALT: 39 U/L (ref 0–44)
AST: 72 U/L — ABNORMAL HIGH (ref 15–41)
Albumin: 2.7 g/dL — ABNORMAL LOW (ref 3.5–5.0)
Alkaline Phosphatase: 131 U/L — ABNORMAL HIGH (ref 38–126)
Anion gap: 8 (ref 5–15)
BUN: 5 mg/dL — ABNORMAL LOW (ref 6–20)
CO2: 24 mmol/L (ref 22–32)
Calcium: 7.7 mg/dL — ABNORMAL LOW (ref 8.9–10.3)
Chloride: 104 mmol/L (ref 98–111)
Creatinine: 0.58 mg/dL (ref 0.44–1.00)
GFR, Est AFR Am: >60 mL/min (ref >60)
GFR, Estimated: >60 mL/min (ref >60)
Glucose, Bld: 72 mg/dL (ref 70–99)
Potassium: 4.2 mmol/L (ref 3.5–5.1)
Sodium: 136 mmol/L (ref 135–145)
Total Bilirubin: 0.8 mg/dL (ref 0.3–1.2)
Total Protein: 5.9 g/dL — ABNORMAL LOW (ref 6.5–8.1)

## 2018-07-23 MED ORDER — SODIUM CHLORIDE 0.9 % IV SOLN
Freq: Once | INTRAVENOUS | Status: AC
  Administered 2018-07-23: 15:00:00 via INTRAVENOUS
  Filled 2018-07-23: qty 250

## 2018-07-23 MED ORDER — MORPHINE SULFATE 15 MG PO TABS: 15.0000 mg | ORAL_TABLET | ORAL | 0 refills | Status: DC | PRN

## 2018-07-23 MED ORDER — ONDANSETRON HCL 4 MG/2ML IJ SOLN
8.0000 mg | Freq: Once | INTRAMUSCULAR | Status: AC
  Administered 2018-07-23: 8 mg via INTRAVENOUS

## 2018-07-23 MED ORDER — ONDANSETRON HCL 4 MG/2ML IJ SOLN
INTRAMUSCULAR | Status: AC
  Filled 2018-07-23: qty 4

## 2018-07-23 MED ORDER — SODIUM CHLORIDE 0.9% FLUSH
10.0000 mL | INTRAVENOUS | Status: DC | PRN
  Filled 2018-07-23: qty 10

## 2018-07-23 MED ORDER — HEPARIN SOD (PORK) LOCK FLUSH 100 UNIT/ML IV SOLN
500.0000 [IU] | Freq: Once | INTRAVENOUS | Status: AC | PRN
  Administered 2018-07-23: 500 [IU]
  Filled 2018-07-23: qty 5

## 2018-07-23 MED ORDER — METOCLOPRAMIDE HCL 5 MG PO TABS: 5.0000 mg | ORAL_TABLET | Freq: Four times a day (QID) | ORAL | 1 refills | Status: DC | PRN

## 2018-07-23 MED ORDER — POTASSIUM CHLORIDE CRYS ER 20 MEQ PO TBCR: 20.0000 meq | EXTENDED_RELEASE_TABLET | Freq: Every day | ORAL | 1 refills | Status: DC

## 2018-07-23 MED ORDER — SODIUM CHLORIDE 0.9 % IV SOLN: 8.0000 mg | Freq: Once | INTRAVENOUS | Status: DC

## 2018-07-23 MED FILL — POTASSIUM CHLORIDE CRYS ER: 20 | 30 days supply | Qty: 30 | Fill #0

## 2018-07-23 MED FILL — METOCLOPRAMIDE 5 MG TABLET: 5 | 15 days supply | Qty: 60 | Fill #0

## 2018-07-23 MED FILL — MORPHINE SULFATE IR 15 MG T: 15 | 10 days supply | Qty: 60 | Fill #0

## 2018-07-23 NOTE — Progress Notes (Signed)
[place]   [phone] [phone]   Clinic Follow up Note   Patient Care Team: Patient, No Pcp Per as PCP - General ([place]) Milus Banister, MD as Attending Physician (Gastroenterology) Truitt Merle, MD as Consulting Physician (Hematology) 07/23/2018  CHIEF COMPLAINT: symptom management visit for nausea   SUMMARY OF ONCOLOGIC HISTORY: Oncology History Overview Note  Cancer Staging Pancreatic cancer [facility]) Staging form: Exocrine Pancreas, AJCC 8th Edition - Clinical stage from 11/08/2017: Stage IB (cT2, cN0, cM0) - Signed by Truitt Merle, MD on 11/09/2017     Pancreatic cancer ([place])  10/30/2017 Imaging   MRI Abdomen 10/30/17  IMPRESSION: 1. Marked intra and extrahepatic biliary duct dilatation with abrupt cut off of the common bile duct at the pancreatic head. This is associated with marked dilatation of the main pancreatic duct also with an abrupt cut off at the level of the pancreatic head. Pancreatic parenchyma in the body and tail of pancreas is markedly atrophic. A subtle 2.5 x 3 cm lesion with differential signal intensity is identified in the head of the pancreas which appears slightly hypoenhancing on postcontrast imaging. Together, these features are highly concerning for pancreatic adenocarcinoma. EUS is recommended to further evaluate. 2. Portal vein is patent although there is some mass-effect on the portal splenic confluence and IVC. Fat planes around celiac axis and SMA appear preserved. 3. Upper normal to borderline portal caval lymph node associated with small right para-aortic lymphadenopathy.     10/30/2017 Tumor Marker   Baseline Ca 19-9 at 140   10/31/2017 Procedure   ERCP by Dr. Lyndel Safe 10/31/17  IMPRESSION Malignant appearing CBD stricture (due to pancreatic mass) status post sphincterotomy and stent insertion.   11/08/2017 Cancer Staging   Staging form: Exocrine Pancreas, AJCC 8th Edition - Clinical  stage from 11/08/2017: Stage IB (cT2, cN0, cM0) - Signed by Truitt Merle, MD on 11/09/2017   11/08/2017 Procedure   EUS and EGD by Dr. Ardis Hughs 11/08/17  IMPRESSION -Very vague, irregularly bordered, approximately 2.6cm mass was found in the head of pancreas. This causes biliary and pancreatic duct obstruction. The previously plastic biliary stent is in good position. The mass directly abuts and attenuates the PV/SMV confluence, suggesting invasion. FNA performed and the preliminary cytology review is positive for maligancy (adenocarcinoma). - [place] [facility] office will arrange referrals to medical and surgical oncology. - Case to be discussed at upcoming multidisciplinary GI tumor conference.   11/08/2017 Initial Biopsy   Biopsy Cytology 11/08/17 Diagnosis FINE NEEDLE ASPIRATION, ENDOSCOPIC, PANCREAS HEAD (SPECIMEN 1 OF 1 COLLECTED 11/08/17): MALIGNANT CELLS CONSISTENT WITH ADENOCARCINOMA.   11/09/2017 Initial Diagnosis   Pancreatic cancer ([place])   11/15/2017 Imaging   CT Chest and Pelvis WO Contrast 11/15/17 IMPRESSION: 1. No findings of active malignancy in the chest or pelvis. 2. Aortic Atherosclerosis (ICD10-I70.0). Coronary atherosclerosis. 3. Faint centrilobular nodularity in the lung apices, raising the possibility of hypersensitivity pneumonitis or less likely respiratory bronchiolitis. Airway thickening is present, suggesting bronchitis or reactive airways disease.   11/19/2017 -  Chemotherapy   FOLFIRINOX q2weeks with Neulasta injection for 4 months starting 11/19/17. Completed 8 cycles on 03/07/18    02/04/2018 Imaging   CT AP W Contrast 02/04/18  IMPRESSION: 1. No substantial change in size of mass involving head of pancreas. A aortocaval lymph node which appears increased in the interval measuring 1.1 cm versus 0.8 cm previously. Stable 1.2 cm peripancreatic node. 2. Dilated proximal common bile duct is mildly increased in caliber compared with 12/12/2017.  The common bile  duct stent remains in place in appears to be in appropriate position.    04/19/2018 Imaging   CT AP 04/19/18  IMPRESSION: 1. Essentially stable exam compared to most recent CT scans of 02/04/2018 and 12/12/2017. 2. Stable mass in the head of the pancreas with duct dilatation and atrophy upstream. Mass continues to surround the GDA and contacts the portal vein but is free of the celiac trunk and SMA. Mass probably contacts the RIGHT hepatic artery at the takeoff of the GDA (image 56/5). Dominant LEFT hepatic artery originates from the LEFT gastric. 3. Mild intrahepatic and extrahepatic duct dilatation similar to prior with flexible stent in the common bile duct. 4. No new or progressive disease. 5. No peritoneal metastasis.   04/25/2018 Imaging   CT Angio chest 04/25/18  IMPRESSION: 1. No demonstrable pulmonary embolus. No thoracic aortic aneurysm or dissection. 2. Areas of atelectatic change bilaterally. No consolidation. No pneumothorax. Endotracheal tube tip is slightly superior to the carina. 3. Enlarged subcarinal lymph node, concerning for neoplastic etiology. 4. 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 Biliary stent present. Biliary duct air is felt to be secondary to the stent placement. 5. Mild upper abdominal ascites. Pneumoperitoneum with apparent drainage catheter present. The pneumoperitoneum is likely of post procedural/postoperative etiology. Bowel perforation in this circumstance cannot be excluded entirely, however. Close clinical assessment in this regard advised. 6.  Left ventricular hypertrophy.   04/25/2018 Surgery   LAPAROSCOPY DIAGNOSTIC and WHIPPLE PROCEDURE, RECONSTRUCTION OF PORTAL VEIN by Dr. Barry Dienes, Dr. Marcello Moores 04/25/18    04/25/2018 Pathology Results    Diagnosis 04/25/18 1. Whipple procedure/resection, with gallbladder with segment of portal vein - INVASIVE ADENOCARCINOMA, MODERATELY DIFFERENTIATED, SPANNING 3.5 CM. - ADENOCARCINOMA IS  PRESENT AT THE SUPERIOR MARGIN OF THE ATTACHED VEIN AND THE INFERIOR MARGIN OF THE ATTACHED VEIN (BLOCKS E AND F). - ADENOCARCINOMA EXTENDS INTO PERIPANCREATIC SOFT TISSUE. - PERINEURAL INVASION IS IDENTIFIED, DIFFUSE. - METASTATIC ADENOCARCINOMA IN 1 OF 24 LYMPH NODES (1/24). - SEE ONCOLOGY TABLE BELOW. 2. Lymph node, biopsy, portal - THERE IS NO EVIDENCE OF CARCINOMA IN 5 OF 5 LYMPH NODES (0/5). 3. Pancreas, biopsy, additional pancreatic margin - HIGH GRADE GLANDULAR DYSPLASIA, FOCAL    05/09/2018 Imaging   CT AP W Contrast 05/09/18 IMPRESSION: Status post Whipple's procedure. 1 surgical drain remains in the area. Pancreatic duct stent is noted.   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 It appears to be stable in size compared to prior exam.   Wall and fold thickening is seen involving right and transverse colon which may represent edema or possibly inflammation.   Aortic Atherosclerosis (ICD10-I70.0).   06/15/2018 Imaging   CT AP W Contrast 06/15/18 IMPRESSION: Changes of prior Whipple procedure. Previously seen pancreatic duct stent presumably has migrated across the choledocho-jejunostomy and is now located in the biliary ducts. No biliary or pancreatic ductal dilatation.   Severe diffuse low-density throughout the liver, likely fatty infiltration.   Wall thickening within the ascending colon and hepatic flexure concerning for colitis.   Small amount of free fluid in the pelvis.   Aortic atherosclerosis.     CURRENT THERAPY: Supportive care  INTERVAL HISTORY: Patient was seen in the infusion room at request of RN for nausea and vomiting. Patient was last seen by [facility] 07/12/18. She has required frequent IVF support but recently cancelled infusion last week because  she was feeling better. Over this weekend she developed more fatigue with nausea and vomiting. She  vomited 3 times yesterday, none today. Zofran helps. Overall her n/v is getting better. She has constant pain in upper and lower abdomen. Takes MS contin and morphine which helps. Pain is 5-6/10. She is out of short acting. Stools are solid lately, no blood. She does note she has a lot of gas lately. Urine is normal color. She has occasional chill but no fever. 2 weeks ago she had an enlarged and tender left lower leg that improved but remains slightly larger than right leg. No h/o DVT. She notes she is mostly sedentary. Still not eating much, mostly just soup broth.   Otherwise, she denies cough, chest pain, dyspnea.     MEDICAL HISTORY:  Past Medical History:  Diagnosis Date  . Family history of lung cancer   . Family history of prostate cancer   . Family history of uterine cancer   . Gallstones 10/2017  . Pancreatic cancer ([place])   . Pneumothorax, closed, traumatic    years ago  . PONV (postoperative nausea and vomiting)     SURGICAL HISTORY: Past Surgical History:  Procedure Laterality Date  . BILIARY STENT PLACEMENT  10/31/2017   Procedure: BILIARY STENT PLACEMENT;  Surgeon: [name], MD;  Location: [facility] ENDOSCOPY;  Service: Gastroenterology;;  . ERCP N/A 10/31/2017   Procedure: ENDOSCOPIC RETROGRADE CHOLANGIOPANCREATOGRAPHY (ERCP);  Surgeon: [name], MD;  Location[facility] ENDOSCOPY;  Service: Gastroenterology;  Laterality: N/A;  . ESOPHAGOGASTRODUODENOSCOPY N/A 11/08/2017   Procedure: ESOPHAGOGASTRODUODENOSCOPY (EGD);  Surgeon: Milus Banister, MD;  Location: [facility] ENDOSCOPY;  Service: Endoscopy;  Laterality: N/A;  . EUS N/A 11/08/2017   Procedure: UPPER ENDOSCOPIC ULTRASOUND (EUS) RADIAL;  Surgeon: Milus Banister, MD;  Location: [facility] ENDOSCOPY;  Service: Endoscopy;  Laterality: N/A;  . FINE NEEDLE ASPIRATION  11/08/2017   Procedure: FINE NEEDLE ASPIRATION;  Surgeon: Milus Banister, MD;  Location: [facility] ENDOSCOPY;  Service: Endoscopy;;  . IR IMAGING GUIDED PORT INSERTION   11/15/2017  . LAPAROSCOPY N/A 04/25/2018   Procedure: LAPAROSCOPY DIAGNOSTIC;  Surgeon: Stark Klein, MD;  Location: [place];  Service: General;  Laterality: N/A;  GENERAL AND EPIDURAL ANESTHESIA  . SPHINCTEROTOMY  10/31/2017   Procedure: SPHINCTEROTOMY;  Surgeon: [name], MD;  Location: [facility] ENDOSCOPY;  Service: Gastroenterology;;  . TONSILLECTOMY    . WHIPPLE PROCEDURE N/A 04/25/2018   Procedure: WHIPPLE PROCEDURE, RECONSTRUCTION OF PORTAL VEIN;  Surgeon: Stark Klein, MD;  Location: [place];  Service: General;  Laterality: N/A;    I have reviewed the social history and family history with the patient and they are unchanged from previous note.  ALLERGIES:  is allergic to latex.  MEDICATIONS:  Current Outpatient Medications  Medication Sig Dispense Refill  . acetaminophen (TYLENOL) 325 MG tablet Take 1-2 tablets (325-650 mg total) by mouth every 4 (four) hours as needed for mild pain.    . cyclobenzaprine (FLEXERIL) 5 MG tablet Take 1 tablet (5 mg total) by mouth 3 (three) times daily as needed for muscle spasms. 60 tablet 1  . metoCLOPramide (REGLAN) 5 MG tablet Take 1 tablet (5 mg total) by mouth every 6 (six) hours as needed for nausea or vomiting. 60 tablet 1  . morphine (MS CONTIN) 15 MG 12 hr tablet Take 1 tablet (15 mg total) by mouth every 12 (twelve) hours. 60 tablet 0  . morphine (MSIR) 15 MG tablet Take 1 tablet (15 mg total) by mouth every 4 (four)  hours as needed for severe pain. 60 tablet 0  . ondansetron (ZOFRAN) 8 MG tablet Take 1 tablet (8 mg total) by mouth every 8 (eight) hours as needed for nausea or vomiting. 30 tablet 0  . pantoprazole (PROTONIX) 40 MG tablet Take 1 tablet (40 mg total) by mouth daily. 60 tablet 3  . potassium chloride SA (K-DUR) 20 MEQ tablet Take 1 tablet (20 mEq total) by mouth daily. 30 tablet 1  . promethazine (PHENERGAN) 25 MG tablet Take 1 tablet (25 mg total) by mouth every 6 (six) hours as needed for nausea or vomiting. 30 tablet 0   No  current facility-administered medications for this visit.    Facility-Administered Medications Ordered in Other Visits  Medication Dose Route Frequency Provider Last Rate Last Dose  . heparin lock flush 100 unit/mL  500 Units Intracatheter Once PRN Truitt Merle, MD      . sodium chloride flush (NS) 0.9 % injection 10 mL  10 mL Intracatheter PRN Truitt Merle, MD        PHYSICAL EXAMINATION: ECOG PERFORMANCE STATUS: 2-3  There were no vitals filed for this visit. There were no vitals filed for this visit.  GENERAL:alert, no distress and comfortable SKIN: no obvious rash  EYES: sclera clear LUNGS: respirations even and unlabored  HEART: trace edema LLE, slightly larger than RLE ABDOMEN: abdomen soft, flat with vertical incision that is well healed. Midline tenderness in upper and lower abdomen. Normal bowel sounds Musculoskeletal:no cyanosis of digits NEURO: alert & oriented x 3 with fluent speech PAC without erythema   LABORATORY DATA:  I have reviewed the data as listed CBC Latest Ref Rng & Units 07/08/2018 07/07/2018 07/06/2018  WBC 4.0 - 10.5 K/uL 8.4 7.5 6.5  Hemoglobin 12.0 - 15.0 g/dL 9.9(L) 11.1(L) 11.0(L)  Hematocrit 36.0 - 46.0 % 29.9(L) 34.0(L) 34.0(L)  Platelets 150 - 400 K/uL 200 207 227     CMP Latest Ref Rng & Units 07/08/2018 07/07/2018 07/06/2018  Glucose 70 - 99 mg/dL 101(H) 90 140(H)  BUN 6 - 20 mg/dL <4(L) <5(L) <5(L)  Creatinine 0.44 - 1.00 mg/dL 0.60 0.57 0.69  Sodium 135 - 145 mmol/L 137 135 137  Potassium 3.5 - 5.1 mmol/L 3.0(LL) 3.4(L) 3.3(L)  Chloride 98 - 111 mmol/L 103 103 103  CO2 22 - 32 mmol/L 24 24 23   Calcium 8.9 - 10.3 mg/dL 7.6(L) 8.6(L) 8.7(L)  Total Protein 6.5 - 8.1 g/dL 5.9(L) 6.3(L) 6.6  Total Bilirubin 0.3 - 1.2 mg/dL 0.7 0.9 0.9  Alkaline Phos 38 - 126 U/L 93 103 100  AST 15 - 41 U/L 51(H) 43(H) 31  ALT 0 - 44 U/L 29 29 28       RADIOGRAPHIC STUDIES: I have personally reviewed the radiological images as listed and agreed with the findings  in the report. No results found.   ASSESSMENT & PLAN: Jane Brooks is a 48 y.o. female with   1. 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  2. Abdominal pain, recent colitis, n/v, flatulence  3. Recent LLE edema 4. Hypokalemia - secondary to GI loss   Ms. Reitman appears stable. She continues to require IVF to support ongoing issues with nutrition and n/v. GI symptoms are slowly improving. Continue zofran. I recommend to restart reglan PRN, I will refill. She is seeing GI Dr. Ardis Hughs on 08/13/18.  Abdominal pain is stable, well managed on current regimen, I will refill MSIR which she takes q4-6 hours. Rarely requires 2 tabs if pain is severe. She can try OTC gas-X for flatulence. She noticed left leg edema lately that has improved, still slightly larger than right leg. She wears compression stocking. Given recent abdominal surgery and sedentary lifestyle lately, will obtain doppler study to r/o DVT. She will return for PET scan on 7/9 and MD f/u and IVF on 7/10. Patient agrees with the plan.   She is requesting potassium refill, last K 3.3 on 6/22 patient has been taking TID. K normal now on stat CMP, but given she is still having intermittent vomiting I recommend to continue once daily. Refilled.   PLAN: -CMP reviewed -Continue oral K; reduce to once daily, refilled -Continue current pain regimen, MS contin 15 mg q12 and MSIR 15 mg q4h PRN, refilled MSIR -Restart Reglan for GI symptoms, refilled  -Doppler to r/o LLE DVT this week  -PET 7/9 -MD f/u and IVF 7/10 -GI f/u with Dr. Ardis Hughs 7/28    Orders Placed This Encounter  Procedures  . CMP ([place] only)    Send tube to infusion RN to draw    Standing Status:   Future    Number of Occurrences:   1    Standing Expiration Date:   07/23/2019    All questions were answered. The patient knows to call the clinic with any problems, questions or concerns. No barriers to learning was detected.     Alla Feeling, NP 07/23/18

## 2018-07-23 NOTE — Telephone Encounter (Signed)
TC to per Lacie to Vascular lab [phone]) to schedule doppler for patients left leg but no answer. Left note on desk for nurse T to call and schedule an appoint for patient tomorrow.

## 2018-07-23 NOTE — Telephone Encounter (Signed)
Per Lacie Scheduled doppler appointment for patient Thursday 07/25/18 @ 9:00am patient aware of appointment date and time.

## 2018-07-23 NOTE — Patient Instructions (Signed)

## 2018-07-24 ENCOUNTER — Telehealth: Payer: Self-pay | Admitting: Nurse Practitioner

## 2018-07-24 NOTE — Telephone Encounter (Signed)
No los per 7/7.

## 2018-07-25 ENCOUNTER — Ambulatory Visit (HOSPITAL_COMMUNITY)
Admission: RE | Admit: 2018-07-25 | Discharge: 2018-07-25 | Disposition: A | Discharge status: Home / Self Care | Payer: Medicaid Other | Source: Ambulatory Visit | Attending: Nurse Practitioner | Admitting: Nurse Practitioner

## 2018-07-25 ENCOUNTER — Other Ambulatory Visit: Payer: Self-pay

## 2018-07-25 ENCOUNTER — Encounter (HOSPITAL_COMMUNITY)
Admission: RE | Admit: 2018-07-25 | Discharge: 2018-07-25 | Disposition: A | Discharge status: Home / Self Care | Payer: Medicaid Other | Source: Ambulatory Visit | Attending: Hematology | Admitting: Hematology

## 2018-07-25 DIAGNOSIS — C257 Malignant neoplasm of other parts of pancreas: Secondary | ICD-10-CM

## 2018-07-25 DIAGNOSIS — C250 Malignant neoplasm of head of pancreas: Secondary | ICD-10-CM | POA: Insufficient documentation

## 2018-07-25 LAB — GLUCOSE, CAPILLARY: Glucose-Capillary: 82 mg/dL (ref 70–99)

## 2018-07-25 MED ORDER — FLUDEOXYGLUCOSE F - 18 (FDG) INJECTION
5.0000 | Freq: Once | INTRAVENOUS | Status: AC | PRN
  Administered 2018-07-25: 5 via INTRAVENOUS

## 2018-07-25 NOTE — Progress Notes (Signed)
Lower extremity venous has been completed.   Preliminary results in CV Proc.   Abram Sander 07/25/2018 9:01 AM

## 2018-07-26 ENCOUNTER — Inpatient Hospital Stay: Payer: Medicaid Other

## 2018-07-26 ENCOUNTER — Inpatient Hospital Stay (HOSPITAL_BASED_OUTPATIENT_CLINIC_OR_DEPARTMENT_OTHER): Payer: Medicaid Other | Admitting: Hematology

## 2018-07-26 ENCOUNTER — Telehealth: Payer: Self-pay | Admitting: *Deleted

## 2018-07-26 ENCOUNTER — Encounter: Payer: Self-pay | Admitting: Hematology

## 2018-07-26 DIAGNOSIS — I10 Essential (primary) hypertension: Secondary | ICD-10-CM

## 2018-07-26 DIAGNOSIS — C257 Malignant neoplasm of other parts of pancreas: Secondary | ICD-10-CM | POA: Diagnosis not present

## 2018-07-26 DIAGNOSIS — Z79899 Other long term (current) drug therapy: Secondary | ICD-10-CM | POA: Diagnosis not present

## 2018-07-26 DIAGNOSIS — Z9221 Personal history of antineoplastic chemotherapy: Secondary | ICD-10-CM | POA: Diagnosis not present

## 2018-07-26 NOTE — Telephone Encounter (Signed)
Called & informed pt that doppler was negative for DVT & to cont to elevate legs & wear compression stockings per Regan Rakers Burton's PA request.

## 2018-07-26 NOTE — Telephone Encounter (Signed)
-----   Message from Alla Feeling, NP sent at 07/25/2018  5:26 PM EDT ----- Please let her know doppler is negative for DVT. Continue to elevate legs, wear compression stockings.  Thanks, Regan Rakers NP

## 2018-07-30 ENCOUNTER — Other Ambulatory Visit: Payer: Self-pay | Admitting: Radiology

## 2018-07-30 ENCOUNTER — Telehealth: Payer: Self-pay | Admitting: Hematology

## 2018-07-30 NOTE — Telephone Encounter (Signed)
Scheduled appt per 7/10 sch message - pt aware of appt date and time

## 2018-07-31 ENCOUNTER — Other Ambulatory Visit: Payer: Self-pay | Admitting: Radiology

## 2018-07-31 ENCOUNTER — Other Ambulatory Visit: Payer: Self-pay | Admitting: Student

## 2018-08-01 ENCOUNTER — Other Ambulatory Visit: Payer: Self-pay | Admitting: Radiology

## 2018-08-01 ENCOUNTER — Ambulatory Visit (HOSPITAL_COMMUNITY): Admission: RE | Admit: 2018-08-01 | Payer: Medicaid Other | Source: Ambulatory Visit

## 2018-08-01 ENCOUNTER — Encounter (HOSPITAL_COMMUNITY): Payer: Self-pay

## 2018-08-02 ENCOUNTER — Telehealth: Payer: Self-pay

## 2018-08-02 ENCOUNTER — Other Ambulatory Visit: Payer: Self-pay | Admitting: Medical

## 2018-08-02 ENCOUNTER — Emergency Department (HOSPITAL_COMMUNITY)
Admission: EM | Admit: 2018-08-02 | Discharge: 2018-08-02 | Disposition: A | Discharge status: Home / Self Care | Payer: Medicaid Other | Attending: Emergency Medicine | Admitting: Emergency Medicine

## 2018-08-02 ENCOUNTER — Encounter (HOSPITAL_COMMUNITY): Payer: Self-pay | Admitting: Emergency Medicine

## 2018-08-02 ENCOUNTER — Ambulatory Visit (HOSPITAL_COMMUNITY)
Admission: RE | Admit: 2018-08-02 | Discharge: 2018-08-02 | Disposition: A | Discharge status: Home / Self Care | Payer: Medicaid Other | Source: Ambulatory Visit | Attending: Hematology | Admitting: Hematology

## 2018-08-02 ENCOUNTER — Other Ambulatory Visit: Payer: Self-pay

## 2018-08-02 ENCOUNTER — Encounter (HOSPITAL_COMMUNITY): Payer: Self-pay

## 2018-08-02 DIAGNOSIS — C257 Malignant neoplasm of other parts of pancreas: Secondary | ICD-10-CM

## 2018-08-02 DIAGNOSIS — Z87891 Personal history of nicotine dependence: Secondary | ICD-10-CM | POA: Insufficient documentation

## 2018-08-02 DIAGNOSIS — I10 Essential (primary) hypertension: Secondary | ICD-10-CM | POA: Diagnosis not present

## 2018-08-02 DIAGNOSIS — R112 Nausea with vomiting, unspecified: Secondary | ICD-10-CM | POA: Insufficient documentation

## 2018-08-02 DIAGNOSIS — Z79899 Other long term (current) drug therapy: Secondary | ICD-10-CM | POA: Insufficient documentation

## 2018-08-02 DIAGNOSIS — R1030 Lower abdominal pain, unspecified: Secondary | ICD-10-CM | POA: Diagnosis present

## 2018-08-02 DIAGNOSIS — Z91040 Latex allergy status: Secondary | ICD-10-CM | POA: Insufficient documentation

## 2018-08-02 LAB — CBC WITH DIFFERENTIAL/PLATELET
Abs Immature Granulocytes: 0.04 10*3/uL (ref 0.00–0.07)
Basophils Absolute: 0 10*3/uL (ref 0.0–0.1)
Basophils Relative: 1 %
Eosinophils Absolute: 0 10*3/uL (ref 0.0–0.5)
Eosinophils Relative: 0 %
HCT: 36.6 % (ref 36.0–46.0)
Hemoglobin: 11.7 g/dL — ABNORMAL LOW (ref 12.0–15.0)
Immature Granulocytes: 1 %
Lymphocytes Relative: 14 %
Lymphs Abs: 0.9 10*3/uL (ref 0.7–4.0)
MCH: 21 pg — ABNORMAL LOW (ref 26.0–34.0)
MCHC: 32 g/dL (ref 30.0–36.0)
MCV: 65.8 fL — ABNORMAL LOW (ref 80.0–100.0)
Monocytes Absolute: 0.3 10*3/uL (ref 0.1–1.0)
Monocytes Relative: 4 %
Neutro Abs: 5.1 10*3/uL (ref 1.7–7.7)
Neutrophils Relative %: 80 %
Platelets: 254 10*3/uL (ref 150–400)
RBC: 5.56 MIL/uL — ABNORMAL HIGH (ref 3.87–5.11)
RDW: 19.6 % — ABNORMAL HIGH (ref 11.5–15.5)
WBC: 6.3 10*3/uL (ref 4.0–10.5)
nRBC: 0 % (ref 0.0–0.2)

## 2018-08-02 LAB — PROTIME-INR
INR: 1 (ref 0.8–1.2)
INR: 1.1 (ref 0.8–1.2)
Prothrombin Time: 13.3 seconds (ref 11.4–15.2)
Prothrombin Time: 13.6 seconds (ref 11.4–15.2)

## 2018-08-02 LAB — URINALYSIS, ROUTINE W REFLEX MICROSCOPIC
Bilirubin Urine: NEGATIVE
Glucose, UA: NEGATIVE mg/dL
Hgb urine dipstick: NEGATIVE
Ketones, ur: 5 mg/dL — AB
Leukocytes,Ua: NEGATIVE
Nitrite: NEGATIVE
Protein, ur: NEGATIVE mg/dL
Specific Gravity, Urine: 1.019 (ref 1.005–1.030)
pH: 5 (ref 5.0–8.0)

## 2018-08-02 LAB — COMPREHENSIVE METABOLIC PANEL
ALT: 45 U/L — ABNORMAL HIGH (ref 0–44)
AST: 62 U/L — ABNORMAL HIGH (ref 15–41)
Albumin: 3.3 g/dL — ABNORMAL LOW (ref 3.5–5.0)
Alkaline Phosphatase: 124 U/L (ref 38–126)
Anion gap: 10 (ref 5–15)
BUN: 5 mg/dL — ABNORMAL LOW (ref 6–20)
CO2: 24 mmol/L (ref 22–32)
Calcium: 8.8 mg/dL — ABNORMAL LOW (ref 8.9–10.3)
Chloride: 105 mmol/L (ref 98–111)
Creatinine, Ser: 0.58 mg/dL (ref 0.44–1.00)
GFR calc Af Amer: >60 mL/min (ref >60)
GFR calc non Af Amer: >60 mL/min (ref >60)
Glucose, Bld: 131 mg/dL — ABNORMAL HIGH (ref 70–99)
Potassium: 3.6 mmol/L (ref 3.5–5.1)
Sodium: 139 mmol/L (ref 135–145)
Total Bilirubin: 0.7 mg/dL (ref 0.3–1.2)
Total Protein: 6.6 g/dL (ref 6.5–8.1)

## 2018-08-02 LAB — LIPASE, BLOOD: Lipase: 19 U/L (ref 11–51)

## 2018-08-02 MED ORDER — HYDROMORPHONE HCL 1 MG/ML IJ SOLN
2.0000 mg | Freq: Once | INTRAMUSCULAR | Status: AC
  Administered 2018-08-02: 09:00:00 2 mg via INTRAVENOUS
  Filled 2018-08-02: qty 2

## 2018-08-02 MED ORDER — ONDANSETRON HCL 4 MG/2ML IJ SOLN
4.0000 mg | Freq: Once | INTRAMUSCULAR | Status: AC
  Administered 2018-08-02: 4 mg via INTRAVENOUS
  Filled 2018-08-02: qty 2

## 2018-08-02 MED ORDER — SODIUM CHLORIDE 0.9 % IV BOLUS
1000.0000 mL | Freq: Once | INTRAVENOUS | Status: AC
  Administered 2018-08-02: 08:00:00 1000 mL via INTRAVENOUS

## 2018-08-02 MED ORDER — HYDROMORPHONE HCL 1 MG/ML IJ SOLN
1.0000 mg | Freq: Once | INTRAMUSCULAR | Status: AC
  Administered 2018-08-02: 08:00:00 1 mg via INTRAVENOUS
  Filled 2018-08-02: qty 1

## 2018-08-02 MED ORDER — ONDANSETRON HCL 4 MG/2ML IJ SOLN
4.0000 mg | Freq: Once | INTRAMUSCULAR | Status: AC
  Administered 2018-08-02: 08:00:00 4 mg via INTRAVENOUS
  Filled 2018-08-02: qty 2

## 2018-08-02 MED ORDER — SODIUM CHLORIDE 0.9 % IV SOLN: INTRAVENOUS | Status: DC

## 2018-08-02 MED ORDER — CYCLOBENZAPRINE HCL 5 MG PO TABS: 5.0000 mg | ORAL_TABLET | Freq: Three times a day (TID) | ORAL | 1 refills | Status: DC | PRN

## 2018-08-02 MED ORDER — MORPHINE SULFATE ER 15 MG PO TBCR: 15.0000 mg | EXTENDED_RELEASE_TABLET | Freq: Two times a day (BID) | ORAL | 0 refills | Status: DC

## 2018-08-02 MED FILL — CYCLOBENZAPRINE HCL 5 MG TA: 5 | 20 days supply | Qty: 60 | Fill #0

## 2018-08-02 MED FILL — MORPHINE SULF ER 15 MG TAB: 15 | 30 days supply | Qty: 60 | Fill #0

## 2018-08-02 MED FILL — POTASSIUM CHLORIDE CRYS ER: 20 | 30 days supply | Qty: 30 | Fill #0

## 2018-08-02 NOTE — Discharge Instructions (Signed)
Please refer to short stay, and IV has been left in place.  Please follow-up with Dr. Burr Medico as scheduled.

## 2018-08-02 NOTE — ED Triage Notes (Signed)
Pt here with c/o llq abd pain , times 2 days , pt has an appointment today for biopsy , pt has hx of whipple for pancreatic CA

## 2018-08-02 NOTE — Telephone Encounter (Signed)
Patient calls for refills on the following:  MSIR 15 mg last filled 7/7 #60 taking one q 4 hours Flexeril 5 mg last filled 6/22 #60 taking one q 8 hours.  Please send into [facility] pharmacy

## 2018-08-02 NOTE — Progress Notes (Signed)
Client advised needs responsible adult to stay with them tonight and client states ," I do not have anyone to stay with me, get this IV out now, I am leaving"

## 2018-08-02 NOTE — Progress Notes (Signed)
[place]   [phone] [phone]   Clinic Follow up Note   Patient Care Team: Patient, No Pcp Per as PCP - General (General Practice) Milus Banister, MD as Attending Physician (Gastroenterology) Truitt Merle, MD as Consulting Physician (Hematology)  Date of Service:  08/08/2018    CHIEF COMPLAINT: F/u on pancreatic cancer  SUMMARY OF ONCOLOGIC HISTORY: Oncology History Overview Note  Cancer Staging Pancreatic cancer [facility]) Staging form: Exocrine Pancreas, AJCC 8th Edition - Clinical stage from 11/08/2017: Stage IB (cT2, cN0, cM0) - Signed by Truitt Merle, MD on 11/09/2017     Pancreatic cancer ([place])  10/30/2017 Imaging   MRI Abdomen 10/30/17  IMPRESSION: 1. Marked intra and extrahepatic biliary duct dilatation with abrupt cut off of the common bile duct at the pancreatic head. This is associated with marked dilatation of the main pancreatic duct also with an abrupt cut off at the level of the pancreatic head. Pancreatic parenchyma in the body and tail of pancreas is markedly atrophic. A subtle 2.5 x 3 cm lesion with differential signal intensity is identified in the head of the pancreas which appears slightly hypoenhancing on postcontrast imaging. Together, these features are highly concerning for pancreatic adenocarcinoma. EUS is recommended to further evaluate. 2. Portal vein is patent although there is some mass-effect on the portal splenic confluence and IVC. Fat planes around celiac axis and SMA appear preserved. 3. Upper normal to borderline portal caval lymph node associated with small right para-aortic lymphadenopathy.     10/30/2017 Tumor Marker   Baseline Ca 19-9 at 140   10/31/2017 Procedure   ERCP by Dr. Lyndel Safe 10/31/17  IMPRESSION Malignant appearing CBD stricture (due to pancreatic mass) status post sphincterotomy and stent insertion.   11/08/2017 Cancer Staging   Staging form: Exocrine Pancreas, AJCC 8th Edition -  Clinical stage from 11/08/2017: Stage IB (cT2, cN0, cM0) - Signed by Truitt Merle, MD on 11/09/2017   11/08/2017 Procedure   EUS and EGD by Dr. Ardis Hughs 11/08/17  IMPRESSION -Very vague, irregularly bordered, approximately 2.6cm mass was found in the head of pancreas. This causes biliary and pancreatic duct obstruction. The previously plastic biliary stent is in good position. The mass directly abuts and attenuates the PV/SMV confluence, suggesting invasion. FNA performed and the preliminary cytology review is positive for maligancy (adenocarcinoma). - [place] [facility] office will arrange referrals to medical and surgical oncology. - Case to be discussed at upcoming multidisciplinary GI tumor conference.   11/08/2017 Initial Biopsy   Biopsy Cytology 11/08/17 Diagnosis FINE NEEDLE ASPIRATION, ENDOSCOPIC, PANCREAS HEAD (SPECIMEN 1 OF 1 COLLECTED 11/08/17): MALIGNANT CELLS CONSISTENT WITH ADENOCARCINOMA.   11/09/2017 Initial Diagnosis   Pancreatic cancer ([place])   11/15/2017 Imaging   CT Chest and Pelvis WO Contrast 11/15/17 IMPRESSION: 1. No findings of active malignancy in the chest or pelvis. 2. Aortic Atherosclerosis (ICD10-I70.0). Coronary atherosclerosis. 3. Faint centrilobular nodularity in the lung apices, raising the possibility of hypersensitivity pneumonitis or less likely respiratory bronchiolitis. Airway thickening is present, suggesting bronchitis or reactive airways disease.   11/19/2017 -  Chemotherapy   FOLFIRINOX q2weeks with Neulasta injection for 4 months starting 11/19/17. Completed 8 cycles on 03/07/18    02/04/2018 Imaging   CT AP W Contrast 02/04/18  IMPRESSION: 1. No substantial change in size of mass involving head of pancreas. A aortocaval lymph node which appears increased in the interval measuring 1.1 cm versus 0.8 cm previously. Stable 1.2 cm peripancreatic node. 2. Dilated proximal common bile duct is mildly increased  in caliber compared with 12/12/2017. The  common bile duct stent remains in place in appears to be in appropriate position.    04/19/2018 Imaging   CT AP 04/19/18  IMPRESSION: 1. Essentially stable exam compared to most recent CT scans of 02/04/2018 and 12/12/2017. 2. Stable mass in the head of the pancreas with duct dilatation and atrophy upstream. Mass continues to surround the GDA and contacts the portal vein but is free of the celiac trunk and SMA. Mass probably contacts the RIGHT hepatic artery at the takeoff of the GDA (image 56/5). Dominant LEFT hepatic artery originates from the LEFT gastric. 3. Mild intrahepatic and extrahepatic duct dilatation similar to prior with flexible stent in the common bile duct. 4. No new or progressive disease. 5. No peritoneal metastasis.   04/25/2018 Imaging   CT Angio chest 04/25/18  IMPRESSION: 1. No demonstrable pulmonary embolus. No thoracic aortic aneurysm or dissection. 2. Areas of atelectatic change bilaterally. No consolidation. No pneumothorax. Endotracheal tube tip is slightly superior to the carina. 3. Enlarged subcarinal lymph node, concerning for neoplastic etiology. 4. 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 Biliary stent present. Biliary duct air is felt to be secondary to the stent placement. 5. Mild upper abdominal ascites. Pneumoperitoneum with apparent drainage catheter present. The pneumoperitoneum is likely of post procedural/postoperative etiology. Bowel perforation in this circumstance cannot be excluded entirely, however. Close clinical assessment in this regard advised. 6.  Left ventricular hypertrophy.   04/25/2018 Surgery   LAPAROSCOPY DIAGNOSTIC and WHIPPLE PROCEDURE, RECONSTRUCTION OF PORTAL VEIN by Dr. Barry Dienes, Dr. Marcello Moores 04/25/18    04/25/2018 Pathology Results    Diagnosis 04/25/18 1. Whipple procedure/resection, with gallbladder with segment of portal vein - INVASIVE ADENOCARCINOMA, MODERATELY DIFFERENTIATED, SPANNING 3.5 CM. -  ADENOCARCINOMA IS PRESENT AT THE SUPERIOR MARGIN OF THE ATTACHED VEIN AND THE INFERIOR MARGIN OF THE ATTACHED VEIN (BLOCKS E AND F). - ADENOCARCINOMA EXTENDS INTO PERIPANCREATIC SOFT TISSUE. - PERINEURAL INVASION IS IDENTIFIED, DIFFUSE. - METASTATIC ADENOCARCINOMA IN 1 OF 24 LYMPH NODES (1/24). - SEE ONCOLOGY TABLE BELOW. 2. Lymph node, biopsy, portal - THERE IS NO EVIDENCE OF CARCINOMA IN 5 OF 5 LYMPH NODES (0/5). 3. Pancreas, biopsy, additional pancreatic margin - HIGH GRADE GLANDULAR DYSPLASIA, FOCAL    05/09/2018 Imaging   CT AP W Contrast 05/09/18 IMPRESSION: Status post Whipple's procedure. 1 surgical drain remains in the area. Pancreatic duct stent is noted.   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 It appears to be stable in size compared to prior exam.   Wall and fold thickening is seen involving right and transverse colon which may represent edema or possibly inflammation.   Aortic Atherosclerosis (ICD10-I70.0).   06/15/2018 Imaging   CT AP W Contrast 06/15/18 IMPRESSION: Changes of prior Whipple procedure. Previously seen pancreatic duct stent presumably has migrated across the choledocho-jejunostomy and is now located in the biliary ducts. No biliary or pancreatic ductal dilatation.   Severe diffuse low-density throughout the liver, likely fatty infiltration.   Wall thickening within the ascending colon and hepatic flexure concerning for colitis.   Small amount of free fluid in the pelvis.   Aortic atherosclerosis.   07/25/2018 PET scan   PET 07/25/18  IMPRESSION: 1. Right hepatic lobe hypermetabolism, in the setting of heterogeneous hepatic steatosis. Finding is highly suspicious for isolated hepatic metastasis. 2. More equivocal lower level hypermetabolism along the medial hepatic  capsule and within the hepatic dome. These areas could be postoperative and  physiologic respectively. Recommend attention on follow-up. 3. Coronary artery atherosclerosis. Aortic Atherosclerosis (ICD10-I70.0).      CURRENT THERAPY:  Pending liver biopsy  INTERVAL HISTORY:  Jane Brooks is here for a follow up. She presents to the clinic alone. She notes she was to see her GI Dr. Ardis Hughs on Monday for GI issues but Rad Onc scheduled her for Monday as well for her biopsy. She notes the first time she went for biopsy she did not have anyone to watch her so she did not go through with procedure.  Since last ED visit she has been eating better. She still has episode of tearing pain and nausea but it is improving. She is taking MS Contin BID and Morphine 15-31m 1-2 times a day as needed. She still uses flexeril as needed. She is interested in weaning off of it.  She notes left foot swelling. She denies injury to this before.    REVIEW OF SYSTEMS:   Constitutional: Denies fevers, chills or abnormal weight loss Eyes: Denies blurriness of vision Ears, nose, mouth, throat, and face: Denies mucositis or sore throat Respiratory: Denies cough, dyspnea or wheezes Cardiovascular: Denies palpitation, chest discomfort (+) ;eft lower extremity swelling Gastrointestinal:  Denies nausea, heartburn or change in bowel habits (+) Intermittent abdominal pain Skin: Denies abnormal skin rashes Lymphatics: Denies new lymphadenopathy or easy bruising Neurological:Denies numbness, tingling or new weaknesses Behavioral/Psych: Mood is stable, no new changes  All other systems were reviewed with the patient and are negative.  MEDICAL HISTORY:  Past Medical History:  Diagnosis Date  . Family history of lung cancer   . Family history of prostate cancer   . Family history of uterine cancer   . Gallstones 10/2017  . [place]reatic cancer ([place]   . Pneumothorax, closed, traumatic    years ago  . PONV (postoperative nausea and vomiting)     SURGICAL HISTORY: Past Surgical History:   Procedure Laterality Date  . BILIARY STENT PLACEMENT  10/31/2017   Procedure: BILIARY STENT PLACEMENT;  Surgeon: GJackquline Denmark MD;  [facility];  Service: Gastroenterology;;  . ERCP N/A 10/31/2017   Procedure: ENDOSCOPIC RETROGRADE CHOLANGIOPANCREATOGRAPHY (ERCP);  Surgeon: GJackquline Denmark MD;  [facility];  Service: Gastroenterology;  Laterality: N/A;  . ESOPHAGOGASTRODUODENOSCOPY N/A 11/08/2017   Procedure: ESOPHAGOGASTRODUODENOSCOPY (EGD);  Surgeon: JMilus Banister MD;  [facility];  Service: Endoscopy;  Laterality: N/A;  . EUS N/A 11/08/2017   Procedure: UPPER ENDOSCOPIC ULTRASOUND (EUS) RADIAL;  Surgeon: JMilus Banister MD;  [facility]: [facility];  Service: Endoscopy;  Laterality: N/A;  . FINE NEEDLE ASPIRATION  11/08/2017   Procedure: FINE NEEDLE ASPIRATION;  Surgeon: JMilus Banister MD;  Location: [facility] ENDOSCOPY;  Service: Endoscopy;;  . IR IMAGING GUIDED PORT INSERTION  11/15/2017  . LAPAROSCOPY N/A 04/25/2018   Procedure: LAPAROSCOPY DIAGNOSTIC;  Surgeon: BStark Klein MD;  [place] [facility]  Service: General;  Laterality: N/A;  GENERAL AND EPIDURAL ANESTHESIA  . SPHINCTEROTOMY  10/31/2017   Procedure: SPHINCTEROTOMY;  Surgeon: GJackquline Denmark MD;  [facility];  Service: Gastroenterology;;  . TONSILLECTOMY    . WHIPPLE PROCEDURE N/A 04/25/2018   Procedure: WHIPPLE PROCEDURE, RECONSTRUCTION OF PORTAL VEIN;  Surgeon: BStark Klein MD;  [facility]  Service: General;  Laterality: N/A;    I have reviewed the social history and family history with the patient and they are unchanged from previous note.  ALLERGIES:  is allergic  to latex.  MEDICATIONS:  Current Outpatient Medications  Medication Sig Dispense Refill  . acetaminophen (TYLENOL) 325 MG tablet Take 1-2 tablets (325-650 mg total) by mouth every 4 (four) hours as needed for mild pain.    . cyclobenzaprine (FLEXERIL) 5 MG tablet Take 1 tablet (5 mg total) by mouth 3 (three)  times daily as needed for muscle spasms. 60 tablet 1  . lipase/protease/amylase (CREON) 36000 UNITS CPEP capsule Take 36,000 Units by mouth 3 (three) times daily before meals.    . metoCLOPramide (REGLAN) 5 MG tablet Take 1 tablet (5 mg total) by mouth every 6 (six) hours as needed for nausea or vomiting. 60 tablet 1  . morphine (MS CONTIN) 15 MG 12 hr tablet Take 1 tablet (15 mg total) by mouth every 12 (twelve) hours. 60 tablet 0  . morphine (MSIR) 15 MG tablet Take 1 tablet (15 mg total) by mouth every 6 (six) hours as needed for severe pain. 90 tablet 0  . ondansetron (ZOFRAN) 8 MG tablet Take 1 tablet (8 mg total) by mouth every 8 (eight) hours as needed for nausea or vomiting. 30 tablet 0  . pantoprazole (PROTONIX) 40 MG tablet Take 1 tablet (40 mg total) by mouth daily. 60 tablet 3  . potassium chloride SA (K-DUR) 20 MEQ tablet Take 1 tablet (20 mEq total) by mouth daily. 30 tablet 1  . promethazine (PHENERGAN) 25 MG tablet Take 1 tablet (25 mg total) by mouth every 6 (six) hours as needed for nausea or vomiting. 30 tablet 0   No current facility-administered medications for this visit.     PHYSICAL EXAMINATION: ECOG PERFORMANCE STATUS: 2 - Symptomatic, <50% confined to bed  Vitals:   08/08/18 1122  BP: 110/82  Pulse: 88  Resp: 18  Temp: 97.9 F (36.6 C)  TempSrc: Oral  SpO2: 100%  Weight: 101 lb 4.8 oz (45.9 kg)  Height: _0  (1.473 m)    GENERAL:alert, no distress and comfortable SKIN: skin color, texture, turgor are normal, no rashes or significant lesions EYES: normal, Conjunctiva are pink and non-injected, sclera clear  NECK: supple, thyroid normal size, non-tender, without nodularity LYMPH:  no palpable lymphadenopathy in the cervical, axillary  LUNGS: clear to auscultation and percussion with normal breathing effort HEART: regular rate & rhythm and no murmurs (+) left lower extremity edema  ABDOMEN:abdomen soft, non-tender and normal bowel sounds  Musculoskeletal:no cyanosis of digits and no clubbing  NEURO: alert & oriented x 3 with fluent speech, no focal motor/sensory deficits    LABORATORY DATA:  I have reviewed the data as listed CBC Latest Ref Rng & Units 08/08/2018 08/02/2018 07/08/2018  WBC 4.0 - 10.5 K/uL 7.8 6.3 8.4  Hemoglobin 12.0 - 15.0 g/dL 9.6(L) 11.7(L) 9.9(L)  Hematocrit 36.0 - 46.0 % 29.0(L) 36.6 29.9(L)  Platelets 150 - 400 K/uL 215 254 200     CMP Latest Ref Rng & Units 08/08/2018 08/02/2018 07/23/2018  Glucose 70 - 99 mg/dL 85 131(H) 72  BUN 6 - 20 mg/dL 9 5(L) 5(L)  Creatinine 0.44 - 1.00 mg/dL 0.54 0.58 0.58  Sodium 135 - 145 mmol/L 137 139 136  Potassium 3.5 - 5.1 mmol/L 4.0 3.6 4.2  Chloride 98 - 111 mmol/L 102 105 104  CO2 22 - 32 mmol/L _1 Calcium 8.9 - 10.3 mg/dL 8.6(L) 8.8(L) 7.7(L)  Total Protein 6.5 - 8.1 g/dL 6.6 6.6 5.9(L)  Total Bilirubin 0.3 - 1.2 mg/dL 0.9 0.7 0.8  Alkaline Phos 38 - 126  U/L 171(H) 124 131(H)  AST 15 - 41 U/L 96(H) 62(H) 72(H)  ALT 0 - 44 U/L 75(H) 45(H) 39      RADIOGRAPHIC STUDIES: I have personally reviewed the radiological images as listed and agreed with the findings in the report. No results found.   ASSESSMENT & PLAN:  Jane Brooks is a 48 y.o. female with   1. 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  -Unfortunately her recovery has been difficult with abdominal pain, nausea, vomiting and diarrhea, multiple ED visits and hospitalization -CT abdomen pelvis from 07/06/18 was negative for cancer recurrence, it showed colitis. However suspicion of metastatic disease due to her persistent GI symptoms -She continues to improve with supportive IV Fluids. GI symptoms are slowly resolving.  -Her PET from 07/25/18 which showed right hepatic lobe  hypermetabolic activity, which is suspicious for isolated liver metastasis. There is also mild uptake of LN along medial hepatic capsule and within the hepatic dome, no other evidence of mets.  -liver biopsy was scheduled for last week but she could not do it due to scheduling issues, it's rescheduled for next week -Labs reviewed, CBC and CMP WNL except hg 9.6, Ca 8.6, Albumin 3, AST 96, ALT 75, Alk Phos 171. CA 19.9 still pending. She is clinically stable, declined IVF today  -f/u after biopsy next week    2. Abdominal pain, recent Colitis with N&V -She was admitted to ED recently and treated with antibiotics.  -Continue Antiemetics  -Her nausea is improving, with occasional emesis. Her abdominal pain is improving. She is responded to IV Fluids well. Continue as needed.  -She notes flexeril helps her more than Cymbalta.  -Her pain is controlled with MS Contin BID and Morphine 15-36m 1-2 times a day, but not everyday. She still uses flexeril as needed. I refilled Morphine today (08/08/18) -We both discussed and agreed that when her pain improves we will start to wean her off narcotics.   3. Social and FAcupuncturist-I previously referred to SPaoniamoved into her new apartment. -She now has her puppy and doing well with him.  4. 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 No need blood transfusion for now   5. Genetics -She was seen by the gDietitianGenetic testhasbeen held due to lack of insurance.  6. Smoking Cessation  -She is currently down to smoking 3/4 ppd -I strongly encouraged her to continue to reduce and completely quit as going into surgery this can effect her recovery. She understands.  -She notes she has further cut back on smoking and continues to work on quitting completely. I encouraged her to continue.   Plan -I refilled her Morphine IR today  -IR Liver  biopsy on 7/27 -Lab and f/u next week    No problem-specific Assessment & Plan notes found for this encounter.   No orders of the defined types were placed in this encounter.  All questions were answered. The patient knows to call the clinic with any problems, questions or concerns. No barriers to learning was detected. I spent 15 minutes counseling the patient face to face. The total time spent in the appointment was 20 minutes and more than 50% was on counseling and review of test results    YKrista Blue  Burr Medico, MD 08/08/2018   I, Joslyn Devon, am acting as scribe for Truitt Merle, MD.   I have reviewed the above documentation for accuracy and completeness, and I agree with the above.

## 2018-08-02 NOTE — ED Provider Notes (Signed)
[facility] EMERGENCY DEPARTMENT Provider Note   CSN: [id number] Arrival date & time: 08/02/18  0354    History   Chief Complaint Chief Complaint  Patient presents with   Abdominal Pain    HPI Jane Brooks is a 48 y.o. female.  48 y.o female with a PMH of pancreatic adenocarcinoma diagnosed on 10/2017 presents to the ED with a chief complaint of lower abdominal pain and vomiting x 2 days. Patient reports a cramping sensation which began two days ago, reports some episodes of vomiting then.  However, the cramping sensation to her lower abdomen woke up from her sleep this morning around 2 AM.  She reports taking her 15 mg morphine tablet along with some Zofran around 4 AM without improvement in symptoms.  She reports some bilious vomiting, has been unable to keep anything down.  She reports she usually has these nausea and vomiting episodes due to carcinoma, states these are increasing in frequency.  She also notes diarrhea, no blood in her stool but has taken some antidiarrheal medication prior to arrival.  Patient also states she is scheduled for a IR biopsy today, was unsure who will be performing this procedure.  She denies any fever, back pain, urinary symptoms, chest pain or shortness of breath. Of note patient continues to smoke half a pack to a pack a day, according to patient depending on her day and stressors.  The history is provided by the patient.  Abdominal Pain Associated symptoms: diarrhea, nausea and vomiting   Associated symptoms: no chest pain, no chills, no cough, no dysuria, no fever, no hematuria, no shortness of breath and no sore throat     Past Medical History:  Diagnosis Date   Family history of lung cancer    Family history of prostate cancer    Family history of uterine cancer    Gallstones 10/2017   Pancreatic cancer ([place])    Pneumothorax, closed, traumatic    years ago   PONV (postoperative nausea and vomiting)      Patient Active Problem List   Diagnosis Date Noted   Hospital discharge follow-up 06/29/2018   Nausea & vomiting 06/29/2018   Colitis 06/15/2018   Hypokalemia 05/22/2018   Peripheral edema    Prediabetes    Acute blood loss anemia    Thrombocytopenia (HCC)    Urinary retention    Sinus tachycardia    Debility 05/16/2018   Acute respiratory failure with hypoxemia ([place]) 04/30/2018   HCAP (healthcare-associated pneumonia) 04/30/2018   Essential hypertension 04/30/2018   Chronic prescription opiate use 04/30/2018   Postoperative pain 04/30/2018   Tachycardia 04/30/2018   Tachypnea 04/30/2018   Acute respiratory alkalosis 04/30/2018   Adenocarcinoma of head of pancreas ([place]) 04/25/2018   Genetic testing 01/24/2018   Family history of uterine cancer    Family history of prostate cancer    Family history of lung cancer    Pancreatic cancer ([place]) 11/09/2017   Pancreatic mass    Cholecystitis 10/30/2017   Jaundice 10/30/2017    Past Surgical History:  Procedure Laterality Date   BILIARY STENT PLACEMENT  10/31/2017   Procedure: BILIARY STENT PLACEMENT;  Surgeon: [name], MD;  Location: [place];  Service: Gastroenterology;;   ERCP N/A 10/31/2017   Procedure: ENDOSCOPIC RETROGRADE CHOLANGIOPANCREATOGRAPHY (ERCP);  Surgeon: [name], MD;  Location: [facility] ENDOSCOPY;  Service: Gastroenterology;  Laterality: N/A;   ESOPHAGOGASTRODUODENOSCOPY N/A 11/08/2017   Procedure: ESOPHAGOGASTRODUODENOSCOPY (EGD);  Surgeon: Milus Banister, MD;  [facility]: [facility] ENDOSCOPY;  Service: Endoscopy;  Laterality: N/A;   EUS N/A 11/08/2017   Procedure: UPPER ENDOSCOPIC ULTRASOUND (EUS) RADIAL;  Surgeon: Milus Banister, MD;  Location[facility] ENDOSCOPY;  Service: Endoscopy;  Laterality: N/A;   FINE NEEDLE ASPIRATION  11/08/2017   Procedure: FINE NEEDLE ASPIRATION;  Surgeon: Milus Banister, MD;  Location: [facility] ENDOSCOPY;  Service: Endoscopy;;   IR IMAGING GUIDED  PORT INSERTION  11/15/2017   LAPAROSCOPY N/A 04/25/2018   Procedure: LAPAROSCOPY DIAGNOSTIC;  Surgeon: Stark Klein, MD;  Location: [place];  Service: General;  Laterality: N/A;  GENERAL AND EPIDURAL ANESTHESIA   SPHINCTEROTOMY  10/31/2017   Procedure: SPHINCTEROTOMY;  Surgeon: [name], MD;  Location: [place];  Service: Gastroenterology;;   TONSILLECTOMY     WHIPPLE PROCEDURE N/A 04/25/2018   Procedure: WHIPPLE PROCEDURE, RECONSTRUCTION OF PORTAL VEIN;  Surgeon: Stark Klein, MD;  Location: [place];  Service: General;  Laterality: N/A;     OB History   No obstetric history on file.      Home Medications    Prior to Admission medications   Medication Sig Start Date End Date Taking? Authorizing Provider  acetaminophen (TYLENOL) 325 MG tablet Take 1-2 tablets (325-650 mg total) by mouth every 4 (four) hours as needed for mild pain. 05/21/18   Love, Ivan Anchors, PA-C  cyclobenzaprine (FLEXERIL) 5 MG tablet Take 1 tablet (5 mg total) by mouth 3 (three) times daily as needed for muscle spasms. 07/08/18   Truitt Merle, MD  lipase/protease/amylase (CREON) 36000 UNITS CPEP capsule Take 36,000 Units by mouth 3 (three) times daily before meals.    [provider]  metoCLOPramide (REGLAN) 5 MG tablet Take 1 tablet (5 mg total) by mouth every 6 (six) hours as needed for nausea or vomiting. 07/23/18   Alla Feeling, NP  morphine (MS CONTIN) 15 MG 12 hr tablet Take 1 tablet (15 mg total) by mouth every 12 (twelve) hours. 07/08/18   Truitt Merle, MD  morphine (MSIR) 15 MG tablet Take 1 tablet (15 mg total) by mouth every 4 (four) hours as needed for severe pain. 07/23/18   Alla Feeling, NP  ondansetron (ZOFRAN) 8 MG tablet Take 1 tablet (8 mg total) by mouth every 8 (eight) hours as needed for nausea or vomiting. 07/06/18   Deno Etienne, DO  pantoprazole (PROTONIX) 40 MG tablet Take 1 tablet (40 mg total) by mouth daily. 06/19/18   Stark Klein, MD  potassium chloride SA (K-DUR) 20 MEQ tablet Take 1  tablet (20 mEq total) by mouth daily. 07/23/18   Alla Feeling, NP  promethazine (PHENERGAN) 25 MG tablet Take 1 tablet (25 mg total) by mouth every 6 (six) hours as needed for nausea or vomiting. 06/19/18   Stark Klein, MD    Family History Family History  Problem Relation Age of Onset   Diabetes Mother    Hypertension Mother    COPD Mother    Uterine cancer Mother 42       had hysterectomy   Diabetes Sister    Hypertension Sister    Lung cancer Maternal Grandmother        lung cancer   Hypertension Sister     Social History Social History   Tobacco Use   Smoking status: Former Smoker    Packs/day: 1.00    Years: 37.00    Pack years: 37.00    Types: Cigarettes    Quit date: 04/25/2018    Years since quitting: 0.2   Smokeless tobacco: Never Used  Substance Use Topics   Alcohol use: Not Currently   Drug use: Never     Allergies   Latex   Review of Systems Review of Systems  Constitutional: Negative for chills and fever.  HENT: Negative for ear pain and sore throat.   Eyes: Negative for pain and visual disturbance.  Respiratory: Negative for cough and shortness of breath.   Cardiovascular: Negative for chest pain and palpitations.  Gastrointestinal: Positive for abdominal pain, diarrhea, nausea and vomiting. Negative for blood in stool.  Genitourinary: Negative for dysuria and hematuria.  Musculoskeletal: Negative for arthralgias and back pain.  Skin: Negative for color change and rash.  Neurological: Negative for seizures and syncope.  All other systems reviewed and are negative.    Physical Exam Updated Vital Signs BP 116/73    Pulse 82    Temp 98.3 F (36.8 C) (Oral)    Resp 17    SpO2 95%   Physical Exam Vitals signs and nursing note reviewed.  Constitutional:      General: She is not in acute distress.    Appearance: She is well-developed.     Comments: Appears cachectic.  HENT:     Head: Normocephalic and atraumatic.      Mouth/Throat:     Pharynx: No oropharyngeal exudate.  Eyes:     Pupils: Pupils are equal, round, and reactive to light.     Comments: No scleral icterus present on my exam.  Neck:     Musculoskeletal: Normal range of motion.  Cardiovascular:     Rate and Rhythm: Regular rhythm.     Heart sounds: Normal heart sounds.  Pulmonary:     Effort: Pulmonary effort is normal. No respiratory distress.     Comments: Lungs are diminished.  No wheezing, rales, rhonchi. Abdominal:     General: Abdomen is flat. A surgical scar is present. Bowel sounds are decreased. There is no distension.     Palpations: Abdomen is soft.     Tenderness: There is abdominal tenderness in the right lower quadrant, suprapubic area and left lower quadrant. There is no right CVA tenderness or left CVA tenderness.     Hernia: No hernia is present.     Comments: Abdomen appears flat and rigid, tenderness to palpation around lower area.  Musculoskeletal:        General: No tenderness or deformity.     Right lower leg: No edema.     Left lower leg: No edema.  Skin:    General: Skin is warm and dry.  Neurological:     Mental Status: She is alert and oriented to person, place, and time.      ED Treatments / Results  Labs (all labs ordered are listed, but only abnormal results are displayed) Labs Reviewed  CBC WITH DIFFERENTIAL/PLATELET - Abnormal; Notable for the following components:      Result Value   RBC 5.56 (*)    Hemoglobin 11.7 (*)    MCV 65.8 (*)    MCH 21.0 (*)    RDW 19.6 (*)    All other components within normal limits  COMPREHENSIVE METABOLIC PANEL - Abnormal; Notable for the following components:   Glucose, Bld 131 (*)    BUN 5 (*)    Calcium 8.8 (*)    Albumin 3.3 (*)    AST 62 (*)    ALT 45 (*)    All other components within normal limits  URINALYSIS, ROUTINE W REFLEX MICROSCOPIC - Abnormal; Notable for the  following components:   Color, Urine AMBER (*)    APPearance HAZY (*)    Ketones,  ur 5 (*)    All other components within normal limits  LIPASE, BLOOD  PROTIME-INR    EKG None  Radiology No results found.  Procedures Procedures (including critical care time)  Medications Ordered in ED Medications  HYDROmorphone (DILAUDID) injection 1 mg (1 mg Intravenous Given 08/02/18 0805)  sodium chloride 0.9 % bolus 1,000 mL (0 mLs Intravenous Stopped 08/02/18 1031)  ondansetron (ZOFRAN) injection 4 mg (4 mg Intravenous Given 08/02/18 0810)  HYDROmorphone (DILAUDID) injection 2 mg (2 mg Intravenous Given 08/02/18 0926)  ondansetron (ZOFRAN) injection 4 mg (4 mg Intravenous Given 08/02/18 0926)     Initial Impression / Assessment and Plan / ED Course  I have reviewed the triage vital signs and the nursing notes.  Pertinent labs & imaging results that were available during my care of the patient were reviewed by me and considered in my medical decision making (see chart for details).    Patient with a prior medical history of pancreatic adenocarcinoma diagnosed in October 2019.  Had a resection along with a Whipple procedure performed, no chemotherapy or radiation.  She is currently followed by Dr. Burr Medico, I have personally reviewed patient's medical records which showed last visit via telehealth on 7/10 notes of her visit listed below:  "PET from 07/25/18 which showed right hepatic lobe hypermetabolic activity, which is suspicious for isolated liver metastasis. There is also mild uptake of LN along medial hepatic capsule and within the hepatic dome, no other evidence of mets"  She endorses having nausea, vomiting, abdominal pain at baseline, the episodes of vomiting have increased in nature, she is unable to keep her pain under control while at home with 50 mg of morphine which she is currently taking every 4 hours for pain as needed, she also has a second dose of 50 mg of morphine every 12 hours which she has also been taken. Will obtain laboratory screening, pain control and fluids  along with consultation to Dr. Burr Medico for her recommendations, suspect patient will likely need admission for further pain management.  CBC showed no leukocytosis, hemoglobin is actually improved from her last visit 11.7.  CMP showed no electrolyte dysfunction, LFTs are elevated today but only slightly from her previous visit.  Lipase level is within normal limits.  UA showed no nitrates, leukocytes.  Patient was given 1 mg of Dilaudid along with some Zofran, had an episode of vomiting here.  Was given Zofran again with 2 mg of Dilaudid along with fluids.  Upon reassessment patient reports she feels better, does have an appointment scheduled today add 11 AM for a liver biopsy.  Is requesting to have a biopsy today, will call interventional radiology in order to coordinate this.  Spoke to [place] PA at IR who was informed on the case, she will run the case with her attending physician, did ask for a PT and INR will add this to the lab.  11:02 AM spoke to [place] PA at interventional radiology who reports attending Dr. Vernard Gambles back will move forward with procedure.  Patient is to go to short stay at this time, she will need her IV in place, will need to check in as her procedure is not scheduled until 1 PM but does need to arrive to short stay at 11 AM. Plan discussed with patient, she is agreeable and going.  No further episodes of vomiting, vitals within normal limits.  Patient was also seen by my attending who agrees with management at this time.   Portions of this note were generated with Lobbyist. Dictation errors may occur despite best attempts at proofreading.    Final Clinical Impressions(s) / ED Diagnoses   Final diagnoses:  Intractable vomiting with nausea, unspecified vomiting type    ED Discharge Orders    None       Janeece Fitting, PA-C 08/02/18 [place], MD 08/06/18 [phone]

## 2018-08-02 NOTE — Telephone Encounter (Signed)
Spoke with patient to let her know that refills were sent into [facility] pharmacy, she states she had to reschedule her biopsy today because she was not told she would have to have a driver and someone to stay with her overnight.

## 2018-08-06 ENCOUNTER — Telehealth: Payer: Self-pay | Admitting: *Deleted

## 2018-08-06 NOTE — Telephone Encounter (Signed)
Myrtle,  Could you call IR and get her biopsy scheduled ASAP? Let them know her home situation, or let IR MD call me if they insist she needs to stay with someone after biopsy.   Thanks,  Krista Blue

## 2018-08-06 NOTE — Telephone Encounter (Signed)
Talked with pt.  She was seen in ED 7/17 due to pain but was also having some nausea/vomiting. She reports no nausea at this time & is eating small amts frequently & keeping things down.  She has not heard from anyone about the biopsy.  She is concerned that she was not told that she needed someone with her overnight after bx.  She thinks she can arrange a driver but doesn't know anyone to stay with her.  She has a neighbor that can check on her but she has kids & couldn't stay overnight.  She states that someone told her that she might need to be admitted overnight in this case.  Informed that someone should be calling her about r/s & to discuss this with them to see how we need to handle this.  She expressed understanding. Message already sent to [facility] to r/s ASAP per Valda Favia RN.

## 2018-08-06 NOTE — Telephone Encounter (Signed)
Thanks Myrtle. Malachy Mood, please follow up tomorrow if not scheduled today.  Krista Blue

## 2018-08-06 NOTE — Telephone Encounter (Signed)
Left message with Toni/IR Scheduling to schedule ASAP.

## 2018-08-07 ENCOUNTER — Telehealth: Payer: Self-pay

## 2018-08-07 NOTE — Telephone Encounter (Signed)
TC to patient per Lacie. Patient was scheduled for US biopsy on Tuesday 08/13/18 @1pm  at [facility] hospital 1st floor radiology. That was the earliest appointment that they had available. She needs to arrive by 11:15am and be NPO @7am . If she needs to take any meds with food before 7am she may do so with a light breakfast. And if she needs to take any meds after 7am she may do so with only sips of water. Patient can have 1 visitor with her during appointment and will need a driver to take her home No public transportation. Patient is aware of appointment date, time and instructions.

## 2018-08-07 NOTE — Telephone Encounter (Signed)
Left vm for patient to call back [facility]

## 2018-08-08 ENCOUNTER — Inpatient Hospital Stay: Payer: Medicaid Other

## 2018-08-08 ENCOUNTER — Inpatient Hospital Stay (HOSPITAL_BASED_OUTPATIENT_CLINIC_OR_DEPARTMENT_OTHER): Payer: Medicaid Other | Admitting: Hematology

## 2018-08-08 ENCOUNTER — Other Ambulatory Visit: Payer: Self-pay

## 2018-08-08 ENCOUNTER — Telehealth: Payer: Self-pay | Admitting: Hematology

## 2018-08-08 ENCOUNTER — Encounter: Payer: Self-pay | Admitting: Hematology

## 2018-08-08 VITALS — BP 110/82 | HR 88 | Temp 97.9°F | Resp 18 | Ht <= 58 in | Wt 101.3 lb

## 2018-08-08 DIAGNOSIS — F17210 Nicotine dependence, cigarettes, uncomplicated: Secondary | ICD-10-CM

## 2018-08-08 DIAGNOSIS — Z8042 Family history of malignant neoplasm of prostate: Secondary | ICD-10-CM

## 2018-08-08 DIAGNOSIS — I700 Atherosclerosis of aorta: Secondary | ICD-10-CM

## 2018-08-08 DIAGNOSIS — Z801 Family history of malignant neoplasm of trachea, bronchus and lung: Secondary | ICD-10-CM

## 2018-08-08 DIAGNOSIS — C250 Malignant neoplasm of head of pancreas: Secondary | ICD-10-CM

## 2018-08-08 DIAGNOSIS — C257 Malignant neoplasm of other parts of pancreas: Secondary | ICD-10-CM

## 2018-08-08 DIAGNOSIS — D6481 Anemia due to antineoplastic chemotherapy: Secondary | ICD-10-CM | POA: Diagnosis not present

## 2018-08-08 DIAGNOSIS — R918 Other nonspecific abnormal finding of lung field: Secondary | ICD-10-CM

## 2018-08-08 DIAGNOSIS — I10 Essential (primary) hypertension: Secondary | ICD-10-CM

## 2018-08-08 DIAGNOSIS — I2510 Atherosclerotic heart disease of native coronary artery without angina pectoris: Secondary | ICD-10-CM

## 2018-08-08 DIAGNOSIS — R55 Syncope and collapse: Secondary | ICD-10-CM

## 2018-08-08 DIAGNOSIS — D563 Thalassemia minor: Secondary | ICD-10-CM

## 2018-08-08 DIAGNOSIS — Z79899 Other long term (current) drug therapy: Secondary | ICD-10-CM

## 2018-08-08 DIAGNOSIS — R600 Localized edema: Secondary | ICD-10-CM

## 2018-08-08 DIAGNOSIS — K529 Noninfective gastroenteritis and colitis, unspecified: Secondary | ICD-10-CM

## 2018-08-08 DIAGNOSIS — M7989 Other specified soft tissue disorders: Secondary | ICD-10-CM

## 2018-08-08 DIAGNOSIS — E876 Hypokalemia: Secondary | ICD-10-CM

## 2018-08-08 LAB — CMP (CANCER CENTER ONLY)
ALT: 75 U/L — ABNORMAL HIGH (ref 0–44)
AST: 96 U/L — ABNORMAL HIGH (ref 15–41)
Albumin: 3 g/dL — ABNORMAL LOW (ref 3.5–5.0)
Alkaline Phosphatase: 171 U/L — ABNORMAL HIGH (ref 38–126)
Anion gap: 8 (ref 5–15)
BUN: 9 mg/dL (ref 6–20)
CO2: 27 mmol/L (ref 22–32)
Calcium: 8.6 mg/dL — ABNORMAL LOW (ref 8.9–10.3)
Chloride: 102 mmol/L (ref 98–111)
Creatinine: 0.54 mg/dL (ref 0.44–1.00)
GFR, Est AFR Am: >60 mL/min (ref >60)
GFR, Estimated: >60 mL/min (ref >60)
Glucose, Bld: 85 mg/dL (ref 70–99)
Potassium: 4 mmol/L (ref 3.5–5.1)
Sodium: 137 mmol/L (ref 135–145)
Total Bilirubin: 0.9 mg/dL (ref 0.3–1.2)
Total Protein: 6.6 g/dL (ref 6.5–8.1)

## 2018-08-08 LAB — CBC WITH DIFFERENTIAL (CANCER CENTER ONLY)
Abs Immature Granulocytes: 0.03 10*3/uL (ref 0.00–0.07)
Basophils Absolute: 0 10*3/uL (ref 0.0–0.1)
Basophils Relative: 0 %
Eosinophils Absolute: 0.2 10*3/uL (ref 0.0–0.5)
Eosinophils Relative: 2 %
HCT: 29 % — ABNORMAL LOW (ref 36.0–46.0)
Hemoglobin: 9.6 g/dL — ABNORMAL LOW (ref 12.0–15.0)
Immature Granulocytes: 0 %
Lymphocytes Relative: 28 %
Lymphs Abs: 2.2 10*3/uL (ref 0.7–4.0)
MCH: 20.7 pg — ABNORMAL LOW (ref 26.0–34.0)
MCHC: 33.1 g/dL (ref 30.0–36.0)
MCV: 62.6 fL — ABNORMAL LOW (ref 80.0–100.0)
Monocytes Absolute: 0.6 10*3/uL (ref 0.1–1.0)
Monocytes Relative: 7 %
Neutro Abs: 4.8 10*3/uL (ref 1.7–7.7)
Neutrophils Relative %: 63 %
Platelet Count: 215 10*3/uL (ref 150–400)
RBC: 4.63 MIL/uL (ref 3.87–5.11)
RDW: 18.6 % — ABNORMAL HIGH (ref 11.5–15.5)
WBC Count: 7.8 10*3/uL (ref 4.0–10.5)
nRBC: 0 % (ref 0.0–0.2)

## 2018-08-08 MED ORDER — SODIUM CHLORIDE 0.9% FLUSH
10.0000 mL | INTRAVENOUS | Status: DC | PRN
  Administered 2018-08-08: 11:00:00 10 mL
  Filled 2018-08-08: qty 10

## 2018-08-08 MED ORDER — SODIUM CHLORIDE 0.9% FLUSH
3.0000 mL | Freq: Once | INTRAVENOUS | Status: DC | PRN
  Filled 2018-08-08: qty 10

## 2018-08-08 MED ORDER — HEPARIN SOD (PORK) LOCK FLUSH 100 UNIT/ML IV SOLN
500.0000 [IU] | Freq: Once | INTRAVENOUS | Status: AC | PRN
  Administered 2018-08-08: 500 [IU]
  Filled 2018-08-08: qty 5

## 2018-08-08 MED ORDER — MORPHINE SULFATE 15 MG PO TABS: 15.0000 mg | ORAL_TABLET | Freq: Four times a day (QID) | ORAL | 0 refills | Status: DC | PRN

## 2018-08-08 MED ORDER — HEPARIN SOD (PORK) LOCK FLUSH 100 UNIT/ML IV SOLN
250.0000 [IU] | Freq: Once | INTRAVENOUS | Status: DC | PRN
  Filled 2018-08-08: qty 5

## 2018-08-08 MED FILL — MORPHINE SULFATE IR 15 MG T: 15 | 23 days supply | Qty: 90 | Fill #0

## 2018-08-08 NOTE — Progress Notes (Signed)
Port de accessed per Pt she stated she is not staying for fluids

## 2018-08-08 NOTE — Telephone Encounter (Signed)
Scheduled appt per 7/23 los.  Left a voice message of appt date and time.

## 2018-08-09 LAB — CANCER ANTIGEN 19-9: CA 19-9: 22 U/mL (ref 0–35)

## 2018-08-12 ENCOUNTER — Telehealth: Payer: Self-pay | Admitting: General Surgery

## 2018-08-12 ENCOUNTER — Other Ambulatory Visit: Payer: Self-pay | Admitting: Radiology

## 2018-08-12 NOTE — Telephone Encounter (Signed)
errror

## 2018-08-12 NOTE — Telephone Encounter (Signed)
Left a voicemail for the patient to Covid screen for in person visit on 08/13/2018

## 2018-08-13 ENCOUNTER — Encounter (HOSPITAL_COMMUNITY): Payer: Self-pay

## 2018-08-13 ENCOUNTER — Telehealth: Payer: Self-pay | Admitting: Hematology

## 2018-08-13 ENCOUNTER — Other Ambulatory Visit: Payer: Self-pay

## 2018-08-13 ENCOUNTER — Ambulatory Visit (HOSPITAL_COMMUNITY)
Admission: RE | Admit: 2018-08-13 | Discharge: 2018-08-13 | Disposition: A | Discharge status: Home / Self Care | Payer: Medicaid Other | Source: Ambulatory Visit | Attending: Hematology | Admitting: Hematology

## 2018-08-13 ENCOUNTER — Ambulatory Visit: Payer: Medicaid Other | Admitting: Gastroenterology

## 2018-08-13 ENCOUNTER — Ambulatory Visit (HOSPITAL_COMMUNITY)
Admission: RE | Admit: 2018-08-13 | Discharge: 2018-08-13 | Disposition: A | Discharge status: Home / Self Care | Payer: Medicaid Other | Source: Ambulatory Visit | Attending: Radiology | Admitting: Radiology

## 2018-08-13 ENCOUNTER — Other Ambulatory Visit: Payer: Self-pay | Admitting: Hematology

## 2018-08-13 ENCOUNTER — Telehealth: Payer: Self-pay | Admitting: *Deleted

## 2018-08-13 DIAGNOSIS — Z801 Family history of malignant neoplasm of trachea, bronchus and lung: Secondary | ICD-10-CM | POA: Insufficient documentation

## 2018-08-13 DIAGNOSIS — Z8049 Family history of malignant neoplasm of other genital organs: Secondary | ICD-10-CM | POA: Insufficient documentation

## 2018-08-13 DIAGNOSIS — C257 Malignant neoplasm of other parts of pancreas: Secondary | ICD-10-CM | POA: Insufficient documentation

## 2018-08-13 DIAGNOSIS — Z91040 Latex allergy status: Secondary | ICD-10-CM | POA: Diagnosis not present

## 2018-08-13 DIAGNOSIS — R109 Unspecified abdominal pain: Secondary | ICD-10-CM | POA: Insufficient documentation

## 2018-08-13 DIAGNOSIS — R51 Headache: Secondary | ICD-10-CM | POA: Diagnosis not present

## 2018-08-13 DIAGNOSIS — Z8042 Family history of malignant neoplasm of prostate: Secondary | ICD-10-CM | POA: Insufficient documentation

## 2018-08-13 DIAGNOSIS — K769 Liver disease, unspecified: Secondary | ICD-10-CM | POA: Diagnosis not present

## 2018-08-13 DIAGNOSIS — K760 Fatty (change of) liver, not elsewhere classified: Secondary | ICD-10-CM | POA: Insufficient documentation

## 2018-08-13 DIAGNOSIS — Z79899 Other long term (current) drug therapy: Secondary | ICD-10-CM | POA: Diagnosis not present

## 2018-08-13 LAB — CBC WITH DIFFERENTIAL/PLATELET
Abs Immature Granulocytes: 0.06 10*3/uL (ref 0.00–0.07)
Basophils Absolute: 0 10*3/uL (ref 0.0–0.1)
Basophils Relative: 0 %
Eosinophils Absolute: 0.2 10*3/uL (ref 0.0–0.5)
Eosinophils Relative: 2 %
HCT: 28.9 % — ABNORMAL LOW (ref 36.0–46.0)
Hemoglobin: 9.2 g/dL — ABNORMAL LOW (ref 12.0–15.0)
Immature Granulocytes: 1 %
Lymphocytes Relative: 20 %
Lymphs Abs: 1.7 10*3/uL (ref 0.7–4.0)
MCH: 20.4 pg — ABNORMAL LOW (ref 26.0–34.0)
MCHC: 31.8 g/dL (ref 30.0–36.0)
MCV: 63.9 fL — ABNORMAL LOW (ref 80.0–100.0)
Monocytes Absolute: 0.6 10*3/uL (ref 0.1–1.0)
Monocytes Relative: 8 %
Neutro Abs: 5.7 10*3/uL (ref 1.7–7.7)
Neutrophils Relative %: 69 %
Platelets: 232 10*3/uL (ref 150–400)
RBC: 4.52 MIL/uL (ref 3.87–5.11)
RDW: 18.1 % — ABNORMAL HIGH (ref 11.5–15.5)
WBC: 8.3 10*3/uL (ref 4.0–10.5)
nRBC: 0 % (ref 0.0–0.2)

## 2018-08-13 LAB — COMPREHENSIVE METABOLIC PANEL
ALT: 69 U/L — ABNORMAL HIGH (ref 0–44)
AST: 60 U/L — ABNORMAL HIGH (ref 15–41)
Albumin: 2.9 g/dL — ABNORMAL LOW (ref 3.5–5.0)
Alkaline Phosphatase: 162 U/L — ABNORMAL HIGH (ref 38–126)
Anion gap: 10 (ref 5–15)
BUN: 7 mg/dL (ref 6–20)
CO2: 26 mmol/L (ref 22–32)
Calcium: 8.5 mg/dL — ABNORMAL LOW (ref 8.9–10.3)
Chloride: 97 mmol/L — ABNORMAL LOW (ref 98–111)
Creatinine, Ser: 0.37 mg/dL — ABNORMAL LOW (ref 0.44–1.00)
GFR calc Af Amer: >60 mL/min (ref >60)
GFR calc non Af Amer: >60 mL/min (ref >60)
Glucose, Bld: 94 mg/dL (ref 70–99)
Potassium: 3.6 mmol/L (ref 3.5–5.1)
Sodium: 133 mmol/L — ABNORMAL LOW (ref 135–145)
Total Bilirubin: 1 mg/dL (ref 0.3–1.2)
Total Protein: 6.6 g/dL (ref 6.5–8.1)

## 2018-08-13 LAB — PROTIME-INR
INR: 1 (ref 0.8–1.2)
Prothrombin Time: 13.3 seconds (ref 11.4–15.2)

## 2018-08-13 MED ORDER — HEPARIN SOD (PORK) LOCK FLUSH 100 UNIT/ML IV SOLN
500.0000 [IU] | INTRAVENOUS | Status: AC | PRN
  Administered 2018-08-13: 500 [IU]
  Filled 2018-08-13: qty 5

## 2018-08-13 MED ORDER — MIDAZOLAM HCL 2 MG/2ML IJ SOLN
INTRAMUSCULAR | Status: AC
  Filled 2018-08-13: qty 4

## 2018-08-13 MED ORDER — FENTANYL CITRATE (PF) 100 MCG/2ML IJ SOLN
INTRAMUSCULAR | Status: AC
  Filled 2018-08-13: qty 2

## 2018-08-13 MED ORDER — LIDOCAINE HCL 1 % IJ SOLN
INTRAMUSCULAR | Status: AC
  Filled 2018-08-13: qty 20

## 2018-08-13 MED ORDER — MIDAZOLAM HCL 2 MG/2ML IJ SOLN
INTRAMUSCULAR | Status: AC | PRN
  Administered 2018-08-13 (×3): 1 mg via INTRAVENOUS

## 2018-08-13 MED ORDER — SODIUM CHLORIDE 0.9 % IV SOLN
INTRAVENOUS | Status: DC
  Administered 2018-08-13: 12:00:00 via INTRAVENOUS

## 2018-08-13 MED ORDER — LIDOCAINE HCL (PF) 1 % IJ SOLN
INTRAMUSCULAR | Status: AC | PRN
  Administered 2018-08-13: 10 mL

## 2018-08-13 MED ORDER — FENTANYL CITRATE (PF) 100 MCG/2ML IJ SOLN
INTRAMUSCULAR | Status: AC | PRN
  Administered 2018-08-13 (×2): 50 ug via INTRAVENOUS

## 2018-08-13 MED ORDER — GELATIN ABSORBABLE 12-7 MM EX MISC
CUTANEOUS | Status: AC
  Filled 2018-08-13: qty 1

## 2018-08-13 NOTE — Telephone Encounter (Signed)
  OK, I will send a schedule message   Truitt Merle MD

## 2018-08-13 NOTE — Procedures (Signed)
panc ca, PET positive right liver lesion postop  S/p CT rt liver lesion bx  Unable to see with [place].  Able to do CT bx of right liver area based off PET finding as target. If path neg, would consider MRI to assess liver  Path pending Full report in pacs

## 2018-08-13 NOTE — H&P (Signed)
Referring Physician(s): Feng,Yan  Supervising Physician: Daryll Brod  Patient Status:  [facility]  Chief Complaint:  "I'm having a liver biopsy"  Subjective: Patient familiar to IR service from Port-A-Cath placement on 11/15/2017.  She has a history of pancreatic carcinoma in 2019, status post Whipple procedure, biliary stenting and chemotherapy.  Recent PET scan reveals right hepatic lobe hypermetabolism in setting of heterogeneous hepatic steatosis concerning for isolated hepatic metastasis.  There is more equivocal lower level hypermetabolism along the medial hepatic capsule and within the hepatic dome.  She presents today for image guided liver lesion biopsy for further evaluation.  She currently denies fever, chest pain, dyspnea, cough, back pain, vomiting or bleeding.  She does have occasional headaches, mild generalized abdominal discomfort and intermittent nausea.  Past Medical History:  Diagnosis Date  . Family history of lung cancer   . Family history of prostate cancer   . Family history of uterine cancer   . Gallstones 10/2017  . Pancreatic cancer ([place])   . Pneumothorax, closed, traumatic    years ago  . PONV (postoperative nausea and vomiting)    Past Surgical History:  Procedure Laterality Date  . BILIARY STENT PLACEMENT  10/31/2017   Procedure: BILIARY STENT PLACEMENT;  Surgeon: [name], MD;  Location: [facility] ENDOSCOPY;  Service: Gastroenterology;;  . ERCP N/A 10/31/2017   Procedure: ENDOSCOPIC RETROGRADE CHOLANGIOPANCREATOGRAPHY (ERCP);  Surgeon: [name], MD;  Location[facility] ENDOSCOPY;  Service: Gastroenterology;  Laterality: N/A;  . ESOPHAGOGASTRODUODENOSCOPY N/A 11/08/2017   Procedure: ESOPHAGOGASTRODUODENOSCOPY (EGD);  Surgeon: Milus Banister, MD;  Location: [facility] ENDOSCOPY;  Service: Endoscopy;  Laterality: N/A;  . EUS N/A 11/08/2017   Procedure: UPPER ENDOSCOPIC ULTRASOUND (EUS) RADIAL;  Surgeon: Milus Banister, MD;  [facility];  Service:  Endoscopy;  Laterality: N/A;  . FINE NEEDLE ASPIRATION  11/08/2017   Procedure: FINE NEEDLE ASPIRATION;  Surgeon: Milus Banister, MD;  [facility]OS[facility];  Service: Endoscopy;;  . IR IMAGING GUIDED PORT INSERTION  11/15/2017  . LAPAROSCOPY N/A 04/25/2018   Procedure: LAPAROSCOPY DIAGNOSTIC;  Surgeon: Stark Klein, MD;  Location: [place];  Service: General;  Laterality: N/A;  GENERAL AND EPIDURAL ANESTHESIA  . SPHINCTEROTOMY  10/31/2017   Procedure: SPHINCTEROTOMY;  Surgeon: [name], MD;  Location: [facility] ENDOSCOPY;  Service: Gastroenterology;;  . TONSILLECTOMY    . WHIPPLE PROCEDURE N/A 04/25/2018   Procedure: WHIPPLE PROCEDURE, RECONSTRUCTION OF PORTAL VEIN;  Surgeon: Stark Klein, MD;  Location: [place];  Service: General;  Laterality: N/A;      Allergies: Latex  Medications: Prior to Admission medications   Medication Sig Start Date End Date Taking? Authorizing Provider  acetaminophen (TYLENOL) 325 MG tablet Take 1-2 tablets (325-650 mg total) by mouth every 4 (four) hours as needed for mild pain. 05/21/18   Love, Ivan Anchors, PA-C  cyclobenzaprine (FLEXERIL) 5 MG tablet Take 1 tablet (5 mg total) by mouth 3 (three) times daily as needed for muscle spasms. 08/02/18   Tanner, Lyndon Code., PA-C  lipase/protease/amylase (CREON) 36000 UNITS CPEP capsule Take 36,000 Units by mouth 3 (three) times daily before meals.    [provider]  metoCLOPramide (REGLAN) 5 MG tablet Take 1 tablet (5 mg total) by mouth every 6 (six) hours as needed for nausea or vomiting. 07/23/18   Alla Feeling, NP  morphine (MS CONTIN) 15 MG 12 hr tablet Take 1 tablet (15 mg total) by mouth every 12 (twelve) hours. 08/02/18   Tanner, Lyndon Code., PA-C  morphine (MSIR)  15 MG tablet Take 1 tablet (15 mg total) by mouth every 6 (six) hours as needed for severe pain. 08/08/18   Truitt Merle, MD  ondansetron (ZOFRAN) 8 MG tablet Take 1 tablet (8 mg total) by mouth every 8 (eight) hours as needed for nausea or vomiting. 07/06/18    Deno Etienne, DO  pantoprazole (PROTONIX) 40 MG tablet Take 1 tablet (40 mg total) by mouth daily. 06/19/18   Stark Klein, MD  potassium chloride SA (K-DUR) 20 MEQ tablet Take 1 tablet (20 mEq total) by mouth daily. 07/23/18   Alla Feeling, NP  promethazine (PHENERGAN) 25 MG tablet Take 1 tablet (25 mg total) by mouth every 6 (six) hours as needed for nausea or vomiting. 06/19/18   Stark Klein, MD     Vital Signs: pend   Physical Exam awake, alert.  Chest clear to auscultation bilaterally.  Clean, intact right chest wall Port-A-Cath.  Heart with regular rate and rhythm.  Abdomen soft, positive bowel sounds, some mild generalized tenderness (primarily epigastric region) to palpation; no significant lower extremity edema  Imaging: No results found.  Labs:  CBC: Recent Labs    07/07/18 1456 07/08/18 0832 08/02/18 0726 08/08/18 1019  WBC 7.5 8.4 6.3 7.8  HGB 11.1* 9.9* 11.7* 9.6*  HCT 34.0* 29.9* 36.6 29.0*  PLT 207 200 254 215    COAGS: Recent Labs    04/25/18 1354 04/25/18 1911  05/10/18 0430 05/11/18 0542 08/02/18 0726 08/02/18 1228  INR 1.3* 1.4*   < > 1.3* 1.4* 1.0 1.1  APTT 32 38*  --   --   --   --   --    < > = values in this interval not displayed.    BMP: Recent Labs    07/08/18 0832 07/23/18 1550 08/02/18 0726 08/08/18 1019  NA 137 136 139 137  K 3.0* 4.2 3.6 4.0  CL 103 104 105 102  CO2 24 24 24 27   GLUCOSE 101* 72 131* 85  BUN <4* 5* 5* 9  CALCIUM 7.6* 7.7* 8.8* 8.6*  CREATININE 0.60 0.58 0.58 0.54  GFRNONAA >60 >60 >60 >60  GFRAA >60 >60 >60 >60    LIVER FUNCTION TESTS: Recent Labs    07/08/18 0832 07/23/18 1550 08/02/18 0726 08/08/18 1019  BILITOT 0.7 0.8 0.7 0.9  AST 51* 72* 62* 96*  ALT 29 39 45* 75*  ALKPHOS 93 131* 124 171*  PROT 5.9* 5.9* 6.6 6.6  ALBUMIN 2.7* 2.7* 3.3* 3.0*    Assessment and Plan: Pt with history of pancreatic carcinoma in 2019, status post Whipple procedure, biliary stenting and chemotherapy.  Recent PET  scan reveals right hepatic lobe hypermetabolism in setting of heterogeneous hepatic steatosis concerning for isolated hepatic metastasis.  There is more equivocal lower level hypermetabolism along the medial hepatic capsule and within the hepatic dome.  She presents today for image guided liver lesion biopsy for further evaluation.Risks and benefits of procedure was discussed with the patient  including, but not limited to bleeding, infection, damage to adjacent structures or low yield requiring additional tests.  All of the questions were answered and there is agreement to proceed.  Consent signed and in chart.  LABS PENDING   Electronically Signed: D. Rowe Robert, PA-C 08/13/2018, 11:47 AM   I spent a total of 25 minutes at the the patient's bedside AND on the patient's hospital floor or unit, greater than 50% of which was counseling/coordinating care for image guided liver lesion biopsy

## 2018-08-13 NOTE — Discharge Instructions (Signed)
Moderate Conscious Sedation, Adult, Care After These instructions provide you with information about caring for yourself after your procedure. Your health care provider may also give you more specific instructions. Your treatment has been planned according to current medical practices, but problems sometimes occur. Call your health care provider if you have any problems or questions after your procedure. What can I expect after the procedure? After your procedure, it is common:  To feel sleepy for several hours.  To feel clumsy and have poor balance for several hours.  To have poor judgment for several hours.  To vomit if you eat too soon. Follow these instructions at home: For at least 24 hours after the procedure:   Do not: ? Participate in activities where you could fall or become injured. ? Drive. ? Use heavy machinery. ? Drink alcohol. ? Take sleeping pills or medicines that cause drowsiness. ? Make important decisions or sign legal documents. ? Take care of children on your own.  Rest. Eating and drinking  Follow the diet recommended by your health care provider.  If you vomit: ? Drink water, juice, or soup when you can drink without vomiting. ? Make sure you have little or no nausea before eating solid foods. General instructions  Have a responsible adult stay with you until you are awake and alert.  Take over-the-counter and prescription medicines only as told by your health care provider.  If you smoke, do not smoke without supervision.  Keep all follow-up visits as told by your health care provider. This is important. Contact a health care provider if:  You keep feeling nauseous or you keep vomiting.  You feel light-headed.  You develop a rash.  You have a fever. Get help right away if:  You have trouble breathing. This information is not intended to replace advice given to you by your health care provider. Make sure you discuss any questions you have  with your health care provider. Document Released: 10/23/2012 Document Revised: 12/15/2016 Document Reviewed: 04/24/2015 Elsevier Patient Education  2020 [employer].   Liver Biopsy, Care After These instructions give you information on caring for yourself after your procedure. Your doctor may also give you more specific instructions. Call your doctor if you have any problems or questions after your procedure. What can I expect after the procedure? After the procedure, it is common to have:  Pain and soreness where the biopsy was done.  Bruising around the area where the biopsy was done.  Sleepiness and be tired for a few days. Follow these instructions at home: Medicines  Take over-the-counter and prescription medicines only as told by your doctor.  If you were prescribed an antibiotic medicine, take it as told by your doctor. Do not stop taking the antibiotic even if you start to feel better.  Do not take medicines such as aspirin and ibuprofen. These medicines can thin your blood. Do not take these medicines unless your doctor tells you to take them.  If you are taking prescription pain medicine, take actions to prevent or treat constipation. Your doctor may recommend that you: ? Drink enough fluid to keep your pee (urine) clear or pale yellow. ? Take over-the-counter or prescription medicines. ? Eat foods that are high in fiber, such as fresh fruits and vegetables, whole grains, and beans. ? Limit foods that are high in fat and processed sugars, such as fried and sweet foods. Caring for your cut  Follow instructions from your doctor about how to take care of  your cuts from surgery (incisions). Make sure you: ? Wash your hands with soap and water before you change your bandage (dressing). If you cannot use soap and water, use hand sanitizer. ? Change your bandage as told by your doctor.  You may remove your dressing tomorrow. ? Leave stitches (sutures), skin glue, or skin tape  (adhesive) strips in place. They may need to stay in place for 2 weeks or longer. If tape strips get loose and curl up, you may trim the loose edges. Do not remove tape strips completely unless your doctor says it is okay.  Check your cuts every day for signs of infection. Check for: ? Redness, swelling, or more pain. ? Fluid or blood. ? Pus or a bad smell. ? Warmth.  Do not take baths, swim, or use a hot tub until your doctor says it is okay to do so.  You may shower tomorrow. Activity   Rest at home for 1-2 days or as told by your doctor. ? Avoid sitting for a long time without moving. Get up to take short walks every 1-2 hours.  Return to your normal activities as told by your doctor. Ask what activities are safe for you.  Do not do these things in the first 24 hours: ? Drive. ? Use machinery. ? Take a bath or shower.  Do not lift more than 10 pounds (4.5 kg) or play contact sports for the first 2 weeks. General instructions   Do not drink alcohol in the first week after the procedure.  Have someone stay with you for at least 24 hours after the procedure.  Get your test results. Ask your doctor or the department that is doing the test: ? When will my results be ready? ? How will I get my results? ? What are my treatment options? ? What other tests do I need? ? What are my next steps?  Keep all follow-up visits as told by your doctor. This is important. Contact a doctor if:  A cut bleeds and leaves more than just a small spot of blood.  A cut is red, puffs up (swells), or hurts more than before.  Fluid or something else comes from a cut.  A cut smells bad.  You have a fever or chills. Get help right away if:  You have swelling, bloating, or pain in your belly (abdomen).  You get dizzy or faint.  You have a rash.  You feel sick to your stomach (nauseous) or throw up (vomit).  You have trouble breathing, feel short of breath, or feel faint.  Your chest  hurts.  You have problems talking or seeing.  You have trouble with your balance or moving your arms or legs. Summary  After the procedure, it is common to have pain, soreness, bruising, and tiredness.  Your doctor will tell you how to take care of yourself at home. Change your bandage, take your medicines, and limit your activities as told by your doctor.  Call your doctor if you have symptoms of infection. Get help right away if your belly swells, your cut bleeds a lot, or you have trouble talking or breathing. This information is not intended to replace advice given to you by your health care provider. Make sure you discuss any questions you have with your health care provider. Document Released: 10/12/2007 Document Revised: 01/12/2017 Document Reviewed: 01/12/2017 Elsevier Patient Education  2020 [employer].

## 2018-08-13 NOTE — Telephone Encounter (Signed)
R/s appt per 7/28 sch message - pt aware of new appt date and time

## 2018-08-13 NOTE — Telephone Encounter (Signed)
Pt called & states she has an appt this Thurs with Dr Burr Medico & needs to r/s to any other day preferably Fri or next week b/c she has a court date this Thursday. She is having her biopsy today.

## 2018-08-15 ENCOUNTER — Other Ambulatory Visit: Payer: Medicaid Other

## 2018-08-15 ENCOUNTER — Ambulatory Visit: Payer: Medicaid Other | Admitting: Nurse Practitioner

## 2018-08-15 ENCOUNTER — Ambulatory Visit: Payer: Medicaid Other

## 2018-08-15 NOTE — Progress Notes (Signed)
[place]   [phone] [phone]   Clinic Follow up Note   Patient Care Team: Patient, No Pcp Per as PCP - General (General Practice) Milus Banister, MD as Attending Physician (Gastroenterology) Truitt Merle, MD as Consulting Physician (Hematology)  Date of Service:  08/16/2018  CHIEF COMPLAINT: F/u on pancreatic cancer  SUMMARY OF ONCOLOGIC HISTORY: Oncology History Overview Note  Cancer Staging Pancreatic cancer [facility]) Staging form: Exocrine Pancreas, AJCC 8th Edition - Clinical stage from 11/08/2017: Stage IB (cT2, cN0, cM0) - Signed by Truitt Merle, MD on 11/09/2017     Pancreatic cancer ([place])  10/30/2017 Imaging   MRI Abdomen 10/30/17  IMPRESSION: 1. Marked intra and extrahepatic biliary duct dilatation with abrupt cut off of the common bile duct at the pancreatic head. This is associated with marked dilatation of the main pancreatic duct also with an abrupt cut off at the level of the pancreatic head. Pancreatic parenchyma in the body and tail of pancreas is markedly atrophic. A subtle 2.5 x 3 cm lesion with differential signal intensity is identified in the head of the pancreas which appears slightly hypoenhancing on postcontrast imaging. Together, these features are highly concerning for pancreatic adenocarcinoma. EUS is recommended to further evaluate. 2. Portal vein is patent although there is some mass-effect on the portal splenic confluence and IVC. Fat planes around celiac axis and SMA appear preserved. 3. Upper normal to borderline portal caval lymph node associated with small right para-aortic lymphadenopathy.     10/30/2017 Tumor Marker   Baseline Ca 19-9 at 140   10/31/2017 Procedure   ERCP by Dr. Lyndel Safe 10/31/17  IMPRESSION Malignant appearing CBD stricture (due to pancreatic mass) status post sphincterotomy and stent insertion.   11/08/2017 Cancer Staging   Staging form: Exocrine Pancreas, AJCC 8th Edition -  Clinical stage from 11/08/2017: Stage IB (cT2, cN0, cM0) - Signed by Truitt Merle, MD on 11/09/2017   11/08/2017 Procedure   EUS and EGD by Dr. Ardis Hughs 11/08/17  IMPRESSION -Very vague, irregularly bordered, approximately 2.6cm mass was found in the head of pancreas. This causes biliary and pancreatic duct obstruction. The previously plastic biliary stent is in good position. The mass directly abuts and attenuates the PV/SMV confluence, suggesting invasion. FNA performed and the preliminary cytology review is positive for maligancy (adenocarcinoma). - [facility] will arrange referrals to medical and surgical oncology. - Case to be discussed at upcoming multidisciplinary GI tumor conference.   11/08/2017 Initial Biopsy   Biopsy Cytology 11/08/17 Diagnosis FINE NEEDLE ASPIRATION, ENDOSCOPIC, PANCREAS HEAD (SPECIMEN 1 OF 1 COLLECTED 11/08/17): MALIGNANT CELLS CONSISTENT WITH ADENOCARCINOMA.   11/09/2017 Initial Diagnosis   Pancreatic cancer ([place])   11/15/2017 Imaging   CT Chest and Pelvis WO Contrast 11/15/17 IMPRESSION: 1. No findings of active malignancy in the chest or pelvis. 2. Aortic Atherosclerosis (ICD10-I70.0). Coronary atherosclerosis. 3. Faint centrilobular nodularity in the lung apices, raising the possibility of hypersensitivity pneumonitis or less likely respiratory bronchiolitis. Airway thickening is present, suggesting bronchitis or reactive airways disease.   11/19/2017 -  Chemotherapy   FOLFIRINOX q2weeks with Neulasta injection for 4 months starting 11/19/17. Completed 8 cycles on 03/07/18    02/04/2018 Imaging   CT AP W Contrast 02/04/18  IMPRESSION: 1. No substantial change in size of mass involving head of pancreas. A aortocaval lymph node which appears increased in the interval measuring 1.1 cm versus 0.8 cm previously. Stable 1.2 cm peripancreatic node. 2. Dilated proximal common bile duct is mildly increased in caliber  compared with 12/12/2017. The  common bile duct stent remains in place in appears to be in appropriate position.    04/19/2018 Imaging   CT AP 04/19/18  IMPRESSION: 1. Essentially stable exam compared to most recent CT scans of 02/04/2018 and 12/12/2017. 2. Stable mass in the head of the pancreas with duct dilatation and atrophy upstream. Mass continues to surround the GDA and contacts the portal vein but is free of the celiac trunk and SMA. Mass probably contacts the RIGHT hepatic artery at the takeoff of the GDA (image 56/5). Dominant LEFT hepatic artery originates from the LEFT gastric. 3. Mild intrahepatic and extrahepatic duct dilatation similar to prior with flexible stent in the common bile duct. 4. No new or progressive disease. 5. No peritoneal metastasis.   04/25/2018 Imaging   CT Angio chest 04/25/18  IMPRESSION: 1. No demonstrable pulmonary embolus. No thoracic aortic aneurysm or dissection. 2. Areas of atelectatic change bilaterally. No consolidation. No pneumothorax. Endotracheal tube tip is slightly superior to the carina. 3. Enlarged subcarinal lymph node, concerning for neoplastic etiology. 4. 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 Biliary stent present. Biliary duct air is felt to be secondary to the stent placement. 5. Mild upper abdominal ascites. Pneumoperitoneum with apparent drainage catheter present. The pneumoperitoneum is likely of post procedural/postoperative etiology. Bowel perforation in this circumstance cannot be excluded entirely, however. Close clinical assessment in this regard advised. 6.  Left ventricular hypertrophy.   04/25/2018 Surgery   LAPAROSCOPY DIAGNOSTIC and WHIPPLE PROCEDURE, RECONSTRUCTION OF PORTAL VEIN by Dr. Barry Dienes, Dr. Marcello Moores 04/25/18    04/25/2018 Pathology Results    Diagnosis 04/25/18 1. Whipple procedure/resection, with gallbladder with segment of portal vein - INVASIVE ADENOCARCINOMA, MODERATELY DIFFERENTIATED, SPANNING 3.5 CM. -  ADENOCARCINOMA IS PRESENT AT THE SUPERIOR MARGIN OF THE ATTACHED VEIN AND THE INFERIOR MARGIN OF THE ATTACHED VEIN (BLOCKS E AND F). - ADENOCARCINOMA EXTENDS INTO PERIPANCREATIC SOFT TISSUE. - PERINEURAL INVASION IS IDENTIFIED, DIFFUSE. - METASTATIC ADENOCARCINOMA IN 1 OF 24 LYMPH NODES (1/24). - SEE ONCOLOGY TABLE BELOW. 2. Lymph node, biopsy, portal - THERE IS NO EVIDENCE OF CARCINOMA IN 5 OF 5 LYMPH NODES (0/5). 3. Pancreas, biopsy, additional pancreatic margin - HIGH GRADE GLANDULAR DYSPLASIA, FOCAL    05/09/2018 Imaging   CT AP W Contrast 05/09/18 IMPRESSION: Status post Whipple's procedure. 1 surgical drain remains in the area. Pancreatic duct stent is noted.   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 It appears to be stable in size compared to prior exam.   Wall and fold thickening is seen involving right and transverse colon which may represent edema or possibly inflammation.   Aortic Atherosclerosis (ICD10-I70.0).   06/15/2018 Imaging   CT AP W Contrast 06/15/18 IMPRESSION: Changes of prior Whipple procedure. Previously seen pancreatic duct stent presumably has migrated across the choledocho-jejunostomy and is now located in the biliary ducts. No biliary or pancreatic ductal dilatation.   Severe diffuse low-density throughout the liver, likely fatty infiltration.   Wall thickening within the ascending colon and hepatic flexure concerning for colitis.   Small amount of free fluid in the pelvis.   Aortic atherosclerosis.   07/25/2018 PET scan   PET 07/25/18  IMPRESSION: 1. Right hepatic lobe hypermetabolism, in the setting of heterogeneous hepatic steatosis. Finding is highly suspicious for isolated hepatic metastasis. 2. More equivocal lower level hypermetabolism along the medial hepatic capsule and  within the hepatic dome. These areas could be postoperative and  physiologic respectively. Recommend attention on follow-up. 3. Coronary artery atherosclerosis. Aortic Atherosclerosis (ICD10-I70.0).   08/13/2018 Pathology Results   Diagnosis 08/13/18 Liver, needle/core biopsy, right - LIVER PARENCHYMA WITH A BILE EXTRAVASATION AND HISTIOCYTES - SEE COMMENT      CURRENT THERAPY:  Observation   INTERVAL HISTORY:  Jane Brooks is here for a follow up. She presents to the clinic alone. She notes her liver biopsy was very biopsy and continues to have moderate soreness of Right abdomen, she denies bleeding. She doubled her pain meds since then.  She notes her abdominal pain before biopsy was improving. She notes she does have better appetite and working to drink more water.      REVIEW OF SYSTEMS:  Constitutional: Denies fevers, chills or abnormal weight loss Eyes: Denies blurriness of vision Ears, nose, mouth, throat, and face: Denies mucositis or sore throat Respiratory: Denies cough, dyspnea or wheezes Cardiovascular: Denies palpitation, chest discomfort or lower extremity swelling Gastrointestinal:  Denies nausea, heartburn or change in bowel habits Skin: Denies abnormal skin rashes Lymphatics: Denies new lymphadenopathy or easy bruising Neurological:Denies numbness, tingling or new weaknesses Behavioral/Psych: Mood is stable, no new changes  All other systems were reviewed with the patient and are negative.  MEDICAL HISTORY:  Past Medical History:  Diagnosis Date  . Family history of lung cancer   . Family history of prostate cancer   . Family history of uterine cancer   . Gallstones 10/2017  . Pancreatic cancer ([place])   . Pneumothorax, closed, traumatic    years ago  . PONV (postoperative nausea and vomiting)     SURGICAL HISTORY: Past Surgical History:  Procedure Laterality Date  . BILIARY STENT PLACEMENT  10/31/2017   Procedure: BILIARY STENT PLACEMENT;  Surgeon: [name], MD;  Location: [facility] ENDOSCOPY;  Service:  Gastroenterology;;  . ERCP N/A 10/31/2017   Procedure: ENDOSCOPIC RETROGRADE CHOLANGIOPANCREATOGRAPHY (ERCP);  Surgeon: [name], MD;  Location: [facility] ENDOSCOPY;  Service: Gastroenterology;  Laterality: N/A;  . ESOPHAGOGASTRODUODENOSCOPY N/A 11/08/2017   Procedure: ESOPHAGOGASTRODUODENOSCOPY (EGD);  Surgeon: Milus Banister, MD;  Location: [facility] ENDOSCOPY;  Service: Endoscopy;  Laterality: N/A;  . EUS N/A 11/08/2017   Procedure: UPPER ENDOSCOPIC ULTRASOUND (EUS) RADIAL;  Surgeon: Milus Banister, MD;  Location: [facility] ENDOSCOPY;  Service: Endoscopy;  Laterality: N/A;  . FINE NEEDLE ASPIRATION  11/08/2017   Procedure: FINE NEEDLE ASPIRATION;  Surgeon: Milus Banister, MD;  Location: [facility] ENDOSCOPY;  Service: Endoscopy;;  . IR IMAGING GUIDED PORT INSERTION  11/15/2017  . LAPAROSCOPY N/A 04/25/2018   Procedure: LAPAROSCOPY DIAGNOSTIC;  Surgeon: Stark Klein, MD;  Location: [place];  Service: General;  Laterality: N/A;  GENERAL AND EPIDURAL ANESTHESIA  . SPHINCTEROTOMY  10/31/2017   Procedure: SPHINCTEROTOMY;  Surgeon: [name], MD;  Location: [facility] ENDOSCOPY;  Service: Gastroenterology;;  . TONSILLECTOMY    . WHIPPLE PROCEDURE N/A 04/25/2018   Procedure: WHIPPLE PROCEDURE, RECONSTRUCTION OF PORTAL VEIN;  Surgeon: Stark Klein, MD;  Location: [place];  Service: General;  Laterality: N/A;    I have reviewed the social history and family history with the patient and they are unchanged from previous note.  ALLERGIES:  is allergic to latex.  MEDICATIONS:  Current Outpatient Medications  Medication Sig Dispense Refill  . acetaminophen (TYLENOL) 325 MG tablet Take 1-2 tablets (325-650 mg total) by mouth every 4 (four) hours as needed for mild pain.    . cyclobenzaprine (FLEXERIL) 5 MG tablet Take 1  tablet (5 mg total) by mouth 3 (three) times daily as needed for muscle spasms. 60 tablet 1  . lipase/protease/amylase (CREON) 36000 UNITS CPEP capsule Take 36,000 Units by mouth 3 (three) times daily before  meals.    . metoCLOPramide (REGLAN) 5 MG tablet Take 1 tablet (5 mg total) by mouth every 6 (six) hours as needed for nausea or vomiting. 60 tablet 1  . morphine (MS CONTIN) 15 MG 12 hr tablet Take 1 tablet (15 mg total) by mouth every 12 (twelve) hours. 60 tablet 0  . morphine (MSIR) 15 MG tablet Take 1 tablet (15 mg total) by mouth every 6 (six) hours as needed for severe pain. 90 tablet 0  . ondansetron (ZOFRAN) 8 MG tablet Take 1 tablet (8 mg total) by mouth every 8 (eight) hours as needed for nausea or vomiting. 30 tablet 0  . pantoprazole (PROTONIX) 40 MG tablet Take 1 tablet (40 mg total) by mouth daily. 60 tablet 3  . potassium chloride SA (K-DUR) 20 MEQ tablet Take 1 tablet (20 mEq total) by mouth daily. 30 tablet 1  . promethazine (PHENERGAN) 25 MG tablet Take 1 tablet (25 mg total) by mouth every 6 (six) hours as needed for nausea or vomiting. 30 tablet 0  . gabapentin (NEURONTIN) 300 MG capsule Take 1 capsule (300 mg total) by mouth 3 (three) times daily. 90 capsule 1   No current facility-administered medications for this visit.     PHYSICAL EXAMINATION: ECOG PERFORMANCE STATUS: 1 - Symptomatic but completely ambulatory  Vitals:   08/16/18 1247  BP: 124/84  Pulse: (!) 107  Resp: 18  Temp: 98 F (36.7 C)  SpO2: 100%   Filed Weights   08/16/18 1247  Weight: 95 lb 6.4 oz (43.3 kg)    GENERAL:alert, no distress and comfortable SKIN: skin color, texture, turgor are normal, no rashes or significant lesions EYES: normal, Conjunctiva are pink and non-injected, sclera clear  NECK: supple, thyroid normal size, non-tender, without nodularity LYMPH:  no palpable lymphadenopathy in the cervical, axillary  LUNGS: clear to auscultation and percussion with normal breathing effort HEART: regular rate & rhythm and no murmurs and no lower extremity edema ABDOMEN:abdomen soft, non-tender and normal bowel sounds Musculoskeletal:no cyanosis of digits and no clubbing  NEURO: alert &  oriented x 3 with fluent speech, no focal motor/sensory deficits  LABORATORY DATA:  I have reviewed the data as listed CBC Latest Ref Rng & Units 08/16/2018 08/13/2018 08/08/2018  WBC 4.0 - 10.5 K/uL 7.7 8.3 7.8  Hemoglobin 12.0 - 15.0 g/dL 9.2(L) 9.2(L) 9.6(L)  Hematocrit 36.0 - 46.0 % 28.0(L) 28.9(L) 29.0(L)  Platelets 150 - 400 K/uL 280 232 215     CMP Latest Ref Rng & Units 08/16/2018 08/13/2018 08/08/2018  Glucose 70 - 99 mg/dL 108(H) 94 85  BUN 6 - 20 mg/dL _0 Creatinine 0.44 - 1.00 mg/dL 0.57 0.37(L) 0.54  Sodium 135 - 145 mmol/L 136 133(L) 137  Potassium 3.5 - 5.1 mmol/L 3.9 3.6 4.0  Chloride 98 - 111 mmol/L 99 97(L) 102  CO2 22 - 32 mmol/L _1 Calcium 8.9 - 10.3 mg/dL 8.8(L) 8.5(L) 8.6(L)  Total Protein 6.5 - 8.1 g/dL 6.8 6.6 6.6  Total Bilirubin 0.3 - 1.2 mg/dL 0.6 1.0 0.9  Alkaline Phos 38 - 126 U/L 201(H) 162(H) 171(H)  AST 15 - 41 U/L 56(H) 60(H) 96(H)  ALT 0 - 44 U/L 56(H) 69(H) 75(H)      RADIOGRAPHIC STUDIES: I  have personally reviewed the radiological images as listed and agreed with the findings in the report. No results found.   ASSESSMENT & PLAN:  Latora Quarry is a 47 y.o. female with   1. 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 liver mets now -Diagnosed in 10/2017.She is s/p neoadjuvant FOLFIRINOX for 4 months followed by whipple surgery by Dr Barry Dienes on 04/25/18. Unfortunately her surgical margins (vein) were positive and she had one positive node.  -Her postop recovery has been difficult with abdominal pain, nausea, vomiting and diarrhea, multiple ED visits and hospitalization -Her PET from 07/25/18 showedright hepatic lobe hypermetabolic activity, which is suspicious for isolated liver metastasis. There is also mild uptake of LN alongmedial hepatic capsule and within the hepatic dome, no other evidence of mets.  -We discussed her liver biopsy from 08/13/18 which shows no evidence  of cancer cells.  -I discussed there is a possibility the biopsy could have missed the suspicious mass of concern, but IR Dr. Annamaria Boots felt they got it and did not recommend a rebiopsy.  -I recommend following up with another CT scan in the next 1-2 months. -due to her positive surgical margins, I will refer her to rad/onc  -Outside of her pain her appetite and energy is picking up and nausea controlled. Labs reviewed, CBC and CMP WNL except Hg 9.2, BG 108, CA 8.8, Albumin 2.7, AST 56, ALT 56, Alk Phos 201. She declined IV Fluids again today, she will contact clinic if she needs it.   -F/u in 1 month. I will order next scan at that time.    2. Abdominal pain, with N&V -Her pain is controlled with MS Contin BID and Morphine 15-75m 1-2 times a day, but not everyday. She still uses flexeril as needed.  -We both discussed and agreed that when her pain improves we will start to wean her off narcotics.  -Her appetite and GI symptoms were slowly improving with improvement of her nausea. Since her liver biopsy her pain increased and has been taking 2 tabs morphine at a time to manage pain.  -I discussed decreasing her narcotics by starting her on Gabapentin. She is interested in trying. I will call in [id number] start nightly for 3-5 days and if tolerable she can increase to BID, then TID of needed.  -I discussed not taking flexeril, morphine or Gabapentin all together as this can cause heavy sedation. She understands.  -I strongly encouraged her to stay ahead of her symptoms with supportive medications. I also encouraged her to drink plenty of water daily and maintain eating regularly.   3. Social and FiAcupuncturistI previously referred to SWAvonmoreoved into her new apartment. -She now has her puppy and doing well with him.  4. 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 No need blood transfusion for now   5.  Genetics -She was seen by the geDietitianenetic testhasbeen held due to lack of insurance.  6. Smoking Cessation  -She is currently down to smoking 3/4 ppd -I strongly encouraged her to continue to reduce and completely quit as going into surgery this can effect her recovery. She understands.  -She notes she has further cut back on smoking and continues to work on quitting completely. I encouraged her to continue.   Plan -No IV Fluids today  -I refilled [id number] today  -Lab and f/u in 4 weeks  -  I gave another copy of her letter to the court I wrote in 02/2018. Her  -rad/onc referral   No problem-specific Assessment & Plan notes found for this encounter.   No orders of the defined types were placed in this encounter.  All questions were answered. The patient knows to call the clinic with any problems, questions or concerns. No barriers to learning was detected. I spent 20 minutes counseling the patient face to face. The total time spent in the appointment was 25 minutes and more than 50% was on counseling and review of test results     Truitt Merle, MD 08/16/2018   I, Joslyn Devon, am acting as scribe for Truitt Merle, MD.   I have reviewed the above documentation for accuracy and completeness, and I agree with the above.

## 2018-08-16 ENCOUNTER — Inpatient Hospital Stay: Payer: Medicaid Other

## 2018-08-16 ENCOUNTER — Other Ambulatory Visit: Payer: Self-pay

## 2018-08-16 ENCOUNTER — Encounter: Payer: Self-pay | Admitting: Hematology

## 2018-08-16 ENCOUNTER — Inpatient Hospital Stay (HOSPITAL_BASED_OUTPATIENT_CLINIC_OR_DEPARTMENT_OTHER): Payer: Medicaid Other | Admitting: Hematology

## 2018-08-16 VITALS — BP 124/84 | HR 107 | Temp 98.0°F | Resp 18 | Ht <= 58 in | Wt 95.4 lb

## 2018-08-16 DIAGNOSIS — C250 Malignant neoplasm of head of pancreas: Secondary | ICD-10-CM

## 2018-08-16 DIAGNOSIS — C257 Malignant neoplasm of other parts of pancreas: Secondary | ICD-10-CM

## 2018-08-16 DIAGNOSIS — I10 Essential (primary) hypertension: Secondary | ICD-10-CM

## 2018-08-16 LAB — CMP (CANCER CENTER ONLY)
ALT: 56 U/L — ABNORMAL HIGH (ref 0–44)
AST: 56 U/L — ABNORMAL HIGH (ref 15–41)
Albumin: 2.7 g/dL — ABNORMAL LOW (ref 3.5–5.0)
Alkaline Phosphatase: 201 U/L — ABNORMAL HIGH (ref 38–126)
Anion gap: 9 (ref 5–15)
BUN: 6 mg/dL (ref 6–20)
CO2: 28 mmol/L (ref 22–32)
Calcium: 8.8 mg/dL — ABNORMAL LOW (ref 8.9–10.3)
Chloride: 99 mmol/L (ref 98–111)
Creatinine: 0.57 mg/dL (ref 0.44–1.00)
GFR, Est AFR Am: >60 mL/min (ref >60)
GFR, Estimated: >60 mL/min (ref >60)
Glucose, Bld: 108 mg/dL — ABNORMAL HIGH (ref 70–99)
Potassium: 3.9 mmol/L (ref 3.5–5.1)
Sodium: 136 mmol/L (ref 135–145)
Total Bilirubin: 0.6 mg/dL (ref 0.3–1.2)
Total Protein: 6.8 g/dL (ref 6.5–8.1)

## 2018-08-16 LAB — CBC WITH DIFFERENTIAL (CANCER CENTER ONLY)
Abs Immature Granulocytes: 0.05 10*3/uL (ref 0.00–0.07)
Basophils Absolute: 0 10*3/uL (ref 0.0–0.1)
Basophils Relative: 1 %
Eosinophils Absolute: 0.2 10*3/uL (ref 0.0–0.5)
Eosinophils Relative: 2 %
HCT: 28 % — ABNORMAL LOW (ref 36.0–46.0)
Hemoglobin: 9.2 g/dL — ABNORMAL LOW (ref 12.0–15.0)
Immature Granulocytes: 1 %
Lymphocytes Relative: 29 %
Lymphs Abs: 2.2 10*3/uL (ref 0.7–4.0)
MCH: 20.4 pg — ABNORMAL LOW (ref 26.0–34.0)
MCHC: 32.9 g/dL (ref 30.0–36.0)
MCV: 62.1 fL — ABNORMAL LOW (ref 80.0–100.0)
Monocytes Absolute: 0.7 10*3/uL (ref 0.1–1.0)
Monocytes Relative: 9 %
Neutro Abs: 4.5 10*3/uL (ref 1.7–7.7)
Neutrophils Relative %: 58 %
Platelet Count: 280 10*3/uL (ref 150–400)
RBC: 4.51 MIL/uL (ref 3.87–5.11)
RDW: 17.6 % — ABNORMAL HIGH (ref 11.5–15.5)
WBC Count: 7.7 10*3/uL (ref 4.0–10.5)
nRBC: 0.3 % — ABNORMAL HIGH (ref 0.0–0.2)

## 2018-08-16 MED ORDER — HEPARIN SOD (PORK) LOCK FLUSH 100 UNIT/ML IV SOLN
500.0000 [IU] | Freq: Once | INTRAVENOUS | Status: AC | PRN
  Administered 2018-08-16: 13:00:00 500 [IU]
  Filled 2018-08-16: qty 5

## 2018-08-16 MED ORDER — GABAPENTIN 300 MG PO CAPS: 300.0000 mg | ORAL_CAPSULE | Freq: Three times a day (TID) | ORAL | 1 refills | Status: DC

## 2018-08-16 MED ORDER — SODIUM CHLORIDE 0.9% FLUSH
10.0000 mL | INTRAVENOUS | Status: DC | PRN
  Administered 2018-08-16: 10 mL
  Filled 2018-08-16: qty 10

## 2018-08-19 ENCOUNTER — Telehealth: Payer: Self-pay | Admitting: Hematology

## 2018-08-19 NOTE — Telephone Encounter (Signed)
Scheduled appt per 7/31 los. Spoke with patient and she is aware of appt date and time.

## 2018-08-20 ENCOUNTER — Telehealth: Payer: Self-pay | Admitting: Hematology

## 2018-08-20 ENCOUNTER — Other Ambulatory Visit: Payer: Self-pay | Admitting: Hematology

## 2018-08-20 DIAGNOSIS — C257 Malignant neoplasm of other parts of pancreas: Secondary | ICD-10-CM

## 2018-08-20 MED ORDER — MORPHINE SULFATE 15 MG PO TABS: 15.0000 mg | ORAL_TABLET | Freq: Four times a day (QID) | ORAL | 0 refills | Status: DC | PRN

## 2018-08-20 MED ORDER — PANTOPRAZOLE SODIUM 40 MG PO TBEC: 40.0000 mg | DELAYED_RELEASE_TABLET | Freq: Every day | ORAL | 3 refills | Status: DC

## 2018-08-20 MED FILL — PANTOPRAZOLE SOD DR 40 MG T: 40 | 60 days supply | Qty: 60 | Fill #0

## 2018-08-20 NOTE — Telephone Encounter (Signed)
I called pt and discussed benefit of adjuvant radiation due to her positive surgical margin. She agrees. Dr. Lisbeth Renshaw has reviewed the case and will offer treatment. Referral was made today.  I refilled her Morphine IR and protonix today. We discussed weaning off narcotics, she will reduce MS contin first (change to once daily then stop after a few weeks), then gradually reduce morphine IR. She agrees with the plan. She may not need MS contin refill or just a few weeks refill.   Truitt Merle  08/20/2018

## 2018-08-21 ENCOUNTER — Encounter (HOSPITAL_COMMUNITY): Payer: Self-pay

## 2018-08-21 ENCOUNTER — Other Ambulatory Visit: Payer: Self-pay

## 2018-08-21 ENCOUNTER — Emergency Department (HOSPITAL_COMMUNITY)
Admission: EM | Admit: 2018-08-21 | Discharge: 2018-08-21 | Disposition: A | Discharge status: Home / Self Care | Payer: Medicaid Other | Attending: Emergency Medicine | Admitting: Emergency Medicine

## 2018-08-21 ENCOUNTER — Emergency Department (HOSPITAL_COMMUNITY): Payer: Medicaid Other

## 2018-08-21 DIAGNOSIS — Z91040 Latex allergy status: Secondary | ICD-10-CM | POA: Insufficient documentation

## 2018-08-21 DIAGNOSIS — Z9221 Personal history of antineoplastic chemotherapy: Secondary | ICD-10-CM | POA: Insufficient documentation

## 2018-08-21 DIAGNOSIS — Z923 Personal history of irradiation: Secondary | ICD-10-CM | POA: Diagnosis not present

## 2018-08-21 DIAGNOSIS — C259 Malignant neoplasm of pancreas, unspecified: Secondary | ICD-10-CM | POA: Diagnosis not present

## 2018-08-21 DIAGNOSIS — R112 Nausea with vomiting, unspecified: Secondary | ICD-10-CM

## 2018-08-21 DIAGNOSIS — I10 Essential (primary) hypertension: Secondary | ICD-10-CM | POA: Insufficient documentation

## 2018-08-21 DIAGNOSIS — R1084 Generalized abdominal pain: Secondary | ICD-10-CM | POA: Diagnosis present

## 2018-08-21 DIAGNOSIS — Z79899 Other long term (current) drug therapy: Secondary | ICD-10-CM | POA: Insufficient documentation

## 2018-08-21 DIAGNOSIS — K529 Noninfective gastroenteritis and colitis, unspecified: Secondary | ICD-10-CM | POA: Diagnosis not present

## 2018-08-21 DIAGNOSIS — Z87891 Personal history of nicotine dependence: Secondary | ICD-10-CM | POA: Insufficient documentation

## 2018-08-21 DIAGNOSIS — Z8719 Personal history of other diseases of the digestive system: Secondary | ICD-10-CM | POA: Diagnosis not present

## 2018-08-21 LAB — COMPREHENSIVE METABOLIC PANEL
ALT: 39 U/L (ref 0–44)
AST: 51 U/L — ABNORMAL HIGH (ref 15–41)
Albumin: 3.4 g/dL — ABNORMAL LOW (ref 3.5–5.0)
Alkaline Phosphatase: 197 U/L — ABNORMAL HIGH (ref 38–126)
Anion gap: 18 — ABNORMAL HIGH (ref 5–15)
BUN: 5 mg/dL — ABNORMAL LOW (ref 6–20)
CO2: 18 mmol/L — ABNORMAL LOW (ref 22–32)
Calcium: 9.3 mg/dL (ref 8.9–10.3)
Chloride: 100 mmol/L (ref 98–111)
Creatinine, Ser: 0.59 mg/dL (ref 0.44–1.00)
GFR calc Af Amer: >60 mL/min (ref >60)
GFR calc non Af Amer: >60 mL/min (ref >60)
Glucose, Bld: 183 mg/dL — ABNORMAL HIGH (ref 70–99)
Potassium: 3.4 mmol/L — ABNORMAL LOW (ref 3.5–5.1)
Sodium: 136 mmol/L (ref 135–145)
Total Bilirubin: 0.6 mg/dL (ref 0.3–1.2)
Total Protein: 7.9 g/dL (ref 6.5–8.1)

## 2018-08-21 LAB — URINALYSIS, ROUTINE W REFLEX MICROSCOPIC
Bilirubin Urine: NEGATIVE
Glucose, UA: NEGATIVE mg/dL
Hgb urine dipstick: NEGATIVE
Ketones, ur: NEGATIVE mg/dL
Leukocytes,Ua: NEGATIVE
Nitrite: NEGATIVE
Protein, ur: NEGATIVE mg/dL
Specific Gravity, Urine: 1.009 (ref 1.005–1.030)
pH: 6 (ref 5.0–8.0)

## 2018-08-21 LAB — CBC
HCT: 38.1 % (ref 36.0–46.0)
Hemoglobin: 12 g/dL (ref 12.0–15.0)
MCH: 20.3 pg — ABNORMAL LOW (ref 26.0–34.0)
MCHC: 31.5 g/dL (ref 30.0–36.0)
MCV: 64.6 fL — ABNORMAL LOW (ref 80.0–100.0)
Platelets: 474 10*3/uL — ABNORMAL HIGH (ref 150–400)
RBC: 5.9 MIL/uL — ABNORMAL HIGH (ref 3.87–5.11)
RDW: 19.8 % — ABNORMAL HIGH (ref 11.5–15.5)
WBC: 10.4 10*3/uL (ref 4.0–10.5)
nRBC: 0.3 % — ABNORMAL HIGH (ref 0.0–0.2)

## 2018-08-21 LAB — LIPASE, BLOOD: Lipase: 16 U/L (ref 11–51)

## 2018-08-21 MED ORDER — PROCHLORPERAZINE EDISYLATE 10 MG/2ML IJ SOLN
10.0000 mg | Freq: Once | INTRAMUSCULAR | Status: AC
  Administered 2018-08-21: 10 mg via INTRAVENOUS
  Filled 2018-08-21: qty 2

## 2018-08-21 MED ORDER — HYDROMORPHONE HCL 1 MG/ML IJ SOLN
1.0000 mg | Freq: Once | INTRAMUSCULAR | Status: AC
  Administered 2018-08-21: 14:00:00 1 mg via INTRAVENOUS
  Filled 2018-08-21: qty 1

## 2018-08-21 MED ORDER — HYDROMORPHONE HCL 1 MG/ML IJ SOLN
1.0000 mg | Freq: Once | INTRAMUSCULAR | Status: AC
  Administered 2018-08-21: 1 mg via INTRAVENOUS
  Filled 2018-08-21: qty 1

## 2018-08-21 MED ORDER — SODIUM CHLORIDE 0.9 % IV BOLUS
1000.0000 mL | Freq: Once | INTRAVENOUS | Status: AC
  Administered 2018-08-21: 1000 mL via INTRAVENOUS

## 2018-08-21 MED ORDER — HEPARIN SOD (PORK) LOCK FLUSH 100 UNIT/ML IV SOLN
500.0000 [IU] | INTRAVENOUS | Status: AC | PRN
  Administered 2018-08-21: 500 [IU]

## 2018-08-21 MED ORDER — METOCLOPRAMIDE HCL 5 MG/ML IJ SOLN
10.0000 mg | Freq: Once | INTRAMUSCULAR | Status: AC
  Administered 2018-08-21: 10 mg via INTRAVENOUS
  Filled 2018-08-21: qty 2

## 2018-08-21 MED ORDER — AMOXICILLIN-POT CLAVULANATE 875-125 MG PO TABS: 1.0000 | ORAL_TABLET | Freq: Two times a day (BID) | ORAL | 0 refills | Status: AC

## 2018-08-21 MED ORDER — SODIUM CHLORIDE 0.9% FLUSH: 3.0000 mL | Freq: Once | INTRAVENOUS | Status: DC

## 2018-08-21 MED ORDER — IOHEXOL 300 MG/ML  SOLN
100.0000 mL | Freq: Once | INTRAMUSCULAR | Status: AC | PRN
  Administered 2018-08-21: 15:00:00 100 mL via INTRAVENOUS

## 2018-08-21 NOTE — ED Notes (Addendum)
Right chest port accessed with 1" 20g powerport kit. Biopatch in place. Blood return noted. No discomfort noted.

## 2018-08-21 NOTE — ED Provider Notes (Signed)
[facility] EMERGENCY DEPARTMENT Provider Note   CSN: [id number] Arrival date & time: 08/21/18  1043    History   Chief Complaint Chief Complaint  Patient presents with  . Abdominal Pain    HPI Jane Brooks is a 48 y.o. female.     The history is provided by the patient.  Abdominal Pain Pain location:  Generalized Pain quality: aching   Pain radiates to:  Does not radiate Pain severity:  Mild Onset quality:  Gradual Timing:  Constant Progression:  Waxing and waning Chronicity:  Recurrent Context: previous surgery (Hx of pancreatic cancer s/p chemo and surgey,)   Relieved by:  Nothing Worsened by:  Nothing Associated symptoms: diarrhea, nausea and vomiting   Associated symptoms: no chest pain, no chills, no cough, no dysuria, no fever, no hematuria, no shortness of breath and no sore throat   Risk factors: multiple surgeries     Past Medical History:  Diagnosis Date  . Family history of lung cancer   . Family history of prostate cancer   . Family history of uterine cancer   . Gallstones 10/2017  . Pancreatic cancer ([place])   . Pneumothorax, closed, traumatic    years ago  . PONV (postoperative nausea and vomiting)     Patient Active Problem List   Diagnosis Date Noted  . Hospital discharge follow-up 06/29/2018  . Nausea & vomiting 06/29/2018  . Colitis 06/15/2018  . Hypokalemia 05/22/2018  . Peripheral edema   . Prediabetes   . Acute blood loss anemia   . Thrombocytopenia ([place])   . Urinary retention   . Sinus tachycardia   . Debility 05/16/2018  . Acute respiratory failure with hypoxemia ([place]) 04/30/2018  . HCAP (healthcare-associated pneumonia) 04/30/2018  . Essential hypertension 04/30/2018  . Chronic prescription opiate use 04/30/2018  . Postoperative pain 04/30/2018  . Tachycardia 04/30/2018  . Tachypnea 04/30/2018  . Acute respiratory alkalosis 04/30/2018  . Adenocarcinoma of head of pancreas ([place]) 04/25/2018  . Genetic  testing 01/24/2018  . Family history of uterine cancer   . Family history of prostate cancer   . Family history of lung cancer   . Pancreatic cancer ([place]) 11/09/2017  . Pancreatic mass   . Cholecystitis 10/30/2017  . Jaundice 10/30/2017    Past Surgical History:  Procedure Laterality Date  . BILIARY STENT PLACEMENT  10/31/2017   Procedure: BILIARY STENT PLACEMENT;  Surgeon: [name], MD;  Location: [facility] ENDOSCOPY;  Service: Gastroenterology;;  . ERCP N/A 10/31/2017   Procedure: ENDOSCOPIC RETROGRADE CHOLANGIOPANCREATOGRAPHY (ERCP);  Surgeon: [name], MD;  Location: [facility] ENDOSCOPY;  Service: Gastroenterology;  Laterality: N/A;  . ESOPHAGOGASTRODUODENOSCOPY N/A 11/08/2017   Procedure: ESOPHAGOGASTRODUODENOSCOPY (EGD);  Surgeon: Milus Banister, MD;  Location: [facility] ENDOSCOPY;  Service: Endoscopy;  Laterality: N/A;  . EUS N/A 11/08/2017   Procedure: UPPER ENDOSCOPIC ULTRASOUND (EUS) RADIAL;  Surgeon: Milus Banister, MD;  Location: [facility] ENDOSCOPY;  Service: Endoscopy;  Laterality: N/A;  . FINE NEEDLE ASPIRATION  11/08/2017   Procedure: FINE NEEDLE ASPIRATION;  Surgeon: Milus Banister, MD;  Location: [facility] ENDOSCOPY;  Service: Endoscopy;;  . IR IMAGING GUIDED PORT INSERTION  11/15/2017  . LAPAROSCOPY N/A 04/25/2018   Procedure: LAPAROSCOPY DIAGNOSTIC;  Surgeon: Stark Klein, MD;  Location: [facility];  Service: General;  Laterality: N/A;  GENERAL AND EPIDURAL ANESTHESIA  . SPHINCTEROTOMY  10/31/2017   Procedure: SPHINCTEROTOMY;  Surgeon: [name], MD;  Location: [facility] ENDOSCOPY;  Service: Gastroenterology;;  . TONSILLECTOMY    . WHIPPLE PROCEDURE  N/A 04/25/2018   Procedure: WHIPPLE PROCEDURE, RECONSTRUCTION OF PORTAL VEIN;  Surgeon: Stark Klein, MD;  Location: [facility];  Service: General;  Laterality: N/A;     OB History   No obstetric history on file.      Home Medications    Prior to Admission medications   Medication Sig Start Date End Date Taking? Authorizing Provider   acetaminophen (TYLENOL) 325 MG tablet Take 1-2 tablets (325-650 mg total) by mouth every 4 (four) hours as needed for mild pain. 05/21/18   Love, Ivan Anchors, PA-C  amoxicillin-clavulanate (AUGMENTIN) 875-125 MG tablet Take 1 tablet by mouth every 12 (twelve) hours for 10 days. 08/21/18 08/31/18  Lael Pilch, DO  cyclobenzaprine (FLEXERIL) 5 MG tablet Take 1 tablet (5 mg total) by mouth 3 (three) times daily as needed for muscle spasms. 08/02/18   Tanner, Lyndon Code., PA-C  gabapentin (NEURONTIN) 300 MG capsule Take 1 capsule (300 mg total) by mouth 3 (three) times daily. 08/16/18   Truitt Merle, MD  lipase/protease/amylase (CREON) 36000 UNITS CPEP capsule Take 36,000 Units by mouth 3 (three) times daily before meals.    [provider]  metoCLOPramide (REGLAN) 5 MG tablet Take 1 tablet (5 mg total) by mouth every 6 (six) hours as needed for nausea or vomiting. 07/23/18   Alla Feeling, NP  morphine (MS CONTIN) 15 MG 12 hr tablet Take 1 tablet (15 mg total) by mouth every 12 (twelve) hours. 08/02/18   Tanner, Lyndon Code., PA-C  morphine (MSIR) 15 MG tablet Take 1 tablet (15 mg total) by mouth every 6 (six) hours as needed for severe pain. 08/20/18   Truitt Merle, MD  ondansetron (ZOFRAN) 8 MG tablet Take 1 tablet (8 mg total) by mouth every 8 (eight) hours as needed for nausea or vomiting. 07/06/18   Deno Etienne, DO  pantoprazole (PROTONIX) 40 MG tablet Take 1 tablet (40 mg total) by mouth daily. 08/20/18   Truitt Merle, MD  potassium chloride SA (K-DUR) 20 MEQ tablet Take 1 tablet (20 mEq total) by mouth daily. 07/23/18   Alla Feeling, NP  promethazine (PHENERGAN) 25 MG tablet Take 1 tablet (25 mg total) by mouth every 6 (six) hours as needed for nausea or vomiting. 06/19/18   Stark Klein, MD    Family History Family History  Problem Relation Age of Onset  . Diabetes Mother   . Hypertension Mother   . COPD Mother   . Uterine cancer Mother 76       had hysterectomy  . Diabetes Sister   . Hypertension Sister   .  Lung cancer Maternal Grandmother        lung cancer  . Hypertension Sister     Social History Social History   Tobacco Use  . Smoking status: Former Smoker    Packs/day: 1.00    Years: 37.00    Pack years: 37.00    Types: Cigarettes    Quit date: 04/25/2018    Years since quitting: 0.3  . Smokeless tobacco: Never Used  Substance Use Topics  . Alcohol use: Not Currently  . Drug use: Never     Allergies   Latex   Review of Systems Review of Systems  Constitutional: Negative for chills and fever.  HENT: Negative for ear pain and sore throat.   Eyes: Negative for pain and visual disturbance.  Respiratory: Negative for cough and shortness of breath.   Cardiovascular: Negative for chest pain and palpitations.  Gastrointestinal: Positive for abdominal pain,  diarrhea, nausea and vomiting. Negative for anal bleeding and blood in stool.  Genitourinary: Negative for dysuria and hematuria.  Musculoskeletal: Negative for arthralgias and back pain.  Skin: Negative for color change and rash.  Neurological: Negative for seizures and syncope.  All other systems reviewed and are negative.    Physical Exam Updated Vital Signs  ED Triage Vitals  Enc Vitals Group     BP 08/21/18 1046 122/86     Pulse Rate 08/21/18 1046 (!) 132     Resp 08/21/18 1046 18     Temp 08/21/18 1046 98.3 F (36.8 C)     Temp Source 08/21/18 1046 Oral     SpO2 08/21/18 1046 100 %     Weight --      Height --      Head Circumference --      Peak Flow --      Pain Score 08/21/18 1050 6     Pain Loc --      Pain Edu? --      Excl. in Pace? --     Physical Exam Vitals signs and nursing note reviewed.  Constitutional:      General: She is not in acute distress.    Appearance: She is well-developed.  HENT:     Head: Normocephalic and atraumatic.     Mouth/Throat:     Mouth: Mucous membranes are moist.     Pharynx: No oropharyngeal exudate.  Eyes:     Extraocular Movements: Extraocular movements  intact.     Conjunctiva/sclera: Conjunctivae normal.     Pupils: Pupils are equal, round, and reactive to light.  Neck:     Musculoskeletal: Neck supple.  Cardiovascular:     Rate and Rhythm: Normal rate and regular rhythm.     Heart sounds: Normal heart sounds. No murmur.  Pulmonary:     Effort: Pulmonary effort is normal. No respiratory distress.     Breath sounds: Normal breath sounds.  Abdominal:     General: There is no distension.     Palpations: Abdomen is soft.     Tenderness: There is abdominal tenderness. There is no right CVA tenderness, left CVA tenderness, guarding or rebound. Negative signs include Murphy's sign, Rovsing's sign, McBurney's sign and psoas sign.  Skin:    General: Skin is warm and dry.     Capillary Refill: Capillary refill takes less than 2 seconds.  Neurological:     General: No focal deficit present.     Mental Status: She is alert.  Psychiatric:        Mood and Affect: Mood normal.      ED Treatments / Results  Labs (all labs ordered are listed, but only abnormal results are displayed) Labs Reviewed  COMPREHENSIVE METABOLIC PANEL - Abnormal; Notable for the following components:      Result Value   Potassium 3.4 (*)    CO2 18 (*)    Glucose, Bld 183 (*)    BUN 5 (*)    Albumin 3.4 (*)    AST 51 (*)    Alkaline Phosphatase 197 (*)    Anion gap 18 (*)    All other components within normal limits  CBC - Abnormal; Notable for the following components:   RBC 5.90 (*)    MCV 64.6 (*)    MCH 20.3 (*)    RDW 19.8 (*)    Platelets 474 (*)    nRBC 0.3 (*)    All other components within  normal limits  LIPASE, BLOOD  URINALYSIS, ROUTINE W REFLEX MICROSCOPIC    EKG None  Radiology Ct Abdomen Pelvis W Contrast  Result Date: 08/21/2018 CLINICAL DATA:  Abdominal distension, generalized abdominal pain EXAM: CT ABDOMEN AND PELVIS WITH CONTRAST TECHNIQUE: Multidetector CT imaging of the abdomen and pelvis was performed using the standard  protocol following bolus administration of intravenous contrast. CONTRAST:  128m OMNIPAQUE IOHEXOL 300 MG/ML  SOLN COMPARISON:  07/06/2018 FINDINGS: Lower chest: No acute abnormality. Hepatobiliary: Diffuse hepatic steatosis. Heterogeneous area of non masslike enhancement within the right hepatic lobe is similar to prior CT. Mild pneumobilia, slightly less prominent compared to prior. No intrahepatic biliary ductal dilatation is appreciated. Pancreas: Status post Whipple procedure. No discrete recurrent mass is identified. Unremarkable appearance to the pancreatic tail. Spleen: Normal in size without focal abnormality. Adrenals/Urinary Tract: Unremarkable adrenal glands. Kidneys enhance symmetrically without focal lesion, calculus, or hydronephrosis. Urinary bladder is unremarkable for the degree of distention. Stomach/Bowel: Diffuse wall thickening and submucosal edema throughout the colon, similar in appearance to prior. For there is been interval improvement in the degree of thickening and submucosal edema within the stomach. Is or no dilated loops of bowel. No definite inflammatory changes. Vascular/Lymphatic: Scattered aortoiliac vascular calcifications. No lymphadenopathy identified in the abdomen or pelvis. Reproductive: Uterus and bilateral adnexa are unremarkable. Other: Small volume ascites, similar to prior. Musculoskeletal: No acute or significant osseous findings. IMPRESSION: 1. Diffuse colonic wall thickening and submucosal edema suggestive of colitis, findings are similar to prior study. 2. Interval improvement in the degree of gastric wall thickening and edema compared to prior CT. 3. Small volume ascites, unchanged. 4. Diffuse hepatic steatosis with grossly stable area of non masslike enhancement in the right hepatic lobe. Continued imaging surveillance of this finding is suggested. Electronically Signed   By: NDavina PokeM.D.   On: 08/21/2018 15:18    Procedures Procedures (including  critical care time)  Medications Ordered in ED Medications  sodium chloride flush (NS) 0.9 % injection 3 mL (has no administration in time range)  sodium chloride 0.9 % bolus 1,000 mL (0 mLs Intravenous Stopped 08/21/18 1533)  prochlorperazine (COMPAZINE) injection 10 mg (10 mg Intravenous Given 08/21/18 1226)  HYDROmorphone (DILAUDID) injection 1 mg (1 mg Intravenous Given 08/21/18 1226)  HYDROmorphone (DILAUDID) injection 1 mg (1 mg Intravenous Given 08/21/18 1344)  iohexol (OMNIPAQUE) 300 MG/ML solution 100 mL (100 mLs Intravenous Contrast Given 08/21/18 1445)  metoCLOPramide (REGLAN) injection 10 mg (10 mg Intravenous Given 08/21/18 1533)     Initial Impression / Assessment and Plan / ED Course  I have reviewed the triage vital signs and the nursing notes.  Pertinent labs & imaging results that were available during my care of the patient were reviewed by me and considered in my medical decision making (see chart for details).     KKailani Brassis a 48year old female with history of pancreatic cancer status post chemotherapy and Whipple procedure who presents the ED with nausea, vomiting, diarrhea, abdominal pain.  The symptoms have been on and off since her surgery.  Patient had recent biopsy of her liver that showed no new cancer process.  She is currently being referred to radiation oncology.  Not currently on chemotherapy.  Morphine and home Zofran have not helped with symptoms today.  She has generalized abdominal tenderness on exam.  No signs of peritonitis.  No fever.  We will get basic labs including CT scan abdomen and pelvis to rule out any new intra-abdominal process.  Make sure that there is not any postop or post biopsy problems.  Will give IV Compazine, IV fluids, IV Dilaudid reevaluate.  Urinalysis negative.  Overall no significant electrolyte abnormality, kidney injury.  No significant leukocytosis.  CT scan showed some continuous inflammation of the colon but improved inflammation  of the stomach.  Will conservatively treat with Augmentin in case that this is infectious.  However there is likely a chronic component to this.  Patient feels much better after IV pain medicine, IV fluids, IV nausea medications.  Has morphine and Zofran at home.  We will have her follow-up with her primary care doctor/oncologist.  Patient discharged from the ED in good condition.  Given return precautions.  This chart was dictated using voice recognition software.  Despite best efforts to proofread,  errors can occur which can change the documentation meaning.    Final Clinical Impressions(s) / ED Diagnoses   Final diagnoses:  Colitis  Nausea and vomiting, intractability of vomiting not specified, unspecified vomiting type    ED Discharge Orders         Ordered    amoxicillin-clavulanate (AUGMENTIN) 875-125 MG tablet  Every 12 hours     08/21/18 1537           Tarvaris Puglia, DO 08/21/18 1540

## 2018-08-21 NOTE — ED Notes (Signed)
Patient Alert and oriented to baseline. Stable and ambulatory to baseline. Patient verbalized understanding of the discharge instructions.  Patient belongings were taken by the patient. Port deaccessed and flushed with 500unit Heparin flush by this RN with IV team guidance.

## 2018-08-21 NOTE — Discharge Instructions (Addendum)
Follow-up with your primary care doctor/oncologist to the ED if your symptoms worsen.

## 2018-08-21 NOTE — ED Triage Notes (Signed)
Patient complains of abdominal pain with nausea and diarrhea past few days. States has this occasionally following surgery. Alert and oriented

## 2018-08-22 ENCOUNTER — Other Ambulatory Visit: Payer: Self-pay

## 2018-08-22 ENCOUNTER — Inpatient Hospital Stay: Payer: Medicaid Other | Attending: Hematology | Admitting: Medical

## 2018-08-22 ENCOUNTER — Telehealth: Payer: Self-pay

## 2018-08-22 ENCOUNTER — Telehealth: Payer: Self-pay | Admitting: Medical

## 2018-08-22 VITALS — BP 158/90 | HR 95 | Temp 97.8°F | Resp 17 | Ht <= 58 in | Wt 95.9 lb

## 2018-08-22 DIAGNOSIS — Z801 Family history of malignant neoplasm of trachea, bronchus and lung: Secondary | ICD-10-CM | POA: Diagnosis not present

## 2018-08-22 DIAGNOSIS — R140 Abdominal distension (gaseous): Secondary | ICD-10-CM | POA: Insufficient documentation

## 2018-08-22 DIAGNOSIS — R188 Other ascites: Secondary | ICD-10-CM | POA: Diagnosis not present

## 2018-08-22 DIAGNOSIS — C257 Malignant neoplasm of other parts of pancreas: Secondary | ICD-10-CM | POA: Insufficient documentation

## 2018-08-22 DIAGNOSIS — R197 Diarrhea, unspecified: Secondary | ICD-10-CM | POA: Diagnosis not present

## 2018-08-22 DIAGNOSIS — Z8042 Family history of malignant neoplasm of prostate: Secondary | ICD-10-CM | POA: Diagnosis not present

## 2018-08-22 DIAGNOSIS — R1030 Lower abdominal pain, unspecified: Secondary | ICD-10-CM | POA: Insufficient documentation

## 2018-08-22 DIAGNOSIS — C250 Malignant neoplasm of head of pancreas: Secondary | ICD-10-CM | POA: Diagnosis not present

## 2018-08-22 DIAGNOSIS — R112 Nausea with vomiting, unspecified: Secondary | ICD-10-CM | POA: Diagnosis not present

## 2018-08-22 DIAGNOSIS — Z87891 Personal history of nicotine dependence: Secondary | ICD-10-CM | POA: Diagnosis not present

## 2018-08-22 DIAGNOSIS — R6883 Chills (without fever): Secondary | ICD-10-CM | POA: Diagnosis not present

## 2018-08-22 DIAGNOSIS — K760 Fatty (change of) liver, not elsewhere classified: Secondary | ICD-10-CM | POA: Insufficient documentation

## 2018-08-22 DIAGNOSIS — Z79899 Other long term (current) drug therapy: Secondary | ICD-10-CM | POA: Insufficient documentation

## 2018-08-22 MED ORDER — DEXAMETHASONE SODIUM PHOSPHATE 10 MG/ML IJ SOLN
INTRAMUSCULAR | Status: AC
  Filled 2018-08-22: qty 1

## 2018-08-22 MED ORDER — ONDANSETRON HCL 4 MG/2ML IJ SOLN
8.0000 mg | Freq: Once | INTRAMUSCULAR | Status: AC
  Administered 2018-08-22: 10:00:00 8 mg via INTRAVENOUS

## 2018-08-22 MED ORDER — MORPHINE SULFATE 15 MG PO TABS: 15.0000 mg | ORAL_TABLET | ORAL | 0 refills | Status: DC | PRN

## 2018-08-22 MED ORDER — MORPHINE SULFATE 4 MG/ML IJ SOLN
2.0000 mg | Freq: Once | INTRAMUSCULAR | Status: AC
  Administered 2018-08-22: 10:00:00 2 mg via INTRAVENOUS
  Filled 2018-08-22: qty 1

## 2018-08-22 MED ORDER — SODIUM CHLORIDE 0.9 % IV SOLN
INTRAVENOUS | Status: AC
  Administered 2018-08-22: 10:00:00 via INTRAVENOUS
  Filled 2018-08-22 (×2): qty 250

## 2018-08-22 MED ORDER — HEPARIN SOD (PORK) LOCK FLUSH 100 UNIT/ML IV SOLN
500.0000 [IU] | Freq: Once | INTRAVENOUS | Status: AC | PRN
  Administered 2018-08-22: 11:00:00 500 [IU]
  Filled 2018-08-22: qty 5

## 2018-08-22 MED ORDER — DIPHENOXYLATE-ATROPINE 2.5-0.025 MG PO TABS
ORAL_TABLET | ORAL | Status: AC
  Filled 2018-08-22: qty 2

## 2018-08-22 MED ORDER — SODIUM CHLORIDE 0.9% FLUSH
10.0000 mL | INTRAVENOUS | Status: DC | PRN
  Administered 2018-08-22: 11:00:00 10 mL
  Filled 2018-08-22: qty 10

## 2018-08-22 MED ORDER — DEXAMETHASONE SODIUM PHOSPHATE 10 MG/ML IJ SOLN
10.0000 mg | Freq: Once | INTRAMUSCULAR | Status: AC
  Administered 2018-08-22: 10 mg via INTRAVENOUS

## 2018-08-22 MED ORDER — MORPHINE SULFATE (PF) 4 MG/ML IV SOLN
INTRAVENOUS | Status: AC
  Filled 2018-08-22: qty 1

## 2018-08-22 MED ORDER — DIPHENOXYLATE-ATROPINE 2.5-0.025 MG PO TABS: ORAL_TABLET | ORAL | 0 refills | Status: DC

## 2018-08-22 MED ORDER — SODIUM CHLORIDE 0.9 % IV SOLN: 10.0000 mg | Freq: Once | INTRAVENOUS | Status: DC

## 2018-08-22 MED ORDER — ONDANSETRON HCL 4 MG/2ML IJ SOLN
INTRAMUSCULAR | Status: AC
  Filled 2018-08-22: qty 4

## 2018-08-22 MED ORDER — DIPHENOXYLATE-ATROPINE 2.5-0.025 MG PO TABS
2.0000 | ORAL_TABLET | Freq: Once | ORAL | Status: AC
  Administered 2018-08-22: 10:00:00 2 via ORAL

## 2018-08-22 MED FILL — MORPHINE SULFATE IR 15 MG T: 15 | 15 days supply | Qty: 90 | Fill #0

## 2018-08-22 NOTE — Telephone Encounter (Signed)
No los per 8/6.

## 2018-08-22 NOTE — Telephone Encounter (Signed)
Patient calls stating she has not been able to keep anything down for two days, very dehydrated, was seen in ED yesterday and was told her colitis is back and was given an antibiotic can take because of N/V, abdominal pain has increased.  Told her to come in at 9:00 am to be seen by Sandi Mealy PA

## 2018-08-22 NOTE — Progress Notes (Signed)
Pt received 1 L IVF NS today, as well as antiemetics and antidiarrheal and pain medications.  Tolerated well.  Reports feeling better at end of tx with no current N/V/D and controlled pain.  A&Ox4.  Ambulatory w/steady gait to exit with belongings, states she feels well enough to drive herself home.  Verbalized understanding of d/c and f/u instructions.  Request for pain medication refill passed along to MD [facility] by PA Lucianne Lei.

## 2018-08-22 NOTE — Patient Instructions (Signed)

## 2018-08-23 NOTE — Progress Notes (Signed)
[facility] Progress Note   Jane Brooks [id number] [birth year] 48 y.o.  Zane Samson is managed by Dr. Truitt Merle  Actively treated with chemotherapy/immunotherapy/hormonal therapy: no  Next scheduled appointment with provider: 09/13/2018  Assessment: Plan:    Non-intractable vomiting with nausea, unspecified vomiting type - Plan: 0.9 %  sodium chloride infusion, ondansetron (ZOFRAN) injection 8 mg, dexamethasone (DECADRON) injection 10 mg, DISCONTINUED: dexamethasone (DECADRON) 10 mg in sodium chloride 0.9 % 50 mL IVPB  Lower abdominal pain - Plan: morphine 4 MG/ML injection 2 mg  Diarrhea, unspecified type - Plan: diphenoxylate-atropine (LOMOTIL) 2.5-0.025 MG per tablet 2 tablet, diphenoxylate-atropine (LOMOTIL) 2.5-0.025 MG tablet  Malignant neoplasm of head of pancreas ([place]) - Plan: heparin lock flush 100 unit/mL, sodium chloride flush (NS) 0.9 % injection 10 mL   Nausea and vomiting: The patient was given 1 liter of normal saline along with Zofran 8 mg IV x 1 and Decadron 10 mg IV x 1.  Lower abdominal pain: The patient was given morphine sulfate 2 mg IV x 1. Dr. Ernestina Penna nurse was made aware that the patient is nearly out of her short acting pain medications.  Diarrhea: The patient was given Lomotil 2 tablets PO x 1 today. Additionally she was given a prescription for Lomotil.  Malignant neoplasm of the head of the pancreas: The patient is status post a Whipple surgery. She most recently had a liver biopsy completed which returned with no evidence of malignancy.  She is scheduled to see Dr. Burr Medico in follow-up on 09/13/2018.  Please see After Visit Summary for patient specific instructions.  Future Appointments  Date Time Provider [place]  08/27/2018 10:30 AM [facility] NURSE [facility] None  08/27/2018 11:00 AM Kyung Rudd, MD [facility] None  09/13/2018  9:15 AM [facility] LAB 1 [facility] None  09/13/2018  9:30 AM [facility] FLUSH  [facility] None  09/13/2018 10:00 AM Truitt Merle, MD [facility] None    No orders of the defined types were placed in this encounter.      Subjective:   Patient ID:  Jane Brooks is a 48 y.o. (DOB 16-Jul-1970) female.  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  She is status post FOLFIRINOX x4 months which was followed by a Whipple's procedure on 04/25/2018.  Her surgical margins were positive with 1 lymph node positive.  A PET scan from 07/25/2018 showed a suspicious lesion of the right lobe of the liver.  She underwent a liver biopsy on 08/13/2018 which was negative for malignancy.  She presents to the office today having been seen in the emergency room on 08/21/2018 for abdominal pain.  She is seen today for nausea, vomiting, diarrhea, and chills.  She acknowledges a history of colitis.  She denies fevers, shortness of breath, cough, or other respiratory symptoms.  She reports that she was using more of her short acting pain medication around the time of her liver biopsy.  She is in need of a refill.  Medications: I have reviewed the patient's current medications.  Allergies:  Allergies  Allergen Reactions   Latex Hives    Past Medical History:  Diagnosis Date   Family history of lung cancer    Family history of prostate cancer    Family history of uterine cancer    Gallstones 10/2017   Pancreatic cancer ([place])  Pneumothorax, closed, traumatic    years ago   PONV (postoperative nausea and vomiting)     Past Surgical History:  Procedure Laterality Date   BILIARY STENT PLACEMENT  10/31/2017   Procedure: BILIARY STENT PLACEMENT;  Surgeon: [name], MD;  Location: [facility] ENDOSCOPY;  Service: Gastroenterology;;   ERCP N/A 10/31/2017   Procedure: ENDOSCOPIC RETROGRADE CHOLANGIOPANCREATOGRAPHY (ERCP);  Surgeon: [name], MD;  Location: [facility] ENDOSCOPY;  Service: Gastroenterology;  Laterality: N/A;   ESOPHAGOGASTRODUODENOSCOPY N/A 11/08/2017   Procedure: ESOPHAGOGASTRODUODENOSCOPY (EGD);  Surgeon: Milus Banister, MD;  Location: [facility] ENDOSCOPY;  Service: Endoscopy;  Laterality: N/A;   EUS N/A 11/08/2017   Procedure: UPPER ENDOSCOPIC ULTRASOUND (EUS) RADIAL;  Surgeon: Milus Banister, MD;  Location: [facility] ENDOSCOPY;  Service: Endoscopy;  Laterality: N/A;   FINE NEEDLE ASPIRATION  11/08/2017   Procedure: FINE NEEDLE ASPIRATION;  Surgeon: Milus Banister, MD;  Location: [facility] ENDOSCOPY;  Service: Endoscopy;;   IR IMAGING GUIDED PORT INSERTION  11/15/2017   LAPAROSCOPY N/A 04/25/2018   Procedure: LAPAROSCOPY DIAGNOSTIC;  Surgeon: Stark Klein, MD;  Location: [place];  Service: General;  Laterality: N/A;  GENERAL AND EPIDURAL ANESTHESIA   SPHINCTEROTOMY  10/31/2017   Procedure: SPHINCTEROTOMY;  Surgeon: [name], MD;  Location: [place];  Service: Gastroenterology;;   TONSILLECTOMY     WHIPPLE PROCEDURE N/A 04/25/2018   Procedure: WHIPPLE PROCEDURE, RECONSTRUCTION OF PORTAL VEIN;  Surgeon: Stark Klein, MD;  Location: [place];  Service: General;  Laterality: N/A;    Family History  Problem Relation Age of Onset   Diabetes Mother    Hypertension Mother    COPD Mother    Uterine cancer Mother 65       had hysterectomy   Diabetes Sister    Hypertension Sister    Lung cancer Maternal Grandmother        lung cancer   Hypertension Sister     Social History   Socioeconomic History   Marital status: Single    Spouse name: Not on file   Number of children: Not on file   Years of education: Not on file   Highest education level: Not on file  Occupational History   Occupation: unemployed  Social Designer, fashion/clothing strain: Not on file   Food insecurity    Worry: Not on file    Inability: Not on file   Transportation needs    Medical: Not on file    Non-medical: Not on  file  Tobacco Use   Smoking status: Former Smoker    Packs/day: 1.00    Years: 37.00    Pack years: 37.00    Types: Cigarettes    Quit date: 04/25/2018    Years since quitting: 0.3   Smokeless tobacco: Never Used  Substance and Sexual Activity   Alcohol use: Not Currently   Drug use: Never   Sexual activity: Not Currently  Lifestyle   Physical activity    Days per week: Not on file    Minutes per session: Not on file   Stress: Not on file  Relationships   Social connections    Talks on phone: Not on file    Gets together: Not on file    Attends religious service: Not on file    Active member of club or organization: Not on file    Attends meetings of clubs or organizations: Not on file    Relationship status: Not on file   Intimate partner violence    Fear  of current or ex partner: Not on file    Emotionally abused: Not on file    Physically abused: Not on file    Forced sexual activity: Not on file  Other Topics Concern   Not on file  Social History Narrative   Not on file    Past Medical History, Surgical history, Social history, and Family history were reviewed and updated as appropriate.   Please see review of systems for further details on the patient's review from today.   Review of Systems:  Review of Systems  Constitutional: Negative for activity change, appetite change, chills, diaphoresis and fever.  HENT: Negative for trouble swallowing.   Respiratory: Negative for cough, chest tightness and shortness of breath.   Cardiovascular: Negative for chest pain, palpitations and leg swelling.  Gastrointestinal: Positive for abdominal pain, diarrhea, nausea and vomiting. Negative for abdominal distention and constipation.  Genitourinary: Negative for difficulty urinating and dysuria.    Objective:   Physical Exam:  BP (!) 158/90 (BP Location: Left Arm, Patient Position: Sitting) Comment: Liza RN was notified   Pulse 95    Temp 97.8 F (36.6 C) (Oral)     Resp 17    Ht 4' 10"  (1.473 m)    Wt 95 lb 14.4 oz (43.5 kg)    SpO2 98%    BMI 20.04 kg/m  ECOG: 1  Physical Exam Constitutional:      General: She is not in acute distress.    Appearance: She is not diaphoretic.  HENT:     Head: Normocephalic and atraumatic.     Mouth/Throat:     Pharynx: No oropharyngeal exudate.  Eyes:     General: No scleral icterus.       Right eye: No discharge.        Left eye: No discharge.     Conjunctiva/sclera: Conjunctivae normal.  Neck:     Musculoskeletal: Normal range of motion and neck supple.  Cardiovascular:     Rate and Rhythm: Normal rate and regular rhythm.     Heart sounds: Normal heart sounds. No murmur. No friction rub. No gallop.   Pulmonary:     Effort: Pulmonary effort is normal. No respiratory distress.     Breath sounds: Normal breath sounds. No wheezing or rales.  Abdominal:     General: Bowel sounds are normal. There is no distension.     Palpations: Abdomen is soft. There is no mass.     Tenderness: There is abdominal tenderness (Lower abdomen.). There is no guarding or rebound.  Lymphadenopathy:     Cervical: No cervical adenopathy.  Skin:    General: Skin is warm and dry.     Findings: No erythema or rash.  Neurological:     Mental Status: She is alert.     Coordination: Coordination normal.  Psychiatric:        Behavior: Behavior normal.        Thought Content: Thought content normal.        Judgment: Judgment normal.     Lab Review:     Component Value Date/Time   NA 136 08/21/2018 1058   K 3.4 (L) 08/21/2018 1058   CL 100 08/21/2018 1058   CO2 18 (L) 08/21/2018 1058   GLUCOSE 183 (H) 08/21/2018 1058   BUN 5 (L) 08/21/2018 1058   CREATININE 0.59 08/21/2018 1058   CREATININE 0.57 08/16/2018 1233   CALCIUM 9.3 08/21/2018 1058   PROT 7.9 08/21/2018 1058   ALBUMIN 3.4 (L)  08/21/2018 1058   AST 51 (H) 08/21/2018 1058   AST 56 (H) 08/16/2018 1233   ALT 39 08/21/2018 1058   ALT 56 (H) 08/16/2018 1233    ALKPHOS 197 (H) 08/21/2018 1058   BILITOT 0.6 08/21/2018 1058   BILITOT 0.6 08/16/2018 1233   GFRNONAA >60 08/21/2018 1058   GFRNONAA >60 08/16/2018 1233   GFRAA >60 08/21/2018 1058   GFRAA >60 08/16/2018 1233       Component Value Date/Time   WBC 10.4 08/21/2018 1058   RBC 5.90 (H) 08/21/2018 1058   HGB 12.0 08/21/2018 1058   HGB 9.2 (L) 08/16/2018 1233   HCT 38.1 08/21/2018 1058   PLT 474 (H) 08/21/2018 1058   PLT 280 08/16/2018 1233   MCV 64.6 (L) 08/21/2018 1058   MCH 20.3 (L) 08/21/2018 1058   MCHC 31.5 08/21/2018 1058   RDW 19.8 (H) 08/21/2018 1058   LYMPHSABS 2.2 08/16/2018 1233   MONOABS 0.7 08/16/2018 1233   EOSABS 0.2 08/16/2018 1233   BASOSABS 0.0 08/16/2018 1233   -------------------------------  Imaging from last 24 hours (if applicable):  Radiology interpretation: Ct Abdomen Pelvis W Contrast  Result Date: 08/21/2018 CLINICAL DATA:  Abdominal distension, generalized abdominal pain EXAM: CT ABDOMEN AND PELVIS WITH CONTRAST TECHNIQUE: Multidetector CT imaging of the abdomen and pelvis was performed using the standard protocol following bolus administration of intravenous contrast. CONTRAST:  12m OMNIPAQUE IOHEXOL 300 MG/ML  SOLN COMPARISON:  07/06/2018 FINDINGS: Lower chest: No acute abnormality. Hepatobiliary: Diffuse hepatic steatosis. Heterogeneous area of non masslike enhancement within the right hepatic lobe is similar to prior CT. Mild pneumobilia, slightly less prominent compared to prior. No intrahepatic biliary ductal dilatation is appreciated. Pancreas: Status post Whipple procedure. No discrete recurrent mass is identified. Unremarkable appearance to the pancreatic tail. Spleen: Normal in size without focal abnormality. Adrenals/Urinary Tract: Unremarkable adrenal glands. Kidneys enhance symmetrically without focal lesion, calculus, or hydronephrosis. Urinary bladder is unremarkable for the degree of distention. Stomach/Bowel: Diffuse wall thickening and  submucosal edema throughout the colon, similar in appearance to prior. For there is been interval improvement in the degree of thickening and submucosal edema within the stomach. Is or no dilated loops of bowel. No definite inflammatory changes. Vascular/Lymphatic: Scattered aortoiliac vascular calcifications. No lymphadenopathy identified in the abdomen or pelvis. Reproductive: Uterus and bilateral adnexa are unremarkable. Other: Small volume ascites, similar to prior. Musculoskeletal: No acute or significant osseous findings. IMPRESSION: 1. Diffuse colonic wall thickening and submucosal edema suggestive of colitis, findings are similar to prior study. 2. Interval improvement in the degree of gastric wall thickening and edema compared to prior CT. 3. Small volume ascites, unchanged. 4. Diffuse hepatic steatosis with grossly stable area of non masslike enhancement in the right hepatic lobe. Continued imaging surveillance of this finding is suggested. Electronically Signed   By: NDavina PokeM.D.   On: 08/21/2018 15:18   Nm Pet Image Initial (pi) Skull Base To Thigh  Result Date: 07/25/2018 CLINICAL DATA:  Initial treatment strategy for malignant neoplasm of head of pancreas. Status post Whipple procedure on 04/25/2018. EXAM: NUCLEAR MEDICINE PET SKULL BASE TO THIGH TECHNIQUE: 5.0 mCi F-18 FDG was injected intravenously. Full-ring PET imaging was performed from the skull base to thigh after the radiotracer. CT data was obtained and used for attenuation correction and anatomic localization. Fasting blood glucose: 82 mg/dl COMPARISON:  Abdominopelvic CT 07/06/2018. Abdominal MRI of 10/30/2017. chest CT 05/03/2018. FINDINGS: Mediastinal blood pool activity: SUV max 1.8 Liver activity: SUV max NA NECK:  No areas of abnormal hypermetabolism. Incidental CT findings: No cervical adenopathy. CHEST: Bilateral small axillary nodes with low-level hypermetabolism. Example on the left at a S.U.V. max of 1.8. Favored to be  reactive. No pulmonary parenchymal hypermetabolism. Incidental CT findings: Right Port-A-Cath tip high right atrium. Right coronary artery calcification. ABDOMEN/PELVIS: A focus of moderate right hepatic lobe hypermetabolism is in the region of heterogeneous sparing of steatosis. Example at a S.U.V. max of 7.1 on approximately image 98/4. Foci of more mild, amorphous hypermetabolism within the hepatic dome are indeterminate, including on image 86/4. Foci of mild to moderate hypermetabolism are identified within the porta hepatis, along the medial right hepatic border. No well-defined adenopathy in these areas. Example at a S.U.V. max of 4.4 on image 102/4 and a S.U.V. max of 3.5 on image 98/4. Incidental CT findings: Marked hepatic steatosis. Status post Whipple procedure. Abdominal aortic atherosclerosis. Normal adrenal glands. Peritoneal edema without well-defined peritoneal/omental mass. SKELETON: No abnormal marrow activity. Incidental CT findings: Remote left rib fractures. IMPRESSION: 1. Right hepatic lobe hypermetabolism, in the setting of heterogeneous hepatic steatosis. Finding is highly suspicious for isolated hepatic metastasis. 2. More equivocal lower level hypermetabolism along the medial hepatic capsule and within the hepatic dome. These areas could be postoperative and physiologic respectively. Recommend attention on follow-up. 3. Coronary artery atherosclerosis. Aortic Atherosclerosis (ICD10-I70.0). Electronically Signed   By: Abigail Miyamoto M.D.   On: 07/25/2018 09:33   US Abdomen Limited  Result Date: 08/13/2018 CLINICAL DATA:  Pancreas CA, s/p Whipple, PET positive right inferior hepatic lesion, assess for biopsy EXAM: ULTRASOUND ABDOMEN LIMITED COMPARISON:  07/06/2018, 07/25/2018 FINDINGS: Ultrasound performed of the liver in preparation for biopsy. The liver is very heterogeneous related to the advanced hepatic steatosis with areas of fatty sparing. The right inferior hepatic abnormality which  is PET positive is not clearly demarcated by ultrasound and therefore difficult to target. Therefore biopsy cannot be performed with ultrasound. This will be attempted with CT. IMPRESSION: Heterogeneous liver as detailed above. Right inferior PET positive lesion is not confidently visualized by ultrasound. Patient will be taken to CT for attempted biopsy. Electronically Signed   By: Jerilynn Mages.  Shick M.D.   On: 08/13/2018 14:39   Ct Biopsy  Result Date: 08/13/2018 INDICATION: Pancreas cancer, status post Whipple, right inferior PET positive hepatic abnormality EXAM: CT biopsy right inferior ill-defined hepatic lesion MEDICATIONS: 1% lidocaine local ANESTHESIA/SEDATION: Moderate (conscious) sedation was employed during this procedure. A total of Versed 3.0 mg and Fentanyl 100 mcg was administered intravenously. Moderate Sedation Time: 13 minutes. The patient's level of consciousness and vital signs were monitored continuously by radiology nursing throughout the procedure under my direct supervision. FLUOROSCOPY TIME:  Fluoroscopy Time: None. COMPLICATIONS: None immediate. PROCEDURE: Informed written consent was obtained from the patient after a thorough discussion of the procedural risks, benefits and alternatives. All questions were addressed. Maximal Sterile Barrier Technique was utilized including caps, mask, sterile gowns, sterile gloves, sterile drape, hand hygiene and skin antiseptic. A timeout was performed prior to the initiation of the procedure. The PET CT right inferior hepatic abnormality was not visualized by ultrasound. Therefore the patient was taken the CT. Patient was positioned supine. Noncontrast localization CT performed. The CT was correlated with the PET-CT. Overlying skin marked for a lateral inferior approach through a lower intercostal space. Under sterile conditions and local anesthesia, a 17 gauge access needle was advanced percutaneously to the lesion. Needle position confirmed with CT and  correlated with the PET-CT. 18 gauge core biopsies obtained.  Samples placed in formalin. Needle tract occluded with Gel-Foam. Patient tolerated the biopsy well. Postprocedure imaging demonstrates no hemorrhage or hematoma. IMPRESSION: Successful CT-guided right inferior hepatic lesion 18 gauge core biopsy. Electronically Signed   By: Jerilynn Mages.  Shick M.D.   On: 08/13/2018 15:49   Vas [place] Lower Extremity Venous (dvt)  Result Date: 07/25/2018  Lower Venous Study Indications: Swelling.  Comparison Study: No prior Performing Technologist: Abram Sander RVS  Examination Guidelines: A complete evaluation includes B-mode imaging, spectral Doppler, color Doppler, and power Doppler as needed of all accessible portions of each vessel. Bilateral testing is considered an integral part of a complete examination. Limited examinations for reoccurring indications may be performed as noted.  +-----+---------------+---------+-----------+----------+-------+  RIGHT Compressibility Phasicity Spontaneity Properties Summary  +-----+---------------+---------+-----------+----------+-------+  CFV   Full            Yes       Yes                             +-----+---------------+---------+-----------+----------+-------+   +---------+---------------+---------+-----------+----------+---------------+  LEFT      Compressibility Phasicity Spontaneity Properties Summary          +---------+---------------+---------+-----------+----------+---------------+  CFV       Full            Yes       Yes                    thickened walls  +---------+---------------+---------+-----------+----------+---------------+  SFJ       Full                                             thickened walls  +---------+---------------+---------+-----------+----------+---------------+  FV Prox   Full                                                              +---------+---------------+---------+-----------+----------+---------------+  FV Mid    Full                                                               +---------+---------------+---------+-----------+----------+---------------+  FV Distal Full                                                              +---------+---------------+---------+-----------+----------+---------------+  PFV       Full                                                              +---------+---------------+---------+-----------+----------+---------------+  POP  Full            Yes       Yes                                     +---------+---------------+---------+-----------+----------+---------------+  PTV       Full                                                              +---------+---------------+---------+-----------+----------+---------------+  PERO      Full                                                              +---------+---------------+---------+-----------+----------+---------------+     Summary: Right: No evidence of common femoral vein obstruction. Left: There is no evidence of deep vein thrombosis in the lower extremity. No cystic structure found in the popliteal fossa.  *See table(s) above for measurements and observations. Electronically signed by Servando Snare MD on 07/25/2018 at 5:09:21 PM.    Final

## 2018-08-26 NOTE — Progress Notes (Signed)
error 

## 2018-08-27 ENCOUNTER — Ambulatory Visit
Admission: RE | Admit: 2018-08-27 | Discharge: 2018-08-27 | Disposition: A | Discharge status: Home / Self Care | Payer: Medicaid Other | Source: Ambulatory Visit | Attending: Radiation Oncology | Admitting: Radiation Oncology

## 2018-08-29 ENCOUNTER — Telehealth: Payer: Self-pay | Admitting: Radiation Oncology

## 2018-08-29 NOTE — Telephone Encounter (Signed)
New message:   LVM for patient to return call to R/S appt with Dr. Kyung Rudd.

## 2018-08-30 ENCOUNTER — Telehealth: Payer: Self-pay | Admitting: Radiation Oncology

## 2018-08-30 NOTE — Telephone Encounter (Signed)
New message:   LVM for patient to return call to R/S missed appt from yesterday.

## 2018-09-02 ENCOUNTER — Telehealth: Payer: Self-pay | Admitting: *Deleted

## 2018-09-02 ENCOUNTER — Other Ambulatory Visit: Payer: Self-pay | Admitting: *Deleted

## 2018-09-02 ENCOUNTER — Other Ambulatory Visit: Payer: Self-pay | Admitting: Hematology

## 2018-09-02 MED ORDER — MORPHINE SULFATE ER 15 MG PO TBCR: 15.0000 mg | EXTENDED_RELEASE_TABLET | Freq: Every day | ORAL | 0 refills | Status: DC

## 2018-09-02 MED ORDER — GABAPENTIN 300 MG PO CAPS: 300.0000 mg | ORAL_CAPSULE | Freq: Three times a day (TID) | ORAL | 1 refills | Status: DC

## 2018-09-02 MED FILL — MORPHINE SULF ER 15 MG TAB: 15 | 20 days supply | Qty: 20 | Fill #0

## 2018-09-02 MED FILL — GABAPENTIN 300 MG CAPSULE: 300 | 30 days supply | Qty: 90 | Fill #0

## 2018-09-02 MED FILL — POTASSIUM CHLORIDE CRYS ER: 20 | 30 days supply | Qty: 30 | Fill #1

## 2018-09-02 NOTE — Telephone Encounter (Signed)
Received call from pt stating that she needs refill on her long acting pain med & she is taking it daily.  Script to go to [facility].  She also said that Dr Burr Medico was going to call her in some neurontin..  Notes script sent to [facility].  She would like script to go to [facility].  Will change script.  Message to Dr Burr Medico for pain med.

## 2018-09-02 NOTE — Telephone Encounter (Signed)
I refilled #20, plan to wean off in the next month. No more refills. Thanks   Truitt Merle MD

## 2018-09-10 ENCOUNTER — Other Ambulatory Visit: Payer: Self-pay | Admitting: Hematology

## 2018-09-10 ENCOUNTER — Telehealth: Payer: Self-pay

## 2018-09-10 MED ORDER — MORPHINE SULFATE 15 MG PO TABS: 15.0000 mg | ORAL_TABLET | Freq: Four times a day (QID) | ORAL | 0 refills | Status: DC | PRN

## 2018-09-10 MED FILL — MORPHINE SULFATE IR 15 MG T: 15 | 15 days supply | Qty: 60 | Fill #0

## 2018-09-10 NOTE — Telephone Encounter (Signed)
Patient calls for refill on her short acting Morphine, last filled 8/6 #90. She is requesting this be sent into [facility] pharmacy on file.

## 2018-09-10 NOTE — Telephone Encounter (Signed)
I refilled 2 weeks supply #60, and will discuss pain management with her on her next appointment this Friday. Thanks   Truitt Merle MD

## 2018-09-10 NOTE — Telephone Encounter (Signed)
Spoke with patient regarding short acting Morphine, per Dr. Burr Medico explained she sent is #60 2 weeks supply to the pharmacy, and we will discuss pain management with her on her visit on Friday.  She verbalized an understanding.
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
## 2018-09-13 ENCOUNTER — Encounter: Payer: Self-pay | Admitting: Hematology

## 2018-09-13 ENCOUNTER — Other Ambulatory Visit: Payer: Self-pay

## 2018-09-13 ENCOUNTER — Telehealth: Payer: Self-pay | Admitting: Hematology

## 2018-09-13 ENCOUNTER — Inpatient Hospital Stay: Payer: Medicaid Other

## 2018-09-13 ENCOUNTER — Inpatient Hospital Stay (HOSPITAL_BASED_OUTPATIENT_CLINIC_OR_DEPARTMENT_OTHER): Payer: Medicaid Other | Admitting: Hematology

## 2018-09-13 VITALS — BP 98/71 | HR 73 | Temp 98.6°F | Resp 18 | Ht <= 58 in | Wt 96.3 lb

## 2018-09-13 DIAGNOSIS — C250 Malignant neoplasm of head of pancreas: Secondary | ICD-10-CM

## 2018-09-13 DIAGNOSIS — I10 Essential (primary) hypertension: Secondary | ICD-10-CM

## 2018-09-13 DIAGNOSIS — C257 Malignant neoplasm of other parts of pancreas: Secondary | ICD-10-CM

## 2018-09-13 LAB — CBC WITH DIFFERENTIAL (CANCER CENTER ONLY)
Abs Immature Granulocytes: 0.03 10*3/uL (ref 0.00–0.07)
Basophils Absolute: 0 10*3/uL (ref 0.0–0.1)
Basophils Relative: 0 %
Eosinophils Absolute: 0.1 10*3/uL (ref 0.0–0.5)
Eosinophils Relative: 2 %
HCT: 29 % — ABNORMAL LOW (ref 36.0–46.0)
Hemoglobin: 9.5 g/dL — ABNORMAL LOW (ref 12.0–15.0)
Immature Granulocytes: 1 %
Lymphocytes Relative: 37 %
Lymphs Abs: 2.2 10*3/uL (ref 0.7–4.0)
MCH: 20.3 pg — ABNORMAL LOW (ref 26.0–34.0)
MCHC: 32.8 g/dL (ref 30.0–36.0)
MCV: 62.1 fL — ABNORMAL LOW (ref 80.0–100.0)
Monocytes Absolute: 0.4 10*3/uL (ref 0.1–1.0)
Monocytes Relative: 7 %
Neutro Abs: 3.3 10*3/uL (ref 1.7–7.7)
Neutrophils Relative %: 53 %
Platelet Count: 223 10*3/uL (ref 150–400)
RBC: 4.67 MIL/uL (ref 3.87–5.11)
RDW: 17.9 % — ABNORMAL HIGH (ref 11.5–15.5)
WBC Count: 6.1 10*3/uL (ref 4.0–10.5)
nRBC: 0 % (ref 0.0–0.2)

## 2018-09-13 LAB — CMP (CANCER CENTER ONLY)
ALT: 34 U/L (ref 0–44)
AST: 35 U/L (ref 15–41)
Albumin: 2.9 g/dL — ABNORMAL LOW (ref 3.5–5.0)
Alkaline Phosphatase: 142 U/L — ABNORMAL HIGH (ref 38–126)
Anion gap: 7 (ref 5–15)
BUN: 13 mg/dL (ref 6–20)
CO2: 28 mmol/L (ref 22–32)
Calcium: 8 mg/dL — ABNORMAL LOW (ref 8.9–10.3)
Chloride: 103 mmol/L (ref 98–111)
Creatinine: 0.62 mg/dL (ref 0.44–1.00)
GFR, Est AFR Am: >60 mL/min (ref >60)
GFR, Estimated: >60 mL/min (ref >60)
Glucose, Bld: 82 mg/dL (ref 70–99)
Potassium: 3.9 mmol/L (ref 3.5–5.1)
Sodium: 138 mmol/L (ref 135–145)
Total Bilirubin: 0.5 mg/dL (ref 0.3–1.2)
Total Protein: 5.8 g/dL — ABNORMAL LOW (ref 6.5–8.1)

## 2018-09-13 MED ORDER — DRONABINOL 2.5 MG PO CAPS: 2.5000 mg | ORAL_CAPSULE | Freq: Two times a day (BID) | ORAL | 0 refills | Status: DC

## 2018-09-13 MED ORDER — SODIUM CHLORIDE 0.9% FLUSH
10.0000 mL | INTRAVENOUS | Status: DC | PRN
  Administered 2018-09-13: 10 mL
  Filled 2018-09-13: qty 10

## 2018-09-13 MED ORDER — HEPARIN SOD (PORK) LOCK FLUSH 100 UNIT/ML IV SOLN
500.0000 [IU] | Freq: Once | INTRAVENOUS | Status: AC | PRN
  Administered 2018-09-13: 10:00:00 500 [IU]
  Filled 2018-09-13: qty 5

## 2018-09-13 MED FILL — DRONABINOL 2.5 MG CAPSULE: 2.5 | 15 days supply | Qty: 30 | Fill #0

## 2018-09-13 NOTE — Patient Instructions (Signed)

## 2018-09-13 NOTE — Telephone Encounter (Signed)
Scheduled appt per 8/28 los.  Spoke with patient and she is aware of her appt date and time.

## 2018-09-14 LAB — CANCER ANTIGEN 19-9: CA 19-9: 30 U/mL (ref 0–35)

## 2018-09-22 ENCOUNTER — Emergency Department (HOSPITAL_COMMUNITY)
Admission: EM | Admit: 2018-09-22 | Discharge: 2018-09-22 | Disposition: A | Discharge status: Home / Self Care | Payer: Medicaid Other | Attending: Emergency Medicine | Admitting: Emergency Medicine

## 2018-09-22 ENCOUNTER — Other Ambulatory Visit: Payer: Self-pay

## 2018-09-22 ENCOUNTER — Encounter (HOSPITAL_COMMUNITY): Payer: Self-pay | Admitting: Emergency Medicine

## 2018-09-22 DIAGNOSIS — Z87891 Personal history of nicotine dependence: Secondary | ICD-10-CM | POA: Insufficient documentation

## 2018-09-22 DIAGNOSIS — R197 Diarrhea, unspecified: Secondary | ICD-10-CM | POA: Diagnosis not present

## 2018-09-22 DIAGNOSIS — R1013 Epigastric pain: Secondary | ICD-10-CM | POA: Insufficient documentation

## 2018-09-22 DIAGNOSIS — Z91040 Latex allergy status: Secondary | ICD-10-CM | POA: Diagnosis not present

## 2018-09-22 DIAGNOSIS — R109 Unspecified abdominal pain: Secondary | ICD-10-CM

## 2018-09-22 DIAGNOSIS — R1010 Upper abdominal pain, unspecified: Secondary | ICD-10-CM | POA: Diagnosis present

## 2018-09-22 DIAGNOSIS — D509 Iron deficiency anemia, unspecified: Secondary | ICD-10-CM | POA: Insufficient documentation

## 2018-09-22 DIAGNOSIS — I10 Essential (primary) hypertension: Secondary | ICD-10-CM | POA: Insufficient documentation

## 2018-09-22 DIAGNOSIS — C259 Malignant neoplasm of pancreas, unspecified: Secondary | ICD-10-CM | POA: Diagnosis not present

## 2018-09-22 DIAGNOSIS — Z79899 Other long term (current) drug therapy: Secondary | ICD-10-CM | POA: Insufficient documentation

## 2018-09-22 LAB — COMPREHENSIVE METABOLIC PANEL
ALT: 35 U/L (ref 0–44)
AST: 43 U/L — ABNORMAL HIGH (ref 15–41)
Albumin: 2.6 g/dL — ABNORMAL LOW (ref 3.5–5.0)
Alkaline Phosphatase: 121 U/L (ref 38–126)
Anion gap: 10 (ref 5–15)
BUN: 12 mg/dL (ref 6–20)
CO2: 22 mmol/L (ref 22–32)
Calcium: 8.1 mg/dL — ABNORMAL LOW (ref 8.9–10.3)
Chloride: 103 mmol/L (ref 98–111)
Creatinine, Ser: 0.66 mg/dL (ref 0.44–1.00)
GFR calc Af Amer: >60 mL/min (ref >60)
GFR calc non Af Amer: >60 mL/min (ref >60)
Glucose, Bld: 133 mg/dL — ABNORMAL HIGH (ref 70–99)
Potassium: 3.8 mmol/L (ref 3.5–5.1)
Sodium: 135 mmol/L (ref 135–145)
Total Bilirubin: 0.6 mg/dL (ref 0.3–1.2)
Total Protein: 5.8 g/dL — ABNORMAL LOW (ref 6.5–8.1)

## 2018-09-22 LAB — CBC WITH DIFFERENTIAL/PLATELET
Abs Immature Granulocytes: 0.03 10*3/uL (ref 0.00–0.07)
Basophils Absolute: 0 10*3/uL (ref 0.0–0.1)
Basophils Relative: 0 %
Eosinophils Absolute: 0 10*3/uL (ref 0.0–0.5)
Eosinophils Relative: 0 %
HCT: 33.1 % — ABNORMAL LOW (ref 36.0–46.0)
Hemoglobin: 10.9 g/dL — ABNORMAL LOW (ref 12.0–15.0)
Immature Granulocytes: 0 %
Lymphocytes Relative: 21 %
Lymphs Abs: 1.5 10*3/uL (ref 0.7–4.0)
MCH: 20.5 pg — ABNORMAL LOW (ref 26.0–34.0)
MCHC: 32.9 g/dL (ref 30.0–36.0)
MCV: 62.1 fL — ABNORMAL LOW (ref 80.0–100.0)
Monocytes Absolute: 0.3 10*3/uL (ref 0.1–1.0)
Monocytes Relative: 5 %
Neutro Abs: 5.1 10*3/uL (ref 1.7–7.7)
Neutrophils Relative %: 74 %
Platelets: 244 10*3/uL (ref 150–400)
RBC: 5.33 MIL/uL — ABNORMAL HIGH (ref 3.87–5.11)
RDW: 18.2 % — ABNORMAL HIGH (ref 11.5–15.5)
WBC: 7 10*3/uL (ref 4.0–10.5)
nRBC: 0.3 % — ABNORMAL HIGH (ref 0.0–0.2)

## 2018-09-22 LAB — C DIFFICILE QUICK SCREEN W PCR REFLEX
C Diff antigen: NEGATIVE
C Diff interpretation: NOT DETECTED
C Diff toxin: NEGATIVE

## 2018-09-22 LAB — LIPASE, BLOOD: Lipase: 16 U/L (ref 11–51)

## 2018-09-22 MED ORDER — ONDANSETRON HCL 4 MG/2ML IJ SOLN
4.0000 mg | Freq: Once | INTRAMUSCULAR | Status: AC
  Administered 2018-09-22: 4 mg via INTRAVENOUS
  Filled 2018-09-22: qty 2

## 2018-09-22 MED ORDER — HYDROMORPHONE HCL 2 MG PO TABS: 2.0000 mg | ORAL_TABLET | ORAL | 0 refills | Status: DC | PRN

## 2018-09-22 MED ORDER — HEPARIN SOD (PORK) LOCK FLUSH 100 UNIT/ML IV SOLN
500.0000 [IU] | Freq: Once | INTRAVENOUS | Status: DC
  Filled 2018-09-22: qty 5

## 2018-09-22 MED ORDER — HYDROMORPHONE HCL 1 MG/ML IJ SOLN
1.0000 mg | Freq: Once | INTRAMUSCULAR | Status: AC
  Administered 2018-09-22: 06:00:00 1 mg via INTRAVENOUS
  Filled 2018-09-22: qty 1

## 2018-09-22 MED ORDER — HYDROMORPHONE HCL 1 MG/ML IJ SOLN
2.0000 mg | Freq: Once | INTRAMUSCULAR | Status: AC
  Administered 2018-09-22: 09:00:00 2 mg via INTRAVENOUS
  Filled 2018-09-22: qty 2

## 2018-09-22 MED ORDER — SODIUM CHLORIDE 0.9 % IV BOLUS
1000.0000 mL | Freq: Once | INTRAVENOUS | Status: AC
  Administered 2018-09-22: 1000 mL via INTRAVENOUS

## 2018-09-22 MED ORDER — HEPARIN SOD (PORK) LOCK FLUSH 100 UNIT/ML IV SOLN
500.0000 [IU] | Freq: Once | INTRAVENOUS | Status: AC
  Administered 2018-09-22: 500 [IU]
  Filled 2018-09-22: qty 5

## 2018-09-22 NOTE — ED Provider Notes (Signed)
48 yo female with pancreatic cancer presents with pain in epigastric region, some loose stools (c dif pending) and urinary urgency.  Check labs and pain control.  May be d/c IV dilaudid Home ms 15 mg q 12 hours with break through meds Feels improved after Dilaudid IV.  She requests some doses of p.o. Dilaudid to supplement her home medications.  I will write for 3 pills. Patient with C. difficile results pending.  She is instructed to follow-up with [place] on my chart.   Pattricia Boss, MD 09/22/18 1006

## 2018-09-22 NOTE — ED Provider Notes (Signed)
[facility] EMERGENCY DEPARTMENT Provider Note   CSN: [id number] Arrival date & time: 09/22/18  7416    History   Chief Complaint Chief Complaint  Patient presents with  . Abdominal Pain    HPI Jane Brooks is a 48 y.o. female.   The history is provided by the patient.  She has history of pancreatic cancer status post Whipple procedure, hypertension and comes in with worsening abdominal pain and diarrhea.  She has been having constant upper abdominal pain, but it got significantly worse over the last 24 hours.  Pain is in the epigastric area and does not radiate.  She rates pain at 8/10.  She has been taking morphine for pain with only minimal relief.  She also has been having worsening diarrhea.  She has chronic diarrhea since her surgery, but she now is having some urge incontinence.  She thinks she has seen a couple of streaks of blood in the stool on a couple of occasions.  She denies fever, chills, sweats.  She denies nausea or vomiting.  Past Medical History:  Diagnosis Date  . Family history of lung cancer   . Family history of prostate cancer   . Family history of uterine cancer   . Gallstones 10/2017  . Pancreatic cancer ([place])   . Pneumothorax, closed, traumatic    years ago  . PONV (postoperative nausea and vomiting)     Patient Active Problem List   Diagnosis Date Noted  . Hospital discharge follow-up 06/29/2018  . Nausea & vomiting 06/29/2018  . Colitis 06/15/2018  . Hypokalemia 05/22/2018  . Peripheral edema   . Prediabetes   . Acute blood loss anemia   . Thrombocytopenia ([place])   . Urinary retention   . Sinus tachycardia   . Debility 05/16/2018  . Acute respiratory failure with hypoxemia ([place]) 04/30/2018  . HCAP (healthcare-associated pneumonia) 04/30/2018  . Essential hypertension 04/30/2018  . Chronic prescription opiate use 04/30/2018  . Postoperative pain 04/30/2018  . Tachycardia 04/30/2018  . Tachypnea 04/30/2018  . Acute  respiratory alkalosis 04/30/2018  . Adenocarcinoma of head of pancreas ([place]) 04/25/2018  . Genetic testing 01/24/2018  . Family history of uterine cancer   . Family history of prostate cancer   . Family history of lung cancer   . Pancreatic cancer ([place]) 11/09/2017  . Pancreatic mass   . Cholecystitis 10/30/2017  . Jaundice 10/30/2017    Past Surgical History:  Procedure Laterality Date  . BILIARY STENT PLACEMENT  10/31/2017   Procedure: BILIARY STENT PLACEMENT;  Surgeon: [name], MD;  Location: [facility] ENDOSCOPY;  Service: Gastroenterology;;  . ERCP N/A 10/31/2017   Procedure: ENDOSCOPIC RETROGRADE CHOLANGIOPANCREATOGRAPHY (ERCP);  Surgeon: [name], MD;  Location: [facility] ENDOSCOPY;  Service: Gastroenterology;  Laterality: N/A;  . ESOPHAGOGASTRODUODENOSCOPY N/A 11/08/2017   Procedure: ESOPHAGOGASTRODUODENOSCOPY (EGD);  Surgeon: Milus Banister, MD;  Location: [facility] ENDOSCOPY;  Service: Endoscopy;  Laterality: N/A;  . EUS N/A 11/08/2017   Procedure: UPPER ENDOSCOPIC ULTRASOUND (EUS) RADIAL;  Surgeon: Milus Banister, MD;  Location: [facility] ENDOSCOPY;  Service: Endoscopy;  Laterality: N/A;  . FINE NEEDLE ASPIRATION  11/08/2017   Procedure: FINE NEEDLE ASPIRATION;  Surgeon: Milus Banister, MD;  Location: [facility] ENDOSCOPY;  Service: Endoscopy;;  . IR IMAGING GUIDED PORT INSERTION  11/15/2017  . LAPAROSCOPY N/A 04/25/2018   Procedure: LAPAROSCOPY DIAGNOSTIC;  Surgeon: Stark Klein, MD;  Location: [facility];  Service: General;  Laterality: N/A;  GENERAL AND EPIDURAL ANESTHESIA  . SPHINCTEROTOMY  10/31/2017  Procedure: SPHINCTEROTOMY;  Surgeon: [name], MD;  Location: [facility] ENDOSCOPY;  Service: Gastroenterology;;  . TONSILLECTOMY    . WHIPPLE PROCEDURE N/A 04/25/2018   Procedure: WHIPPLE PROCEDURE, RECONSTRUCTION OF PORTAL VEIN;  Surgeon: Stark Klein, MD;  Location: [facility];  Service: General;  Laterality: N/A;     OB History   No obstetric history on file.      Home Medications    Prior to  Admission medications   Medication Sig Start Date End Date Taking? Authorizing Provider  acetaminophen (TYLENOL) 325 MG tablet Take 1-2 tablets (325-650 mg total) by mouth every 4 (four) hours as needed for mild pain. 05/21/18   Love, Ivan Anchors, PA-C  diphenoxylate-atropine (LOMOTIL) 2.5-0.025 MG tablet Take 1 to 2 tablets PO QID prn diarrhea 08/22/18   Tanner, Lyndon Code., PA-C  dronabinol (MARINOL) 2.5 MG capsule Take 1 capsule (2.5 mg total) by mouth 2 (two) times daily before lunch and supper. 09/13/18   Truitt Merle, MD  gabapentin (NEURONTIN) 300 MG capsule Take 1 capsule (300 mg total) by mouth 3 (three) times daily. 09/02/18   Truitt Merle, MD  lipase/protease/amylase (CREON) 36000 UNITS CPEP capsule Take 36,000 Units by mouth 3 (three) times daily before meals.    [provider]  metoCLOPramide (REGLAN) 5 MG tablet Take 1 tablet (5 mg total) by mouth every 6 (six) hours as needed for nausea or vomiting. 07/23/18   Alla Feeling, NP  morphine (MS CONTIN) 15 MG 12 hr tablet Take 1 tablet (15 mg total) by mouth daily. Plan to wean off in the next month 09/02/18   Truitt Merle, MD  morphine (MSIR) 15 MG tablet Take 1 tablet (15 mg total) by mouth every 6 (six) hours as needed for severe pain. 09/10/18   Truitt Merle, MD  ondansetron (ZOFRAN) 8 MG tablet Take 1 tablet (8 mg total) by mouth every 8 (eight) hours as needed for nausea or vomiting. 07/06/18   Deno Etienne, DO  pantoprazole (PROTONIX) 40 MG tablet Take 1 tablet (40 mg total) by mouth daily. 08/20/18   Truitt Merle, MD  potassium chloride SA (K-DUR) 20 MEQ tablet Take 1 tablet (20 mEq total) by mouth daily. 07/23/18   Alla Feeling, NP  promethazine (PHENERGAN) 25 MG tablet Take 1 tablet (25 mg total) by mouth every 6 (six) hours as needed for nausea or vomiting. 06/19/18   Stark Klein, MD    Family History Family History  Problem Relation Age of Onset  . Diabetes Mother   . Hypertension Mother   . COPD Mother   . Uterine cancer Mother 69       had  hysterectomy  . Diabetes Sister   . Hypertension Sister   . Lung cancer Maternal Grandmother        lung cancer  . Hypertension Sister     Social History Social History   Tobacco Use  . Smoking status: Former Smoker    Packs/day: 1.00    Years: 37.00    Pack years: 37.00    Types: Cigarettes    Quit date: 04/25/2018    Years since quitting: 0.4  . Smokeless tobacco: Never Used  Substance Use Topics  . Alcohol use: Not Currently  . Drug use: Never     Allergies   Latex   Review of Systems Review of Systems  All other systems reviewed and are negative.    Physical Exam Updated Vital Signs BP (!) 129/98 (BP Location: Right Arm)   Pulse Marland Kitchen)  105   Temp 97.8 F (36.6 C) (Oral)   Resp 20   Ht 4' 10"  (1.473 m)   Wt 40.8 kg   SpO2 100%   BMI 18.81 kg/m   Physical Exam Vitals signs and nursing note reviewed.    47 year old female, resting comfortably and in no acute distress. Vital signs are significant for elevated blood pressure and rapid heart rate. Oxygen saturation is 100%, which is normal. Head is normocephalic and atraumatic. PERRLA, EOMI. Oropharynx is clear. Neck is nontender and supple without adenopathy or JVD. Back is nontender and there is no CVA tenderness. Lungs are clear without rales, wheezes, or rhonchi. Chest is nontender. Heart has regular rate and rhythm without murmur. Abdomen is soft, flat, with moderate to severe epigastric tenderness.  There is no rebound or guarding.  There are no masses or hepatosplenomegaly and peristalsis is hypoactive. Extremities have no cyanosis or edema, full range of motion is present. Skin is warm and dry without rash. Neurologic: Mental status is normal, cranial nerves are intact, there are no motor or sensory deficits.  ED Treatments / Results  Labs (all labs ordered are listed, but only abnormal results are displayed) Labs Reviewed - No data to display  Procedures Procedures  Medications Ordered in ED  Medications  sodium chloride 0.9 % bolus 1,000 mL (has no administration in time range)  HYDROmorphone (DILAUDID) injection 1 mg (has no administration in time range)  ondansetron (ZOFRAN) injection 4 mg (has no administration in time range)     Initial Impression / Assessment and Plan / ED Course  I have reviewed the triage vital signs and the nursing notes.  Pertinent lab results that were available during my care of the patient were reviewed by me and considered in my medical decision making (see chart for details).  Abdominal pain in patient with history of pancreatic cancer and status post Whipple procedure.  Old records are reviewed confirming diagnosis of pancreatic cancer.  She apparently did have tumor at the surgical margins and is being referred for radiation treatment.  She also had an ED visit several months ago where she was diagnosed with colitis and sent home with prescription for amoxicillin.  Given recent antibiotic use, she would be at risk for C. difficile, so will send stool sample to test for that.  Will check screening labs and she will be given IV fluids, hydromorphone, ondansetron.  Labs show anemia which is stable compared with baseline.  Metabolic panel was unremarkable.  Case is signed out to Dr. Jeanell Sparrow.  Final Clinical Impressions(s) / ED Diagnoses   Final diagnoses:  Epigastric pain  Microcytic anemia    ED Discharge Orders    None       Delora Fuel, MD 28/41/32 [phone]

## 2018-09-22 NOTE — ED Triage Notes (Signed)
Pt in POV reporting severe abd pain X1 day accompanied by diarrhea. Pt has hx of Pancreatic Cancer dx in October 2019, states she normally has abd pain but this is more severe than normal. Last PO Morphine 0100. Pain 8/10, sharp in nature, tender with palpation.

## 2018-09-22 NOTE — Discharge Instructions (Addendum)
Please take your home pain medicine as prescribed Follow up results of your c. Dificile test on my chart Call your doctor Tuesday for follow up Return if you are worse at any time

## 2018-09-23 ENCOUNTER — Encounter (HOSPITAL_COMMUNITY): Payer: Self-pay

## 2018-09-23 ENCOUNTER — Emergency Department (HOSPITAL_COMMUNITY)
Admission: EM | Admit: 2018-09-23 | Discharge: 2018-09-23 | Disposition: A | Discharge status: Home / Self Care | Payer: Medicaid Other | Attending: Emergency Medicine | Admitting: Emergency Medicine

## 2018-09-23 ENCOUNTER — Other Ambulatory Visit: Payer: Self-pay

## 2018-09-23 ENCOUNTER — Emergency Department (HOSPITAL_COMMUNITY)
Admission: EM | Admit: 2018-09-23 | Discharge: 2018-09-23 | Disposition: A | Discharge status: Home / Self Care | Payer: Medicaid Other | Source: Home / Self Care | Attending: Emergency Medicine | Admitting: Emergency Medicine

## 2018-09-23 DIAGNOSIS — R1013 Epigastric pain: Secondary | ICD-10-CM | POA: Insufficient documentation

## 2018-09-23 DIAGNOSIS — I10 Essential (primary) hypertension: Secondary | ICD-10-CM | POA: Insufficient documentation

## 2018-09-23 DIAGNOSIS — Z8507 Personal history of malignant neoplasm of pancreas: Secondary | ICD-10-CM | POA: Diagnosis not present

## 2018-09-23 DIAGNOSIS — Z91040 Latex allergy status: Secondary | ICD-10-CM | POA: Insufficient documentation

## 2018-09-23 DIAGNOSIS — Z79899 Other long term (current) drug therapy: Secondary | ICD-10-CM | POA: Insufficient documentation

## 2018-09-23 DIAGNOSIS — G8929 Other chronic pain: Secondary | ICD-10-CM | POA: Diagnosis not present

## 2018-09-23 DIAGNOSIS — C259 Malignant neoplasm of pancreas, unspecified: Secondary | ICD-10-CM | POA: Insufficient documentation

## 2018-09-23 DIAGNOSIS — R197 Diarrhea, unspecified: Secondary | ICD-10-CM | POA: Insufficient documentation

## 2018-09-23 DIAGNOSIS — R112 Nausea with vomiting, unspecified: Secondary | ICD-10-CM | POA: Insufficient documentation

## 2018-09-23 DIAGNOSIS — Z87891 Personal history of nicotine dependence: Secondary | ICD-10-CM | POA: Insufficient documentation

## 2018-09-23 LAB — CBC
HCT: 32.1 % — ABNORMAL LOW (ref 36.0–46.0)
HCT: 33.6 % — ABNORMAL LOW (ref 36.0–46.0)
Hemoglobin: 10.3 g/dL — ABNORMAL LOW (ref 12.0–15.0)
Hemoglobin: 11.1 g/dL — ABNORMAL LOW (ref 12.0–15.0)
MCH: 20.4 pg — ABNORMAL LOW (ref 26.0–34.0)
MCH: 20.6 pg — ABNORMAL LOW (ref 26.0–34.0)
MCHC: 32.1 g/dL (ref 30.0–36.0)
MCHC: 33 g/dL (ref 30.0–36.0)
MCV: 63.4 fL — ABNORMAL LOW (ref 80.0–100.0)
MCV: 62.3 fL — ABNORMAL LOW (ref 80.0–100.0)
Platelets: 232 10*3/uL (ref 150–400)
Platelets: 261 10*3/uL (ref 150–400)
RBC: 5.06 MIL/uL (ref 3.87–5.11)
RBC: 5.39 MIL/uL — ABNORMAL HIGH (ref 3.87–5.11)
RDW: 18.7 % — ABNORMAL HIGH (ref 11.5–15.5)
RDW: 18.9 % — ABNORMAL HIGH (ref 11.5–15.5)
WBC: 6.1 10*3/uL (ref 4.0–10.5)
WBC: 7.2 10*3/uL (ref 4.0–10.5)
nRBC: 0 % (ref 0.0–0.2)
nRBC: 0 % (ref 0.0–0.2)

## 2018-09-23 LAB — COMPREHENSIVE METABOLIC PANEL
ALT: 32 U/L (ref 0–44)
ALT: 34 U/L (ref 0–44)
AST: 36 U/L (ref 15–41)
AST: 39 U/L (ref 15–41)
Albumin: 2.9 g/dL — ABNORMAL LOW (ref 3.5–5.0)
Albumin: 3 g/dL — ABNORMAL LOW (ref 3.5–5.0)
Alkaline Phosphatase: 113 U/L (ref 38–126)
Alkaline Phosphatase: 116 U/L (ref 38–126)
Anion gap: 9 (ref 5–15)
Anion gap: 12 (ref 5–15)
BUN: 9 mg/dL (ref 6–20)
BUN: 8 mg/dL (ref 6–20)
CO2: 24 mmol/L (ref 22–32)
CO2: 21 mmol/L — ABNORMAL LOW (ref 22–32)
Calcium: 8.3 mg/dL — ABNORMAL LOW (ref 8.9–10.3)
Calcium: 8.3 mg/dL — ABNORMAL LOW (ref 8.9–10.3)
Chloride: 105 mmol/L (ref 98–111)
Chloride: 105 mmol/L (ref 98–111)
Creatinine, Ser: 0.56 mg/dL (ref 0.44–1.00)
Creatinine, Ser: 0.66 mg/dL (ref 0.44–1.00)
GFR calc Af Amer: >60 mL/min (ref >60)
GFR calc Af Amer: >60 mL/min (ref >60)
GFR calc non Af Amer: >60 mL/min (ref >60)
GFR calc non Af Amer: >60 mL/min (ref >60)
Glucose, Bld: 101 mg/dL — ABNORMAL HIGH (ref 70–99)
Glucose, Bld: 114 mg/dL — ABNORMAL HIGH (ref 70–99)
Potassium: 3.6 mmol/L (ref 3.5–5.1)
Potassium: 3.5 mmol/L (ref 3.5–5.1)
Sodium: 138 mmol/L (ref 135–145)
Sodium: 138 mmol/L (ref 135–145)
Total Bilirubin: 0.5 mg/dL (ref 0.3–1.2)
Total Bilirubin: 0.5 mg/dL (ref 0.3–1.2)
Total Protein: 5.8 g/dL — ABNORMAL LOW (ref 6.5–8.1)
Total Protein: 6.2 g/dL — ABNORMAL LOW (ref 6.5–8.1)

## 2018-09-23 LAB — LIPASE, BLOOD
Lipase: 19 U/L (ref 11–51)
Lipase: 15 U/L (ref 11–51)

## 2018-09-23 LAB — URINALYSIS, ROUTINE W REFLEX MICROSCOPIC
Bilirubin Urine: NEGATIVE
Glucose, UA: NEGATIVE mg/dL
Hgb urine dipstick: NEGATIVE
Ketones, ur: NEGATIVE mg/dL
Leukocytes,Ua: NEGATIVE
Nitrite: NEGATIVE
Protein, ur: NEGATIVE mg/dL
Specific Gravity, Urine: 1.018 (ref 1.005–1.030)
pH: 5 (ref 5.0–8.0)

## 2018-09-23 LAB — I-STAT BETA HCG BLOOD, ED (MC, WL, AP ONLY): I-stat hCG, quantitative: <5 m[IU]/mL (ref <5)

## 2018-09-23 MED ORDER — SODIUM CHLORIDE 0.9% FLUSH
3.0000 mL | Freq: Once | INTRAVENOUS | Status: AC
  Administered 2018-09-23: 3 mL via INTRAVENOUS

## 2018-09-23 MED ORDER — HYDROMORPHONE HCL 1 MG/ML IJ SOLN
1.0000 mg | Freq: Once | INTRAMUSCULAR | Status: AC
  Administered 2018-09-23: 05:00:00 1 mg via INTRAVENOUS
  Filled 2018-09-23: qty 1

## 2018-09-23 MED ORDER — HEPARIN SOD (PORK) LOCK FLUSH 100 UNIT/ML IV SOLN
500.0000 [IU] | Freq: Once | INTRAVENOUS | Status: AC
  Administered 2018-09-23: 500 [IU]
  Filled 2018-09-23: qty 5

## 2018-09-23 MED ORDER — ONDANSETRON HCL 4 MG/2ML IJ SOLN
4.0000 mg | Freq: Once | INTRAMUSCULAR | Status: AC
  Administered 2018-09-23: 03:00:00 4 mg via INTRAVENOUS
  Filled 2018-09-23: qty 2

## 2018-09-23 MED ORDER — SODIUM CHLORIDE 0.9 % IV BOLUS
500.0000 mL | Freq: Once | INTRAVENOUS | Status: AC
  Administered 2018-09-23: 500 mL via INTRAVENOUS

## 2018-09-23 MED ORDER — ONDANSETRON HCL 4 MG/2ML IJ SOLN
4.0000 mg | Freq: Once | INTRAMUSCULAR | Status: AC
  Administered 2018-09-23: 4 mg via INTRAVENOUS
  Filled 2018-09-23: qty 2

## 2018-09-23 MED ORDER — SODIUM CHLORIDE 0.9 % IV SOLN
INTRAVENOUS | Status: DC
  Administered 2018-09-23: 03:00:00 via INTRAVENOUS

## 2018-09-23 MED ORDER — SODIUM CHLORIDE 0.9% FLUSH: 3.0000 mL | Freq: Once | INTRAVENOUS | Status: DC

## 2018-09-23 MED ORDER — MORPHINE SULFATE 15 MG PO TABS
15.0000 mg | ORAL_TABLET | Freq: Once | ORAL | Status: AC
  Administered 2018-09-23: 15 mg via ORAL
  Filled 2018-09-23: qty 1

## 2018-09-23 MED ORDER — HYDROMORPHONE HCL 1 MG/ML IJ SOLN
2.0000 mg | Freq: Once | INTRAMUSCULAR | Status: AC
  Administered 2018-09-23: 2 mg via INTRAVENOUS
  Filled 2018-09-23: qty 2

## 2018-09-23 MED ORDER — HYDROMORPHONE HCL 1 MG/ML IJ SOLN
1.0000 mg | Freq: Once | INTRAMUSCULAR | Status: AC
  Administered 2018-09-23: 1 mg via INTRAVENOUS
  Filled 2018-09-23: qty 1

## 2018-09-23 NOTE — ED Notes (Addendum)
Provided patient with water and instructed to notify RN if feeling nauseous or vomits. Patient ambulated to restroom with no assistance to provide urine and stool sample. Will continue to monitor patient.

## 2018-09-23 NOTE — Discharge Instructions (Addendum)
You were seen in the emergency department for abdominal pain nausea and vomiting and unable to hold down your pain medicine.  You had lab work that was not significantly changed from prior.  He received some IV fluids and some IV pain and nausea medication with improvement in your symptoms.  Please contact your oncologist Dr. Burr Medico to review your medications and see if you need any adjustments.  Return if any concerns.

## 2018-09-23 NOTE — Discharge Instructions (Signed)
Please follow-up with your oncologist for further recommendations for pain management.

## 2018-09-23 NOTE — ED Triage Notes (Signed)
Pt here for evaluation of central abdominal pain with n/v/d x 2 days. Hx pancreatic cancer and Whipple in April. Multiple ER visits within the week for the same.

## 2018-09-23 NOTE — ED Triage Notes (Signed)
Pt reports vomiting and abdominal pain that started yesterday. Actively vomiting in triage. States that she received medication at [facility] yesterday, but her symptoms have returned.

## 2018-09-23 NOTE — ED Provider Notes (Signed)
TIME SEEN: 3:14 AM  CHIEF COMPLAINT: Chronic abdominal pain, nausea, vomiting, diarrhea  HPI: Patient is a 48 year old female with history of pancreatic cancer status post chemo November 2019 until February 2020, Whipple procedure in April 2020 who presents to the emergency department with chronic upper abdominal pain since her Whipple procedure.  She states it is normal for her to have nausea and vomiting but now she is having significant diarrhea.  No blood in her stool or melena.  She has had colitis previously but states this feels different because normally with her colitis she only has vomiting.  Seen in the emergency department at [facility] last night for the same.  C. difficile at that time was negative.  Is taking morphine at home for pain control.  Was given prescription for 3 tablets of Dilaudid on discharge yesterday morning.  States pain is uncontrolled.  ROS: See HPI Constitutional: no fever  Eyes: no drainage  ENT: no runny nose   Cardiovascular:  no chest pain  Resp: no SOB  GI: Diarrhea and vomiting GU: no dysuria Integumentary: no rash  Allergy: no hives  Musculoskeletal: no leg swelling  Neurological: no slurred speech ROS otherwise negative  PAST MEDICAL HISTORY/PAST SURGICAL HISTORY:  Past Medical History:  Diagnosis Date  . Family history of lung cancer   . Family history of prostate cancer   . Family history of uterine cancer   . Gallstones 10/2017  . Pancreatic cancer ([place])   . Pneumothorax, closed, traumatic    years ago  . PONV (postoperative nausea and vomiting)     MEDICATIONS:  Prior to Admission medications   Medication Sig Start Date End Date Taking? Authorizing Provider  acetaminophen (TYLENOL) 325 MG tablet Take 1-2 tablets (325-650 mg total) by mouth every 4 (four) hours as needed for mild pain. 05/21/18   Love, Ivan Anchors, PA-C  diphenoxylate-atropine (LOMOTIL) 2.5-0.025 MG tablet Take 1 to 2 tablets PO QID prn diarrhea Patient not taking:  Reported on 09/22/2018 08/22/18   Harle Stanford., PA-C  dronabinol (MARINOL) 2.5 MG capsule Take 1 capsule (2.5 mg total) by mouth 2 (two) times daily before lunch and supper. 09/13/18   Truitt Merle, MD  gabapentin (NEURONTIN) 300 MG capsule Take 1 capsule (300 mg total) by mouth 3 (three) times daily. 09/02/18   Truitt Merle, MD  HYDROmorphone (DILAUDID) 2 MG tablet Take 1 tablet (2 mg total) by mouth every 4 (four) hours as needed for severe pain. 09/22/18   Pattricia Boss, MD  lipase/protease/amylase (CREON) 36000 UNITS CPEP capsule Take 36,000 Units by mouth 3 (three) times daily before meals.    [provider]  metoCLOPramide (REGLAN) 5 MG tablet Take 1 tablet (5 mg total) by mouth every 6 (six) hours as needed for nausea or vomiting. Patient not taking: Reported on 09/22/2018 07/23/18   Alla Feeling, NP  morphine (MS CONTIN) 15 MG 12 hr tablet Take 1 tablet (15 mg total) by mouth daily. Plan to wean off in the next month 09/02/18   Truitt Merle, MD  morphine (MSIR) 15 MG tablet Take 1 tablet (15 mg total) by mouth every 6 (six) hours as needed for severe pain. 09/10/18   Truitt Merle, MD  ondansetron (ZOFRAN) 8 MG tablet Take 1 tablet (8 mg total) by mouth every 8 (eight) hours as needed for nausea or vomiting. Patient not taking: Reported on 09/22/2018 07/06/18   Deno Etienne, DO  pantoprazole (PROTONIX) 40 MG tablet Take 1 tablet (40 mg total)  by mouth daily. 08/20/18   Truitt Merle, MD  potassium chloride SA (K-DUR) 20 MEQ tablet Take 1 tablet (20 mEq total) by mouth daily. 07/23/18   Alla Feeling, NP  promethazine (PHENERGAN) 25 MG tablet Take 1 tablet (25 mg total) by mouth every 6 (six) hours as needed for nausea or vomiting. Patient not taking: Reported on 09/22/2018 06/19/18   Stark Klein, MD    ALLERGIES:  Allergies  Allergen Reactions  . Latex Hives    SOCIAL HISTORY:  Social History   Tobacco Use  . Smoking status: Former Smoker    Packs/day: 1.00    Years: 37.00    Pack years: 37.00     Types: Cigarettes    Quit date: 04/25/2018    Years since quitting: 0.4  . Smokeless tobacco: Never Used  Substance Use Topics  . Alcohol use: Not Currently    FAMILY HISTORY: Family History  Problem Relation Age of Onset  . Diabetes Mother   . Hypertension Mother   . COPD Mother   . Uterine cancer Mother 10       had hysterectomy  . Diabetes Sister   . Hypertension Sister   . Lung cancer Maternal Grandmother        lung cancer  . Hypertension Sister     EXAM: BP (!) 179/92 (BP Location: Right Arm)   Pulse 79   Temp 98.7 F (37.1 C) (Oral)   Resp 16   Ht 4' 10"  (1.473 m)   Wt 43.1 kg   SpO2 100%   BMI 19.86 kg/m  CONSTITUTIONAL: Alert and oriented and responds appropriately to questions. Well-appearing; well-nourished HEAD: Normocephalic EYES: Conjunctivae clear, pupils appear equal, EOMI ENT: normal nose; moist mucous membranes NECK: Supple, no meningismus, no nuchal rigidity, no LAD  CARD: RRR; S1 and S2 appreciated; no murmurs, no clicks, no rubs, no gallops RESP: Normal chest excursion without splinting or tachypnea; breath sounds clear and equal bilaterally; no wheezes, no rhonchi, no rales, no hypoxia or respiratory distress, speaking full sentences ABD/GI: Normal bowel sounds; non-distended; soft, tender in the epigastric region, no rebound, no guarding, no peritoneal signs, no hepatosplenomegaly BACK:  The back appears normal and is non-tender to palpation, there is no CVA tenderness EXT: Normal ROM in all joints; non-tender to palpation; no edema; normal capillary refill; no cyanosis, no calf tenderness or swelling    SKIN: Normal color for age and race; warm; no rash NEURO: Moves all extremities equally PSYCH: The patient's mood and manner are appropriate. Grooming and personal hygiene are appropriate.  MEDICAL DECISION MAKING: Patient here with chronic abdominal pain and vomiting.  Also reports increasing diarrhea.  Had negative C. difficile testing yesterday.   Will repeat labs, urine today.  I do not feel she needs repeat CT imaging given this pain has been present since April.  Last CT scan was August 5 which showed colitis which she has had frequently.  She agrees a plan to defer CT at this time.  Will give IV fluids, Dilaudid, Zofran for symptomatic relief.  ED PROGRESS: Patient's labs are reassuring.  She now states it has been over 24 hours since her last episode of diarrhea and she cannot provide [place] with a stool culture.  She states she has had a normal solid bowel movement at home.  Will cancel stool studies.  Urinalysis pending.  Requesting further pain medication but states she is feeling better and tolerating p.o. currently.  I suspect that this is an acute  exacerbation of her chronic pain from her pancreatic cancer.  She feels like the morphine at home is not controlling her pain symptoms.  She is requested to the nursing staff that we give her a "double dose" of Dilaudid here in the emergency department.  I do not feel this is appropriate given she has had some significant drop in her blood pressure after 21m IV Dilaudid.   5:20 AM  Pt's urine shows no sign of infection or dehydration.  Pain continues to be improved after another dose of IV Dilaudid.  I do not feel further narcotics are indicated as her blood pressure is now 101/67.  She does not appear to be in distress.  She is requesting that I discharge her home with a prescription of Dilaudid.  Discussed with patient that unfortunately I do not feel comfortable with this as this is not a medication I prescribed often and we discussed that given she is receiving chronic narcotics from her oncologist, she should discuss this with her oncologist for adjustments in her pain medication regimen.  I am concerned that [place].  It appears she has recently had multiple different narcotic prescriptions filled (8/6, 8/17, 8/25).  [place]  [place].  She verbalized understanding and is comfortable with following up with her oncologist for further pain management.  At this time, I do not feel there is any life-threatening condition present. I have reviewed and discussed all results (EKG, imaging, lab, urine as appropriate) and exam findings with patient/family. I have reviewed nursing notes and appropriate previous records.  I feel the patient is safe to be discharged home without further emergent workup and can continue workup as an outpatient as needed. Discussed usual and customary return precautions. Patient/family verbalize understanding and are comfortable with this plan.  Outpatient follow-up has been provided as needed. All questions have been answered.    09/13/2018  3   09/13/2018  Dronabinol 2.5 MG Capsule  30.00 15 Ya Fen   [id number]  Wes ([id number])   0   Medicaid   [place]  09/10/2018  3   09/10/2018  Morphine Sulfate Ir 15 MG Tab  60.00 15 Ya Fen   [id number]   Wes (0126)   0  60.00 MME  Medicaid   [place]  09/02/2018  3   09/02/2018  Morphine Sulf Er 15 MG Tablet  20.00 20 Ya Fen   [id number]  Wes (0126)   0  15.00 MME  Medicaid   [place]  08/22/2018  3   08/22/2018  Morphine Sulfate Ir 15 MG Tab  90.00 15 Ya Fen   [id number]   Wes (0126)   0  90.00 MME  Medicaid   [place]  08/08/2018  3   08/08/2018  Morphine Sulfate Ir 15 MG Tab  90.00 23 Ya Fen   [id number]  Wes (0126)   0  58.70 MME  Medicaid   [place]  08/02/2018  3   08/02/2018  Morphine Sulf Er 15 MG Tablet  60.00 30 Va Tan   [id number]  Wes (0126)   0  30.00 MME  Medicaid   [place]  07/23/2018  3   07/23/2018  Morphine Sulfate Ir 15 MG Tab  60.00 10 La Bur   [id number]  Wes (0126)   0  90.00 MME  Medicaid   [place]  07/08/2018  3   07/08/2018  Morphine Sulf Er 15 MG Tablet  60.00 30 Ya Fen   [id number]   Wes (0126)   0  30.00 MME  Medicaid   [place]  07/08/2018  3   07/08/2018  Morphine Sulfate Ir 15 MG Tab  60.00 10 Ya Fen   [id number]   Wes (0126)   0  90.00 MME  Medicaid   [place]  07/07/2018  5    07/07/2018  Oxycodone-Acetaminophen 5-325  6.00 2 Bo Tra   [id number]   [place] (5974)   0  22.50 MME  Medicaid   [place]  06/25/2018  4   06/25/2018  Morphine Sulfate Ir 15 MG Tab  60.00 15 Fa Bye   [id number]   Wal (2001)   0  60.00 MME  Medicaid   [place]  06/07/2018  4   06/07/2018  Morphine Sulfate Ir 15 MG Tab  60.00 15 Fa Bye   [id number]   Wal (2001)   0  60.00 MME  Medicaid   [place]  05/17/2018  4   05/16/2018  Morphine Sulfate Ir 15 MG Tab  120.00 20 Fa Bye   [id number]   Wal (2001)   0  90.00 MME  Medicaid   [place]  04/19/2018  3   04/15/2018  Morphine Sulfate Ir 15 MG Tab  120.00 30 Ya Fen   [id number]   Wes (0126)   0  60.00 MME  Medicaid   [place]  04/15/2018  3   04/15/2018  Morphine Sulf Er 15 MG Tablet  60.00 30 Ya Fen   [id number]   Wes (0126)   0  30.00 MME  Private Pay   [place]  03/20/2018  3   03/20/2018  Morphine Sulfate Ir 15 MG Tab  120.00 30 Ya Fen   [id number]   Wes (0126)   0  60.00 MME  Private Pay   [place]  03/18/2018  3   03/06/2018  Morphine Sulf Er 15 MG Tablet  60.00 30 Ya Fen   [id number]   Wes (0126)   0  30.00 MME  Private Pay   [place]  02/20/2018  3   02/20/2018  Morphine Sulfate Ir 15 MG Tab  120.00 30 Ya Fen   [id number]   Wes (0126)   0  60.00 MME  Private Pay   [place]  02/15/2018  3   02/15/2018  Morphine Sulf Er 15 MG Tablet  60.00 30 Ya Fen   [id number]   Wes (0126)   0  30.00 MME  Private Pay   [place]  02/05/2018  3   02/05/2018  Morphine Sulfate Ir 15 MG Tab  60.00 15 Va Tan   [id number]   Wes (0126)   0  60.00 MME  Private Pay   [place]  02/02/2018  2   02/02/2018  Hydromorphone 4 MG Tablet  12.00 3 El Wen   [id number]   [place] (5974)   0  64.00 MME  Private Pay   [place]  01/14/2018  1   01/14/2018  Morphine Sulf Er 15 MG Tablet  60.00 30 Li Tho   [id number]   Wes (0126)   0  30.00 MME  Private Pay   [place]  01/10/2018  1   01/02/2018  Morphine Sulfate Ir 15 MG Tab  60.00 30 Ya Fen   [id number]   Wes (0126)   0  30.00 MME  Private Pay     01/02/2018  1   01/02/2018  Morphine Sulf Er 15  MG Tablet  30.00 15 Ya Fen   [id number]   Wes (0126)   0  30.00 MME  Private Pay    [place]  12/18/2017  1   12/18/2017  Morphine Sulf Er 15 MG Tablet  30.00 15 La Bur   [id number]   Wes (0126)   0  30.00 MME  Private Pay   [place]  12/12/2017  1   12/11/2017  Morphine Sulfate Ir 15 MG Tab  60.00 30 Ya Fen   [id number]   Wes (0126)   0  30.00 MME  Other   [place]  12/12/2017  1   12/12/2017  Hydromorphone 4 MG Tablet  20.00 5 Otho Ket   [id number]   Wes (0126)   0  64.00 MME  Private Pay   [place]  12/04/2017  1   12/04/2017  Morphine Sulfate Ir 15 MG Tab  20.00 10 La Bur   [id number]   Wes (0126)   0  30.00 MME  Private Pay   [place]  11/26/2017  1   11/26/2017  Morphine Sulfate Ir 15 MG Tab  20.00 10 Ya Fen   [id number]   Wes ([id number])   0  30.00 MME  Private Pay     11/15/2017  1   11/09/2017  Morphine Sulfate Ir 15 MG Tab  60.00 30 Ya Fen   [id number]   Wes ([id number])   0  30.00 MME  Private Pay          Johnaton Sonneborn, Delice Bison, DO 09/23/18 [phone]

## 2018-09-23 NOTE — ED Provider Notes (Signed)
[facility] EMERGENCY DEPARTMENT Provider Note   CSN: [id number] Arrival date & time: 09/23/18  1748     History   Chief Complaint Chief Complaint  Patient presents with   Abdominal Pain    HPI Jane Brooks is a 48 y.o. female.  She has a history of pancreatic cancer and she is status post a Whipple procedure this year.  She follows with oncology here.  She has had chronic pain is is on oral morphine and nausea medication.  She says her pain is been worse over the past week and she has been in the ER twice most recently earlier this morning.  She has not followed up with her oncologist yet and she says she needs to call them tomorrow.  She is complaining of severe upper abdominal pain typical of her disease along with nausea and vomiting and cannot hold down her oral pain medication.  Has had some diarrhea but she says that slowing down on that diarrhea medication although she cannot hold that down now either.  Denies any fevers or chills.     The history is provided by the patient.  Abdominal Pain Pain location:  Epigastric Pain quality: stabbing   Pain severity:  Severe Onset quality:  Gradual Timing:  Constant Progression:  Unchanged Chronicity:  Recurrent Context: not suspicious food intake and not trauma   Relieved by:  Nothing Worsened by:  Nothing Ineffective treatments:  None tried Associated symptoms: diarrhea, nausea and vomiting   Associated symptoms: no chest pain, no cough, no dysuria, no fever, no hematemesis, no shortness of breath and no sore throat     Past Medical History:  Diagnosis Date   Family history of lung cancer    Family history of prostate cancer    Family history of uterine cancer    Gallstones 10/2017   Pancreatic cancer ([place])    Pneumothorax, closed, traumatic    years ago   PONV (postoperative nausea and vomiting)     Patient Active Problem List   Diagnosis Date Noted   Hospital discharge follow-up  06/29/2018   Nausea & vomiting 06/29/2018   Colitis 06/15/2018   Hypokalemia 05/22/2018   Peripheral edema    Prediabetes    Acute blood loss anemia    Thrombocytopenia (HCC)    Urinary retention    Sinus tachycardia    Debility 05/16/2018   Acute respiratory failure with hypoxemia ([place]) 04/30/2018   HCAP (healthcare-associated pneumonia) 04/30/2018   Essential hypertension 04/30/2018   Chronic prescription opiate use 04/30/2018   Postoperative pain 04/30/2018   Tachycardia 04/30/2018   Tachypnea 04/30/2018   Acute respiratory alkalosis 04/30/2018   Adenocarcinoma of head of pancreas ([place]) 04/25/2018   Genetic testing 01/24/2018   Family history of uterine cancer    Family history of prostate cancer    Family history of lung cancer    Pancreatic cancer ([place]) 11/09/2017   Pancreatic mass    Cholecystitis 10/30/2017   Jaundice 10/30/2017    Past Surgical History:  Procedure Laterality Date   BILIARY STENT PLACEMENT  10/31/2017   Procedure: BILIARY STENT PLACEMENT;  Surgeon: [name], MD;  Location: [facility] ENDOSCOPY;  Service: Gastroenterology;;   ERCP N/A 10/31/2017   Procedure: ENDOSCOPIC RETROGRADE CHOLANGIOPANCREATOGRAPHY (ERCP);  Surgeon: [name], MD;  Location: [facility] ENDOSCOPY;  Service: Gastroenterology;  Laterality: N/A;   ESOPHAGOGASTRODUODENOSCOPY N/A 11/08/2017   Procedure: ESOPHAGOGASTRODUODENOSCOPY (EGD);  Surgeon: Milus Banister, MD;  Location: [facility] ENDOSCOPY;  Service: Endoscopy;  Laterality: N/A;  EUS N/A 11/08/2017   Procedure: UPPER ENDOSCOPIC ULTRASOUND (EUS) RADIAL;  Surgeon: Milus Banister, MD;  Location: [facility] ENDOSCOPY;  Service: Endoscopy;  Laterality: N/A;   FINE NEEDLE ASPIRATION  11/08/2017   Procedure: FINE NEEDLE ASPIRATION;  Surgeon: Milus Banister, MD;  Location: [facility] ENDOSCOPY;  Service: Endoscopy;;   IR IMAGING GUIDED PORT INSERTION  11/15/2017   LAPAROSCOPY N/A 04/25/2018   Procedure: LAPAROSCOPY  DIAGNOSTIC;  Surgeon: Stark Klein, MD;  Location: [place];  Service: General;  Laterality: N/A;  GENERAL AND EPIDURAL ANESTHESIA   SPHINCTEROTOMY  10/31/2017   Procedure: SPHINCTEROTOMY;  Surgeon: [name], MD;  Location: [place];  Service: Gastroenterology;;   TONSILLECTOMY     WHIPPLE PROCEDURE N/A 04/25/2018   Procedure: WHIPPLE PROCEDURE, RECONSTRUCTION OF PORTAL VEIN;  Surgeon: Stark Klein, MD;  Location: [place];  Service: General;  Laterality: N/A;     OB History   No obstetric history on file.      Home Medications    Prior to Admission medications   Medication Sig Start Date End Date Taking? Authorizing Provider  acetaminophen (TYLENOL) 325 MG tablet Take 1-2 tablets (325-650 mg total) by mouth every 4 (four) hours as needed for mild pain. 05/21/18   Love, Ivan Anchors, PA-C  diphenoxylate-atropine (LOMOTIL) 2.5-0.025 MG tablet Take 1 to 2 tablets PO QID prn diarrhea Patient not taking: Reported on 09/22/2018 08/22/18   Harle Stanford., PA-C  dronabinol (MARINOL) 2.5 MG capsule Take 1 capsule (2.5 mg total) by mouth 2 (two) times daily before lunch and supper. 09/13/18   Truitt Merle, MD  gabapentin (NEURONTIN) 300 MG capsule Take 1 capsule (300 mg total) by mouth 3 (three) times daily. 09/02/18   Truitt Merle, MD  HYDROmorphone (DILAUDID) 2 MG tablet Take 1 tablet (2 mg total) by mouth every 4 (four) hours as needed for severe pain. 09/22/18   Pattricia Boss, MD  lipase/protease/amylase (CREON) 36000 UNITS CPEP capsule Take 36,000 Units by mouth 3 (three) times daily before meals.    [provider]  metoCLOPramide (REGLAN) 5 MG tablet Take 1 tablet (5 mg total) by mouth every 6 (six) hours as needed for nausea or vomiting. Patient not taking: Reported on 09/22/2018 07/23/18   Alla Feeling, NP  morphine (MS CONTIN) 15 MG 12 hr tablet Take 1 tablet (15 mg total) by mouth daily. Plan to wean off in the next month 09/02/18   Truitt Merle, MD  morphine (MSIR) 15 MG tablet Take 1 tablet (15  mg total) by mouth every 6 (six) hours as needed for severe pain. 09/10/18   Truitt Merle, MD  ondansetron (ZOFRAN) 8 MG tablet Take 1 tablet (8 mg total) by mouth every 8 (eight) hours as needed for nausea or vomiting. Patient not taking: Reported on 09/22/2018 07/06/18   Deno Etienne, DO  pantoprazole (PROTONIX) 40 MG tablet Take 1 tablet (40 mg total) by mouth daily. 08/20/18   Truitt Merle, MD  potassium chloride SA (K-DUR) 20 MEQ tablet Take 1 tablet (20 mEq total) by mouth daily. 07/23/18   Alla Feeling, NP  promethazine (PHENERGAN) 25 MG tablet Take 1 tablet (25 mg total) by mouth every 6 (six) hours as needed for nausea or vomiting. Patient not taking: Reported on 09/22/2018 06/19/18   Stark Klein, MD    Family History Family History  Problem Relation Age of Onset   Diabetes Mother    Hypertension Mother    COPD Mother    Uterine cancer Mother 73  had hysterectomy   Diabetes Sister    Hypertension Sister    Lung cancer Maternal Grandmother        lung cancer   Hypertension Sister     Social History Social History   Tobacco Use   Smoking status: Former Smoker    Packs/day: 1.00    Years: 37.00    Pack years: 37.00    Types: Cigarettes    Quit date: 04/25/2018    Years since quitting: 0.4   Smokeless tobacco: Never Used  Substance Use Topics   Alcohol use: Not Currently   Drug use: Never     Allergies   Latex   Review of Systems Review of Systems  Constitutional: Negative for fever.  HENT: Negative for sore throat.   Eyes: Negative for visual disturbance.  Respiratory: Negative for cough and shortness of breath.   Cardiovascular: Negative for chest pain.  Gastrointestinal: Positive for abdominal pain, diarrhea, nausea and vomiting. Negative for hematemesis.  Genitourinary: Negative for dysuria.  Musculoskeletal: Negative for neck pain.  Skin: Negative for rash.  Neurological: Negative for headaches.     Physical Exam Updated Vital Signs BP (!)  148/108 (BP Location: Right Arm)    Pulse 89    Temp 98.3 F (36.8 C) (Oral)    Resp 16    SpO2 99%   Physical Exam Vitals signs and nursing note reviewed.  Constitutional:      General: She is not in acute distress.    Appearance: She is well-developed and underweight.  HENT:     Head: Normocephalic and atraumatic.  Eyes:     Conjunctiva/sclera: Conjunctivae normal.  Neck:     Musculoskeletal: Neck supple.  Cardiovascular:     Rate and Rhythm: Normal rate and regular rhythm.     Heart sounds: No murmur.  Pulmonary:     Effort: Pulmonary effort is normal. No respiratory distress.     Breath sounds: Normal breath sounds.  Abdominal:     Palpations: Abdomen is soft.     Tenderness: There is abdominal tenderness in the epigastric area.  Musculoskeletal: Normal range of motion.     Right lower leg: No edema.     Left lower leg: No edema.  Skin:    General: Skin is warm and dry.     Capillary Refill: Capillary refill takes less than 2 seconds.  Neurological:     General: No focal deficit present.     Mental Status: She is alert and oriented to person, place, and time.      ED Treatments / Results  Labs (all labs ordered are listed, but only abnormal results are displayed) Labs Reviewed  COMPREHENSIVE METABOLIC PANEL - Abnormal; Notable for the following components:      Result Value   CO2 21 (*)    Glucose, Bld 114 (*)    Calcium 8.3 (*)    Total Protein 6.2 (*)    Albumin 3.0 (*)    All other components within normal limits  CBC - Abnormal; Notable for the following components:   RBC 5.39 (*)    Hemoglobin 11.1 (*)    HCT 33.6 (*)    MCV 62.3 (*)    MCH 20.6 (*)    RDW 18.9 (*)    All other components within normal limits  LIPASE, BLOOD  URINALYSIS, ROUTINE W REFLEX MICROSCOPIC    EKG None  Radiology No results found.  Procedures Procedures (including critical care time)  Medications Ordered in ED Medications  sodium chloride flush (NS) 0.9 %  injection 3 mL (has no administration in time range)  sodium chloride 0.9 % bolus 500 mL (0 mLs Intravenous Stopped 09/23/18 2022)  ondansetron (ZOFRAN) injection 4 mg (4 mg Intravenous Given 09/23/18 1922)  HYDROmorphone (DILAUDID) injection 2 mg (2 mg Intravenous Given 09/23/18 1923)  HYDROmorphone (DILAUDID) injection 1 mg (1 mg Intravenous Given 09/23/18 2054)  morphine (MSIR) tablet 15 mg (15 mg Oral Given 09/23/18 2055)  heparin lock flush 100 unit/mL (500 Units Intracatheter Given 09/23/18 2210)     Initial Impression / Assessment and Plan / ED Course  I have reviewed the triage vital signs and the nursing notes.  Pertinent labs & imaging results that were available during my care of the patient were reviewed by me and considered in my medical decision making (see chart for details).  Clinical Course as of Sep 23 2315  Molli Knock Sep 23, 2018  2029 Patient with known history of pancreatic cancer here with intractable abdominal pain despite her oral medication.  She is received an IV dose of pain medicine and seems moderately improved.  Her labs do not show any significant changes from prior.  Will order another IV dose and her oral dose of pain medicine and see if we can get her comfortable enough to go home.   [MB]  2132 Reevaluated patient.  She said she was able to hold down her oral pain medication and is comfortable with going home.  She understands she needs to contact her oncologist to review her medications and see if she needs any adjustments.  Blood pressure is lower here after medication although this seems to be in the range that she has been in other ED visits.   [MB]    Clinical Course User Index [MB] Hayden Rasmussen, MD        Final Clinical Impressions(s) / ED Diagnoses   Final diagnoses:  Epigastric pain  Malignant neoplasm of pancreas, unspecified location of malignancy [facility])    ED Discharge Orders    None       Hayden Rasmussen, MD 09/23/18 2317

## 2018-09-23 NOTE — ED Notes (Signed)
Patient made aware that urine sample is needed.

## 2018-09-24 ENCOUNTER — Other Ambulatory Visit: Payer: Self-pay | Admitting: Hematology

## 2018-09-24 ENCOUNTER — Telehealth: Payer: Self-pay

## 2018-09-24 MED ORDER — DRONABINOL 2.5 MG PO CAPS: 2.5000 mg | ORAL_CAPSULE | Freq: Two times a day (BID) | ORAL | 0 refills | Status: DC

## 2018-09-24 MED ORDER — MORPHINE SULFATE 15 MG PO TABS: 15.0000 mg | ORAL_TABLET | Freq: Four times a day (QID) | ORAL | 0 refills | Status: DC | PRN

## 2018-09-24 MED ORDER — MORPHINE SULFATE ER 15 MG PO TBCR: 15.0000 mg | EXTENDED_RELEASE_TABLET | Freq: Every day | ORAL | 0 refills | Status: DC

## 2018-09-24 MED FILL — MORPHINE SULF ER 15 MG TAB: 15 | 60 days supply | Qty: 60 | Fill #0

## 2018-09-24 MED FILL — MORPHINE SULFATE IR 15 MG T: 15 | 15 days supply | Qty: 60 | Fill #0

## 2018-09-24 NOTE — Telephone Encounter (Signed)
I refilled for a month, except MSIR for 2 weeks, let her know not to use MSIR more than 4 tabs a day. I will not refill earlier, without seeing her back for reevaluation.   Truitt Merle MD

## 2018-09-24 NOTE — Telephone Encounter (Signed)
Patient calls for the following refills:  MS Contin MSIR Marinol  She would like them sent into [facility] pharmacy.

## 2018-09-24 NOTE — Telephone Encounter (Signed)
Spoke with patient regarding the refills that she requested.  Informed her Dr. Burr Medico sent in one months supply on MS Contin and Marinol.  A two weeks supply of MSIR, instructed her to not exceed 4 tablets per day and she will not refill earlier without seeing her back for evaluation.  The patient verbalized an understanding of the instructions given.

## 2018-09-25 NOTE — Progress Notes (Signed)
GI Location of Tumor / Histology: Malignant neoplasm of other parts of pancreas  Staging form: Exocrine Pancreas, AJCC 8th Edition - Clinical stage from 11/08/2017: Stage IB (cT2, cN0, cM0) - Signed by Truitt Merle, Jane Brooks on 11/09/2017  Jane Brooks presented   CT abd/Pelvis 08/21/2018: No discrete recurrent mass is identified in the pancreas.    PET 07/25/2018: Right hepatic lobe hypermetabolism, in the setting of heterogeneous hepatic steatosis.  Finding is highly suspicious for isolated hepatic metastasis.There is also mild uptake of LN alongmedial hepatic capsule and within the hepatic dome, no other evidence of mets.     CT AP 04/19/2018:Essentially stable exam compared to most recent CT scans of 01/2018 and 11/2017.  Stable mass in the head of the pancreas with duct dilatation and atrophy upstream.  Mass continues to surround the GDA and contacts the portal vein but is free of hte celiac trunk and SMA.  CT AP 02/04/2018: No substantial change in size of mass involving head of pancreas.  A aortocaval lymph node which appears increased in the interval measuring 1.1 cm versus 0.8 cm previously.  Stable 1.2 cm peripancreatic node.  CT CAP 11/15/2017: No findings of active malignancy in the chest or pelvis.  EUS/EGD 11/08/2017: irregularly bordered, approximately 2.6 cm mass was found in the head of pancreas.  This causes biliary and pancreatic duct obstruction.  The mass directly abuts and attenuates the PV/SMV confluence suggesting invasion.  MRI Abd 10/30/2017: Marked intra and extrahepatic biliary duct dilatation with abrupt cut off of the common bile duct at the pancreatic head.  This is associated with marked dilatation of the main pancreatic duct also with an abrupt cut off at the level of the pancreatic head.  A subtle 2.5 x 3 cm lesion with differential signal intensity is identified in the head of the pancreas which appears slightly hypoenhancing on postcontrast imaging.  Together, these features  are highly concerning for pancreatic adenocarcinoma.  Biopsies of liver 08/13/2018   Surgical Pathology: Pancreas 04/25/2018   Past/Anticipated interventions by surgeon, if any:  Dr. Barry Dienes 04/25/2018 -Laparoscopy diagnostic and whipple procedure, reconstruction of portal vein.    Past/Anticipated interventions by medical oncology, if any:  Dr. Burr Medico 08/16/2018 -Diagnosed in 10/2017.She is s/p neoadjuvant FOLFIRINOX for 4 months followed by whipple surgery by Dr Barry Dienes on 04/25/18.  Unfortunately her surgical margins (vein) were positive and she had one positive node.  --Her postop recovery has been difficult with abdominal pain, nausea, vomiting and diarrhea, multiple ED visits and hospitalization -We discussed her liver biopsy from 08/13/18 which shows no evidence of cancer cells.  -I discussed there is a possibility the biopsy could have missed the suspicious mass of concern, but IR Dr. Annamaria Boots felt they got it and did not recommend a rebiopsy.  -I recommend following up with another CT scan in the next 1-2 months.  -due to her positive surgical margins, I will refer her to rad/onc    Weight changes, if any: 55 pounds lost since April  Bowel/Bladder complaints, if any: Diarrhea is off and on.  Nausea / Vomiting, if any: Daily, taking medication.  States her bile is now black.  Pain issues, if any:  Pain in her abdomen, relieved with pain medication.       SAFETY ISSUES:  Prior radiation? No  Pacemaker/ICD? No  Possible current pregnancy? No  Is the patient on methotrexate? No  Current Complaints/Details: Chemo- FOLFIRINOX q2 weeks with neulasta injection for 4 months starting 11/19/2017.

## 2018-09-26 ENCOUNTER — Other Ambulatory Visit: Payer: Self-pay

## 2018-09-26 ENCOUNTER — Ambulatory Visit
Admission: RE | Admit: 2018-09-26 | Discharge: 2018-09-26 | Disposition: A | Discharge status: Home / Self Care | Payer: Medicaid Other | Source: Ambulatory Visit | Attending: Radiation Oncology | Admitting: Radiation Oncology

## 2018-09-26 ENCOUNTER — Encounter: Payer: Self-pay | Admitting: Radiation Oncology

## 2018-09-26 VITALS — Ht <= 58 in | Wt 95.0 lb

## 2018-09-26 DIAGNOSIS — C250 Malignant neoplasm of head of pancreas: Secondary | ICD-10-CM

## 2018-09-26 DIAGNOSIS — C257 Malignant neoplasm of other parts of pancreas: Secondary | ICD-10-CM

## 2018-09-26 MED FILL — DRONABINOL 2.5 MG CAPSULE: 2.5 | 30 days supply | Qty: 60 | Fill #0

## 2018-09-26 NOTE — Addendum Note (Signed)
Encounter addended by: Hayden Pedro, PA-C on: 09/26/2018 9:27 AM  Actions taken: Clinical Note Signed

## 2018-09-26 NOTE — Addendum Note (Signed)
Encounter addended by: Kyung Rudd, MD on: 09/26/2018 9:36 AM  Actions taken: Problem List modified, Visit diagnoses modified

## 2018-09-26 NOTE — Progress Notes (Addendum)
Radiation Oncology         [phone] ________________________________  Initial Outpatient Consultation - Conducted via telephone due to current COVID-19 concerns for limiting patient exposure  I spoke with the patient to conduct this consult visit via telephone to spare the patient unnecessary potential exposure in the healthcare setting during the current COVID-19 pandemic. The patient was notified in advance and was offered a [place] meeting to allow for face to face communication but unfortunately reported that they did not have the appropriate resources/technology to support such a visit and instead preferred to proceed with a telephone consult.   ________________________________  Name: Jane Brooks        MRN: [id number]  Date of Service: 09/26/2018 DOB: [birth year]  JS:[id number], No Pcp Per  Truitt Merle, MD     REFERRING PHYSICIAN: Truitt Merle, MD   DIAGNOSIS: The encounter diagnosis was Malignant neoplasm of other parts of pancreas ([place]).   HISTORY OF PRESENT ILLNESS: Jane Brooks is a 48 y.o. female seen at the request of Dr. Burr Medico for a diagnosis of pancreatic cancer.  The patient was originally diagnosed in October 2019 following MRI of the abdomen revealing a 2.5 x 3 cm lesion in the head of the pancreas resulting in extra and intrahepatic biliary duct dilatation, her baseline CA-19-9 was 140.  She underwent ERCP with sphincterotomy and stent placement on 10/31/2017.  EUS with EGD with Dr. Ardis Hughs on 11/08/2017 revealed a 2.6 cm mass within the pancreatic head causing biliary and pancreatic duct this obstruction the mass directly abuts and attenuates the portal venous/SMV confluence and fine-needle aspirate revealed adenocarcinoma.  She received neoadjuvant full ferry [employer] cycles between November 2019 and February 2020.  Regression of disease along the celiac trunk and SMA was noted, and she underwent diagnostic laparoscopy and Whipple procedure with reconstruction of the portal vein  by Dr. Barry Dienes on 04/25/2018.  Final pathology revealed a 3.5 cm invasive adenocarcinoma present at the superior margin of the attached vein in the inferior margin of the attached vein as well as extending into the peripancreatic soft tissue with perineural invasion, and disease within 1 of the 29 sampled lymph nodes.  There was also focal high-grade glandular dysplastic change at the pancreatic margin.  Postoperative restaging scans appeared to be stable, and pet imaging on 07/25/2018 revealed right hepatic lobe hypermetabolism suspicious for isolated hepatic metastatic disease, she did undergo a biopsy of this site which was consistent with liver parenchyma and extravasation of bile.  She was counseled on the need to proceed with adjuvant radiotherapy, she has been contacted on multiple occasions and has rescheduled appointments on several dates but is contacted today by telephone to discuss the rationale for adjuvant radiotherapy.  She also continues to have recurrent abdominal pain managed with morphine under the care of Dr. Burr Medico.    PREVIOUS RADIATION THERAPY: No   PAST MEDICAL HISTORY:  Past Medical History:  Diagnosis Date   Family history of lung cancer    Family history of prostate cancer    Family history of uterine cancer    Gallstones 10/2017   Pancreatic cancer ([place])    Pneumothorax, closed, traumatic    years ago   PONV (postoperative nausea and vomiting)        PAST SURGICAL HISTORY: Past Surgical History:  Procedure Laterality Date   BILIARY STENT PLACEMENT  10/31/2017   Procedure: BILIARY STENT PLACEMENT;  Surgeon: [name], MD;  Location: [place];  Service: Gastroenterology;;   ERCP N/A 10/31/2017  Procedure: ENDOSCOPIC RETROGRADE CHOLANGIOPANCREATOGRAPHY (ERCP);  Surgeon: [name], MD;  Location: [facility] ENDOSCOPY;  Service: Gastroenterology;  Laterality: N/A;   ESOPHAGOGASTRODUODENOSCOPY N/A 11/08/2017   Procedure: ESOPHAGOGASTRODUODENOSCOPY (EGD);   Surgeon: Milus Banister, MD;  Location: [facility] ENDOSCOPY;  Service: Endoscopy;  Laterality: N/A;   EUS N/A 11/08/2017   Procedure: UPPER ENDOSCOPIC ULTRASOUND (EUS) RADIAL;  Surgeon: Milus Banister, MD;  Location: [facility] ENDOSCOPY;  Service: Endoscopy;  Laterality: N/A;   FINE NEEDLE ASPIRATION  11/08/2017   Procedure: FINE NEEDLE ASPIRATION;  Surgeon: Milus Banister, MD;  Location: [facility] ENDOSCOPY;  Service: Endoscopy;;   IR IMAGING GUIDED PORT INSERTION  11/15/2017   LAPAROSCOPY N/A 04/25/2018   Procedure: LAPAROSCOPY DIAGNOSTIC;  Surgeon: Stark Klein, MD;  Location: [place];  Service: General;  Laterality: N/A;  GENERAL AND EPIDURAL ANESTHESIA   SPHINCTEROTOMY  10/31/2017   Procedure: SPHINCTEROTOMY;  Surgeon: [name], MD;  Location: [place];  Service: Gastroenterology;;   TONSILLECTOMY     WHIPPLE PROCEDURE N/A 04/25/2018   Procedure: WHIPPLE PROCEDURE, RECONSTRUCTION OF PORTAL VEIN;  Surgeon: Stark Klein, MD;  Location: [place];  Service: General;  Laterality: N/A;     FAMILY HISTORY:  Family History  Problem Relation Age of Onset   Diabetes Mother    Hypertension Mother    COPD Mother    Uterine cancer Mother 39       had hysterectomy   Diabetes Sister    Hypertension Sister    Lung cancer Maternal Grandmother        lung cancer   Hypertension Sister      SOCIAL HISTORY:  reports that she quit smoking about 5 months ago. Her smoking use included cigarettes. She has a 37.00 pack-year smoking history. She has never used smokeless tobacco. She reports previous alcohol use. She reports that she does not use drugs. The patient is single and lives in [place]. She's originally from [place].   ALLERGIES: Latex   MEDICATIONS:  Current Outpatient Medications  Medication Sig Dispense Refill   acetaminophen (TYLENOL) 325 MG tablet Take 1-2 tablets (325-650 mg total) by mouth every 4 (four) hours as needed for mild pain.     diphenoxylate-atropine  (LOMOTIL) 2.5-0.025 MG tablet Take 1 to 2 tablets PO QID prn diarrhea (Patient not taking: Reported on 09/22/2018) 40 tablet 0   dronabinol (MARINOL) 2.5 MG capsule Take 1 capsule (2.5 mg total) by mouth 2 (two) times daily before lunch and supper. 60 capsule 0   gabapentin (NEURONTIN) 300 MG capsule Take 1 capsule (300 mg total) by mouth 3 (three) times daily. 90 capsule 1   HYDROmorphone (DILAUDID) 2 MG tablet Take 1 tablet (2 mg total) by mouth every 4 (four) hours as needed for severe pain. 3 tablet 0   lipase/protease/amylase (CREON) 36000 UNITS CPEP capsule Take 36,000 Units by mouth 3 (three) times daily before meals.     metoCLOPramide (REGLAN) 5 MG tablet Take 1 tablet (5 mg total) by mouth every 6 (six) hours as needed for nausea or vomiting. (Patient not taking: Reported on 09/22/2018) 60 tablet 1   morphine (MS CONTIN) 15 MG 12 hr tablet Take 1 tablet (15 mg total) by mouth daily. Plan to wean off in the next month 60 tablet 0   morphine (MSIR) 15 MG tablet Take 1 tablet (15 mg total) by mouth every 6 (six) hours as needed for severe pain. 60 tablet 0   ondansetron (ZOFRAN) 8 MG tablet Take 1 tablet (8 mg total) by  mouth every 8 (eight) hours as needed for nausea or vomiting. (Patient not taking: Reported on 09/22/2018) 30 tablet 0   pantoprazole (PROTONIX) 40 MG tablet Take 1 tablet (40 mg total) by mouth daily. 60 tablet 3   potassium chloride SA (K-DUR) 20 MEQ tablet Take 1 tablet (20 mEq total) by mouth daily. 30 tablet 1   promethazine (PHENERGAN) 25 MG tablet Take 1 tablet (25 mg total) by mouth every 6 (six) hours as needed for nausea or vomiting. (Patient not taking: Reported on 09/22/2018) 30 tablet 0   No current facility-administered medications for this encounter.      REVIEW OF SYSTEMS: On review of systems, the patient reports that she is doing well overall. She denies any chest pain, shortness of breath, cough, fevers, chills, night sweats. She denies any bowel or  bladder disturbances, and continues to have chronic abdominal pain, she also continues to have nausea and vomiting that remains chronic but slightly improved. She has lost about 50 pounds since her surgery and she reports medical oncology and surgical oncology are aware.  She denies any new musculoskeletal or joint aches or pains. A complete review of systems is obtained and is otherwise negative.     PHYSICAL EXAM:  Wt Readings from Last 3 Encounters:  09/23/18 95 lb (43.1 kg)  09/22/18 89 lb 15.2 oz (40.8 kg)  09/13/18 96 lb 4.8 oz (43.7 kg)   Unable to assess due to encounter type  ECOG = 1  0 - Asymptomatic (Fully active, able to carry on all predisease activities without restriction)  1 - Symptomatic but completely ambulatory (Restricted in physically strenuous activity but ambulatory and able to carry out work of a light or sedentary nature. For example, light housework, office work)  2 - Symptomatic, <50% in bed during the day (Ambulatory and capable of all self care but unable to carry out any work activities. Up and about more than 50% of waking hours)  3 - Symptomatic, >50% in bed, but not bedbound (Capable of only limited self-care, confined to bed or chair 50% or more of waking hours)  4 - Bedbound (Completely disabled. Cannot carry on any self-care. Totally confined to bed or chair)  5 - Death   Eustace Pen MM, Creech RH, Tormey DC, et al. [phone]). "Toxicity and response criteria of the [facility]". [place] Oncol. 5 (6): 649-55    LABORATORY DATA:  Lab Results  Component Value Date   WBC 7.2 09/23/2018   HGB 11.1 (L) 09/23/2018   HCT 33.6 (L) 09/23/2018   MCV 62.3 (L) 09/23/2018   PLT 261 09/23/2018   Lab Results  Component Value Date   NA 138 09/23/2018   K 3.5 09/23/2018   CL 105 09/23/2018   CO2 21 (L) 09/23/2018   Lab Results  Component Value Date   ALT 34 09/23/2018   AST 39 09/23/2018   ALKPHOS 116 09/23/2018   BILITOT 0.5  09/23/2018      RADIOGRAPHY: No results found.     IMPRESSION/PLAN: 1. Stage IB, cT2N0M0 borderline resectable s/p neoadjuvant chemotherapy and Whipple with positive margins and single node metastasis at the time of surgery.  Dr. Lisbeth Renshaw discusses the pathology findings and reviews the nature of pancreatic cancer and positive margins following resection. He discusses that because of the margins, he would recommend proceeding with radiotherapy to reduce the risk of local recurrence.  We discussed the risks, benefits, short, and long term effects of radiotherapy, and the  patient is interested in proceeding. Dr. Lisbeth Renshaw discusses the delivery and logistics of radiotherapy and anticipates a course of 5 weeks of radiotherapy. She will come next Wednesday for simulation. 2. Chronic abdominal pain and nausea. She continues to follow and have management of antiemetics and narcotics by Dr. Burr Medico.  3. Financial concerns and transportation concerns. I have reached out to our staff to help coordinate transportation options and to see if she qualifies for any additional grants. 4. Contraceptive Counseling. The patient is encouraged to avoid pregnancy during radiotherapy. She reports it has been months since being sexually active. She would not need a urine Hcg prior to simulation or treatment unless she became active prior to that. She is in agreement to let us know if she becomes active and would need to use condoms/birth control as she had been having cycles up until her chemotherapy.  Given current concerns for patient exposure during the COVID-19 pandemic, this encounter was conducted via telephone.  The patient has given verbal consent for this type of encounter. The time spent during this encounter was 45 minutes and 50% of that time was spent in the coordination of her care. The attendants for this meeting include Blenda Nicely, RN, Dr. Lisbeth Renshaw, Shona Simpson, [employer] [facility] and Marcelo Baldy  During the encounter,  Blenda Nicely, RN, Dr. Lisbeth Renshaw, and Shona Simpson [facility] were located at [facility] Radiation Oncology Department.  Corinna Burkman  was located at home.  The above documentation reflects my direct findings during this shared patient visit. Please see the separate note by Dr. Lisbeth Renshaw on this date for the remainder of the patient's plan of care.    Carola Rhine, PAC

## 2018-10-02 ENCOUNTER — Ambulatory Visit
Admission: RE | Admit: 2018-10-02 | Discharge: 2018-10-02 | Disposition: A | Discharge status: Home / Self Care | Payer: Medicaid Other | Source: Ambulatory Visit | Attending: Radiation Oncology | Admitting: Radiation Oncology

## 2018-10-02 ENCOUNTER — Other Ambulatory Visit: Payer: Self-pay

## 2018-10-02 ENCOUNTER — Ambulatory Visit: Payer: Medicaid Other

## 2018-10-02 DIAGNOSIS — C250 Malignant neoplasm of head of pancreas: Secondary | ICD-10-CM | POA: Insufficient documentation

## 2018-10-02 DIAGNOSIS — Z510 Encounter for antineoplastic radiation therapy: Secondary | ICD-10-CM | POA: Insufficient documentation

## 2018-10-08 DIAGNOSIS — Z510 Encounter for antineoplastic radiation therapy: Secondary | ICD-10-CM | POA: Diagnosis not present

## 2018-10-09 ENCOUNTER — Other Ambulatory Visit: Payer: Self-pay

## 2018-10-09 ENCOUNTER — Other Ambulatory Visit: Payer: Self-pay | Admitting: *Deleted

## 2018-10-09 ENCOUNTER — Other Ambulatory Visit: Payer: Self-pay | Admitting: Nurse Practitioner

## 2018-10-09 ENCOUNTER — Ambulatory Visit
Admission: RE | Admit: 2018-10-09 | Discharge: 2018-10-09 | Disposition: A | Discharge status: Home / Self Care | Payer: Medicaid Other | Source: Ambulatory Visit | Attending: Radiation Oncology | Admitting: Radiation Oncology

## 2018-10-09 DIAGNOSIS — Z510 Encounter for antineoplastic radiation therapy: Secondary | ICD-10-CM | POA: Diagnosis not present

## 2018-10-09 MED ORDER — MORPHINE SULFATE 15 MG PO TABS: 15.0000 mg | ORAL_TABLET | Freq: Four times a day (QID) | ORAL | 0 refills | Status: DC | PRN

## 2018-10-09 MED ORDER — POTASSIUM CHLORIDE CRYS ER 20 MEQ PO TBCR: 20.0000 meq | EXTENDED_RELEASE_TABLET | Freq: Every day | ORAL | 1 refills | Status: DC

## 2018-10-09 MED FILL — POTASSIUM CHLORIDE CRYS ER: 20 | 30 days supply | Qty: 30 | Fill #0

## 2018-10-09 MED FILL — MORPHINE SULFATE IR 15 MG T: 15 | 15 days supply | Qty: 60 | Fill #0

## 2018-10-09 NOTE — Progress Notes (Signed)
[place]   [phone] Fax:([phone]) [phone]   Clinic Follow up Note   Patient Care Team: Patient, No Pcp Per as PCP - General (General Practice) Milus Banister, MD as Attending Physician (Gastroenterology) Truitt Merle, MD as Consulting Physician (Hematology)  Date of Service:  10/11/2018  CHIEF COMPLAINT: F/u on pancreatic cancer  SUMMARY OF ONCOLOGIC HISTORY: Oncology History Overview Note  Cancer Staging Pancreatic cancer [facility]) Staging form: Exocrine Pancreas, AJCC 8th Edition - Clinical stage from 11/08/2017: Stage IB (cT2, cN0, cM0) - Signed by Truitt Merle, MD on 11/09/2017     Malignant neoplasm of head of pancreas ([place])  10/30/2017 Imaging   MRI Abdomen 10/30/17  IMPRESSION: 1. Marked intra and extrahepatic biliary duct dilatation with abrupt cut off of the common bile duct at the pancreatic head. This is associated with marked dilatation of the main pancreatic duct also with an abrupt cut off at the level of the pancreatic head. Pancreatic parenchyma in the body and tail of pancreas is markedly atrophic. A subtle 2.5 x 3 cm lesion with differential signal intensity is identified in the head of the pancreas which appears slightly hypoenhancing on postcontrast imaging. Together, these features are highly concerning for pancreatic adenocarcinoma. EUS is recommended to further evaluate. 2. Portal vein is patent although there is some mass-effect on the portal splenic confluence and IVC. Fat planes around celiac axis and SMA appear preserved. 3. Upper normal to borderline portal caval lymph node associated with small right para-aortic lymphadenopathy.     10/30/2017 Tumor Marker   Baseline Ca 19-9 at 140   10/31/2017 Procedure   ERCP by Dr. Lyndel Safe 10/31/17  IMPRESSION Malignant appearing CBD stricture (due to pancreatic mass) status post sphincterotomy and stent insertion.   11/08/2017 Cancer Staging   Staging form: Exocrine Pancreas,  AJCC 8th Edition - Clinical stage from 11/08/2017: Stage IB (cT2, cN0, cM0) - Signed by Truitt Merle, MD on 11/09/2017   11/08/2017 Procedure   EUS and EGD by Dr. Ardis Hughs 11/08/17  IMPRESSION -Very vague, irregularly bordered, approximately 2.6cm mass was found in the head of pancreas. This causes biliary and pancreatic duct obstruction. The previously plastic biliary stent is in good position. The mass directly abuts and attenuates the PV/SMV confluence, suggesting invasion. FNA performed and the preliminary cytology review is positive for maligancy (adenocarcinoma). - [facility] office will arrange referrals to medical and surgical oncology. - Case to be discussed at upcoming multidisciplinary GI tumor conference.   11/08/2017 Initial Biopsy   Biopsy Cytology 11/08/17 Diagnosis FINE NEEDLE ASPIRATION, ENDOSCOPIC, PANCREAS HEAD (SPECIMEN 1 OF 1 COLLECTED 11/08/17): MALIGNANT CELLS CONSISTENT WITH ADENOCARCINOMA.   11/09/2017 Initial Diagnosis   Pancreatic cancer ([place])   11/15/2017 Imaging   CT Chest and Pelvis WO Contrast 11/15/17 IMPRESSION: 1. No findings of active malignancy in the chest or pelvis. 2. Aortic Atherosclerosis (ICD10-I70.0). Coronary atherosclerosis. 3. Faint centrilobular nodularity in the lung apices, raising the possibility of hypersensitivity pneumonitis or less likely respiratory bronchiolitis. Airway thickening is present, suggesting bronchitis or reactive airways disease.   11/19/2017 -  Chemotherapy   FOLFIRINOX q2weeks with Neulasta injection for 4 months starting 11/19/17. Completed 8 cycles on 03/07/18    02/04/2018 Imaging   CT AP W Contrast 02/04/18  IMPRESSION: 1. No substantial change in size of mass involving head of pancreas. A aortocaval lymph node which appears increased in the interval measuring 1.1 cm versus 0.8 cm previously. Stable 1.2 cm peripancreatic node. 2. Dilated proximal common bile duct is  mildly increased in caliber compared with  12/12/2017. The common bile duct stent remains in place in appears to be in appropriate position.    04/19/2018 Imaging   CT AP 04/19/18  IMPRESSION: 1. Essentially stable exam compared to most recent CT scans of 02/04/2018 and 12/12/2017. 2. Stable mass in the head of the pancreas with duct dilatation and atrophy upstream. Mass continues to surround the GDA and contacts the portal vein but is free of the celiac trunk and SMA. Mass probably contacts the RIGHT hepatic artery at the takeoff of the GDA (image 56/5). Dominant LEFT hepatic artery originates from the LEFT gastric. 3. Mild intrahepatic and extrahepatic duct dilatation similar to prior with flexible stent in the common bile duct. 4. No new or progressive disease. 5. No peritoneal metastasis.   04/25/2018 Imaging   CT Angio chest 04/25/18  IMPRESSION: 1. No demonstrable pulmonary embolus. No thoracic aortic aneurysm or dissection. 2. Areas of atelectatic change bilaterally. No consolidation. No pneumothorax. Endotracheal tube tip is slightly superior to the carina. 3. Enlarged subcarinal lymph node, concerning for neoplastic etiology. 4. 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 Biliary stent present. Biliary duct air is felt to be secondary to the stent placement. 5. Mild upper abdominal ascites. Pneumoperitoneum with apparent drainage catheter present. The pneumoperitoneum is likely of post procedural/postoperative etiology. Bowel perforation in this circumstance cannot be excluded entirely, however. Close clinical assessment in this regard advised. 6.  Left ventricular hypertrophy.   04/25/2018 Surgery   LAPAROSCOPY DIAGNOSTIC and WHIPPLE PROCEDURE, RECONSTRUCTION OF PORTAL VEIN by Dr. Barry Dienes, Dr. Marcello Moores 04/25/18    04/25/2018 Pathology Results    Diagnosis 04/25/18 1. Whipple procedure/resection, with gallbladder with segment of portal vein - INVASIVE ADENOCARCINOMA, MODERATELY DIFFERENTIATED, SPANNING  3.5 CM. - ADENOCARCINOMA IS PRESENT AT THE SUPERIOR MARGIN OF THE ATTACHED VEIN AND THE INFERIOR MARGIN OF THE ATTACHED VEIN (BLOCKS E AND F). - ADENOCARCINOMA EXTENDS INTO PERIPANCREATIC SOFT TISSUE. - PERINEURAL INVASION IS IDENTIFIED, DIFFUSE. - METASTATIC ADENOCARCINOMA IN 1 OF 24 LYMPH NODES (1/24). - SEE ONCOLOGY TABLE BELOW. 2. Lymph node, biopsy, portal - THERE IS NO EVIDENCE OF CARCINOMA IN 5 OF 5 LYMPH NODES (0/5). 3. Pancreas, biopsy, additional pancreatic margin - HIGH GRADE GLANDULAR DYSPLASIA, FOCAL    05/09/2018 Imaging   CT AP W Contrast 05/09/18 IMPRESSION: Status post Whipple's procedure. 1 surgical drain remains in the area. Pancreatic duct stent is noted.   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 It appears to be stable in size compared to prior exam.   Wall and fold thickening is seen involving right and transverse colon which may represent edema or possibly inflammation.   Aortic Atherosclerosis (ICD10-I70.0).   06/15/2018 Imaging   CT AP W Contrast 06/15/18 IMPRESSION: Changes of prior Whipple procedure. Previously seen pancreatic duct stent presumably has migrated across the choledocho-jejunostomy and is now located in the biliary ducts. No biliary or pancreatic ductal dilatation.   Severe diffuse low-density throughout the liver, likely fatty infiltration.   Wall thickening within the ascending colon and hepatic flexure concerning for colitis.   Small amount of free fluid in the pelvis.   Aortic atherosclerosis.   07/25/2018 PET scan   PET 07/25/18  IMPRESSION: 1. Right hepatic lobe hypermetabolism, in the setting of heterogeneous hepatic steatosis. Finding is highly suspicious for isolated hepatic metastasis. 2. More equivocal lower level hypermetabolism along the  medial hepatic capsule and within the hepatic dome. These areas could be  postoperative and physiologic respectively. Recommend attention on follow-up. 3. Coronary artery atherosclerosis. Aortic Atherosclerosis (ICD10-I70.0).   08/13/2018 Pathology Results   Diagnosis 08/13/18 Liver, needle/core biopsy, right - LIVER PARENCHYMA WITH A BILE EXTRAVASATION AND HISTIOCYTES - SEE COMMENT   10/09/2018 -  Radiation Therapy   Adjuvant radiation with Dr. Lisbeth Renshaw 10/09/18-11/18/18      CURRENT THERAPY:  Radiation starting 10/09/18-11/18/18  INTERVAL HISTORY:  Jane Brooks is here for a follow up pancreatic cancer. She presents to the clinic alone. She notes she started RT 2 days ago, but did not go yesterday because of nausea and vomiting from colonitis. She went to ED once her antiemetics did not help her. She is agreeable with weakly IVFs. She notes she is on 64m MS Contin BID and short acting morphine. She notes no N&V today and has been able to take her meds. She plans to eat well today.   REVIEW OF SYSTEMS:   Constitutional: Denies fevers, chills (+) Low food intake, weight loss  Eyes: Denies blurriness of vision Ears, nose, mouth, throat, and face: Denies mucositis or sore throat Respiratory: Denies cough, dyspnea or wheezes Cardiovascular: Denies palpitation, chest discomfort or lower extremity swelling Gastrointestinal:  Denies heartburn or change in bowel habits (+) N&V (+) Abdominal cramping  Skin: Denies abnormal skin rashes Lymphatics: Denies new lymphadenopathy or easy bruising Neurological:Denies numbness, tingling or new weaknesses Behavioral/Psych: Mood is stable, no new changes  All other systems were reviewed with the patient and are negative.  MEDICAL HISTORY:  Past Medical History:  Diagnosis Date  . Family history of lung cancer   . Family history of prostate cancer   . Family history of uterine cancer   . Gallstones 10/2017  . Pancreatic cancer ([place]   . Pneumothorax, closed, traumatic    years ago  . PONV (postoperative nausea and  vomiting)     SURGICAL HISTORY: Past Surgical History:  Procedure Laterality Date  . BILIARY STENT PLACEMENT  10/31/2017   Procedure: BILIARY STENT PLACEMENT;  Surgeon: GJackquline Denmark MD;  [facility];  Service: Gastroenterology;;  . ERCP N/A 10/31/2017   Procedure: ENDOSCOPIC RETROGRADE CHOLANGIOPANCREATOGRAPHY (ERCP);  Surgeon: GJackquline Denmark MD;  [facility];  Service: Gastroenterology;  Laterality: N/A;  . ESOPHAGOGASTRODUODENOSCOPY N/A 11/08/2017   Procedure: ESOPHAGOGASTRODUODENOSCOPY (EGD);  Surgeon: JMilus Banister MD;  [facility];  Service: Endoscopy;  Laterality: N/A;  . EUS N/A 11/08/2017   Procedure: UPPER ENDOSCOPIC ULTRASOUND (EUS) RADIAL;  Surgeon: JMilus Banister MD;  [facility];  Service: Endoscopy;  Laterality: N/A;  . FINE NEEDLE ASPIRATION  11/08/2017   Procedure: FINE NEEDLE ASPIRATION;  Surgeon: JMilus Banister MD;  Location: [facility];  Service: Endoscopy;;  . IR IMAGING GUIDED PORT INSERTION  11/15/2017  . LAPAROSCOPY N/A 04/25/2018   Procedure: LAPAROSCOPY DIAGNOSTIC;  Surgeon: BStark Klein MD;  [facility]  Service: General;  Laterality: N/A;  GENERAL AND EPIDURAL ANESTHESIA  . SPHINCTEROTOMY  10/31/2017   Procedure: SPHINCTEROTOMY;  Surgeon: GJackquline Denmark MD;  [facility];  Service: Gastroenterology;;  . TONSILLECTOMY    . WHIPPLE PROCEDURE N/A 04/25/2018   Procedure: WHIPPLE PROCEDURE, RECONSTRUCTION OF PORTAL VEIN;  Surgeon: BStark Klein MD;  [place] [facility]  Service: General;  Laterality: N/A;    I have reviewed the social history and family history with the patient and they are unchanged from previous note.  ALLERGIES:  is allergic to  latex.  MEDICATIONS:  Current Outpatient Medications  Medication Sig Dispense Refill  . acetaminophen (TYLENOL) 325 MG tablet Take 1-2 tablets (325-650 mg total) by mouth every 4 (four) hours as needed for mild pain.    . diphenoxylate-atropine  (LOMOTIL) 2.5-0.025 MG tablet Take 1 to 2 tablets PO QID prn diarrhea 40 tablet 0  . dronabinol (MARINOL) 2.5 MG capsule Take 1 capsule (2.5 mg total) by mouth 2 (two) times daily before lunch and supper. 60 capsule 0  . gabapentin (NEURONTIN) 300 MG capsule Take 1 capsule (300 mg total) by mouth 3 (three) times daily. 90 capsule 1  . lipase/protease/amylase (CREON) 36000 UNITS CPEP capsule Take 36,000 Units by mouth 3 (three) times daily before meals.    . metoCLOPramide (REGLAN) 5 MG tablet Take 1 tablet (5 mg total) by mouth every 6 (six) hours as needed for nausea or vomiting. 60 tablet 1  . morphine (MS CONTIN) 15 MG 12 hr tablet Take 1 tablet (15 mg total) by mouth daily. Plan to wean off in the next month 60 tablet 0  . morphine (MSIR) 15 MG tablet Take 1 tablet (15 mg total) by mouth every 6 (six) hours as needed for severe pain. 60 tablet 0  . ondansetron (ZOFRAN) 8 MG tablet Take 1 tablet (8 mg total) by mouth 3 (three) times daily before meals. 90 tablet 2  . pantoprazole (PROTONIX) 40 MG tablet Take 1 tablet (40 mg total) by mouth daily. 60 tablet 3  . potassium chloride SA (K-DUR) 20 MEQ tablet Take 1 tablet (20 mEq total) by mouth daily. 30 tablet 1  . promethazine (PHENERGAN) 25 MG tablet Take 1 tablet (25 mg total) by mouth every 6 (six) hours as needed for nausea or vomiting. 30 tablet 0  . cyclobenzaprine (FEXMID) 7.5 MG tablet Take 1 tablet (7.5 mg total) by mouth 3 (three) times daily as needed for muscle spasms. 30 tablet 0  . ondansetron (ZOFRAN ODT) 8 MG disintegrating tablet Take 1 tablet (8 mg total) by mouth every 8 (eight) hours as needed for nausea or vomiting. 20 tablet 1   No current facility-administered medications for this visit.     PHYSICAL EXAMINATION: ECOG PERFORMANCE STATUS: 2 - Symptomatic, <50% confined to bed  Vitals:   10/11/18 1033  BP: 117/86  Pulse: 83  Resp: 18  Temp: 97.8 F (36.6 C)  SpO2: 100%   Filed Weights   10/11/18 1033  Weight: 91  lb 3.2 oz (41.4 kg)    GENERAL:alert, no distress and comfortable SKIN: skin color, texture, turgor are normal, no rashes or significant lesions EYES: normal, Conjunctiva are pink and non-injected, sclera clear  NECK: supple, thyroid normal size, non-tender, without nodularity LYMPH:  no palpable lymphadenopathy in the cervical, axillary  LUNGS: clear to auscultation and percussion with normal breathing effort HEART: regular rate & rhythm and no murmurs and no lower extremity edema ABDOMEN:abdomen soft, non-tender and normal bowel sounds Musculoskeletal:no cyanosis of digits and no clubbing  NEURO: alert & oriented x 3 with fluent speech, no focal motor/sensory deficits  LABORATORY DATA:  I have reviewed the data as listed CBC Latest Ref Rng & Units 10/11/2018 09/23/2018 09/23/2018  WBC 4.0 - 10.5 K/uL 7.5 7.2 6.1  Hemoglobin 12.0 - 15.0 g/dL 10.9(L) 11.1(L) 10.3(L)  Hematocrit 36.0 - 46.0 % 33.3(L) 33.6(L) 32.1(L)  Platelets 150 - 400 K/uL 295 261 232     CMP Latest Ref Rng & Units 10/11/2018 09/23/2018 09/23/2018  Glucose 70 - 99  mg/dL 84 114(H) 101(H)  BUN 6 - 20 mg/dL 10 8 9   Creatinine 0.44 - 1.00 mg/dL 0.64 0.66 0.56  Sodium 135 - 145 mmol/L 138 138 138  Potassium 3.5 - 5.1 mmol/L 3.4(L) 3.5 3.6  Chloride 98 - 111 mmol/L 103 105 105  CO2 22 - 32 mmol/L 28 21(L) 24  Calcium 8.9 - 10.3 mg/dL 8.6(L) 8.3(L) 8.3(L)  Total Protein 6.5 - 8.1 g/dL 6.4(L) 6.2(L) 5.8(L)  Total Bilirubin 0.3 - 1.2 mg/dL 0.8 0.5 0.5  Alkaline Phos 38 - 126 U/L 148(H) 116 113  AST 15 - 41 U/L 22 39 36  ALT 0 - 44 U/L 27 34 32      RADIOGRAPHIC STUDIES: I have personally reviewed the radiological images as listed and agreed with the findings in the report. No results found.   ASSESSMENT & PLAN:  Jane Brooks is a 48 y.o. female with   1. 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 -Her postoprecovery has been difficult with recurrent abdominal pain, nausea, vomiting and diarrhea, multiple ED visits and hospitalization -HerPET from 07/25/18 showedright hepatic lobe hypermetabolic activity, which is suspicious for isolated liver metastasis. There is also mild uptake of LN alongmedial hepatic capsule and within the hepatic dome, no other evidence of mets. -We discussed her liver biopsy from 08/13/18 which showsno evidence of cancer cells.  -I previously discussed there is a possibility the biopsy could have missed the suspicious mass of concern, but IRDr. Chestine Spore they got it and did not recommend a rebiopsy. I recommend following up with close scan monitoring -Her last CT on 08/21/2018 done during her ED visit showed no clear evidence of metastasis  -Due to her positive surgical margins, I referred her to Dr. Lisbeth Renshaw for radiation. She started on 10/09/18.  -She has had recurrent abdominal pain, N&V. She went to ED yesterday for IV antiemetics and fluids as oral antiemetics was not enough.  -I again encouraged her to reach out to our clinic for supportive care when we are open. I will call in sublingual Zofran and will set is weekly IV Fluids in case she needs it with RT. She is agreeable.  -Labs reviewed, Hg 10.9, K3.4. Ca 19.9 still pending.  -continue radiation  -F/u in 2-3 weeks   2. Recurrent abdominal pain, with N&V -she has been on MS Contin and morphine as needed  -We both discussed and agreed that when her pain improves we will start to wean her off narcotics. -Her abdominal pain now intermittent.  -She is currently on MS Contin 74m 1-2 times a day and short acting Morphine. Will try to wean off if tolerable.  -Her current abdominal pain is from muscle cramps. I refilled Flexeril today (10/11/18). She  also has Gabapentin.  -She has Zofran. Given her resent bout of N&V triggered by RT I will call in sublingual Zofran today and will set up weekly IV Fluids.  -I encouraged her to apophylactically take antiemetics q8hours.   3. 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 -She now has her puppy and doing well with him.  4. 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 No need blood transfusion for now  5. Genetics -She was seen by the genetic counselor.Genetic testhasbeen held due to lack of insurance.  6. Smoking Cessation  -She is currently down to smoking 3/4 ppd -I strongly encouraged her to continue to reduce and completely quit as going into surgery this can effect her recovery. She understands.  -She notes she has further cut back on smoking and continues to work on quitting completely. I encouraged her to continue.  7. Low appetite, Low weight  -She had been eating adequately but would like to gain more weight. She remains under 100 pounds, but stable lately.  -She has been taking Marijuana to help stimulate her appetite but she is interested in medication.  -I previously called in low dose Marinol 2.85m bid for her to take as needed -Her appetite is adequate lately but due to N&V she has been having low food intake.    Plan -I refilled Flexeril and called in sublingual Zofran today  -refilled zofran, I encourage her to use before each meal  -Continue Radiation  -IV Fluids weekly on Wednesdays -F/u in 2-3 weeks   No problem-specific Assessment & Plan notes found for this encounter.   No orders of the defined types were placed in this encounter.  All questions were answered. The patient knows to call the clinic with any problems, questions or concerns. No barriers to learning was detected. I spent 20 minutes counseling the  patient face to face. The total time spent in the appointment was 25 minutes and more than 50% was on counseling and review of test results     YTruitt Merle MD 10/11/2018   I, AJoslyn Devon am acting as scribe for YTruitt Merle MD.   I have reviewed the above documentation for accuracy and completeness, and I agree with the above.

## 2018-10-09 NOTE — Progress Notes (Signed)
  Radiation Oncology         [phone] ________________________________  Name: Jane Brooks MRN: [id number]  Date: 10/02/2018  DOB: 1970-02-21  SIMULATION AND TREATMENT PLANNING NOTE  DIAGNOSIS:     ICD-10-CM   1. Adenocarcinoma of head of pancreas ([place])  C25.0      Site:  abdomen  NARRATIVE:  The patient was brought to the [facility].  Identity was confirmed.  All relevant records and images related to the planned course of therapy were reviewed.   Written consent to proceed with treatment was confirmed which was freely given after reviewing the details related to the planned course of therapy had been reviewed with the patient.  Then, the patient was set-up in a stable reproducible  supine position for radiation therapy.  CT images were obtained.  Surface markings were placed.    Medically necessary complex treatment device(s) for immobilization:  Vac-lock bag.   The CT images were loaded into the planning software.  Then the target and avoidance structures were contoured.  Treatment planning then occurred.  The radiation prescription was entered and confirmed.  A total of 3 complex treatment devices were fabricated which relate to the designed radiation treatment fields. Each of these customized fields/ complex treatment devices will be used on a daily basis during the radiation course. I have requested : Intensity Modulated Radiotherapy (IMRT) is medically necessary for this case for the following reason:  Small bowel sparing, liver and kidney sparing with the target volume in close proximity to these critical normal structures.   The patient will undergo daily image guidance to ensure accurate localization of the target, and adequate minimize dose to the normal surrounding structures in close proximity to the target.   PLAN:  The patient will receive 45 Gy in 25 fractions initially. The patient will then receive a 5.4 Gy boost for a final total dose of 50.4 Gy.   ________________________________   Jodelle Gross, MD, PhD

## 2018-10-09 NOTE — Progress Notes (Signed)
  Radiation Oncology         [phone] ________________________________  Name: Zaydah Nawabi MRN: [id number]  Date: 10/02/2018  DOB: 1970-08-24  RESPIRATORY MOTION MANAGEMENT SIMULATION  NARRATIVE:  In order to account for effect of respiratory motion on target structures and other organs in the planning and delivery of radiotherapy, this patient underwent respiratory motion management simulation.  To accomplish this, when the patient was brought to the CT simulation planning suite, 4D respiratoy motion management CT images were obtained.  The CT images were loaded into the planning software.  Then, using a variety of tools including Cine, MIP, and standard views, the target volume and planning target volumes (PTV) were delineated.  Avoidance structures were contoured.  Treatment planning then occurred.  Dose volume histograms were generated and reviewed for each of the requested structure.  The resulting plan was carefully reviewed and approved today.   ------------------------------------------------  Jodelle Gross, MD, PhD

## 2018-10-10 ENCOUNTER — Ambulatory Visit: Payer: Medicaid Other

## 2018-10-10 ENCOUNTER — Telehealth: Payer: Self-pay | Admitting: *Deleted

## 2018-10-10 NOTE — Telephone Encounter (Signed)
Called pt to make aware that refills for morphine and potassium are ready for pick at pharmacy. Pt verbalized understanding

## 2018-10-11 ENCOUNTER — Inpatient Hospital Stay: Payer: Medicaid Other | Attending: Hematology

## 2018-10-11 ENCOUNTER — Inpatient Hospital Stay: Payer: Medicaid Other

## 2018-10-11 ENCOUNTER — Other Ambulatory Visit: Payer: Self-pay

## 2018-10-11 ENCOUNTER — Encounter: Payer: Self-pay | Admitting: Hematology

## 2018-10-11 ENCOUNTER — Ambulatory Visit
Admission: RE | Admit: 2018-10-11 | Discharge: 2018-10-11 | Disposition: A | Discharge status: Home / Self Care | Payer: Medicaid Other | Source: Ambulatory Visit | Attending: Radiation Oncology | Admitting: Radiation Oncology

## 2018-10-11 ENCOUNTER — Inpatient Hospital Stay (HOSPITAL_BASED_OUTPATIENT_CLINIC_OR_DEPARTMENT_OTHER): Payer: Medicaid Other | Admitting: Hematology

## 2018-10-11 ENCOUNTER — Telehealth: Payer: Self-pay | Admitting: Hematology

## 2018-10-11 VITALS — BP 117/86 | HR 83 | Temp 97.8°F | Resp 18 | Ht <= 58 in | Wt 91.2 lb

## 2018-10-11 DIAGNOSIS — Z23 Encounter for immunization: Secondary | ICD-10-CM | POA: Diagnosis not present

## 2018-10-11 DIAGNOSIS — F17210 Nicotine dependence, cigarettes, uncomplicated: Secondary | ICD-10-CM | POA: Diagnosis not present

## 2018-10-11 DIAGNOSIS — Z923 Personal history of irradiation: Secondary | ICD-10-CM | POA: Insufficient documentation

## 2018-10-11 DIAGNOSIS — I10 Essential (primary) hypertension: Secondary | ICD-10-CM

## 2018-10-11 DIAGNOSIS — R112 Nausea with vomiting, unspecified: Secondary | ICD-10-CM | POA: Diagnosis not present

## 2018-10-11 DIAGNOSIS — Z79899 Other long term (current) drug therapy: Secondary | ICD-10-CM | POA: Insufficient documentation

## 2018-10-11 DIAGNOSIS — Z8042 Family history of malignant neoplasm of prostate: Secondary | ICD-10-CM | POA: Insufficient documentation

## 2018-10-11 DIAGNOSIS — Z510 Encounter for antineoplastic radiation therapy: Secondary | ICD-10-CM | POA: Diagnosis not present

## 2018-10-11 DIAGNOSIS — D563 Thalassemia minor: Secondary | ICD-10-CM | POA: Insufficient documentation

## 2018-10-11 DIAGNOSIS — C250 Malignant neoplasm of head of pancreas: Secondary | ICD-10-CM

## 2018-10-11 DIAGNOSIS — I2510 Atherosclerotic heart disease of native coronary artery without angina pectoris: Secondary | ICD-10-CM | POA: Diagnosis not present

## 2018-10-11 DIAGNOSIS — R109 Unspecified abdominal pain: Secondary | ICD-10-CM | POA: Insufficient documentation

## 2018-10-11 DIAGNOSIS — I700 Atherosclerosis of aorta: Secondary | ICD-10-CM | POA: Diagnosis not present

## 2018-10-11 DIAGNOSIS — D509 Iron deficiency anemia, unspecified: Secondary | ICD-10-CM | POA: Diagnosis not present

## 2018-10-11 DIAGNOSIS — Z801 Family history of malignant neoplasm of trachea, bronchus and lung: Secondary | ICD-10-CM | POA: Insufficient documentation

## 2018-10-11 DIAGNOSIS — C257 Malignant neoplasm of other parts of pancreas: Secondary | ICD-10-CM | POA: Diagnosis not present

## 2018-10-11 LAB — CBC WITH DIFFERENTIAL (CANCER CENTER ONLY)
Abs Immature Granulocytes: 0.03 10*3/uL (ref 0.00–0.07)
Basophils Absolute: 0 10*3/uL (ref 0.0–0.1)
Basophils Relative: 0 %
Eosinophils Absolute: 0.1 10*3/uL (ref 0.0–0.5)
Eosinophils Relative: 1 %
HCT: 33.3 % — ABNORMAL LOW (ref 36.0–46.0)
Hemoglobin: 10.9 g/dL — ABNORMAL LOW (ref 12.0–15.0)
Immature Granulocytes: 0 %
Lymphocytes Relative: 22 %
Lymphs Abs: 1.6 10*3/uL (ref 0.7–4.0)
MCH: 20.3 pg — ABNORMAL LOW (ref 26.0–34.0)
MCHC: 32.7 g/dL (ref 30.0–36.0)
MCV: 62.1 fL — ABNORMAL LOW (ref 80.0–100.0)
Monocytes Absolute: 0.5 10*3/uL (ref 0.1–1.0)
Monocytes Relative: 7 %
Neutro Abs: 5.3 10*3/uL (ref 1.7–7.7)
Neutrophils Relative %: 70 %
Platelet Count: 295 10*3/uL (ref 150–400)
RBC: 5.36 MIL/uL — ABNORMAL HIGH (ref 3.87–5.11)
RDW: 19.3 % — ABNORMAL HIGH (ref 11.5–15.5)
WBC Count: 7.5 10*3/uL (ref 4.0–10.5)
nRBC: 0 % (ref 0.0–0.2)

## 2018-10-11 LAB — CMP (CANCER CENTER ONLY)
ALT: 27 U/L (ref 0–44)
AST: 22 U/L (ref 15–41)
Albumin: 3.3 g/dL — ABNORMAL LOW (ref 3.5–5.0)
Alkaline Phosphatase: 148 U/L — ABNORMAL HIGH (ref 38–126)
Anion gap: 7 (ref 5–15)
BUN: 10 mg/dL (ref 6–20)
CO2: 28 mmol/L (ref 22–32)
Calcium: 8.6 mg/dL — ABNORMAL LOW (ref 8.9–10.3)
Chloride: 103 mmol/L (ref 98–111)
Creatinine: 0.64 mg/dL (ref 0.44–1.00)
GFR, Est AFR Am: >60 mL/min (ref >60)
GFR, Estimated: >60 mL/min (ref >60)
Glucose, Bld: 84 mg/dL (ref 70–99)
Potassium: 3.4 mmol/L — ABNORMAL LOW (ref 3.5–5.1)
Sodium: 138 mmol/L (ref 135–145)
Total Bilirubin: 0.8 mg/dL (ref 0.3–1.2)
Total Protein: 6.4 g/dL — ABNORMAL LOW (ref 6.5–8.1)

## 2018-10-11 MED ORDER — ONDANSETRON 8 MG PO TBDP: 8.0000 mg | ORAL_TABLET | Freq: Three times a day (TID) | ORAL | 1 refills | Status: DC | PRN

## 2018-10-11 MED ORDER — SODIUM CHLORIDE 0.9% FLUSH
10.0000 mL | INTRAVENOUS | Status: DC | PRN
  Administered 2018-10-11: 10:00:00 10 mL
  Filled 2018-10-11: qty 10

## 2018-10-11 MED ORDER — HEPARIN SOD (PORK) LOCK FLUSH 100 UNIT/ML IV SOLN
500.0000 [IU] | Freq: Once | INTRAVENOUS | Status: AC | PRN
  Administered 2018-10-11: 500 [IU]
  Filled 2018-10-11: qty 5

## 2018-10-11 MED ORDER — CYCLOBENZAPRINE HCL 7.5 MG PO TABS: 7.5000 mg | ORAL_TABLET | Freq: Three times a day (TID) | ORAL | 0 refills | Status: AC | PRN

## 2018-10-11 MED ORDER — ONDANSETRON HCL 8 MG PO TABS: 8.0000 mg | ORAL_TABLET | Freq: Three times a day (TID) | ORAL | 2 refills | Status: DC

## 2018-10-11 MED FILL — CYCLOBENZAPRINE HCL 5 MG TA: 5 | 5 days supply | Qty: 30 | Fill #0

## 2018-10-11 MED FILL — ONDANSETRON ODT 8 MG TABLET: 8 | 6 days supply | Qty: 20 | Fill #0

## 2018-10-11 NOTE — Telephone Encounter (Signed)
Scheduled appt per 9/25 los.  Spoke with patient and she is aware of her appt date and time.  Patient stated she will get a print out when she come in Wednesday 9/30.

## 2018-10-12 LAB — CANCER ANTIGEN 19-9: CA 19-9: 48 U/mL — ABNORMAL HIGH (ref 0–35)

## 2018-10-14 ENCOUNTER — Other Ambulatory Visit: Payer: Self-pay

## 2018-10-14 ENCOUNTER — Ambulatory Visit
Admission: RE | Admit: 2018-10-14 | Discharge: 2018-10-14 | Disposition: A | Discharge status: Home / Self Care | Payer: Medicaid Other | Source: Ambulatory Visit | Attending: Radiation Oncology | Admitting: Radiation Oncology

## 2018-10-14 DIAGNOSIS — Z510 Encounter for antineoplastic radiation therapy: Secondary | ICD-10-CM | POA: Diagnosis not present

## 2018-10-14 MED FILL — PANTOPRAZOLE SOD DR 40 MG T: 40 | 60 days supply | Qty: 60 | Fill #1

## 2018-10-14 MED FILL — CREON 36000 UNIT CPEP: 36000 | 90 days supply | Qty: 270 | Fill #0

## 2018-10-14 MED FILL — GABAPENTIN 300 MG CAPSULE: 300 | 30 days supply | Qty: 90 | Fill #1

## 2018-10-15 ENCOUNTER — Other Ambulatory Visit: Payer: Self-pay

## 2018-10-15 ENCOUNTER — Ambulatory Visit
Admission: RE | Admit: 2018-10-15 | Discharge: 2018-10-15 | Disposition: A | Discharge status: Home / Self Care | Payer: Medicaid Other | Source: Ambulatory Visit | Attending: Radiation Oncology | Admitting: Radiation Oncology

## 2018-10-15 DIAGNOSIS — Z510 Encounter for antineoplastic radiation therapy: Secondary | ICD-10-CM | POA: Diagnosis not present

## 2018-10-16 ENCOUNTER — Inpatient Hospital Stay: Payer: Medicaid Other

## 2018-10-16 ENCOUNTER — Other Ambulatory Visit: Payer: Self-pay

## 2018-10-16 ENCOUNTER — Ambulatory Visit
Admission: RE | Admit: 2018-10-16 | Discharge: 2018-10-16 | Disposition: A | Discharge status: Home / Self Care | Payer: Medicaid Other | Source: Ambulatory Visit | Attending: Radiation Oncology | Admitting: Radiation Oncology

## 2018-10-16 VITALS — BP 113/70 | HR 88 | Temp 97.8°F | Resp 18

## 2018-10-16 DIAGNOSIS — C250 Malignant neoplasm of head of pancreas: Secondary | ICD-10-CM

## 2018-10-16 DIAGNOSIS — C257 Malignant neoplasm of other parts of pancreas: Secondary | ICD-10-CM | POA: Diagnosis not present

## 2018-10-16 DIAGNOSIS — Z510 Encounter for antineoplastic radiation therapy: Secondary | ICD-10-CM | POA: Diagnosis not present

## 2018-10-16 DIAGNOSIS — Z23 Encounter for immunization: Secondary | ICD-10-CM

## 2018-10-16 LAB — CMP (CANCER CENTER ONLY)
ALT: 28 U/L (ref 0–44)
AST: 40 U/L (ref 15–41)
Albumin: 3.3 g/dL — ABNORMAL LOW (ref 3.5–5.0)
Alkaline Phosphatase: 122 U/L (ref 38–126)
Anion gap: 10 (ref 5–15)
BUN: 7 mg/dL (ref 6–20)
CO2: 27 mmol/L (ref 22–32)
Calcium: 8.4 mg/dL — ABNORMAL LOW (ref 8.9–10.3)
Chloride: 102 mmol/L (ref 98–111)
Creatinine: 0.57 mg/dL (ref 0.44–1.00)
GFR, Est AFR Am: >60 mL/min (ref >60)
GFR, Estimated: >60 mL/min (ref >60)
Glucose, Bld: 70 mg/dL (ref 70–99)
Potassium: 4.1 mmol/L (ref 3.5–5.1)
Sodium: 139 mmol/L (ref 135–145)
Total Bilirubin: 0.7 mg/dL (ref 0.3–1.2)
Total Protein: 6.4 g/dL — ABNORMAL LOW (ref 6.5–8.1)

## 2018-10-16 LAB — CBC WITH DIFFERENTIAL (CANCER CENTER ONLY)
Abs Immature Granulocytes: 0.04 10*3/uL (ref 0.00–0.07)
Basophils Absolute: 0 10*3/uL (ref 0.0–0.1)
Basophils Relative: 0 %
Eosinophils Absolute: 0.1 10*3/uL (ref 0.0–0.5)
Eosinophils Relative: 1 %
HCT: 34.5 % — ABNORMAL LOW (ref 36.0–46.0)
Hemoglobin: 11.2 g/dL — ABNORMAL LOW (ref 12.0–15.0)
Immature Granulocytes: 1 %
Lymphocytes Relative: 13 %
Lymphs Abs: 0.8 10*3/uL (ref 0.7–4.0)
MCH: 20.2 pg — ABNORMAL LOW (ref 26.0–34.0)
MCHC: 32.5 g/dL (ref 30.0–36.0)
MCV: 62.3 fL — ABNORMAL LOW (ref 80.0–100.0)
Monocytes Absolute: 0.4 10*3/uL (ref 0.1–1.0)
Monocytes Relative: 7 %
Neutro Abs: 4.7 10*3/uL (ref 1.7–7.7)
Neutrophils Relative %: 78 %
Platelet Count: 280 10*3/uL (ref 150–400)
RBC: 5.54 MIL/uL — ABNORMAL HIGH (ref 3.87–5.11)
RDW: 19 % — ABNORMAL HIGH (ref 11.5–15.5)
WBC Count: 6 10*3/uL (ref 4.0–10.5)
nRBC: 0.3 % — ABNORMAL HIGH (ref 0.0–0.2)

## 2018-10-16 MED ORDER — HEPARIN SOD (PORK) LOCK FLUSH 100 UNIT/ML IV SOLN
500.0000 [IU] | Freq: Once | INTRAVENOUS | Status: AC | PRN
  Administered 2018-10-16: 500 [IU]
  Filled 2018-10-16: qty 5

## 2018-10-16 MED ORDER — SODIUM CHLORIDE 0.9% FLUSH
10.0000 mL | INTRAVENOUS | Status: DC | PRN
  Administered 2018-10-16: 10 mL
  Filled 2018-10-16: qty 10

## 2018-10-16 MED ORDER — ONDANSETRON HCL 4 MG/2ML IJ SOLN
8.0000 mg | Freq: Once | INTRAMUSCULAR | Status: AC
  Administered 2018-10-16: 8 mg via INTRAVENOUS

## 2018-10-16 MED ORDER — DEXAMETHASONE SODIUM PHOSPHATE 10 MG/ML IJ SOLN
INTRAMUSCULAR | Status: AC
  Filled 2018-10-16: qty 1

## 2018-10-16 MED ORDER — ONDANSETRON HCL 4 MG/2ML IJ SOLN
INTRAMUSCULAR | Status: AC
  Filled 2018-10-16: qty 4

## 2018-10-16 MED ORDER — INFLUENZA VAC SPLIT QUAD 0.5 ML IM SUSY
PREFILLED_SYRINGE | INTRAMUSCULAR | Status: AC
  Filled 2018-10-16: qty 0.5

## 2018-10-16 MED ORDER — SODIUM CHLORIDE 0.9% FLUSH
10.0000 mL | Freq: Once | INTRAVENOUS | Status: AC | PRN
  Administered 2018-10-16: 10 mL
  Filled 2018-10-16: qty 10

## 2018-10-16 MED ORDER — SODIUM CHLORIDE 0.9 % IV SOLN
Freq: Once | INTRAVENOUS | Status: AC
  Administered 2018-10-16: 09:00:00 via INTRAVENOUS
  Filled 2018-10-16: qty 250

## 2018-10-16 MED ORDER — DEXAMETHASONE SODIUM PHOSPHATE 10 MG/ML IJ SOLN
8.0000 mg | Freq: Once | INTRAMUSCULAR | Status: AC
  Administered 2018-10-16: 09:00:00 8 mg via INTRAVENOUS

## 2018-10-16 MED ORDER — INFLUENZA VAC SPLIT QUAD 0.5 ML IM SUSY
0.5000 mL | PREFILLED_SYRINGE | Freq: Once | INTRAMUSCULAR | Status: AC
  Administered 2018-10-16: 0.5 mL via INTRAMUSCULAR

## 2018-10-16 MED ORDER — SODIUM CHLORIDE 0.9 % IV SOLN: Freq: Once | INTRAVENOUS | Status: DC

## 2018-10-16 NOTE — Patient Instructions (Signed)
Coronavirus (COVID-19) Are you at risk?  Are you at risk for the Coronavirus (COVID-19)?  To be considered HIGH RISK for Coronavirus (COVID-19), you have to meet the following criteria:  . Traveled to [place], [place], [place], [place] or [place]; or in the [place] to [place], [place], [place], or [place]; and have fever, cough, and shortness of breath within the last 2 weeks of travel OR . Been in close contact with a person diagnosed with COVID-19 within the last 2 weeks and have fever, cough, and shortness of breath . IF YOU DO NOT MEET THESE CRITERIA, YOU ARE CONSIDERED LOW RISK FOR COVID-19.  What to do if you are HIGH RISK for COVID-19?  Marland Kitchen If you are having a medical emergency, call 911. . Seek medical care right away. Before you go to a doctor's office, urgent care or emergency department, call ahead and tell them about your recent travel, contact with someone diagnosed with COVID-19, and your symptoms. You should receive instructions from your physician's office regarding next steps of care.  . When you arrive at healthcare provider, tell the healthcare staff immediately you have returned from visiting [place], [place], [place], [place] or [place]; or traveled in the [place] to [place], [place], [place], or [place]; in the last two weeks or you have been in close contact with a person diagnosed with COVID-19 in the last 2 weeks.   . Tell the health care staff about your symptoms: fever, cough and shortness of breath. . After you have been seen by a medical provider, you will be either: o Tested for (COVID-19) and discharged home on quarantine except to seek medical care if symptoms worsen, and asked to  - Stay home and avoid contact with others until you get your results (4-5 days)  - Avoid travel on public transportation if possible (such as bus, train, or airplane) or o Sent to the Emergency Department by EMS for evaluation, COVID-19 testing, and possible  admission depending on your condition and test results.  What to do if you are LOW RISK for COVID-19?  Reduce your risk of any infection by using the same precautions used for avoiding the common cold or flu:  Marland Kitchen Wash your hands often with soap and warm water for at least 20 seconds.  If soap and water are not readily available, use an alcohol-based hand sanitizer with at least 60% alcohol.  . If coughing or sneezing, cover your mouth and nose by coughing or sneezing into the elbow areas of your shirt or coat, into a tissue or into your sleeve (not your hands). . Avoid shaking hands with others and consider head nods or verbal greetings only. . Avoid touching your eyes, nose, or mouth with unwashed hands.  . Avoid close contact with people who are sick. . Avoid places or events with large numbers of people in one location, like concerts or sporting events. . Carefully consider travel plans you have or are making. . If you are planning any travel outside or inside the [place], visit the CDC's Travelers' Health webpage for the latest health notices. . If you have some symptoms but not all symptoms, continue to monitor at home and seek medical attention if your symptoms worsen. . If you are having a medical emergency, call 911.   [place] / e-Visit: [email or web address]         [facility]: [place]  [facility]: [facility]: [phone]    Dehydration, Adult  Dehydration is a condition in which there is not enough fluid or water in the body. This happens when you lose more fluids than you take in. Important organs, such as the kidneys, brain, and heart, cannot function without a proper amount of fluids. Any loss of fluids from the body can lead to dehydration. Dehydration can range from mild to severe. This condition  should be treated right away to prevent it from becoming severe. What are the causes? This condition may be caused by:  Vomiting.  Diarrhea.  Excessive sweating, such as from heat exposure or exercise.  Not drinking enough fluid, especially: ? When ill. ? While doing activity that requires a lot of energy.  Excessive urination.  Fever.  Infection.  Certain medicines, such as medicines that cause the body to lose excess fluid (diuretics).  Inability to access safe drinking water.  Reduced physical ability to get adequate water and food. What increases the risk? This condition is more likely to develop in people:  Who have a poorly controlled long-term (chronic) illness, such as diabetes, heart disease, or kidney disease.  Who are age 49 or older.  Who are disabled.  Who live in a place with high altitude.  Who play endurance sports. What are the signs or symptoms? Symptoms of mild dehydration may include:  Thirst.  Dry lips.  Slightly dry mouth.  Dry, warm skin.  Dizziness. Symptoms of moderate dehydration may include:  Very dry mouth.  Muscle cramps.  Dark urine. Urine may be the color of tea.  Decreased urine production.  Decreased tear production.  Heartbeat that is irregular or faster than normal (palpitations).  Headache.  Light-headedness, especially when you stand up from a sitting position.  Fainting (syncope). Symptoms of severe dehydration may include:  Changes in skin, such as: ? Cold and clammy skin. ? Blotchy (mottled) or pale skin. ? Skin that does not quickly return to normal after being lightly pinched and released (poor skin turgor).  Changes in body fluids, such as: ? Extreme thirst. ? No tear production. ? Inability to sweat when body temperature is high, such as in hot weather. ? Very little urine production.  Changes in vital signs, such as: ? Weak pulse. ? Pulse that is more than 100 beats a minute when sitting  still. ? Rapid breathing. ? Low blood pressure.  Other changes, such as: ? Sunken eyes. ? Cold hands and feet. ? Confusion. ? Lack of energy (lethargy). ? Difficulty waking up from sleep. ? Short-term weight loss. ? Unconsciousness. How is this diagnosed? This condition is diagnosed based on your symptoms and a physical exam. Blood and urine tests may be done to help confirm the diagnosis. How is this treated? Treatment for this condition depends on the severity. Mild or moderate dehydration can often be treated at home. Treatment should be started right away. Do not wait until dehydration becomes severe. Severe dehydration is an emergency and it needs to be treated in a hospital. Treatment for mild dehydration may include:  Drinking more fluids.  Replacing salts and minerals in your blood (electrolytes) that you may have lost. Treatment for moderate dehydration may include:  Drinking an oral rehydration solution (ORS). This is a drink that helps you replace fluids and electrolytes (rehydrate). It can be found at pharmacies and retail  stores. Treatment for severe dehydration may include:  Receiving fluids through an IV tube.  Receiving an electrolyte solution through a feeding tube that is passed through your nose and into your stomach (nasogastric tube, or NG tube).  Correcting any abnormalities in electrolytes.  Treating the underlying cause of dehydration. Follow these instructions at home:  If directed by your health care provider, drink an ORS: ? Make an ORS by following instructions on the package. ? Start by drinking small amounts, about  cup (120 mL) every 5-10 minutes. ? Slowly increase how much you drink until you have taken the amount recommended by your health care provider.  Drink enough clear fluid to keep your urine clear or pale yellow. If you were told to drink an ORS, finish the ORS first, then start slowly drinking other clear fluids. Drink fluids such as:  ? Water. Do not drink only water. Doing that can lead to having too little salt (sodium) in the body (hyponatremia). ? Ice chips. ? Fruit juice that you have added water to (diluted fruit juice). ? Low-calorie sports drinks.  Avoid: ? Alcohol. ? Drinks that contain a lot of sugar. These include high-calorie sports drinks, fruit juice that is not diluted, and soda. ? Caffeine. ? Foods that are greasy or contain a lot of fat or sugar.  Take over-the-counter and prescription medicines only as told by your health care provider.  Do not take sodium tablets. This can lead to having too much sodium in the body (hypernatremia).  Eat foods that contain a healthy balance of electrolytes, such as bananas, oranges, potatoes, tomatoes, and spinach.  Keep all follow-up visits as told by your health care provider. This is important. Contact a health care provider if:  You have abdominal pain that: ? Gets worse. ? Stays in one area (localizes).  You have a rash.  You have a stiff neck.  You are more irritable than usual.  You are sleepier or more difficult to wake up than usual.  You feel weak or dizzy.  You feel very thirsty.  You have urinated only a small amount of very dark urine over 6-8 hours. Get help right away if:  You have symptoms of severe dehydration.  You cannot drink fluids without vomiting.  Your symptoms get worse with treatment.  You have a fever.  You have a severe headache.  You have vomiting or diarrhea that: ? Gets worse. ? Does not go away.  You have blood or green matter (bile) in your vomit.  You have blood in your stool. This may cause stool to look black and tarry.  You have not urinated in 6-8 hours.  You faint.  Your heart rate while sitting still is over 100 beats a minute.  You have trouble breathing. This information is not intended to replace advice given to you by your health care provider. Make sure you discuss any questions you have  with your health care provider. Document Released: 01/02/2005 Document Revised: 12/15/2016 Document Reviewed: 02/26/2015 Elsevier Patient Education  2020 [employer].

## 2018-10-17 ENCOUNTER — Ambulatory Visit
Admission: RE | Admit: 2018-10-17 | Discharge: 2018-10-17 | Disposition: A | Discharge status: Home / Self Care | Payer: Medicaid Other | Source: Ambulatory Visit | Attending: Radiation Oncology | Admitting: Radiation Oncology

## 2018-10-17 ENCOUNTER — Other Ambulatory Visit: Payer: Self-pay

## 2018-10-17 DIAGNOSIS — C250 Malignant neoplasm of head of pancreas: Secondary | ICD-10-CM | POA: Insufficient documentation

## 2018-10-17 DIAGNOSIS — Z510 Encounter for antineoplastic radiation therapy: Secondary | ICD-10-CM | POA: Insufficient documentation

## 2018-10-18 ENCOUNTER — Other Ambulatory Visit: Payer: Self-pay

## 2018-10-18 ENCOUNTER — Ambulatory Visit
Admission: RE | Admit: 2018-10-18 | Discharge: 2018-10-18 | Disposition: A | Discharge status: Home / Self Care | Payer: Medicaid Other | Source: Ambulatory Visit | Attending: Radiation Oncology | Admitting: Radiation Oncology

## 2018-10-18 DIAGNOSIS — Z510 Encounter for antineoplastic radiation therapy: Secondary | ICD-10-CM | POA: Diagnosis not present

## 2018-10-20 ENCOUNTER — Other Ambulatory Visit: Payer: Self-pay

## 2018-10-20 ENCOUNTER — Observation Stay (HOSPITAL_COMMUNITY)
Admission: EM | Admit: 2018-10-20 | Discharge: 2018-10-21 | Disposition: A | Discharge status: Home / Self Care | Payer: Medicaid Other | Attending: Internal Medicine | Admitting: Internal Medicine

## 2018-10-20 ENCOUNTER — Encounter (HOSPITAL_COMMUNITY): Payer: Self-pay

## 2018-10-20 DIAGNOSIS — R197 Diarrhea, unspecified: Secondary | ICD-10-CM | POA: Diagnosis present

## 2018-10-20 DIAGNOSIS — Z79899 Other long term (current) drug therapy: Secondary | ICD-10-CM | POA: Diagnosis not present

## 2018-10-20 DIAGNOSIS — C259 Malignant neoplasm of pancreas, unspecified: Secondary | ICD-10-CM | POA: Diagnosis not present

## 2018-10-20 DIAGNOSIS — Z91040 Latex allergy status: Secondary | ICD-10-CM | POA: Diagnosis not present

## 2018-10-20 DIAGNOSIS — R112 Nausea with vomiting, unspecified: Secondary | ICD-10-CM

## 2018-10-20 DIAGNOSIS — Z9221 Personal history of antineoplastic chemotherapy: Secondary | ICD-10-CM | POA: Diagnosis not present

## 2018-10-20 DIAGNOSIS — D6481 Anemia due to antineoplastic chemotherapy: Secondary | ICD-10-CM | POA: Diagnosis not present

## 2018-10-20 DIAGNOSIS — K5100 Ulcerative (chronic) pancolitis without complications: Secondary | ICD-10-CM | POA: Diagnosis not present

## 2018-10-20 DIAGNOSIS — Z90411 Acquired partial absence of pancreas: Secondary | ICD-10-CM | POA: Diagnosis not present

## 2018-10-20 DIAGNOSIS — K529 Noninfective gastroenteritis and colitis, unspecified: Principal | ICD-10-CM | POA: Insufficient documentation

## 2018-10-20 DIAGNOSIS — D509 Iron deficiency anemia, unspecified: Secondary | ICD-10-CM | POA: Diagnosis not present

## 2018-10-20 DIAGNOSIS — C250 Malignant neoplasm of head of pancreas: Secondary | ICD-10-CM | POA: Insufficient documentation

## 2018-10-20 DIAGNOSIS — E876 Hypokalemia: Secondary | ICD-10-CM | POA: Insufficient documentation

## 2018-10-20 DIAGNOSIS — Z20828 Contact with and (suspected) exposure to other viral communicable diseases: Secondary | ICD-10-CM | POA: Diagnosis not present

## 2018-10-20 DIAGNOSIS — D563 Thalassemia minor: Secondary | ICD-10-CM | POA: Diagnosis not present

## 2018-10-20 DIAGNOSIS — R1084 Generalized abdominal pain: Secondary | ICD-10-CM

## 2018-10-20 DIAGNOSIS — F17210 Nicotine dependence, cigarettes, uncomplicated: Secondary | ICD-10-CM | POA: Insufficient documentation

## 2018-10-20 LAB — COMPREHENSIVE METABOLIC PANEL
ALT: 24 U/L (ref 0–44)
AST: 26 U/L (ref 15–41)
Albumin: 3.1 g/dL — ABNORMAL LOW (ref 3.5–5.0)
Alkaline Phosphatase: 91 U/L (ref 38–126)
Anion gap: 8 (ref 5–15)
BUN: 7 mg/dL (ref 6–20)
CO2: 22 mmol/L (ref 22–32)
Calcium: 8.5 mg/dL — ABNORMAL LOW (ref 8.9–10.3)
Chloride: 105 mmol/L (ref 98–111)
Creatinine, Ser: 0.4 mg/dL — ABNORMAL LOW (ref 0.44–1.00)
GFR calc Af Amer: >60 mL/min (ref >60)
GFR calc non Af Amer: >60 mL/min (ref >60)
Glucose, Bld: 88 mg/dL (ref 70–99)
Potassium: 2.9 mmol/L — ABNORMAL LOW (ref 3.5–5.1)
Sodium: 135 mmol/L (ref 135–145)
Total Bilirubin: 0.8 mg/dL (ref 0.3–1.2)
Total Protein: 5.9 g/dL — ABNORMAL LOW (ref 6.5–8.1)

## 2018-10-20 LAB — CBC
HCT: 30.4 % — ABNORMAL LOW (ref 36.0–46.0)
Hemoglobin: 9.8 g/dL — ABNORMAL LOW (ref 12.0–15.0)
MCH: 20.4 pg — ABNORMAL LOW (ref 26.0–34.0)
MCHC: 32.2 g/dL (ref 30.0–36.0)
MCV: 63.2 fL — ABNORMAL LOW (ref 80.0–100.0)
Platelets: 196 10*3/uL (ref 150–400)
RBC: 4.81 MIL/uL (ref 3.87–5.11)
RDW: 18.8 % — ABNORMAL HIGH (ref 11.5–15.5)
WBC: 5.6 10*3/uL (ref 4.0–10.5)
nRBC: 0.4 % — ABNORMAL HIGH (ref 0.0–0.2)

## 2018-10-20 LAB — LACTIC ACID, PLASMA: Lactic Acid, Venous: 1.1 mmol/L (ref 0.5–1.9)

## 2018-10-20 MED ORDER — PANCRELIPASE (LIP-PROT-AMYL) 12000-38000 UNITS PO CPEP
36000.0000 [IU] | ORAL_CAPSULE | Freq: Three times a day (TID) | ORAL | Status: DC
  Administered 2018-10-20 – 2018-10-21 (×3): 36000 [IU] via ORAL
  Filled 2018-10-20 (×3): qty 3

## 2018-10-20 MED ORDER — ONDANSETRON HCL 4 MG/2ML IJ SOLN
4.0000 mg | Freq: Once | INTRAMUSCULAR | Status: AC
  Administered 2018-10-20: 13:00:00 4 mg via INTRAVENOUS
  Filled 2018-10-20: qty 2

## 2018-10-20 MED ORDER — SODIUM CHLORIDE 0.9 % IV SOLN
3.0000 g | Freq: Four times a day (QID) | INTRAVENOUS | Status: DC
  Administered 2018-10-20 – 2018-10-21 (×3): 3 g via INTRAVENOUS
  Filled 2018-10-20 (×2): qty 3
  Filled 2018-10-20: qty 8
  Filled 2018-10-20: qty 3

## 2018-10-20 MED ORDER — DRONABINOL 2.5 MG PO CAPS
2.5000 mg | ORAL_CAPSULE | Freq: Two times a day (BID) | ORAL | Status: DC
  Administered 2018-10-20 – 2018-10-21 (×2): 2.5 mg via ORAL
  Filled 2018-10-20 (×2): qty 1

## 2018-10-20 MED ORDER — SODIUM CHLORIDE 0.9 % IV SOLN
INTRAVENOUS | Status: DC
  Administered 2018-10-20 – 2018-10-21 (×2): via INTRAVENOUS

## 2018-10-20 MED ORDER — GABAPENTIN 300 MG PO CAPS
300.0000 mg | ORAL_CAPSULE | Freq: Three times a day (TID) | ORAL | Status: DC
  Administered 2018-10-20 – 2018-10-21 (×2): 300 mg via ORAL
  Filled 2018-10-20 (×3): qty 1

## 2018-10-20 MED ORDER — HYDROMORPHONE HCL 1 MG/ML IJ SOLN
0.5000 mg | INTRAMUSCULAR | Status: DC | PRN
  Administered 2018-10-20 – 2018-10-21 (×7): 0.5 mg via INTRAVENOUS
  Filled 2018-10-20 (×7): qty 0.5

## 2018-10-20 MED ORDER — CHLORHEXIDINE GLUCONATE CLOTH 2 % EX PADS
6.0000 | MEDICATED_PAD | Freq: Every day | CUTANEOUS | Status: DC
  Administered 2018-10-20 – 2018-10-21 (×2): 6 via TOPICAL

## 2018-10-20 MED ORDER — PANTOPRAZOLE SODIUM 40 MG IV SOLR
40.0000 mg | INTRAVENOUS | Status: DC
  Administered 2018-10-20: 40 mg via INTRAVENOUS
  Filled 2018-10-20: qty 40

## 2018-10-20 MED ORDER — SODIUM CHLORIDE 0.9 % IV BOLUS
1000.0000 mL | Freq: Once | INTRAVENOUS | Status: AC
  Administered 2018-10-20: 13:00:00 1000 mL via INTRAVENOUS

## 2018-10-20 MED ORDER — MORPHINE SULFATE 15 MG PO TABS: 15.0000 mg | ORAL_TABLET | Freq: Four times a day (QID) | ORAL | Status: DC | PRN

## 2018-10-20 MED ORDER — ONDANSETRON HCL 4 MG PO TABS: 4.0000 mg | ORAL_TABLET | Freq: Four times a day (QID) | ORAL | Status: DC | PRN

## 2018-10-20 MED ORDER — MORPHINE SULFATE 15 MG PO TABS
15.0000 mg | ORAL_TABLET | Freq: Four times a day (QID) | ORAL | Status: DC | PRN
  Administered 2018-10-20 – 2018-10-21 (×2): 15 mg via ORAL
  Filled 2018-10-20 (×2): qty 1

## 2018-10-20 MED ORDER — POTASSIUM CHLORIDE 10 MEQ/100ML IV SOLN
10.0000 meq | INTRAVENOUS | Status: AC
  Administered 2018-10-20 (×2): 10 meq via INTRAVENOUS
  Filled 2018-10-20 (×2): qty 100

## 2018-10-20 MED ORDER — ACETAMINOPHEN 325 MG PO TABS: 325.0000 mg | ORAL_TABLET | ORAL | Status: DC | PRN

## 2018-10-20 MED ORDER — ONDANSETRON HCL 4 MG/2ML IJ SOLN
4.0000 mg | Freq: Four times a day (QID) | INTRAMUSCULAR | Status: DC | PRN
  Administered 2018-10-20 – 2018-10-21 (×2): 4 mg via INTRAVENOUS
  Filled 2018-10-20 (×2): qty 2

## 2018-10-20 MED ORDER — MORPHINE SULFATE ER 15 MG PO TBCR
15.0000 mg | EXTENDED_RELEASE_TABLET | Freq: Every day | ORAL | Status: DC
  Administered 2018-10-21: 15 mg via ORAL
  Filled 2018-10-20: qty 1

## 2018-10-20 MED ORDER — CIPROFLOXACIN IN D5W 400 MG/200ML IV SOLN
400.0000 mg | Freq: Once | INTRAVENOUS | Status: AC
  Administered 2018-10-20: 13:00:00 400 mg via INTRAVENOUS
  Filled 2018-10-20: qty 200

## 2018-10-20 MED ORDER — HYDROMORPHONE HCL 1 MG/ML IJ SOLN
0.5000 mg | Freq: Once | INTRAMUSCULAR | Status: AC
  Administered 2018-10-20: 0.5 mg via INTRAVENOUS
  Filled 2018-10-20: qty 1

## 2018-10-20 MED ORDER — SODIUM CHLORIDE 0.9% FLUSH
3.0000 mL | Freq: Two times a day (BID) | INTRAVENOUS | Status: DC
  Administered 2018-10-21: 3 mL via INTRAVENOUS

## 2018-10-20 MED ORDER — NICOTINE 14 MG/24HR TD PT24
14.0000 mg | MEDICATED_PATCH | TRANSDERMAL | Status: DC
  Administered 2018-10-20: 18:00:00 14 mg via TRANSDERMAL
  Filled 2018-10-20: qty 1

## 2018-10-20 MED ORDER — HYDROMORPHONE HCL 1 MG/ML IJ SOLN
0.5000 mg | INTRAMUSCULAR | Status: AC
  Administered 2018-10-20 (×2): 0.5 mg via INTRAVENOUS
  Filled 2018-10-20: qty 1
  Filled 2018-10-20: qty 0.5

## 2018-10-20 MED ORDER — POTASSIUM CHLORIDE CRYS ER 20 MEQ PO TBCR: 20.0000 meq | EXTENDED_RELEASE_TABLET | Freq: Every day | ORAL | Status: DC

## 2018-10-20 MED ORDER — CYCLOBENZAPRINE HCL 5 MG PO TABS: 7.5000 mg | ORAL_TABLET | Freq: Three times a day (TID) | ORAL | Status: DC | PRN

## 2018-10-20 MED ORDER — METRONIDAZOLE IN NACL 5-0.79 MG/ML-% IV SOLN
500.0000 mg | Freq: Once | INTRAVENOUS | Status: AC
  Administered 2018-10-20: 14:00:00 500 mg via INTRAVENOUS
  Filled 2018-10-20: qty 100

## 2018-10-20 MED ORDER — HYDROMORPHONE HCL 1 MG/ML IJ SOLN
1.0000 mg | Freq: Once | INTRAMUSCULAR | Status: AC
  Administered 2018-10-20: 1 mg via INTRAVENOUS
  Filled 2018-10-20: qty 1

## 2018-10-20 MED ORDER — POTASSIUM CHLORIDE 10 MEQ/100ML IV SOLN
10.0000 meq | INTRAVENOUS | Status: AC
  Administered 2018-10-20: 16:00:00 10 meq via INTRAVENOUS
  Filled 2018-10-20: qty 100

## 2018-10-20 NOTE — Progress Notes (Signed)
PHARMACY NOTE -  Unasyn  Pharmacy has been assisting with dosing of Unasyn for IAI.  Dosage remains stable at 3 g IV q6 hr and need for further dosage adjustment appears unlikely at present given stable renal function at baseline  Pharmacy will sign off, following peripherally for culture results or dose adjustments. Please reconsult if a change in clinical status warrants re-evaluation of dosage.  Reuel Boom, PharmD, BCPS [phone] 10/20/2018, 6:48 PM

## 2018-10-20 NOTE — H&P (Addendum)
History and Physical  Jane Brooks ZJQ:[id number] DOB: 1970/05/09 DOA: 10/20/2018  PCP: Patient, No Pcp Per   Chief Complaint: Abdominal pain  HPI:  48 year old woman PMH pancreatic cancer, status post Whipple procedure and chemotherapy, currently undergoing radiation therapy PRESENTED to emergency department [facility] long for abdominal pain, vomiting, diarrhea.  Admitted for pancolitis.  Symptoms began approximately 3 days ago with increasing abdominal pain, lower, worsening nausea, vomiting and multiple episodes of diarrhea. She was seen in the emergency department at [facility] where CT showed diffuse wall thickening of the entire colon consistent with pancolitis.  She clinically improved and was discharged on ciprofloxacin and metronidazole.  Symptoms continued to progress with pain, vomiting and diarrhea, without specific aggravating or alleviating factors.  No associated symptoms noted.  No COVID symptoms  Chart review: . 10/4 emergency department visit [facility].  Abdominal pain.  CT showed diffuse wall thickening of the entire colon consistent with pancolitis. . 9/25 oncology office visit: Pancreatic adenocarcinoma status post chemotherapy and Whipple surgery, began radiation therapy 10/09/2018.  Recurrent abdominal pain with nausea and vomiting.  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  CT 8/5 showed diffuse colonic wall thickening and submucosal edema suggestive of colitis with findings similar to previous study.  CT abdomen pelvis 07/06/2018 showed diffuse wall thickening and mucosal enhancement of the colon compatible with colitis.  CT abdomen and pelvis 06/15/2018 again showed thickening of the ascending colon and hepatic flexure concerning for colitis  ED Course: Afebrile, vital signs stable, no hypoxia.  Treated with hydromorphone, Zofran  Review of Systems:  Negative for fever, visual changes, sore throat, rash, new muscle aches, chest pain, SOB, dysuria,  bleeding  Past Medical History:  Diagnosis Date  . Family history of lung cancer   . Family history of prostate cancer   . Family history of uterine cancer   . Gallstones 10/2017  . Pancreatic cancer ([place])   . Pneumothorax, closed, traumatic    years ago  . PONV (postoperative nausea and vomiting)     Past Surgical History:  Procedure Laterality Date  . BILIARY STENT PLACEMENT  10/31/2017   Procedure: BILIARY STENT PLACEMENT;  Surgeon: [name], MD;  Location: [facility] ENDOSCOPY;  Service: Gastroenterology;;  . ERCP N/A 10/31/2017   Procedure: ENDOSCOPIC RETROGRADE CHOLANGIOPANCREATOGRAPHY (ERCP);  Surgeon: [name], MD;  Location: [facility] ENDOSCOPY;  Service: Gastroenterology;  Laterality: N/A;  . ESOPHAGOGASTRODUODENOSCOPY N/A 11/08/2017   Procedure: ESOPHAGOGASTRODUODENOSCOPY (EGD);  Surgeon: Milus Banister, MD;  Location: [facility] ENDOSCOPY;  Service: Endoscopy;  Laterality: N/A;  . EUS N/A 11/08/2017   Procedure: UPPER ENDOSCOPIC ULTRASOUND (EUS) RADIAL;  Surgeon: Milus Banister, MD;  Location: [facility] ENDOSCOPY;  Service: Endoscopy;  Laterality: N/A;  . FINE NEEDLE ASPIRATION  11/08/2017   Procedure: FINE NEEDLE ASPIRATION;  Surgeon: Milus Banister, MD;  Location: [facility] ENDOSCOPY;  Service: Endoscopy;;  . IR IMAGING GUIDED PORT INSERTION  11/15/2017  . LAPAROSCOPY N/A 04/25/2018   Procedure: LAPAROSCOPY DIAGNOSTIC;  Surgeon: Stark Klein, MD;  Location: [place];  Service: General;  Laterality: N/A;  GENERAL AND EPIDURAL ANESTHESIA  . SPHINCTEROTOMY  10/31/2017   Procedure: SPHINCTEROTOMY;  Surgeon: [name], MD;  Location: [facility] ENDOSCOPY;  Service: Gastroenterology;;  . TONSILLECTOMY    . WHIPPLE PROCEDURE N/A 04/25/2018   Procedure: WHIPPLE PROCEDURE, RECONSTRUCTION OF PORTAL VEIN;  Surgeon: Stark Klein, MD;  Location: [place];  Service: General;  Laterality: N/A;     reports that she has been smoking cigarettes. She has a 37.00 pack-year smoking  history. She has never used smokeless  tobacco. She reports previous alcohol use. She reports current drug use. Drug: Marijuana. Mobility: Ambulatory  Allergies  Allergen Reactions  . Latex Hives    Family History  Problem Relation Age of Onset  . Diabetes Mother   . Hypertension Mother   . COPD Mother   . Uterine cancer Mother 95       had hysterectomy  . Diabetes Sister   . Hypertension Sister   . Lung cancer Maternal Grandmother        lung cancer  . Hypertension Sister      Prior to Admission medications   Medication Sig Start Date End Date Taking? Authorizing Provider  acetaminophen (TYLENOL) 325 MG tablet Take 1-2 tablets (325-650 mg total) by mouth every 4 (four) hours as needed for mild pain. 05/21/18   Love, Ivan Anchors, PA-C  cyclobenzaprine (FEXMID) 7.5 MG tablet Take 1 tablet (7.5 mg total) by mouth 3 (three) times daily as needed for muscle spasms. 10/11/18   Truitt Merle, MD  diphenoxylate-atropine (LOMOTIL) 2.5-0.025 MG tablet Take 1 to 2 tablets PO QID prn diarrhea 08/22/18   Tanner, Lyndon Code., PA-C  dronabinol (MARINOL) 2.5 MG capsule Take 1 capsule (2.5 mg total) by mouth 2 (two) times daily before lunch and supper. 09/24/18   Truitt Merle, MD  gabapentin (NEURONTIN) 300 MG capsule Take 1 capsule (300 mg total) by mouth 3 (three) times daily. 09/02/18   Truitt Merle, MD  lipase/protease/amylase (CREON) 36000 UNITS CPEP capsule Take 36,000 Units by mouth 3 (three) times daily before meals.    [provider]  metoCLOPramide (REGLAN) 5 MG tablet Take 1 tablet (5 mg total) by mouth every 6 (six) hours as needed for nausea or vomiting. 07/23/18   Alla Feeling, NP  morphine (MS CONTIN) 15 MG 12 hr tablet Take 1 tablet (15 mg total) by mouth daily. Plan to wean off in the next month 09/24/18   Truitt Merle, MD  morphine (MSIR) 15 MG tablet Take 1 tablet (15 mg total) by mouth every 6 (six) hours as needed for severe pain. 10/09/18   Alla Feeling, NP  ondansetron (ZOFRAN ODT) 8 MG disintegrating tablet Take 1 tablet (8 mg  total) by mouth every 8 (eight) hours as needed for nausea or vomiting. 10/11/18   Truitt Merle, MD  ondansetron (ZOFRAN) 8 MG tablet Take 1 tablet (8 mg total) by mouth 3 (three) times daily before meals. 10/11/18   Truitt Merle, MD  pantoprazole (PROTONIX) 40 MG tablet Take 1 tablet (40 mg total) by mouth daily. 08/20/18   Truitt Merle, MD  potassium chloride SA (K-DUR) 20 MEQ tablet Take 1 tablet (20 mEq total) by mouth daily. 10/09/18   Alla Feeling, NP  promethazine (PHENERGAN) 25 MG tablet Take 1 tablet (25 mg total) by mouth every 6 (six) hours as needed for nausea or vomiting. 06/19/18   Stark Klein, MD    Physical Exam: Vitals:   10/20/18 1512 10/20/18 1533  BP: 117/81 (!) 125/92  Pulse:  73  Resp:  16  Temp:  98.5 F (36.9 C)  SpO2:  99%    Constitutional:   . Appears calm, uncomfortable, nontoxic Eyes:  . pupils and irises appear normal . Normal lids  ENMT:  . grossly normal hearing  . Lips appear normal Neck:  . neck appears normal, no masses . no thyromegaly Respiratory:  . CTA bilaterally, no w/r/r.  . Respiratory effort normal.  Cardiovascular:  .  RRR, no m/r/g . No LE extremity edema   Abdomen:  . Well-healed surgical incision.  Thin.  Soft.  Mild generalized tenderness.  Nondistended. Musculoskeletal:  . Digits/nails BUE: no clubbing, cyanosis, petechiae, infection . RUE, LUE o strength and tone normal, no atrophy, no abnormal movements Skin:  . No rashes, lesions, ulcers . palpation of skin: no induration or nodules Psychiatric:  . Mental status o Mood, affect appropriate . judgment and insight appear intact   I have personally reviewed following labs and imaging studies  Labs:  SARS-CoV-2 pending Potassium 2.9, remainder BMP unremarkable LFTs unremarkable Lactic acid 1.1 Hemoglobin 9.8, WBC 5.6, platelets 196 Blood culture pending  Imaging studies:   As described above   Principal Problem:   Pancolitis ([place]) Active Problems:   Pancreatic  adenocarcinoma ([facility])   Hypokalemia   Microcytic anemia   Assessment/Plan Pancolitis with generalized abdominal pain, nausea, vomiting and diarrhea. --Quite symptomatic.  Pain control.  Clear liquids for now.  Will need to prove can tolerate diet.  IV fluids. --Multiple previous CTs demonstrate colitis or pancolitis.  Not clear that this is infectious.  I doubt C. difficile.  Discuss with oncology 10/5.  Pancreatic adenocarcinoma status post chemotherapy, status post Whipple, began radiation therapy 9/23 --Follow-up with oncology as an outpatient --Notify radiation oncology patient 10/5 is admitted  Hypokalemia --Replete.  Check magnesium.  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  Cigarette smoker --nicotine patch   Severity of Illness: The appropriate patient status for this patient is INPATIENT. Inpatient status is judged to be reasonable and necessary in order to provide the required intensity of service to ensure the patient's safety. The patient's presenting symptoms, physical exam findings, and initial radiographic and laboratory data in the context of their chronic comorbidities is felt to place them at high risk for further clinical deterioration. Furthermore, it is not anticipated that the patient will be medically stable for discharge from the hospital within 2 midnights of admission. The following factors support the patient status of inpatient.   " The patient's presenting symptoms include abdominal pain, vomiting, diarrhea. " The worrisome physical exam findings include mild generalized abdominal tenderness. " The initial radiographic and laboratory data are worrisome because of pancolitis, hypokalemia. " The chronic co-morbidities include pancreatic adenocarcinoma   * I certify that at the point of admission it is my clinical judgment that the patient will require inpatient hospital care spanning beyond 2 midnights from the point of  admission due to high intensity of service, high risk for further deterioration and high frequency of surveillance required.*   DVT prophylaxis:SCDs Code Status: Full Family Communication: none Consults called: none    Time spent: 60 minutes  Murray Hodgkins, MD  [facility] Direct contact: see [email or web address]  7PM-7AM contact night coverage as below   1. Check the care team in [facility] and look for a) attending/consulting [facility] provider listed and b) the [facility] team listed 2. Log into [email or web address] and use [place]'s universal password to access. If you do not have the password, please contact the hospital operator. 3. Locate the [facility] provider you are looking for under [facility] and page to a number that you can be directly reached. 4. If you still have difficulty reaching the provider, please page the [facility] (Director on Call) for the Hospitalists listed on amion for assistance.   10/20/2018, 4:48 PM

## 2018-10-20 NOTE — ED Provider Notes (Signed)
[facility] Emergency Department Provider Note MRN:  [id number]  Arrival date & time: 10/20/18     Chief Complaint   Abdominal Pain   History of Present Illness   Jane Brooks is a 48 y.o. year-old female with a history of pancreatic cancer status post [place] presenting to the ED with chief complaint of abdominal pain.  3 days of gradual onset constant abdominal pain located diffusely in the abdomen.  Was seen [place] emergency department last night, CT scan revealing colitis, they are able to control her symptoms and discharge her on antibiotics and pain control.  Here today because pain is worsening, nausea is worsening, unable to tolerate her medications at home.  Denies fever, no chest pain or shortness of breath.  Watery diarrhea.  Course antibiotics last month.  Review of Systems  A complete 10 system review of systems was obtained and all systems are negative except as noted in the HPI and PMH.   Patient's Health History    Past Medical History:  Diagnosis Date  . Family history of lung cancer   . Family history of prostate cancer   . Family history of uterine cancer   . Gallstones 10/2017  . Pancreatic cancer ([place])   . Pneumothorax, closed, traumatic    years ago  . PONV (postoperative nausea and vomiting)     Past Surgical History:  Procedure Laterality Date  . BILIARY STENT PLACEMENT  10/31/2017   Procedure: BILIARY STENT PLACEMENT;  Surgeon: [name], MD;  Location: [facility] ENDOSCOPY;  Service: Gastroenterology;;  . ERCP N/A 10/31/2017   Procedure: ENDOSCOPIC RETROGRADE CHOLANGIOPANCREATOGRAPHY (ERCP);  Surgeon: [name], MD;  Location: [facility] ENDOSCOPY;  Service: Gastroenterology;  Laterality: N/A;  . ESOPHAGOGASTRODUODENOSCOPY N/A 11/08/2017   Procedure: ESOPHAGOGASTRODUODENOSCOPY (EGD);  Surgeon: Milus Banister, MD;  Location: [facility] ENDOSCOPY;  Service: Endoscopy;  Laterality: N/A;  . EUS N/A 11/08/2017   Procedure:  UPPER ENDOSCOPIC ULTRASOUND (EUS) RADIAL;  Surgeon: Milus Banister, MD;  Location: [facility] ENDOSCOPY;  Service: Endoscopy;  Laterality: N/A;  . FINE NEEDLE ASPIRATION  11/08/2017   Procedure: FINE NEEDLE ASPIRATION;  Surgeon: Milus Banister, MD;  Location: [facility] ENDOSCOPY;  Service: Endoscopy;;  . IR IMAGING GUIDED PORT INSERTION  11/15/2017  . LAPAROSCOPY N/A 04/25/2018   Procedure: LAPAROSCOPY DIAGNOSTIC;  Surgeon: Stark Klein, MD;  Location: [place];  Service: General;  Laterality: N/A;  GENERAL AND EPIDURAL ANESTHESIA  . SPHINCTEROTOMY  10/31/2017   Procedure: SPHINCTEROTOMY;  Surgeon: [name], MD;  Location: [facility] ENDOSCOPY;  Service: Gastroenterology;;  . TONSILLECTOMY    . WHIPPLE PROCEDURE N/A 04/25/2018   Procedure: WHIPPLE PROCEDURE, RECONSTRUCTION OF PORTAL VEIN;  Surgeon: Stark Klein, MD;  Location: [place];  Service: General;  Laterality: N/A;    Family History  Problem Relation Age of Onset  . Diabetes Mother   . Hypertension Mother   . COPD Mother   . Uterine cancer Mother 45       had hysterectomy  . Diabetes Sister   . Hypertension Sister   . Lung cancer Maternal Grandmother        lung cancer  . Hypertension Sister     Social History   Socioeconomic History  . Marital status: Single    Spouse name: Not on file  . Number of children: Not on file  . Years of education: Not on file  . Highest education level: Not on file  Occupational History  . Occupation: unemployed  Social Needs  .  Financial resource strain: Not on file  . Food insecurity    Worry: Not on file    Inability: Not on file  . Transportation needs    Medical: No    Non-medical: No  Tobacco Use  . Smoking status: Current Every Day Smoker    Packs/day: 1.00    Years: 37.00    Pack years: 37.00    Types: Cigarettes  . Smokeless tobacco: Never Used  . Tobacco comment: Started back smoking  Substance and Sexual Activity  . Alcohol use: Not Currently  . Drug use: Never  . Sexual activity: Not  Currently  Lifestyle  . Physical activity    Days per week: Not on file    Minutes per session: Not on file  . Stress: Not on file  Relationships  . Social Herbalist on phone: Not on file    Gets together: Not on file    Attends religious service: Not on file    Active member of club or organization: Not on file    Attends meetings of clubs or organizations: Not on file    Relationship status: Not on file  . Intimate partner violence    Fear of current or ex partner: Not on file    Emotionally abused: Not on file    Physically abused: Not on file    Forced sexual activity: Not on file  Other Topics Concern  . Not on file  Social History Narrative  . Not on file     Physical Exam  Vital Signs and Nursing Notes reviewed Vitals:   10/20/18 1158 10/20/18 1259  BP: (!) 131/97 (!) 133/97  Pulse: (!) 115 70  Resp: 16 18  Temp: 98.8 F (37.1 C)   SpO2: 99% 100%    CONSTITUTIONAL: Chronically ill-appearing, NAD NEURO:  Alert and oriented x 3, no focal deficits EYES:  eyes equal and reactive ENT/NECK:  no LAD, no JVD CARDIO: Regular rate, well-perfused, normal S1 and S2 PULM:  CTAB no wheezing or rhonchi GI/GU:  normal bowel sounds, non-distended, moderate diffuse abdominal tenderness MSK/SPINE:  No gross deformities, no edema SKIN:  no rash, atraumatic PSYCH:  Appropriate speech and behavior  Diagnostic and Interventional Summary    Labs Reviewed  CBC - Abnormal; Notable for the following components:      Result Value   Hemoglobin 9.8 (*)    HCT 30.4 (*)    MCV 63.2 (*)    MCH 20.4 (*)    RDW 18.8 (*)    nRBC 0.4 (*)    All other components within normal limits  COMPREHENSIVE METABOLIC PANEL - Abnormal; Notable for the following components:   Potassium 2.9 (*)    Creatinine, Ser 0.40 (*)    Calcium 8.5 (*)    Total Protein 5.9 (*)    Albumin 3.1 (*)    All other components within normal limits  CULTURE, BLOOD (SINGLE)  GI PATHOGEN PANEL BY PCR,  STOOL  C DIFFICILE QUICK SCREEN W PCR REFLEX  SARS CORONAVIRUS 2 (TAT 6-24 HRS)  LACTIC ACID, PLASMA    No orders to display    Medications  metroNIDAZOLE (FLAGYL) IVPB 500 mg (has no administration in time range)  HYDROmorphone (DILAUDID) injection 0.5 mg (has no administration in time range)  potassium chloride 10 mEq in 100 mL IVPB (has no administration in time range)  sodium chloride 0.9 % bolus 1,000 mL (1,000 mLs Intravenous Bolus from Bag 10/20/18 1243)  ondansetron (ZOFRAN) injection  4 mg (4 mg Intravenous Given 10/20/18 1245)  HYDROmorphone (DILAUDID) injection 1 mg (1 mg Intravenous Given 10/20/18 1242)  sodium chloride 0.9 % bolus 1,000 mL (1,000 mLs Intravenous Bolus from Bag 10/20/18 1243)  ciprofloxacin (CIPRO) IVPB 400 mg (400 mg Intravenous New Bag/Given (Non-Interop) 10/20/18 1247)  HYDROmorphone (DILAUDID) injection 0.5 mg (0.5 mg Intravenous Given 10/20/18 1322)     Procedures Critical Care  ED Course and Medical Decision Making  I have reviewed the triage vital signs and the nursing notes.  Pertinent labs & imaging results that were available during my care of the patient were reviewed by me and considered in my medical decision making (see below for details).  Patient has known pancolitis as seen on CT scan from last night.  She is continued tenderness, no signs of peritonitis on my exam.  Vitals largely reassuring, mildly tachycardic, afebrile.  We will provide symptomatic management, repeat laboratory assessment, may need admission for IV antibiotics and continued symptomatic control.  Will test for GI pathogens including C. difficile.  Tachycardia improving with fluids.  Patient continues to require opioid pain medicine, continued emesis.  Labs reveal hypokalemia.  Will admit to hospitalist service to ensure treatment and improvement of patient's pancolitis.  Barth Kirks. Sedonia Small, [place] [email or web address]   Final Clinical Impressions(s) / ED Diagnoses     ICD-10-CM   1. Pancolitis ([place])  K51.00   2. Diarrhea of presumed infectious origin  R19.7   3. Hypokalemia  E87.6   4. Generalized abdominal pain  R10.84     ED Discharge Orders    None      Discharge Instructions Discussed with and Provided to Patient: Discharge Instructions   None       Maudie Flakes, MD 10/20/18 1349

## 2018-10-20 NOTE — ED Triage Notes (Signed)
Pt arrives today after being seen at [facility] earlier. Pt states she was dx with colitis. Pt states that she has been unable to keep anything down, and not able to take medications prescribed. Pt states that she had a BM on herself earlier today, as she could not make it to the restroom in time.

## 2018-10-21 ENCOUNTER — Other Ambulatory Visit: Payer: Self-pay

## 2018-10-21 ENCOUNTER — Ambulatory Visit
Admission: RE | Admit: 2018-10-21 | Discharge: 2018-10-21 | Disposition: A | Discharge status: Home / Self Care | Payer: Medicaid Other | Source: Ambulatory Visit | Attending: Radiation Oncology | Admitting: Radiation Oncology

## 2018-10-21 DIAGNOSIS — K5100 Ulcerative (chronic) pancolitis without complications: Secondary | ICD-10-CM

## 2018-10-21 LAB — COMPREHENSIVE METABOLIC PANEL
ALT: 20 U/L (ref 0–44)
AST: 18 U/L (ref 15–41)
Albumin: 2.5 g/dL — ABNORMAL LOW (ref 3.5–5.0)
Alkaline Phosphatase: 71 U/L (ref 38–126)
Anion gap: 4 — ABNORMAL LOW (ref 5–15)
BUN: <5 mg/dL — ABNORMAL LOW (ref 6–20)
CO2: 24 mmol/L (ref 22–32)
Calcium: 7.9 mg/dL — ABNORMAL LOW (ref 8.9–10.3)
Chloride: 109 mmol/L (ref 98–111)
Creatinine, Ser: 0.43 mg/dL — ABNORMAL LOW (ref 0.44–1.00)
GFR calc Af Amer: >60 mL/min (ref >60)
GFR calc non Af Amer: >60 mL/min (ref >60)
Glucose, Bld: 77 mg/dL (ref 70–99)
Potassium: 3.2 mmol/L — ABNORMAL LOW (ref 3.5–5.1)
Sodium: 137 mmol/L (ref 135–145)
Total Bilirubin: 0.6 mg/dL (ref 0.3–1.2)
Total Protein: 4.8 g/dL — ABNORMAL LOW (ref 6.5–8.1)

## 2018-10-21 LAB — C DIFFICILE QUICK SCREEN W PCR REFLEX
C Diff antigen: NEGATIVE
C Diff interpretation: NOT DETECTED
C Diff toxin: NEGATIVE

## 2018-10-21 LAB — CBC
HCT: 27.3 % — ABNORMAL LOW (ref 36.0–46.0)
Hemoglobin: 8.7 g/dL — ABNORMAL LOW (ref 12.0–15.0)
MCH: 20.3 pg — ABNORMAL LOW (ref 26.0–34.0)
MCHC: 31.9 g/dL (ref 30.0–36.0)
MCV: 63.6 fL — ABNORMAL LOW (ref 80.0–100.0)
Platelets: 159 10*3/uL (ref 150–400)
RBC: 4.29 MIL/uL (ref 3.87–5.11)
RDW: 18.6 % — ABNORMAL HIGH (ref 11.5–15.5)
WBC: 3.6 10*3/uL — ABNORMAL LOW (ref 4.0–10.5)
nRBC: 0 % (ref 0.0–0.2)

## 2018-10-21 LAB — MAGNESIUM: Magnesium: 1.6 mg/dL — ABNORMAL LOW (ref 1.7–2.4)

## 2018-10-21 LAB — SARS CORONAVIRUS 2 (TAT 6-24 HRS): SARS Coronavirus 2: NEGATIVE

## 2018-10-21 MED ORDER — HEPARIN SOD (PORK) LOCK FLUSH 100 UNIT/ML IV SOLN
500.0000 [IU] | INTRAVENOUS | Status: AC | PRN
  Administered 2018-10-21: 15:00:00 500 [IU]
  Filled 2018-10-21: qty 5

## 2018-10-21 MED ORDER — POTASSIUM CHLORIDE CRYS ER 20 MEQ PO TBCR
40.0000 meq | EXTENDED_RELEASE_TABLET | Freq: Every day | ORAL | Status: DC
  Administered 2018-10-21: 09:00:00 40 meq via ORAL
  Filled 2018-10-21: qty 2

## 2018-10-21 MED ORDER — MAGNESIUM OXIDE 400 (241.3 MG) MG PO TABS: 400.0000 mg | ORAL_TABLET | Freq: Two times a day (BID) | ORAL | 0 refills | Status: DC

## 2018-10-21 MED ORDER — MAGNESIUM OXIDE 400 (241.3 MG) MG PO TABS
400.0000 mg | ORAL_TABLET | Freq: Two times a day (BID) | ORAL | Status: DC
  Administered 2018-10-21: 400 mg via ORAL
  Filled 2018-10-21: qty 1

## 2018-10-21 MED ORDER — POTASSIUM CHLORIDE CRYS ER 20 MEQ PO TBCR: 20.0000 meq | EXTENDED_RELEASE_TABLET | Freq: Two times a day (BID) | ORAL | 0 refills | Status: DC

## 2018-10-21 MED ORDER — SODIUM CHLORIDE 0.9% FLUSH: 10.0000 mL | Freq: Two times a day (BID) | INTRAVENOUS | Status: DC

## 2018-10-21 NOTE — Progress Notes (Signed)
Patient discharged to home and transported to exit via wheelchair. Instructions reviewed and all questions answered. No needs expressed.

## 2018-10-21 NOTE — Discharge Summary (Signed)
Physician Discharge Summary  Talayah Picardi HYI:[id number] DOB: July 22, 1970 DOA: 10/20/2018  PCP: Patient, No Pcp Per  Admit date: 10/20/2018 Discharge date: 10/21/2018  Admitted From: home Disposition:  home  Recommendations for Outpatient Follow-up:  1. Follow up with PCP in 1-2 weeks and with gastroenterology to discuss about colonoscopy 2. Please obtain BMP/CBC in one week 3. Please follow up on the following pending results:  Home Health:no  Equipment/Devices:none  Discharge Condition: Stable CODE STATUS: FULL Diet recommendation: Heart Healthy  Brief/Interim Summary:  As per H&P : 48 year old woman PMH pancreatic cancer, status post Whipple procedure and chemotherapy, currently undergoing radiation therapy PRESENTED to emergency department [facility] for abdominal pain, vomiting, diarrhea.  Admitted for pancolitis. Symptoms began approximately 3 days ago with increasing abdominal pain, lower, worsening nausea, vomiting and multiple episodes of diarrhea. She was seen in the emergency department at [facility] where CT showed diffuse wall thickening of the entire colon consistent with pancolitis.  She clinically improved and was discharged on ciprofloxacin and metronidazole.  Symptoms continued to progress with pain, vomiting and diarrhea, without specific aggravating or alleviating factors.  No associated symptoms noted.  No COVID symptoms Chart review:  10/4 emergency department visit [facility].  Abdominal pain.  CT showed diffuse wall thickening of the entire colon consistent with pancolitis.  9/25 oncology office visit: Pancreatic adenocarcinoma status post chemotherapy and Whipple surgery, began radiation therapy 10/09/2018.  Recurrent abdominal pain with nausea and vomiting.  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  CT 8/5 showed diffuse colonic wall thickening and submucosal edema suggestive of colitis with findings similar to previous study.  CT abdomen  pelvis 07/06/2018 showed diffuse wall thickening and mucosal enhancement of the colon compatible with colitis.  CT abdomen and pelvis 06/15/2018 again showed thickening of the ascending colon and hepatic flexure concerning for colitis  ED Course: Afebrile, vital signs stable, no hypoxia.  Treated with hydromorphone, Zofran Patient is admitted overnight for symptomatic management hydration.  Seen this morning, diet advanced to soft diet which she tolerated well.  She had a bowel movement but it was not loose or diarrhea, stool studies were obtained negative for C. difficile GI panel pending Since patient has had multiple CT scan at least 3 showing evidence of pancolitis I discussed with gastroenterologist who kindly saw the patient and advised outpatient follow-up possible colonoscopy.  Patient feels well and she would like to go home today and requesting to get discharged early as she has a dog to take care of at home. At this time no further need for inpatient hospitalization. Will stop further IV antibiotics as per GI recommendation suspecting most likely chemoradiation induced colitis.  Again GI panel is pending at this time.  Patient is afebrile and no leukocytosis less likely infectious given she has had similar colitis as evident by her CT scan going back up to may. She reports she has sublingual dissolvable Zofran and will continue taking that at home prn  Discussed with Paulina from gastroenterology team: Who agrees for discharge today and they will follow-up on patient's pending GI panel.  As per her Gi- they did offer her colonoscopy in-house however patient wanting to go home today and  GI will arrange for outpatient colonoscopy and further plan/testing. , Discharge Diagnoses:  Principal Problem:  Pancolitis:Symptoms resolved.  Tolerating diet no nausea vomiting fever chills abdominal pain or diarrhea.  With GI, going home today. Active Problems: Pancreatic adenocarcinoma, status post  Whipple, on XRT since 9/23.hematology oncology saw her while  here,  and she will continue to follow-up with as outpatient.  Hypokalemia: Replaced here-she is on potassium supplementation and in increased to 20 mck bid at home- seh will need to f/u PCP to monitor. Microcytic anemia. Stable Hypomagnesemia replaced, will prescribe few days of magnesium oxide.  DISPO: Home.  Discharge Instructions  Discharge Instructions    Call MD for:  persistant nausea and vomiting   Complete by: As directed    Call MD for:  severe uncontrolled pain   Complete by: As directed    Call MD for:  temperature >100.4   Complete by: As directed    Diet - low sodium heart healthy   Complete by: As directed    Discharge instructions   Complete by: As directed    Please call call MD or return to ER for similar or worsening recurring problem that brought you to hospital or if any fever,nausea/vomiting,abdominal pain, uncontrolled pain, chest pain,  shortness of breath or any other alarming symptoms.  Please follow up with your gastroenterology for colonoscopy as advised and also for the results of your stool studies  Please follow-up your doctor as instructed in a week time and call the office for appointment.  Please avoid alcohol, smoking, or any other illicit substance and maintain healthy habits including taking your regular medications as prescribed.  You were cared for by a hospitalist during your hospital stay. If you have any questions about your discharge medications or the care you received while you were in the hospital after you are discharged, you can call the unit and ask to speak with the hospitalist on call if the hospitalist that took care of you is not available.  Once you are discharged, your primary care physician will handle any further medical issues. Please note that NO REFILLS for any discharge medications will be authorized once you are discharged, as it is imperative that you return to  your primary care physician (or establish a relationship with a primary care physician if you do not have one) for your aftercare needs so that they can reassess your need for medications and monitor your lab values   Increase activity slowly   Complete by: As directed      Allergies as of 10/21/2018      Reactions   Latex Hives      Medication List    TAKE these medications   acetaminophen 325 MG tablet Commonly known as: TYLENOL Take 1-2 tablets (325-650 mg total) by mouth every 4 (four) hours as needed for mild pain.   Creon 36000 UNITS Cpep capsule Generic drug: lipase/protease/amylase Take 36,000 Units by mouth 3 (three) times daily before meals.   cyclobenzaprine 7.5 MG tablet Commonly known as: FEXMID Take 1 tablet (7.5 mg total) by mouth 3 (three) times daily as needed for muscle spasms.   diphenoxylate-atropine 2.5-0.025 MG tablet Commonly known as: LOMOTIL Take 1 to 2 tablets PO QID prn diarrhea   dronabinol 2.5 MG capsule Commonly known as: MARINOL Take 1 capsule (2.5 mg total) by mouth 2 (two) times daily before lunch and supper.   gabapentin 300 MG capsule Commonly known as: NEURONTIN Take 1 capsule (300 mg total) by mouth 3 (three) times daily.   magnesium oxide 400 (241.3 Mg) MG tablet Commonly known as: MAG-OX Take 1 tablet (400 mg total) by mouth 2 (two) times daily for 4 doses.   metoCLOPramide 5 MG tablet Commonly known as: REGLAN Take 1 tablet (5 mg total) by mouth every 6 (  six) hours as needed for nausea or vomiting.   morphine 15 MG 12 hr tablet Commonly known as: MS CONTIN Take 1 tablet (15 mg total) by mouth daily. Plan to wean off in the next month   morphine 15 MG tablet Commonly known as: MSIR Take 1 tablet (15 mg total) by mouth every 6 (six) hours as needed for severe pain.   ondansetron 8 MG disintegrating tablet Commonly known as: Zofran ODT Take 1 tablet (8 mg total) by mouth every 8 (eight) hours as needed for nausea or  vomiting.   ondansetron 8 MG tablet Commonly known as: ZOFRAN Take 1 tablet (8 mg total) by mouth 3 (three) times daily before meals.   pantoprazole 40 MG tablet Commonly known as: PROTONIX Take 1 tablet (40 mg total) by mouth daily.   potassium chloride SA 20 MEQ tablet Commonly known as: KLOR-CON Take 1 tablet (20 mEq total) by mouth 2 (two) times daily for 5 days. What changed: when to take this   promethazine 25 MG tablet Commonly known as: PHENERGAN Take 1 tablet (25 mg total) by mouth every 6 (six) hours as needed for nausea or vomiting.      Follow-up Information    Th[place]ton [place], MD Follow up in 2 week(s).   Specialty: Gastroenterology Contact information: [place] [id number] [phone]          Allergies  Allergen Reactions  . Latex Hives    Consultations:  GI  Procedures/Studies: No results found.   Subjective: Resting well tolerated diet no nausea vomiting or abdominal pain this morning.  Discharge Exam: Vitals:   10/21/18 0543 10/21/18 1348  BP: (!) 141/98 (!) 143/93  Pulse: 73 78  Resp: 19 16  Temp: 98.7 F (37.1 C) 98.2 F (36.8 C)  SpO2: 100% 100%   Vitals:   10/20/18 1533 10/20/18 2031 10/21/18 0543 10/21/18 1348  BP: (!) 125/92 (!) 146/99 (!) 141/98 (!) 143/93  Pulse: 73 67 73 78  Resp: 16 17 19 16   Temp: 98.5 F (36.9 C) 97.8 F (36.6 C) 98.7 F (37.1 C) 98.2 F (36.8 C)  TempSrc: Oral Oral Oral Oral  SpO2: 99% 100% 100% 100%  Weight:      Height:        General: Pt is alert, awake, not in acute distress Cardiovascular: RRR, S1/S2 +, no rubs, no gallops Respiratory: CTA bilaterally, no wheezing, no rhonchi Abdominal: Soft, NT, ND, bowel sounds + Extremities: no edema, no cyanosis   The results of significant diagnostics from this hospitalization (including imaging, microbiology, ancillary and laboratory) are listed below for reference.     Microbiology: Recent Results (from the past 240  hour(s))  Culture, blood (single) w Reflex to ID Panel     Status: None (Preliminary result)   Collection Time: 10/20/18 12:57 PM   Specimen: BLOOD  Result Value Ref Range Status   Specimen Description   Final    BLOOD PORTA CATH Performed at [facility] [address], [place], [place] [address]    Special Requests   Final    BOTTLES DRAWN AEROBIC AND ANAEROBIC Blood Culture adequate volume Performed at [facility] [address], [place], [place] [address]    Culture   Final    NO GROWTH < 24 HOURS Performed at [facility] [address] [place], [place] [address]    Report Status PENDING  Incomplete  SARS CORONAVIRUS 2 (TAT 6-24 HRS) Nasopharyngeal  Nasopharyngeal Swab     Status: None   Collection Time: 10/20/18  2:53 PM   Specimen: Nasopharyngeal Swab  Result Value Ref Range Status   SARS Coronavirus 2 NEGATIVE NEGATIVE Final    Comment: (NOTE) SARS-CoV-2 target nucleic acids are NOT DETECTED. The SARS-CoV-2 RNA is generally detectable in upper and lower respiratory specimens during the acute phase of infection. Negative results do not preclude SARS-CoV-2 infection, do not rule out co-infections with other pathogens, and should not be used as the sole basis for treatment or other patient management decisions. Negative results must be combined with clinical observations, patient history, and epidemiological information. The expected result is Negative. Fact Sheet for Patients: SugarRoll.be Fact Sheet for Healthcare Providers: https://www.woods-mathews.com/ This test is not yet approved or cleared by the [place] FDA and  has been authorized for detection and/or diagnosis of SARS-CoV-2 by FDA under an Emergency Use Authorization (EUA). This EUA will remain  in effect (meaning this test can be used) for the duration of the COVID-19 declaration under Section 56 4(b)(1) of the Act,  21 U.S.C. section 360bbb-3(b)(1), unless the authorization is terminated or revoked sooner. Performed at [facility], [place] [address], [place], [place] [address]   C Difficile Quick Screen w PCR reflex     Status: None   Collection Time: 10/21/18 12:39 PM   Specimen: Stool  Result Value Ref Range Status   C Diff antigen NEGATIVE NEGATIVE Final   C Diff toxin NEGATIVE NEGATIVE Final   C Diff interpretation No C. difficile detected.  Final    Comment: Performed at [facility], [place] [address], [place], [place] [address]     Labs: BNP (last 3 results) No results for input(s): BNP in the last 8760 hours. Basic Metabolic Panel: Recent Labs  Lab 10/16/18 0848 10/20/18 1257 10/21/18 0420  NA 139 135 137  K 4.1 2.9* 3.2*  CL 102 105 109  CO2 27 22 24   GLUCOSE 70 88 77  BUN 7 7 <5*  CREATININE 0.57 0.40* 0.43*  CALCIUM 8.4* 8.5* 7.9*  MG  --   --  1.6*   Liver Function Tests: Recent Labs  Lab 10/16/18 0848 10/20/18 1257 10/21/18 0420  AST 40 26 18  ALT 28 24 20   ALKPHOS 122 91 71  BILITOT 0.7 0.8 0.6  PROT 6.4* 5.9* 4.8*  ALBUMIN 3.3* 3.1* 2.5*   No results for input(s): LIPASE, AMYLASE in the last 168 hours. No results for input(s): AMMONIA in the last 168 hours. CBC: Recent Labs  Lab 10/16/18 0848 10/20/18 1257 10/21/18 0420  WBC 6.0 5.6 3.6*  NEUTROABS 4.7  --   --   HGB 11.2* 9.8* 8.7*  HCT 34.5* 30.4* 27.3*  MCV 62.3* 63.2* 63.6*  PLT 280 196 159   Cardiac Enzymes: No results for input(s): CKTOTAL, CKMB, CKMBINDEX, TROPONINI in the last 168 hours. BNP: Invalid input(s): POCBNP CBG: No results for input(s): GLUCAP in the last 168 hours. D-Dimer No results for input(s): DDIMER in the last 72 hours. Hgb A1c No results for input(s): HGBA1C in the last 72 hours. Lipid Profile No results for input(s): CHOL, HDL, LDLCALC, TRIG, CHOLHDL, LDLDIRECT in the last 72 hours. Thyroid function studies No results for input(s): TSH,  T4TOTAL, T3FREE, THYROIDAB in the last 72 hours.  Invalid input(s): FREET3 Anemia work up No results for input(s): VITAMINB12, FOLATE, FERRITIN, TIBC, IRON, RETICCTPCT in the last 72 hours. Urinalysis    Component Value Date/Time  COLORURINE YELLOW 09/23/2018 0251   APPEARANCEUR HAZY (A) 09/23/2018 0251   LABSPEC 1.018 09/23/2018 0251   PHURINE 5.0 09/23/2018 0251   GLUCOSEU NEGATIVE 09/23/2018 0251   HGBUR NEGATIVE 09/23/2018 0251   BILIRUBINUR NEGATIVE 09/23/2018 0251   KETONESUR NEGATIVE 09/23/2018 0251   PROTEINUR NEGATIVE 09/23/2018 0251   NITRITE NEGATIVE 09/23/2018 0251   LEUKOCYTESUR NEGATIVE 09/23/2018 0251   Sepsis Labs Invalid input(s): PROCALCITONIN,  WBC,  LACTICIDVEN Microbiology Recent Results (from the past 240 hour(s))  Culture, blood (single) w Reflex to ID Panel     Status: None (Preliminary result)   Collection Time: 10/20/18 12:57 PM   Specimen: BLOOD  Result Value Ref Range Status   Specimen Description   Final    BLOOD PORTA CATH Performed at [facility], [place] [address] [place], [place] [address]    Special Requests   Final    BOTTLES DRAWN AEROBIC AND ANAEROBIC Blood Culture adequate volume Performed at [facility] [address], [place], [place] [address]    Culture   Final    NO GROWTH < 24 HOURS Performed at [facility] [address] [place], [place] [address]    Report Status PENDING  Incomplete  SARS CORONAVIRUS 2 (TAT 6-24 HRS) Nasopharyngeal Nasopharyngeal Swab     Status: None   Collection Time: 10/20/18  2:53 PM   Specimen: Nasopharyngeal Swab  Result Value Ref Range Status   SARS Coronavirus 2 NEGATIVE NEGATIVE Final    Comment: (NOTE) SARS-CoV-2 target nucleic acids are NOT DETECTED. The SARS-CoV-2 RNA is generally detectable in upper and lower respiratory specimens during the acute phase of infection. Negative results do not preclude SARS-CoV-2 infection, do not rule  out co-infections with other pathogens, and should not be used as the sole basis for treatment or other patient management decisions. Negative results must be combined with clinical observations, patient history, and epidemiological information. The expected result is Negative. Fact Sheet for Patients: SugarRoll.be Fact Sheet for Healthcare Providers: https://www.woods-mathews.com/ This test is not yet approved or cleared by the [place] FDA and  has been authorized for detection and/or diagnosis of SARS-CoV-2 by FDA under an Emergency Use Authorization (EUA). This EUA will remain  in effect (meaning this test can be used) for the duration of the COVID-19 declaration under Section 56 4(b)(1) of the Act, 21 U.S.C. section 360bbb-3(b)(1), unless the authorization is terminated or revoked sooner. Performed at [facility], [place] [address] [place], [place] [address]   C Difficile Quick Screen w PCR reflex     Status: None   Collection Time: 10/21/18 12:39 PM   Specimen: Stool  Result Value Ref Range Status   C Diff antigen NEGATIVE NEGATIVE Final   C Diff toxin NEGATIVE NEGATIVE Final   C Diff interpretation No C. difficile detected.  Final    Comment: Performed at [facility], [place] [address] [place], [place] [address]     Time coordinating discharge:  25 minutes  SIGNED:   Antonieta Pert, MD  [facility] 10/21/2018, 2:18 PM  If 7PM-7AM, please contact night-coverage [email or web address]

## 2018-10-21 NOTE — Consult Note (Signed)
Referring Provider:   [facility]        Primary Care Physician:  Patient, No Pcp Per Primary Gastroenterologist:   Zenovia Jarred (October 2019 admission)      Reason for Consultation:  Recurrent colitis                 ASSESSMENT /  PLAN    33. 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 Because of positive margins on path ( vein ) she started Radiation treatments last week.   2. Episodic nausea, vomiting, generalized mid abdominal pain in setting of colitis on three CT scans since May 2020. Treated twice empirically with Augmentin. Unclear why she is having these episodic symptoms feeling relatively well in between.  -I think she may need colonoscopy in near future. She is already feeling better and may be going home today. I can arrange for close outpatient follow up.  -Would suggest phenergan suppositories to use as needed. This may keep her out of the ED because once vomiting starts it is difficult for her to tolerate PO anti-emetic.   3. Microcytic anemia. Hgb 8.7, down from baseline of around 11. Anemia probably multifactorial. Can evaluate further as outpatient.    HPI:     Jane Brooks is a 48 y.o. 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 Tumor was borderline resectable so she underwent neoadjuvant chemotherapy. Ultimately underwent Whipple in April 2020. Surgical margins (vein) were positive and she had one positive node. PET scan 07/25/18 showed suspicious liver metastasis. Liver biopsy 08/13/18 negative for cancer cells. Because of the positive margins patient was referred for Radiation and she started it last week.  Since May 2020 Jane Brooks has been to ED at least three times with abdominal pain, nausea, vomiting,and diarrhea. CT AP in late May suggested right sided colitis. Repeat in June (ED) suggested diffuse  colitis, treated with Augmentin. Repeat CT AP during early August ED visit also suggested diffuse colitis and she was again treated with Augmentin. Jane Brooks says she generally feels okay in between these episodes. Her BMs will be normal, no significant nausea / vomiting. Then, for unclear reasons she will developed mid abdominal pain which is quickly followed by nausea / vomiting. The diarrhea is more sporadic.   Patient presented to [facility] ED yesterday after having been seen at [facility] earlier in the day. She couldn't keep anything down. Now feeling better, diet being advanced. Jane Brooks says she was completely healthy before cancer diagnosis. She had no chronic GI issues. No [place] of GI diseases \ Past Medical History:  Diagnosis Date  . Family history of lung cancer   . Family history of prostate cancer   . Family history of uterine cancer   . Gallstones 10/2017  . Pancreatic cancer ([place])   . Pneumothorax, closed, traumatic    years ago  . PONV (postoperative nausea and vomiting)     Past Surgical History:  Procedure Laterality Date  . BILIARY STENT PLACEMENT  10/31/2017   Procedure: BILIARY STENT PLACEMENT;  Surgeon: [name], MD;  Location[facility] ENDOSCOPY;  Service: Gastroenterology;;  . ERCP N/A 10/31/2017   Procedure: ENDOSCOPIC RETROGRADE CHOLANGIOPANCREATOGRAPHY (ERCP);  Surgeon: [name], MD;  Location[facility] ENDOSCOPY;  Service: Gastroenterology;  Laterality: N/A;  . ESOPHAGOGASTRODUODENOSCOPY N/A 11/08/2017   Procedure: ESOPHAGOGASTRODUODENOSCOPY (EGD);  Surgeon: Milus Banister, MD;  Location: [facility] ENDOSCOPY;  Service: Endoscopy;  Laterality: N/A;  . EUS N/A 11/08/2017   Procedure: UPPER ENDOSCOPIC ULTRASOUND (EUS) RADIAL;  Surgeon: Milus Banister, MD;  Location: [facility] ENDOSCOPY;  Service: Endoscopy;  Laterality: N/A;  . FINE NEEDLE ASPIRATION  11/08/2017   Procedure: FINE NEEDLE ASPIRATION;  Surgeon: Milus Banister, MD;  Location: [facility] ENDOSCOPY;  Service: Endoscopy;;   . IR IMAGING GUIDED PORT INSERTION  11/15/2017  . LAPAROSCOPY N/A 04/25/2018   Procedure: LAPAROSCOPY DIAGNOSTIC;  Surgeon: Stark Klein, MD;  Location: [place];  Service: General;  Laterality: N/A;  GENERAL AND EPIDURAL ANESTHESIA  . SPHINCTEROTOMY  10/31/2017   Procedure: SPHINCTEROTOMY;  Surgeon: [name], MD;  Location: [facility] ENDOSCOPY;  Service: Gastroenterology;;  . TONSILLECTOMY    . WHIPPLE PROCEDURE N/A 04/25/2018   Procedure: WHIPPLE PROCEDURE, RECONSTRUCTION OF PORTAL VEIN;  Surgeon: Stark Klein, MD;  Location: [place];  Service: General;  Laterality: N/A;    Prior to Admission medications   Medication Sig Start Date End Date Taking? Authorizing Provider  acetaminophen (TYLENOL) 325 MG tablet Take 1-2 tablets (325-650 mg total) by mouth every 4 (four) hours as needed for mild pain. 05/21/18  Yes Love, Ivan Anchors, PA-C  cyclobenzaprine (FEXMID) 7.5 MG tablet Take 1 tablet (7.5 mg total) by mouth 3 (three) times daily as needed for muscle spasms. 10/11/18  Yes Truitt Merle, MD  diphenoxylate-atropine (LOMOTIL) 2.5-0.025 MG tablet Take 1 to 2 tablets PO QID prn diarrhea 08/22/18  Yes Tanner, Lyndon Code., PA-C  dronabinol (MARINOL) 2.5 MG capsule Take 1 capsule (2.5 mg total) by mouth 2 (two) times daily before lunch and supper. 09/24/18  Yes Truitt Merle, MD  gabapentin (NEURONTIN) 300 MG capsule Take 1 capsule (300 mg total) by mouth 3 (three) times daily. 09/02/18  Yes Truitt Merle, MD  lipase/protease/amylase (CREON) 36000 UNITS CPEP capsule Take 36,000 Units by mouth 3 (three) times daily before meals.   Yes [provider]  metoCLOPramide (REGLAN) 5 MG tablet Take 1 tablet (5 mg total) by mouth every 6 (six) hours as needed for nausea or vomiting. 07/23/18  Yes Alla Feeling, NP  morphine (MS CONTIN) 15 MG 12 hr tablet Take 1 tablet (15 mg total) by mouth daily. Plan to wean off in the next month 09/24/18  Yes Truitt Merle, MD  morphine (MSIR) 15 MG tablet Take 1 tablet (15 mg total) by mouth every 6  (six) hours as needed for severe pain. 10/09/18  Yes Alla Feeling, NP  pantoprazole (PROTONIX) 40 MG tablet Take 1 tablet (40 mg total) by mouth daily. 08/20/18  Yes Truitt Merle, MD  potassium chloride SA (K-DUR) 20 MEQ tablet Take 1 tablet (20 mEq total) by mouth daily. 10/09/18  Yes Alla Feeling, NP  promethazine (PHENERGAN) 25 MG tablet Take 1 tablet (25 mg total) by mouth every 6 (six) hours as needed for nausea or vomiting. 06/19/18  Yes Stark Klein, MD  ondansetron (ZOFRAN ODT) 8 MG disintegrating tablet Take 1 tablet (8 mg total) by mouth every 8 (eight) hours as needed for nausea or vomiting. 10/11/18   Truitt Merle, MD  ondansetron (ZOFRAN) 8 MG tablet Take 1 tablet (8 mg total) by mouth 3 (three) times daily before meals. 10/11/18   Truitt Merle, MD    Current Facility-Administered Medications  Medication Dose Route Frequency Provider Last Rate Last Dose  . 0.9 %  sodium chloride infusion   Intravenous Continuous Samuella Cota, MD 75 mL/hr at 10/21/18 0431    . acetaminophen (  TYLENOL) tablet 325-650 mg  325-650 mg Oral Q4H PRN Samuella Cota, MD      . Ampicillin-Sulbactam (UNASYN) 3 g in sodium chloride 0.9 % 100 mL IVPB  3 g Intravenous Q6H Wofford, Drew A, RPH 200 mL/hr at 10/21/18 0545 3 g at 10/21/18 0545  . Chlorhexidine Gluconate Cloth 2 % PADS 6 each  6 each Topical Daily Samuella Cota, MD   6 each at 10/21/18 [phone]  . cyclobenzaprine (FLEXERIL) tablet 7.5 mg  7.5 mg Oral TID PRN Samuella Cota, MD      . dronabinol (MARINOL) capsule 2.5 mg  2.5 mg Oral BID AC Samuella Cota, MD   2.5 mg at 10/20/18 2232  . gabapentin (NEURONTIN) capsule 300 mg  300 mg Oral TID Samuella Cota, MD   300 mg at 10/21/18 2122  . HYDROmorphone (DILAUDID) injection 0.5 mg  0.5 mg Intravenous Q3H PRN Samuella Cota, MD   0.5 mg at 10/21/18 4825  . lipase/protease/amylase (CREON) capsule 36,000 Units  36,000 Units Oral TID AC Samuella Cota, MD   36,000 Units at 10/21/18 [phone]  .  magnesium oxide (MAG-OX) tablet 400 mg  400 mg Oral BID Antonieta Pert, MD   400 mg at 10/21/18 0917  . morphine (MS CONTIN) 12 hr tablet 15 mg  15 mg Oral Daily Samuella Cota, MD   15 mg at 10/21/18 0488  . morphine (MSIR) tablet 15 mg  15 mg Oral Q6H PRN Samuella Cota, MD   15 mg at 10/21/18 8916  . nicotine (NICODERM CQ - dosed in mg/24 hours) patch 14 mg  14 mg Transdermal Q24H Samuella Cota, MD   14 mg at 10/20/18 1739  . ondansetron (ZOFRAN) tablet 4 mg  4 mg Oral Q6H PRN Samuella Cota, MD       Or  . ondansetron [facility]) injection 4 mg  4 mg Intravenous Q6H PRN Samuella Cota, MD   4 mg at 10/21/18 0128  . pantoprazole (PROTONIX) injection 40 mg  40 mg Intravenous Q24H Samuella Cota, MD   40 mg at 10/20/18 1738  . potassium chloride SA (KLOR-CON) CR tablet 40 mEq  40 mEq Oral Daily Kc, Ramesh, MD   40 mEq at 10/21/18 0916  . sodium chloride flush (NS) 0.9 % injection 10-40 mL  10-40 mL Intracatheter Q12H Samuella Cota, MD      . sodium chloride flush (NS) 0.9 % injection 3 mL  3 mL Intravenous Q12H Samuella Cota, MD   3 mL at 10/21/18 9450    Allergies as of 10/20/2018 - Review Complete 10/20/2018  Allergen Reaction Noted  . Latex Hives 10/30/2017    Family History  Problem Relation Age of Onset  . Diabetes Mother   . Hypertension Mother   . COPD Mother   . Uterine cancer Mother 29       had hysterectomy  . Diabetes Sister   . Hypertension Sister   . Lung cancer Maternal Grandmother        lung cancer  . Hypertension Sister     Social History   Socioeconomic History  . Marital status: Single    Spouse name: Not on file  . Number of children: Not on file  . Years of education: Not on file  . Highest education level: Not on file  Occupational History  . Occupation: unemployed  Social Needs  . Financial resource strain: Not on file  . Food  insecurity    Worry: Not on file    Inability: Not on file  . Transportation needs     Medical: No    Non-medical: No  Tobacco Use  . Smoking status: Current Every Day Smoker    Packs/day: 1.00    Years: 37.00    Pack years: 37.00    Types: Cigarettes  . Smokeless tobacco: Never Used  . Tobacco comment: Started back smoking  Substance and Sexual Activity  . Alcohol use: Not Currently  . Drug use: Yes    Types: Marijuana  . Sexual activity: Not Currently  Lifestyle  . Physical activity    Days per week: Not on file    Minutes per session: Not on file  . Stress: Not on file  Relationships  . Social Herbalist on phone: Not on file    Gets together: Not on file    Attends religious service: Not on file    Active member of club or organization: Not on file    Attends meetings of clubs or organizations: Not on file    Relationship status: Not on file  . Intimate partner violence    Fear of current or ex partner: Not on file    Emotionally abused: Not on file    Physically abused: Not on file    Forced sexual activity: Not on file  Other Topics Concern  . Not on file  Social History Narrative  . Not on file    Review of Systems: All systems reviewed and negative except where noted in HPI.  Physical Exam: Vital signs in last 24 hours: Temp:  [97.8 F (36.6 C)-98.8 F (37.1 C)] 98.7 F (37.1 C) (10/05 0543) Pulse Rate:  [67-115] 73 (10/05 0543) Resp:  [16-19] 19 (10/05 0543) BP: (117-169)/(81-103) 141/98 (10/05 0543) SpO2:  [99 %-100 %] 100 % (10/05 0543) Weight:  [40.8 kg] 40.8 kg (10/04 1158) Last BM Date: 10/19/18 General:   Alert, thin female in NAD Psych:  Pleasant, cooperative. Normal mood and affect. Eyes:  Pupils equal, sclera clear, no icterus.   Conjunctiva pink. Ears:  Normal auditory acuity. Nose:  No deformity, discharge,  or lesions. Neck:  Supple; no masses Lungs:  Clear throughout to auscultation.   No wheezes, crackles, or rhonchi.  Heart:  Regular rate and rhythm; no murmurs, no lower extremity edema Abdomen:  Soft,  non-distended, mild mid abdominal tenderness.  BS active, no palp mass   Rectal:  Deferred  Msk:  Symmetrical without gross deformities. . Neurologic:  Alert and  oriented x4;  grossly normal neurologically. Skin:  Intact without significant lesions or rashes.   Intake/Output from previous day: 10/04 0701 - 10/05 0700 In: 1178.9 [P.O.:240; I.V.:392.2; IV Piggyback:546.8] Out: 1150 [Urine:1150] Intake/Output this shift: Total I/O In: 3 [I.V.:3] Out: -   Lab Results: Recent Labs    10/20/18 1257 10/21/18 0420  WBC 5.6 3.6*  HGB 9.8* 8.7*  HCT 30.4* 27.3*  PLT 196 159   BMET Recent Labs    10/20/18 1257 10/21/18 0420  NA 135 137  K 2.9* 3.2*  CL 105 109  CO2 22 24  GLUCOSE 88 77  BUN 7 <5*  CREATININE 0.40* 0.43*  CALCIUM 8.5* 7.9*   LFT Recent Labs    10/21/18 0420  PROT 4.8*  ALBUMIN 2.5*  AST 18  ALT 20  ALKPHOS 71  BILITOT 0.6   PT/INR No results for input(s): LABPROT, INR in the last 72 hours. Hepatitis  Panel No results for input(s): HEPBSAG, HCVAB, HEPAIGM, HEPBIGM in the last 72 hours.   . CBC Latest Ref Rng & Units 10/21/2018 10/20/2018 10/16/2018  WBC 4.0 - 10.5 K/uL 3.6(L) 5.6 6.0  Hemoglobin 12.0 - 15.0 g/dL 8.7(L) 9.8(L) 11.2(L)  Hematocrit 36.0 - 46.0 % 27.3(L) 30.4(L) 34.5(L)  Platelets 150 - 400 K/uL 159 196 280    . CMP Latest Ref Rng & Units 10/21/2018 10/20/2018 10/16/2018  Glucose 70 - 99 mg/dL 77 88 70  BUN 6 - 20 mg/dL <5(L) 7 7  Creatinine 0.44 - 1.00 mg/dL 0.43(L) 0.40(L) 0.57  Sodium 135 - 145 mmol/L 137 135 139  Potassium 3.5 - 5.1 mmol/L 3.2(L) 2.9(L) 4.1  Chloride 98 - 111 mmol/L 109 105 102  CO2 22 - 32 mmol/L 24 22 27   Calcium 8.9 - 10.3 mg/dL 7.9(L) 8.5(L) 8.4(L)  Total Protein 6.5 - 8.1 g/dL 4.8(L) 5.9(L) 6.4(L)  Total Bilirubin 0.3 - 1.2 mg/dL 0.6 0.8 0.7  Alkaline Phos 38 - 126 U/L 71 91 122  AST 15 - 41 U/L 18 26 40  ALT 0 - 44 U/L 20 24 28    Studies/Results: No results found.  Principal Problem:    Pancolitis ([place]) Active Problems:   Pancreatic adenocarcinoma ([place])   Hypokalemia   Microcytic anemia    Tye Savoy, NP-C @  10/21/2018, 10:39 AM

## 2018-10-21 NOTE — Progress Notes (Signed)
HEMATOLOGY-ONCOLOGY PROGRESS NOTE  SUBJECTIVE: The patient was admitted secondary to abdominal pain, nausea, vomiting, diarrhea.  CT scan of the abdomen and pelvis showed diffuse wall thickening of the entire colon consistent with pancolitis.  Currently receiving radiation to the tumor bed.  The patient reports that she is feeling better this morning.  Reports that her abdominal pain is significantly improved.  She has been trying to take oral pain medications and using IV Dilaudid only as needed.  She denies having any nausea or vomiting at this time.  Diarrhea has resolved.  Has been tolerating clear liquids well.  She is hoping to discharge home today.  Oncology History Overview Note  Cancer Staging Pancreatic cancer [facility]) Staging form: Exocrine Pancreas, AJCC 8th Edition - Clinical stage from 11/08/2017: Stage IB (cT2, cN0, cM0) - Signed by Truitt Merle, MD on 11/09/2017     Malignant neoplasm of head of pancreas ([place])  10/30/2017 Imaging   MRI Abdomen 10/30/17  IMPRESSION: 1. Marked intra and extrahepatic biliary duct dilatation with abrupt cut off of the common bile duct at the pancreatic head. This is associated with marked dilatation of the main pancreatic duct also with an abrupt cut off at the level of the pancreatic head. Pancreatic parenchyma in the body and tail of pancreas is markedly atrophic. A subtle 2.5 x 3 cm lesion with differential signal intensity is identified in the head of the pancreas which appears slightly hypoenhancing on postcontrast imaging. Together, these features are highly concerning for pancreatic adenocarcinoma. EUS is recommended to further evaluate. 2. Portal vein is patent although there is some mass-effect on the portal splenic confluence and IVC. Fat planes around celiac axis and SMA appear preserved. 3. Upper normal to borderline portal caval lymph node associated with small right para-aortic lymphadenopathy.     10/30/2017 Tumor Marker   Baseline Ca 19-9 at 140   10/31/2017 Procedure   ERCP by Dr. Lyndel Safe 10/31/17  IMPRESSION Malignant appearing CBD stricture (due to pancreatic mass) status post sphincterotomy and stent insertion.   11/08/2017 Cancer Staging   Staging form: Exocrine Pancreas, AJCC 8th Edition - Clinical stage from 11/08/2017: Stage IB (cT2, cN0, cM0) - Signed by Truitt Merle, MD on 11/09/2017   11/08/2017 Procedure   EUS and EGD by Dr. Ardis Hughs 11/08/17  IMPRESSION -Very vague, irregularly bordered, approximately 2.6cm mass was found in the head of pancreas. This causes biliary and pancreatic duct obstruction. The previously plastic biliary stent is in good position. The mass directly abuts and attenuates the PV/SMV confluence, suggesting invasion. FNA performed and the preliminary cytology review is positive for maligancy (adenocarcinoma). - [facility] [place] [facility] office will arrange referrals to medical and surgical oncology. - Case to be discussed at upcoming multidisciplinary GI tumor conference.   11/08/2017 Initial Biopsy   Biopsy Cytology 11/08/17 Diagnosis FINE NEEDLE ASPIRATION, ENDOSCOPIC, PANCREAS HEAD (SPECIMEN 1 OF 1 COLLECTED 11/08/17): MALIGNANT CELLS CONSISTENT WITH ADENOCARCINOMA.   11/09/2017 Initial Diagnosis   Pancreatic cancer ([place])   11/15/2017 Imaging   CT Chest and Pelvis WO Contrast 11/15/17 IMPRESSION: 1. No findings of active malignancy in the chest or pelvis. 2. Aortic Atherosclerosis (ICD10-I70.0). Coronary atherosclerosis. 3. Faint centrilobular nodularity in the lung apices, raising the possibility of hypersensitivity pneumonitis or less likely respiratory bronchiolitis. Airway thickening is present, suggesting bronchitis or reactive airways disease.   11/19/2017 -  Chemotherapy   FOLFIRINOX q2weeks with Neulasta injection for 4 months starting 11/19/17. Completed 8 cycles on 03/07/18    02/04/2018 Imaging   CT AP  W Contrast 02/04/18  IMPRESSION: 1. No substantial change in size  of mass involving head of pancreas. A aortocaval lymph node which appears increased in the interval measuring 1.1 cm versus 0.8 cm previously. Stable 1.2 cm peripancreatic node. 2. Dilated proximal common bile duct is mildly increased in caliber compared with 12/12/2017. The common bile duct stent remains in place in appears to be in appropriate position.    04/19/2018 Imaging   CT AP 04/19/18  IMPRESSION: 1. Essentially stable exam compared to most recent CT scans of 02/04/2018 and 12/12/2017. 2. Stable mass in the head of the pancreas with duct dilatation and atrophy upstream. Mass continues to surround the GDA and contacts the portal vein but is free of the celiac trunk and SMA. Mass probably contacts the RIGHT hepatic artery at the takeoff of the GDA (image 56/5). Dominant LEFT hepatic artery originates from the LEFT gastric. 3. Mild intrahepatic and extrahepatic duct dilatation similar to prior with flexible stent in the common bile duct. 4. No new or progressive disease. 5. No peritoneal metastasis.   04/25/2018 Imaging   CT Angio chest 04/25/18  IMPRESSION: 1. No demonstrable pulmonary embolus. No thoracic aortic aneurysm or dissection. 2. Areas of atelectatic change bilaterally. No consolidation. No pneumothorax. Endotracheal tube tip is slightly superior to the carina. 3. Enlarged subcarinal lymph node, concerning for neoplastic etiology. 4. 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 Biliary stent present. Biliary duct air is felt to be secondary to the stent placement. 5. Mild upper abdominal ascites. Pneumoperitoneum with apparent drainage catheter present. The pneumoperitoneum is likely of post procedural/postoperative etiology. Bowel perforation in this circumstance cannot be excluded entirely, however. Close clinical assessment in this regard advised. 6.  Left ventricular hypertrophy.   04/25/2018 Surgery   LAPAROSCOPY DIAGNOSTIC and WHIPPLE  PROCEDURE, RECONSTRUCTION OF PORTAL VEIN by Dr. Barry Dienes, Dr. Marcello Moores 04/25/18    04/25/2018 Pathology Results    Diagnosis 04/25/18 1. Whipple procedure/resection, with gallbladder with segment of portal vein - INVASIVE ADENOCARCINOMA, MODERATELY DIFFERENTIATED, SPANNING 3.5 CM. - ADENOCARCINOMA IS PRESENT AT THE SUPERIOR MARGIN OF THE ATTACHED VEIN AND THE INFERIOR MARGIN OF THE ATTACHED VEIN (BLOCKS E AND F). - ADENOCARCINOMA EXTENDS INTO PERIPANCREATIC SOFT TISSUE. - PERINEURAL INVASION IS IDENTIFIED, DIFFUSE. - METASTATIC ADENOCARCINOMA IN 1 OF 24 LYMPH NODES (1/24). - SEE ONCOLOGY TABLE BELOW. 2. Lymph node, biopsy, portal - THERE IS NO EVIDENCE OF CARCINOMA IN 5 OF 5 LYMPH NODES (0/5). 3. Pancreas, biopsy, additional pancreatic margin - HIGH GRADE GLANDULAR DYSPLASIA, FOCAL    05/09/2018 Imaging   CT AP W Contrast 05/09/18 IMPRESSION: Status post Whipple's procedure. 1 surgical drain remains in the area. Pancreatic duct stent is noted.   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 It appears to be stable in size compared to prior exam.   Wall and fold thickening is seen involving right and transverse colon which may represent edema or possibly inflammation.   Aortic Atherosclerosis (ICD10-I70.0).   06/15/2018 Imaging   CT AP W Contrast 06/15/18 IMPRESSION: Changes of prior Whipple procedure. Previously seen pancreatic duct stent presumably has migrated across the choledocho-jejunostomy and is now located in the biliary ducts. No biliary or pancreatic ductal dilatation.   Severe diffuse low-density throughout the liver, likely fatty infiltration.   Wall thickening within the ascending colon and hepatic flexure concerning for colitis.   Small amount of free fluid  in the pelvis.   Aortic atherosclerosis.   07/25/2018 PET scan   PET 07/25/18  IMPRESSION: 1. Right hepatic lobe  hypermetabolism, in the setting of heterogeneous hepatic steatosis. Finding is highly suspicious for isolated hepatic metastasis. 2. More equivocal lower level hypermetabolism along the medial hepatic capsule and within the hepatic dome. These areas could be postoperative and physiologic respectively. Recommend attention on follow-up. 3. Coronary artery atherosclerosis. Aortic Atherosclerosis (ICD10-I70.0).   08/13/2018 Pathology Results   Diagnosis 08/13/18 Liver, needle/core biopsy, right - LIVER PARENCHYMA WITH A BILE EXTRAVASATION AND HISTIOCYTES - SEE COMMENT   10/09/2018 -  Radiation Therapy   Adjuvant radiation with Dr. Lisbeth Renshaw 10/09/18-11/18/18      REVIEW OF SYSTEMS:   As noted in the HPI.  I have reviewed the past medical history, past surgical history, social history and family history with the patient and they are unchanged from previous note.  PHYSICAL EXAMINATION: ECOG PERFORMANCE STATUS: 1 - Symptomatic but completely ambulatory  Vitals:   10/20/18 2031 10/21/18 0543  BP: (!) 146/99 (!) 141/98  Pulse: 67 73  Resp: 17 19  Temp: 97.8 F (36.6 C) 98.7 F (37.1 C)  SpO2: 100% 100%   Filed Weights   10/20/18 1158  Weight: 90 lb (40.8 kg)    Intake/Output from previous day: 10/04 0701 - 10/05 0700 In: 1178.9 [P.O.:240; I.V.:392.2; IV Piggyback:546.8] Out: 1150 [Urine:1150]  GENERAL:alert, no distress and comfortable SKIN: skin color, texture, turgor are normal, no rashes or significant lesions EYES: normal, Conjunctiva are pink and non-injected, sclera clear OROPHARYNX:no exudate, no erythema and lips, buccal mucosa, and tongue normal  NECK: supple, thyroid normal size, non-tender, without nodularity LYMPH:  no palpable lymphadenopathy in the cervical, axillary or inguinal LUNGS: clear to auscultation and percussion with normal breathing effort HEART: regular rate & rhythm and no murmurs and no lower extremity edema ABDOMEN: Positive bowel sounds, abdomen  soft, reports mild tenderness with palpation over surgical site Musculoskeletal:no cyanosis of digits and no clubbing  NEURO: alert & oriented x 3 with fluent speech, no focal motor/sensory deficits  LABORATORY DATA:  I have reviewed the data as listed CMP Latest Ref Rng & Units 10/21/2018 10/20/2018 10/16/2018  Glucose 70 - 99 mg/dL 77 88 70  BUN 6 - 20 mg/dL <5(L) 7 7  Creatinine 0.44 - 1.00 mg/dL 0.43(L) 0.40(L) 0.57  Sodium 135 - 145 mmol/L 137 135 139  Potassium 3.5 - 5.1 mmol/L 3.2(L) 2.9(L) 4.1  Chloride 98 - 111 mmol/L 109 105 102  CO2 22 - 32 mmol/L 24 22 27   Calcium 8.9 - 10.3 mg/dL 7.9(L) 8.5(L) 8.4(L)  Total Protein 6.5 - 8.1 g/dL 4.8(L) 5.9(L) 6.4(L)  Total Bilirubin 0.3 - 1.2 mg/dL 0.6 0.8 0.7  Alkaline Phos 38 - 126 U/L 71 91 122  AST 15 - 41 U/L 18 26 40  ALT 0 - 44 U/L 20 24 28     Lab Results  Component Value Date   WBC 3.6 (L) 10/21/2018   HGB 8.7 (L) 10/21/2018   HCT 27.3 (L) 10/21/2018   MCV 63.6 (L) 10/21/2018   PLT 159 10/21/2018   NEUTROABS 4.7 10/16/2018    No results found.  ASSESSMENT AND PLAN: 1.  Pancreatic adenocarcinoma, cT2 N0 M0, stage Ib, status post chemo and Whipple surgery 2.  Pancolitis 3.  Recurrent abdominal pain with nausea and vomiting, improved 4.  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  Decreased appetite and weight loss  -The patient's symptoms are  significantly improved since receiving IV fluids and IV antibiotics.  Tolerating clear liquids well.  Recommend advancing diet to see how she tolerates. -I have encouraged the patient to take oral pain medications and avoid IV pain medication to be sure that her pain is controlled with oral meds. -We will defer to radiation oncology regarding ongoing treatment from their standpoint. -From our standpoint, the patient could be discharged to home if tolerating diet and pain is controlled with oral pain medications.  Her nausea and vomiting seem  to be well-controlled with antiemetics and she she continues at home.  Will defer to hospitalist as to whether she needs antibiotics as an outpatient.  Was previously receiving Cipro and Flagyl at home. -Continue Marinol for decreased appetite and weight loss.    LOS: 1 day   Mikey Bussing, DNP, AGPCNP-BC, AOCNP 10/21/18

## 2018-10-22 ENCOUNTER — Inpatient Hospital Stay: Payer: Medicaid Other

## 2018-10-22 ENCOUNTER — Encounter (HOSPITAL_COMMUNITY): Payer: Self-pay

## 2018-10-22 ENCOUNTER — Other Ambulatory Visit: Payer: Self-pay

## 2018-10-22 ENCOUNTER — Emergency Department (HOSPITAL_COMMUNITY)
Admission: EM | Admit: 2018-10-22 | Discharge: 2018-10-22 | Disposition: A | Discharge status: Home / Self Care | Payer: Medicaid Other | Source: Home / Self Care

## 2018-10-22 ENCOUNTER — Inpatient Hospital Stay: Payer: Medicaid Other | Attending: Hematology | Admitting: Medical

## 2018-10-22 ENCOUNTER — Telehealth: Payer: Self-pay | Admitting: Family Medicine

## 2018-10-22 ENCOUNTER — Encounter (HOSPITAL_COMMUNITY): Payer: Self-pay | Admitting: *Deleted

## 2018-10-22 ENCOUNTER — Ambulatory Visit
Admission: RE | Admit: 2018-10-22 | Discharge: 2018-10-22 | Disposition: A | Discharge status: Home / Self Care | Payer: Medicaid Other | Source: Ambulatory Visit | Attending: Radiation Oncology | Admitting: Radiation Oncology

## 2018-10-22 ENCOUNTER — Telehealth: Payer: Self-pay

## 2018-10-22 ENCOUNTER — Inpatient Hospital Stay (HOSPITAL_COMMUNITY)
Admission: AD | Admit: 2018-10-22 | Discharge: 2018-10-24 | DRG: 392 | Disposition: A | Discharge status: Home / Self Care | Payer: Medicaid Other | Source: Ambulatory Visit | Attending: Internal Medicine | Admitting: Internal Medicine

## 2018-10-22 ENCOUNTER — Telehealth: Payer: Self-pay | Admitting: Medical

## 2018-10-22 VITALS — BP 162/97 | HR 90 | Temp 98.2°F | Resp 18 | Ht <= 58 in | Wt 94.1 lb

## 2018-10-22 DIAGNOSIS — I700 Atherosclerosis of aorta: Secondary | ICD-10-CM | POA: Insufficient documentation

## 2018-10-22 DIAGNOSIS — R109 Unspecified abdominal pain: Secondary | ICD-10-CM | POA: Insufficient documentation

## 2018-10-22 DIAGNOSIS — Z20828 Contact with and (suspected) exposure to other viral communicable diseases: Secondary | ICD-10-CM | POA: Diagnosis present

## 2018-10-22 DIAGNOSIS — K529 Noninfective gastroenteritis and colitis, unspecified: Principal | ICD-10-CM | POA: Diagnosis present

## 2018-10-22 DIAGNOSIS — Z8507 Personal history of malignant neoplasm of pancreas: Secondary | ICD-10-CM

## 2018-10-22 DIAGNOSIS — T451X5A Adverse effect of antineoplastic and immunosuppressive drugs, initial encounter: Secondary | ICD-10-CM | POA: Insufficient documentation

## 2018-10-22 DIAGNOSIS — F17210 Nicotine dependence, cigarettes, uncomplicated: Secondary | ICD-10-CM | POA: Diagnosis not present

## 2018-10-22 DIAGNOSIS — F329 Major depressive disorder, single episode, unspecified: Secondary | ICD-10-CM | POA: Insufficient documentation

## 2018-10-22 DIAGNOSIS — C259 Malignant neoplasm of pancreas, unspecified: Secondary | ICD-10-CM

## 2018-10-22 DIAGNOSIS — C250 Malignant neoplasm of head of pancreas: Secondary | ICD-10-CM | POA: Diagnosis not present

## 2018-10-22 DIAGNOSIS — K760 Fatty (change of) liver, not elsewhere classified: Secondary | ICD-10-CM | POA: Insufficient documentation

## 2018-10-22 DIAGNOSIS — Z9221 Personal history of antineoplastic chemotherapy: Secondary | ICD-10-CM | POA: Insufficient documentation

## 2018-10-22 DIAGNOSIS — K5100 Ulcerative (chronic) pancolitis without complications: Secondary | ICD-10-CM

## 2018-10-22 DIAGNOSIS — Z91040 Latex allergy status: Secondary | ICD-10-CM

## 2018-10-22 DIAGNOSIS — R110 Nausea: Secondary | ICD-10-CM | POA: Insufficient documentation

## 2018-10-22 DIAGNOSIS — K648 Other hemorrhoids: Secondary | ICD-10-CM | POA: Diagnosis present

## 2018-10-22 DIAGNOSIS — E876 Hypokalemia: Secondary | ICD-10-CM | POA: Diagnosis not present

## 2018-10-22 DIAGNOSIS — Z8042 Family history of malignant neoplasm of prostate: Secondary | ICD-10-CM | POA: Insufficient documentation

## 2018-10-22 DIAGNOSIS — D6481 Anemia due to antineoplastic chemotherapy: Secondary | ICD-10-CM | POA: Insufficient documentation

## 2018-10-22 DIAGNOSIS — C257 Malignant neoplasm of other parts of pancreas: Secondary | ICD-10-CM | POA: Insufficient documentation

## 2018-10-22 DIAGNOSIS — D509 Iron deficiency anemia, unspecified: Secondary | ICD-10-CM | POA: Diagnosis present

## 2018-10-22 DIAGNOSIS — Z5321 Procedure and treatment not carried out due to patient leaving prior to being seen by health care provider: Secondary | ICD-10-CM | POA: Insufficient documentation

## 2018-10-22 DIAGNOSIS — R591 Generalized enlarged lymph nodes: Secondary | ICD-10-CM | POA: Insufficient documentation

## 2018-10-22 DIAGNOSIS — I2510 Atherosclerotic heart disease of native coronary artery without angina pectoris: Secondary | ICD-10-CM | POA: Insufficient documentation

## 2018-10-22 DIAGNOSIS — Z90411 Acquired partial absence of pancreas: Secondary | ICD-10-CM

## 2018-10-22 DIAGNOSIS — Z8249 Family history of ischemic heart disease and other diseases of the circulatory system: Secondary | ICD-10-CM

## 2018-10-22 DIAGNOSIS — K5730 Diverticulosis of large intestine without perforation or abscess without bleeding: Secondary | ICD-10-CM | POA: Diagnosis present

## 2018-10-22 DIAGNOSIS — Z833 Family history of diabetes mellitus: Secondary | ICD-10-CM

## 2018-10-22 DIAGNOSIS — R630 Anorexia: Secondary | ICD-10-CM | POA: Insufficient documentation

## 2018-10-22 DIAGNOSIS — D563 Thalassemia minor: Secondary | ICD-10-CM | POA: Diagnosis present

## 2018-10-22 DIAGNOSIS — Z79899 Other long term (current) drug therapy: Secondary | ICD-10-CM | POA: Insufficient documentation

## 2018-10-22 DIAGNOSIS — Z801 Family history of malignant neoplasm of trachea, bronchus and lung: Secondary | ICD-10-CM | POA: Insufficient documentation

## 2018-10-22 DIAGNOSIS — R634 Abnormal weight loss: Secondary | ICD-10-CM | POA: Insufficient documentation

## 2018-10-22 LAB — CBC WITH DIFFERENTIAL (CANCER CENTER ONLY)
Abs Immature Granulocytes: 0.03 10*3/uL (ref 0.00–0.07)
Basophils Absolute: 0 10*3/uL (ref 0.0–0.1)
Basophils Relative: 0 %
Eosinophils Absolute: 0 10*3/uL (ref 0.0–0.5)
Eosinophils Relative: 1 %
HCT: 30.8 % — ABNORMAL LOW (ref 36.0–46.0)
Hemoglobin: 10.2 g/dL — ABNORMAL LOW (ref 12.0–15.0)
Immature Granulocytes: 1 %
Lymphocytes Relative: 9 %
Lymphs Abs: 0.5 10*3/uL — ABNORMAL LOW (ref 0.7–4.0)
MCH: 20.4 pg — ABNORMAL LOW (ref 26.0–34.0)
MCHC: 33.1 g/dL (ref 30.0–36.0)
MCV: 61.6 fL — ABNORMAL LOW (ref 80.0–100.0)
Monocytes Absolute: 0.4 10*3/uL (ref 0.1–1.0)
Monocytes Relative: 6 %
Neutro Abs: 5.1 10*3/uL (ref 1.7–7.7)
Neutrophils Relative %: 83 %
Platelet Count: 211 10*3/uL (ref 150–400)
RBC: 5 MIL/uL (ref 3.87–5.11)
RDW: 19 % — ABNORMAL HIGH (ref 11.5–15.5)
WBC Count: 6 10*3/uL (ref 4.0–10.5)
nRBC: 0 % (ref 0.0–0.2)

## 2018-10-22 LAB — CMP (CANCER CENTER ONLY)
ALT: 24 U/L (ref 0–44)
AST: 26 U/L (ref 15–41)
Albumin: 3.2 g/dL — ABNORMAL LOW (ref 3.5–5.0)
Alkaline Phosphatase: 92 U/L (ref 38–126)
Anion gap: 8 (ref 5–15)
BUN: <4 mg/dL — ABNORMAL LOW (ref 6–20)
CO2: 26 mmol/L (ref 22–32)
Calcium: 8.1 mg/dL — ABNORMAL LOW (ref 8.9–10.3)
Chloride: 103 mmol/L (ref 98–111)
Creatinine: 0.52 mg/dL (ref 0.44–1.00)
GFR, Est AFR Am: >60 mL/min (ref >60)
GFR, Estimated: >60 mL/min (ref >60)
Glucose, Bld: 94 mg/dL (ref 70–99)
Potassium: 3.1 mmol/L — ABNORMAL LOW (ref 3.5–5.1)
Sodium: 137 mmol/L (ref 135–145)
Total Bilirubin: 0.6 mg/dL (ref 0.3–1.2)
Total Protein: 6.1 g/dL — ABNORMAL LOW (ref 6.5–8.1)

## 2018-10-22 LAB — MAGNESIUM: Magnesium: 1.6 mg/dL — ABNORMAL LOW (ref 1.7–2.4)

## 2018-10-22 MED ORDER — PANTOPRAZOLE SODIUM 40 MG PO TBEC
40.0000 mg | DELAYED_RELEASE_TABLET | Freq: Every day | ORAL | Status: DC
  Administered 2018-10-22 – 2018-10-24 (×3): 40 mg via ORAL
  Filled 2018-10-22 (×2): qty 1

## 2018-10-22 MED ORDER — MAGNESIUM SULFATE 2 GM/50ML IV SOLN
2.0000 g | Freq: Once | INTRAVENOUS | Status: AC
  Administered 2018-10-22: 2 g via INTRAVENOUS
  Filled 2018-10-22: qty 50

## 2018-10-22 MED ORDER — SODIUM CHLORIDE 0.9% FLUSH
10.0000 mL | INTRAVENOUS | Status: DC | PRN
  Filled 2018-10-22: qty 10

## 2018-10-22 MED ORDER — PEG-KCL-NACL-NASULF-NA ASC-C 100 G PO SOLR
0.5000 | Freq: Once | ORAL | Status: AC
  Administered 2018-10-22: 100 g via ORAL
  Filled 2018-10-22: qty 1

## 2018-10-22 MED ORDER — ONDANSETRON HCL 4 MG/2ML IJ SOLN
INTRAMUSCULAR | Status: AC
  Filled 2018-10-22: qty 4

## 2018-10-22 MED ORDER — MORPHINE SULFATE (PF) 4 MG/ML IV SOLN
2.0000 mg | Freq: Once | INTRAVENOUS | Status: AC
  Administered 2018-10-22: 2 mg via INTRAVENOUS

## 2018-10-22 MED ORDER — MAGNESIUM OXIDE 400 (241.3 MG) MG PO TABS
400.0000 mg | ORAL_TABLET | Freq: Two times a day (BID) | ORAL | Status: DC
  Administered 2018-10-22 – 2018-10-24 (×5): 400 mg via ORAL
  Filled 2018-10-22 (×5): qty 1

## 2018-10-22 MED ORDER — MORPHINE SULFATE (PF) 4 MG/ML IV SOLN
INTRAVENOUS | Status: AC
  Filled 2018-10-22: qty 1

## 2018-10-22 MED ORDER — MORPHINE SULFATE 15 MG PO TABS
15.0000 mg | ORAL_TABLET | Freq: Four times a day (QID) | ORAL | Status: DC | PRN
  Administered 2018-10-23 – 2018-10-24 (×2): 15 mg via ORAL
  Filled 2018-10-22 (×2): qty 1

## 2018-10-22 MED ORDER — NICOTINE 7 MG/24HR TD PT24
7.0000 mg | MEDICATED_PATCH | Freq: Every day | TRANSDERMAL | Status: DC
  Administered 2018-10-22 – 2018-10-24 (×3): 7 mg via TRANSDERMAL
  Filled 2018-10-22 (×3): qty 1

## 2018-10-22 MED ORDER — GABAPENTIN 300 MG PO CAPS
300.0000 mg | ORAL_CAPSULE | Freq: Three times a day (TID) | ORAL | Status: DC
  Administered 2018-10-22 – 2018-10-24 (×6): 300 mg via ORAL
  Filled 2018-10-22 (×6): qty 1

## 2018-10-22 MED ORDER — POTASSIUM CHLORIDE CRYS ER 20 MEQ PO TBCR
20.0000 meq | EXTENDED_RELEASE_TABLET | Freq: Two times a day (BID) | ORAL | Status: DC
  Administered 2018-10-22 – 2018-10-24 (×3): 20 meq via ORAL
  Filled 2018-10-22 (×4): qty 1

## 2018-10-22 MED ORDER — CHLORHEXIDINE GLUCONATE CLOTH 2 % EX PADS
6.0000 | MEDICATED_PAD | Freq: Every day | CUTANEOUS | Status: DC
  Administered 2018-10-22 – 2018-10-23 (×2): 6 via TOPICAL

## 2018-10-22 MED ORDER — DRONABINOL 2.5 MG PO CAPS
2.5000 mg | ORAL_CAPSULE | Freq: Two times a day (BID) | ORAL | Status: DC
  Administered 2018-10-22 – 2018-10-23 (×2): 2.5 mg via ORAL
  Filled 2018-10-22 (×2): qty 1

## 2018-10-22 MED ORDER — SODIUM CHLORIDE 0.9% FLUSH
3.0000 mL | Freq: Two times a day (BID) | INTRAVENOUS | Status: DC
  Administered 2018-10-23 (×2): 3 mL via INTRAVENOUS

## 2018-10-22 MED ORDER — ONDANSETRON HCL 4 MG/2ML IJ SOLN
8.0000 mg | Freq: Once | INTRAMUSCULAR | Status: AC
  Administered 2018-10-22: 11:00:00 8 mg via INTRAVENOUS

## 2018-10-22 MED ORDER — CYCLOBENZAPRINE HCL 5 MG PO TABS: 7.5000 mg | ORAL_TABLET | Freq: Three times a day (TID) | ORAL | Status: DC | PRN

## 2018-10-22 MED ORDER — PEG-KCL-NACL-NASULF-NA ASC-C 100 G PO SOLR: 1.0000 | Freq: Once | ORAL | Status: DC

## 2018-10-22 MED ORDER — ACETAMINOPHEN 650 MG RE SUPP: 650.0000 mg | Freq: Four times a day (QID) | RECTAL | Status: DC | PRN

## 2018-10-22 MED ORDER — ONDANSETRON 4 MG PO TBDP: 8.0000 mg | ORAL_TABLET | Freq: Three times a day (TID) | ORAL | Status: DC | PRN

## 2018-10-22 MED ORDER — ACETAMINOPHEN 325 MG PO TABS: 650.0000 mg | ORAL_TABLET | Freq: Four times a day (QID) | ORAL | Status: DC | PRN

## 2018-10-22 MED ORDER — SODIUM CHLORIDE 0.9 % IV SOLN
Freq: Once | INTRAVENOUS | Status: AC
  Administered 2018-10-22: 11:00:00 via INTRAVENOUS
  Filled 2018-10-22: qty 250

## 2018-10-22 MED ORDER — PEG-KCL-NACL-NASULF-NA ASC-C 100 G PO SOLR
0.5000 | Freq: Once | ORAL | Status: AC
  Administered 2018-10-23: 100 g via ORAL

## 2018-10-22 MED ORDER — POTASSIUM CHLORIDE 10 MEQ/100ML IV SOLN
10.0000 meq | INTRAVENOUS | Status: AC
  Administered 2018-10-22 (×4): 10 meq via INTRAVENOUS
  Filled 2018-10-22 (×4): qty 100

## 2018-10-22 MED ORDER — PROMETHAZINE HCL 25 MG PO TABS
25.0000 mg | ORAL_TABLET | Freq: Four times a day (QID) | ORAL | Status: DC | PRN
  Administered 2018-10-22: 25 mg via ORAL
  Filled 2018-10-22 (×2): qty 1

## 2018-10-22 MED ORDER — ONDANSETRON HCL 4 MG/2ML IJ SOLN
4.0000 mg | Freq: Four times a day (QID) | INTRAMUSCULAR | Status: DC | PRN
  Administered 2018-10-22: 4 mg via INTRAVENOUS
  Filled 2018-10-22 (×2): qty 2

## 2018-10-22 MED ORDER — HEPARIN SOD (PORK) LOCK FLUSH 100 UNIT/ML IV SOLN
500.0000 [IU] | Freq: Once | INTRAVENOUS | Status: DC | PRN
  Filled 2018-10-22: qty 5

## 2018-10-22 MED ORDER — MORPHINE SULFATE ER 15 MG PO TBCR
15.0000 mg | EXTENDED_RELEASE_TABLET | Freq: Every day | ORAL | Status: DC
  Administered 2018-10-22 – 2018-10-24 (×4): 15 mg via ORAL
  Filled 2018-10-22 (×4): qty 1

## 2018-10-22 MED ORDER — POTASSIUM CHLORIDE 10 MEQ/100ML IV SOLN
INTRAVENOUS | Status: AC
  Filled 2018-10-22: qty 100

## 2018-10-22 MED ORDER — HYDROMORPHONE HCL 1 MG/ML IJ SOLN
1.0000 mg | INTRAMUSCULAR | Status: DC | PRN
  Administered 2018-10-22 – 2018-10-24 (×14): 1 mg via INTRAVENOUS
  Filled 2018-10-22 (×14): qty 1

## 2018-10-22 MED ORDER — MORPHINE SULFATE 4 MG/ML IJ SOLN
1.0000 mg | Freq: Once | INTRAMUSCULAR | Status: AC
  Administered 2018-10-22: 12:00:00 1 mg via INTRAVENOUS
  Filled 2018-10-22: qty 1

## 2018-10-22 MED ORDER — PANCRELIPASE (LIP-PROT-AMYL) 12000-38000 UNITS PO CPEP
36000.0000 [IU] | ORAL_CAPSULE | Freq: Three times a day (TID) | ORAL | Status: DC
  Administered 2018-10-22 – 2018-10-24 (×3): 36000 [IU] via ORAL
  Filled 2018-10-22 (×3): qty 3

## 2018-10-22 NOTE — ED Notes (Signed)
Pt did not come when called for blood draw and vitals to be updated.

## 2018-10-22 NOTE — Telephone Encounter (Signed)
Patient left a voicemail asking to be seen because she felt like she was "discharged from the hospital prematurely and has not been able to keep anything down". Updated Alfredia Client who stated he had availability on his schedule to assess patient. Received verbal orders for CBC, CMP, and Magnesium.   Orders placed and high priority message sent for lab and [facility] appointments to be made for today. Called patient back with update. She stated she was in the car and would arrive at the [facility] shortly

## 2018-10-22 NOTE — H&P (Signed)
History and Physical  Jane Brooks DDU:[id number] DOB: Apr 15, 1970 DOA: 10/22/2018  PCP: Patient, No Pcp Per   Chief Complaint: Recurrent abdominal pain, vomiting  HPI:  48 year old woman PMH pancreatic cancer status post Whipple procedure and chemotherapy, currently undergoing radiation therapy, PRESENTED to [place] today for follow-up and was accepted as a direct admission for recurrent abdominal pain and vomiting, pancolitis.  Patient was just admitted 10/4 with the same presentation and discharged 10/5 after being seen by gastroenterology.  Colonoscopy was recommended but the patient requested to defer this to the outpatient setting.  She was discharged on no antibiotics as it is not clear that this is infectious.  She reports after being at home she developed recurrent and worsening abdominal pain, nausea and vomiting and was unable to take medications by mouth.  No specific aggravating or alleviating factors.  No fever or associated symptoms noted.  Review of Systems:  Negative for fever, visual changes, sore throat, rash, new muscle aches, chest pain, shortness of breath, dysuria, bleeding.  PMH . Pancreatic cancer status post Whipple procedure and chemotherapy, currently undergoing radiation therapy  PSH . As above . Remainder reviewed in Epic  Family history includes: . Mother with diabetes and hypertension . Remainder reviewed in [place] History . Smokes but is thinking about quitting.  Does not drink.  Allergies . Latex causes hives  Meds include: . Cyclobenzaprine 7.5 mg 3 times daily as needed for muscle spasms . Marinol 2.5 mg twice daily . Gabapentin 300 mg 3 times daily . Creon with meals . MS Contin 15 mg daily . Morphine immediate release 15 mg every 6 hours as needed for as needed for severe pain . Protonix daily . Remainder reviewed in [place]:  . Afebrile 98.2, 17, 72, 174/92, 100% on room air  Constitutional:    . Appears calm and comfortable Eyes:  . pupils and irises appear normal . Normal lids  ENMT:  . grossly normal hearing  . Lips appear normal Neck:  . neck appears normal, no masses . no thyromegaly Respiratory:  . CTA bilaterally, no w/r/r.  . Respiratory effort normal. No retractions or accessory muscle use Cardiovascular:  . RRR, no m/r/g . No LE extremity edema   Abdomen:  . Abdomen appears unchanged compared to 48 hours ago.  Soft, mild generalized tenderness especially in the mid abdomen. Musculoskeletal:  . Grossly unremarkable. Skin:  . No rashes, lesions, ulcers . palpation of skin: no induration or nodules Psychiatric:  . Mental status o Mood, affect appropriate . judgment and insight appear intact   I have personally reviewed following labs and imaging studies  Labs:  Marland Kitchen Potassium 3.1, remainder BMP unremarkable . Hemoglobin stable at 10.2, no leukocytosis . C. difficile was negative 10/5   Significant Hospital Events   . 10/6 placed in observation for pancolitis, abdominal pain, nausea, vomiting   Consults:  . Low-power gastroenterology   Procedures:  .   Significant Diagnostic Tests:  Marland Kitchen    Micro Data:  . GI pathogen panel pending from 10/5   Antimicrobials:  .   ASSESSMENT/PLAN  Ongoing pancolitis with generalized abdominal pain, nausea and vomiting. --Seen by gastroenterology yesterday with recommendation for inpatient colonoscopy which the patient deferred at that time.  She now is back in the hospital with similar symptoms. --Antiemetics, analgesics PRN --GI consulted.  Likely plan for colonoscopy tomorrow. --Not clearly infectious given chronicity, will hold off on antibiotics at this point.  Hypokalemia,  hypomagnesemia --Replete.  Check BMP in a.m.  Pancreatic adenocarcinoma status post chemotherapy, Whipple procedure.  Begin radiation therapy 9/23. 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 Check the care team in [facility] and look for a) attending/consulting [facility] provider listed and b) the [facility] team listed 2. Log into [email or web address] and use [place]'s universal password to access. If you do not have the password, please contact the hospital operator. 3. Locate the [facility] provider you are looking for under [facility] and page to a number that you can be directly reached. 4. If you still have difficulty reaching the provider, please page the [facility] (Director on Call) for the Hospitalists listed on amion for assistance.  Severity of Illness: The appropriate patient status for this patient is OBSERVATION. Observation status is judged to be reasonable and necessary in order to provide the required intensity of service to ensure the patient's safety. The patient's presenting symptoms, physical exam findings, and initial radiographic and laboratory data in the context of their medical condition is felt to place them at decreased risk for further clinical deterioration. Furthermore, it is anticipated that the patient will be medically stable for discharge from the hospital within 2 midnights of admission. The following factors support the patient status of observation.   " The patient's presenting symptoms include abdominal pain, vomiting. " The physical exam findings include generalized abdominal pain. " The initial radiographic and laboratory data are notable for hypokalemia.    10/22/2018, 2:16 PM   Principal Problem:   Pancolitis ([place]) Active Problems:   Pancreatic adenocarcinoma ([facility])   Hypokalemia    Microcytic anemia   Cigarette smoker   Hypomagnesemia

## 2018-10-22 NOTE — H&P (View-Only) (Signed)
     [facility] Progress Note  CC:  Colitis, vomiting, abdominal pain  Subjective: We received a call back about this patient.  She was seen in consult yesterday, but was feeling better, therefore, was discharged home and plan was for outpatient colonoscopy.  She returned again today with complaints of recurrent abdominal pain and vomiting.  Please see the consult note from Dr. Tarri Glenn and Tye Savoy, NP, from October 21, 2018 for more details.  Objective:  Vital signs in last 24 hours: Temp:  [98.2 F (36.8 C)-99.3 F (37.4 C)] 98.8 F (37.1 C) (10/06 1228) Pulse Rate:  [72-113] 72 (10/06 1228) Resp:  [17-18] 17 (10/06 1228) BP: (143-174)/(92-97) 174/92 (10/06 1228) SpO2:  [100 %] 100 % (10/06 1228) Weight:  [42.7 kg-43.1 kg] 42.7 kg (10/06 1001)   General:  Alert, Well-developed, in NAD Heart:  Regular rate and rhythm; no murmurs Pulm:  CTAB.  No increased WOB. Abdomen:  Soft, non-distended.  Laparotomy scars noted.  BS present.  Diffuse TTP even to light palpation. Extremities:  Without edema. Neurologic:  Alert and oriented x 4;  grossly normal neurologically. Psych:  Alert and cooperative. Normal mood and affect.  Lab Results: Recent Labs    10/20/18 1257 10/21/18 0420 10/22/18 1014  WBC 5.6 3.6* 6.0  HGB 9.8* 8.7* 10.2*  HCT 30.4* 27.3* 30.8*  PLT 196 159 211   BMET Recent Labs    10/20/18 1257 10/21/18 0420 10/22/18 1014  NA 135 137 137  K 2.9* 3.2* 3.1*  CL 105 109 103  CO2 22 24 26   GLUCOSE 88 77 94  BUN 7 <5* <4*  CREATININE 0.40* 0.43* 0.52  CALCIUM 8.5* 7.9* 8.1*   LFT Recent Labs    10/22/18 1014  PROT 6.1*  ALBUMIN 3.2*  AST 26  ALT 24  ALKPHOS 92  BILITOT 0.6   Assessment / Plan:  17. 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 Because of positive margins on path ( vein ) she started  Radiation treatments last week.   2. Episodic nausea, vomiting, generalized mid abdominal pain in setting of colitis on three CT scans since May 2020. Treated twice empirically with Augmentin. Unclear why she is having these episodic symptoms feeling relatively well in between.   -Plan for colonoscopy on 10/7 with Dr. Lyndel Safe. -Clear liquids for now then NPO after midnight except for bowel prep.    LOS: 1 day   [name]. [name]  10/22/2018, 2:57 PM     Attending physician's note   I have taken an interval history, reviewed the chart and examined the patient. I agree with the Advanced Practitioner's note, impression and recommendations.   Pt known to me.  Acute, recurrent colitis of ? etiology (on 3 CT scans since May showing pancolitis). Neg C. Diff. Neg COVID-19, not neutropenic  HOP [place] Ca (1B, T2N0M0) s/p neoadjuvant chemo followed by Whipple 04/2018 with positive margins, currently on XRT treatments. CA19-9 trending up (30 to 48)  Plan: -Follow GI pathogen panel. -Colonoscopy with Bx (for CMV/EBV as well) in AM.  Have discussed risks and benefits. -Monitor and correct electrolytes esp K/Mg.   Carmell Austria, MD [name] [phone].

## 2018-10-22 NOTE — Telephone Encounter (Signed)
No los per 10/6.

## 2018-10-22 NOTE — Telephone Encounter (Signed)
Transfer from outpatient cancer center [facility]  48 year old woman PMH pancreatic cancer, chronic pancolitis, admitted 10/4, discharged 10/5, seen in oncology clinic today with recurrent abdominal pain.  Observation requested for treatment of pain.  Apparently GI considered colonoscopy on last admission.  PMH includes  Pancreatic cancer status post Whipple  Exam: Per Mr. Tanner patient appears stable. 98.2, 18, 90, 162/27, 100% on room air  Data  CBC unremarkable  COVID was -48 hours ago  A/P -- Observation to medical bed for recurrent abdominal pain --GI consultation  Murray Hodgkins, MD [facility] [phone]

## 2018-10-22 NOTE — Progress Notes (Addendum)
     [facility] Progress Note  CC:  Colitis, vomiting, abdominal pain  Subjective: We received a call back about this patient.  She was seen in consult yesterday, but was feeling better, therefore, was discharged home and plan was for outpatient colonoscopy.  She returned again today with complaints of recurrent abdominal pain and vomiting.  Please see the consult note from Dr. Tarri Glenn and Tye Savoy, NP, from October 21, 2018 for more details.  Objective:  Vital signs in last 24 hours: Temp:  [98.2 F (36.8 C)-99.3 F (37.4 C)] 98.8 F (37.1 C) (10/06 1228) Pulse Rate:  [72-113] 72 (10/06 1228) Resp:  [17-18] 17 (10/06 1228) BP: (143-174)/(92-97) 174/92 (10/06 1228) SpO2:  [100 %] 100 % (10/06 1228) Weight:  [42.7 kg-43.1 kg] 42.7 kg (10/06 1001)   General:  Alert, Well-developed, in NAD Heart:  Regular rate and rhythm; no murmurs Pulm:  CTAB.  No increased WOB. Abdomen:  Soft, non-distended.  Laparotomy scars noted.  BS present.  Diffuse TTP even to light palpation. Extremities:  Without edema. Neurologic:  Alert and oriented x 4;  grossly normal neurologically. Psych:  Alert and cooperative. Normal mood and affect.  Lab Results: Recent Labs    10/20/18 1257 10/21/18 0420 10/22/18 1014  WBC 5.6 3.6* 6.0  HGB 9.8* 8.7* 10.2*  HCT 30.4* 27.3* 30.8*  PLT 196 159 211   BMET Recent Labs    10/20/18 1257 10/21/18 0420 10/22/18 1014  NA 135 137 137  K 2.9* 3.2* 3.1*  CL 105 109 103  CO2 22 24 26   GLUCOSE 88 77 94  BUN 7 <5* <4*  CREATININE 0.40* 0.43* 0.52  CALCIUM 8.5* 7.9* 8.1*   LFT Recent Labs    10/22/18 1014  PROT 6.1*  ALBUMIN 3.2*  AST 26  ALT 24  ALKPHOS 92  BILITOT 0.6   Assessment / Plan:  53. 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 Because of positive margins on path ( vein ) she started  Radiation treatments last week.   2. Episodic nausea, vomiting, generalized mid abdominal pain in setting of colitis on three CT scans since May 2020. Treated twice empirically with Augmentin. Unclear why she is having these episodic symptoms feeling relatively well in between.   -Plan for colonoscopy on 10/7 with Dr. Lyndel Safe. -Clear liquids for now then NPO after midnight except for bowel prep.    LOS: 1 day   [name]. [name]  10/22/2018, 2:57 PM     Attending physician's note   I have taken an interval history, reviewed the chart and examined the patient. I agree with the Advanced Practitioner's note, impression and recommendations.   Pt known to me.  Acute, recurrent colitis of ? etiology (on 3 CT scans since May showing pancolitis). Neg C. Diff. Neg COVID-19, not neutropenic  HOP [place] Ca (1B, T2N0M0) s/p neoadjuvant chemo followed by Whipple 04/2018 with positive margins, currently on XRT treatments. CA19-9 trending up (30 to 48)  Plan: -Follow GI pathogen panel. -Colonoscopy with Bx (for CMV/EBV as well) in AM.  Have discussed risks and benefits. -Monitor and correct electrolytes esp K/Mg.   Carmell Austria, MD [name] [phone].

## 2018-10-22 NOTE — Progress Notes (Signed)
Report given by phone to [facility], [place].  Pt transported ambulatory w/belongings to [id number].

## 2018-10-22 NOTE — ED Triage Notes (Signed)
Pt arrived via [facility] states she left the hospital this morning expecting to manage her symptoms at home. Reports NVD. Pain 9/10

## 2018-10-22 NOTE — Progress Notes (Signed)
[facility] Progress Note   Jane Brooks [id number] [birth year] 48 y.o.  Zafiro Routson is managed by Dr. Truitt Merle  Actively treated with chemotherapy/immunotherapy/hormonal therapy: Radiation therapy  Current therapy: Radiation therapy  Next scheduled appointment with provider:  10/23/2018  Assessment: Plan:    Malignant neoplasm of head of pancreas ([place]) - Plan: heparin lock flush 100 unit/mL, sodium chloride flush (NS) 0.9 % injection 10 mL, 0.9 %  sodium chloride infusion, ondansetron (ZOFRAN) injection 8 mg, morphine 4 MG/ML injection 1 mg  Pancolitis ([place]) - Plan: 0.9 %  sodium chloride infusion, ondansetron (ZOFRAN) injection 8 mg, morphine 4 MG/ML injection 2 mg   Malignant neoplasm of head of pancreas: The patient is currently receiving radiation therapy to her tumor bed due to 1+ local pancreatic lymph node.  Pancolitis: The patient was discharged from [facility].  She had been offered a colonoscopy while an inpatient.  She stated that she could not stay for this procedure.  It has yet to be scheduled with plans to have this procedure done within 1 to 2 weeks.  She presents to the office today stating that she wishes that she would have stayed and hospital to have this procedure done as her pain has continued if not worsened.  We appreciate Dr. Holli Humbles willingness to have her directly admitted to 6 E.  She had a negative COVID-19 test completed on 10/20/2018.  Please see After Visit Summary for patient specific instructions.  Future Appointments  Date Time Provider [place]  10/22/2018  1:30 PM [facility] [id number] [facility] None  10/23/2018  8:00 AM [facility] [id number] [facility] None  10/23/2018  9:30 AM [facility] LAB 6 [facility] None  10/23/2018  9:45 AM [facility] [place] FLUSH [facility] None  10/23/2018 10:30 AM [facility] INFUSION [facility] None  10/24/2018  8:00 AM [facility] [id number] [facility] None  10/25/2018   8:00 AM [facility] [id number] [facility] None  10/28/2018  8:30 AM [facility] [id number] [facility] None  10/29/2018  8:15 AM [facility] LINAC 3 [facility] None  10/30/2018  8:30 AM [facility] [id number] [facility] None  10/30/2018 10:30 AM [facility] LAB ONLY [facility] None  10/30/2018 10:45 AM [facility] [place] FLUSH [facility] None  10/30/2018 11:20 AM Truitt Merle, MD [facility] None  10/30/2018 12:00 PM [facility] INFUSION [facility] None  10/31/2018  8:30 AM [facility] [id number] [facility] None  11/01/2018  8:30 AM [facility] [id number] [facility] None  11/04/2018  8:30 AM [facility] [id number] [facility] None  11/05/2018  8:30 AM [facility] [id number] [facility] None  11/06/2018  8:30 AM [facility] [id number] [facility] None  11/06/2018  9:30 AM [facility] LAB 6 [facility] None  11/06/2018  9:45 AM [facility] FLUSH [facility] None  11/06/2018 10:30 AM [facility] INFUSION [facility] None  11/07/2018  8:30 AM [facility] LINAC 4 [facility] None  11/08/2018  8:30 AM [facility] [id number] [facility] None  11/11/2018  8:30 AM [facility] [id number] [facility] None  11/12/2018  8:30 AM [facility] [id number] [facility] None  11/13/2018  8:15 AM [facility] LAB 3 [facility] None  11/13/2018  8:30 AM [facility] [place] FLUSH [facility] None  11/13/2018  9:15 AM [facility] INFUSION [facility] None  11/14/2018  8:35 AM [facility] [id number] [facility] None  11/15/2018  8:35 AM [facility] [id number] [facility] None  11/18/2018  8:35 AM [facility] [id number] [facility] None  11/20/2018  9:00 AM [facility] LAB 6 [facility] None  11/20/2018  9:15 AM [facility] [place] FLUSH [facility] None  11/20/2018 10:00 AM [facility] INFUSION [facility] None    No orders of the defined types were placed in this encounter.  Subjective:   Patient ID:  Jane Brooks is a 48 y.o. (DOB Jun 07, 1970) female.  Chief Complaint:  Chief Complaint  Patient presents with  . Abdominal Pain     HPI [name]  Is a 48 y.o. female with a diagnosis of adenocarcinoma of the head of the pancreas.  She is status post Whipple's procedure with 1 lymph node returning positive.  She is currently receiving radiation therapy and today has received approximately 9/25 fractions of radiation therapy.  She was admitted to [facility] this past weekend and was diagnosed with pancolitis.  She has a GI work-up pending.  A C. difficile returned negative.  Some of her labs from her hospitalization have not been resulted as yet.  She was offered a colonoscopy during her hospitalization.  She stated that she could not stay and requested a discharge home.  Plans were to have gastroenterology contact the patient and schedule a colonoscopy with in 1 to 2 weeks.  Her pain has continued.  She has had nausea and vomiting as well as diarrhea.  She presented to the emergency room early this morning but left without being seen due to the fact that there were other patients in the emergency room waiting area that were coughing.  She presents to the clinic today with continuation of her abdominal pain and with ongoing nausea and vomiting.  She was given 1 L of normal saline along with Zofran 8 mg IV x1 and was given morphine sulfate 2 mg IV x1 which was followed with 1 mg approximately 45 minutes later due to her ongoing pain.  This case was discussed with Dr. Truitt Merle Who agrees with her admission.  We appreciate the fact that Dr. Murray Hodgkins has agreed to have her directly admitted to 6 E.  Her oncologic history is outlined below.  Oncology History Overview Note   Cancer Staging Pancreatic cancer [facility]) Staging form: Exocrine Pancreas, AJCC 8th Edition - Clinical stage from 11/08/2017: Stage IB (cT2, cN0, cM0) - Signed by Truitt Merle, MD on 11/09/2017     Malignant neoplasm of head of pancreas ([place])   10/30/2017 Imaging    MRI Abdomen 10/30/17  IMPRESSION: 1. Marked intra and extrahepatic biliary duct  dilatation with abrupt cut off of the common bile duct at the pancreatic head. This is associated with marked dilatation of the main pancreatic duct also with an abrupt cut off at the level of the pancreatic head. Pancreatic parenchyma in the body and tail of pancreas is markedly atrophic. A subtle 2.5 x 3 cm lesion with differential signal intensity is identified in the head of the pancreas which appears slightly hypoenhancing on postcontrast imaging. Together, these features are highly concerning for pancreatic adenocarcinoma. EUS is recommended to further evaluate. 2. Portal vein is patent although there is some mass-effect on the portal splenic confluence and IVC. Fat planes around celiac axis and SMA appear preserved. 3. Upper normal to borderline portal caval lymph node associated with small right para-aortic lymphadenopathy.      10/30/2017 Tumor Marker    Baseline Ca 19-9 at 140    10/31/2017 Procedure    ERCP by Dr. Lyndel Safe 10/31/17  IMPRESSION Malignant appearing CBD stricture (due to pancreatic mass) status post sphincterotomy and stent insertion.    11/08/2017 Cancer Staging    Staging form: Exocrine Pancreas, AJCC 8th Edition - Clinical stage from 11/08/2017: Stage IB (cT2, cN0, cM0) - Signed by Truitt Merle, MD on 11/09/2017    11/08/2017 Procedure  EUS and EGD by Dr. Ardis Hughs 11/08/17  IMPRESSION -Very vague, irregularly bordered, approximately 2.6cm mass was found in the head of pancreas. This causes biliary and pancreatic duct obstruction. The previously plastic biliary stent is in good position. The mass directly abuts and attenuates the PV/SMV confluence, suggesting invasion. FNA performed and the preliminary cytology review is positive for maligancy (adenocarcinoma). - [place] office will arrange referrals to medical and surgical oncology. - Case to be discussed at upcoming multidisciplinary GI tumor conference.    11/08/2017 Initial Biopsy     Biopsy Cytology 11/08/17 Diagnosis FINE NEEDLE ASPIRATION, ENDOSCOPIC, PANCREAS HEAD (SPECIMEN 1 OF 1 COLLECTED 11/08/17): MALIGNANT CELLS CONSISTENT WITH ADENOCARCINOMA.    11/09/2017 Initial Diagnosis    Pancreatic cancer ([place])    11/15/2017 Imaging    CT Chest and Pelvis WO Contrast 11/15/17 IMPRESSION: 1. No findings of active malignancy in the chest or pelvis. 2. Aortic Atherosclerosis (ICD10-I70.0). Coronary atherosclerosis. 3. Faint centrilobular nodularity in the lung apices, raising the possibility of hypersensitivity pneumonitis or less likely respiratory bronchiolitis. Airway thickening is present, suggesting bronchitis or reactive airways disease.    11/19/2017 -  Chemotherapy    FOLFIRINOX q2weeks with Neulasta injection for 4 months starting 11/19/17. Completed 8 cycles on 03/07/18     02/04/2018 Imaging    CT AP W Contrast 02/04/18  IMPRESSION: 1. No substantial change in size of mass involving head of pancreas. A aortocaval lymph node which appears increased in the interval measuring 1.1 cm versus 0.8 cm previously. Stable 1.2 cm peripancreatic node. 2. Dilated proximal common bile duct is mildly increased in caliber compared with 12/12/2017. The common bile duct stent remains in place in appears to be in appropriate position.     04/19/2018 Imaging    CT AP 04/19/18  IMPRESSION: 1. Essentially stable exam compared to most recent CT scans of 02/04/2018 and 12/12/2017. 2. Stable mass in the head of the pancreas with duct dilatation and atrophy upstream. Mass continues to surround the GDA and contacts the portal vein but is free of the celiac trunk and SMA. Mass probably contacts the RIGHT hepatic artery at the takeoff of the GDA (image 56/5). Dominant LEFT hepatic artery originates from the LEFT gastric. 3. Mild intrahepatic and extrahepatic duct dilatation similar to prior with flexible stent in the common bile duct. 4. No new or progressive disease.  5. No peritoneal metastasis.    04/25/2018 Imaging    CT Angio chest 04/25/18  IMPRESSION: 1. No demonstrable pulmonary embolus. No thoracic aortic aneurysm or dissection. 2. Areas of atelectatic change bilaterally. No consolidation. No pneumothorax. Endotracheal tube tip is slightly superior to the carina. 3. Enlarged subcarinal lymph node, concerning for neoplastic etiology. 4. 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 Biliary stent present. Biliary duct air is felt to be secondary to the stent placement. 5. Mild upper abdominal ascites. Pneumoperitoneum with apparent drainage catheter present. The pneumoperitoneum is likely of post procedural/postoperative etiology. Bowel perforation in this circumstance cannot be excluded entirely, however. Close clinical assessment in this regard advised. 6.  Left ventricular hypertrophy.    04/25/2018 Surgery    LAPAROSCOPY DIAGNOSTIC and WHIPPLE PROCEDURE, RECONSTRUCTION OF PORTAL VEIN by Dr. Barry Dienes, Dr. Marcello Moores 04/25/18     04/25/2018 Pathology Results     Diagnosis 04/25/18 1. Whipple procedure/resection, with gallbladder with segment of portal vein - INVASIVE ADENOCARCINOMA, MODERATELY DIFFERENTIATED, SPANNING 3.5 CM. - ADENOCARCINOMA IS PRESENT AT THE SUPERIOR MARGIN OF THE ATTACHED VEIN AND THE INFERIOR MARGIN OF  THE ATTACHED VEIN (BLOCKS E AND F). - ADENOCARCINOMA EXTENDS INTO PERIPANCREATIC SOFT TISSUE. - PERINEURAL INVASION IS IDENTIFIED, DIFFUSE. - METASTATIC ADENOCARCINOMA IN 1 OF 24 LYMPH NODES (1/24). - SEE ONCOLOGY TABLE BELOW. 2. Lymph node, biopsy, portal - THERE IS NO EVIDENCE OF CARCINOMA IN 5 OF 5 LYMPH NODES (0/5). 3. Pancreas, biopsy, additional pancreatic margin - HIGH GRADE GLANDULAR DYSPLASIA, FOCAL     05/09/2018 Imaging    CT AP W Contrast 05/09/18 IMPRESSION: Status post Whipple's procedure. 1 surgical drain remains in the area. Pancreatic duct stent is noted.  Irregular low density is  noted in the right hepatic lobe consistent with of all the infarction of the right hepatic lobe, as noted on prior MRI. It appears to be stable in size compared to prior exam.  Wall and fold thickening is seen involving right and transverse colon which may represent edema or possibly inflammation.  Aortic Atherosclerosis (ICD10-I70.0).    06/15/2018 Imaging    CT AP W Contrast 06/15/18 IMPRESSION: Changes of prior Whipple procedure. Previously seen pancreatic duct stent presumably has migrated across the choledocho-jejunostomy and is now located in the biliary ducts. No biliary or pancreatic ductal dilatation.  Severe diffuse low-density throughout the liver, likely fatty infiltration.  Wall thickening within the ascending colon and hepatic flexure concerning for colitis.  Small amount of free fluid in the pelvis.  Aortic atherosclerosis.    07/25/2018 PET scan    PET 07/25/18  IMPRESSION: 1. Right hepatic lobe hypermetabolism, in the setting of heterogeneous hepatic steatosis. Finding is highly suspicious for isolated hepatic metastasis. 2. More equivocal lower level hypermetabolism along the medial hepatic capsule and within the hepatic dome. These areas could be postoperative and physiologic respectively. Recommend attention on follow-up. 3. Coronary artery atherosclerosis. Aortic Atherosclerosis (ICD10-I70.0).    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 Lisbeth Renshaw 10/09/18-11/18/18       CURRENT THERAPY:  Radiation starting 10/09/18-11/18/18  Medications: I have reviewed the patient's current medications.  Allergies:  Allergies  Allergen Reactions  . Latex Hives    Past Medical History:  Diagnosis Date  . Family history of lung cancer   . Family history of prostate  cancer   . Family history of uterine cancer   . Gallstones 10/2017  . Pancreatic cancer ([place])   . Pneumothorax, closed, traumatic    years ago  . PONV (postoperative nausea and vomiting)     Past Surgical History:  Procedure Laterality Date  . BILIARY STENT PLACEMENT  10/31/2017   Procedure: BILIARY STENT PLACEMENT;  Surgeon: [name], MD;  Location: [facility] ENDOSCOPY;  Service: Gastroenterology;;  . ERCP N/A 10/31/2017   Procedure: ENDOSCOPIC RETROGRADE CHOLANGIOPANCREATOGRAPHY (ERCP);  Surgeon: [name], MD;  Location: [facility] ENDOSCOPY;  Service: Gastroenterology;  Laterality: N/A;  . ESOPHAGOGASTRODUODENOSCOPY N/A 11/08/2017   Procedure: ESOPHAGOGASTRODUODENOSCOPY (EGD);  Surgeon: Milus Banister, MD;  Location: [facility] ENDOS[facility];  Service: Endoscopy;  Laterality: N/A;  . EUS N/A 11/08/2017   Procedure: UPPER ENDOSCOPIC ULTRASOUND (EUS) RADIAL;  Surgeon: Milus Banister, MD;  Location: [facility] ENDOSCOPY;  Service: Endoscopy;  Laterality: N/A;  . FINE NEEDLE ASPIRATION  11/08/2017   Procedure: FINE NEEDLE ASPIRATION;  Surgeon: Milus Banister, MD;  Location: [facility] ENDOSCOPY;  Service: Endoscopy;;  . IR IMAGING GUIDED PORT INSERTION  11/15/2017  . LAPAROSCOPY N/A  04/25/2018   Procedure: LAPAROSCOPY DIAGNOSTIC;  Surgeon: Stark Klein, MD;  Location: [place];  Service: General;  Laterality: N/A;  GENERAL AND EPIDURAL ANESTHESIA  . SPHINCTEROTOMY  10/31/2017   Procedure: SPHINCTEROTOMY;  Surgeon: [name], MD;  Location: [facility] ENDOSCOPY;  Service: Gastroenterology;;  . TONSILLECTOMY    . WHIPPLE PROCEDURE N/A 04/25/2018   Procedure: WHIPPLE PROCEDURE, RECONSTRUCTION OF PORTAL VEIN;  Surgeon: Stark Klein, MD;  Location: [place];  Service: General;  Laterality: N/A;    Family History  Problem Relation Age of Onset  . Diabetes Mother   . Hypertension Mother   . COPD Mother   . Uterine cancer Mother 67       had hysterectomy  . Diabetes Sister   . Hypertension Sister   . Lung cancer Maternal  Grandmother        lung cancer  . Hypertension Sister     Social History   Socioeconomic History  . Marital status: Single    Spouse name: Not on file  . Number of children: Not on file  . Years of education: Not on file  . Highest education level: Not on file  Occupational History  . Occupation: unemployed  Social Needs  . Financial resource strain: Not on file  . Food insecurity    Worry: Not on file    Inability: Not on file  . Transportation needs    Medical: No    Non-medical: No  Tobacco Use  . Smoking status: Current Every Day Smoker    Packs/day: 1.00    Years: 37.00    Pack years: 37.00    Types: Cigarettes  . Smokeless tobacco: Never Used  . Tobacco comment: Started back smoking  Substance and Sexual Activity  . Alcohol use: Not Currently  . Drug use: Yes    Types: Marijuana  . Sexual activity: Not Currently  Lifestyle  . Physical activity    Days per week: Not on file    Minutes per session: Not on file  . Stress: Not on file  Relationships  . Social Herbalist on phone: Not on file    Gets together: Not on file    Attends religious service: Not on file    Active member of club or organization: Not on file    Attends meetings of clubs or organizations: Not on file    Relationship status: Not on file  . Intimate partner violence    Fear of current or ex partner: Not on file    Emotionally abused: Not on file    Physically abused: Not on file    Forced sexual activity: Not on file  Other Topics Concern  . Not on file  Social History Narrative  . Not on file    Past Medical History, Surgical history, Social history, and Family history were reviewed and updated as appropriate.   Please see review of systems for further details on the patient's review from today.   Review of Systems:  Review of Systems  Constitutional: Positive for appetite change. Negative for activity change, chills, diaphoresis and fever.  Respiratory: Negative for  cough, chest tightness and shortness of breath.   Cardiovascular: Negative for chest pain, palpitations and leg swelling.  Gastrointestinal: Positive for abdominal pain, nausea and vomiting. Negative for abdominal distention, constipation and diarrhea.    Objective:   Physical Exam:  BP (!) 162/97 (BP Location: Right Arm, Patient Position: Sitting) Comment: NOtified Nurse of BP  Pulse 90  Temp 98.2 F (36.8 C) (Temporal)   Resp 18   Ht 4' 10"  (1.473 m)   Wt 94 lb 1.6 oz (42.7 kg)   SpO2 100%   BMI 19.67 kg/m  ECOG: 1  Physical Exam Constitutional:      General: She is not in acute distress.    Appearance: She is not diaphoretic.  HENT:     Head: Normocephalic and atraumatic.     Mouth/Throat:     Pharynx: No oropharyngeal exudate.  Neck:     Musculoskeletal: Normal range of motion and neck supple.  Cardiovascular:     Rate and Rhythm: Normal rate and regular rhythm.     Heart sounds: Normal heart sounds. No murmur. No friction rub. No gallop.      Comments: An accessed right chest wall Port-A-Cath is noted. Pulmonary:     Effort: Pulmonary effort is normal. No respiratory distress.     Breath sounds: Normal breath sounds. No wheezing or rales.  Abdominal:     General: Bowel sounds are absent. There is no distension.     Palpations: Abdomen is soft. There is no mass.     Tenderness: There is generalized abdominal tenderness. There is no guarding or rebound.  Lymphadenopathy:     Cervical: No cervical adenopathy.  Skin:    General: Skin is warm and dry.     Findings: No erythema or rash.  Neurological:     Mental Status: She is alert.     Cranial Nerves: No cranial nerve deficit.     Coordination: Coordination normal.  Psychiatric:        Behavior: Behavior normal.        Thought Content: Thought content normal.        Judgment: Judgment normal.     Lab Review:     Component Value Date/Time   NA 137 10/22/2018 1014   K 3.1 (L) 10/22/2018 1014   CL 103  10/22/2018 1014   CO2 26 10/22/2018 1014   GLUCOSE 94 10/22/2018 1014   BUN <4 (L) 10/22/2018 1014   CREATININE 0.52 10/22/2018 1014   CALCIUM 8.1 (L) 10/22/2018 1014   PROT 6.1 (L) 10/22/2018 1014   ALBUMIN 3.2 (L) 10/22/2018 1014   AST 26 10/22/2018 1014   ALT 24 10/22/2018 1014   ALKPHOS 92 10/22/2018 1014   BILITOT 0.6 10/22/2018 1014   GFRNONAA >60 10/22/2018 1014   GFRAA >60 10/22/2018 1014       Component Value Date/Time   WBC 6.0 10/22/2018 1014   WBC 3.6 (L) 10/21/2018 0420   RBC 5.00 10/22/2018 1014   HGB 10.2 (L) 10/22/2018 1014   HCT 30.8 (L) 10/22/2018 1014   PLT 211 10/22/2018 1014   MCV 61.6 (L) 10/22/2018 1014   MCH 20.4 (L) 10/22/2018 1014   MCHC 33.1 10/22/2018 1014   RDW 19.0 (H) 10/22/2018 1014   LYMPHSABS 0.5 (L) 10/22/2018 1014   MONOABS 0.4 10/22/2018 1014   EOSABS 0.0 10/22/2018 1014   BASOSABS 0.0 10/22/2018 1014   -------------------------------  Imaging from last 24 hours (if applicable):  Radiology interpretation: No results found.

## 2018-10-22 NOTE — Patient Instructions (Signed)

## 2018-10-23 ENCOUNTER — Telehealth: Payer: Self-pay

## 2018-10-23 ENCOUNTER — Encounter (HOSPITAL_COMMUNITY): Payer: Self-pay | Admitting: Anesthesiology

## 2018-10-23 ENCOUNTER — Observation Stay (HOSPITAL_COMMUNITY): Payer: Medicaid Other | Admitting: Anesthesiology

## 2018-10-23 ENCOUNTER — Inpatient Hospital Stay: Payer: Medicaid Other

## 2018-10-23 ENCOUNTER — Ambulatory Visit: Payer: Medicaid Other

## 2018-10-23 ENCOUNTER — Encounter (HOSPITAL_COMMUNITY)
Admission: AD | Disposition: A | Discharge status: Home / Self Care | Payer: Self-pay | Source: Ambulatory Visit | Attending: Internal Medicine

## 2018-10-23 DIAGNOSIS — F17210 Nicotine dependence, cigarettes, uncomplicated: Secondary | ICD-10-CM | POA: Diagnosis present

## 2018-10-23 DIAGNOSIS — D509 Iron deficiency anemia, unspecified: Secondary | ICD-10-CM | POA: Diagnosis present

## 2018-10-23 DIAGNOSIS — Z91040 Latex allergy status: Secondary | ICD-10-CM | POA: Diagnosis not present

## 2018-10-23 DIAGNOSIS — Z833 Family history of diabetes mellitus: Secondary | ICD-10-CM | POA: Diagnosis not present

## 2018-10-23 DIAGNOSIS — Z9221 Personal history of antineoplastic chemotherapy: Secondary | ICD-10-CM | POA: Diagnosis not present

## 2018-10-23 DIAGNOSIS — D563 Thalassemia minor: Secondary | ICD-10-CM | POA: Diagnosis present

## 2018-10-23 DIAGNOSIS — Z20828 Contact with and (suspected) exposure to other viral communicable diseases: Secondary | ICD-10-CM | POA: Diagnosis present

## 2018-10-23 DIAGNOSIS — Z90411 Acquired partial absence of pancreas: Secondary | ICD-10-CM | POA: Diagnosis not present

## 2018-10-23 DIAGNOSIS — K5730 Diverticulosis of large intestine without perforation or abscess without bleeding: Secondary | ICD-10-CM | POA: Diagnosis present

## 2018-10-23 DIAGNOSIS — Z8507 Personal history of malignant neoplasm of pancreas: Secondary | ICD-10-CM | POA: Diagnosis not present

## 2018-10-23 DIAGNOSIS — E876 Hypokalemia: Secondary | ICD-10-CM | POA: Diagnosis present

## 2018-10-23 DIAGNOSIS — K5100 Ulcerative (chronic) pancolitis without complications: Secondary | ICD-10-CM | POA: Diagnosis not present

## 2018-10-23 DIAGNOSIS — K648 Other hemorrhoids: Secondary | ICD-10-CM | POA: Diagnosis present

## 2018-10-23 DIAGNOSIS — K529 Noninfective gastroenteritis and colitis, unspecified: Secondary | ICD-10-CM | POA: Diagnosis present

## 2018-10-23 DIAGNOSIS — R112 Nausea with vomiting, unspecified: Secondary | ICD-10-CM | POA: Diagnosis present

## 2018-10-23 DIAGNOSIS — Z8249 Family history of ischemic heart disease and other diseases of the circulatory system: Secondary | ICD-10-CM | POA: Diagnosis not present

## 2018-10-23 HISTORY — PX: BIOPSY: SHX5522

## 2018-10-23 HISTORY — PX: COLONOSCOPY WITH PROPOFOL: SHX5780

## 2018-10-23 LAB — BASIC METABOLIC PANEL
Anion gap: 14 (ref 5–15)
BUN: <5 mg/dL — ABNORMAL LOW (ref 6–20)
CO2: 18 mmol/L — ABNORMAL LOW (ref 22–32)
Calcium: 8 mg/dL — ABNORMAL LOW (ref 8.9–10.3)
Chloride: 102 mmol/L (ref 98–111)
Creatinine, Ser: 0.4 mg/dL — ABNORMAL LOW (ref 0.44–1.00)
GFR calc Af Amer: >60 mL/min (ref >60)
GFR calc non Af Amer: >60 mL/min (ref >60)
Glucose, Bld: 68 mg/dL — ABNORMAL LOW (ref 70–99)
Potassium: 3.6 mmol/L (ref 3.5–5.1)
Sodium: 134 mmol/L — ABNORMAL LOW (ref 135–145)

## 2018-10-23 LAB — MAGNESIUM: Magnesium: 2.1 mg/dL (ref 1.7–2.4)

## 2018-10-23 LAB — PHOSPHORUS: Phosphorus: 3.9 mg/dL (ref 2.5–4.6)

## 2018-10-23 SURGERY — COLONOSCOPY WITH PROPOFOL
Anesthesia: Monitor Anesthesia Care

## 2018-10-23 MED ORDER — PRO-STAT SUGAR FREE PO LIQD: 30.0000 mL | Freq: Two times a day (BID) | ORAL | Status: DC

## 2018-10-23 MED ORDER — BOOST / RESOURCE BREEZE PO LIQD CUSTOM: 1.0000 | Freq: Three times a day (TID) | ORAL | Status: DC

## 2018-10-23 MED ORDER — ADULT MULTIVITAMIN W/MINERALS CH
1.0000 | ORAL_TABLET | Freq: Every day | ORAL | Status: DC
  Administered 2018-10-24: 10:00:00 1 via ORAL
  Filled 2018-10-23: qty 1

## 2018-10-23 MED ORDER — LACTATED RINGERS IV SOLN
INTRAVENOUS | Status: DC | PRN
  Administered 2018-10-23: 14:00:00 via INTRAVENOUS

## 2018-10-23 MED ORDER — MIDAZOLAM HCL 5 MG/5ML IJ SOLN
INTRAMUSCULAR | Status: DC | PRN
  Administered 2018-10-23: 2 mg via INTRAVENOUS

## 2018-10-23 MED ORDER — PROPOFOL 10 MG/ML IV BOLUS
INTRAVENOUS | Status: AC
  Filled 2018-10-23: qty 40

## 2018-10-23 MED ORDER — PROPOFOL 500 MG/50ML IV EMUL
INTRAVENOUS | Status: DC | PRN
  Administered 2018-10-23: 175 ug/kg/min via INTRAVENOUS

## 2018-10-23 MED ORDER — SODIUM CHLORIDE 0.9 % IV SOLN
INTRAVENOUS | Status: DC
  Administered 2018-10-23: 13:00:00 via INTRAVENOUS

## 2018-10-23 MED ORDER — PROPOFOL 500 MG/50ML IV EMUL
INTRAVENOUS | Status: DC | PRN
  Administered 2018-10-23: 40 mg via INTRAVENOUS

## 2018-10-23 MED ORDER — MIDAZOLAM HCL 2 MG/2ML IJ SOLN
INTRAMUSCULAR | Status: AC
  Filled 2018-10-23: qty 2

## 2018-10-23 SURGICAL SUPPLY — 22 items

## 2018-10-23 NOTE — Interval H&P Note (Signed)
History and Physical Interval Note:  10/23/2018 1:12 PM  Jane Brooks  has presented today for surgery, with the diagnosis of Colitis.  The various methods of treatment have been discussed with the patient and family. After consideration of risks, benefits and other options for treatment, the patient has consented to  Procedure(s): COLONOSCOPY WITH PROPOFOL (N/A) as a surgical intervention.  The patient's history has been reviewed, patient examined, no change in status, stable for surgery.  I have reviewed the patient's chart and labs.  Questions were answered to the patient's satisfaction.     [name]

## 2018-10-23 NOTE — Progress Notes (Signed)
PROGRESS NOTE    Jane Brooks  SNK:[id number] DOB: 1970/10/03 DOA: 10/22/2018 PCP: Patient, No Pcp Per    Brief Narrative:   Aubri Gathright is a 48 year old woman PMHx pancreatic cancer status post Whipple procedure and chemotherapy, currently undergoing radiation therapy who initially presented to  be cancer Center today for follow-up and was accepted as a direct admission for recurrent abdominal pain and vomiting, associated with pancolitis.  Patient was just admitted 10/4 with the same presentation and discharged 10/5 after being seen by gastroenterology.  Colonoscopy was recommended but the patient requested to defer this to the outpatient setting.  She was discharged on no antibiotics as it is not clear that this is infectious.  She reports after being at home she developed recurrent and worsening abdominal pain, nausea and vomiting and was unable to take medications by mouth.  No specific aggravating or alleviating factors.  No fever or associated symptoms noted.   Assessment & Plan:   Principal Problem:   Pancolitis ([place]) Active Problems:   Pancreatic adenocarcinoma ([facility])   Hypokalemia   Microcytic anemia   Cigarette smoker   Hypomagnesemia   Persistent pancolitis with generalized abdominal pain, nausea and vomiting Patient was sent over for direct admission from the cancer center with recurrent abdominal discomfort, nausea and vomiting associated with her underlying pancolitis.  Patient is afebrile without leukocytosis, unlikely infectious etiology.  C. difficile PCR -10/21/2018.  COVID-19/SARS-CoV-2 test negative.  Lipase 15, within normal limits. --Gastroenterology following, appreciate assistance --GI PCR panel from 10/21/2018: Pending --Colonoscopy 10/23/2018 with findings of normal appearance to the colon, no colitis, with minimal sigmoid diverticulosis.   --Follow-up pathologies from biopsies from left/right colon --Follow-up CMV/HSV --Resume diet --Supportive  care with antiemetics, analgesics  Hypokalemia Hypomagnesemia Repleted, potassium 3.6 and magnesium 2.1 this morning --Repeat electrolytes in a.m.  Pancreatic adenocarcinoma status post chemotherapy, Whipple procedure.  Follows with oncology outpatient, Dr. Burr Medico.  Recently started radiation therapy on 10/09/2018.  Recent CA 19.9 elevated to 48, which is increased from previous.  There is some concern for possible return of cancer. 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 Lyndel Safe  Procedures:  Colonoscopy 10/23/2018: Impression:                -Minimal sigmoid diverticulosis. -Otherwise normal colonoscopy to TI.  Antimicrobials:   none   Subjective: Patient seen and examined at bedside, resting comfortably.  Awaiting colonoscopy this afternoon.  Request that she gets a salad for lunch after her colonoscopy.  No other complaints or concerns at this time.  Abdominal discomfort improved.  Sisley denies headache, no fever/chills/night sweats, no nausea vomiting/diarrhea, no chest pain, no palpitations, no shortness of breath, no weakness, no fatigue, no paresthesias.  No acute events overnight per nurse staff.  Objective: Vitals:   10/23/18 1252 10/23/18 1430 10/23/18 1437 10/23/18 1459  BP: (!) 145/91 112/60 140/72 135/90  Pulse: 74 72 100 78  Resp: 13 16 17 18   Temp: 98 F (36.7 C) 98.4 F (36.9 C)  97.7 F (36.5 C)  TempSrc: Oral Temporal  Oral  SpO2: 98% 100% 98% 96%  Weight: 42.6 kg     Height: 4' 10"  (1.473 m)  Intake/Output Summary (Last 24 hours) at 10/23/2018  1643 Last data filed at 10/22/2018 2000 Gross per 24 hour  Intake 600 ml  Output -  Net 600 ml   Filed Weights   10/23/18 1252  Weight: 42.6 kg    Examination:  General exam: Appears calm and comfortable  Respiratory system: Clear to auscultation. Respiratory effort normal. Cardiovascular system: S1 & S2 heard, RRR. No JVD, murmurs, rubs, gallops or clicks. No pedal edema. Gastrointestinal system: Abdomen is nondistended, soft and nontender. No organomegaly or masses felt. Normal bowel sounds heard. Central nervous system: Alert and oriented. No focal neurological deficits. Extremities: Symmetric 5 x 5 power. Skin: No rashes, lesions or ulcers Psychiatry: Judgement and insight appear normal. Mood & affect appropriate.     Data Reviewed: I have personally reviewed following labs and imaging studies  CBC: Recent Labs  Lab 10/20/18 1257 10/21/18 0420 10/22/18 1014  WBC 5.6 3.6* 6.0  NEUTROABS  --   --  5.1  HGB 9.8* 8.7* 10.2*  HCT 30.4* 27.3* 30.8*  MCV 63.2* 63.6* 61.6*  PLT 196 159 952   Basic Metabolic Panel: Recent Labs  Lab 10/20/18 1257 10/21/18 0420 10/22/18 1014 10/23/18 0520  NA 135 137 137 134*  K 2.9* 3.2* 3.1* 3.6  CL 105 109 103 102  CO2 22 24 26  18*  GLUCOSE 88 77 94 68*  BUN 7 <5* <4* <5*  CREATININE 0.40* 0.43* 0.52 0.40*  CALCIUM 8.5* 7.9* 8.1* 8.0*  MG  --  1.6* 1.6* 2.1  PHOS  --   --   --  3.9   GFR: Estimated Creatinine Clearance: 54.3 mL/min (A) (by C-G formula based on SCr of 0.4 mg/dL (L)). Liver Function Tests: Recent Labs  Lab 10/20/18 1257 10/21/18 0420 10/22/18 1014  AST 26 18 26   ALT 24 20 24   ALKPHOS 91 71 92  BILITOT 0.8 0.6 0.6  PROT 5.9* 4.8* 6.1*  ALBUMIN 3.1* 2.5* 3.2*   No results for input(s): LIPASE, AMYLASE in the last 168 hours. No results for input(s): AMMONIA in the last 168 hours. Coagulation Profile: No results for input(s): INR, PROTIME in the last 168 hours. Cardiac Enzymes: No results for  input(s): CKTOTAL, CKMB, CKMBINDEX, TROPONINI in the last 168 hours. BNP (last 3 results) No results for input(s): PROBNP in the last 8760 hours. HbA1C: No results for input(s): HGBA1C in the last 72 hours. CBG: No results for input(s): GLUCAP in the last 168 hours. Lipid Profile: No results for input(s): CHOL, HDL, LDLCALC, TRIG, CHOLHDL, LDLDIRECT in the last 72 hours. Thyroid Function Tests: No results for input(s): TSH, T4TOTAL, FREET4, T3FREE, THYROIDAB in the last 72 hours. Anemia Panel: No results for input(s): VITAMINB12, FOLATE, FERRITIN, TIBC, IRON, RETICCTPCT in the last 72 hours. Sepsis Labs: Recent Labs  Lab 10/20/18 1257  LATICACIDVEN 1.1    Recent Results (from the past 240 hour(s))  Culture, blood (single) w Reflex to ID Panel     Status: None (Preliminary result)   Collection Time: 10/20/18 12:57 PM   Specimen: BLOOD  Result Value Ref Range Status   Specimen Description   Final    BLOOD PORTA CATH Performed at [facility] [address] [place], [place] [address]    Special Requests   Final    BOTTLES DRAWN AEROBIC AND ANAEROBIC Blood Culture adequate volume Performed at [facility] [address] [place], [place] [address]    Culture   Final    NO  GROWTH 3 DAYS Performed at [facility], [place] [address], [place], [place] [address]    Report Status PENDING  Incomplete  SARS CORONAVIRUS 2 (TAT 6-24 HRS) Nasopharyngeal Nasopharyngeal Swab     Status: None   Collection Time: 10/20/18  2:53 PM   Specimen: Nasopharyngeal Swab  Result Value Ref Range Status   SARS Coronavirus 2 NEGATIVE NEGATIVE Final    Comment: (NOTE) SARS-CoV-2 target nucleic acids are NOT DETECTED. The SARS-CoV-2 RNA is generally detectable in upper and lower respiratory specimens during the acute phase of infection. Negative results do not preclude SARS-CoV-2 infection, do not rule out co-infections with other pathogens, and should not be  used as the sole basis for treatment or other patient management decisions. Negative results must be combined with clinical observations, patient history, and epidemiological information. The expected result is Negative. Fact Sheet for Patients: SugarRoll.be Fact Sheet for Healthcare Providers: https://www.woods-mathews.com/ This test is not yet approved or cleared by the [place] FDA and  has been authorized for detection and/or diagnosis of SARS-CoV-2 by FDA under an Emergency Use Authorization (EUA). This EUA will remain  in effect (meaning this test can be used) for the duration of the COVID-19 declaration under Section 56 4(b)(1) of the Act, 21 U.S.C. section 360bbb-3(b)(1), unless the authorization is terminated or revoked sooner. Performed at [facility], [place] [address], [place], [place] [address]   C Difficile Quick Screen w PCR reflex     Status: None   Collection Time: 10/21/18 12:39 PM   Specimen: Stool  Result Value Ref Range Status   C Diff antigen NEGATIVE NEGATIVE Final   C Diff toxin NEGATIVE NEGATIVE Final   C Diff interpretation No C. difficile detected.  Final    Comment: Performed at [facility], [place] [address], [place], [place] [address]         Radiology Studies: No results found.      Scheduled Meds: . Chlorhexidine Gluconate Cloth  6 each Topical Daily  . dronabinol  2.5 mg Oral BID AC  . feeding supplement  1 Container Oral TID BM  . feeding supplement (PRO-STAT SUGAR FREE 64)  30 mL Oral BID  . gabapentin  300 mg Oral TID  . lipase/protease/amylase  36,000 Units Oral TID AC  . magnesium oxide  400 mg Oral BID  . morphine  15 mg Oral Daily  . multivitamin with minerals  1 tablet Oral Daily  . nicotine  7 mg Transdermal Daily  . pantoprazole  40 mg Oral Daily  . potassium chloride SA  20 mEq Oral BID  . sodium chloride flush  3 mL Intravenous Q12H   Continuous  Infusions:   LOS: 1 day    Time spent: 32 minutes spent on chart review, discussion with nursing staff, consultants, updating family and interview/physical exam; more than 50% of that time was spent in counseling and/or coordination of care.    Arthurine Oleary J [place] DO [facility] Pager [phone]  If 7PM-7AM, please contact night-coverage [email or web address] Password [facility] 10/23/2018, 4:43 PM

## 2018-10-23 NOTE — Progress Notes (Signed)
Jane Brooks   DOB:[birth year]   KK#:[id number]   ZJI#:[id number]  Oncology follow-up note  Subjective: Patient was admitted from my office yesterday for recurrent abdominal pain she feels better today.  I saw her around noon, and her colonoscopy was scheduled this afternoon.    Objective:  Vitals:   10/23/18 1437 10/23/18 1459  BP: 140/72 135/90  Pulse: 100 78  Resp: 17 18  Temp:  97.7 F (36.5 C)  SpO2: 98% 96%    Body mass index is 19.65 kg/m.  Intake/Output Summary (Last 24 hours) at 10/23/2018 1722 Last data filed at 10/22/2018 2000 Gross per 24 hour  Intake 600 ml  Output -  Net 600 ml     Sclerae unicteric  Oropharynx clear  No peripheral adenopathy  Lungs clear -- no rales or rhonchi  Heart regular rate and rhythm  Abdomen soft and nontender   CBG (last 3)  No results for input(s): GLUCAP in the last 72 hours.   Labs:  Urine Studies No results for input(s): UHGB, CRYS in the last 72 hours.  Invalid input(s): UACOL, UAPR, USPG, UPH, UTP, UGL, UKET, UBIL, UNIT, UROB, ULEU, UEPI, UWBC, URBC, [place], [place]  Basic Metabolic Panel: Recent Labs  Lab 10/20/18 1257 10/21/18 0420 10/22/18 1014 10/23/18 0520  NA 135 137 137 134*  K 2.9* 3.2* 3.1* 3.6  CL 105 109 103 102  CO2 22 24 26  18*  GLUCOSE 88 77 94 68*  BUN 7 <5* <4* <5*  CREATININE 0.40* 0.43* 0.52 0.40*  CALCIUM 8.5* 7.9* 8.1* 8.0*  MG  --  1.6* 1.6* 2.1  PHOS  --   --   --  3.9   GFR Estimated Creatinine Clearance: 54.3 mL/min (A) (by C-G formula based on SCr of 0.4 mg/dL (L)). Liver Function Tests: Recent Labs  Lab 10/20/18 1257 10/21/18 0420 10/22/18 1014  AST 26 18 26   ALT 24 20 24   ALKPHOS 91 71 92  BILITOT 0.8 0.6 0.6  PROT 5.9* 4.8* 6.1*  ALBUMIN 3.1* 2.5* 3.2*   No results for input(s): LIPASE, AMYLASE in the last 168 hours. No results for input(s): AMMONIA in the last 168 hours. Coagulation profile No results for input(s): INR, PROTIME in the last 168  hours.  CBC: Recent Labs  Lab 10/20/18 1257 10/21/18 0420 10/22/18 1014  WBC 5.6 3.6* 6.0  NEUTROABS  --   --  5.1  HGB 9.8* 8.7* 10.2*  HCT 30.4* 27.3* 30.8*  MCV 63.2* 63.6* 61.6*  PLT 196 159 211   Cardiac Enzymes: No results for input(s): CKTOTAL, CKMB, CKMBINDEX, TROPONINI in the last 168 hours. BNP: Invalid input(s): POCBNP CBG: No results for input(s): GLUCAP in the last 168 hours. D-Dimer No results for input(s): DDIMER in the last 72 hours. Hgb A1c No results for input(s): HGBA1C in the last 72 hours. Lipid Profile No results for input(s): CHOL, HDL, LDLCALC, TRIG, CHOLHDL, LDLDIRECT in the last 72 hours. Thyroid function studies No results for input(s): TSH, T4TOTAL, T3FREE, THYROIDAB in the last 72 hours.  Invalid input(s): FREET3 Anemia work up No results for input(s): VITAMINB12, FOLATE, FERRITIN, TIBC, IRON, RETICCTPCT in the last 72 hours. Microbiology Recent Results (from the past 240 hour(s))  Culture, blood (single) w Reflex to ID Panel     Status: None (Preliminary result)   Collection Time: 10/20/18 12:57 PM   Specimen: BLOOD  Result Value Ref Range Status   Specimen Description   Final    BLOOD PORTA CATH Performed  at [facility], [place] [address], [place], [place] [address]    Special Requests   Final    BOTTLES DRAWN AEROBIC AND ANAEROBIC Blood Culture adequate volume Performed at [facility] [address] [place], [place] [address]    Culture   Final    NO GROWTH 3 DAYS Performed at [facility], [place] [address] [place], [place] [address]    Report Status PENDING  Incomplete  SARS CORONAVIRUS 2 (TAT 6-24 HRS) Nasopharyngeal Nasopharyngeal Swab     Status: None   Collection Time: 10/20/18  2:53 PM   Specimen: Nasopharyngeal Swab  Result Value Ref Range Status   SARS Coronavirus 2 NEGATIVE NEGATIVE Final    Comment: (NOTE) SARS-CoV-2 target nucleic acids are NOT DETECTED. The SARS-CoV-2 RNA  is generally detectable in upper and lower respiratory specimens during the acute phase of infection. Negative results do not preclude SARS-CoV-2 infection, do not rule out co-infections with other pathogens, and should not be used as the sole basis for treatment or other patient management decisions. Negative results must be combined with clinical observations, patient history, and epidemiological information. The expected result is Negative. Fact Sheet for Patients: SugarRoll.be Fact Sheet for Healthcare Providers: https://www.woods-mathews.com/ This test is not yet approved or cleared by the [place] FDA and  has been authorized for detection and/or diagnosis of SARS-CoV-2 by FDA under an Emergency Use Authorization (EUA). This EUA will remain  in effect (meaning this test can be used) for the duration of the COVID-19 declaration under Section 56 4(b)(1) of the Act, 21 U.S.C. section 360bbb-3(b)(1), unless the authorization is terminated or revoked sooner. Performed at [facility], [place] [address] [place], [place] [address]   C Difficile Quick Screen w PCR reflex     Status: None   Collection Time: 10/21/18 12:39 PM   Specimen: Stool  Result Value Ref Range Status   C Diff antigen NEGATIVE NEGATIVE Final   C Diff toxin NEGATIVE NEGATIVE Final   C Diff interpretation No C. difficile detected.  Final    Comment: Performed at [facility], [place] [address] [place],  [address]      Studies:  No results found.  Assessment: 48 y.o.  AAF  1.   Recurrent abdominal pain with nausea and vomiting, improvedPancreatic  2.  Pancolitis 3. adenocarcinoma, cT2 N0 M0, stage Ib, status post chemo and Whipple surgery 4.  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  Decreased appetite and weight loss 6.  Hypokalemia, resolved   Plan:  -Patient has had recurrent  abdominal pain, nausea, CT evidence of pancolitis with unknown etiology.   -She underwent colonoscopy this afternoon, after I saw her.  It was unremarkable, no evidence of colitis. Biopsy results pending  -I have been concerned about her pancreatic cancer recurrence, especially with peritoneal metastasis, which can cause abdominal pain, nausea, and colitis like CT appearing. However several images including PET on 07/25/2018 and liver biopsy on 08/13/2018 have not confirmed cancer recurrence. -will continue symptom management.  I have scheduled her to get IV fluids, nausea and pain medication as needed weekly in our clinic.  -will repeat PET next month (she just had CT recent at [facility]) for f/u     Truitt Merle, MD 10/23/2018  5:22 PM

## 2018-10-23 NOTE — Telephone Encounter (Signed)
I told her that I will refill as soon as she leaves the hospital. I am not able to e-prescribe when she is admitted.   Truitt Merle MD

## 2018-10-23 NOTE — Op Note (Signed)
[facility] Patient Name: Jane Brooks Procedure Date: 10/23/2018 MRN: [id number] Attending MD: [name] , MD Date of Birth: [birth year] CSN: [id number] Age: 48 Admit Type: Inpatient Procedure:                Colonoscopy Indications:              ? Acute, recurrent colitis (on 3 CT scans since May                            showing pancolitis). Neg C. Diff. Neg COVID-19, not                            neutropenic. Providers:                [name], MD, Angus Seller, William Dalton,                            Technician Referring MD:              Medicines:                Monitored Anesthesia Care Complications:            No immediate complications. Estimated Blood Loss:     Estimated blood loss: none. Procedure:                Pre-Anesthesia Assessment:                           - Prior to the procedure, a History and Physical                            was performed, and patient medications and                            allergies were reviewed. The patient's tolerance of                            previous anesthesia was also reviewed. The risks                            and benefits of the procedure and the sedation                            options and risks were discussed with the patient.                            All questions were answered, and informed consent                            was obtained. Prior Anticoagulants: The patient has                            taken no previous anticoagulant or antiplatelet                            agents. ASA Grade  Assessment: III - A patient with                            severe systemic disease. After reviewing the risks                            and benefits, the patient was deemed in                            satisfactory condition to undergo the procedure.                           After obtaining informed consent, the colonoscope                            was passed under direct vision.  Throughout the                            procedure, the patient's blood pressure, pulse, and                            oxygen saturations were monitored continuously. The                            [id number]-[id number] ([id number]) Olympus pediatric colonscope                            was introduced through the anus and advanced to the                            2 cm into the ileum. The colonoscopy was performed                            without difficulty. The patient tolerated the                            procedure well. The quality of the bowel                            preparation was adequate to identify polyps. The                            terminal ileum, ileocecal valve, appendiceal                            orifice, and rectum were photographed. Scope In: 2:08:29 PM Scope Out: 2:22:43 PM Scope Withdrawal Time: 0 hours 9 minutes 49 seconds  Total Procedure Duration: 0 hours 14 minutes 14 seconds  Findings:      The colon (entire examined portion) appeared normal with well preserved       vascular pattern. No colitis. Biopsies were taken with a cold forceps       for histology from right colon and left colon. Pathology was also       instructed to stain for CMV and  HSV.      A few rare small-mouthed diverticula were found in the sigmoid colon.      Non-bleeding internal hemorrhoids were found during retroflexion. The       hemorrhoids were small.      The terminal ileum appeared normal. Biopsies were taken with a cold       forceps for histology.      The exam was otherwise without abnormality on direct and retroflexion       views. Impression:               -Minimal sigmoid diverticulosis.                           -Otherwise normal colonoscopy to TI. Moderate Sedation:      Not Applicable - Patient had care per Anesthesia. Recommendation:           - Patient has a contact number available for                            emergencies. The signs and symptoms of potential                             delayed complications were discussed with the                            patient. Return to normal activities tomorrow.                           - Resume previous diet.                           - Continue present medications.                           - Await pathology results.                           - Return to GI clinic in 4 weeks. Procedure Code(s):        --- Professional ---                           [phone], Colonoscopy, flexible; with biopsy, single                            or multiple Diagnosis Code(s):        --- Professional ---                           K64.8, Other hemorrhoids                           K52.9, Noninfective gastroenteritis and colitis,                            unspecified                           K57.30, Diverticulosis of large intestine without  perforation or abscess without bleeding CPT copyright 2019 American Medical Association. All rights reserved. The codes documented in this report are preliminary and upon coder review may  be revised to meet current compliance requirements. [name], MD 10/23/2018 2:30:46 PM This report has been signed electronically. Number of Addenda: 0

## 2018-10-23 NOTE — Progress Notes (Signed)
Initial Nutrition Assessment  RD working remotely.   DOCUMENTATION CODES:   (unable to assess for malnutrition at this time)  INTERVENTION:  - diet advancement as medically feasible. - will order Boost Breeze TID for once diet advanced to at least CLD, each supplement provides 250 kcal and 9 grams of protein. - will order 30 mL Prostat BID for once diet advanced to at least CLD, each supplement provides 100 kcal and 15 grams of protein. - will order daily multivitamin with minerals. - will monitor for need for/need to recommend nutrition support.    NUTRITION DIAGNOSIS:   Increased nutrient needs related to cancer and cancer related treatments, chronic illness, acute illness as evidenced by estimated needs.  GOAL:   Patient will meet greater than or equal to 90% of their needs  MONITOR:   Diet advancement, PO intake, Supplement acceptance, Labs, Weight trends  REASON FOR ASSESSMENT:   Malnutrition Screening Tool  ASSESSMENT:   48 year old female with medical history of pancreatic cancer s/p Whipple procedure and chemotherapy, currently undergoing radiation therapy. She presented to the [facility] on 10/6 for follow-up and was transferred to [facility] as a direct admission for re-current abdominal pain, vomiting, and dx of pancolitis. She was admitted to the hospital 10/4-10/5 with the same diagnosis during which time she was seen by GI and a colonoscopy was recommended but the patient declined in favor of having this done outpatient. No antibiotics were prescribed at the time of discharge. After returning home, she had recurrent and worsening abdominal pain, N/V, and inability to take oral meds.  CLD ordered yesterday at 28 and then changed to NPO at midnight. Patient is currently off the floor to radiation. Patient was last assessed by a [place] RD on 6/1. At that time, patient had reported to eating and drinking well at home until a few days prior to that admission  when she began to have N/V when experiencing or needing to have a BM.   She has hx of Whipple but does not have a G-tube or J-tube currently. Per chart review, current weight is 94 lb and weight has been stable over the past 3 months. Weight on 7/23 was recorded as 101 lb. This indicates 7 lb weight loss (7% body weight) in the past 3.5 months; not significant for time frame. Patient is at high risk for malnutrition given recurrent admissions for colitis with associated periods of N/V/D and inability to consume adequate nutrition.   Noted that patient is ordered marinol BID.   Per notes: - ongoing pancolitis with generalized abdominal pain, N/V--GI following and recommended inpatient colonoscopy (likely to undergo today) - hypokalemia and hypomagnesemia--resolved after repletion - pancreatic adenocarcinoma s/p chemo and whipple and started radiation 9/23 --microcytic anemia   Labs reviewed; Na: 134 mmol/l, BUN: <5 mg/dl, creatinine: 0.4 mg/dl, Ca: 8 mg/dl. Medications reviewed; 2.5 mg marinol BID, 67893 units creon TID, 400 mg mag-ox BID, 2 g IV Mg sulfate x1 run 10/6, 10 mEq IV KCl x4 runs 10/6, 20 mEq Klor-Con BID.      NUTRITION - FOCUSED PHYSICAL EXAM:  unable to complete at this time.   Diet Order:   Diet Order            Diet NPO time specified  Diet effective midnight              EDUCATION NEEDS:   Not appropriate for education at this time  Skin:  Skin Assessment: Reviewed RN Assessment  Last BM:  10/6  Height:   Ht Readings from Last 1 Encounters:  10/22/18 4' 10"  (1.473 m)    Weight:   Wt Readings from Last 1 Encounters:  10/22/18 42.7 kg    Ideal Body Weight:  42.2 kg  BMI:  Body mass index is 19.67 kg/m.  Estimated Nutritional Needs:   Kcal:  1625-1840 kcal (38-43 kcal/kg)  Protein:  73-85 grams (1.7-2 grams/kg)  Fluid:  >/= 1.8 L/day      Jarome Matin, MS, RD, LDN, [facility] Inpatient Clinical Dietitian Pager # [phone] After  hours/weekend pager # [phone]

## 2018-10-23 NOTE — Anesthesia Preprocedure Evaluation (Signed)
Anesthesia Evaluation  Patient identified by MRN, date of birth, ID band Patient awake    Reviewed: Allergy & Precautions, NPO status , Patient's Chart, lab work & pertinent test results  Airway Mallampati: II  TM Distance: >3 FB Neck ROM: Full    Dental no notable dental hx.    Pulmonary Current Smoker,    Pulmonary exam normal breath sounds clear to auscultation       Cardiovascular negative cardio ROS Normal cardiovascular exam Rhythm:Regular Rate:Normal     Neuro/Psych negative neurological ROS  negative psych ROS   GI/Hepatic negative GI ROS, Neg liver ROS,   Endo/Other  negative endocrine ROS  Renal/GU negative Renal ROS  negative genitourinary   Musculoskeletal negative musculoskeletal ROS (+)   Abdominal   Peds negative pediatric ROS (+)  Hematology negative hematology ROS (+)   Anesthesia Other Findings   Reproductive/Obstetrics negative OB ROS                             Anesthesia Physical Anesthesia Plan  ASA: II  Anesthesia Plan: General   Post-op Pain Management:    Induction: Intravenous  PONV Risk Score and Plan: 2 and Treatment may vary due to age or medical condition  Airway Management Planned: Simple Face Mask and Nasal Cannula  Additional Equipment:   Intra-op Plan:   Post-operative Plan:   Informed Consent: I have reviewed the patients History and Physical, chart, labs and discussed the procedure including the risks, benefits and alternatives for the proposed anesthesia with the patient or authorized representative who has indicated his/her understanding and acceptance.     Dental advisory given  Plan Discussed with: CRNA  Anesthesia Plan Comments:         Anesthesia Quick Evaluation

## 2018-10-23 NOTE — Transfer of Care (Signed)
Immediate Anesthesia Transfer of Care Note  Patient: Jane Brooks  Procedure(s) Performed: Procedure(s): COLONOSCOPY WITH PROPOFOL (N/A) BIOPSY  Patient Location: PACU  Anesthesia Type:MAC  Level of Consciousness:  sedated, patient cooperative and responds to stimulation  Airway & Oxygen Therapy:Patient Spontanous Breathing and Patient connected to face mask oxgen  Post-op Assessment:  Report given to PACU RN and Post -op Vital signs reviewed and stable  Post vital signs:  Reviewed and stable  Last Vitals:  Vitals:   10/23/18 0517 10/23/18 1252  BP: (!) 144/92 (!) 145/91  Pulse: 81 74  Resp: 17 13  Temp: 36.7 C 36.7 C  SpO2: 737% 10%    Complications: No apparent anesthesia complications

## 2018-10-23 NOTE — Telephone Encounter (Signed)
Patient left voicemail requesting refill on short acting morphine prescription. She requested that it be filled for a 30 day supply since her financial grant ran out (but if Dr. Burr Medico can only write it in 15 day supply she understands and is agreeable).   Will pass message along and call patient back with update.

## 2018-10-24 ENCOUNTER — Telehealth: Payer: Self-pay

## 2018-10-24 ENCOUNTER — Other Ambulatory Visit: Payer: Self-pay

## 2018-10-24 ENCOUNTER — Other Ambulatory Visit: Payer: Self-pay | Admitting: Hematology

## 2018-10-24 ENCOUNTER — Encounter (HOSPITAL_COMMUNITY): Payer: Self-pay | Admitting: Gastroenterology

## 2018-10-24 ENCOUNTER — Ambulatory Visit
Admission: RE | Admit: 2018-10-24 | Discharge: 2018-10-24 | Disposition: A | Discharge status: Home / Self Care | Payer: Medicaid Other | Source: Ambulatory Visit | Attending: Radiation Oncology | Admitting: Radiation Oncology

## 2018-10-24 LAB — GI PATHOGEN PANEL BY PCR, STOOL

## 2018-10-24 LAB — BASIC METABOLIC PANEL
Anion gap: 8 (ref 5–15)
BUN: <5 mg/dL — ABNORMAL LOW (ref 6–20)
CO2: 25 mmol/L (ref 22–32)
Calcium: 7.8 mg/dL — ABNORMAL LOW (ref 8.9–10.3)
Chloride: 99 mmol/L (ref 98–111)
Creatinine, Ser: 0.46 mg/dL (ref 0.44–1.00)
GFR calc Af Amer: >60 mL/min (ref >60)
GFR calc non Af Amer: >60 mL/min (ref >60)
Glucose, Bld: 72 mg/dL (ref 70–99)
Potassium: 3.9 mmol/L (ref 3.5–5.1)
Sodium: 132 mmol/L — ABNORMAL LOW (ref 135–145)

## 2018-10-24 LAB — CBC
HCT: 28.1 % — ABNORMAL LOW (ref 36.0–46.0)
Hemoglobin: 8.8 g/dL — ABNORMAL LOW (ref 12.0–15.0)
MCH: 19.9 pg — ABNORMAL LOW (ref 26.0–34.0)
MCHC: 31.3 g/dL (ref 30.0–36.0)
MCV: 63.6 fL — ABNORMAL LOW (ref 80.0–100.0)
Platelets: 154 10*3/uL (ref 150–400)
RBC: 4.42 MIL/uL (ref 3.87–5.11)
RDW: 18.7 % — ABNORMAL HIGH (ref 11.5–15.5)
WBC: 4 10*3/uL (ref 4.0–10.5)
nRBC: 0 % (ref 0.0–0.2)

## 2018-10-24 LAB — MAGNESIUM: Magnesium: 1.8 mg/dL (ref 1.7–2.4)

## 2018-10-24 MED ORDER — MORPHINE SULFATE 15 MG PO TABS: 15.0000 mg | ORAL_TABLET | Freq: Four times a day (QID) | ORAL | 0 refills | Status: DC | PRN

## 2018-10-24 MED ORDER — HEPARIN SOD (PORK) LOCK FLUSH 100 UNIT/ML IV SOLN
500.0000 [IU] | Freq: Once | INTRAVENOUS | Status: AC
  Administered 2018-10-24: 500 [IU] via INTRAVENOUS
  Filled 2018-10-24: qty 5

## 2018-10-24 MED FILL — CREON 36000 UNIT CPEP: 36000 | 90 days supply | Qty: 270 | Fill #0

## 2018-10-24 MED FILL — MORPHINE SULFATE IR 15 MG T: 15 | 22 days supply | Qty: 90 | Fill #0

## 2018-10-24 NOTE — Anesthesia Postprocedure Evaluation (Signed)
Anesthesia Post Note  Patient: Jane Brooks  Procedure(s) Performed: COLONOSCOPY WITH PROPOFOL (N/A ) BIOPSY     Patient location during evaluation: PACU Anesthesia Type: MAC Level of consciousness: awake and alert Pain management: pain level controlled Vital Signs Assessment: post-procedure vital signs reviewed and stable Respiratory status: spontaneous breathing, nonlabored ventilation, respiratory function stable and patient connected to nasal cannula oxygen Cardiovascular status: stable and blood pressure returned to baseline Postop Assessment: no apparent nausea or vomiting Anesthetic complications: no    Last Vitals:  Vitals:   10/23/18 1459 10/24/18 0238  BP: 135/90 (!) 143/82  Pulse: 78 79  Resp: 18 17  Temp: 36.5 C 36.7 C  SpO2: 96% 100%    Last Pain:  Vitals:   10/24/18 0530  TempSrc:   PainSc: 4                  Montez Hageman

## 2018-10-24 NOTE — Progress Notes (Signed)
Patient discharged to home, discharge instructions reviewed with patient who verbalized understanding.

## 2018-10-24 NOTE — Telephone Encounter (Signed)
Called patient to let her know Dr. Burr Medico refilled morphine prescription. Patient denied any other needs at this time and knows to call our office if that changes.

## 2018-10-24 NOTE — Discharge Summary (Signed)
Physician Discharge Summary  Adeena Bernabe MWU:[id number] DOB: 03-08-1970 DOA: 10/22/2018  PCP: Patient, No Pcp Per  Admit date: 10/22/2018 Discharge date: 10/24/2018  Admitted From: Home Disposition:  Home  Recommendations for Outpatient Follow-up:  1. Follow up with PCP in 1 week 2. Follow-up with oncology, Dr. Burr Medico as scheduled 3. Please follow up on the following pending results: Pathology from colonoscopy biopsy  Home Health: No Equipment/Devices: None  Discharge Condition: Stable CODE STATUS: Full code Diet recommendation: Regular diet  History of present illness:  Jane [place] is a 48 year old woman PMHx pancreatic cancer status post Whipple procedure and chemotherapy, currently undergoing radiation therapy who initially presented to be cancer Center today for follow-up and was accepted as a direct admission for recurrent abdominal pain and vomiting, associated with pancolitis.  Patient was just admitted 10/4 with the same presentation and discharged 10/5 after being seen by gastroenterology. Colonoscopy was recommended but the patient requested to defer this to the outpatient setting. She was discharged on no antibiotics as it is not clear that this is infectious.  She reports after being at home she developed recurrent and worsening abdominal pain, nausea and vomiting and was unable to take medications by mouth. No specific aggravating or alleviating factors. No fever or associated symptoms noted.  Hospital course:  Persistent pancolitis with generalized abdominal pain, nausea and vomiting Patient was sent over for direct admission from the cancer center with recurrent abdominal discomfort, nausea and vomiting associated with her underlying pancolitis.  Patient is afebrile without leukocytosis, unlikely infectious etiology.  C. difficile PCR -10/21/2018.  COVID-19/SARS-CoV-2 test negative.  Lipase 15, within normal limits.  Gastroenterology was consulted and patient  underwent colonoscopy on 10/23/2018 with findings of a normal appearance of the colon, no colitis with minimal sigmoid diverticulosis.  Biopsies from left and right colon were performed with CMV/HSV serologies that were pending at time of discharge.  Patient symptoms have completely resolved.  Tolerating diet without any further nausea or abdominal discomfort.  Patient ready for discharge home and will follow up with oncology outpatient as scheduled.  Hypokalemia Hypomagnesemia Repleted during hospitalization.  Potassium 3.9 at time of discharge.  Pancreatic adenocarcinoma status post chemotherapy, Whipple procedure.  Follows with oncology outpatient, Dr. Burr Medico.  Recently started radiation therapy on 10/09/2018.  Recent CA 19.9 elevated to 48, which is increased from previous.  There is some concern for possible return of cancer.  Oncology plans to repeat PET scan in 1 month.  Continue outpatient follow-up as scheduled.  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  Discharge Diagnoses:  Principal Problem:   Pancolitis ([place]) Active Problems:   Pancreatic adenocarcinoma ([place])   Microcytic anemia    Discharge Instructions  Discharge Instructions    Call MD for:  difficulty breathing, headache or visual disturbances   Complete by: As directed    Call MD for:  extreme fatigue   Complete by: As directed    Call MD for:  persistant dizziness or light-headedness   Complete by: As directed    Call MD for:  persistant nausea and vomiting   Complete by: As directed    Call MD for:  severe uncontrolled pain   Complete by: As directed    Call MD for:  temperature >100.4   Complete by: As directed    Diet - low sodium heart healthy   Complete by: As directed    Increase activity slowly   Complete by: As directed  Allergies as of 10/24/2018      Reactions   Latex Hives      Medication List    STOP taking these  medications   acetaminophen 325 MG tablet Commonly known as: TYLENOL   magnesium oxide 400 (241.3 Mg) MG tablet Commonly known as: MAG-OX   ondansetron 8 MG tablet Commonly known as: ZOFRAN     TAKE these medications   Creon 36000 UNITS Cpep capsule Generic drug: lipase/protease/amylase Take 36,000 Units by mouth 3 (three) times daily before meals.   cyclobenzaprine 7.5 MG tablet Commonly known as: FEXMID Take 1 tablet (7.5 mg total) by mouth 3 (three) times daily as needed for muscle spasms.   diphenoxylate-atropine 2.5-0.025 MG tablet Commonly known as: LOMOTIL Take 1 to 2 tablets PO QID prn diarrhea   dronabinol 2.5 MG capsule Commonly known as: MARINOL Take 1 capsule (2.5 mg total) by mouth 2 (two) times daily before lunch and supper.   gabapentin 300 MG capsule Commonly known as: NEURONTIN Take 1 capsule (300 mg total) by mouth 3 (three) times daily.   metoCLOPramide 5 MG tablet Commonly known as: REGLAN Take 1 tablet (5 mg total) by mouth every 6 (six) hours as needed for nausea or vomiting.   morphine 15 MG 12 hr tablet Commonly known as: MS CONTIN Take 1 tablet (15 mg total) by mouth daily. Plan to wean off in the next month   morphine 15 MG tablet Commonly known as: MSIR Take 1 tablet (15 mg total) by mouth every 6 (six) hours as needed for severe pain.   ondansetron 8 MG disintegrating tablet Commonly known as: Zofran ODT Take 1 tablet (8 mg total) by mouth every 8 (eight) hours as needed for nausea or vomiting.   pantoprazole 40 MG tablet Commonly known as: PROTONIX Take 1 tablet (40 mg total) by mouth daily.   potassium chloride SA 20 MEQ tablet Commonly known as: KLOR-CON Take 1 tablet (20 mEq total) by mouth 2 (two) times daily for 5 days.   promethazine 25 MG tablet Commonly known as: PHENERGAN Take 1 tablet (25 mg total) by mouth every 6 (six) hours as needed for nausea or vomiting.      Follow-up Information    Truitt Merle, MD. Schedule an  appointment as soon as possible for a visit in 1 week(s).   Specialties: Hematology, Oncology Contact information: [address] [place] [address] [phone]          Allergies  Allergen Reactions  . Latex Hives    Consultations:  [facility] - Dr. Lyndel Safe   Procedures/Studies:  No results found.  Colonoscopy 10/23/2018 Impression:  -Minimal sigmoid diverticulosis. -Otherwise normal colonoscopy to TI.  Subjective: Patient seen and examined at bedside, resting comfortably.  Symptoms of nausea/vomiting and abdominal pain have completely resolved.  Tolerating diet.  Requesting to discharge home early this morning.  Denies headache, no fever/chills/night sweats, no nausea/vomiting/diarrhea, no chest pain, no palpitations, no shortness of breath, no abdominal pain, no weakness, no fatigue, no paresthesias.  No acute events overnight per nursing staff.  Discharge Exam: Vitals:   10/23/18 1459 10/24/18 0238  BP: 135/90 (!) 143/82  Pulse: 78 79  Resp: 18 17  Temp: 97.7 F (36.5 C) 98 F (36.7 C)  SpO2: 96% 100%   Vitals:   10/23/18 1430 10/23/18 1437 10/23/18 1459 10/24/18 0238  BP: 112/60 140/72 135/90 (!) 143/82  Pulse: 72 100 78 79  Resp: 16 17 18 17   Temp: 98.4 F (  36.9 C)  97.7 F (36.5 C) 98 F (36.7 C)  TempSrc: Temporal  Oral Oral  SpO2: 100% 98% 96% 100%  Weight:      Height:        General: Pt is alert, awake, not in acute distress Cardiovascular: RRR, S1/S2 +, no rubs, no gallops Respiratory: CTA bilaterally, no wheezing, no rhonchi Abdominal: Soft, NT, ND, bowel sounds + Extremities: no edema, no cyanosis    The results of significant diagnostics from this hospitalization (including imaging, microbiology, ancillary and laboratory) are listed below for reference.     Microbiology: Recent Results (from the past 240 hour(s))  Culture, blood (single) w Reflex to ID Panel     Status: None (Preliminary result)    Collection Time: 10/20/18 12:57 PM   Specimen: BLOOD  Result Value Ref Range Status   Specimen Description   Final    BLOOD PORTA CATH Performed at [facility] [address] [place], [place] [address]    Special Requests   Final    BOTTLES DRAWN AEROBIC AND ANAEROBIC Blood Culture adequate volume Performed at [facility] [address], [place], [place] [address]    Culture   Final    NO GROWTH 3 DAYS Performed at [facility], [place] [address] [place], [place] [address]    Report Status PENDING  Incomplete  SARS CORONAVIRUS 2 (TAT 6-24 HRS) Nasopharyngeal Nasopharyngeal Swab     Status: None   Collection Time: 10/20/18  2:53 PM   Specimen: Nasopharyngeal Swab  Result Value Ref Range Status   SARS Coronavirus 2 NEGATIVE NEGATIVE Final    Comment: (NOTE) SARS-CoV-2 target nucleic acids are NOT DETECTED. The SARS-CoV-2 RNA is generally detectable in upper and lower respiratory specimens during the acute phase of infection. Negative results do not preclude SARS-CoV-2 infection, do not rule out co-infections with other pathogens, and should not be used as the sole basis for treatment or other patient management decisions. Negative results must be combined with clinical observations, patient history, and epidemiological information. The expected result is Negative. Fact Sheet for Patients: SugarRoll.be Fact Sheet for Healthcare Providers: https://www.woods-mathews.com/ This test is not yet approved or cleared by the [place] FDA and  has been authorized for detection and/or diagnosis of SARS-CoV-2 by FDA under an Emergency Use Authorization (EUA). This EUA will remain  in effect (meaning this test can be used) for the duration of the COVID-19 declaration under Section 56 4(b)(1) of the Act, 21 U.S.C. section 360bbb-3(b)(1), unless the authorization is terminated or revoked  sooner. Performed at [facility], [place] [address], [place], [place] [address]   GI pathogen panel by PCR, stool     Status: None   Collection Time: 10/21/18 12:39 PM   Specimen: Stool  Result Value Ref Range Status   Plesiomonas shigelloides NOT DETECTED NOT DETECTED Final   Yersinia enterocolitica NOT DETECTED NOT DETECTED Final   Vibrio NOT DETECTED NOT DETECTED Final   Enteropathogenic E coli NOT DETECTED NOT DETECTED Final   E coli (ETEC) LT/ST NOT DETECTED NOT DETECTED Final   E coli 5638 by PCR Not applicable NOT DETECTED Final   Cryptosporidium by PCR NOT DETECTED NOT DETECTED Final   Entamoeba histolytica NOT DETECTED NOT DETECTED Final   Adenovirus F 40/41 NOT DETECTED NOT DETECTED Final   Norovirus GI/GII NOT DETECTED NOT DETECTED Final   Sapovirus NOT DETECTED NOT DETECTED Final    Comment: (NOTE) Performed At: [facility]  [place], [place] [id number] Rush Farmer MD CV:[id number]    Vibrio cholerae NOT DETECTED NOT DETECTED Final   Campylobacter by PCR NOT DETECTED NOT DETECTED Final   Salmonella by PCR NOT DETECTED NOT DETECTED Final   E coli (STEC) NOT DETECTED NOT DETECTED Final   Enteroaggregative E coli NOT DETECTED NOT DETECTED Final   Shigella by PCR NOT DETECTED NOT DETECTED Final   Cyclospora cayetanensis NOT DETECTED NOT DETECTED Final   Astrovirus NOT DETECTED NOT DETECTED Final   G lamblia by PCR NOT DETECTED NOT DETECTED Final   Rotavirus A by PCR NOT DETECTED NOT DETECTED Final  C Difficile Quick Screen w PCR reflex     Status: None   Collection Time: 10/21/18 12:39 PM   Specimen: Stool  Result Value Ref Range Status   C Diff antigen NEGATIVE NEGATIVE Final   C Diff toxin NEGATIVE NEGATIVE Final   C Diff interpretation No C. difficile detected.  Final    Comment: Performed at [facility], [place] [address] [place], [place] [address]     Labs: BNP (last 3 results) No results for input(s): BNP in  the last 8760 hours. Basic Metabolic Panel: Recent Labs  Lab 10/20/18 1257 10/21/18 0420 10/22/18 1014 10/23/18 0520 10/24/18 0540  NA 135 137 137 134* 132*  K 2.9* 3.2* 3.1* 3.6 3.9  CL 105 109 103 102 99  CO2 22 24 26  18* 25  GLUCOSE 88 77 94 68* 72  BUN 7 <5* <4* <5* <5*  CREATININE 0.40* 0.43* 0.52 0.40* 0.46  CALCIUM 8.5* 7.9* 8.1* 8.0* 7.8*  MG  --  1.6* 1.6* 2.1 1.8  PHOS  --   --   --  3.9  --    Liver Function Tests: Recent Labs  Lab 10/20/18 1257 10/21/18 0420 10/22/18 1014  AST 26 18 26   ALT 24 20 24   ALKPHOS 91 71 92  BILITOT 0.8 0.6 0.6  PROT 5.9* 4.8* 6.1*  ALBUMIN 3.1* 2.5* 3.2*   No results for input(s): LIPASE, AMYLASE in the last 168 hours. No results for input(s): AMMONIA in the last 168 hours. CBC: Recent Labs  Lab 10/20/18 1257 10/21/18 0420 10/22/18 1014 10/24/18 0540  WBC 5.6 3.6* 6.0 4.0  NEUTROABS  --   --  5.1  --   HGB 9.8* 8.7* 10.2* 8.8*  HCT 30.4* 27.3* 30.8* 28.1*  MCV 63.2* 63.6* 61.6* 63.6*  PLT 196 159 211 154   Cardiac Enzymes: No results for input(s): CKTOTAL, CKMB, CKMBINDEX, TROPONINI in the last 168 hours. BNP: Invalid input(s): POCBNP CBG: No results for input(s): GLUCAP in the last 168 hours. D-Dimer No results for input(s): DDIMER in the last 72 hours. Hgb A1c No results for input(s): HGBA1C in the last 72 hours. Lipid Profile No results for input(s): CHOL, HDL, LDLCALC, TRIG, CHOLHDL, LDLDIRECT in the last 72 hours. Thyroid function studies No results for input(s): TSH, T4TOTAL, T3FREE, THYROIDAB in the last 72 hours.  Invalid input(s): FREET3 Anemia work up No results for input(s): VITAMINB12, FOLATE, FERRITIN, TIBC, IRON, RETICCTPCT in the last 72 hours. Urinalysis    Component Value Date/Time   COLORURINE YELLOW 09/23/2018 0251   APPEARANCEUR HAZY (A) 09/23/2018 0251   LABSPEC 1.018 09/23/2018 0251   PHURINE 5.0 09/23/2018 [place] 09/23/2018 0251   HGBUR NEGATIVE 09/23/2018 [place] NEGATIVE 09/23/2018 [place] 09/23/2018 0251   PROTEINUR NEGATIVE 09/23/2018 0251  NITRITE NEGATIVE 09/23/2018 0251   LEUKOCYTESUR NEGATIVE 09/23/2018 0251   Sepsis Labs Invalid input(s): PROCALCITONIN,  WBC,  LACTICIDVEN Microbiology Recent Results (from the past 240 hour(s))  Culture, blood (single) w Reflex to ID Panel     Status: None (Preliminary result)   Collection Time: 10/20/18 12:57 PM   Specimen: BLOOD  Result Value Ref Range Status   Specimen Description   Final    BLOOD PORTA CATH Performed at [facility] [address], [place], [place] [address]    Special Requests   Final    BOTTLES DRAWN AEROBIC AND ANAEROBIC Blood Culture adequate volume Performed at [facility] [address], [place], [place] [address]    Culture   Final    NO GROWTH 3 DAYS Performed at [facility], [place] [address] [place], [place] [address]    Report Status PENDING  Incomplete  SARS CORONAVIRUS 2 (TAT 6-24 HRS) Nasopharyngeal Nasopharyngeal Swab     Status: None   Collection Time: 10/20/18  2:53 PM   Specimen: Nasopharyngeal Swab  Result Value Ref Range Status   SARS Coronavirus 2 NEGATIVE NEGATIVE Final    Comment: (NOTE) SARS-CoV-2 target nucleic acids are NOT DETECTED. The SARS-CoV-2 RNA is generally detectable in upper and lower respiratory specimens during the acute phase of infection. Negative results do not preclude SARS-CoV-2 infection, do not rule out co-infections with other pathogens, and should not be used as the sole basis for treatment or other patient management decisions. Negative results must be combined with clinical observations, patient history, and epidemiological information. The expected result is Negative. Fact Sheet for Patients: SugarRoll.be Fact Sheet for Healthcare Providers: https://www.woods-mathews.com/ This test is not yet approved  or cleared by the [place] FDA and  has been authorized for detection and/or diagnosis of SARS-CoV-2 by FDA under an Emergency Use Authorization (EUA). This EUA will remain  in effect (meaning this test can be used) for the duration of the COVID-19 declaration under Section 56 4(b)(1) of the Act, 21 U.S.C. section 360bbb-3(b)(1), unless the authorization is terminated or revoked sooner. Performed at [facility], [place] [address] [place], [place] [address]   GI pathogen panel by PCR, stool     Status: None   Collection Time: 10/21/18 12:39 PM   Specimen: Stool  Result Value Ref Range Status   Plesiomonas shigelloides NOT DETECTED NOT DETECTED Final   Yersinia enterocolitica NOT DETECTED NOT DETECTED Final   Vibrio NOT DETECTED NOT DETECTED Final   Enteropathogenic E coli NOT DETECTED NOT DETECTED Final   E coli (ETEC) LT/ST NOT DETECTED NOT DETECTED Final   E coli 3220 by PCR Not applicable NOT DETECTED Final   Cryptosporidium by PCR NOT DETECTED NOT DETECTED Final   Entamoeba histolytica NOT DETECTED NOT DETECTED Final   Adenovirus F 40/41 NOT DETECTED NOT DETECTED Final   Norovirus GI/GII NOT DETECTED NOT DETECTED Final   Sapovirus NOT DETECTED NOT DETECTED Final    Comment: (NOTE) Performed At: [facility] [place], [place] [id number] Rush Farmer MD JS:[id number]    Vibrio cholerae NOT DETECTED NOT DETECTED Final   Campylobacter by PCR NOT DETECTED NOT DETECTED Final   Salmonella by PCR NOT DETECTED NOT DETECTED Final   E coli (STEC) NOT DETECTED NOT DETECTED Final   Enteroaggregative E coli NOT DETECTED NOT DETECTED Final   Shigella by PCR NOT DETECTED NOT DETECTED Final   Cyclospora cayetanensis NOT DETECTED NOT DETECTED Final  Astrovirus NOT DETECTED NOT DETECTED Final   G lamblia by PCR NOT DETECTED NOT DETECTED Final   Rotavirus A by PCR NOT DETECTED NOT DETECTED Final  C Difficile Quick Screen w PCR reflex     Status: None    Collection Time: 10/21/18 12:39 PM   Specimen: Stool  Result Value Ref Range Status   C Diff antigen NEGATIVE NEGATIVE Final   C Diff toxin NEGATIVE NEGATIVE Final   C Diff interpretation No C. difficile detected.  Final    Comment: Performed at [facility], [place] [address], [place], [place] [address]     Time coordinating discharge: Over 30 minutes  SIGNED:   Jaymian Bogart J [place] DO  [facility] 10/24/2018, 9:52 AM

## 2018-10-25 ENCOUNTER — Ambulatory Visit
Admission: RE | Admit: 2018-10-25 | Discharge: 2018-10-25 | Disposition: A | Discharge status: Home / Self Care | Payer: Medicaid Other | Source: Ambulatory Visit | Attending: Radiation Oncology | Admitting: Radiation Oncology

## 2018-10-25 ENCOUNTER — Other Ambulatory Visit: Payer: Self-pay

## 2018-10-25 DIAGNOSIS — C250 Malignant neoplasm of head of pancreas: Secondary | ICD-10-CM | POA: Diagnosis not present

## 2018-10-25 DIAGNOSIS — Z510 Encounter for antineoplastic radiation therapy: Secondary | ICD-10-CM | POA: Diagnosis not present

## 2018-10-25 LAB — CULTURE, BLOOD (SINGLE)
Culture: NO GROWTH
Special Requests: ADEQUATE

## 2018-10-25 LAB — SURGICAL PATHOLOGY

## 2018-10-28 ENCOUNTER — Other Ambulatory Visit: Payer: Self-pay

## 2018-10-28 ENCOUNTER — Telehealth: Payer: Self-pay

## 2018-10-28 ENCOUNTER — Ambulatory Visit
Admission: RE | Admit: 2018-10-28 | Discharge: 2018-10-28 | Disposition: A | Discharge status: Home / Self Care | Payer: Medicaid Other | Source: Ambulatory Visit | Attending: Radiation Oncology | Admitting: Radiation Oncology

## 2018-10-28 DIAGNOSIS — Z510 Encounter for antineoplastic radiation therapy: Secondary | ICD-10-CM | POA: Diagnosis not present

## 2018-10-28 NOTE — Telephone Encounter (Signed)
The appt has been scheduled with Dr Hilarie Fredrickson on 11/18/18 at 4:15 pm

## 2018-10-28 NOTE — Telephone Encounter (Signed)
-----   Message from Loralie Champagne, PA-C sent at 10/24/2018  9:05 AM EDT ----- Patient needs hospital follow-up with Dr. Ramond Dial, or myself in about 4 weeks.  Thank you,  Jess

## 2018-10-29 ENCOUNTER — Ambulatory Visit
Admission: RE | Admit: 2018-10-29 | Discharge: 2018-10-29 | Disposition: A | Discharge status: Home / Self Care | Payer: Medicaid Other | Source: Ambulatory Visit | Attending: Radiation Oncology | Admitting: Radiation Oncology

## 2018-10-29 ENCOUNTER — Other Ambulatory Visit: Payer: Self-pay

## 2018-10-29 DIAGNOSIS — Z510 Encounter for antineoplastic radiation therapy: Secondary | ICD-10-CM | POA: Diagnosis not present

## 2018-10-30 ENCOUNTER — Inpatient Hospital Stay: Payer: Medicaid Other

## 2018-10-30 ENCOUNTER — Other Ambulatory Visit: Payer: Self-pay

## 2018-10-30 ENCOUNTER — Ambulatory Visit: Payer: Medicaid Other

## 2018-10-30 ENCOUNTER — Ambulatory Visit
Admission: RE | Admit: 2018-10-30 | Discharge: 2018-10-30 | Disposition: A | Discharge status: Home / Self Care | Payer: Medicaid Other | Source: Ambulatory Visit | Attending: Radiation Oncology | Admitting: Radiation Oncology

## 2018-10-30 ENCOUNTER — Inpatient Hospital Stay: Payer: Medicaid Other | Admitting: Hematology

## 2018-10-30 ENCOUNTER — Telehealth: Payer: Self-pay

## 2018-10-30 DIAGNOSIS — Z510 Encounter for antineoplastic radiation therapy: Secondary | ICD-10-CM | POA: Diagnosis not present

## 2018-10-30 NOTE — Telephone Encounter (Signed)
Received call from [facility] in Infusion patient had presented there stating she does not need the IVF today because she is eating and drinking well.  Also has RT first thing and does not want to stay for all her other scheduled appointments lab/PF and f/u with Dr. Burr Medico as she has made plans.  I cancelled all the appointments and we will keep the ones scheduled for next Wednesday 10/21 with Dr. Burr Medico seeing her in infusion.  Dr. Burr Medico was made aware.

## 2018-10-30 NOTE — Progress Notes (Signed)
Nutrition:  Patient identified on Malnutrition Screening report for weight loss and poor appetite.    48 year old patient with pancreatic cancer s/p whipple (06/15/2018). Noted hospital admissions with colitis, nausea, vomiting, abdominal pain.  Patient currently undergoing radiation treatment.    RD was planning on speaking with patient during infusion today but appointments cancelled for today per patient's request.    Called patient via phone but no answer.  Left message with call back number.    Yemaya Barnier B. Zenia Resides, [place], [place] Registered Dietitian [phone] (pager)

## 2018-10-31 ENCOUNTER — Other Ambulatory Visit: Payer: Self-pay

## 2018-10-31 ENCOUNTER — Ambulatory Visit
Admission: RE | Admit: 2018-10-31 | Discharge: 2018-10-31 | Disposition: A | Discharge status: Home / Self Care | Payer: Medicaid Other | Source: Ambulatory Visit | Attending: Radiation Oncology | Admitting: Radiation Oncology

## 2018-10-31 DIAGNOSIS — Z510 Encounter for antineoplastic radiation therapy: Secondary | ICD-10-CM | POA: Diagnosis not present

## 2018-11-01 ENCOUNTER — Ambulatory Visit
Admission: RE | Admit: 2018-11-01 | Discharge: 2018-11-01 | Disposition: A | Discharge status: Home / Self Care | Payer: Medicaid Other | Source: Ambulatory Visit | Attending: Radiation Oncology | Admitting: Radiation Oncology

## 2018-11-01 ENCOUNTER — Other Ambulatory Visit: Payer: Self-pay

## 2018-11-01 DIAGNOSIS — Z510 Encounter for antineoplastic radiation therapy: Secondary | ICD-10-CM | POA: Diagnosis not present

## 2018-11-04 ENCOUNTER — Ambulatory Visit
Admission: RE | Admit: 2018-11-04 | Discharge: 2018-11-04 | Disposition: A | Discharge status: Home / Self Care | Payer: Medicaid Other | Source: Ambulatory Visit | Attending: Radiation Oncology | Admitting: Radiation Oncology

## 2018-11-04 ENCOUNTER — Other Ambulatory Visit: Payer: Self-pay

## 2018-11-04 DIAGNOSIS — Z510 Encounter for antineoplastic radiation therapy: Secondary | ICD-10-CM | POA: Diagnosis not present

## 2018-11-05 ENCOUNTER — Ambulatory Visit: Payer: Medicaid Other

## 2018-11-06 ENCOUNTER — Ambulatory Visit
Admission: RE | Admit: 2018-11-06 | Discharge: 2018-11-06 | Disposition: A | Discharge status: Home / Self Care | Payer: Medicaid Other | Source: Ambulatory Visit | Attending: Radiation Oncology | Admitting: Radiation Oncology

## 2018-11-06 ENCOUNTER — Observation Stay (HOSPITAL_COMMUNITY)
Admission: EM | Admit: 2018-11-06 | Discharge: 2018-11-08 | Disposition: A | Discharge status: Home / Self Care | Payer: Medicaid Other | Attending: Internal Medicine | Admitting: Internal Medicine

## 2018-11-06 ENCOUNTER — Inpatient Hospital Stay: Payer: Medicaid Other

## 2018-11-06 ENCOUNTER — Emergency Department (HOSPITAL_COMMUNITY): Payer: Medicaid Other

## 2018-11-06 ENCOUNTER — Other Ambulatory Visit: Payer: Self-pay

## 2018-11-06 ENCOUNTER — Encounter (HOSPITAL_COMMUNITY): Payer: Self-pay

## 2018-11-06 DIAGNOSIS — I700 Atherosclerosis of aorta: Secondary | ICD-10-CM | POA: Insufficient documentation

## 2018-11-06 DIAGNOSIS — Z9221 Personal history of antineoplastic chemotherapy: Secondary | ICD-10-CM | POA: Insufficient documentation

## 2018-11-06 DIAGNOSIS — D509 Iron deficiency anemia, unspecified: Secondary | ICD-10-CM | POA: Diagnosis not present

## 2018-11-06 DIAGNOSIS — Z79891 Long term (current) use of opiate analgesic: Secondary | ICD-10-CM

## 2018-11-06 DIAGNOSIS — Z8249 Family history of ischemic heart disease and other diseases of the circulatory system: Secondary | ICD-10-CM | POA: Diagnosis not present

## 2018-11-06 DIAGNOSIS — G8929 Other chronic pain: Secondary | ICD-10-CM | POA: Insufficient documentation

## 2018-11-06 DIAGNOSIS — Z923 Personal history of irradiation: Secondary | ICD-10-CM | POA: Diagnosis not present

## 2018-11-06 DIAGNOSIS — Z90411 Acquired partial absence of pancreas: Secondary | ICD-10-CM | POA: Insufficient documentation

## 2018-11-06 DIAGNOSIS — F17210 Nicotine dependence, cigarettes, uncomplicated: Secondary | ICD-10-CM | POA: Diagnosis not present

## 2018-11-06 DIAGNOSIS — Z510 Encounter for antineoplastic radiation therapy: Secondary | ICD-10-CM | POA: Diagnosis not present

## 2018-11-06 DIAGNOSIS — Z9049 Acquired absence of other specified parts of digestive tract: Secondary | ICD-10-CM | POA: Insufficient documentation

## 2018-11-06 DIAGNOSIS — Z801 Family history of malignant neoplasm of trachea, bronchus and lung: Secondary | ICD-10-CM | POA: Insufficient documentation

## 2018-11-06 DIAGNOSIS — C250 Malignant neoplasm of head of pancreas: Secondary | ICD-10-CM | POA: Diagnosis not present

## 2018-11-06 DIAGNOSIS — Z79899 Other long term (current) drug therapy: Secondary | ICD-10-CM | POA: Diagnosis not present

## 2018-11-06 DIAGNOSIS — Z20828 Contact with and (suspected) exposure to other viral communicable diseases: Secondary | ICD-10-CM | POA: Insufficient documentation

## 2018-11-06 DIAGNOSIS — K760 Fatty (change of) liver, not elsewhere classified: Secondary | ICD-10-CM | POA: Diagnosis not present

## 2018-11-06 DIAGNOSIS — K529 Noninfective gastroenteritis and colitis, unspecified: Principal | ICD-10-CM

## 2018-11-06 DIAGNOSIS — I10 Essential (primary) hypertension: Secondary | ICD-10-CM | POA: Insufficient documentation

## 2018-11-06 DIAGNOSIS — C259 Malignant neoplasm of pancreas, unspecified: Secondary | ICD-10-CM | POA: Diagnosis present

## 2018-11-06 DIAGNOSIS — K5900 Constipation, unspecified: Secondary | ICD-10-CM

## 2018-11-06 DIAGNOSIS — R112 Nausea with vomiting, unspecified: Secondary | ICD-10-CM | POA: Diagnosis present

## 2018-11-06 DIAGNOSIS — R109 Unspecified abdominal pain: Secondary | ICD-10-CM | POA: Diagnosis present

## 2018-11-06 LAB — COMPREHENSIVE METABOLIC PANEL
ALT: 25 U/L (ref 0–44)
AST: 27 U/L (ref 15–41)
Albumin: 3.2 g/dL — ABNORMAL LOW (ref 3.5–5.0)
Alkaline Phosphatase: 98 U/L (ref 38–126)
Anion gap: 8 (ref 5–15)
BUN: 9 mg/dL (ref 6–20)
CO2: 25 mmol/L (ref 22–32)
Calcium: 8.8 mg/dL — ABNORMAL LOW (ref 8.9–10.3)
Chloride: 103 mmol/L (ref 98–111)
Creatinine, Ser: 0.51 mg/dL (ref 0.44–1.00)
GFR calc Af Amer: >60 mL/min (ref >60)
GFR calc non Af Amer: >60 mL/min (ref >60)
Glucose, Bld: 105 mg/dL — ABNORMAL HIGH (ref 70–99)
Potassium: 3.8 mmol/L (ref 3.5–5.1)
Sodium: 136 mmol/L (ref 135–145)
Total Bilirubin: 0.7 mg/dL (ref 0.3–1.2)
Total Protein: 6.5 g/dL (ref 6.5–8.1)

## 2018-11-06 LAB — I-STAT BETA HCG BLOOD, ED (MC, WL, AP ONLY): I-stat hCG, quantitative: <5 m[IU]/mL (ref <5)

## 2018-11-06 LAB — CBC WITH DIFFERENTIAL/PLATELET
Abs Immature Granulocytes: 0.03 10*3/uL (ref 0.00–0.07)
Basophils Absolute: 0 10*3/uL (ref 0.0–0.1)
Basophils Relative: 0 %
Eosinophils Absolute: 0.1 10*3/uL (ref 0.0–0.5)
Eosinophils Relative: 3 %
HCT: 32.2 % — ABNORMAL LOW (ref 36.0–46.0)
Hemoglobin: 10.4 g/dL — ABNORMAL LOW (ref 12.0–15.0)
Immature Granulocytes: 1 %
Lymphocytes Relative: 6 %
Lymphs Abs: 0.3 10*3/uL — ABNORMAL LOW (ref 0.7–4.0)
MCH: 20.6 pg — ABNORMAL LOW (ref 26.0–34.0)
MCHC: 32.3 g/dL (ref 30.0–36.0)
MCV: 63.8 fL — ABNORMAL LOW (ref 80.0–100.0)
Monocytes Absolute: 0.3 10*3/uL (ref 0.1–1.0)
Monocytes Relative: 8 %
Neutro Abs: 3.6 10*3/uL (ref 1.7–7.7)
Neutrophils Relative %: 82 %
Platelets: 190 10*3/uL (ref 150–400)
RBC: 5.05 MIL/uL (ref 3.87–5.11)
RDW: 19 % — ABNORMAL HIGH (ref 11.5–15.5)
WBC: 4.3 10*3/uL (ref 4.0–10.5)
nRBC: 0 % (ref 0.0–0.2)

## 2018-11-06 LAB — LIPASE, BLOOD: Lipase: 17 U/L (ref 11–51)

## 2018-11-06 MED ORDER — ONDANSETRON HCL 4 MG/2ML IJ SOLN
4.0000 mg | Freq: Once | INTRAMUSCULAR | Status: AC
  Administered 2018-11-07: 4 mg via INTRAVENOUS
  Filled 2018-11-06: qty 2

## 2018-11-06 MED ORDER — HYDROMORPHONE HCL 1 MG/ML IJ SOLN
1.0000 mg | Freq: Once | INTRAMUSCULAR | Status: AC
  Administered 2018-11-06: 1 mg via INTRAVENOUS
  Filled 2018-11-06: qty 1

## 2018-11-06 MED ORDER — ONDANSETRON HCL 4 MG/2ML IJ SOLN
4.0000 mg | Freq: Once | INTRAMUSCULAR | Status: AC
  Administered 2018-11-06: 4 mg via INTRAVENOUS
  Filled 2018-11-06: qty 2

## 2018-11-06 NOTE — ED Provider Notes (Signed)
[facility] DEPT Provider Note   CSN: [id number] Arrival date & time: 11/06/18  2146     History   Chief Complaint No chief complaint on file.   HPI Moriyah Byington is a 48 y.o. female.     The history is provided by the patient.  Abdominal Pain Pain location:  Generalized Pain quality: sharp   Pain radiates to:  Does not radiate Pain severity:  Severe Onset quality:  Gradual Duration:  2 days Timing:  Constant Progression:  Worsening Chronicity:  Recurrent Context: recent illness   Context: not alcohol use   Relieved by:  Nothing Worsened by:  Nothing Associated symptoms: nausea and vomiting   Associated symptoms: no chest pain, no constipation, no cough, no dysuria, no fever and no shortness of breath   Risk factors: recent hospitalization   Risk factors: not pregnant   Patient with pancreatic cancer currently undergoing radiation presents with 2 days of abdominal pain and vomiting following radiation.  Patient unable to keep down her home pain medication and states "when I come here they give me dilaudid."  Past Medical History:  Diagnosis Date  . Family history of lung cancer   . Family history of prostate cancer   . Family history of uterine cancer   . Gallstones 10/2017  . Pancreatic cancer ([place])   . Pneumothorax, closed, traumatic    years ago  . PONV (postoperative nausea and vomiting)     Patient Active Problem List   Diagnosis Date Noted  . Pancolitis ([place]) 10/20/2018  . Microcytic anemia 10/20/2018  . Hospital discharge follow-up 06/29/2018  . Nausea & vomiting 06/29/2018  . Colitis 06/15/2018  . Peripheral edema   . Prediabetes   . Acute blood loss anemia   . Thrombocytopenia ([place])   . Urinary retention   . Sinus tachycardia   . Debility 05/16/2018  . Acute respiratory failure with hypoxemia ([place]) 04/30/2018  . HCAP (healthcare-associated pneumonia) 04/30/2018  . Essential hypertension 04/30/2018  . Chronic  prescription opiate use 04/30/2018  . Postoperative pain 04/30/2018  . Tachycardia 04/30/2018  . Tachypnea 04/30/2018  . Acute respiratory alkalosis 04/30/2018  . Pancreatic adenocarcinoma ([place]) 04/25/2018  . Genetic testing 01/24/2018  . Family history of uterine cancer   . Family history of prostate cancer   . Family history of lung cancer   . Malignant neoplasm of head of pancreas ([place]) 11/09/2017  . Pancreatic mass   . Cholecystitis 10/30/2017  . Jaundice 10/30/2017    Past Surgical History:  Procedure Laterality Date  . BILIARY STENT PLACEMENT  10/31/2017   Procedure: BILIARY STENT PLACEMENT;  Surgeon: [name], MD;  Location: [facility] ENDOSCOPY;  Service: Gastroenterology;;  . BIOPSY  10/23/2018   Procedure: BIOPSY;  Surgeon: [name], MD;  Location: [facility] ENDOSCOPY;  Service: Endoscopy;;  . COLONOSCOPY WITH PROPOFOL N/A 10/23/2018   Procedure: COLONOSCOPY WITH PROPOFOL;  Surgeon: [name], MD;  Location: [facility] ENDOSCOPY;  Service: Endoscopy;  Laterality: N/A;  . ERCP N/A 10/31/2017   Procedure: ENDOSCOPIC RETROGRADE CHOLANGIOPANCREATOGRAPHY (ERCP);  Surgeon: [name], MD;  Location: [facility] ENDOSCOPY;  Service: Gastroenterology;  Laterality: N/A;  . ESOPHAGOGASTRODUODENOSCOPY N/A 11/08/2017   Procedure: ESOPHAGOGASTRODUODENOSCOPY (EGD);  Surgeon: Milus Banister, MD;  Location: [facility] ENDOSCOPY;  Service: Endoscopy;  Laterality: N/A;  . EUS N/A 11/08/2017   Procedure: UPPER ENDOSCOPIC ULTRASOUND (EUS) RADIAL;  Surgeon: Milus Banister, MD;  Location: [facility] ENDOSCOPY;  Service: Endoscopy;  Laterality: N/A;  . FINE NEEDLE ASPIRATION  11/08/2017  Procedure: FINE NEEDLE ASPIRATION;  Surgeon: Milus Banister, MD;  Location: [facility] ENDOSCOPY;  Service: Endoscopy;;  . IR IMAGING GUIDED PORT INSERTION  11/15/2017  . LAPAROSCOPY N/A 04/25/2018   Procedure: LAPAROSCOPY DIAGNOSTIC;  Surgeon: Stark Klein, MD;  Location: [facility];  Service: General;  Laterality: N/A;  GENERAL AND EPIDURAL  ANESTHESIA  . SPHINCTEROTOMY  10/31/2017   Procedure: SPHINCTEROTOMY;  Surgeon: [name], MD;  Location: [facility] ENDOSCOPY;  Service: Gastroenterology;;  . TONSILLECTOMY    . WHIPPLE PROCEDURE N/A 04/25/2018   Procedure: WHIPPLE PROCEDURE, RECONSTRUCTION OF PORTAL VEIN;  Surgeon: Stark Klein, MD;  Location: [facility];  Service: General;  Laterality: N/A;     OB History   No obstetric history on file.      Home Medications    Prior to Admission medications   Medication Sig Start Date End Date Taking? Authorizing Provider  cyclobenzaprine (FEXMID) 7.5 MG tablet Take 1 tablet (7.5 mg total) by mouth 3 (three) times daily as needed for muscle spasms. 10/11/18  Yes Truitt Merle, MD  dronabinol (MARINOL) 2.5 MG capsule Take 1 capsule (2.5 mg total) by mouth 2 (two) times daily before lunch and supper. 09/24/18  Yes Truitt Merle, MD  gabapentin (NEURONTIN) 300 MG capsule Take 1 capsule (300 mg total) by mouth 3 (three) times daily. 09/02/18  Yes Truitt Merle, MD  lipase/protease/amylase (CREON) 36000 UNITS CPEP capsule Take 36,000 Units by mouth 3 (three) times daily before meals.   Yes [provider]  morphine (MS CONTIN) 15 MG 12 hr tablet Take 1 tablet (15 mg total) by mouth daily. Plan to wean off in the next month 09/24/18  Yes Truitt Merle, MD  morphine (MSIR) 15 MG tablet Take 1 tablet (15 mg total) by mouth every 6 (six) hours as needed for severe pain. 10/24/18  Yes Truitt Merle, MD  ondansetron (ZOFRAN ODT) 8 MG disintegrating tablet Take 1 tablet (8 mg total) by mouth every 8 (eight) hours as needed for nausea or vomiting. 10/11/18  Yes Truitt Merle, MD  potassium chloride SA (KLOR-CON) 20 MEQ tablet Take 20 mEq by mouth daily.   Yes [provider]  diphenoxylate-atropine (LOMOTIL) 2.5-0.025 MG tablet Take 1 to 2 tablets PO QID prn diarrhea Patient not taking: Reported on 10/22/2018 08/22/18   Harle Stanford., PA-C  metoCLOPramide (REGLAN) 5 MG tablet Take 1 tablet (5 mg total) by mouth every 6  (six) hours as needed for nausea or vomiting. Patient not taking: Reported on 11/06/2018 07/23/18   Alla Feeling, NP  pantoprazole (PROTONIX) 40 MG tablet Take 1 tablet (40 mg total) by mouth daily. Patient not taking: Reported on 11/06/2018 08/20/18   Truitt Merle, MD  potassium chloride SA (KLOR-CON) 20 MEQ tablet Take 1 tablet (20 mEq total) by mouth 2 (two) times daily for 5 days. 10/21/18 10/26/18  Antonieta Pert, MD  promethazine (PHENERGAN) 25 MG tablet Take 1 tablet (25 mg total) by mouth every 6 (six) hours as needed for nausea or vomiting. Patient not taking: Reported on 11/06/2018 06/19/18   Stark Klein, MD    Family History Family History  Problem Relation Age of Onset  . Diabetes Mother   . Hypertension Mother   . COPD Mother   . Uterine cancer Mother 10       had hysterectomy  . Diabetes Sister   . Hypertension Sister   . Lung cancer Maternal Grandmother        lung cancer  . Hypertension Sister  Social History Social History   Tobacco Use  . Smoking status: Current Every Day Smoker    Packs/day: 1.00    Years: 37.00    Pack years: 37.00    Types: Cigarettes  . Smokeless tobacco: Never Used  . Tobacco comment: Started back smoking  Substance Use Topics  . Alcohol use: Not Currently  . Drug use: Yes    Types: Marijuana     Allergies   Latex   Review of Systems Review of Systems  Constitutional: Negative for fever.  HENT: Negative for congestion.   Eyes: Negative for visual disturbance.  Respiratory: Negative for cough and shortness of breath.   Cardiovascular: Negative for chest pain.  Gastrointestinal: Positive for abdominal pain, nausea and vomiting. Negative for constipation.  Genitourinary: Negative for dysuria.  Musculoskeletal: Negative for arthralgias.  Neurological: Negative for dizziness.  Psychiatric/Behavioral: Negative for agitation.  All other systems reviewed and are negative.    Physical Exam Updated Vital Signs BP 102/79 (BP  Location: Left Arm)   Pulse 95   Temp 98.7 F (37.1 C) (Oral)   Resp 16   Ht 4' 10"  (1.473 m)   Wt 43.1 kg   SpO2 100%   BMI 19.86 kg/m   Physical Exam Vitals signs and nursing note reviewed.  Constitutional:      General: She is not in acute distress.    Appearance: She is normal weight.  HENT:     Head: Normocephalic and atraumatic.     Nose: Nose normal.  Eyes:     Conjunctiva/sclera: Conjunctivae normal.     Pupils: Pupils are equal, round, and reactive to light.  Neck:     Musculoskeletal: Normal range of motion and neck supple.  Cardiovascular:     Rate and Rhythm: Normal rate and regular rhythm.     Pulses: Normal pulses.     Heart sounds: Normal heart sounds.  Pulmonary:     Effort: Pulmonary effort is normal.     Breath sounds: Normal breath sounds.  Abdominal:     General: Abdomen is flat. Bowel sounds are normal.     Tenderness: There is no abdominal tenderness. There is no guarding or rebound.  Musculoskeletal: Normal range of motion.  Skin:    General: Skin is warm and dry.     Capillary Refill: Capillary refill takes less than 2 seconds.  Neurological:     General: No focal deficit present.     Mental Status: She is alert and oriented to person, place, and time.     Deep Tendon Reflexes: Reflexes normal.  Psychiatric:        Mood and Affect: Mood normal.        Behavior: Behavior normal.      ED Treatments / Results  Labs (all labs ordered are listed, but only abnormal results are displayed) Results for orders placed or performed during the hospital encounter of 11/06/18  Comprehensive metabolic panel  Result Value Ref Range   Sodium 136 135 - 145 mmol/L   Potassium 3.8 3.5 - 5.1 mmol/L   Chloride 103 98 - 111 mmol/L   CO2 25 22 - 32 mmol/L   Glucose, Bld 105 (H) 70 - 99 mg/dL   BUN 9 6 - 20 mg/dL   Creatinine, Ser 0.51 0.44 - 1.00 mg/dL   Calcium 8.8 (L) 8.9 - 10.3 mg/dL   Total Protein 6.5 6.5 - 8.1 g/dL   Albumin 3.2 (L) 3.5 - 5.0 g/dL    AST 27  15 - 41 U/L   ALT 25 0 - 44 U/L   Alkaline Phosphatase 98 38 - 126 U/L   Total Bilirubin 0.7 0.3 - 1.2 mg/dL   GFR calc non Af Amer >60 >60 mL/min   GFR calc Af Amer >60 >60 mL/min   Anion gap 8 5 - 15  Lipase, blood  Result Value Ref Range   Lipase 17 11 - 51 U/L  CBC with Diff  Result Value Ref Range   WBC 4.3 4.0 - 10.5 K/uL   RBC 5.05 3.87 - 5.11 MIL/uL   Hemoglobin 10.4 (L) 12.0 - 15.0 g/dL   HCT 32.2 (L) 36.0 - 46.0 %   MCV 63.8 (L) 80.0 - 100.0 fL   MCH 20.6 (L) 26.0 - 34.0 pg   MCHC 32.3 30.0 - 36.0 g/dL   RDW 19.0 (H) 11.5 - 15.5 %   Platelets 190 150 - 400 K/uL   nRBC 0.0 0.0 - 0.2 %   Neutrophils Relative % 82 %   Neutro Abs 3.6 1.7 - 7.7 K/uL   Lymphocytes Relative 6 %   Lymphs Abs 0.3 (L) 0.7 - 4.0 K/uL   Monocytes Relative 8 %   Monocytes Absolute 0.3 0.1 - 1.0 K/uL   Eosinophils Relative 3 %   Eosinophils Absolute 0.1 0.0 - 0.5 K/uL   Basophils Relative 0 %   Basophils Absolute 0.0 0.0 - 0.1 K/uL   Immature Granulocytes 1 %   Abs Immature Granulocytes 0.03 0.00 - 0.07 K/uL  Urinalysis, Routine w reflex microscopic  Result Value Ref Range   Color, Urine AMBER (A) YELLOW   APPearance HAZY (A) CLEAR   Specific Gravity, Urine 1.044 (H) 1.005 - 1.030   pH 5.0 5.0 - 8.0   Glucose, UA NEGATIVE NEGATIVE mg/dL   Hgb urine dipstick NEGATIVE NEGATIVE   Bilirubin Urine NEGATIVE NEGATIVE   Ketones, ur 5 (A) NEGATIVE mg/dL   Protein, ur 30 (A) NEGATIVE mg/dL   Nitrite NEGATIVE NEGATIVE   Leukocytes,Ua NEGATIVE NEGATIVE  I-Stat beta hCG blood, ED  Result Value Ref Range   I-stat hCG, quantitative <5.0 <5 mIU/mL   Comment 3           Ct Abdomen Pelvis W Contrast  Result Date: 11/07/2018 CLINICAL DATA:  Abdominal pain for 2 days with nausea vomiting EXAM: CT ABDOMEN AND PELVIS WITH CONTRAST TECHNIQUE: Multidetector CT imaging of the abdomen and pelvis was performed using the standard protocol following bolus administration of intravenous contrast.  CONTRAST:  10m OMNIPAQUE IOHEXOL 300 MG/ML  SOLN COMPARISON:  October 20, 2018 FINDINGS: Lower chest: The visualized heart size within normal limits. No pericardial fluid/thickening. No hiatal hernia. The visualized portions of the lungs are clear. Hepatobiliary: Again noted is diffuse low density seen throughout the liver parenchyma with heterogeneous enhancement in the posterior right liver lobe.The main portal vein is patent. The patient is status post cholecystectomy. No biliary ductal dilation. Pancreas: There is atrophy within the mid body and tail of the pancreas with a prior whipple procedure. Spleen: Normal in size without focal abnormality. Adrenals/Urinary Tract: Both adrenal glands appear normal. The kidneys and collecting system appear normal without evidence of urinary tract calculus or hydronephrosis. Bladder is unremarkable. Stomach/Bowel: The stomach is grossly unremarkable. There remains diffuse bowel wall thickening and edema within the proximal transverse colon and hepatic flexure. There is also question of mild wall thickening seen in the sigmoid colon. This appears to be improved since the prior exam. There is a  moderate to large amount of colonic stool present.The appendix is normal. Vascular/Lymphatic: There are no enlarged mesenteric, retroperitoneal, or pelvic lymph nodes. Scattered aortic atherosclerotic calcifications are seen without aneurysmal dilatation. Reproductive: The uterus and adnexa are unremarkable. Other: No evidence of abdominal wall mass or hernia. Musculoskeletal: No acute or significant osseous findings. IMPRESSION: 1. There remains edematous appearance to the proximal transverse colon and hepatic flexure as well as possibly the sigmoid colon. Findings may be infectious or inflammatory. This however overall appears to be improved since the prior exam of October 20, 2018. 2. Moderate to large amount of colonic stool. 3. Hepatic steatosis with heterogeneous enhancement.  Electronically Signed   By: Prudencio Pair M.D.   On: 11/07/2018 01:16    Radiology No results found.  Procedures Procedures (including critical care time)  Medications Ordered in ED Medications  ketorolac (TORADOL) 30 MG/ML injection 15 mg (has no administration in time range)  piperacillin-tazobactam (ZOSYN) IVPB 3.375 g (has no administration in time range)  ondansetron (ZOFRAN) injection 4 mg (4 mg Intravenous Given 11/06/18 2326)  HYDROmorphone (DILAUDID) injection 1 mg (1 mg Intravenous Given 11/06/18 2346)  ondansetron (ZOFRAN) injection 4 mg (4 mg Intravenous Given 11/07/18 0057)  iohexol (OMNIPAQUE) 300 MG/ML solution 100 mL (80 mLs Intravenous Contrast Given 11/07/18 0015)  sodium chloride (PF) 0.9 % injection (  Given by Other 11/07/18 0032)  HYDROmorphone (DILAUDID) injection 1 mg (1 mg Intravenous Given 11/07/18 0056)     Final Clinical Impressions(s) / ED Diagnoses   Colitis with ongoing pain and emesis.  Will admit to medicine.  Will use zosyn as patient has already had cipro flagyl   Valary Manahan, MD 11/07/18 1638

## 2018-11-06 NOTE — ED Triage Notes (Signed)
Pt complains of abd pain for 2 days, she woke up this afternoon with severe pain and vomiting Pt was dx with pancreatic cancer last October and she does radiation everyday

## 2018-11-07 ENCOUNTER — Emergency Department (HOSPITAL_COMMUNITY): Payer: Medicaid Other

## 2018-11-07 ENCOUNTER — Other Ambulatory Visit: Payer: Self-pay | Admitting: Oncology

## 2018-11-07 ENCOUNTER — Ambulatory Visit
Admission: RE | Admit: 2018-11-07 | Discharge: 2018-11-07 | Disposition: A | Discharge status: Home / Self Care | Payer: Medicaid Other | Source: Ambulatory Visit | Attending: Radiation Oncology | Admitting: Radiation Oncology

## 2018-11-07 ENCOUNTER — Encounter (HOSPITAL_COMMUNITY): Payer: Self-pay

## 2018-11-07 DIAGNOSIS — D509 Iron deficiency anemia, unspecified: Secondary | ICD-10-CM | POA: Diagnosis not present

## 2018-11-07 DIAGNOSIS — R109 Unspecified abdominal pain: Secondary | ICD-10-CM | POA: Diagnosis not present

## 2018-11-07 DIAGNOSIS — R112 Nausea with vomiting, unspecified: Secondary | ICD-10-CM | POA: Diagnosis not present

## 2018-11-07 DIAGNOSIS — Z510 Encounter for antineoplastic radiation therapy: Secondary | ICD-10-CM | POA: Diagnosis not present

## 2018-11-07 DIAGNOSIS — Z79891 Long term (current) use of opiate analgesic: Secondary | ICD-10-CM | POA: Diagnosis not present

## 2018-11-07 DIAGNOSIS — C259 Malignant neoplasm of pancreas, unspecified: Secondary | ICD-10-CM

## 2018-11-07 LAB — COMPREHENSIVE METABOLIC PANEL
ALT: 27 U/L (ref 0–44)
AST: 42 U/L — ABNORMAL HIGH (ref 15–41)
Albumin: 2.9 g/dL — ABNORMAL LOW (ref 3.5–5.0)
Alkaline Phosphatase: 90 U/L (ref 38–126)
Anion gap: 7 (ref 5–15)
BUN: 10 mg/dL (ref 6–20)
CO2: 27 mmol/L (ref 22–32)
Calcium: 8.3 mg/dL — ABNORMAL LOW (ref 8.9–10.3)
Chloride: 102 mmol/L (ref 98–111)
Creatinine, Ser: 0.51 mg/dL (ref 0.44–1.00)
GFR calc Af Amer: >60 mL/min (ref >60)
GFR calc non Af Amer: >60 mL/min (ref >60)
Glucose, Bld: 74 mg/dL (ref 70–99)
Potassium: 3.6 mmol/L (ref 3.5–5.1)
Sodium: 136 mmol/L (ref 135–145)
Total Bilirubin: 0.5 mg/dL (ref 0.3–1.2)
Total Protein: 5.4 g/dL — ABNORMAL LOW (ref 6.5–8.1)

## 2018-11-07 LAB — URINALYSIS, ROUTINE W REFLEX MICROSCOPIC
Bilirubin Urine: NEGATIVE
Glucose, UA: NEGATIVE mg/dL
Hgb urine dipstick: NEGATIVE
Ketones, ur: 5 mg/dL — AB
Leukocytes,Ua: NEGATIVE
Nitrite: NEGATIVE
Protein, ur: 30 mg/dL — AB
Specific Gravity, Urine: 1.044 — ABNORMAL HIGH (ref 1.005–1.030)
pH: 5 (ref 5.0–8.0)

## 2018-11-07 LAB — CBC WITH DIFFERENTIAL/PLATELET
Abs Immature Granulocytes: 0.01 10*3/uL (ref 0.00–0.07)
Basophils Absolute: 0 10*3/uL (ref 0.0–0.1)
Basophils Relative: 1 %
Eosinophils Absolute: 0.1 10*3/uL (ref 0.0–0.5)
Eosinophils Relative: 4 %
HCT: 28 % — ABNORMAL LOW (ref 36.0–46.0)
Hemoglobin: 9 g/dL — ABNORMAL LOW (ref 12.0–15.0)
Immature Granulocytes: 0 %
Lymphocytes Relative: 8 %
Lymphs Abs: 0.3 10*3/uL — ABNORMAL LOW (ref 0.7–4.0)
MCH: 20.6 pg — ABNORMAL LOW (ref 26.0–34.0)
MCHC: 32.1 g/dL (ref 30.0–36.0)
MCV: 64.2 fL — ABNORMAL LOW (ref 80.0–100.0)
Monocytes Absolute: 0.3 10*3/uL (ref 0.1–1.0)
Monocytes Relative: 9 %
Neutro Abs: 2.5 10*3/uL (ref 1.7–7.7)
Neutrophils Relative %: 78 %
Platelets: 152 10*3/uL (ref 150–400)
RBC: 4.36 MIL/uL (ref 3.87–5.11)
RDW: 18.7 % — ABNORMAL HIGH (ref 11.5–15.5)
WBC: 3.2 10*3/uL — ABNORMAL LOW (ref 4.0–10.5)
nRBC: 0 % (ref 0.0–0.2)

## 2018-11-07 LAB — SARS CORONAVIRUS 2 BY RT PCR (HOSPITAL ORDER, PERFORMED IN ~~LOC~~ HOSPITAL LAB): SARS Coronavirus 2: NEGATIVE

## 2018-11-07 LAB — HIV ANTIBODY (ROUTINE TESTING W REFLEX): HIV Screen 4th Generation wRfx: NONREACTIVE

## 2018-11-07 MED ORDER — CYCLOBENZAPRINE HCL 5 MG PO TABS
7.5000 mg | ORAL_TABLET | Freq: Three times a day (TID) | ORAL | Status: DC | PRN
  Administered 2018-11-07: 7.5 mg via ORAL
  Filled 2018-11-07: qty 2

## 2018-11-07 MED ORDER — PROMETHAZINE HCL 25 MG PO TABS
12.5000 mg | ORAL_TABLET | Freq: Four times a day (QID) | ORAL | Status: DC | PRN
  Administered 2018-11-07 – 2018-11-08 (×2): 12.5 mg via ORAL
  Filled 2018-11-07 (×3): qty 1

## 2018-11-07 MED ORDER — ACETAMINOPHEN 325 MG PO TABS: 650.0000 mg | ORAL_TABLET | Freq: Four times a day (QID) | ORAL | Status: DC | PRN

## 2018-11-07 MED ORDER — ONDANSETRON HCL 4 MG/2ML IJ SOLN
4.0000 mg | Freq: Four times a day (QID) | INTRAMUSCULAR | Status: DC | PRN
  Administered 2018-11-07: 4 mg via INTRAVENOUS
  Filled 2018-11-07 (×2): qty 2

## 2018-11-07 MED ORDER — SODIUM CHLORIDE 0.9 % IV SOLN
INTRAVENOUS | Status: AC
  Administered 2018-11-07: 03:00:00 via INTRAVENOUS

## 2018-11-07 MED ORDER — DRONABINOL 2.5 MG PO CAPS
2.5000 mg | ORAL_CAPSULE | Freq: Two times a day (BID) | ORAL | Status: DC
  Administered 2018-11-07 – 2018-11-08 (×3): 2.5 mg via ORAL
  Filled 2018-11-07 (×3): qty 1

## 2018-11-07 MED ORDER — HYDROMORPHONE HCL 1 MG/ML IJ SOLN
1.0000 mg | INTRAMUSCULAR | Status: DC | PRN
  Administered 2018-11-07: 1 mg via INTRAVENOUS
  Administered 2018-11-07: 1.5 mg via INTRAVENOUS
  Filled 2018-11-07: qty 1.5
  Filled 2018-11-07: qty 1

## 2018-11-07 MED ORDER — IOHEXOL 300 MG/ML  SOLN
100.0000 mL | Freq: Once | INTRAMUSCULAR | Status: AC | PRN
  Administered 2018-11-07: 80 mL via INTRAVENOUS

## 2018-11-07 MED ORDER — BISACODYL 5 MG PO TBEC: 5.0000 mg | DELAYED_RELEASE_TABLET | Freq: Every day | ORAL | Status: DC | PRN

## 2018-11-07 MED ORDER — SODIUM CHLORIDE 0.9% FLUSH: 10.0000 mL | Freq: Two times a day (BID) | INTRAVENOUS | Status: DC

## 2018-11-07 MED ORDER — SODIUM CHLORIDE (PF) 0.9 % IJ SOLN
INTRAMUSCULAR | Status: AC
  Administered 2018-11-07: 01:00:00
  Filled 2018-11-07: qty 50

## 2018-11-07 MED ORDER — ENOXAPARIN SODIUM 30 MG/0.3ML ~~LOC~~ SOLN
30.0000 mg | SUBCUTANEOUS | Status: DC
  Administered 2018-11-07: 30 mg via SUBCUTANEOUS
  Filled 2018-11-07 (×2): qty 0.3

## 2018-11-07 MED ORDER — SODIUM CHLORIDE 0.9% FLUSH
10.0000 mL | INTRAVENOUS | Status: DC | PRN
  Administered 2018-11-08: 10 mL
  Filled 2018-11-07: qty 40

## 2018-11-07 MED ORDER — MORPHINE SULFATE 15 MG PO TABS
15.0000 mg | ORAL_TABLET | ORAL | Status: DC | PRN
  Administered 2018-11-07 – 2018-11-08 (×6): 15 mg via ORAL
  Filled 2018-11-07 (×6): qty 1

## 2018-11-07 MED ORDER — PIPERACILLIN-TAZOBACTAM 3.375 G IVPB 30 MIN
3.3750 g | Freq: Once | INTRAVENOUS | Status: AC
  Administered 2018-11-07: 3.375 g via INTRAVENOUS
  Filled 2018-11-07: qty 50

## 2018-11-07 MED ORDER — PANCRELIPASE (LIP-PROT-AMYL) 12000-38000 UNITS PO CPEP
36000.0000 [IU] | ORAL_CAPSULE | Freq: Three times a day (TID) | ORAL | Status: DC
  Administered 2018-11-07 – 2018-11-08 (×4): 36000 [IU] via ORAL
  Filled 2018-11-07 (×5): qty 3

## 2018-11-07 MED ORDER — ACETAMINOPHEN 650 MG RE SUPP: 650.0000 mg | Freq: Four times a day (QID) | RECTAL | Status: DC | PRN

## 2018-11-07 MED ORDER — GABAPENTIN 300 MG PO CAPS
300.0000 mg | ORAL_CAPSULE | Freq: Three times a day (TID) | ORAL | Status: DC
  Administered 2018-11-07 – 2018-11-08 (×4): 300 mg via ORAL
  Filled 2018-11-07 (×5): qty 1

## 2018-11-07 MED ORDER — HYDROMORPHONE HCL 1 MG/ML IJ SOLN
1.0000 mg | Freq: Once | INTRAMUSCULAR | Status: AC
  Administered 2018-11-07: 1 mg via INTRAVENOUS
  Filled 2018-11-07: qty 1

## 2018-11-07 MED ORDER — SENNOSIDES-DOCUSATE SODIUM 8.6-50 MG PO TABS: 1.0000 | ORAL_TABLET | Freq: Every evening | ORAL | Status: DC | PRN

## 2018-11-07 MED ORDER — KETOROLAC TROMETHAMINE 30 MG/ML IJ SOLN
15.0000 mg | Freq: Once | INTRAMUSCULAR | Status: AC
  Administered 2018-11-07: 15 mg via INTRAVENOUS
  Filled 2018-11-07: qty 1

## 2018-11-07 MED ORDER — POLYETHYLENE GLYCOL 3350 17 G PO PACK
17.0000 g | PACK | Freq: Every day | ORAL | Status: DC
  Administered 2018-11-07: 17 g via ORAL
  Filled 2018-11-07 (×2): qty 1

## 2018-11-07 MED ORDER — CHLORHEXIDINE GLUCONATE CLOTH 2 % EX PADS
6.0000 | MEDICATED_PAD | Freq: Every day | CUTANEOUS | Status: DC
  Administered 2018-11-07 – 2018-11-08 (×2): 6 via TOPICAL

## 2018-11-07 MED ORDER — MORPHINE SULFATE ER 15 MG PO TBCR
15.0000 mg | EXTENDED_RELEASE_TABLET | Freq: Every day | ORAL | Status: DC
  Administered 2018-11-07 – 2018-11-08 (×2): 15 mg via ORAL
  Filled 2018-11-07 (×5): qty 1

## 2018-11-07 MED ORDER — HYDROMORPHONE HCL 1 MG/ML IJ SOLN
1.0000 mg | INTRAMUSCULAR | Status: DC | PRN
  Filled 2018-11-07: qty 1

## 2018-11-07 NOTE — ED Notes (Signed)
Pt transported to CT ?

## 2018-11-07 NOTE — Progress Notes (Signed)
HEMATOLOGY-ONCOLOGY PROGRESS NOTE  SUBJECTIVE: Jane Brooks presented to the emergency room with abdominal pain, nausea, and vomiting.  States that she was having pain in her mid and upper abdomen which started 2 days prior to coming to the emergency room which gradually got worse.  CT of the abdomen pelvis was performed in the emergency room which showed edematous appearance of the proximal transverse colon and hepatic flexure as well as possibly the sigmoid colon, findings may be infectious or inflammatory but improved compared to the prior CT performed on 10/20/2018.  She also had a moderate to large amount of colonic stool and hepatic steatosis.  States that her abdominal pain improved after bowel movement. States that she moves her bowels on a daily basis.  Nausea and vomiting has resolved.  She is receiving IV Dilaudid which has been effective.  The patient thinks that her abdominal pain has worsened since receiving radiation.  States that she was told by her radiation oncologist to increase her pain medications and to also speak with Dr. Burr Medico regarding ongoing management of her pain medications.  However, the patient canceled her last appointment with Dr. Burr Medico scheduled for 10/30/2018.  The patient stopped MS Contin on her own and has MSIR for breakthrough pain.  She states that she was trying to wean her self off pain medications.  Does not want to take MS Contin.  Only wants to take MSIR. She received a 90-day supply on 10/24/2018 but states that she is currently out of this.    Oncology History Overview Note  Cancer Staging Pancreatic cancer [facility]) Staging form: Exocrine Pancreas, AJCC 8th Edition - Clinical stage from 11/08/2017: Stage IB (cT2, cN0, cM0) - Signed by Truitt Merle, MD on 11/09/2017     Malignant neoplasm of head of pancreas ([place])  10/30/2017 Imaging   MRI Abdomen 10/30/17  IMPRESSION: 1. Marked intra and extrahepatic biliary duct dilatation with abrupt cut off of the common bile  duct at the pancreatic head. This is associated with marked dilatation of the main pancreatic duct also with an abrupt cut off at the level of the pancreatic head. Pancreatic parenchyma in the body and tail of pancreas is markedly atrophic. A subtle 2.5 x 3 cm lesion with differential signal intensity is identified in the head of the pancreas which appears slightly hypoenhancing on postcontrast imaging. Together, these features are highly concerning for pancreatic adenocarcinoma. EUS is recommended to further evaluate. 2. Portal vein is patent although there is some mass-effect on the portal splenic confluence and IVC. Fat planes around celiac axis and SMA appear preserved. 3. Upper normal to borderline portal caval lymph node associated with small right para-aortic lymphadenopathy.     10/30/2017 Tumor Marker   Baseline Ca 19-9 at 140   10/31/2017 Procedure   ERCP by Dr. Lyndel Safe 10/31/17  IMPRESSION Malignant appearing CBD stricture (due to pancreatic mass) status post sphincterotomy and stent insertion.   11/08/2017 Cancer Staging   Staging form: Exocrine Pancreas, AJCC 8th Edition - Clinical stage from 11/08/2017: Stage IB (cT2, cN0, cM0) - Signed by Truitt Merle, MD on 11/09/2017   11/08/2017 Procedure   EUS and EGD by Dr. Ardis Hughs 11/08/17  IMPRESSION -Very vague, irregularly bordered, approximately 2.6cm mass was found in the head of pancreas. This causes biliary and pancreatic duct obstruction. The previously plastic biliary stent is in good position. The mass directly abuts and attenuates the PV/SMV confluence, suggesting invasion. FNA performed and the preliminary cytology review is positive for maligancy (adenocarcinoma). -  [facility] office will arrange referrals to medical and surgical oncology. - Case to be discussed at upcoming multidisciplinary GI tumor conference.   11/08/2017 Initial Biopsy   Biopsy Cytology 11/08/17 Diagnosis FINE NEEDLE ASPIRATION, ENDOSCOPIC,  PANCREAS HEAD (SPECIMEN 1 OF 1 COLLECTED 11/08/17): MALIGNANT CELLS CONSISTENT WITH ADENOCARCINOMA.   11/09/2017 Initial Diagnosis   Pancreatic cancer ([place])   11/15/2017 Imaging   CT Chest and Pelvis WO Contrast 11/15/17 IMPRESSION: 1. No findings of active malignancy in the chest or pelvis. 2. Aortic Atherosclerosis (ICD10-I70.0). Coronary atherosclerosis. 3. Faint centrilobular nodularity in the lung apices, raising the possibility of hypersensitivity pneumonitis or less likely respiratory bronchiolitis. Airway thickening is present, suggesting bronchitis or reactive airways disease.   11/19/2017 -  Chemotherapy   FOLFIRINOX q2weeks with Neulasta injection for 4 months starting 11/19/17. Completed 8 cycles on 03/07/18    02/04/2018 Imaging   CT AP W Contrast 02/04/18  IMPRESSION: 1. No substantial change in size of mass involving head of pancreas. A aortocaval lymph node which appears increased in the interval measuring 1.1 cm versus 0.8 cm previously. Stable 1.2 cm peripancreatic node. 2. Dilated proximal common bile duct is mildly increased in caliber compared with 12/12/2017. The common bile duct stent remains in place in appears to be in appropriate position.    04/19/2018 Imaging   CT AP 04/19/18  IMPRESSION: 1. Essentially stable exam compared to most recent CT scans of 02/04/2018 and 12/12/2017. 2. Stable mass in the head of the pancreas with duct dilatation and atrophy upstream. Mass continues to surround the GDA and contacts the portal vein but is free of the celiac trunk and SMA. Mass probably contacts the RIGHT hepatic artery at the takeoff of the GDA (image 56/5). Dominant LEFT hepatic artery originates from the LEFT gastric. 3. Mild intrahepatic and extrahepatic duct dilatation similar to prior with flexible stent in the common bile duct. 4. No new or progressive disease. 5. No peritoneal metastasis.   04/25/2018 Imaging   CT Angio chest 04/25/18  IMPRESSION: 1.  No demonstrable pulmonary embolus. No thoracic aortic aneurysm or dissection. 2. Areas of atelectatic change bilaterally. No consolidation. No pneumothorax. Endotracheal tube tip is slightly superior to the carina. 3. Enlarged subcarinal lymph node, concerning for neoplastic etiology. 4. 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 Biliary stent present. Biliary duct air is felt to be secondary to the stent placement. 5. Mild upper abdominal ascites. Pneumoperitoneum with apparent drainage catheter present. The pneumoperitoneum is likely of post procedural/postoperative etiology. Bowel perforation in this circumstance cannot be excluded entirely, however. Close clinical assessment in this regard advised. 6.  Left ventricular hypertrophy.   04/25/2018 Surgery   LAPAROSCOPY DIAGNOSTIC and WHIPPLE PROCEDURE, RECONSTRUCTION OF PORTAL VEIN by Dr. Barry Dienes, Dr. Marcello Moores 04/25/18    04/25/2018 Pathology Results    Diagnosis 04/25/18 1. Whipple procedure/resection, with gallbladder with segment of portal vein - INVASIVE ADENOCARCINOMA, MODERATELY DIFFERENTIATED, SPANNING 3.5 CM. - ADENOCARCINOMA IS PRESENT AT THE SUPERIOR MARGIN OF THE ATTACHED VEIN AND THE INFERIOR MARGIN OF THE ATTACHED VEIN (BLOCKS E AND F). - ADENOCARCINOMA EXTENDS INTO PERIPANCREATIC SOFT TISSUE. - PERINEURAL INVASION IS IDENTIFIED, DIFFUSE. - METASTATIC ADENOCARCINOMA IN 1 OF 24 LYMPH NODES (1/24). - SEE ONCOLOGY TABLE BELOW. 2. Lymph node, biopsy, portal - THERE IS NO EVIDENCE OF CARCINOMA IN 5 OF 5 LYMPH NODES (0/5). 3. Pancreas, biopsy, additional pancreatic margin - HIGH GRADE GLANDULAR DYSPLASIA, FOCAL    05/09/2018 Imaging   CT AP W Contrast 05/09/18 IMPRESSION: Status post  Whipple's procedure. 1 surgical drain remains in the area. Pancreatic duct stent is noted.   Irregular low density is noted in the right hepatic lobe consistent with of all the infarction of the right hepatic lobe, as noted  on prior MRI. It appears to be stable in size compared to prior exam.   Wall and fold thickening is seen involving right and transverse colon which may represent edema or possibly inflammation.   Aortic Atherosclerosis (ICD10-I70.0).   06/15/2018 Imaging   CT AP W Contrast 06/15/18 IMPRESSION: Changes of prior Whipple procedure. Previously seen pancreatic duct stent presumably has migrated across the choledocho-jejunostomy and is now located in the biliary ducts. No biliary or pancreatic ductal dilatation.   Severe diffuse low-density throughout the liver, likely fatty infiltration.   Wall thickening within the ascending colon and hepatic flexure concerning for colitis.   Small amount of free fluid in the pelvis.   Aortic atherosclerosis.   07/25/2018 PET scan   PET 07/25/18  IMPRESSION: 1. Right hepatic lobe hypermetabolism, in the setting of heterogeneous hepatic steatosis. Finding is highly suspicious for isolated hepatic metastasis. 2. More equivocal lower level hypermetabolism along the medial hepatic capsule and within the hepatic dome. These areas could be postoperative and physiologic respectively. Recommend attention on follow-up. 3. Coronary artery atherosclerosis. Aortic Atherosclerosis (ICD10-I70.0).   08/13/2018 Pathology Results   Diagnosis 08/13/18 Liver, needle/core biopsy, right - LIVER PARENCHYMA WITH A BILE EXTRAVASATION AND HISTIOCYTES - SEE COMMENT   08/21/2018 Imaging   CT AP W Contrast  IMPRESSION: 1. Diffuse colonic wall thickening and submucosal edema suggestive of colitis, findings are similar to prior study. 2. Interval improvement in the degree of gastric wall thickening and edema compared to prior CT. 3. Small volume ascites, unchanged. 4. Diffuse hepatic steatosis with grossly stable area of non masslike enhancement in the right hepatic lobe. Continued imaging surveillance of this finding is suggested.     10/09/2018 -  Radiation Therapy    Adjuvant radiation with Dr. Lisbeth Renshaw 10/09/18-11/18/18      REVIEW OF SYSTEMS:   As noted in the HPI.   I have reviewed the past medical history, past surgical history, social history and family history with the patient and they are unchanged from previous note.   PHYSICAL EXAMINATION: ECOG PERFORMANCE STATUS: 2 - Symptomatic, <50% confined to bed  Vitals:   11/07/18 0254 11/07/18 0511  BP: 102/74 104/78  Pulse: 73 69  Resp: 16 16  Temp: 98.4 F (36.9 C) 97.8 F (36.6 C)  SpO2: 97% 100%   Filed Weights   11/06/18 2204  Weight: 95 lb (43.1 kg)    Intake/Output from previous day: 10/21 0701 - 10/22 0700 In: 230 [P.O.:180; IV Piggyback:50] Out: 200 [Urine:200]  GENERAL:alert, no distress and comfortable, cachectic SKIN: skin color, texture, turgor are normal, no rashes or significant lesions EYES: normal, Conjunctiva are pink and non-injected, sclera clear OROPHARYNX:no exudate, no erythema and lips, buccal mucosa, and tongue normal  NECK: supple, thyroid normal size, non-tender, without nodularity LYMPH:  no palpable lymphadenopathy in the cervical, axillary or inguinal LUNGS: clear to auscultation and percussion with normal breathing effort HEART: regular rate & rhythm and no murmurs and no lower extremity edema ABDOMEN: Positive bowel sounds, soft, reports tenderness with palpation to the epigastric area. Musculoskeletal:no cyanosis of digits and no clubbing  NEURO: alert & oriented x 3 with fluent speech, no focal motor/sensory deficits  LABORATORY DATA:  I have reviewed the data as listed CMP Latest Ref Rng &  Units 11/06/2018 10/24/2018 10/23/2018  Glucose 70 - 99 mg/dL 105(H) 72 68(L)  BUN 6 - 20 mg/dL 9 <5(L) <5(L)  Creatinine 0.44 - 1.00 mg/dL 0.51 0.46 0.40(L)  Sodium 135 - 145 mmol/L 136 132(L) 134(L)  Potassium 3.5 - 5.1 mmol/L 3.8 3.9 3.6  Chloride 98 - 111 mmol/L 103 99 102  CO2 22 - 32 mmol/L 25 25 18(L)  Calcium 8.9 - 10.3 mg/dL 8.8(L) 7.8(L) 8.0(L)   Total Protein 6.5 - 8.1 g/dL 6.5 - -  Total Bilirubin 0.3 - 1.2 mg/dL 0.7 - -  Alkaline Phos 38 - 126 U/L 98 - -  AST 15 - 41 U/L 27 - -  ALT 0 - 44 U/L 25 - -    Lab Results  Component Value Date   WBC 4.3 11/06/2018   HGB 10.4 (L) 11/06/2018   HCT 32.2 (L) 11/06/2018   MCV 63.8 (L) 11/06/2018   PLT 190 11/06/2018   NEUTROABS 3.6 11/06/2018    Ct Abdomen Pelvis W Contrast  Result Date: 11/07/2018 CLINICAL DATA:  Abdominal pain for 2 days with nausea vomiting EXAM: CT ABDOMEN AND PELVIS WITH CONTRAST TECHNIQUE: Multidetector CT imaging of the abdomen and pelvis was performed using the standard protocol following bolus administration of intravenous contrast. CONTRAST:  22m OMNIPAQUE IOHEXOL 300 MG/ML  SOLN COMPARISON:  October 20, 2018 FINDINGS: Lower chest: The visualized heart size within normal limits. No pericardial fluid/thickening. No hiatal hernia. The visualized portions of the lungs are clear. Hepatobiliary: Again noted is diffuse low density seen throughout the liver parenchyma with heterogeneous enhancement in the posterior right liver lobe.The main portal vein is patent. The patient is status post cholecystectomy. No biliary ductal dilation. Pancreas: There is atrophy within the mid body and tail of the pancreas with a prior whipple procedure. Spleen: Normal in size without focal abnormality. Adrenals/Urinary Tract: Both adrenal glands appear normal. The kidneys and collecting system appear normal without evidence of urinary tract calculus or hydronephrosis. Bladder is unremarkable. Stomach/Bowel: The stomach is grossly unremarkable. There remains diffuse bowel wall thickening and edema within the proximal transverse colon and hepatic flexure. There is also question of mild wall thickening seen in the sigmoid colon. This appears to be improved since the prior exam. There is a moderate to large amount of colonic stool present.The appendix is normal. Vascular/Lymphatic: There are no  enlarged mesenteric, retroperitoneal, or pelvic lymph nodes. Scattered aortic atherosclerotic calcifications are seen without aneurysmal dilatation. Reproductive: The uterus and adnexa are unremarkable. Other: No evidence of abdominal wall mass or hernia. Musculoskeletal: No acute or significant osseous findings. IMPRESSION: 1. There remains edematous appearance to the proximal transverse colon and hepatic flexure as well as possibly the sigmoid colon. Findings may be infectious or inflammatory. This however overall appears to be improved since the prior exam of October 20, 2018. 2. Moderate to large amount of colonic stool. 3. Hepatic steatosis with heterogeneous enhancement. Electronically Signed   By: BPrudencio PairM.D.   On: 11/07/2018 01:16    ASSESSMENT AND PLAN: 1.  Recurrent abdominal pain with nausea and vomiting, improved 2.  Colitis, improved, status post colonoscopy on 10/23/2018 biopsies negative for active inflammation or other abnormalities, HSV and CMV negative 3.  Pancreatic adenocarcinoma, cT2 N0 M0, stage Ib status post chemotherapy and Whipple procedure.  Currently receiving radiation. 4.  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  Decreased appetite and weight loss 6.  Constipation, resolved  -Nausea and vomiting has resolved.  I confirmed with the patient that she has antiemetics at home to take should she develop any nausea or vomiting. -Pain is currently well controlled with IV pain medication.  I have discussed her pain medications with her and I have advised her to restart MS Contin 15 mg twice a day and to continue MSIR.  She was previously taking 15 mg p.o. every 6 hours as needed for pain.  She has been taking this more frequently and thinks her pain is related to radiation.  Her dose has been changed to 15 mg every 4 hours as needed for pain.  We will see how well this controls her pain.  I would recommend upon discharge that she  continue MS Contin 15 mg twice a day and MSIR-dose and frequency to be adjusted during hospitalization.  I have advised the patient not to take more pain medication than prescribed without talking with our office. -CT scan reviewed and no evidence of pancreatic cancer recurrence. -Anemia is stable.  No transfusion is indicated. -Continue Marinol for decreased appetite and weight loss. -I have recommended a bowel regimen to the patient since she is on opioid pain medication.  She is currently on MiraLAX daily.  May need to add stool softener if MiraLAX is not effective. -I have requested follow-up at the cancer center next week with Dr. Burr Medico or her nurse practitioner Regan Rakers.  The patient was advised to keep her follow-up appointments as scheduled.  Please call Oncology for questions.    LOS: 0 days   Mikey Bussing, DNP, AGPCNP-BC, AOCNP 11/07/18

## 2018-11-07 NOTE — H&P (Signed)
History and Physical    Jane Brooks KCL:[id number] DOB: 03-04-1970 DOA: 11/06/2018  PCP: Patient, No Pcp Per   Patient coming from: Home   Chief Complaint: Abdominal pain, N/V   HPI: Jane Brooks is a 48 y.o. female with medical history significant for pancreatic cancer status post Whipple procedure and chemotherapy, currently undergoing radiation therapy, chronic pain, and now presenting to the ED for evaluation of abdominal pain, nausea, and vomiting.  Patient reports that she developed pain in her mid and upper abdomen 2 days ago that has become severe.  She has also had nausea with nonbloody vomiting today.  She reports that symptoms are essentially the same as those that led to her to recent hospitalizations except that she had been having diarrhea previously but that is now resolved.  Her abdominal pain, nausea, and vomiting had all resolved completely at time of recent hospital discharge and she continued to do well for 10 days or so before the recurrence in pain, nausea, and vomiting.  She has not experienced any fevers or chills.  She reports normal daily bowel movements without melena or hematochezia.  She reports stopping her long-acting morphine recently and attempting to reduce her short acting morphine use.  ED Course: Upon arrival to the ED, patient is found to be afebrile, saturating well on room air, tachycardic in the 110s, and with stable blood pressure.  Chemistry panel is unremarkable and CBC notable for stable microcytic anemia.  Urinalysis with elevated specific gravity, ketonuria, and proteinuria.  COVID-19 testing is negative.  CT of the abdomen and pelvis demonstrates edematous appearance to the proximal transverse colon and hepatic flexure, could reflect an inflammatory or infectious process, but is improved from the scan earlier this month.  Patient was treated with multiple doses of Zofran, multiple doses Dilaudid, Toradol, and Zosyn in the ED.  Review of Systems:   All other systems reviewed and apart from HPI, are negative.  Past Medical History:  Diagnosis Date  . Family history of lung cancer   . Family history of prostate cancer   . Family history of uterine cancer   . Gallstones 10/2017  . Pancreatic cancer ([place])   . Pneumothorax, closed, traumatic    years ago  . PONV (postoperative nausea and vomiting)     Past Surgical History:  Procedure Laterality Date  . BILIARY STENT PLACEMENT  10/31/2017   Procedure: BILIARY STENT PLACEMENT;  Surgeon: [name], MD;  Location: [facility] ENDOSCOPY;  Service: Gastroenterology;;  . BIOPSY  10/23/2018   Procedure: BIOPSY;  Surgeon: [name], MD;  Location: [facility] ENDOSCOPY;  Service: Endoscopy;;  . COLONOSCOPY WITH PROPOFOL N/A 10/23/2018   Procedure: COLONOSCOPY WITH PROPOFOL;  Surgeon: [name], MD;  Location: [facility] ENDOSCOPY;  Service: Endoscopy;  Laterality: N/A;  . ERCP N/A 10/31/2017   Procedure: ENDOSCOPIC RETROGRADE CHOLANGIOPANCREATOGRAPHY (ERCP);  Surgeon: [name], MD;  Location: [facility] ENDOSCOPY;  Service: Gastroenterology;  Laterality: N/A;  . ESOPHAGOGASTRODUODENOSCOPY N/A 11/08/2017   Procedure: ESOPHAGOGASTRODUODENOSCOPY (EGD);  Surgeon: Milus Banister, MD;  Location: [facility] ENDOSCOPY;  Service: Endoscopy;  Laterality: N/A;  . EUS N/A 11/08/2017   Procedure: UPPER ENDOSCOPIC ULTRASOUND (EUS) RADIAL;  Surgeon: Milus Banister, MD;  Location: [facility] ENDOSCOPY;  Service: Endoscopy;  Laterality: N/A;  . FINE NEEDLE ASPIRATION  11/08/2017   Procedure: FINE NEEDLE ASPIRATION;  Surgeon: Milus Banister, MD;  Location: [facility] ENDOSCOPY;  Service: Endoscopy;;  . IR IMAGING GUIDED PORT INSERTION  11/15/2017  . LAPAROSCOPY N/A 04/25/2018   Procedure: LAPAROSCOPY  DIAGNOSTIC;  Surgeon: Stark Klein, MD;  Location: [facility];  Service: General;  Laterality: N/A;  GENERAL AND EPIDURAL ANESTHESIA  . SPHINCTEROTOMY  10/31/2017   Procedure: SPHINCTEROTOMY;  Surgeon: [name], MD;  Location: [facility] ENDOSCOPY;   Service: Gastroenterology;;  . TONSILLECTOMY    . WHIPPLE PROCEDURE N/A 04/25/2018   Procedure: WHIPPLE PROCEDURE, RECONSTRUCTION OF PORTAL VEIN;  Surgeon: Stark Klein, MD;  Location: [facility];  Service: General;  Laterality: N/A;     reports that she has been smoking cigarettes. She has a 37.00 pack-year smoking history. She has never used smokeless tobacco. She reports previous alcohol use. She reports current drug use. Drug: Marijuana.  Allergies  Allergen Reactions  . Latex Hives    Family History  Problem Relation Age of Onset  . Diabetes Mother   . Hypertension Mother   . COPD Mother   . Uterine cancer Mother 31       had hysterectomy  . Diabetes Sister   . Hypertension Sister   . Lung cancer Maternal Grandmother        lung cancer  . Hypertension Sister      Prior to Admission medications   Medication Sig Start Date End Date Taking? Authorizing Provider  cyclobenzaprine (FEXMID) 7.5 MG tablet Take 1 tablet (7.5 mg total) by mouth 3 (three) times daily as needed for muscle spasms. 10/11/18  Yes Truitt Merle, MD  dronabinol (MARINOL) 2.5 MG capsule Take 1 capsule (2.5 mg total) by mouth 2 (two) times daily before lunch and supper. 09/24/18  Yes Truitt Merle, MD  gabapentin (NEURONTIN) 300 MG capsule Take 1 capsule (300 mg total) by mouth 3 (three) times daily. 09/02/18  Yes Truitt Merle, MD  lipase/protease/amylase (CREON) 36000 UNITS CPEP capsule Take 36,000 Units by mouth 3 (three) times daily before meals.   Yes [provider]  morphine (MS CONTIN) 15 MG 12 hr tablet Take 1 tablet (15 mg total) by mouth daily. Plan to wean off in the next month 09/24/18  Yes Truitt Merle, MD  morphine (MSIR) 15 MG tablet Take 1 tablet (15 mg total) by mouth every 6 (six) hours as needed for severe pain. 10/24/18  Yes Truitt Merle, MD  ondansetron (ZOFRAN ODT) 8 MG disintegrating tablet Take 1 tablet (8 mg total) by mouth every 8 (eight) hours as needed for nausea or vomiting. 10/11/18  Yes Truitt Merle, MD   potassium chloride SA (KLOR-CON) 20 MEQ tablet Take 20 mEq by mouth daily.   Yes [provider]  diphenoxylate-atropine (LOMOTIL) 2.5-0.025 MG tablet Take 1 to 2 tablets PO QID prn diarrhea Patient not taking: Reported on 10/22/2018 08/22/18   Harle Stanford., PA-C  metoCLOPramide (REGLAN) 5 MG tablet Take 1 tablet (5 mg total) by mouth every 6 (six) hours as needed for nausea or vomiting. Patient not taking: Reported on 11/06/2018 07/23/18   Alla Feeling, NP  pantoprazole (PROTONIX) 40 MG tablet Take 1 tablet (40 mg total) by mouth daily. Patient not taking: Reported on 11/06/2018 08/20/18   Truitt Merle, MD  potassium chloride SA (KLOR-CON) 20 MEQ tablet Take 1 tablet (20 mEq total) by mouth 2 (two) times daily for 5 days. 10/21/18 10/26/18  Antonieta Pert, MD  promethazine (PHENERGAN) 25 MG tablet Take 1 tablet (25 mg total) by mouth every 6 (six) hours as needed for nausea or vomiting. Patient not taking: Reported on 11/06/2018 06/19/18   Stark Klein, MD    Physical Exam: Vitals:   11/07/18 0130 11/07/18 0200  11/07/18 0230 11/07/18 0254  BP: 109/78 100/70 100/72 102/74  Pulse: 80 76 78 73  Resp: 17  16 16   Temp:    98.4 F (36.9 C)  TempSrc:    Oral  SpO2: 98% 99% 99% 97%  Weight:      Height:        Constitutional: NAD, calm  Eyes: PERTLA, lids and conjunctivae normal ENMT: Mucous membranes are moist. Posterior pharynx clear of any exudate or lesions.   Neck: normal, supple, no masses, no thyromegaly Respiratory: no wheezing, no crackles. Normal respiratory effort. No accessory muscle use.  Cardiovascular: S1 & S2 heard, regular rate and rhythm. No extremity edema.  Abdomen: No distension, soft, tender in mid- and upper abdomen without rebound pain or guarding. Bowel sounds active.  Musculoskeletal: no clubbing / cyanosis. No joint deformity upper and lower extremities.   Skin: no significant rashes, lesions, ulcers. Warm, dry, well-perfused. Neurologic: No facial asymmetry.  Sensation intact. Moving all extremities.  Psychiatric: Alert and oriented x 3. Pleasant, cooperative.    Labs on Admission: I have personally reviewed following labs and imaging studies  CBC: Recent Labs  Lab 11/06/18 2236  WBC 4.3  NEUTROABS 3.6  HGB 10.4*  HCT 32.2*  MCV 63.8*  PLT 283   Basic Metabolic Panel: Recent Labs  Lab 11/06/18 2236  NA 136  K 3.8  CL 103  CO2 25  GLUCOSE 105*  BUN 9  CREATININE 0.51  CALCIUM 8.8*   GFR: Estimated Creatinine Clearance: 54.3 mL/min (by C-G formula based on SCr of 0.51 mg/dL). Liver Function Tests: Recent Labs  Lab 11/06/18 2236  AST 27  ALT 25  ALKPHOS 98  BILITOT 0.7  PROT 6.5  ALBUMIN 3.2*   Recent Labs  Lab 11/06/18 2236  LIPASE 17   No results for input(s): AMMONIA in the last 168 hours. Coagulation Profile: No results for input(s): INR, PROTIME in the last 168 hours. Cardiac Enzymes: No results for input(s): CKTOTAL, CKMB, CKMBINDEX, TROPONINI in the last 168 hours. BNP (last 3 results) No results for input(s): PROBNP in the last 8760 hours. HbA1C: No results for input(s): HGBA1C in the last 72 hours. CBG: No results for input(s): GLUCAP in the last 168 hours. Lipid Profile: No results for input(s): CHOL, HDL, LDLCALC, TRIG, CHOLHDL, LDLDIRECT in the last 72 hours. Thyroid Function Tests: No results for input(s): TSH, T4TOTAL, FREET4, T3FREE, THYROIDAB in the last 72 hours. Anemia Panel: No results for input(s): VITAMINB12, FOLATE, FERRITIN, TIBC, IRON, RETICCTPCT in the last 72 hours. Urine analysis:    Component Value Date/Time   COLORURINE AMBER (A) 11/06/2018 2236   APPEARANCEUR HAZY (A) 11/06/2018 2236   LABSPEC 1.044 (H) 11/06/2018 2236   PHURINE 5.0 11/06/2018 2236   GLUCOSEU NEGATIVE 11/06/2018 2236   HGBUR NEGATIVE 11/06/2018 2236   BILIRUBINUR NEGATIVE 11/06/2018 2236   KETONESUR 5 (A) 11/06/2018 2236   PROTEINUR 30 (A) 11/06/2018 2236   NITRITE NEGATIVE 11/06/2018 2236    LEUKOCYTESUR NEGATIVE 11/06/2018 2236   Sepsis Labs: @LABRCNTIP (procalcitonin:4,lacticidven:4) ) Recent Results (from the past 240 hour(s))  SARS Coronavirus 2 by RT PCR (hospital order, performed in [place] hospital lab) Nasopharyngeal Nasopharyngeal Swab     Status: None   Collection Time: 11/06/18 11:38 PM   Specimen: Nasopharyngeal Swab  Result Value Ref Range Status   SARS Coronavirus 2 NEGATIVE NEGATIVE Final    Comment: (NOTE) If result is NEGATIVE SARS-CoV-2 target nucleic acids are NOT DETECTED. The SARS-CoV-2 RNA is generally detectable  in upper and lower  respiratory specimens during the acute phase of infection. The lowest  concentration of SARS-CoV-2 viral copies this assay can detect is 250  copies / mL. A negative result does not preclude SARS-CoV-2 infection  and should not be used as the sole basis for treatment or other  patient management decisions.  A negative result may occur with  improper specimen collection / handling, submission of specimen other  than nasopharyngeal swab, presence of viral mutation(s) within the  areas targeted by this assay, and inadequate number of viral copies  (<250 copies / mL). A negative result must be combined with clinical  observations, patient history, and epidemiological information. If result is POSITIVE SARS-CoV-2 target nucleic acids are DETECTED. The SARS-CoV-2 RNA is generally detectable in upper and lower  respiratory specimens dur ing the acute phase of infection.  Positive  results are indicative of active infection with SARS-CoV-2.  Clinical  correlation with patient history and other diagnostic information is  necessary to determine patient infection status.  Positive results do  not rule out bacterial infection or co-infection with other viruses. If result is PRESUMPTIVE POSTIVE SARS-CoV-2 nucleic acids MAY BE PRESENT.   A presumptive positive result was obtained on the submitted specimen  and confirmed on  repeat testing.  While 2019 novel coronavirus  (SARS-CoV-2) nucleic acids may be present in the submitted sample  additional confirmatory testing may be necessary for epidemiological  and / or clinical management purposes  to differentiate between  SARS-CoV-2 and other Sarbecovirus currently known to infect humans.  If clinically indicated additional testing with an alternate test  methodology [phone]) is advised. The SARS-CoV-2 RNA is generally  detectable in upper and lower respiratory sp ecimens during the acute  phase of infection. The expected result is Negative. Fact Sheet for Patients:  StrictlyIdeas.no Fact Sheet for Healthcare Providers: BankingDealers.co.za This test is not yet approved or cleared by the [place] FDA and has been authorized for detection and/or diagnosis of SARS-CoV-2 by FDA under an Emergency Use Authorization (EUA).  This EUA will remain in effect (meaning this test can be used) for the duration of the COVID-19 declaration under Section 564(b)(1) of the Act, 21 U.S.C. section 360bbb-3(b)(1), unless the authorization is terminated or revoked sooner. Performed at [facility], [place] [address], [place], [place] [address]      Radiological Exams on Admission: Ct Abdomen Pelvis W Contrast  Result Date: 11/07/2018 CLINICAL DATA:  Abdominal pain for 2 days with nausea vomiting EXAM: CT ABDOMEN AND PELVIS WITH CONTRAST TECHNIQUE: Multidetector CT imaging of the abdomen and pelvis was performed using the standard protocol following bolus administration of intravenous contrast. CONTRAST:  61m OMNIPAQUE IOHEXOL 300 MG/ML  SOLN COMPARISON:  October 20, 2018 FINDINGS: Lower chest: The visualized heart size within normal limits. No pericardial fluid/thickening. No hiatal hernia. The visualized portions of the lungs are clear. Hepatobiliary: Again noted is diffuse low density seen throughout the liver  parenchyma with heterogeneous enhancement in the posterior right liver lobe.The main portal vein is patent. The patient is status post cholecystectomy. No biliary ductal dilation. Pancreas: There is atrophy within the mid body and tail of the pancreas with a prior whipple procedure. Spleen: Normal in size without focal abnormality. Adrenals/Urinary Tract: Both adrenal glands appear normal. The kidneys and collecting system appear normal without evidence of urinary tract calculus or hydronephrosis. Bladder is unremarkable. Stomach/Bowel: The stomach is grossly unremarkable. There remains diffuse bowel wall thickening and edema within the  proximal transverse colon and hepatic flexure. There is also question of mild wall thickening seen in the sigmoid colon. This appears to be improved since the prior exam. There is a moderate to large amount of colonic stool present.The appendix is normal. Vascular/Lymphatic: There are no enlarged mesenteric, retroperitoneal, or pelvic lymph nodes. Scattered aortic atherosclerotic calcifications are seen without aneurysmal dilatation. Reproductive: The uterus and adnexa are unremarkable. Other: No evidence of abdominal wall mass or hernia. Musculoskeletal: No acute or significant osseous findings. IMPRESSION: 1. There remains edematous appearance to the proximal transverse colon and hepatic flexure as well as possibly the sigmoid colon. Findings may be infectious or inflammatory. This however overall appears to be improved since the prior exam of October 20, 2018. 2. Moderate to large amount of colonic stool. 3. Hepatic steatosis with heterogeneous enhancement. Electronically Signed   By: Prudencio Pair M.D.   On: 11/07/2018 01:16    EKG: Not performed.   Assessment/Plan   1. Intractable abdominal pain; nausea & vomiting; chronic pain - Presents with 2 days of severe abdominal pain and one day of N/V, described as same as sxs that led to recent admissions  - There is edematous  appearance to colon again noted on CT, unclear etiology of this and unclear if related to current sxs as the edema appears improved despite acute worsening in pain and nausea  - She was evaluated for this with colonoscopy earlier this month with minimal sigmoid diverticulosis, otherwise normal, and with biopsies negative for active inflammation or other abnormality, and negative for HSV and CMV - She reports stopping her long-acting morphine and trying to cut back on her short-acting morphine use and withdrawal may be contributing to the current presentation  - Continue pain-control, antiemetics, bowel regimen, IVF hydration, monitor electrolytes, advance diet as tolerated   2. Pancreatic cancer  - She is s/p chemotherapy, Whipple procedure in April 2020, currently undergoing adjuvant radiation  - She will continue oncology follow-up   3. Microcytic anemia  - Appears stable, no bleeding     PPE: Mask, face shield  DVT prophylaxis: Lovenox  Code Status: Full  Family Communication: Discussed with patient  Consults called: None  Admission status: Observation     Vianne Bulls, MD [facility] Pager [phone]  If 7PM-7AM, please contact night-coverage [email or web address] Password [email or web address]  11/07/2018, 3:02 AM

## 2018-11-07 NOTE — Progress Notes (Signed)
Ms. Aveni was admitted by my colleague Dr. Myna Hidalgo early this morning. She was placed on IV dilaudid to address her uncontrolled pain. The patient's vitals have been within acceptable range. Her labs are likewise in acceptable range. She is tolerating her clear liquid diet. I have restarted her home pain medication regimen and decreased her IV dilauded. It should only be given for pain not covered by oral medication. I have discussed the patient in detail with Mikey Bussing, NP for oncology. I appreciate her assistance.   The patient is awake and alert. She is in no acute distress. Heart and lung exam are within normal limits. Her abdomen is soft, non-tender, and non-distended. She had a BM this morning.

## 2018-11-07 NOTE — Progress Notes (Signed)
Lovenox per Pharmacy for DVT Prophylaxis    Pharmacy has been consulted from dosing enoxaparin (lovenox) in this patient for DVT prophylaxis.  The pharmacist has reviewed pertinent labs (Hgb _10.4__; PLT_190__), patient weight (_43__kg) and renal function (CrCl_54__mL/min) and decided that enoxaparin 30__mg SQ Q24Hrs is appropriate for this patient.  The pharmacy department will sign off at this time.  Please reconsult pharmacy if status changes or for further issues.  Thank you  Cyndia Diver PharmD, BCPS  11/07/2018, 3:04 AM

## 2018-11-08 ENCOUNTER — Ambulatory Visit
Admission: RE | Admit: 2018-11-08 | Discharge: 2018-11-08 | Disposition: A | Discharge status: Home / Self Care | Payer: Medicaid Other | Source: Ambulatory Visit | Attending: Radiation Oncology | Admitting: Radiation Oncology

## 2018-11-08 DIAGNOSIS — Z510 Encounter for antineoplastic radiation therapy: Secondary | ICD-10-CM | POA: Diagnosis not present

## 2018-11-08 DIAGNOSIS — R109 Unspecified abdominal pain: Secondary | ICD-10-CM | POA: Diagnosis not present

## 2018-11-08 MED ORDER — POLYETHYLENE GLYCOL 3350 17 G PO PACK: 17.0000 g | PACK | Freq: Every day | ORAL | 0 refills | Status: AC

## 2018-11-08 MED ORDER — MORPHINE SULFATE ER 15 MG PO TBCR: 15.0000 mg | EXTENDED_RELEASE_TABLET | Freq: Two times a day (BID) | ORAL | 0 refills | Status: DC

## 2018-11-08 MED ORDER — ACETAMINOPHEN 325 MG PO TABS: 650.0000 mg | ORAL_TABLET | Freq: Four times a day (QID) | ORAL | 0 refills | Status: AC | PRN

## 2018-11-08 MED ORDER — MORPHINE SULFATE 15 MG PO TABS: 15.0000 mg | ORAL_TABLET | ORAL | 0 refills | Status: DC | PRN

## 2018-11-08 MED ORDER — HEPARIN SOD (PORK) LOCK FLUSH 100 UNIT/ML IV SOLN
500.0000 [IU] | INTRAVENOUS | Status: AC | PRN
  Administered 2018-11-08: 500 [IU]
  Filled 2018-11-08: qty 5

## 2018-11-08 MED FILL — MORPHINE SULF ER 15 MG TAB: 15 | 4 days supply | Qty: 8 | Fill #0

## 2018-11-08 MED FILL — MORPHINE SULFATE IR 15 MG T: 15 | 2 days supply | Qty: 16 | Fill #0

## 2018-11-08 NOTE — Progress Notes (Signed)
Pt alert and oriented. Tolerating diet. Pt d/cd home.

## 2018-11-08 NOTE — Plan of Care (Signed)
All goals met d/c

## 2018-11-08 NOTE — Discharge Summary (Signed)
Physician Discharge Summary  Jane Brooks WLS:[id number] DOB: August 01, 1970 DOA: 11/06/2018  PCP: Patient, No Pcp Per  Admit date: 11/06/2018 Discharge date: 11/08/2018  Recommendations for Outpatient Follow-up:  1. Follow up with Dr. Burr Medico on Monday. Keep all future appointments with Dr. Burr Medico. 2. Follow up with PCP in 7-10 days. 3. BMP to be drawn on 11/11/2018. Report results to PCP and Dr. Burr Medico.  Discharge Diagnoses: Principal diagnosis is #1 1. Uncontrolled pain 2. Intractable nausea and vomiting, concern for opiate withdrawal 3. Pancreatic cancer 4. Microcytic anemia  Discharge Condition: Fair  Disposition: Home  Diet recommendation: Regular  Filed Weights   11/06/18 2204  Weight: 43.1 kg    History of present illness:  Jane Brooks is a 48 y.o. female with medical history significant for pancreatic cancer status post Whipple procedure and chemotherapy, currently undergoing radiation therapy, chronic pain, and now presenting to the ED for evaluation of abdominal pain, nausea, and vomiting.  Patient reports that she developed pain in her mid and upper abdomen 2 days ago that has become severe.  She has also had nausea with nonbloody vomiting today.  She reports that symptoms are essentially the same as those that led to her to recent hospitalizations except that she had been having diarrhea previously but that is now resolved.  Her abdominal pain, nausea, and vomiting had all resolved completely at time of recent hospital discharge and she continued to do well for 10 days or so before the recurrence in pain, nausea, and vomiting.  She has not experienced any fevers or chills.  She reports normal daily bowel movements without melena or hematochezia.  She reports stopping her long-acting morphine recently and attempting to reduce her short acting morphine use.  ED Course: Upon arrival to the ED, patient is found to be afebrile, saturating well on room air, tachycardic in the  110s, and with stable blood pressure.  Chemistry panel is unremarkable and CBC notable for stable microcytic anemia.  Urinalysis with elevated specific gravity, ketonuria, and proteinuria.  COVID-19 testing is negative.  CT of the abdomen and pelvis demonstrates edematous appearance to the proximal transverse colon and hepatic flexure, could reflect an inflammatory or infectious process, but is improved from the scan earlier this month.  Patient was treated with multiple doses of Zofran, multiple doses Dilaudid, Toradol, and Zosyn in the ED.  Hospital Course:  The patient was admitted to a medical bed. She was placed on IV dilaudid overnight, but was returned to her home pain regimen yesterday morning. Nausea and vomiting is resolved. She is tolerating her diet. She states that she is comfortable and wants to go home. The patient is reminded that she must take the medications only as prescribed, and that she will only be given enough to get her through the weekend. She will need to follow up with Dr. Burr Medico with whom she has recently cancelled an appointment.   Today's assessment: S: The patient is resting comfortably. No new complaints. O: Vitals:  Vitals:   11/07/18 2131 11/08/18 0401  BP: 120/82 114/82  Pulse: 78 77  Resp: 18 18  Temp: 98.2 F (36.8 C) 98.6 F (37 C)  SpO2: 99% 100%   Exam:  Constitutional:  . The patient is awake, alert, and oriented x 3. No acute distress. Respiratory:  . No increased work of breathing. . No wheezes, rales, or rhonchi . No tactile fremitus Cardiovascular:  . Regular rate and rhythm . No murmurs, ectopy, or gallups. . No lateral PMI.  No thrills. Abdomen:  . Abdomen is soft, non-tender, non-distended . No hernias, masses, or organomegaly . Normoactive bowel sounds.  Musculoskeletal:  . No cyanosis, clubbing, or edema Skin:  . No rashes, lesions, ulcers . palpation of skin: no induration or nodules Neurologic:  . CN 2-12 intact . Sensation  all 4 extremities intact Psychiatric:  . Mental status o Mood, affect appropriate o Orientation to person, place, time  . judgment and insight appear intact  Discharge Instructions  Discharge Instructions    Activity as tolerated - No restrictions   Complete by: As directed    Call MD for:  persistant nausea and vomiting   Complete by: As directed    Call MD for:  severe uncontrolled pain   Complete by: As directed    Diet - low sodium heart healthy   Complete by: As directed    Discharge instructions   Complete by: As directed    Follow up with Dr. Burr Medico on Monday. Keep all future appointments with Dr. Burr Medico. Follow up with PCP in 7-10 days. BMP to be drawn on 11/11/2018. Report results to PCP and Dr. Burr Medico.   Increase activity slowly   Complete by: As directed      Allergies as of 11/08/2018      Reactions   Latex Hives      Medication List    STOP taking these medications   metoCLOPramide 5 MG tablet Commonly known as: REGLAN     TAKE these medications   acetaminophen 325 MG tablet Commonly known as: TYLENOL Take 2 tablets (650 mg total) by mouth every 6 (six) hours as needed for mild pain (or Fever >/= 101).   Creon 36000 UNITS Cpep capsule Generic drug: lipase/protease/amylase Take 36,000 Units by mouth 3 (three) times daily before meals.   cyclobenzaprine 7.5 MG tablet Commonly known as: FEXMID Take 1 tablet (7.5 mg total) by mouth 3 (three) times daily as needed for muscle spasms.   diphenoxylate-atropine 2.5-0.025 MG tablet Commonly known as: LOMOTIL Take 1 to 2 tablets PO QID prn diarrhea   dronabinol 2.5 MG capsule Commonly known as: MARINOL Take 1 capsule (2.5 mg total) by mouth 2 (two) times daily before lunch and supper.   gabapentin 300 MG capsule Commonly known as: NEURONTIN Take 1 capsule (300 mg total) by mouth 3 (three) times daily.   morphine 15 MG 12 hr tablet Commonly known as: MS CONTIN Take 1 tablet (15 mg total) by mouth every 12  (twelve) hours. What changed:   when to take this  additional instructions   morphine 15 MG tablet Commonly known as: MSIR Take 1 tablet (15 mg total) by mouth every 4 (four) hours as needed for severe pain. What changed: when to take this   ondansetron 8 MG disintegrating tablet Commonly known as: Zofran ODT Take 1 tablet (8 mg total) by mouth every 8 (eight) hours as needed for nausea or vomiting.   pantoprazole 40 MG tablet Commonly known as: PROTONIX Take 1 tablet (40 mg total) by mouth daily.   polyethylene glycol 17 g packet Commonly known as: MIRALAX / GLYCOLAX Take 17 g by mouth daily. Start taking on: November 09, 2018   potassium chloride SA 20 MEQ tablet Commonly known as: KLOR-CON Take 20 mEq by mouth daily. What changed: Another medication with the same name was removed. Continue taking this medication, and follow the directions you see here.   promethazine 25 MG tablet Commonly known as: PHENERGAN Take 1 tablet (25  mg total) by mouth every 6 (six) hours as needed for nausea or vomiting.      Allergies  Allergen Reactions  . Latex Hives    The results of significant diagnostics from this hospitalization (including imaging, microbiology, ancillary and laboratory) are listed below for reference.    Significant Diagnostic Studies: Ct Abdomen Pelvis W Contrast  Result Date: 11/07/2018 CLINICAL DATA:  Abdominal pain for 2 days with nausea vomiting EXAM: CT ABDOMEN AND PELVIS WITH CONTRAST TECHNIQUE: Multidetector CT imaging of the abdomen and pelvis was performed using the standard protocol following bolus administration of intravenous contrast. CONTRAST:  6m OMNIPAQUE IOHEXOL 300 MG/ML  SOLN COMPARISON:  October 20, 2018 FINDINGS: Lower chest: The visualized heart size within normal limits. No pericardial fluid/thickening. No hiatal hernia. The visualized portions of the lungs are clear. Hepatobiliary: Again noted is diffuse low density seen throughout the  liver parenchyma with heterogeneous enhancement in the posterior right liver lobe.The main portal vein is patent. The patient is status post cholecystectomy. No biliary ductal dilation. Pancreas: There is atrophy within the mid body and tail of the pancreas with a prior whipple procedure. Spleen: Normal in size without focal abnormality. Adrenals/Urinary Tract: Both adrenal glands appear normal. The kidneys and collecting system appear normal without evidence of urinary tract calculus or hydronephrosis. Bladder is unremarkable. Stomach/Bowel: The stomach is grossly unremarkable. There remains diffuse bowel wall thickening and edema within the proximal transverse colon and hepatic flexure. There is also question of mild wall thickening seen in the sigmoid colon. This appears to be improved since the prior exam. There is a moderate to large amount of colonic stool present.The appendix is normal. Vascular/Lymphatic: There are no enlarged mesenteric, retroperitoneal, or pelvic lymph nodes. Scattered aortic atherosclerotic calcifications are seen without aneurysmal dilatation. Reproductive: The uterus and adnexa are unremarkable. Other: No evidence of abdominal wall mass or hernia. Musculoskeletal: No acute or significant osseous findings. IMPRESSION: 1. There remains edematous appearance to the proximal transverse colon and hepatic flexure as well as possibly the sigmoid colon. Findings may be infectious or inflammatory. This however overall appears to be improved since the prior exam of October 20, 2018. 2. Moderate to large amount of colonic stool. 3. Hepatic steatosis with heterogeneous enhancement. Electronically Signed   By: BPrudencio PairM.D.   On: 11/07/2018 01:16    Microbiology: Recent Results (from the past 240 hour(s))  SARS Coronavirus 2 by RT PCR [facility] Nasopharyngeal Nasopharyngeal Swab     Status: None   Collection Time: 11/06/18 11:38 PM   Specimen:  Nasopharyngeal Swab  Result Value Ref Range Status   SARS Coronavirus 2 NEGATIVE NEGATIVE Final    Comment: (NOTE) If result is NEGATIVE SARS-CoV-2 target nucleic acids are NOT DETECTED. The SARS-CoV-2 RNA is generally detectable in upper and lower  respiratory specimens during the acute phase of infection. The lowest  concentration of SARS-CoV-2 viral copies this assay can detect is 250  copies / mL. A negative result does not preclude SARS-CoV-2 infection  and should not be used as the sole basis for treatment or other  patient management decisions.  A negative result may occur with  improper specimen collection / handling, submission of specimen other  than nasopharyngeal swab, presence of viral mutation(s) within the  areas targeted by this assay, and inadequate number of viral copies  (<250 copies / mL). A negative result must be combined with clinical  observations, patient history, and epidemiological information. If  result is POSITIVE SARS-CoV-2 target nucleic acids are DETECTED. The SARS-CoV-2 RNA is generally detectable in upper and lower  respiratory specimens dur ing the acute phase of infection.  Positive  results are indicative of active infection with SARS-CoV-2.  Clinical  correlation with patient history and other diagnostic information is  necessary to determine patient infection status.  Positive results do  not rule out bacterial infection or co-infection with other viruses. If result is PRESUMPTIVE POSTIVE SARS-CoV-2 nucleic acids MAY BE PRESENT.   A presumptive positive result was obtained on the submitted specimen  and confirmed on repeat testing.  While 2019 novel coronavirus  (SARS-CoV-2) nucleic acids may be present in the submitted sample  additional confirmatory testing may be necessary for epidemiological  and / or clinical management purposes  to differentiate between  SARS-CoV-2 and other Sarbecovirus currently known to infect humans.  If clinically  indicated additional testing with an alternate test  methodology [phone]) is advised. The SARS-CoV-2 RNA is generally  detectable in upper and lower respiratory sp ecimens during the acute  phase of infection. The expected result is Negative. Fact Sheet for Patients:  StrictlyIdeas.no Fact Sheet for Healthcare Providers: BankingDealers.co.za This test is not yet approved or cleared by the [place] FDA and has been authorized for detection and/or diagnosis of SARS-CoV-2 by FDA under an Emergency Use Authorization (EUA).  This EUA will remain in effect (meaning this test can be used) for the duration of the COVID-19 declaration under Section 564(b)(1) of the Act, 21 U.S.C. section 360bbb-3(b)(1), unless the authorization is terminated or revoked sooner. Performed at [facility], [place] [address] [place], [place] [address]      Labs: Basic Metabolic Panel: Recent Labs  Lab 11/06/18 2236 11/07/18 0946  NA 136 136  K 3.8 3.6  CL 103 102  CO2 25 27  GLUCOSE 105* 74  BUN 9 10  CREATININE 0.51 0.51  CALCIUM 8.8* 8.3*   Liver Function Tests: Recent Labs  Lab 11/06/18 2236 11/07/18 0946  AST 27 42*  ALT 25 27  ALKPHOS 98 90  BILITOT 0.7 0.5  PROT 6.5 5.4*  ALBUMIN 3.2* 2.9*   Recent Labs  Lab 11/06/18 2236  LIPASE 17   No results for input(s): AMMONIA in the last 168 hours. CBC: Recent Labs  Lab 11/06/18 2236 11/07/18 0946  WBC 4.3 3.2*  NEUTROABS 3.6 2.5  HGB 10.4* 9.0*  HCT 32.2* 28.0*  MCV 63.8* 64.2*  PLT 190 152   Cardiac Enzymes: No results for input(s): CKTOTAL, CKMB, CKMBINDEX, TROPONINI in the last 168 hours. BNP: BNP (last 3 results) No results for input(s): BNP in the last 8760 hours.  ProBNP (last 3 results) No results for input(s): PROBNP in the last 8760 hours.  CBG: No results for input(s): GLUCAP in the last 168 hours.  Principal Problem:   Intractable abdominal  pain Active Problems:   Pancreatic adenocarcinoma (HCC)   Chronic prescription opiate use   Microcytic anemia   Time coordinating discharge: 38 minutes  Signed:        Yassine Brunsman, DO [facility]  11/08/2018, 6:07 PM

## 2018-11-11 ENCOUNTER — Ambulatory Visit: Payer: Medicaid Other

## 2018-11-12 ENCOUNTER — Ambulatory Visit
Admission: RE | Admit: 2018-11-12 | Discharge: 2018-11-12 | Disposition: A | Discharge status: Home / Self Care | Payer: Medicaid Other | Source: Ambulatory Visit | Attending: Radiation Oncology | Admitting: Radiation Oncology

## 2018-11-12 ENCOUNTER — Other Ambulatory Visit: Payer: Self-pay | Admitting: Nurse Practitioner

## 2018-11-12 ENCOUNTER — Other Ambulatory Visit: Payer: Self-pay

## 2018-11-12 ENCOUNTER — Telehealth: Payer: Self-pay

## 2018-11-12 DIAGNOSIS — C259 Malignant neoplasm of pancreas, unspecified: Secondary | ICD-10-CM

## 2018-11-12 DIAGNOSIS — C250 Malignant neoplasm of head of pancreas: Secondary | ICD-10-CM

## 2018-11-12 DIAGNOSIS — Z510 Encounter for antineoplastic radiation therapy: Secondary | ICD-10-CM | POA: Diagnosis not present

## 2018-11-12 MED ORDER — MORPHINE SULFATE ER 15 MG PO TBCR: 15.0000 mg | EXTENDED_RELEASE_TABLET | Freq: Two times a day (BID) | ORAL | 0 refills | Status: DC

## 2018-11-12 MED ORDER — MORPHINE SULFATE 15 MG PO TABS: ORAL_TABLET | ORAL | 0 refills | Status: DC

## 2018-11-12 MED FILL — MORPHINE SULF ER 15 MG TAB: 15 | 30 days supply | Qty: 60 | Fill #0

## 2018-11-12 MED FILL — MORPHINE SULFATE IR 15 MG T: 15 | 8 days supply | Qty: 45 | Fill #0

## 2018-11-12 NOTE — Telephone Encounter (Signed)
Patient presented at [facility] desk requesting refill on her MS Contin 15 mg q 12 hours and MSIR 15 mg be refilled today and sent into [facility]. I reminded her she has appointments tomorrow for IV fluids and to see Cira Rue NP and that she needs to keep those appointments.  She states she will.  Notified Cira Rue NP.

## 2018-11-12 NOTE — Progress Notes (Signed)
Jane Brooks   [phone] Fax:(336) [phone]   Clinic Follow up Note   Patient Care Team: Patient, No Pcp Per as PCP - General (General Practice) Milus Banister, MD as Attending Physician (Gastroenterology) Truitt Merle, MD as Consulting Physician (Hematology) Date of Service: 11/13/2018  CHIEF COMPLAINT: F/u pancreas cancer   SUMMARY OF ONCOLOGIC HISTORY: Oncology History Overview Note  Cancer Staging Pancreatic cancer [facility]) Staging form: Exocrine Pancreas, AJCC 8th Edition - Clinical stage from 11/08/2017: Stage IB (cT2, cN0, cM0) - Signed by Truitt Merle, MD on 11/09/2017     Malignant neoplasm of head of pancreas ([place])  10/30/2017 Imaging   MRI Abdomen 10/30/17  IMPRESSION: 1. Marked intra and extrahepatic biliary duct dilatation with abrupt cut off of the common bile duct at the pancreatic head. This is associated with marked dilatation of the main pancreatic duct also with an abrupt cut off at the level of the pancreatic head. Pancreatic parenchyma in the body and tail of pancreas is markedly atrophic. A subtle 2.5 x 3 cm lesion with differential signal intensity is identified in the head of the pancreas which appears slightly hypoenhancing on postcontrast imaging. Together, these features are highly concerning for pancreatic adenocarcinoma. EUS is recommended to further evaluate. 2. Portal vein is patent although there is some mass-effect on the portal splenic confluence and IVC. Fat planes around celiac axis and SMA appear preserved. 3. Upper normal to borderline portal caval lymph node associated with small right para-aortic lymphadenopathy.     10/30/2017 Tumor Marker   Baseline Ca 19-9 at 140   10/31/2017 Procedure   ERCP by Dr. Lyndel Safe 10/31/17  IMPRESSION Malignant appearing CBD stricture (due to pancreatic mass) status post sphincterotomy and stent insertion.   11/08/2017 Cancer Staging   Staging form: Exocrine Pancreas, AJCC  8th Edition - Clinical stage from 11/08/2017: Stage IB (cT2, cN0, cM0) - Signed by Truitt Merle, MD on 11/09/2017   11/08/2017 Procedure   EUS and EGD by Dr. Ardis Hughs 11/08/17  IMPRESSION -Very vague, irregularly bordered, approximately 2.6cm mass was found in the head of pancreas. This causes biliary and pancreatic duct obstruction. The previously plastic biliary stent is in good position. The mass directly abuts and attenuates the PV/SMV confluence, suggesting invasion. FNA performed and the preliminary cytology review is positive for maligancy (adenocarcinoma). - [place] [facility] office will arrange referrals to medical and surgical oncology. - Case to be discussed at upcoming multidisciplinary GI tumor conference.   11/08/2017 Initial Biopsy   Biopsy Cytology 11/08/17 Diagnosis FINE NEEDLE ASPIRATION, ENDOSCOPIC, PANCREAS HEAD (SPECIMEN 1 OF 1 COLLECTED 11/08/17): MALIGNANT CELLS CONSISTENT WITH ADENOCARCINOMA.   11/09/2017 Initial Diagnosis   Pancreatic cancer ([place])   11/15/2017 Imaging   CT Chest and Pelvis WO Contrast 11/15/17 IMPRESSION: 1. No findings of active malignancy in the chest or pelvis. 2. Aortic Atherosclerosis (ICD10-I70.0). Coronary atherosclerosis. 3. Faint centrilobular nodularity in the lung apices, raising the possibility of hypersensitivity pneumonitis or less likely respiratory bronchiolitis. Airway thickening is present, suggesting bronchitis or reactive airways disease.   11/19/2017 -  Chemotherapy   FOLFIRINOX q2weeks with Neulasta injection for 4 months starting 11/19/17. Completed 8 cycles on 03/07/18    02/04/2018 Imaging   CT AP W Contrast 02/04/18  IMPRESSION: 1. No substantial change in size of mass involving head of pancreas. A aortocaval lymph node which appears increased in the interval measuring 1.1 cm versus 0.8 cm previously. Stable 1.2 cm peripancreatic node. 2. Dilated proximal common bile duct is mildly increased  in caliber compared with  12/12/2017. The common bile duct stent remains in place in appears to be in appropriate position.    04/19/2018 Imaging   CT AP 04/19/18  IMPRESSION: 1. Essentially stable exam compared to most recent CT scans of 02/04/2018 and 12/12/2017. 2. Stable mass in the head of the pancreas with duct dilatation and atrophy upstream. Mass continues to surround the GDA and contacts the portal vein but is free of the celiac trunk and SMA. Mass probably contacts the RIGHT hepatic artery at the takeoff of the GDA (image 56/5). Dominant LEFT hepatic artery originates from the LEFT gastric. 3. Mild intrahepatic and extrahepatic duct dilatation similar to prior with flexible stent in the common bile duct. 4. No new or progressive disease. 5. No peritoneal metastasis.   04/25/2018 Imaging   CT Angio chest 04/25/18  IMPRESSION: 1. No demonstrable pulmonary embolus. No thoracic aortic aneurysm or dissection. 2. Areas of atelectatic change bilaterally. No consolidation. No pneumothorax. Endotracheal tube tip is slightly superior to the carina. 3. Enlarged subcarinal lymph node, concerning for neoplastic etiology. 4. 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 Biliary stent present. Biliary duct air is felt to be secondary to the stent placement. 5. Mild upper abdominal ascites. Pneumoperitoneum with apparent drainage catheter present. The pneumoperitoneum is likely of post procedural/postoperative etiology. Bowel perforation in this circumstance cannot be excluded entirely, however. Close clinical assessment in this regard advised. 6.  Left ventricular hypertrophy.   04/25/2018 Surgery   LAPAROSCOPY DIAGNOSTIC and WHIPPLE PROCEDURE, RECONSTRUCTION OF PORTAL VEIN by Dr. Barry Dienes, Dr. Marcello Moores 04/25/18    04/25/2018 Pathology Results    Diagnosis 04/25/18 1. Whipple procedure/resection, with gallbladder with segment of portal vein - INVASIVE ADENOCARCINOMA, MODERATELY DIFFERENTIATED, SPANNING  3.5 CM. - ADENOCARCINOMA IS PRESENT AT THE SUPERIOR MARGIN OF THE ATTACHED VEIN AND THE INFERIOR MARGIN OF THE ATTACHED VEIN (BLOCKS E AND F). - ADENOCARCINOMA EXTENDS INTO PERIPANCREATIC SOFT TISSUE. - PERINEURAL INVASION IS IDENTIFIED, DIFFUSE. - METASTATIC ADENOCARCINOMA IN 1 OF 24 LYMPH NODES (1/24). - SEE ONCOLOGY TABLE BELOW. 2. Lymph node, biopsy, portal - THERE IS NO EVIDENCE OF CARCINOMA IN 5 OF 5 LYMPH NODES (0/5). 3. Pancreas, biopsy, additional pancreatic margin - HIGH GRADE GLANDULAR DYSPLASIA, FOCAL    05/09/2018 Imaging   CT AP W Contrast 05/09/18 IMPRESSION: Status post Whipple's procedure. 1 surgical drain remains in the area. Pancreatic duct stent is noted.   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 It appears to be stable in size compared to prior exam.   Wall and fold thickening is seen involving right and transverse colon which may represent edema or possibly inflammation.   Aortic Atherosclerosis (ICD10-I70.0).   06/15/2018 Imaging   CT AP W Contrast 06/15/18 IMPRESSION: Changes of prior Whipple procedure. Previously seen pancreatic duct stent presumably has migrated across the choledocho-jejunostomy and is now located in the biliary ducts. No biliary or pancreatic ductal dilatation.   Severe diffuse low-density throughout the liver, likely fatty infiltration.   Wall thickening within the ascending colon and hepatic flexure concerning for colitis.   Small amount of free fluid in the pelvis.   Aortic atherosclerosis.   07/25/2018 PET scan   PET 07/25/18  IMPRESSION: 1. Right hepatic lobe hypermetabolism, in the setting of heterogeneous hepatic steatosis. Finding is highly suspicious for isolated hepatic metastasis. 2. More equivocal lower level hypermetabolism along the medial hepatic  capsule and within the hepatic dome. These areas could  be postoperative and physiologic respectively. Recommend attention on follow-up. 3. Coronary artery atherosclerosis. Aortic Atherosclerosis (ICD10-I70.0).   08/13/2018 Pathology Results   Diagnosis 08/13/18 Liver, needle/core biopsy, right - LIVER PARENCHYMA WITH A BILE EXTRAVASATION AND HISTIOCYTES - SEE COMMENT   08/21/2018 Imaging   CT AP W Contrast  IMPRESSION: 1. Diffuse colonic wall thickening and submucosal edema suggestive of colitis, findings are similar to prior study. 2. Interval improvement in the degree of gastric wall thickening and edema compared to prior CT. 3. Small volume ascites, unchanged. 4. Diffuse hepatic steatosis with grossly stable area of non masslike enhancement in the right hepatic lobe. Continued imaging surveillance of this finding is suggested.     10/09/2018 -  Radiation Therapy   Adjuvant radiation with Dr. Lisbeth Renshaw 10/09/18-11/18/18     CURRENT THERAPY: Radiation  INTERVAL HISTORY: Jane Brooks returns for f/u and IVF as scheduled, she was seen in the infusion room. She has been hospitalized frequently for pain management and colitis. She continues radiation therapy. She notes before starting RT, she was gaining weight and pain was minimal and well controlled. She tried to wean off pain meds. She delayed starting RT treatment due to fear of side effects. Immediately after starting RT, she developed abdominal pain and n/v. She "eats zofran like its candy" but continues to vomit after meals periodically. Meds make her gag. Dissolvable zofran is effective most of the time. For last 3 days she has been in bed, feeling like "someone punched me in the gut." Pain is in epigastric area, also feels spasms occasionally. Pain is "good" today, she felt better after vomiting this morning. Takes MS contin BID and MSIR q4 hours mostly but today has gone longer. She tries to take as prescribed. Also smoking marijuana when she can, which helps pain, appetite, and general  mood. Has formed BMs usually once daily. Takes suppositories if needed. Tries to eat at least once daily, but drinking well. She gets full after 4-5 bites despite good appetite for most food. In general she feels weak, can not stand up straight without having abdominal pain, core is weak. She is tired, frustrated, tearful because of her physical appearance. Doesn't want to leave the house or interact socially because she feels like she has to defend way she looks, especially her low weight. She was not anticipating this much struggle after surgery. She is depressed, denies SI. Wants to return to work eventually, does not like being on fixed income.     MEDICAL HISTORY:  Past Medical History:  Diagnosis Date   Family history of lung cancer    Family history of prostate cancer    Family history of uterine cancer    Gallstones 10/2017   Pancreatic cancer ([place])    Pneumothorax, closed, traumatic    years ago   PONV (postoperative nausea and vomiting)     SURGICAL HISTORY: Past Surgical History:  Procedure Laterality Date   BILIARY STENT PLACEMENT  10/31/2017   Procedure: BILIARY STENT PLACEMENT;  Surgeon: [name], MD;  Location: [place];  Service: Gastroenterology;;   BIOPSY  10/23/2018   Procedure: BIOPSY;  Surgeon: [name], MD;  Location: [facility];  Service: Endoscopy;;   COLONOSCOPY WITH PROPOFOL N/A 10/23/2018   Procedure: COLONOSCOPY WITH PROPOFOL;  Surgeon: [name], MD;  Location: [facility];  Service: Endoscopy;  Laterality: N/A;   ERCP N/A 10/31/2017   Procedure: ENDOSCOPIC RETROGRADE CHOLANGIOPANCREATOGRAPHY (ERCP);  Surgeon: [name],  MD;  Location: [place];  Service: Gastroenterology;  Laterality: N/A;   ESOPHAGOGASTRODUODENOSCOPY N/A 11/08/2017   Procedure: ESOPHAGOGASTRODUODENOSCOPY (EGD);  Surgeon: Milus Banister, MD;  Location: [facility];  Service: Endoscopy;  Laterality: N/A;   EUS N/A 11/08/2017   Procedure: UPPER  ENDOSCOPIC ULTRASOUND (EUS) RADIAL;  Surgeon: Milus Banister, MD;  Location: [facility];  Service: Endoscopy;  Laterality: N/A;   FINE NEEDLE ASPIRATION  11/08/2017   Procedure: FINE NEEDLE ASPIRATION;  Surgeon: Milus Banister, MD;  Location: [facility];  Service: Endoscopy;;   IR IMAGING GUIDED PORT INSERTION  11/15/2017   LAPAROSCOPY N/A 04/25/2018   Procedure: LAPAROSCOPY DIAGNOSTIC;  Surgeon: Stark Klein, MD;  Location: [place];  Service: General;  Laterality: N/A;  GENERAL AND EPIDURAL ANESTHESIA   SPHINCTEROTOMY  10/31/2017   Procedure: SPHINCTEROTOMY;  Surgeon: [name], MD;  Location: [place];  Service: Gastroenterology;;   TONSILLECTOMY     WHIPPLE PROCEDURE N/A 04/25/2018   Procedure: WHIPPLE PROCEDURE, RECONSTRUCTION OF PORTAL VEIN;  Surgeon: Stark Klein, MD;  Location: [place];  Service: General;  Laterality: N/A;    I have reviewed the social history and family history with the patient and they are unchanged from previous note.  ALLERGIES:  is allergic to latex.  MEDICATIONS:  Current Outpatient Medications  Medication Sig Dispense Refill   acetaminophen (TYLENOL) 325 MG tablet Take 2 tablets (650 mg total) by mouth every 6 (six) hours as needed for mild pain (or Fever >/= 101). 30 tablet 0   cyclobenzaprine (FEXMID) 7.5 MG tablet Take 1 tablet (7.5 mg total) by mouth 3 (three) times daily as needed for muscle spasms. 30 tablet 0   dicyclomine (BENTYL) 10 MG capsule Take 1 capsule (10 mg total) by mouth 3 (three) times daily between meals as needed for spasms. 30 capsule 0   diphenoxylate-atropine (LOMOTIL) 2.5-0.025 MG tablet Take 1 to 2 tablets PO QID prn diarrhea (Patient not taking: Reported on 10/22/2018) 40 tablet 0   dronabinol (MARINOL) 2.5 MG capsule Take 1 capsule (2.5 mg total) by mouth 2 (two) times daily before lunch and supper. 60 capsule 0   gabapentin (NEURONTIN) 300 MG capsule Take 1 capsule (300 mg total) by mouth 3 (three) times daily.  90 capsule 1   lipase/protease/amylase (CREON) 36000 UNITS CPEP capsule Take 36,000 Units by mouth 3 (three) times daily before meals.     morphine (MS CONTIN) 15 MG 12 hr tablet Take 1 tablet (15 mg total) by mouth every 12 (twelve) hours. 60 tablet 0   morphine (MSIR) 15 MG tablet Take 1 tablet every 4 to 6 hours as needed for moderate to severe pain 45 tablet 0   nicotine (NICODERM CQ - DOSED IN MG/24 HR) 7 mg/24hr patch Place 1 patch (7 mg total) onto the skin daily. 28 patch 0   ondansetron (ZOFRAN ODT) 8 MG disintegrating tablet Take 1 tablet (8 mg total) by mouth every 8 (eight) hours as needed for nausea or vomiting. 20 tablet 1   pantoprazole (PROTONIX) 40 MG tablet Take 1 tablet (40 mg total) by mouth daily. (Patient not taking: Reported on 11/06/2018) 60 tablet 3   polyethylene glycol (MIRALAX / GLYCOLAX) 17 g packet Take 17 g by mouth daily. 14 each 0   potassium chloride SA (KLOR-CON) 20 MEQ tablet Take 20 mEq by mouth daily.     promethazine (PHENERGAN) 25 MG suppository Place 1 suppository (25 mg total) rectally every 6 (six) hours as needed for nausea or vomiting.  Do not take within 6 hours of oral phenergan tablet 12 each 1   promethazine (PHENERGAN) 25 MG tablet Take 1 tablet (25 mg total) by mouth every 6 (six) hours as needed for nausea or vomiting. (Patient not taking: Reported on 11/06/2018) 30 tablet 0   scopolamine (TRANSDERM-SCOP) 1 MG/3DAYS Place 1 patch (1.5 mg total) onto the skin every 3 (three) days. 10 patch 3   No current facility-administered medications for this visit.     PHYSICAL EXAMINATION: ECOG PERFORMANCE STATUS: 2 - Symptomatic, <50% confined to bed See infusion flow sheet for today's vitals  GENERAL: thin appearing female in no distress, comfortable SKIN: no rash  EYES:  sclera clear LYMPH:  no palpable cervical or supraclavicular lymphadenopathy LUNGS: faint rales in bases, normal breathing effort HEART: regular rate & rhythm, no lower  extremity edema ABDOMEN: abdomen soft with normal bowel sounds. Moderate epigastric tenderness  NEURO: alert & oriented x 3 with fluent speech, normal gait PAC without erythema   LABORATORY DATA:  I have reviewed the data as listed CBC Latest Ref Rng & Units 11/13/2018 11/07/2018 11/06/2018  WBC 4.0 - 10.5 K/uL 6.9 3.2(L) 4.3  Hemoglobin 12.0 - 15.0 g/dL 10.5(L) 9.0(L) 10.4(L)  Hematocrit 36.0 - 46.0 % 32.2(L) 28.0(L) 32.2(L)  Platelets 150 - 400 K/uL 214 152 190     CMP Latest Ref Rng & Units 11/13/2018 11/07/2018 11/06/2018  Glucose 70 - 99 mg/dL 100(H) 74 105(H)  BUN 6 - 20 mg/dL 7 10 9   Creatinine 0.44 - 1.00 mg/dL 0.62 0.51 0.51  Sodium 135 - 145 mmol/L 139 136 136  Potassium 3.5 - 5.1 mmol/L 4.0 3.6 3.8  Chloride 98 - 111 mmol/L 102 102 103  CO2 22 - 32 mmol/L 26 27 25   Calcium 8.9 - 10.3 mg/dL 8.6(L) 8.3(L) 8.8(L)  Total Protein 6.5 - 8.1 g/dL 6.5 5.4(L) 6.5  Total Bilirubin 0.3 - 1.2 mg/dL 0.6 0.5 0.7  Alkaline Phos 38 - 126 U/L 103 90 98  AST 15 - 41 U/L 37 42(H) 27  ALT 0 - 44 U/L 27 27 25       RADIOGRAPHIC STUDIES: I have personally reviewed the radiological images as listed and agreed with the findings in the report. No results found.   ASSESSMENT & PLAN: Jane Brooks is a 48 y.o. female with   1. 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 -Her postoprecovery has been difficult withrecurrentabdominal pain, nausea, vomiting and diarrhea, multiple ED visits and hospitalization -HerPET from 07/25/18 showedright hepatic lobe hypermetabolic activity, which is suspicious for isolated liver metastasis. There is also mild uptake of LN alongmedial hepatic capsule and within the hepatic dome,  no other evidence of mets. -We discussed her liver biopsy from 08/13/18 which showsno evidence of cancer cells.  - IRDr. Chestine Spore they got it and did not recommend a rebiopsy. - CT on 08/21/2018 done during her ED visit showed no clear evidence of metastasis -Due to her positive surgical margins, she was referred for adjuvant radiation per Dr. Lisbeth Renshaw, started 9/23 -she had increased abdominal pain, n/v after starting RT and required hospital admission for management  -she has canceled supportive care and f/u appointments with [place]  -she will finish radiation next week, I plan to do phone visit in 2 weeks and  in-person f/u in 4 weeks, or sooner if needed   2.Recurrent abdominal pain, with N&V -remains uncontrolled, s/p recent hospitalization for pain management -reviewed symptom management, sublingual zofran most effective but still lacking adquate control -will try scopolamine patch, phenergan suppository PRN and before meals   3. Social and Acupuncturist -followed by SW  4. 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 Genetics -She was seen by the Dietitian.Genetic testhasbeen held due to lack of insurance.  6. Smoking Cessation  -still smoking, wants to quit -I prescribed nicotine patch for her today  7. Low appetite, Low weight  -marinol did not help, she is smoking marijuana  -stable weight    Disposition:  Jane Brooks appears stable, IV fluids infusing during today's visit. Labs reviwed, mild anemia is stable; CMP unremarkable. She continues to deal with nutrition issues related to post-prandial vomiting and epigastric pain exacerbations. She reportedly takes pain meds as prescribed and is well controlled today. Her pain medication was recently refilled, current regimen appears adequate. I will have my nurse call her before each refill to review  her pain level and efficacy of her regimen. I will continue to fill pain meds with the goal to wean as soon as possible, but I don't plan to fill if she cancels her scheduled follow ups. Patient agrees.   For persistent post prandial nausea and vomiting, I recommend to add scopolamine patch and phenergan suppository before meals to better control nausea in hopes that she will be able to improve nutrition and gain weight. Will add bentyl PRN for intestinal spasm.   She is depressed about her appearance, low weight, and overall deconditioning and weak core. She declined SW intervention today. I am referring her to PT for strengthening, she agrees. Plan to start after she completes radiation.   She is refusing future IV fluid appointments, but agrees to return next week for last scheduled IVF during final week of RT. OK to run fluids over 1 hour. Will schedule future supportive care PRN if she agrees.   We will f/u with phone visit in 2 weeks, she will return for lab and f/u in person in 4 weeks. I strongly encouraged her to call if she has new or worsening concerns, she agrees. I prescribed nicotine patch to help her quit smoking.    No problem-specific Assessment & Plan notes found for this encounter.   Orders Placed This Encounter  Procedures   Ambulatory referral to Physical Therapy    Referral Priority:   Routine    Referral Type:   Physical Medicine    Referral Reason:   Specialty Services Required    Requested Specialty:   Physical Therapy    Number of Visits Requested:   1   All questions were answered. The patient knows to call the clinic with any problems, questions or concerns. No barriers to learning was detected. I spent 30 minutes counseling the patient face to face. The total time spent in the appointment was 40 minutes and more than 50% was on counseling and review of test results     Alla Feeling, NP 11/16/18

## 2018-11-13 ENCOUNTER — Ambulatory Visit: Payer: Medicaid Other

## 2018-11-13 ENCOUNTER — Inpatient Hospital Stay: Payer: Medicaid Other

## 2018-11-13 ENCOUNTER — Ambulatory Visit
Admission: RE | Admit: 2018-11-13 | Discharge: 2018-11-13 | Disposition: A | Discharge status: Home / Self Care | Payer: Medicaid Other | Source: Ambulatory Visit | Attending: Radiation Oncology | Admitting: Radiation Oncology

## 2018-11-13 ENCOUNTER — Inpatient Hospital Stay (HOSPITAL_BASED_OUTPATIENT_CLINIC_OR_DEPARTMENT_OTHER): Payer: Medicaid Other | Admitting: Nurse Practitioner

## 2018-11-13 ENCOUNTER — Other Ambulatory Visit: Payer: Self-pay

## 2018-11-13 VITALS — BP 107/80 | HR 79 | Temp 98.7°F | Resp 18

## 2018-11-13 DIAGNOSIS — F17210 Nicotine dependence, cigarettes, uncomplicated: Secondary | ICD-10-CM | POA: Diagnosis not present

## 2018-11-13 DIAGNOSIS — Z8042 Family history of malignant neoplasm of prostate: Secondary | ICD-10-CM | POA: Diagnosis not present

## 2018-11-13 DIAGNOSIS — I700 Atherosclerosis of aorta: Secondary | ICD-10-CM | POA: Diagnosis not present

## 2018-11-13 DIAGNOSIS — I2510 Atherosclerotic heart disease of native coronary artery without angina pectoris: Secondary | ICD-10-CM | POA: Diagnosis not present

## 2018-11-13 DIAGNOSIS — D6481 Anemia due to antineoplastic chemotherapy: Secondary | ICD-10-CM | POA: Diagnosis not present

## 2018-11-13 DIAGNOSIS — C259 Malignant neoplasm of pancreas, unspecified: Secondary | ICD-10-CM | POA: Diagnosis not present

## 2018-11-13 DIAGNOSIS — R630 Anorexia: Secondary | ICD-10-CM | POA: Diagnosis not present

## 2018-11-13 DIAGNOSIS — T451X5A Adverse effect of antineoplastic and immunosuppressive drugs, initial encounter: Secondary | ICD-10-CM | POA: Diagnosis not present

## 2018-11-13 DIAGNOSIS — Z79899 Other long term (current) drug therapy: Secondary | ICD-10-CM | POA: Diagnosis not present

## 2018-11-13 DIAGNOSIS — K760 Fatty (change of) liver, not elsewhere classified: Secondary | ICD-10-CM | POA: Diagnosis not present

## 2018-11-13 DIAGNOSIS — C250 Malignant neoplasm of head of pancreas: Secondary | ICD-10-CM

## 2018-11-13 DIAGNOSIS — R634 Abnormal weight loss: Secondary | ICD-10-CM | POA: Diagnosis not present

## 2018-11-13 DIAGNOSIS — K529 Noninfective gastroenteritis and colitis, unspecified: Secondary | ICD-10-CM | POA: Diagnosis not present

## 2018-11-13 DIAGNOSIS — C257 Malignant neoplasm of other parts of pancreas: Secondary | ICD-10-CM | POA: Diagnosis not present

## 2018-11-13 DIAGNOSIS — R591 Generalized enlarged lymph nodes: Secondary | ICD-10-CM | POA: Diagnosis not present

## 2018-11-13 DIAGNOSIS — F329 Major depressive disorder, single episode, unspecified: Secondary | ICD-10-CM | POA: Diagnosis not present

## 2018-11-13 DIAGNOSIS — R110 Nausea: Secondary | ICD-10-CM

## 2018-11-13 DIAGNOSIS — Z9221 Personal history of antineoplastic chemotherapy: Secondary | ICD-10-CM | POA: Diagnosis not present

## 2018-11-13 DIAGNOSIS — D563 Thalassemia minor: Secondary | ICD-10-CM | POA: Diagnosis not present

## 2018-11-13 DIAGNOSIS — Z510 Encounter for antineoplastic radiation therapy: Secondary | ICD-10-CM | POA: Diagnosis not present

## 2018-11-13 DIAGNOSIS — Z801 Family history of malignant neoplasm of trachea, bronchus and lung: Secondary | ICD-10-CM | POA: Diagnosis not present

## 2018-11-13 LAB — CMP (CANCER CENTER ONLY)
ALT: 27 U/L (ref 0–44)
AST: 37 U/L (ref 15–41)
Albumin: 3.4 g/dL — ABNORMAL LOW (ref 3.5–5.0)
Alkaline Phosphatase: 103 U/L (ref 38–126)
Anion gap: 11 (ref 5–15)
BUN: 7 mg/dL (ref 6–20)
CO2: 26 mmol/L (ref 22–32)
Calcium: 8.6 mg/dL — ABNORMAL LOW (ref 8.9–10.3)
Chloride: 102 mmol/L (ref 98–111)
Creatinine: 0.62 mg/dL (ref 0.44–1.00)
GFR, Est AFR Am: >60 mL/min (ref >60)
GFR, Estimated: >60 mL/min (ref >60)
Glucose, Bld: 100 mg/dL — ABNORMAL HIGH (ref 70–99)
Potassium: 4 mmol/L (ref 3.5–5.1)
Sodium: 139 mmol/L (ref 135–145)
Total Bilirubin: 0.6 mg/dL (ref 0.3–1.2)
Total Protein: 6.5 g/dL (ref 6.5–8.1)

## 2018-11-13 LAB — CBC WITH DIFFERENTIAL (CANCER CENTER ONLY)
Abs Immature Granulocytes: 0.06 10*3/uL (ref 0.00–0.07)
Basophils Absolute: 0 10*3/uL (ref 0.0–0.1)
Basophils Relative: 0 %
Eosinophils Absolute: 0.2 10*3/uL (ref 0.0–0.5)
Eosinophils Relative: 3 %
HCT: 32.2 % — ABNORMAL LOW (ref 36.0–46.0)
Hemoglobin: 10.5 g/dL — ABNORMAL LOW (ref 12.0–15.0)
Immature Granulocytes: 1 %
Lymphocytes Relative: 3 %
Lymphs Abs: 0.2 10*3/uL — ABNORMAL LOW (ref 0.7–4.0)
MCH: 20.4 pg — ABNORMAL LOW (ref 26.0–34.0)
MCHC: 32.6 g/dL (ref 30.0–36.0)
MCV: 62.6 fL — ABNORMAL LOW (ref 80.0–100.0)
Monocytes Absolute: 0.5 10*3/uL (ref 0.1–1.0)
Monocytes Relative: 8 %
Neutro Abs: 5.9 10*3/uL (ref 1.7–7.7)
Neutrophils Relative %: 85 %
Platelet Count: 214 10*3/uL (ref 150–400)
RBC: 5.14 MIL/uL — ABNORMAL HIGH (ref 3.87–5.11)
RDW: 19 % — ABNORMAL HIGH (ref 11.5–15.5)
WBC Count: 6.9 10*3/uL (ref 4.0–10.5)
nRBC: 0.4 % — ABNORMAL HIGH (ref 0.0–0.2)

## 2018-11-13 MED ORDER — PROMETHAZINE HCL 25 MG RE SUPP: 25.0000 mg | Freq: Four times a day (QID) | RECTAL | 1 refills | Status: DC | PRN

## 2018-11-13 MED ORDER — DEXAMETHASONE SODIUM PHOSPHATE 10 MG/ML IJ SOLN
INTRAMUSCULAR | Status: AC
  Filled 2018-11-13: qty 1

## 2018-11-13 MED ORDER — ONDANSETRON HCL 4 MG/2ML IJ SOLN
INTRAMUSCULAR | Status: AC
  Filled 2018-11-13: qty 4

## 2018-11-13 MED ORDER — NICOTINE 7 MG/24HR TD PT24: 7.0000 mg | MEDICATED_PATCH | Freq: Every day | TRANSDERMAL | 0 refills | Status: AC

## 2018-11-13 MED ORDER — SODIUM CHLORIDE 0.9 % IV SOLN: Freq: Once | INTRAVENOUS | Status: DC

## 2018-11-13 MED ORDER — SODIUM CHLORIDE 0.9% FLUSH
10.0000 mL | Freq: Once | INTRAVENOUS | Status: AC | PRN
  Administered 2018-11-13: 10 mL
  Filled 2018-11-13: qty 10

## 2018-11-13 MED ORDER — DICYCLOMINE HCL 10 MG PO CAPS: 10.0000 mg | ORAL_CAPSULE | Freq: Three times a day (TID) | ORAL | 0 refills | Status: DC | PRN

## 2018-11-13 MED ORDER — DEXAMETHASONE SODIUM PHOSPHATE 10 MG/ML IJ SOLN
8.0000 mg | Freq: Once | INTRAMUSCULAR | Status: AC
  Administered 2018-11-13: 10:00:00 8 mg via INTRAVENOUS

## 2018-11-13 MED ORDER — SCOPOLAMINE 1 MG/3DAYS TD PT72: 1.0000 | MEDICATED_PATCH | TRANSDERMAL | 3 refills | Status: DC

## 2018-11-13 MED ORDER — HEPARIN SOD (PORK) LOCK FLUSH 100 UNIT/ML IV SOLN
500.0000 [IU] | Freq: Once | INTRAVENOUS | Status: DC | PRN
  Filled 2018-11-13: qty 5

## 2018-11-13 MED ORDER — ONDANSETRON HCL 4 MG/2ML IJ SOLN
8.0000 mg | Freq: Once | INTRAMUSCULAR | Status: AC
  Administered 2018-11-13: 10:00:00 8 mg via INTRAVENOUS

## 2018-11-13 MED ORDER — SODIUM CHLORIDE 0.9 % IV SOLN
Freq: Once | INTRAVENOUS | Status: AC
  Administered 2018-11-13: 10:00:00 via INTRAVENOUS
  Filled 2018-11-13: qty 250

## 2018-11-13 MED FILL — NICOTINE 7 MG/24HR PATCH: 7 | 28 days supply | Qty: 28 | Fill #0

## 2018-11-13 MED FILL — DICYCLOMINE 10 MG CAPSULE: 10 | 10 days supply | Qty: 30 | Fill #0

## 2018-11-13 MED FILL — PROMETHAZINE HCL 25 MG SUPP: 25 | 3 days supply | Qty: 12 | Fill #0

## 2018-11-13 MED FILL — TRANSDERM-SCOP 1.5 MG/72HR: 1 | 30 days supply | Qty: 10 | Fill #0

## 2018-11-13 NOTE — Patient Instructions (Signed)

## 2018-11-13 NOTE — Progress Notes (Signed)
Per NP, OK to send pt to appointment with IV fluids infusing. Pt escorted to med-onc for appointment.

## 2018-11-14 ENCOUNTER — Other Ambulatory Visit: Payer: Self-pay

## 2018-11-14 ENCOUNTER — Ambulatory Visit: Payer: Medicaid Other

## 2018-11-14 ENCOUNTER — Ambulatory Visit
Admission: RE | Admit: 2018-11-14 | Discharge: 2018-11-14 | Disposition: A | Discharge status: Home / Self Care | Payer: Medicaid Other | Source: Ambulatory Visit | Attending: Radiation Oncology | Admitting: Radiation Oncology

## 2018-11-14 DIAGNOSIS — Z510 Encounter for antineoplastic radiation therapy: Secondary | ICD-10-CM | POA: Diagnosis not present

## 2018-11-14 LAB — CANCER ANTIGEN 19-9: CA 19-9: 123 U/mL — ABNORMAL HIGH (ref 0–35)

## 2018-11-15 ENCOUNTER — Ambulatory Visit: Payer: Medicaid Other

## 2018-11-15 ENCOUNTER — Other Ambulatory Visit: Payer: Self-pay

## 2018-11-15 ENCOUNTER — Ambulatory Visit
Admission: RE | Admit: 2018-11-15 | Discharge: 2018-11-15 | Disposition: A | Discharge status: Home / Self Care | Payer: Medicaid Other | Source: Ambulatory Visit | Attending: Radiation Oncology | Admitting: Radiation Oncology

## 2018-11-15 DIAGNOSIS — Z510 Encounter for antineoplastic radiation therapy: Secondary | ICD-10-CM | POA: Diagnosis not present

## 2018-11-16 ENCOUNTER — Ambulatory Visit: Payer: Medicaid Other

## 2018-11-16 ENCOUNTER — Encounter: Payer: Self-pay | Admitting: Nurse Practitioner

## 2018-11-18 ENCOUNTER — Ambulatory Visit: Payer: Medicaid Other

## 2018-11-18 ENCOUNTER — Ambulatory Visit: Payer: Medicaid Other | Admitting: Internal Medicine

## 2018-11-18 ENCOUNTER — Ambulatory Visit
Admission: RE | Admit: 2018-11-18 | Discharge: 2018-11-18 | Disposition: A | Discharge status: Home / Self Care | Payer: Medicaid Other | Source: Ambulatory Visit | Attending: Radiation Oncology | Admitting: Radiation Oncology

## 2018-11-18 ENCOUNTER — Other Ambulatory Visit: Payer: Self-pay

## 2018-11-18 DIAGNOSIS — C250 Malignant neoplasm of head of pancreas: Secondary | ICD-10-CM | POA: Insufficient documentation

## 2018-11-18 DIAGNOSIS — Z510 Encounter for antineoplastic radiation therapy: Secondary | ICD-10-CM | POA: Insufficient documentation

## 2018-11-19 ENCOUNTER — Ambulatory Visit
Admission: RE | Admit: 2018-11-19 | Discharge: 2018-11-19 | Disposition: A | Discharge status: Home / Self Care | Payer: Medicaid Other | Source: Ambulatory Visit | Attending: Radiation Oncology | Admitting: Radiation Oncology

## 2018-11-19 ENCOUNTER — Ambulatory Visit: Payer: Medicaid Other

## 2018-11-19 ENCOUNTER — Other Ambulatory Visit: Payer: Self-pay

## 2018-11-19 DIAGNOSIS — Z510 Encounter for antineoplastic radiation therapy: Secondary | ICD-10-CM | POA: Diagnosis not present

## 2018-11-20 ENCOUNTER — Inpatient Hospital Stay: Payer: Medicaid Other

## 2018-11-20 ENCOUNTER — Other Ambulatory Visit: Payer: Self-pay

## 2018-11-20 ENCOUNTER — Telehealth: Payer: Self-pay | Admitting: Emergency Medicine

## 2018-11-20 ENCOUNTER — Inpatient Hospital Stay: Payer: Medicaid Other | Attending: Hematology

## 2018-11-20 ENCOUNTER — Ambulatory Visit
Admission: RE | Admit: 2018-11-20 | Discharge: 2018-11-20 | Disposition: A | Discharge status: Home / Self Care | Payer: Medicaid Other | Source: Ambulatory Visit | Attending: Radiation Oncology | Admitting: Radiation Oncology

## 2018-11-20 ENCOUNTER — Ambulatory Visit: Payer: Medicaid Other

## 2018-11-20 DIAGNOSIS — T451X5A Adverse effect of antineoplastic and immunosuppressive drugs, initial encounter: Secondary | ICD-10-CM | POA: Insufficient documentation

## 2018-11-20 DIAGNOSIS — Z801 Family history of malignant neoplasm of trachea, bronchus and lung: Secondary | ICD-10-CM | POA: Insufficient documentation

## 2018-11-20 DIAGNOSIS — C250 Malignant neoplasm of head of pancreas: Secondary | ICD-10-CM | POA: Insufficient documentation

## 2018-11-20 DIAGNOSIS — E876 Hypokalemia: Secondary | ICD-10-CM | POA: Insufficient documentation

## 2018-11-20 DIAGNOSIS — Z79899 Other long term (current) drug therapy: Secondary | ICD-10-CM | POA: Insufficient documentation

## 2018-11-20 DIAGNOSIS — F17210 Nicotine dependence, cigarettes, uncomplicated: Secondary | ICD-10-CM | POA: Insufficient documentation

## 2018-11-20 DIAGNOSIS — K529 Noninfective gastroenteritis and colitis, unspecified: Secondary | ICD-10-CM | POA: Insufficient documentation

## 2018-11-20 DIAGNOSIS — I700 Atherosclerosis of aorta: Secondary | ICD-10-CM | POA: Insufficient documentation

## 2018-11-20 DIAGNOSIS — D563 Thalassemia minor: Secondary | ICD-10-CM | POA: Insufficient documentation

## 2018-11-20 DIAGNOSIS — R630 Anorexia: Secondary | ICD-10-CM | POA: Insufficient documentation

## 2018-11-20 DIAGNOSIS — D6481 Anemia due to antineoplastic chemotherapy: Secondary | ICD-10-CM | POA: Insufficient documentation

## 2018-11-20 DIAGNOSIS — G893 Neoplasm related pain (acute) (chronic): Secondary | ICD-10-CM | POA: Insufficient documentation

## 2018-11-20 DIAGNOSIS — Z510 Encounter for antineoplastic radiation therapy: Secondary | ICD-10-CM | POA: Diagnosis not present

## 2018-11-20 DIAGNOSIS — Z9049 Acquired absence of other specified parts of digestive tract: Secondary | ICD-10-CM | POA: Insufficient documentation

## 2018-11-20 NOTE — Telephone Encounter (Signed)
Called pt regarding missing lab/flush/IVF appts today, no answer & unable to leave VM.  RN Malachy Mood (MD Burr Medico) made aware.

## 2018-11-21 ENCOUNTER — Telehealth: Payer: Self-pay

## 2018-11-21 ENCOUNTER — Ambulatory Visit
Admission: RE | Admit: 2018-11-21 | Discharge: 2018-11-21 | Disposition: A | Discharge status: Home / Self Care | Payer: Medicaid Other | Source: Ambulatory Visit | Attending: Radiation Oncology | Admitting: Radiation Oncology

## 2018-11-21 ENCOUNTER — Other Ambulatory Visit: Payer: Self-pay

## 2018-11-21 ENCOUNTER — Other Ambulatory Visit: Payer: Self-pay | Admitting: Nurse Practitioner

## 2018-11-21 ENCOUNTER — Encounter: Payer: Self-pay | Admitting: Radiation Oncology

## 2018-11-21 DIAGNOSIS — C259 Malignant neoplasm of pancreas, unspecified: Secondary | ICD-10-CM

## 2018-11-21 DIAGNOSIS — Z510 Encounter for antineoplastic radiation therapy: Secondary | ICD-10-CM | POA: Diagnosis not present

## 2018-11-21 MED ORDER — GABAPENTIN 300 MG PO CAPS: 300.0000 mg | ORAL_CAPSULE | Freq: Three times a day (TID) | ORAL | 1 refills | Status: DC

## 2018-11-21 MED ORDER — MORPHINE SULFATE 15 MG PO TABS: 15.0000 mg | ORAL_TABLET | Freq: Four times a day (QID) | ORAL | 0 refills | Status: DC | PRN

## 2018-11-21 MED FILL — MORPHINE SULFATE IR 15 MG T: 15 | 11 days supply | Qty: 45 | Fill #0

## 2018-11-21 NOTE — Telephone Encounter (Signed)
Spoke with patient to let him know refill has been sent in for MSIR 15 mg to take one every 6 hours not every 4 to 6 hours.  Also re-emphasized she needs to take the Gabapentin three times daily.  She verbalized an understanding.

## 2018-11-27 ENCOUNTER — Inpatient Hospital Stay (HOSPITAL_BASED_OUTPATIENT_CLINIC_OR_DEPARTMENT_OTHER): Payer: Medicaid Other | Admitting: Nurse Practitioner

## 2018-11-27 DIAGNOSIS — Z5329 Procedure and treatment not carried out because of patient's decision for other reasons: Secondary | ICD-10-CM

## 2018-11-27 DIAGNOSIS — Z91199 Patient's noncompliance with other medical treatment and regimen due to unspecified reason: Secondary | ICD-10-CM

## 2018-11-27 NOTE — Progress Notes (Signed)
No Show for virtual visit. Left message to call me back. Will send message to r/s.  Cira Rue, NP 11/27/2018

## 2018-11-28 ENCOUNTER — Telehealth: Payer: Self-pay | Admitting: Nurse Practitioner

## 2018-11-28 NOTE — Telephone Encounter (Signed)
Scheduled appt per 11/11 los.  Left a VM of the appt date and time.

## 2018-11-29 ENCOUNTER — Ambulatory Visit: Payer: Medicaid Other | Attending: Nurse Practitioner | Admitting: Physical Therapy

## 2018-12-02 ENCOUNTER — Telehealth: Payer: Self-pay

## 2018-12-02 ENCOUNTER — Inpatient Hospital Stay (HOSPITAL_BASED_OUTPATIENT_CLINIC_OR_DEPARTMENT_OTHER): Payer: Medicaid Other | Admitting: Medical

## 2018-12-02 ENCOUNTER — Inpatient Hospital Stay: Payer: Medicaid Other

## 2018-12-02 ENCOUNTER — Other Ambulatory Visit: Payer: Self-pay

## 2018-12-02 ENCOUNTER — Telehealth: Payer: Self-pay | Admitting: Medical

## 2018-12-02 VITALS — BP 106/85 | HR 86 | Temp 98.3°F | Resp 18 | Wt 85.7 lb

## 2018-12-02 VITALS — BP 107/81 | HR 111 | Temp 97.9°F | Resp 18 | Wt 87.5 lb

## 2018-12-02 DIAGNOSIS — R1084 Generalized abdominal pain: Secondary | ICD-10-CM | POA: Diagnosis not present

## 2018-12-02 DIAGNOSIS — D563 Thalassemia minor: Secondary | ICD-10-CM | POA: Diagnosis not present

## 2018-12-02 DIAGNOSIS — F17210 Nicotine dependence, cigarettes, uncomplicated: Secondary | ICD-10-CM | POA: Diagnosis not present

## 2018-12-02 DIAGNOSIS — I700 Atherosclerosis of aorta: Secondary | ICD-10-CM | POA: Diagnosis not present

## 2018-12-02 DIAGNOSIS — R630 Anorexia: Secondary | ICD-10-CM | POA: Diagnosis not present

## 2018-12-02 DIAGNOSIS — R197 Diarrhea, unspecified: Secondary | ICD-10-CM

## 2018-12-02 DIAGNOSIS — Z801 Family history of malignant neoplasm of trachea, bronchus and lung: Secondary | ICD-10-CM | POA: Diagnosis not present

## 2018-12-02 DIAGNOSIS — Z9049 Acquired absence of other specified parts of digestive tract: Secondary | ICD-10-CM | POA: Diagnosis not present

## 2018-12-02 DIAGNOSIS — C259 Malignant neoplasm of pancreas, unspecified: Secondary | ICD-10-CM

## 2018-12-02 DIAGNOSIS — T451X5A Adverse effect of antineoplastic and immunosuppressive drugs, initial encounter: Secondary | ICD-10-CM | POA: Diagnosis not present

## 2018-12-02 DIAGNOSIS — G893 Neoplasm related pain (acute) (chronic): Secondary | ICD-10-CM | POA: Diagnosis not present

## 2018-12-02 DIAGNOSIS — D6481 Anemia due to antineoplastic chemotherapy: Secondary | ICD-10-CM | POA: Diagnosis not present

## 2018-12-02 DIAGNOSIS — R112 Nausea with vomiting, unspecified: Secondary | ICD-10-CM | POA: Diagnosis not present

## 2018-12-02 DIAGNOSIS — C250 Malignant neoplasm of head of pancreas: Secondary | ICD-10-CM | POA: Diagnosis not present

## 2018-12-02 DIAGNOSIS — K529 Noninfective gastroenteritis and colitis, unspecified: Secondary | ICD-10-CM | POA: Diagnosis not present

## 2018-12-02 DIAGNOSIS — C257 Malignant neoplasm of other parts of pancreas: Secondary | ICD-10-CM | POA: Diagnosis present

## 2018-12-02 DIAGNOSIS — E876 Hypokalemia: Secondary | ICD-10-CM | POA: Diagnosis not present

## 2018-12-02 DIAGNOSIS — Z79899 Other long term (current) drug therapy: Secondary | ICD-10-CM | POA: Diagnosis not present

## 2018-12-02 LAB — CBC WITH DIFFERENTIAL (CANCER CENTER ONLY)
Abs Immature Granulocytes: 0.04 10*3/uL (ref 0.00–0.07)
Basophils Absolute: 0 10*3/uL (ref 0.0–0.1)
Basophils Relative: 0 %
Eosinophils Absolute: 0 10*3/uL (ref 0.0–0.5)
Eosinophils Relative: 1 %
HCT: 32.4 % — ABNORMAL LOW (ref 36.0–46.0)
Hemoglobin: 10.7 g/dL — ABNORMAL LOW (ref 12.0–15.0)
Immature Granulocytes: 1 %
Lymphocytes Relative: 7 %
Lymphs Abs: 0.4 10*3/uL — ABNORMAL LOW (ref 0.7–4.0)
MCH: 20.9 pg — ABNORMAL LOW (ref 26.0–34.0)
MCHC: 33 g/dL (ref 30.0–36.0)
MCV: 63.2 fL — ABNORMAL LOW (ref 80.0–100.0)
Monocytes Absolute: 0.4 10*3/uL (ref 0.1–1.0)
Monocytes Relative: 7 %
Neutro Abs: 4.6 10*3/uL (ref 1.7–7.7)
Neutrophils Relative %: 84 %
Platelet Count: 209 10*3/uL (ref 150–400)
RBC: 5.13 MIL/uL — ABNORMAL HIGH (ref 3.87–5.11)
RDW: 19.1 % — ABNORMAL HIGH (ref 11.5–15.5)
WBC Count: 5.4 10*3/uL (ref 4.0–10.5)
nRBC: 0 % (ref 0.0–0.2)

## 2018-12-02 LAB — CMP (CANCER CENTER ONLY)
ALT: 37 U/L (ref 0–44)
AST: 50 U/L — ABNORMAL HIGH (ref 15–41)
Albumin: 3.2 g/dL — ABNORMAL LOW (ref 3.5–5.0)
Alkaline Phosphatase: 129 U/L — ABNORMAL HIGH (ref 38–126)
Anion gap: 12 (ref 5–15)
BUN: 7 mg/dL (ref 6–20)
CO2: 24 mmol/L (ref 22–32)
Calcium: 8.1 mg/dL — ABNORMAL LOW (ref 8.9–10.3)
Chloride: 104 mmol/L (ref 98–111)
Creatinine: 0.57 mg/dL (ref 0.44–1.00)
GFR, Est AFR Am: >60 mL/min (ref >60)
GFR, Estimated: >60 mL/min (ref >60)
Glucose, Bld: 90 mg/dL (ref 70–99)
Potassium: 3.9 mmol/L (ref 3.5–5.1)
Sodium: 140 mmol/L (ref 135–145)
Total Bilirubin: 0.5 mg/dL (ref 0.3–1.2)
Total Protein: 6 g/dL — ABNORMAL LOW (ref 6.5–8.1)

## 2018-12-02 MED ORDER — ONDANSETRON HCL 4 MG/2ML IJ SOLN
INTRAMUSCULAR | Status: AC
  Filled 2018-12-02: qty 4

## 2018-12-02 MED ORDER — SODIUM CHLORIDE 0.9 % IV SOLN
Freq: Once | INTRAVENOUS | Status: AC
  Administered 2018-12-02: 09:00:00 via INTRAVENOUS
  Filled 2018-12-02: qty 250

## 2018-12-02 MED ORDER — MORPHINE SULFATE 15 MG PO TABS: 15.0000 mg | ORAL_TABLET | Freq: Four times a day (QID) | ORAL | 0 refills | Status: DC | PRN

## 2018-12-02 MED ORDER — SODIUM CHLORIDE 0.9% FLUSH
10.0000 mL | Freq: Once | INTRAVENOUS | Status: AC | PRN
  Administered 2018-12-02: 11:00:00 10 mL
  Filled 2018-12-02: qty 10

## 2018-12-02 MED ORDER — HYDROMORPHONE HCL 2 MG/ML IJ SOLN
INTRAMUSCULAR | Status: AC
  Filled 2018-12-02: qty 1

## 2018-12-02 MED ORDER — DEXAMETHASONE SODIUM PHOSPHATE 10 MG/ML IJ SOLN
INTRAMUSCULAR | Status: AC
  Filled 2018-12-02: qty 1

## 2018-12-02 MED ORDER — HYDROMORPHONE HCL 2 MG/ML IJ SOLN
2.0000 mg | Freq: Once | INTRAMUSCULAR | Status: AC
  Administered 2018-12-02: 2 mg via INTRAVENOUS
  Filled 2018-12-02: qty 1

## 2018-12-02 MED ORDER — HEPARIN SOD (PORK) LOCK FLUSH 100 UNIT/ML IV SOLN
500.0000 [IU] | Freq: Once | INTRAVENOUS | Status: AC | PRN
  Administered 2018-12-02: 500 [IU]
  Filled 2018-12-02: qty 5

## 2018-12-02 MED ORDER — DEXAMETHASONE SODIUM PHOSPHATE 10 MG/ML IJ SOLN
8.0000 mg | Freq: Once | INTRAMUSCULAR | Status: AC
  Administered 2018-12-02: 8 mg via INTRAVENOUS

## 2018-12-02 MED ORDER — ONDANSETRON HCL 4 MG/2ML IJ SOLN
8.0000 mg | Freq: Once | INTRAMUSCULAR | Status: AC
  Administered 2018-12-02: 8 mg via INTRAVENOUS

## 2018-12-02 MED ORDER — SODIUM CHLORIDE 0.9 % IV SOLN: Freq: Once | INTRAVENOUS | Status: DC

## 2018-12-02 MED ORDER — POTASSIUM CHLORIDE CRYS ER 20 MEQ PO TBCR: 20.0000 meq | EXTENDED_RELEASE_TABLET | Freq: Every day | ORAL | 1 refills | Status: AC

## 2018-12-02 MED FILL — MORPHINE SULFATE IR 15 MG T: 15 | 11 days supply | Qty: 45 | Fill #0

## 2018-12-02 MED FILL — POTASSIUM CHLORIDE CRYS ER: 20 | 30 days supply | Qty: 30 | Fill #0

## 2018-12-02 NOTE — Telephone Encounter (Signed)
Pt walked in today stating she would like fluids today Ok by Dr. Burr Medico to see Jane Mealy PA today to receive IV fluids over 1 hour. Pt stated that she has had nausea and vomiting starting from last Friday. Pt stated that she also has pain in the abdomen pain 8/10 Port accessed IV fluids started [employer] notified of pain, Per Jane Brooks zofran  8 mg and decadron 8 mg given to Pt. Stated nausea was subsiding. Per Jane Mealy PA 2 mg of Dilaudid was ordered. Jane Favia RN gave Dilaudid to Pt. Waited for 30 minutes for observation. Pt stated she was ok didn't feel any adverse reaction from medication. Port flushed and  de accessed with Proberta needle in tact band-aid placed. AVS given to Pt. Discharged from symptom management.

## 2018-12-02 NOTE — Patient Instructions (Signed)

## 2018-12-02 NOTE — Telephone Encounter (Signed)
Per Dr. Burr Medico  OK to give fluids over 1 hour

## 2018-12-02 NOTE — Telephone Encounter (Signed)
Scheduled appt per 11/16 sch message - pt aware of appt date and time

## 2018-12-03 ENCOUNTER — Encounter: Payer: Self-pay | Admitting: Nurse Practitioner

## 2018-12-03 ENCOUNTER — Inpatient Hospital Stay (HOSPITAL_BASED_OUTPATIENT_CLINIC_OR_DEPARTMENT_OTHER): Payer: Medicaid Other | Admitting: Nurse Practitioner

## 2018-12-03 DIAGNOSIS — R1084 Generalized abdominal pain: Secondary | ICD-10-CM

## 2018-12-03 DIAGNOSIS — R110 Nausea: Secondary | ICD-10-CM

## 2018-12-03 MED ORDER — DRONABINOL 5 MG PO CAPS: 5.0000 mg | ORAL_CAPSULE | Freq: Two times a day (BID) | ORAL | 0 refills | Status: DC

## 2018-12-03 MED FILL — DRONABINOL 5 MG CAP: 5 | 30 days supply | Qty: 60 | Fill #0

## 2018-12-03 NOTE — Progress Notes (Signed)
[place]   [phone] [phone]   Clinic Follow up Note   Patient Care Team: Patient, No Pcp Per as PCP - General (General Practice) Milus Banister, MD as Attending Physician (Gastroenterology) Truitt Merle, MD as Consulting Physician (Hematology) 12/03/2018  I connected with Marcelo Baldy on 12/03/18 at 8:50 AM EDT by telephone visit and verified that I am speaking with the correct person using two identifiers.   I discussed the limitations, risks, security and privacy concerns of performing an evaluation and management service by telemedicine and the availability of in-person appointments. I also discussed with the patient that there may be a patient responsible charge related to this service. The patient expressed understanding and agreed to proceed.   Other persons participating in the visit and their role in the encounter: None   Patient's location: Home  Provider's location: my office, [place]: F/u nausea, pain   SUMMARY OF ONCOLOGIC HISTORY: Oncology History Overview Note  Cancer Staging Pancreatic cancer [facility] Staging form: Exocrine Pancreas, AJCC 8th Edition - Clinical stage from 11/08/2017: Stage IB (cT2, cN0, cM0) - Signed by Truitt Merle, MD on 11/09/2017     Malignant neoplasm of head of pancreas ([place])  10/30/2017 Imaging   MRI Abdomen 10/30/17  IMPRESSION: 1. Marked intra and extrahepatic biliary duct dilatation with abrupt cut off of the common bile duct at the pancreatic head. This is associated with marked dilatation of the main pancreatic duct also with an abrupt cut off at the level of the pancreatic head. Pancreatic parenchyma in the body and tail of pancreas is markedly atrophic. A subtle 2.5 x 3 cm lesion with differential signal intensity is identified in the head of the pancreas which appears slightly hypoenhancing on postcontrast imaging. Together, these features are highly concerning for  pancreatic adenocarcinoma. EUS is recommended to further evaluate. 2. Portal vein is patent although there is some mass-effect on the portal splenic confluence and IVC. Fat planes around celiac axis and SMA appear preserved. 3. Upper normal to borderline portal caval lymph node associated with small right para-aortic lymphadenopathy.     10/30/2017 Tumor Marker   Baseline Ca 19-9 at 140   10/31/2017 Procedure   ERCP by Dr. Lyndel Safe 10/31/17  IMPRESSION Malignant appearing CBD stricture (due to pancreatic mass) status post sphincterotomy and stent insertion.   11/08/2017 Cancer Staging   Staging form: Exocrine Pancreas, AJCC 8th Edition - Clinical stage from 11/08/2017: Stage IB (cT2, cN0, cM0) - Signed by Truitt Merle, MD on 11/09/2017   11/08/2017 Procedure   EUS and EGD by Dr. Ardis Hughs 11/08/17  IMPRESSION -Very vague, irregularly bordered, approximately 2.6cm mass was found in the head of pancreas. This causes biliary and pancreatic duct obstruction. The previously plastic biliary stent is in good position. The mass directly abuts and attenuates the PV/SMV confluence, suggesting invasion. FNA performed and the preliminary cytology review is positive for maligancy (adenocarcinoma). - [facility] office will arrange referrals to medical and surgical oncology. - Case to be discussed at upcoming multidisciplinary GI tumor conference.   11/08/2017 Initial Biopsy   Biopsy Cytology 11/08/17 Diagnosis FINE NEEDLE ASPIRATION, ENDOSCOPIC, PANCREAS HEAD (SPECIMEN 1 OF 1 COLLECTED 11/08/17): MALIGNANT CELLS CONSISTENT WITH ADENOCARCINOMA.   11/09/2017 Initial Diagnosis   Pancreatic cancer ([place])   11/15/2017 Imaging   CT Chest and Pelvis WO Contrast 11/15/17 IMPRESSION: 1. No findings of active malignancy in the chest or pelvis. 2. Aortic Atherosclerosis (ICD10-I70.0). Coronary atherosclerosis. 3. Faint centrilobular nodularity in  the lung apices, raising the possibility of  hypersensitivity pneumonitis or less likely respiratory bronchiolitis. Airway thickening is present, suggesting bronchitis or reactive airways disease.   11/19/2017 -  Chemotherapy   FOLFIRINOX q2weeks with Neulasta injection for 4 months starting 11/19/17. Completed 8 cycles on 03/07/18    02/04/2018 Imaging   CT AP W Contrast 02/04/18  IMPRESSION: 1. No substantial change in size of mass involving head of pancreas. A aortocaval lymph node which appears increased in the interval measuring 1.1 cm versus 0.8 cm previously. Stable 1.2 cm peripancreatic node. 2. Dilated proximal common bile duct is mildly increased in caliber compared with 12/12/2017. The common bile duct stent remains in place in appears to be in appropriate position.    04/19/2018 Imaging   CT AP 04/19/18  IMPRESSION: 1. Essentially stable exam compared to most recent CT scans of 02/04/2018 and 12/12/2017. 2. Stable mass in the head of the pancreas with duct dilatation and atrophy upstream. Mass continues to surround the GDA and contacts the portal vein but is free of the celiac trunk and SMA. Mass probably contacts the RIGHT hepatic artery at the takeoff of the GDA (image 56/5). Dominant LEFT hepatic artery originates from the LEFT gastric. 3. Mild intrahepatic and extrahepatic duct dilatation similar to prior with flexible stent in the common bile duct. 4. No new or progressive disease. 5. No peritoneal metastasis.   04/25/2018 Imaging   CT Angio chest 04/25/18  IMPRESSION: 1. No demonstrable pulmonary embolus. No thoracic aortic aneurysm or dissection. 2. Areas of atelectatic change bilaterally. No consolidation. No pneumothorax. Endotracheal tube tip is slightly superior to the carina. 3. Enlarged subcarinal lymph node, concerning for neoplastic etiology. 4. 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 Biliary stent present. Biliary duct air is felt to be secondary to the stent placement. 5.  Mild upper abdominal ascites. Pneumoperitoneum with apparent drainage catheter present. The pneumoperitoneum is likely of post procedural/postoperative etiology. Bowel perforation in this circumstance cannot be excluded entirely, however. Close clinical assessment in this regard advised. 6.  Left ventricular hypertrophy.   04/25/2018 Surgery   LAPAROSCOPY DIAGNOSTIC and WHIPPLE PROCEDURE, RECONSTRUCTION OF PORTAL VEIN by Dr. Barry Dienes, Dr. Marcello Moores 04/25/18    04/25/2018 Pathology Results    Diagnosis 04/25/18 1. Whipple procedure/resection, with gallbladder with segment of portal vein - INVASIVE ADENOCARCINOMA, MODERATELY DIFFERENTIATED, SPANNING 3.5 CM. - ADENOCARCINOMA IS PRESENT AT THE SUPERIOR MARGIN OF THE ATTACHED VEIN AND THE INFERIOR MARGIN OF THE ATTACHED VEIN (BLOCKS E AND F). - ADENOCARCINOMA EXTENDS INTO PERIPANCREATIC SOFT TISSUE. - PERINEURAL INVASION IS IDENTIFIED, DIFFUSE. - METASTATIC ADENOCARCINOMA IN 1 OF 24 LYMPH NODES (1/24). - SEE ONCOLOGY TABLE BELOW. 2. Lymph node, biopsy, portal - THERE IS NO EVIDENCE OF CARCINOMA IN 5 OF 5 LYMPH NODES (0/5). 3. Pancreas, biopsy, additional pancreatic margin - HIGH GRADE GLANDULAR DYSPLASIA, FOCAL    05/09/2018 Imaging   CT AP W Contrast 05/09/18 IMPRESSION: Status post Whipple's procedure. 1 surgical drain remains in the area. Pancreatic duct stent is noted.   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 It appears to be stable in size compared to prior exam.   Wall and fold thickening is seen involving right and transverse colon which may represent edema or possibly inflammation.   Aortic Atherosclerosis (ICD10-I70.0).   06/15/2018 Imaging   CT AP W Contrast 06/15/18 IMPRESSION: Changes of prior Whipple procedure. Previously  seen pancreatic duct stent presumably has migrated across the choledocho-jejunostomy and is now located  in the biliary ducts. No biliary or pancreatic ductal dilatation.   Severe diffuse low-density throughout the liver, likely fatty infiltration.   Wall thickening within the ascending colon and hepatic flexure concerning for colitis.   Small amount of free fluid in the pelvis.   Aortic atherosclerosis.   07/25/2018 PET scan   PET 07/25/18  IMPRESSION: 1. Right hepatic lobe hypermetabolism, in the setting of heterogeneous hepatic steatosis. Finding is highly suspicious for isolated hepatic metastasis. 2. More equivocal lower level hypermetabolism along the medial hepatic capsule and within the hepatic dome. These areas could be postoperative and physiologic respectively. Recommend attention on follow-up. 3. Coronary artery atherosclerosis. Aortic Atherosclerosis (ICD10-I70.0).   08/13/2018 Pathology Results   Diagnosis 08/13/18 Liver, needle/core biopsy, right - LIVER PARENCHYMA WITH A BILE EXTRAVASATION AND HISTIOCYTES - SEE COMMENT   08/21/2018 Imaging   CT AP W Contrast  IMPRESSION: 1. Diffuse colonic wall thickening and submucosal edema suggestive of colitis, findings are similar to prior study. 2. Interval improvement in the degree of gastric wall thickening and edema compared to prior CT. 3. Small volume ascites, unchanged. 4. Diffuse hepatic steatosis with grossly stable area of non masslike enhancement in the right hepatic lobe. Continued imaging surveillance of this finding is suggested.     10/09/2018 -  Radiation Therapy   Adjuvant radiation with Dr. Lisbeth Renshaw 10/09/18-11/18/18     CURRENT THERAPY: Supportive care    INTERVAL HISTORY: Ama presents for virtual visit. She was seen yesterday in [facility] for IVF and pain medication. She notes she did not have a good weekend, more pain, she refrained from going to ED due to fear of COVID surge. She tried to call early Monday AM for appt but couldn't get through so she walked in. She was grateful for being seen. She got home  after supportive care and slept rest of the day so she was up all night. Today she feels much better. She has mild nausea which "never goes away" but is more mild. Denies vomiting. No BM yet but "doesn't feel like diarrhea." She is using scopolamine patch which is helping. Hasn't tried phenergan supp yet but will do so if needed. She always has pain that fluctuates, takes either bentyl or MSIR q6 hours depending on her symptoms. She will try to eat soon. Supplements make her throw up. She is interested in trying marinol again, but notes 2.5 mg was not very effective. Otherwise, she is doing OK this morning. Denies new fever, chills, cough, chest pain, dyspnea or other specific concerns.    MEDICAL HISTORY:  Past Medical History:  Diagnosis Date  . Family history of lung cancer   . Family history of prostate cancer   . Family history of uterine cancer   . Gallstones 10/2017  . Pancreatic cancer ([place])   . Pneumothorax, closed, traumatic    years ago  . PONV (postoperative nausea and vomiting)     SURGICAL HISTORY: Past Surgical History:  Procedure Laterality Date  . BILIARY STENT PLACEMENT  10/31/2017   Procedure: BILIARY STENT PLACEMENT;  Surgeon: [name], MD;  Location: [facility] ENDOSCOPY;  Service: Gastroenterology;;  . BIOPSY  10/23/2018   Procedure: BIOPSY;  Surgeon: [name], MD;  Location: [facility] ENDOSCOPY;  Service: Endoscopy;;  . COLONOSCOPY WITH PROPOFOL N/A 10/23/2018   Procedure: COLONOSCOPY WITH PROPOFOL;  Surgeon: [name], MD;  Location: [facility] ENDOSCOPY;  Service: Endoscopy;  Laterality: N/A;  .  ERCP N/A 10/31/2017   Procedure: ENDOSCOPIC RETROGRADE CHOLANGIOPANCREATOGRAPHY (ERCP);  Surgeon: [name], MD;  Location: [facility] ENDOSCOPY;  Service: Gastroenterology;  Laterality: N/A;  . ESOPHAGOGASTRODUODENOSCOPY N/A 11/08/2017   Procedure: ESOPHAGOGASTRODUODENOSCOPY (EGD);  Surgeon: Milus Banister, MD;  Location: [facility] ENDOSCOPY;  Service: Endoscopy;  Laterality: N/A;  . EUS  N/A 11/08/2017   Procedure: UPPER ENDOSCOPIC ULTRASOUND (EUS) RADIAL;  Surgeon: Milus Banister, MD;  Location: [facility] ENDOSCOPY;  Service: Endoscopy;  Laterality: N/A;  . FINE NEEDLE ASPIRATION  11/08/2017   Procedure: FINE NEEDLE ASPIRATION;  Surgeon: Milus Banister, MD;  Location: [facility] ENDOSCOPY;  Service: Endoscopy;;  . IR IMAGING GUIDED PORT INSERTION  11/15/2017  . LAPAROSCOPY N/A 04/25/2018   Procedure: LAPAROSCOPY DIAGNOSTIC;  Surgeon: Stark Klein, MD;  Location: [place];  Service: General;  Laterality: N/A;  GENERAL AND EPIDURAL ANESTHESIA  . SPHINCTEROTOMY  10/31/2017   Procedure: SPHINCTEROTOMY;  Surgeon: [name], MD;  Location: [facility] ENDOSCOPY;  Service: Gastroenterology;;  . TONSILLECTOMY    . WHIPPLE PROCEDURE N/A 04/25/2018   Procedure: WHIPPLE PROCEDURE, RECONSTRUCTION OF PORTAL VEIN;  Surgeon: Stark Klein, MD;  Location: [place];  Service: General;  Laterality: N/A;    I have reviewed the social history and family history with the patient and they are unchanged from previous note.  ALLERGIES:  is allergic to latex.  MEDICATIONS:  Current Outpatient Medications  Medication Sig Dispense Refill  . acetaminophen (TYLENOL) 325 MG tablet Take 2 tablets (650 mg total) by mouth every 6 (six) hours as needed for mild pain (or Fever >/= 101). 30 tablet 0  . cyclobenzaprine (FEXMID) 7.5 MG tablet Take 1 tablet (7.5 mg total) by mouth 3 (three) times daily as needed for muscle spasms. 30 tablet 0  . dicyclomine (BENTYL) 10 MG capsule Take 1 capsule (10 mg total) by mouth 3 (three) times daily between meals as needed for spasms. 30 capsule 0  . diphenoxylate-atropine (LOMOTIL) 2.5-0.025 MG tablet Take 1 to 2 tablets PO QID prn diarrhea (Patient not taking: Reported on 10/22/2018) 40 tablet 0  . dronabinol (MARINOL) 5 MG capsule Take 1 capsule (5 mg total) by mouth 2 (two) times daily before lunch and supper. 60 capsule 0  . gabapentin (NEURONTIN) 300 MG capsule Take 1 capsule (300 mg total)  by mouth 3 (three) times daily. 90 capsule 1  . lipase/protease/amylase (CREON) 36000 UNITS CPEP capsule Take 36,000 Units by mouth 3 (three) times daily before meals.    Marland Kitchen morphine (MS CONTIN) 15 MG 12 hr tablet Take 1 tablet (15 mg total) by mouth every 12 (twelve) hours. 60 tablet 0  . morphine (MSIR) 15 MG tablet Take 1 tablet (15 mg total) by mouth every 6 (six) hours as needed for severe pain. 45 tablet 0  . nicotine (NICODERM CQ - DOSED IN MG/24 HR) 7 mg/24hr patch Place 1 patch (7 mg total) onto the skin daily. 28 patch 0  . ondansetron (ZOFRAN ODT) 8 MG disintegrating tablet Take 1 tablet (8 mg total) by mouth every 8 (eight) hours as needed for nausea or vomiting. 20 tablet 1  . pantoprazole (PROTONIX) 40 MG tablet Take 1 tablet (40 mg total) by mouth daily. (Patient not taking: Reported on 11/06/2018) 60 tablet 3  . polyethylene glycol (MIRALAX / GLYCOLAX) 17 g packet Take 17 g by mouth daily. 14 each 0  . potassium chloride SA (KLOR-CON) 20 MEQ tablet Take 1 tablet (20 mEq total) by mouth daily. 30 tablet 1  . promethazine (PHENERGAN)  25 MG suppository Place 1 suppository (25 mg total) rectally every 6 (six) hours as needed for nausea or vomiting. Do not take within 6 hours of oral phenergan tablet 12 each 1  . promethazine (PHENERGAN) 25 MG tablet Take 1 tablet (25 mg total) by mouth every 6 (six) hours as needed for nausea or vomiting. (Patient not taking: Reported on 11/06/2018) 30 tablet 0  . scopolamine (TRANSDERM-SCOP) 1 MG/3DAYS Place 1 patch (1.5 mg total) onto the skin every 3 (three) days. 10 patch 3   No current facility-administered medications for this visit.     PHYSICAL EXAMINATION:  There were no vitals filed for this visit. There were no vitals filed for this visit.  Patient appears well over the phone. Speech is normal and non-pressured. No cough or conversational dyspnea. Mood and affect appear normal for situation.   LABORATORY DATA:  No labs for this visit.     RADIOGRAPHIC STUDIES: I have personally reviewed the radiological images as listed and agreed with the findings in the report. No results found.   ASSESSMENT & PLAN: Jane Brooks is a 48 yo female with   1. Abdominal pain, n/v/d -Her pain and nausea are better controlled today after IVF, IV anti-emetics, and pain meds on 11/16 in [facility].  -we reviewed admin instructions and dosing for her supportive meds at home. She will try phenergan suppository if needed. Scopolamine patch and zofran have been utilized in the interval with moderate efficacy -for persistent nausea and anorexia with weight loss, will try marinol 5 mg BID AC. She does not tolerate nutrition supplements very well.  -she has not had BM yet today -Continue MS contin, MSIR, and dicyclomine for abd pain and spasm -she will return for [facility] f/u and supportive care on 11/19. I strongly encouraged her to keep her appointments, she agrees.   2. 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 -Her postoprecovery has been difficult withrecurrentabdominal pain, nausea, vomiting and diarrhea, multiple ED visits and hospitalization -HerPET from 07/25/18 showedright hepatic lobe hypermetabolic activity, which is suspicious for isolated liver metastasis. There is also mild uptake of LN alongmedial hepatic capsule and within the hepatic dome, no other evidence of mets. -Her liver biopsy from 08/13/18 showsno evidence of cancer cells.  - IRDr. Chestine Spore her sample was adequate and did not recommend a rebiopsy. - CT on 08/21/2018 done during her ED visit showed no clear evidence of metastasis -Due to her positive surgical margins, she was referred for adjuvant radiation per  Dr. Lisbeth Renshaw which she completed from 10/09/18 - 11/21/18 -she had increased abdominal pain, n/v after starting RT and required hospital admission for management. She has canceled several f/u and supportive care appointments during RT. -will continue close observation and aggressive supportive care for her pain and GI symptoms  -Next lab/flush and f/u with Dr. Burr Medico on 11/24  3. Smoking Cessation  -still smoking but wants to quit -has been using nicotine patch recently which has curbed smoking somewhat but she has not quit yet -I encouraged her to keep trying to quit cigarettes and marijuana  PLAN: -Reviewed symptom management and supportive care meds at home -I do not feel she needs in-person visit or medical care today -F/u with [facility]F in [facility] on 11/19 as scheduled -Lab, f/u with  Dr. Burr Medico 11/24 -refill Marinol, increase to 5 mg BID AC -reinforced smoking cessation   All questions were answered. The patient knows to call the clinic with any problems, questions or concerns. No barriers to learning were detected. I spent 12 minutes non-face-to-face time on today's phone call, more than 50% was on counseling and coordination of care.     Alla Feeling, NP 12/03/18

## 2018-12-03 NOTE — Progress Notes (Signed)
[facility] Progress Note   Jane Brooks [id number] 11-15-1970 48 y.o.  Jane Brooks is managed by Dr. Truitt Merle  Actively treated with chemotherapy/immunotherapy/hormonal therapy:  Adjuvant radiation with Dr. Lisbeth Renshaw 10/09/18-11/18/18  Last treated: Adjuvant radiation with Dr. Lisbeth Renshaw 10/09/18-11/18/18  Next scheduled appointment with provider: 12/03/2018 (telemedicine visit)  Assessment: Plan:    Generalized abdominal pain - Plan: HYDROmorphone (DILAUDID) injection 2 mg, morphine (MSIR) 15 MG tablet  Pancreatic adenocarcinoma ([place]) - Plan: morphine (MSIR) 15 MG tablet  Non-intractable vomiting with nausea, unspecified vomiting type  Diarrhea, unspecified type  Hypokalemia - Plan: potassium chloride SA (KLOR-CON) 20 MEQ tablet   Generalized abdominal pain: The patient was given Dilaudid 2 mg IV x1 and was given a refill of morphine sulfate immediate release 15 mg every 6 hours with 45 dispensed with no refills.  Pancreatic adenocarcinoma: The patient is status post adjuvant radiation from 10/09/18-11/18/18.  She has a telemedicine visit scheduled for tomorrow.  Diarrhea with nausea and vomiting: Patient was given 1 L of normal saline IV today.  She will return on 12/05/2018 for additional IV fluids.  History of hypokalemia: The patient was given a refill of potassium chloride SA 20 mEq tablets once daily.  Please see After Visit Summary for patient specific instructions.  Future Appointments  Date Time Provider [place]  12/05/2018  9:00 AM Sandi Mealy E., PA-C [facility] None  12/05/2018  9:30 AM [facility]NAGEMENT [facility] 2 [facility] None  12/10/2018  8:30 AM [facility] LAB 1 [facility] None  12/10/2018  8:45 AM [facility] FL[facility] None  12/10/2018  9:20 AM Truitt Merle, MD [facility] None    No orders of the defined types were placed in this encounter.      Subjective:   Patient ID:  Jane Brooks is a 48 y.o. (DOB 10-Feb-1970)  female.  Chief Complaint: No chief complaint on file.   HPI Jane Brooks  Is a 48 y.o. female with a diagnosis of a malignant neoplasm of the head of the pancreas.  She is managed by Dr. Truitt Merle and is status post a Whipple's procedure and resection of her gallbladder with segment of the portal vein on 04/25/2018.  She has most recently completed adjuvant radiation therapy with Dr. Lisbeth Renshaw from 10/09/2018 through 11/18/2018.  She has a history of abdominal pain which is believed to be secondary to colitis.  Her last CT scan completed on 11/07/2018 returned showing:  Lower chest: The visualized heart size within normal limits. No pericardial fluid/thickening.  No hiatal hernia.  The visualized portions of the lungs are clear.  Hepatobiliary: Again noted is diffuse low density seen throughout the liver parenchyma with heterogeneous enhancement in the posterior right liver lobe.The main portal vein is patent. The patient is status post cholecystectomy. No biliary ductal dilation.  Pancreas: There is atrophy within the mid body and tail of the pancreas with a prior whipple procedure.  Spleen: Normal in size without focal abnormality.  Adrenals/Urinary Tract: Both adrenal glands appear normal. The kidneys and collecting system appear normal without evidence of urinary tract calculus or hydronephrosis. Bladder is unremarkable.  Stomach/Bowel: The stomach is grossly unremarkable. There remains diffuse bowel wall thickening and edema within the proximal transverse colon and hepatic flexure. There is also question of mild wall thickening seen in the sigmoid colon. This appears to be improved since the prior exam. There is a moderate to large amount of colonic stool present.The appendix is normal.  Vascular/Lymphatic: There are no enlarged mesenteric,  retroperitoneal, or pelvic lymph nodes. Scattered aortic atherosclerotic calcifications are seen without aneurysmal  dilatation.  Reproductive: The uterus and adnexa are unremarkable.  Other: No evidence of abdominal wall mass or hernia.  Musculoskeletal: No acute or significant osseous findings.  She presents to the office today with nausea, vomiting, diarrhea, and abdominal pain since last Friday.  She reports having chills and sweats but no fever.  She requires a refill of morphine sulfate immediate release 15 mg tablets which are prescribed for every 6 hours.  She has been taking them every 4-6 hours until her last refill when she was told to take these every 6 hours.  Medications: I have reviewed the patient's current medications.  Allergies:  Allergies  Allergen Reactions   Latex Hives    Past Medical History:  Diagnosis Date   Family history of lung cancer    Family history of prostate cancer    Family history of uterine cancer    Gallstones 10/2017   Pancreatic cancer ([place])    Pneumothorax, closed, traumatic    years ago   PONV (postoperative nausea and vomiting)     Past Surgical History:  Procedure Laterality Date   BILIARY STENT PLACEMENT  10/31/2017   Procedure: BILIARY STENT PLACEMENT;  Surgeon: [name], MD;  Location: [facility];  Service: Gastroenterology;;   BIOPSY  10/23/2018   Procedure: BIOPSY;  Surgeon: [name], MD;  Location: [facility];  Service: Endoscopy;;   COLONOSCOPY WITH PROPOFOL N/A 10/23/2018   Procedure: COLONOSCOPY WITH PROPOFOL;  Surgeon: [name], MD;  Location: [facility];  Service: Endoscopy;  Laterality: N/A;   ERCP N/A 10/31/2017   Procedure: ENDOSCOPIC RETROGRADE CHOLANGIOPANCREATOGRAPHY (ERCP);  Surgeon: [name], MD;  Location[facility];  Service: Gastroenterology;  Laterality: N/A;   ESOPHAGOGASTRODUODENOSCOPY N/A 11/08/2017   Procedure: ESOPHAGOGASTRODUODENOSCOPY (EGD);  Surgeon: Milus Banister, MD;  Location: [facility];  Service: Endoscopy;  Laterality: N/A;   EUS N/A 11/08/2017   Procedure:  UPPER ENDOSCOPIC ULTRASOUND (EUS) RADIAL;  Surgeon: Milus Banister, MD;  Location: [facility];  Service: Endoscopy;  Laterality: N/A;   FINE NEEDLE ASPIRATION  11/08/2017   Procedure: FINE NEEDLE ASPIRATION;  Surgeon: Milus Banister, MD;  Location: [facility];  Service: Endoscopy;;   IR IMAGING GUIDED PORT INSERTION  11/15/2017   LAPAROSCOPY N/A 04/25/2018   Procedure: LAPAROSCOPY DIAGNOSTIC;  Surgeon: Stark Klein, MD;  Location: [facility];  Service: General;  Laterality: N/A;  GENERAL AND EPIDURAL ANESTHESIA   SPHINCTEROTOMY  10/31/2017   Procedure: SPHINCTEROTOMY;  Surgeon: [name], MD;  Location: [facility];  Service: Gastroenterology;;   TONSILLECTOMY     WHIPPLE PROCEDURE N/A 04/25/2018   Procedure: WHIPPLE PROCEDURE, RECONSTRUCTION OF PORTAL VEIN;  Surgeon: Stark Klein, MD;  Location: [facility] [facility];  Service: General;  Laterality: N/A;    Family History  Problem Relation Age of Onset   Diabetes Mother    Hypertension Mother    COPD Mother    Uterine cancer Mother 43       had hysterectomy   Diabetes Sister    Hypertension Sister    Lung cancer Maternal Grandmother        lung cancer   Hypertension Sister     Social History   Socioeconomic History   Marital status: Single    Spouse name: Not on file   Number of children: Not on file   Years of education: Not on file   Highest education level: Not on file  Occupational History  Occupation: unemployed  Scientist, product/process development strain: Not on file   Food insecurity    Worry: Not on file    Inability: Not on file   Transportation needs    Medical: No    Non-medical: No  Tobacco Use   Smoking status: Current Every Day Smoker    Packs/day: 1.00    Years: 37.00    Pack years: 37.00    Types: Cigarettes   Smokeless tobacco: Never Used   Tobacco comment: Started back smoking  Substance and Sexual Activity   Alcohol use: Not Currently   Drug use: Yes    Types: Marijuana    Sexual activity: Not Currently  Lifestyle   Physical activity    Days per week: Not on file    Minutes per session: Not on file   Stress: Not on file  Relationships   Social connections    Talks on phone: Not on file    Gets together: Not on file    Attends religious service: Not on file    Active member of club or organization: Not on file    Attends meetings of clubs or organizations: Not on file    Relationship status: Not on file   Intimate partner violence    Fear of current or ex partner: Not on file    Emotionally abused: Not on file    Physically abused: Not on file    Forced sexual activity: Not on file  Other Topics Concern   Not on file  Social History Narrative   Not on file    Past Medical History, Surgical history, Social history, and Family history were reviewed and updated as appropriate.   Please see review of systems for further details on the patient's review from today.   Review of Systems:  Review of Systems  Constitutional: Positive for unexpected weight change. Negative for appetite change, chills, diaphoresis and fever.  HENT: Negative for trouble swallowing.   Respiratory: Negative for cough, chest tightness and shortness of breath.   Cardiovascular: Negative for chest pain, palpitations and leg swelling.  Gastrointestinal: Positive for diarrhea, nausea and vomiting. Negative for abdominal distention, abdominal pain, blood in stool and constipation.  Genitourinary: Negative for decreased urine volume and difficulty urinating.  Neurological: Negative for weakness.    Objective:   Physical Exam:  There were no vitals taken for this visit. ECOG: 1  Physical Exam Constitutional:      General: She is not in acute distress.    Appearance: She is not diaphoretic.  HENT:     Head: Normocephalic and atraumatic.  Eyes:     General: No scleral icterus.       Right eye: No discharge.        Left eye: No discharge.     Conjunctiva/sclera:  Conjunctivae normal.  Neck:     Musculoskeletal: Normal range of motion and neck supple.  Cardiovascular:     Rate and Rhythm: Regular rhythm. Tachycardia present.     Heart sounds: Normal heart sounds. No murmur. No friction rub. No gallop.   Pulmonary:     Effort: Pulmonary effort is normal. No respiratory distress.     Breath sounds: Normal breath sounds. No wheezing or rales.  Abdominal:     General: Bowel sounds are normal. There is no distension.     Palpations: Abdomen is soft. There is no mass.     Tenderness: There is abdominal tenderness in the right upper quadrant, epigastric area  and left upper quadrant. There is no guarding or rebound.    Lymphadenopathy:     Cervical: No cervical adenopathy.  Skin:    General: Skin is warm and dry.     Findings: No erythema or rash.  Neurological:     Mental Status: She is alert.     Coordination: Coordination normal.     Gait: Gait normal.  Psychiatric:        Behavior: Behavior normal.        Thought Content: Thought content normal.        Judgment: Judgment normal.     Lab Review:     Component Value Date/Time   NA 140 12/02/2018 0920   K 3.9 12/02/2018 0920   CL 104 12/02/2018 0920   CO2 24 12/02/2018 0920   GLUCOSE 90 12/02/2018 0920   BUN 7 12/02/2018 0920   CREATININE 0.57 12/02/2018 0920   CALCIUM 8.1 (L) 12/02/2018 0920   PROT 6.0 (L) 12/02/2018 0920   ALBUMIN 3.2 (L) 12/02/2018 0920   AST 50 (H) 12/02/2018 0920   ALT 37 12/02/2018 0920   ALKPHOS 129 (H) 12/02/2018 0920   BILITOT 0.5 12/02/2018 0920   GFRNONAA >60 12/02/2018 0920   GFRAA >60 12/02/2018 0920       Component Value Date/Time   WBC 5.4 12/02/2018 0920   WBC 3.2 (L) 11/07/2018 0946   RBC 5.13 (H) 12/02/2018 0920   HGB 10.7 (L) 12/02/2018 0920   HCT 32.4 (L) 12/02/2018 0920   PLT 209 12/02/2018 0920   MCV 63.2 (L) 12/02/2018 0920   MCH 20.9 (L) 12/02/2018 0920   MCHC 33.0 12/02/2018 0920   RDW 19.1 (H) 12/02/2018 0920   LYMPHSABS 0.4  (L) 12/02/2018 0920   MONOABS 0.4 12/02/2018 0920   EOSABS 0.0 12/02/2018 0920   BASOSABS 0.0 12/02/2018 0920   -------------------------------  Imaging from last 24 hours (if applicable):  Radiology interpretation: Ct Abdomen Pelvis W Contrast  Result Date: 11/07/2018 CLINICAL DATA:  Abdominal pain for 2 days with nausea vomiting EXAM: CT ABDOMEN AND PELVIS WITH CONTRAST TECHNIQUE: Multidetector CT imaging of the abdomen and pelvis was performed using the standard protocol following bolus administration of intravenous contrast. CONTRAST:  3m OMNIPAQUE IOHEXOL 300 MG/ML  SOLN COMPARISON:  October 20, 2018 FINDINGS: Lower chest: The visualized heart size within normal limits. No pericardial fluid/thickening. No hiatal hernia. The visualized portions of the lungs are clear. Hepatobiliary: Again noted is diffuse low density seen throughout the liver parenchyma with heterogeneous enhancement in the posterior right liver lobe.The main portal vein is patent. The patient is status post cholecystectomy. No biliary ductal dilation. Pancreas: There is atrophy within the mid body and tail of the pancreas with a prior whipple procedure. Spleen: Normal in size without focal abnormality. Adrenals/Urinary Tract: Both adrenal glands appear normal. The kidneys and collecting system appear normal without evidence of urinary tract calculus or hydronephrosis. Bladder is unremarkable. Stomach/Bowel: The stomach is grossly unremarkable. There remains diffuse bowel wall thickening and edema within the proximal transverse colon and hepatic flexure. There is also question of mild wall thickening seen in the sigmoid colon. This appears to be improved since the prior exam. There is a moderate to large amount of colonic stool present.The appendix is normal. Vascular/Lymphatic: There are no enlarged mesenteric, retroperitoneal, or pelvic lymph nodes. Scattered aortic atherosclerotic calcifications are seen without aneurysmal  dilatation. Reproductive: The uterus and adnexa are unremarkable. Other: No evidence of abdominal wall mass or hernia. Musculoskeletal: No  acute or significant osseous findings. IMPRESSION: 1. There remains edematous appearance to the proximal transverse colon and hepatic flexure as well as possibly the sigmoid colon. Findings may be infectious or inflammatory. This however overall appears to be improved since the prior exam of October 20, 2018. 2. Moderate to large amount of colonic stool. 3. Hepatic steatosis with heterogeneous enhancement. Electronically Signed   By: Prudencio Pair M.D.   On: 11/07/2018 01:16        This case was discussed with Dr. Burr Medico. She expressed agreement with my management of this patient.

## 2018-12-04 ENCOUNTER — Telehealth: Payer: Self-pay | Admitting: Nurse Practitioner

## 2018-12-04 NOTE — Telephone Encounter (Signed)
No los per 11/17.

## 2018-12-05 ENCOUNTER — Inpatient Hospital Stay (HOSPITAL_BASED_OUTPATIENT_CLINIC_OR_DEPARTMENT_OTHER): Payer: Medicaid Other | Admitting: Medical

## 2018-12-05 ENCOUNTER — Other Ambulatory Visit: Payer: Self-pay

## 2018-12-05 ENCOUNTER — Inpatient Hospital Stay: Payer: Medicaid Other

## 2018-12-05 VITALS — BP 94/77 | HR 86 | Temp 98.0°F | Resp 17 | Wt 89.4 lb

## 2018-12-05 DIAGNOSIS — C250 Malignant neoplasm of head of pancreas: Secondary | ICD-10-CM

## 2018-12-05 MED ORDER — HEPARIN SOD (PORK) LOCK FLUSH 100 UNIT/ML IV SOLN
500.0000 [IU] | Freq: Once | INTRAVENOUS | Status: DC | PRN
  Filled 2018-12-05: qty 5

## 2018-12-05 MED ORDER — SODIUM CHLORIDE 0.9 % IV SOLN
Freq: Once | INTRAVENOUS | Status: DC
  Filled 2018-12-05: qty 250

## 2018-12-05 MED ORDER — SODIUM CHLORIDE 0.9% FLUSH
10.0000 mL | Freq: Once | INTRAVENOUS | Status: DC | PRN
  Filled 2018-12-05: qty 10

## 2018-12-05 NOTE — Patient Instructions (Signed)
COVID-19: How to Protect Yourself and Others Know how it spreads  There is currently no vaccine to prevent coronavirus disease 2019 (COVID-19).  The best way to prevent illness is to avoid being exposed to this virus.  The virus is thought to spread mainly from person-to-person. ? Between people who are in close contact with one another (within about 6 feet). ? Through respiratory droplets produced when an infected person coughs, sneezes or talks. ? These droplets can land in the mouths or noses of people who are nearby or possibly be inhaled into the lungs. ? Some recent studies have suggested that COVID-19 may be spread by people who are not showing symptoms. Everyone should Clean your hands often  Wash your hands often with soap and water for at least 20 seconds especially after you have been in a public place, or after blowing your nose, coughing, or sneezing.  If soap and water are not readily available, use a hand sanitizer that contains at least 60% alcohol. Cover all surfaces of your hands and rub them together until they feel dry.  Avoid touching your eyes, nose, and mouth with unwashed hands. Avoid close contact  Stay home if you are sick.  Avoid close contact with people who are sick.  Put distance between yourself and other people. ? Remember that some people without symptoms may be able to spread virus. ? This is especially important for people who are at higher risk of getting very [employer] Cover your mouth and nose with a cloth face cover when around others  You could spread COVID-19 to others even if you do not feel sick.  Everyone should wear a cloth face cover when they have to go out in public, for example to the grocery store or to pick up other necessities. ? Cloth face coverings should not be placed on young children under age 25, anyone who has trouble breathing, or is unconscious,  incapacitated or otherwise unable to remove the mask without assistance.  The cloth face cover is meant to protect other people in case you are infected.  Do NOT use a facemask meant for a Dietitian.  Continue to keep about 6 feet between yourself and others. The cloth face cover is not a substitute for social distancing. Cover coughs and sneezes  If you are in a private setting and do not have on your cloth face covering, remember to always cover your mouth and nose with a tissue when you cough or sneeze or use the inside of your elbow.  Throw used tissues in the trash.  Immediately wash your hands with soap and water for at least 20 seconds. If soap and water are not readily available, clean your hands with a hand sanitizer that contains at least 60% alcohol. Clean and disinfect  Clean AND disinfect frequently touched surfaces daily. This includes tables, doorknobs, light switches, countertops, handles, desks, phones, keyboards, toilets, faucets, and sinks. [employer]  If surfaces are dirty, clean them: Use detergent or soap and water prior to disinfection.  Then, use a household disinfectant. You can see a list of EPA-registered household disinfectants here. [employer]hellinders.com 05/21/2018 This information is not intended to replace advice given to you by your health care provider. Make sure you discuss any questions you have with your health care provider. Document Released: 04/30/2018 Document Revised: 05/29/2018 Document Reviewed: 04/30/2018 Elsevier Patient Education  [place].

## 2018-12-05 NOTE — Progress Notes (Signed)
[place]   [phone] [phone]   Clinic Follow up Note   Patient Care Team: Patient, No Pcp Per as PCP - General ([place]) Milus Banister, MD as Attending Physician (Gastroenterology) Truitt Merle, MD as Consulting Physician (Hematology)  Date of Service:  12/10/2018  CHIEF COMPLAINT: F/u on pancreatic cancer  SUMMARY OF ONCOLOGIC HISTORY: Oncology History Overview Note  Cancer Staging Pancreatic cancer [facility]) Staging form: Exocrine Pancreas, AJCC 8th Edition - Clinical stage from 11/08/2017: Stage IB (cT2, cN0, cM0) - Signed by Truitt Merle, MD on 11/09/2017     Malignant neoplasm of head of pancreas ([place])  10/30/2017 Imaging   MRI Abdomen 10/30/17  IMPRESSION: 1. Marked intra and extrahepatic biliary duct dilatation with abrupt cut off of the common bile duct at the pancreatic head. This is associated with marked dilatation of the main pancreatic duct also with an abrupt cut off at the level of the pancreatic head. Pancreatic parenchyma in the body and tail of pancreas is markedly atrophic. A subtle 2.5 x 3 cm lesion with differential signal intensity is identified in the head of the pancreas which appears slightly hypoenhancing on postcontrast imaging. Together, these features are highly concerning for pancreatic adenocarcinoma. EUS is recommended to further evaluate. 2. Portal vein is patent although there is some mass-effect on the portal splenic confluence and IVC. Fat planes around celiac axis and SMA appear preserved. 3. Upper normal to borderline portal caval lymph node associated with small right para-aortic lymphadenopathy.     10/30/2017 Tumor Marker   Baseline Ca 19-9 at 140   10/31/2017 Procedure   ERCP by Dr. Lyndel Safe 10/31/17  IMPRESSION Malignant appearing CBD stricture (due to pancreatic mass) status post sphincterotomy and stent insertion.   11/08/2017 Cancer Staging   Staging form: Exocrine Pancreas,  AJCC 8th Edition - Clinical stage from 11/08/2017: Stage IB (cT2, cN0, cM0) - Signed by Truitt Merle, MD on 11/09/2017   11/08/2017 Procedure   EUS and EGD by Dr. Ardis Hughs 11/08/17  IMPRESSION -Very vague, irregularly bordered, approximately 2.6cm mass was found in the head of pancreas. This causes biliary and pancreatic duct obstruction. The previously plastic biliary stent is in good position. The mass directly abuts and attenuates the PV/SMV confluence, suggesting invasion. FNA performed and the preliminary cytology review is positive for maligancy (adenocarcinoma). - [place] [facility] office will arrange referrals to medical and surgical oncology. - Case to be discussed at upcoming multidisciplinary GI tumor conference.   11/08/2017 Initial Biopsy   Biopsy Cytology 11/08/17 Diagnosis FINE NEEDLE ASPIRATION, ENDOSCOPIC, PANCREAS HEAD (SPECIMEN 1 OF 1 COLLECTED 11/08/17): MALIGNANT CELLS CONSISTENT WITH ADENOCARCINOMA.   11/09/2017 Initial Diagnosis   Pancreatic cancer ([place])   11/15/2017 Imaging   CT Chest and Pelvis WO Contrast 11/15/17 IMPRESSION: 1. No findings of active malignancy in the chest or pelvis. 2. Aortic Atherosclerosis (ICD10-I70.0). Coronary atherosclerosis. 3. Faint centrilobular nodularity in the lung apices, raising the possibility of hypersensitivity pneumonitis or less likely respiratory bronchiolitis. Airway thickening is present, suggesting bronchitis or reactive airways disease.   11/19/2017 -  Chemotherapy   FOLFIRINOX q2weeks with Neulasta injection for 4 months starting 11/19/17. Completed 8 cycles on 03/07/18    02/04/2018 Imaging   CT AP W Contrast 02/04/18  IMPRESSION: 1. No substantial change in size of mass involving head of pancreas. A aortocaval lymph node which appears increased in the interval measuring 1.1 cm versus 0.8 cm previously. Stable 1.2 cm peripancreatic node. 2. Dilated proximal common bile duct is  mildly increased in caliber compared with  12/12/2017. The common bile duct stent remains in place in appears to be in appropriate position.    04/19/2018 Imaging   CT AP 04/19/18  IMPRESSION: 1. Essentially stable exam compared to most recent CT scans of 02/04/2018 and 12/12/2017. 2. Stable mass in the head of the pancreas with duct dilatation and atrophy upstream. Mass continues to surround the GDA and contacts the portal vein but is free of the celiac trunk and SMA. Mass probably contacts the RIGHT hepatic artery at the takeoff of the GDA (image 56/5). Dominant LEFT hepatic artery originates from the LEFT gastric. 3. Mild intrahepatic and extrahepatic duct dilatation similar to prior with flexible stent in the common bile duct. 4. No new or progressive disease. 5. No peritoneal metastasis.   04/25/2018 Imaging   CT Angio chest 04/25/18  IMPRESSION: 1. No demonstrable pulmonary embolus. No thoracic aortic aneurysm or dissection. 2. Areas of atelectatic change bilaterally. No consolidation. No pneumothorax. Endotracheal tube tip is slightly superior to the carina. 3. Enlarged subcarinal lymph node, concerning for neoplastic etiology. 4. 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 Biliary stent present. Biliary duct air is felt to be secondary to the stent placement. 5. Mild upper abdominal ascites. Pneumoperitoneum with apparent drainage catheter present. The pneumoperitoneum is likely of post procedural/postoperative etiology. Bowel perforation in this circumstance cannot be excluded entirely, however. Close clinical assessment in this regard advised. 6.  Left ventricular hypertrophy.   04/25/2018 Surgery   LAPAROSCOPY DIAGNOSTIC and WHIPPLE PROCEDURE, RECONSTRUCTION OF PORTAL VEIN by Dr. Barry Dienes, Dr. Marcello Moores 04/25/18    04/25/2018 Pathology Results    Diagnosis 04/25/18 1. Whipple procedure/resection, with gallbladder with segment of portal vein - INVASIVE ADENOCARCINOMA, MODERATELY DIFFERENTIATED, SPANNING  3.5 CM. - ADENOCARCINOMA IS PRESENT AT THE SUPERIOR MARGIN OF THE ATTACHED VEIN AND THE INFERIOR MARGIN OF THE ATTACHED VEIN (BLOCKS E AND F). - ADENOCARCINOMA EXTENDS INTO PERIPANCREATIC SOFT TISSUE. - PERINEURAL INVASION IS IDENTIFIED, DIFFUSE. - METASTATIC ADENOCARCINOMA IN 1 OF 24 LYMPH NODES (1/24). - SEE ONCOLOGY TABLE BELOW. 2. Lymph node, biopsy, portal - THERE IS NO EVIDENCE OF CARCINOMA IN 5 OF 5 LYMPH NODES (0/5). 3. Pancreas, biopsy, additional pancreatic margin - HIGH GRADE GLANDULAR DYSPLASIA, FOCAL    05/09/2018 Imaging   CT AP W Contrast 05/09/18 IMPRESSION: Status post Whipple's procedure. 1 surgical drain remains in the area. Pancreatic duct stent is noted.   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 It appears to be stable in size compared to prior exam.   Wall and fold thickening is seen involving right and transverse colon which may represent edema or possibly inflammation.   Aortic Atherosclerosis (ICD10-I70.0).   06/15/2018 Imaging   CT AP W Contrast 06/15/18 IMPRESSION: Changes of prior Whipple procedure. Previously seen pancreatic duct stent presumably has migrated across the choledocho-jejunostomy and is now located in the biliary ducts. No biliary or pancreatic ductal dilatation.   Severe diffuse low-density throughout the liver, likely fatty infiltration.   Wall thickening within the ascending colon and hepatic flexure concerning for colitis.   Small amount of free fluid in the pelvis.   Aortic atherosclerosis.   07/25/2018 PET scan   PET 07/25/18  IMPRESSION: 1. Right hepatic lobe hypermetabolism, in the setting of heterogeneous hepatic steatosis. Finding is highly suspicious for isolated hepatic metastasis. 2. More equivocal lower level hypermetabolism along the  medial hepatic capsule and within the hepatic dome. These areas could be  postoperative and physiologic respectively. Recommend attention on follow-up. 3. Coronary artery atherosclerosis. Aortic Atherosclerosis (ICD10-I70.0).   08/13/2018 Pathology Results   Diagnosis 08/13/18 Liver, needle/core biopsy, right - LIVER PARENCHYMA WITH A BILE EXTRAVASATION AND HISTIOCYTES - SEE COMMENT   08/21/2018 Imaging   CT AP W Contrast  IMPRESSION: 1. Diffuse colonic wall thickening and submucosal edema suggestive of colitis, findings are similar to prior study. 2. Interval improvement in the degree of gastric wall thickening and edema compared to prior CT. 3. Small volume ascites, unchanged. 4. Diffuse hepatic steatosis with grossly stable area of non masslike enhancement in the right hepatic lobe. Continued imaging surveillance of this finding is suggested.     10/09/2018 - 11/21/2018 Radiation Therapy   Adjuvant radiation with Dr. Lisbeth Renshaw 10/09/18-11/21/18   11/07/2018 Imaging   CT AP W Contrast  IMPRESSION: 1. There remains edematous appearance to the proximal transverse colon and hepatic flexure as well as possibly the sigmoid colon. Findings may be infectious or inflammatory. This however overall appears to be improved since the prior exam of October 20, 2018. 2. Moderate to large amount of colonic stool. 3. Hepatic steatosis with heterogeneous enhancement.        CURRENT THERAPY:  Supportive Care and Surveillance   INTERVAL HISTORY:  Jane Brooks is here for a follow up of pancreatic cancer. She presents to the clinic alone. She went to ED yesterday for colonitis pain again. She notes she did know she had appointments here this morning. She notes she had colonoscopy in 10/2018 and was clear. She feels her colonitis flares and she is not sure what triggers it. She notes when she left ED early this morning she felt better. She went home and took a nap but when she woke up her pain returned at high level again. She came to clinic to get more IVFs and pain  medications. She notes she eats, yogurt, fruit, dairy and seafood. She is not sure what foods could be triggering her flares.   She notes she was offered a chance to travel with friend for thanksgiving but it she does not feel well enough tomorrow she is not going.     REVIEW OF SYSTEMS:   Constitutional: Denies fevers, chills or abnormal weight loss (+) Fatigue  Eyes: Denies blurriness of vision Ears, nose, mouth, throat, and face: Denies mucositis or sore throat Respiratory: Denies cough, dyspnea or wheezes Cardiovascular: Denies palpitation, chest discomfort or lower extremity swelling Gastrointestinal:  Denies heartburn or change in bowel habits (+)abdominal pain (+) N&V Skin: Denies abnormal skin rashes Lymphatics: Denies new lymphadenopathy or easy bruising Neurological:Denies numbness, tingling or new weaknesses Behavioral/Psych: Mood is stable, no new changes  All other systems were reviewed with the patient and are negative.  MEDICAL HISTORY:  Past Medical History:  Diagnosis Date  . Family history of lung cancer   . Family history of prostate cancer   . Family history of uterine cancer   . Gallstones 10/2017  . Pancreatic cancer ([place])   . Pneumothorax, closed, traumatic    years ago  . PONV (postoperative nausea and vomiting)     SURGICAL HISTORY: Past Surgical History:  Procedure Laterality Date  . BILIARY STENT PLACEMENT  10/31/2017   Procedure: BILIARY STENT PLACEMENT;  Surgeon: [name], MD;  Location: [facility] ENDOSCOPY;  Service: Gastroenterology;;  . BIOPSY  10/23/2018   Procedure: BIOPSY;  Surgeon: [name], MD;  Location: [facility] ENDOSCOPY;  Service: Endoscopy;;  . COLONOSCOPY WITH PROPOFOL N/A 10/23/2018   Procedure: COLONOSCOPY WITH PROPOFOL;  Surgeon: [name], MD;  Location: [facility] ENDOSCOPY;  Service: Endoscopy;  Laterality: N/A;  . ERCP N/A 10/31/2017   Procedure: ENDOSCOPIC RETROGRADE CHOLANGIOPANCREATOGRAPHY (ERCP);  Surgeon: [name], MD;   Location: [facility] ENDOSCOPY;  Service: Gastroenterology;  Laterality: N/A;  . ESOPHAGOGASTRODUODENOSCOPY N/A 11/08/2017   Procedure: ESOPHAGOGASTRODUODENOSCOPY (EGD);  Surgeon: Milus Banister, MD;  Location: [facility] ENDOSCOPY;  Service: Endoscopy;  Laterality: N/A;  . EUS N/A 11/08/2017   Procedure: UPPER ENDOSCOPIC ULTRASOUND (EUS) RADIAL;  Surgeon: Milus Banister, MD;  Location: [facility] ENDOSCOPY;  Service: Endoscopy;  Laterality: N/A;  . FINE NEEDLE ASPIRATION  11/08/2017   Procedure: FINE NEEDLE ASPIRATION;  Surgeon: Milus Banister, MD;  Location: [facility] ENDOSCOPY;  Service: Endoscopy;;  . IR IMAGING GUIDED PORT INSERTION  11/15/2017  . LAPAROSCOPY N/A 04/25/2018   Procedure: LAPAROSCOPY DIAGNOSTIC;  Surgeon: Stark Klein, MD;  Location: [place];  Service: General;  Laterality: N/A;  GENERAL AND EPIDURAL ANESTHESIA  . SPHINCTEROTOMY  10/31/2017   Procedure: SPHINCTEROTOMY;  Surgeon: [name], MD;  Location: [facility] ENDOSCOPY;  Service: Gastroenterology;;  . TONSILLECTOMY    . WHIPPLE PROCEDURE N/A 04/25/2018   Procedure: WHIPPLE PROCEDURE, RECONSTRUCTION OF PORTAL VEIN;  Surgeon: Stark Klein, MD;  Location: [place];  Service: General;  Laterality: N/A;    I have reviewed the social history and family history with the patient and they are unchanged from previous note.  ALLERGIES:  is allergic to latex.  MEDICATIONS:  Current Outpatient Medications  Medication Sig Dispense Refill  . acetaminophen (TYLENOL) 325 MG tablet Take 2 tablets (650 mg total) by mouth every 6 (six) hours as needed for mild pain (or Fever >/= 101). 30 tablet 0  . cyclobenzaprine (FEXMID) 7.5 MG tablet Take 1 tablet (7.5 mg total) by mouth 3 (three) times daily as needed for muscle spasms. 30 tablet 0  . dicyclomine (BENTYL) 10 MG capsule Take 1 capsule (10 mg total) by mouth 3 (three) times daily between meals as needed for spasms. 30 capsule 0  . dronabinol (MARINOL) 5 MG capsule Take 1 capsule (5 mg total) by mouth 2 (two) times  daily before lunch and supper. 60 capsule 0  . gabapentin (NEURONTIN) 300 MG capsule Take 1 capsule (300 mg total) by mouth 3 (three) times daily. 90 capsule 1  . lipase/protease/amylase (CREON) 36000 UNITS CPEP capsule Take 36,000 Units by mouth 3 (three) times daily before meals.    Marland Kitchen morphine (MS CONTIN) 15 MG 12 hr tablet Take 1 tablet (15 mg total) by mouth every 12 (twelve) hours. 60 tablet 0  . morphine (MSIR) 15 MG tablet Take 1 tablet (15 mg total) by mouth every 4 (four) hours as needed for severe pain. 60 tablet 0  . nicotine (NICODERM CQ - DOSED IN MG/24 HR) 7 mg/24hr patch Place 1 patch (7 mg total) onto the skin daily. 28 patch 0  . ondansetron (ZOFRAN ODT) 4 MG disintegrating tablet Take 1 tablet (4 mg total) by mouth every 6 (six) hours as needed for nausea or vomiting. 20 tablet 0  . polyethylene glycol (MIRALAX / GLYCOLAX) 17 g packet Take 17 g by mouth daily. 14 each 0  . potassium chloride SA (KLOR-CON) 20 MEQ tablet Take 1 tablet (20 mEq total) by mouth daily. 30 tablet 1  . promethazine (PHENERGAN) 25 MG tablet Take 1 tablet (25 mg total) by mouth every 6 (six) hours as needed for  nausea or vomiting. 15 tablet 0  . scopolamine (TRANSDERM-SCOP) 1 MG/3DAYS Place 1 patch (1.5 mg total) onto the skin every 3 (three) days. 10 patch 3   No current facility-administered medications for this visit.    Facility-Administered Medications Ordered in Other Visits  Medication Dose Route Frequency Provider Last Rate Last Dose  . heparin lock flush 100 unit/mL  500 Units Intracatheter Once PRN Truitt Merle, MD      . sodium chloride flush (NS) 0.9 % injection 10 mL  10 mL Intracatheter Once PRN Truitt Merle, MD        PHYSICAL EXAMINATION: ECOG PERFORMANCE STATUS: 3 - Symptomatic, >50% confined to bed  Vitals:   12/10/18 1221  BP: 101/71  Pulse: 73  Resp: 18  Temp: 98.2 F (36.8 C)  SpO2: 100%   Filed Weights   12/10/18 1221  Weight: 90 lb 11.2 oz (41.1 kg)    GENERAL:alert, mild  distress due to pain  SKIN: skin color, texture, turgor are normal, no rashes or significant lesions EYES: normal, Conjunctiva are pink and non-injected, sclera clear  NECK: supple, thyroid normal size, non-tender, without nodularity LYMPH:  no palpable lymphadenopathy in the cervical, axillary  LUNGS: clear to auscultation and percussion with normal breathing effort HEART: regular rate & rhythm and no murmurs and no lower extremity edema ABDOMEN:abdomen soft, non-tender and normal bowel sounds (+) Diffuse abdominal tenderness   Musculoskeletal:no cyanosis of digits and no clubbing  NEURO: alert & oriented x 3 with fluent speech, no focal motor/sensory deficits  LABORATORY DATA:  I have reviewed the data as listed CBC Latest Ref Rng & Units 12/10/2018 12/02/2018 11/13/2018  WBC 4.0 - 10.5 K/uL 3.9(L) 5.4 6.9  Hemoglobin 12.0 - 15.0 g/dL 10.1(L) 10.7(L) 10.5(L)  Hematocrit 36.0 - 46.0 % 30.4(L) 32.4(L) 32.2(L)  Platelets 150 - 400 K/uL 152 209 214     CMP Latest Ref Rng & Units 12/10/2018 12/02/2018 11/13/2018  Glucose 70 - 99 mg/dL 81 90 100(H)  BUN 6 - 20 mg/dL 11 7 7   Creatinine 0.44 - 1.00 mg/dL 0.38(L) 0.57 0.62  Sodium 135 - 145 mmol/L 137 140 139  Potassium 3.5 - 5.1 mmol/L 3.3(L) 3.9 4.0  Chloride 98 - 111 mmol/L 103 104 102  CO2 22 - 32 mmol/L 25 24 26   Calcium 8.9 - 10.3 mg/dL 8.3(L) 8.1(L) 8.6(L)  Total Protein 6.5 - 8.1 g/dL 5.5(L) 6.0(L) 6.5  Total Bilirubin 0.3 - 1.2 mg/dL 0.7 0.5 0.6  Alkaline Phos 38 - 126 U/L 139(H) 129(H) 103  AST 15 - 41 U/L 70(H) 50(H) 37  ALT 0 - 44 U/L 50(H) 37 27      RADIOGRAPHIC STUDIES: I have personally reviewed the radiological images as listed and agreed with the findings in the report. No results found.   ASSESSMENT & PLAN:  Jane Brooks is a 48 y.o. female with    1.Abdominal pain, n/v, low weight  -since whipple surgery she has been repeated hospitalized or seen at ED due to abdominal pain, N&V and diarrhea.  -She  has received IVFs as needed from clinic  -For nausea she will try phenergan suppository if needed. Scopolamine patch and zofran have been utilized in the interval with moderate help.  -For persistent nausea and anorexia with weight loss, she has Marinol 5 mg BID. She does not tolerate nutrition supplements very well.  -For pain and spasm she will continue MS contin BID, MSIR q6hours only as needed, and Bentyl.  -Her 10/2018 Colonoscopy only  showed mild diverticulosis, overall normal.  -To better manage her symptoms, I again discussed consistently coming for weekly IVFs even if she feels adequate instead of waiting until symptoms unbearable and going to ED. If she needs to come in sooner she should contact clinic beforehand. She voices good understanding. She has previously cancelled multiple scheduled IVF appointments  -Will give IVFs today and refilled her pain medication today.  -Will continue weekly IVFs for next 3 weeks   2. 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 -Her postoprecovery has been difficult withrecurrentabdominal pain, nausea, vomiting and diarrhea, multiple ED visits and hospitalization.  -I have been concerned about her pancreatic cancer recurrence, especially with peritoneal metastasis, which can cause abdominal pain, nausea, and colitis like CT appearing. However several images including PET on 07/25/2018 and liver biopsy on 08/13/2018 have not confirmed cancer recurrence. IRDr. Chestine Spore they got it and did not recommend a rebiopsy. I recommend following up withclose scan monitoring.  -Due to her positive surgical margins, she completed radiation with Dr. Lisbeth Renshaw 10/09/18-11/21/18.   -Her recurrent abdominal  pain, N&V continues to persists. Her 10/23/2018 Colonoscopy shows mild diverticulosis but otherwise normal. However her Ca 19.9 continues to trend up slowly. I recommend another PET scan in 1-2 weeks to evaluate again. She is agreeable.    3. Social and [place] moved into her new apartment. -She now has her puppy and doing well with him.  4. 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 Genetics: Genetic testhasbeen held due to lack of insurance.  6. Smoking Cessation  -She is currently down to smoking 3/4 ppd -She has been using nicotine patch. She is working to quit completely.    Plan -I refilled MS Contin and short acting morphine today, changed morphine IR to q4h PRN #60, so she can get refill today, she is going out of town tomorrow. She knows that I will not refill sooner than 2 weeks  -Proceed with IVFs today, 2L over 1.5h, alone with zofran, dexa and vi morphine  -I encouraged her to use Mychart, she agrees to be compliant with her appointments  -Lab, flush and IVFs 2 hr weekly X3 -f/u in 2 weeks  -PET scan 1-2 weeks    No problem-specific Assessment & Plan notes found for this encounter.   Orders Placed This Encounter  Procedures  . NM PET Image Restag (PS) Skull Base To Thigh    Standing Status:   Future    Standing Expiration Date:   12/10/2019    Order Specific Question:   If indicated for the ordered procedure, I authorize the administration of a radiopharmaceutical per Radiology protocol    Answer:   Yes    Order Specific Question:   Is the patient pregnant?    Answer:   No    Order Specific Question:   Preferred imaging location?    Answer:   [facility]    Order Specific Question:   Radiology Contrast Protocol - do NOT remove file path    Answer:   [email or web address]   All questions were answered. The patient knows to call the clinic with any  problems, questions or concerns. No barriers to learning was detected. I spent 20 minutes counseling  the patient face to face. The total time spent in the appointment was 25 minutes and more than 50% was on counseling and review of test results     Truitt Merle, MD 12/10/2018   I, Joslyn Devon, am acting as scribe for Truitt Merle, MD.   I have reviewed the above documentation for accuracy and completeness, and I agree with the above.

## 2018-12-05 NOTE — Progress Notes (Signed)
Patient was seen earlier this week when she was having nausea, vomiting, and abdominal pain along with anorexia.  She was given IV fluids, antiemetics, and IV Dilaudid.  She was scheduled to be seen today for additional IV fluids however she reports that she is feeling significantly better.  She is eating better, has no nausea or vomiting, and has better control of her abdominal pain.  She was told to return as needed.  No other intervention was indicated today.  Sandi Mealy, MHS, PA-C Physician Assistant

## 2018-12-05 NOTE — Progress Notes (Signed)
Pt states that she is feeling much better today and does not believe she needs IVF.  Pt states she ate three meals yesterday and that her energy has improved, denies any CP/SOB, fever/chills, N/V/D, or pain.  PA Lucianne Lei assessed, gave ok for pt to leave today without IVF.  LPN Porsche made aware to alert NP Lacie of visit outcome today.

## 2018-12-10 ENCOUNTER — Inpatient Hospital Stay: Payer: Medicaid Other

## 2018-12-10 ENCOUNTER — Emergency Department (HOSPITAL_COMMUNITY)
Admission: EM | Admit: 2018-12-10 | Discharge: 2018-12-10 | Disposition: A | Discharge status: Home / Self Care | Payer: Medicaid Other | Attending: Emergency Medicine | Admitting: Emergency Medicine

## 2018-12-10 ENCOUNTER — Encounter: Payer: Self-pay | Admitting: Hematology

## 2018-12-10 ENCOUNTER — Telehealth: Payer: Self-pay | Admitting: Emergency Medicine

## 2018-12-10 ENCOUNTER — Inpatient Hospital Stay (HOSPITAL_BASED_OUTPATIENT_CLINIC_OR_DEPARTMENT_OTHER): Payer: Medicaid Other | Admitting: Hematology

## 2018-12-10 ENCOUNTER — Encounter (HOSPITAL_COMMUNITY): Payer: Self-pay

## 2018-12-10 ENCOUNTER — Other Ambulatory Visit: Payer: Self-pay

## 2018-12-10 VITALS — BP 101/71 | HR 73 | Temp 98.2°F | Resp 18 | Wt 90.7 lb

## 2018-12-10 DIAGNOSIS — Z91040 Latex allergy status: Secondary | ICD-10-CM | POA: Insufficient documentation

## 2018-12-10 DIAGNOSIS — C259 Malignant neoplasm of pancreas, unspecified: Secondary | ICD-10-CM | POA: Diagnosis not present

## 2018-12-10 DIAGNOSIS — I10 Essential (primary) hypertension: Secondary | ICD-10-CM | POA: Diagnosis not present

## 2018-12-10 DIAGNOSIS — R109 Unspecified abdominal pain: Secondary | ICD-10-CM

## 2018-12-10 DIAGNOSIS — R112 Nausea with vomiting, unspecified: Secondary | ICD-10-CM | POA: Diagnosis not present

## 2018-12-10 DIAGNOSIS — R1084 Generalized abdominal pain: Secondary | ICD-10-CM | POA: Insufficient documentation

## 2018-12-10 DIAGNOSIS — C250 Malignant neoplasm of head of pancreas: Secondary | ICD-10-CM

## 2018-12-10 DIAGNOSIS — F17210 Nicotine dependence, cigarettes, uncomplicated: Secondary | ICD-10-CM | POA: Insufficient documentation

## 2018-12-10 DIAGNOSIS — G8929 Other chronic pain: Secondary | ICD-10-CM | POA: Diagnosis not present

## 2018-12-10 LAB — CBC WITH DIFFERENTIAL/PLATELET
Abs Immature Granulocytes: 0.04 10*3/uL (ref 0.00–0.07)
Basophils Absolute: 0 10*3/uL (ref 0.0–0.1)
Basophils Relative: 0 %
Eosinophils Absolute: 0 10*3/uL (ref 0.0–0.5)
Eosinophils Relative: 0 %
HCT: 30.4 % — ABNORMAL LOW (ref 36.0–46.0)
Hemoglobin: 10.1 g/dL — ABNORMAL LOW (ref 12.0–15.0)
Immature Granulocytes: 1 %
Lymphocytes Relative: 7 %
Lymphs Abs: 0.3 10*3/uL — ABNORMAL LOW (ref 0.7–4.0)
MCH: 21.3 pg — ABNORMAL LOW (ref 26.0–34.0)
MCHC: 33.2 g/dL (ref 30.0–36.0)
MCV: 64 fL — ABNORMAL LOW (ref 80.0–100.0)
Monocytes Absolute: 0.3 10*3/uL (ref 0.1–1.0)
Monocytes Relative: 8 %
Neutro Abs: 3.3 10*3/uL (ref 1.7–7.7)
Neutrophils Relative %: 84 %
Platelets: 152 10*3/uL (ref 150–400)
RBC: 4.75 MIL/uL (ref 3.87–5.11)
RDW: 19.1 % — ABNORMAL HIGH (ref 11.5–15.5)
WBC: 3.9 10*3/uL — ABNORMAL LOW (ref 4.0–10.5)
nRBC: 0 % (ref 0.0–0.2)

## 2018-12-10 LAB — COMPREHENSIVE METABOLIC PANEL
ALT: 50 U/L — ABNORMAL HIGH (ref 0–44)
AST: 70 U/L — ABNORMAL HIGH (ref 15–41)
Albumin: 2.8 g/dL — ABNORMAL LOW (ref 3.5–5.0)
Alkaline Phosphatase: 139 U/L — ABNORMAL HIGH (ref 38–126)
Anion gap: 9 (ref 5–15)
BUN: 11 mg/dL (ref 6–20)
CO2: 25 mmol/L (ref 22–32)
Calcium: 8.3 mg/dL — ABNORMAL LOW (ref 8.9–10.3)
Chloride: 103 mmol/L (ref 98–111)
Creatinine, Ser: 0.38 mg/dL — ABNORMAL LOW (ref 0.44–1.00)
GFR calc Af Amer: >60 mL/min (ref >60)
GFR calc non Af Amer: >60 mL/min (ref >60)
Glucose, Bld: 81 mg/dL (ref 70–99)
Potassium: 3.3 mmol/L — ABNORMAL LOW (ref 3.5–5.1)
Sodium: 137 mmol/L (ref 135–145)
Total Bilirubin: 0.7 mg/dL (ref 0.3–1.2)
Total Protein: 5.5 g/dL — ABNORMAL LOW (ref 6.5–8.1)

## 2018-12-10 LAB — LIPASE, BLOOD: Lipase: 15 U/L (ref 11–51)

## 2018-12-10 MED ORDER — HEPARIN SOD (PORK) LOCK FLUSH 100 UNIT/ML IV SOLN
500.0000 [IU] | Freq: Once | INTRAVENOUS | Status: AC | PRN
  Administered 2018-12-10: 500 [IU]
  Filled 2018-12-10: qty 5

## 2018-12-10 MED ORDER — DEXAMETHASONE SODIUM PHOSPHATE 10 MG/ML IJ SOLN
8.0000 mg | Freq: Once | INTRAMUSCULAR | Status: AC
  Administered 2018-12-10: 8 mg via INTRAVENOUS

## 2018-12-10 MED ORDER — MORPHINE SULFATE 15 MG PO TABS: 15.0000 mg | ORAL_TABLET | ORAL | 0 refills | Status: DC | PRN

## 2018-12-10 MED ORDER — SODIUM CHLORIDE 0.9 % IV SOLN: Freq: Once | INTRAVENOUS | Status: DC

## 2018-12-10 MED ORDER — SODIUM CHLORIDE 0.9% FLUSH
10.0000 mL | Freq: Once | INTRAVENOUS | Status: AC | PRN
  Administered 2018-12-10: 10 mL
  Filled 2018-12-10: qty 10

## 2018-12-10 MED ORDER — SODIUM CHLORIDE 0.9 % IV SOLN
Freq: Once | INTRAVENOUS | Status: AC
  Administered 2018-12-10: 13:00:00 via INTRAVENOUS
  Filled 2018-12-10: qty 250

## 2018-12-10 MED ORDER — HYDROMORPHONE HCL 1 MG/ML IJ SOLN
INTRAMUSCULAR | Status: AC
  Filled 2018-12-10: qty 1

## 2018-12-10 MED ORDER — PROMETHAZINE HCL 25 MG PO TABS: 25.0000 mg | ORAL_TABLET | Freq: Four times a day (QID) | ORAL | 0 refills | Status: AC | PRN

## 2018-12-10 MED ORDER — HYDROMORPHONE HCL 1 MG/ML IJ SOLN
1.0000 mg | Freq: Once | INTRAMUSCULAR | Status: AC
  Administered 2018-12-10: 1 mg via INTRAVENOUS
  Filled 2018-12-10: qty 1

## 2018-12-10 MED ORDER — MORPHINE SULFATE (PF) 4 MG/ML IV SOLN
INTRAVENOUS | Status: AC
  Filled 2018-12-10: qty 1

## 2018-12-10 MED ORDER — SODIUM CHLORIDE 0.9 % IV BOLUS (SEPSIS)
1000.0000 mL | Freq: Once | INTRAVENOUS | Status: AC
  Administered 2018-12-10: 04:00:00 1000 mL via INTRAVENOUS

## 2018-12-10 MED ORDER — DEXAMETHASONE SODIUM PHOSPHATE 10 MG/ML IJ SOLN
INTRAMUSCULAR | Status: AC
  Filled 2018-12-10: qty 1

## 2018-12-10 MED ORDER — ONDANSETRON HCL 4 MG/2ML IJ SOLN
4.0000 mg | Freq: Once | INTRAMUSCULAR | Status: AC
  Administered 2018-12-10: 4 mg via INTRAVENOUS
  Filled 2018-12-10: qty 2

## 2018-12-10 MED ORDER — ONDANSETRON HCL 4 MG/2ML IJ SOLN
INTRAMUSCULAR | Status: AC
  Filled 2018-12-10: qty 4

## 2018-12-10 MED ORDER — HEPARIN SOD (PORK) LOCK FLUSH 100 UNIT/ML IV SOLN
500.0000 [IU] | Freq: Once | INTRAVENOUS | Status: AC
  Administered 2018-12-10: 500 [IU]

## 2018-12-10 MED ORDER — ONDANSETRON 4 MG PO TBDP: 4.0000 mg | ORAL_TABLET | Freq: Four times a day (QID) | ORAL | 0 refills | Status: AC | PRN

## 2018-12-10 MED ORDER — HEPARIN SOD (PORK) LOCK FLUSH 100 UNIT/ML IV SOLN
INTRAVENOUS | Status: AC
  Filled 2018-12-10: qty 5

## 2018-12-10 MED ORDER — POTASSIUM CHLORIDE CRYS ER 20 MEQ PO TBCR
40.0000 meq | EXTENDED_RELEASE_TABLET | Freq: Once | ORAL | Status: AC
  Administered 2018-12-10: 40 meq via ORAL
  Filled 2018-12-10: qty 2

## 2018-12-10 MED ORDER — ONDANSETRON HCL 4 MG/2ML IJ SOLN
8.0000 mg | Freq: Once | INTRAMUSCULAR | Status: AC
  Administered 2018-12-10: 8 mg via INTRAVENOUS

## 2018-12-10 MED ORDER — MORPHINE SULFATE ER 15 MG PO TBCR: 15.0000 mg | EXTENDED_RELEASE_TABLET | Freq: Two times a day (BID) | ORAL | 0 refills | Status: DC

## 2018-12-10 MED ORDER — HYDROMORPHONE HCL 2 MG/ML IJ SOLN
INTRAMUSCULAR | Status: AC
  Filled 2018-12-10: qty 1

## 2018-12-10 MED ORDER — MORPHINE SULFATE 4 MG/ML IJ SOLN
4.0000 mg | Freq: Once | INTRAMUSCULAR | Status: AC
  Administered 2018-12-10: 4 mg via INTRAVENOUS
  Filled 2018-12-10: qty 1

## 2018-12-10 MED FILL — MORPHINE SULFATE IR 15 MG T: 15 | 10 days supply | Qty: 60 | Fill #0

## 2018-12-10 MED FILL — MORPHINE SULF ER 15 MG TAB: 15 | 30 days supply | Qty: 60 | Fill #0

## 2018-12-10 NOTE — ED Provider Notes (Signed)
TIME SEEN: 3:59 AM  CHIEF COMPLAINT: Chronic abdominal pain, vomiting and diarrhea  HPI: Patient is a 48 year old female with history of pancreatic cancer status post Whipple, recurrent colitis who presents to the emergency department with diffuse abdominal pain that she describes as similar to her chronic abdominal pain, nausea and vomiting with diarrhea that started yesterday.  Takes morphine at home and reports unable to keep it down.  Is here in the ED frequently for the same.  Last C. difficile and stool studies were negative on 10/21/2018.  Last CT abdomen pelvis on 11/07/2018 shows edematous appearance of the proximal transverse colon and hepatic flexure as well as the sigmoid colon that were similar to previous imaging on October 20, 2018.  Large amount of stool seen.  No fevers, chills, cough, chest pain, shortness of breath, dysuria, hematuria, vaginal bleeding or discharge.  No Covid exposures.  ROS: See HPI Constitutional: no fever  Eyes: no drainage  ENT: no runny nose   Cardiovascular:  no chest pain  Resp: no SOB  GI: Vomiting and diarrhea GU: no dysuria Integumentary: no rash  Allergy: no hives  Musculoskeletal: no leg swelling  Neurological: no slurred speech ROS otherwise negative  PAST MEDICAL HISTORY/PAST SURGICAL HISTORY:  Past Medical History:  Diagnosis Date  . Family history of lung cancer   . Family history of prostate cancer   . Family history of uterine cancer   . Gallstones 10/2017  . Pancreatic cancer ([place])   . Pneumothorax, closed, traumatic    years ago  . PONV (postoperative nausea and vomiting)     MEDICATIONS:  Prior to Admission medications   Medication Sig Start Date End Date Taking? Authorizing Provider  acetaminophen (TYLENOL) 325 MG tablet Take 2 tablets (650 mg total) by mouth every 6 (six) hours as needed for mild pain (or Fever >/= 101). 11/08/18   Swayze, Ava, DO  cyclobenzaprine (FEXMID) 7.5 MG tablet Take 1 tablet (7.5 mg total) by  mouth 3 (three) times daily as needed for muscle spasms. 10/11/18   Truitt Merle, MD  dicyclomine (BENTYL) 10 MG capsule Take 1 capsule (10 mg total) by mouth 3 (three) times daily between meals as needed for spasms. 11/13/18   Alla Feeling, NP  diphenoxylate-atropine (LOMOTIL) 2.5-0.025 MG tablet Take 1 to 2 tablets PO QID prn diarrhea Patient not taking: Reported on 10/22/2018 08/22/18   Harle Stanford., PA-C  dronabinol (MARINOL) 5 MG capsule Take 1 capsule (5 mg total) by mouth 2 (two) times daily before lunch and supper. 12/03/18   Alla Feeling, NP  gabapentin (NEURONTIN) 300 MG capsule Take 1 capsule (300 mg total) by mouth 3 (three) times daily. 11/21/18   Alla Feeling, NP  lipase/protease/amylase (CREON) 36000 UNITS CPEP capsule Take 36,000 Units by mouth 3 (three) times daily before meals.    [provider]  morphine (MS CONTIN) 15 MG 12 hr tablet Take 1 tablet (15 mg total) by mouth every 12 (twelve) hours. 11/12/18   Alla Feeling, NP  morphine (MSIR) 15 MG tablet Take 1 tablet (15 mg total) by mouth every 6 (six) hours as needed for severe pain. 12/02/18   Tanner, Lyndon Code., PA-C  nicotine (NICODERM CQ - DOSED IN MG/24 HR) 7 mg/24hr patch Place 1 patch (7 mg total) onto the skin daily. 11/13/18   Alla Feeling, NP  ondansetron (ZOFRAN ODT) 8 MG disintegrating tablet Take 1 tablet (8 mg total) by mouth every 8 (eight) hours as needed  for nausea or vomiting. 10/11/18   Truitt Merle, MD  pantoprazole (PROTONIX) 40 MG tablet Take 1 tablet (40 mg total) by mouth daily. Patient not taking: Reported on 11/06/2018 08/20/18   Truitt Merle, MD  polyethylene glycol (MIRALAX / GLYCOLAX) 17 g packet Take 17 g by mouth daily. 11/09/18   Swayze, Ava, DO  potassium chloride SA (KLOR-CON) 20 MEQ tablet Take 1 tablet (20 mEq total) by mouth daily. 12/02/18   Tanner, Lyndon Code., PA-C  promethazine (PHENERGAN) 25 MG suppository Place 1 suppository (25 mg total) rectally every 6 (six) hours as needed for nausea  or vomiting. Do not take within 6 hours of oral phenergan tablet 11/13/18   Alla Feeling, NP  promethazine (PHENERGAN) 25 MG tablet Take 1 tablet (25 mg total) by mouth every 6 (six) hours as needed for nausea or vomiting. Patient not taking: Reported on 11/06/2018 06/19/18   Stark Klein, MD  scopolamine (TRANSDERM-SCOP) 1 MG/3DAYS Place 1 patch (1.5 mg total) onto the skin every 3 (three) days. 11/13/18   Alla Feeling, NP    ALLERGIES:  Allergies  Allergen Reactions  . Latex Hives    SOCIAL HISTORY:  Social History   Tobacco Use  . Smoking status: Current Every Day Smoker    Packs/day: 1.00    Years: 37.00    Pack years: 37.00    Types: Cigarettes  . Smokeless tobacco: Never Used  . Tobacco comment: Started back smoking  Substance Use Topics  . Alcohol use: Not Currently    FAMILY HISTORY: Family History  Problem Relation Age of Onset  . Diabetes Mother   . Hypertension Mother   . COPD Mother   . Uterine cancer Mother 40       had hysterectomy  . Diabetes Sister   . Hypertension Sister   . Lung cancer Maternal Grandmother        lung cancer  . Hypertension Sister     EXAM: BP 109/85 (BP Location: Left Arm)   Pulse (!) 104   Temp 98.5 F (36.9 C) (Oral)   Resp (!) 22   SpO2 100%  CONSTITUTIONAL: Alert and oriented and responds appropriately to questions.  Thin, in no distress HEAD: Normocephalic EYES: Conjunctivae clear, pupils appear equal, EOM appear intact ENT: normal nose; moist mucous membranes NECK: Supple, normal ROM CARD: Regular and minimally tachycardic; S1 and S2 appreciated; no murmurs, no clicks, no rubs, no gallops RESP: Normal chest excursion without splinting or tachypnea; breath sounds clear and equal bilaterally; no wheezes, no rhonchi, no rales, no hypoxia or respiratory distress, speaking full sentences ABD/GI: Normal bowel sounds; non-distended; soft, mildly tender to palpation diffusely without guarding or rebound BACK:  The back  appears normal EXT: Normal ROM in all joints; no deformity noted, no edema; no cyanosis SKIN: Normal color for age and race; warm; no rash on exposed skin NEURO: Moves all extremities equally PSYCH: The patient's mood and manner are appropriate.   MEDICAL DECISION MAKING: Patient here with exacerbation of her chronic pain, vomiting and diarrhea.  Presents frequently to the ED for the same.  States she does not feel she needs admission to the hospital but is requesting IV fluids, pain and nausea medicine.  Abdominal exam is relatively benign, nonperitoneal.  She has had numerous CTs of the abdomen pelvis.  I do not feel this needs to be repeated today for chronic symptoms.  She is afebrile here.  Will treat symptoms with IV fluids, Zofran and Dilaudid per  her request.  Per review of the [place] controlled substance reporting system, patient has an overdose score of 600.  12/03/2018  4   12/03/2018  Dronabinol 5 MG Capsule  60.00  30 [place]   [id number]   [place] (0126)   0   Medicaid   [place]  12/02/2018  4   12/02/2018  Morphine Sulfate Ir 15 MG Tab  45.00  11 [address] [place]   [id number]   [place]es (0126)   0  61.36 MME  Medicaid   [place]  11/21/2018  4   11/21/2018  Morphine Sulfate Ir 15 MG Tab  45.00  11 [place]   [id number]   [place]es (0126)   0  61.36 MME  Medicaid   [place]  11/12/2018  4   11/12/2018  Morphine Sulf Er 15 MG Tablet  60.00  30 [place]   [id number]   [place] (0126)   0  30.00 MME  Medicaid   [place]  11/12/2018  4   11/12/2018  Morphine Sulfate Ir 15 MG Tab  45.00  8 [place]   [id number]   [place]es (0126)   0  84.38 MME  Medicaid   [place]  11/08/2018  4   11/08/2018  Morphine Sulf Er 15 MG Tablet  8.00  4 [address]   [id number]   [place]es (0126)   0  30.00 MME  Medicaid   [place]  11/08/2018  4   11/08/2018  Morphine Sulfate Ir 15 MG Tab  16.00  2 [address]   [id number]   Wes (0126)   0  120.00 MME  Medicaid   [place]  10/24/2018  4   10/24/2018  Morphine Sulfate Ir 15 MG Tab  90.00  22 [address]   [id number]   Wes ([id number])   0  61.36 MME  Medicaid   [place]  10/22/2018  1   10/20/2018   Oxycodone-Acetaminophen 5-325  13.00  3 [name]   [id number]   [place] (2001)   0  32.50 MME  Medicaid        ED PROGRESS: Patient reports feeling much better after Dilaudid, IV fluids and Zofran.  Her heart rate and respiratory rate have improved and she is requesting a sandwich and something to drink.  Requesting 1 more dose of pain medication.  Patient has no urinary symptoms.  I do not feel she needs urinalysis.  Her labs appear to be near baseline.  Minimal elevation of her AST, ALT and alkaline phosphatase that were present previously.  No specific right upper quadrant tenderness on exam.  She denies having any symptoms or urinary tract infection.  No vaginal bleeding or discharge.  States when she has flareups of her chronic pain she does not always need antibiotics.  I do not feel antibiotics are indicated today.  She has not had any vomiting or diarrhea.  No stool to send for stool samples.  Very low suspicion for C. Difficile.   Plan is to discharge after second dose of Dilaudid and after patient tolerates p.o.  At this time, I do not feel there is any life-threatening condition present. I have reviewed, interpreted and discussed all results (EKG, imaging, lab, urine as appropriate) and exam findings with patient/family. I have reviewed nursing notes and appropriate previous records.  I feel the patient is safe to be discharged home without further emergent workup and can continue workup as an outpatient as needed. Discussed usual and customary return precautions. Patient/family verbalize understanding  and are comfortable with this plan.  Outpatient follow-up has been provided as needed. All questions have been answered.   Kajal Scalici was evaluated in Emergency Department on 12/10/2018 for the symptoms described in the history of present illness. She was evaluated in the context of the global COVID-19 pandemic, which necessitated consideration that the patient might be at risk for infection  with the SARS-CoV-2 virus that causes COVID-19. Institutional protocols and algorithms that pertain to the evaluation of patients at risk for COVID-19 are in a state of rapid change based on information released by regulatory bodies including the CDC and federal and state organizations. These policies and algorithms were followed during the patient's care in the ED.  Patient was seen wearing N95, face shield, gloves.    Dalis Beers, Delice Bison, DO 12/10/18 [phone]

## 2018-12-10 NOTE — ED Triage Notes (Signed)
Patient arrived stating that she is having intermittent severe abdominal pain that started last week and concerned about a colitis flare up. Reports NVD. States she has pain medication at home but has been unable to keep it down.

## 2018-12-10 NOTE — Telephone Encounter (Signed)
Called pt regarding missed appts this am, no answer.  Pt seen in ED this am, has not contacted MD Feng's office/RN Malachy Mood about f/u.  Routing message to Leeann Must (MD Burr Medico).

## 2018-12-10 NOTE — ED Notes (Signed)
Patient made aware that urine and stool samples are needed.

## 2018-12-10 NOTE — ED Notes (Signed)
Patient able to tolerate oral fluids and food with no nausea or episodes of emesis.

## 2018-12-10 NOTE — Addendum Note (Signed)
Addended by: Truitt Merle on: 12/10/2018 02:18 PM   Modules accepted: Orders

## 2018-12-10 NOTE — Progress Notes (Signed)
VO per MD Burr Medico to increase IVF rate from 500 ml/hr to 750 ml/hr.  Pt reports feeling better at end of infusion with 2/10 abd pain and no nausea.  MD Burr Medico aware.  Pt requested to leave early before finishing last part of IVF.  MD Burr Medico aware.  Pt ambulatory to scheduling with belongings then exit.  A&Ox4.  Ambulatory w/steady gait.  Denies any questions or concerns at time of d/c, states that a friend will be driving her home today.

## 2018-12-10 NOTE — Patient Instructions (Signed)

## 2018-12-10 NOTE — Discharge Instructions (Signed)
Please follow-up with your oncologist for further management of your chronic abdominal pain, nausea, vomiting and diarrhea.  We cannot discharge you with any further narcotics from the emergency department.  This will be may need to be managed by your outpatient team.

## 2018-12-11 ENCOUNTER — Telehealth: Payer: Self-pay | Admitting: Hematology

## 2018-12-11 NOTE — Telephone Encounter (Signed)
Scheduled appt per 11/24 los.  Spoke with pt and she is aware of her appt date and time.

## 2018-12-17 NOTE — Progress Notes (Signed)
  Radiation Oncology         [phone]) [phone] ________________________________  Name: Jane Brooks MRN: [id number]  Date: 11/12/2018  DOB: 08-29-1970  SIMULATION AND TREATMENT PLANNING NOTE  DIAGNOSIS:     ICD-10-CM   1. Malignant neoplasm of head of pancreas ([place])  C25.0      Site:  abdomen  NARRATIVE:  The patient has suffered from weight loss during her course of XRT. She therefore requires a re-simulation to account for this anatomic change. The patient was brought to the [facility].  Identity was confirmed.  All relevant records and images related to the planned course of therapy were reviewed.   Written consent to proceed with treatment was confirmed which was freely given after reviewing the details related to the planned course of therapy had been reviewed with the patient.  Then, the patient was set-up in a stable reproducible  supine position for radiation therapy.  CT images were obtained.  Surface markings were placed.    Medically necessary complex treatment device(s) for immobilization:  Vac-lock bag.   The CT images were loaded into the planning software.  Then the target and avoidance structures were contoured.  Treatment planning then occurred.  The radiation prescription was entered and confirmed.  A total of 3 complex treatment devices were fabricated which relate to the designed radiation treatment fields. Each of these customized fields/ complex treatment devices will be used on a daily basis during the radiation course. I have requested : Intensity Modulated Radiotherapy (IMRT) is medically necessary for this case for the following reason:  Small bowel/ cord/ kidney sparing.Marland Kitchen   PLAN:  The patient will receive 5.4 Gy in 3 fractions for the boost treatment.    ________________________________   Jodelle Gross, MD, PhD

## 2018-12-18 ENCOUNTER — Other Ambulatory Visit: Payer: Self-pay

## 2018-12-18 ENCOUNTER — Inpatient Hospital Stay: Payer: Medicaid Other

## 2018-12-18 ENCOUNTER — Inpatient Hospital Stay: Payer: Medicaid Other | Attending: Hematology

## 2018-12-18 VITALS — BP 96/76 | HR 97 | Temp 98.9°F | Resp 18

## 2018-12-18 DIAGNOSIS — C250 Malignant neoplasm of head of pancreas: Secondary | ICD-10-CM | POA: Insufficient documentation

## 2018-12-18 DIAGNOSIS — Z79899 Other long term (current) drug therapy: Secondary | ICD-10-CM | POA: Diagnosis not present

## 2018-12-18 DIAGNOSIS — R112 Nausea with vomiting, unspecified: Secondary | ICD-10-CM | POA: Insufficient documentation

## 2018-12-18 DIAGNOSIS — Z8042 Family history of malignant neoplasm of prostate: Secondary | ICD-10-CM | POA: Insufficient documentation

## 2018-12-18 DIAGNOSIS — I2510 Atherosclerotic heart disease of native coronary artery without angina pectoris: Secondary | ICD-10-CM | POA: Diagnosis not present

## 2018-12-18 DIAGNOSIS — R109 Unspecified abdominal pain: Secondary | ICD-10-CM | POA: Diagnosis not present

## 2018-12-18 DIAGNOSIS — K760 Fatty (change of) liver, not elsewhere classified: Secondary | ICD-10-CM | POA: Diagnosis not present

## 2018-12-18 DIAGNOSIS — I700 Atherosclerosis of aorta: Secondary | ICD-10-CM | POA: Insufficient documentation

## 2018-12-18 DIAGNOSIS — F17210 Nicotine dependence, cigarettes, uncomplicated: Secondary | ICD-10-CM | POA: Insufficient documentation

## 2018-12-18 DIAGNOSIS — Z801 Family history of malignant neoplasm of trachea, bronchus and lung: Secondary | ICD-10-CM | POA: Insufficient documentation

## 2018-12-18 LAB — CBC WITH DIFFERENTIAL (CANCER CENTER ONLY)
Abs Immature Granulocytes: 0.04 10*3/uL (ref 0.00–0.07)
Basophils Absolute: 0 10*3/uL (ref 0.0–0.1)
Basophils Relative: 0 %
Eosinophils Absolute: 0.1 10*3/uL (ref 0.0–0.5)
Eosinophils Relative: 1 %
HCT: 28.3 % — ABNORMAL LOW (ref 36.0–46.0)
Hemoglobin: 9.5 g/dL — ABNORMAL LOW (ref 12.0–15.0)
Immature Granulocytes: 1 %
Lymphocytes Relative: 8 %
Lymphs Abs: 0.4 10*3/uL — ABNORMAL LOW (ref 0.7–4.0)
MCH: 21.4 pg — ABNORMAL LOW (ref 26.0–34.0)
MCHC: 33.6 g/dL (ref 30.0–36.0)
MCV: 63.7 fL — ABNORMAL LOW (ref 80.0–100.0)
Monocytes Absolute: 0.5 10*3/uL (ref 0.1–1.0)
Monocytes Relative: 10 %
Neutro Abs: 4.1 10*3/uL (ref 1.7–7.7)
Neutrophils Relative %: 80 %
Platelet Count: 142 10*3/uL — ABNORMAL LOW (ref 150–400)
RBC: 4.44 MIL/uL (ref 3.87–5.11)
RDW: 19.3 % — ABNORMAL HIGH (ref 11.5–15.5)
WBC Count: 5.2 10*3/uL (ref 4.0–10.5)
nRBC: 0.4 % — ABNORMAL HIGH (ref 0.0–0.2)

## 2018-12-18 LAB — CMP (CANCER CENTER ONLY)
ALT: 64 U/L — ABNORMAL HIGH (ref 0–44)
AST: 64 U/L — ABNORMAL HIGH (ref 15–41)
Albumin: 2.9 g/dL — ABNORMAL LOW (ref 3.5–5.0)
Alkaline Phosphatase: 188 U/L — ABNORMAL HIGH (ref 38–126)
Anion gap: 10 (ref 5–15)
BUN: 9 mg/dL (ref 6–20)
CO2: 26 mmol/L (ref 22–32)
Calcium: 7.9 mg/dL — ABNORMAL LOW (ref 8.9–10.3)
Chloride: 100 mmol/L (ref 98–111)
Creatinine: 0.61 mg/dL (ref 0.44–1.00)
GFR, Est AFR Am: >60 mL/min (ref >60)
GFR, Estimated: >60 mL/min (ref >60)
Glucose, Bld: 101 mg/dL — ABNORMAL HIGH (ref 70–99)
Potassium: 3.9 mmol/L (ref 3.5–5.1)
Sodium: 136 mmol/L (ref 135–145)
Total Bilirubin: 0.6 mg/dL (ref 0.3–1.2)
Total Protein: 5.7 g/dL — ABNORMAL LOW (ref 6.5–8.1)

## 2018-12-18 MED ORDER — SODIUM CHLORIDE 0.9% FLUSH
10.0000 mL | INTRAVENOUS | Status: DC | PRN
  Administered 2018-12-18: 10 mL
  Filled 2018-12-18: qty 10

## 2018-12-18 MED ORDER — SODIUM CHLORIDE 0.9% FLUSH
10.0000 mL | Freq: Once | INTRAVENOUS | Status: AC | PRN
  Administered 2018-12-18: 10 mL
  Filled 2018-12-18: qty 10

## 2018-12-18 MED ORDER — HEPARIN SOD (PORK) LOCK FLUSH 100 UNIT/ML IV SOLN
500.0000 [IU] | Freq: Once | INTRAVENOUS | Status: AC | PRN
  Administered 2018-12-18: 500 [IU]
  Filled 2018-12-18: qty 5

## 2018-12-18 MED ORDER — SODIUM CHLORIDE 0.9 % IV SOLN
Freq: Once | INTRAVENOUS | Status: AC
  Administered 2018-12-18: 14:00:00 via INTRAVENOUS
  Filled 2018-12-18: qty 250

## 2018-12-18 NOTE — Progress Notes (Signed)
Patient requesting to receive IV fluids over 1 hour. Dr. Burr Medico notified and gave OK to run 1L over 1 hour.

## 2018-12-18 NOTE — Progress Notes (Signed)
  Radiation Oncology         [phone] ________________________________  Name: Fronia Depass MRN: [id number]  Date: 11/21/2018  DOB: [birth year]  End of Treatment Note  Diagnosis:   Pancreatic cancer     Indication for treatment::  curative       Radiation treatment dates:   10/09/18 - 11/21/18  Site/dose:   The abdomen was treated initially to a dose of 45 Gy in 25 fractions using an IMRT technique. The patient then received a boost of 5.4 Gy.  Narrative: The patient tolerated radiation treatment relatively well.   The patient continued to be treated for decreased appetite/ nause during XRT, but this was fairly stable.   Plan: The patient has completed radiation treatment. The patient will return to radiation oncology clinic for routine followup in one month. I advised the patient to call or return sooner if they have any questions or concerns related to their recovery or treatment. ________________________________  Jodelle Gross, M.D., Ph.D.

## 2018-12-18 NOTE — Patient Instructions (Signed)

## 2018-12-20 ENCOUNTER — Other Ambulatory Visit: Payer: Self-pay

## 2018-12-20 ENCOUNTER — Encounter (HOSPITAL_COMMUNITY)
Admission: RE | Admit: 2018-12-20 | Discharge: 2018-12-20 | Disposition: A | Discharge status: Home / Self Care | Payer: Medicaid Other | Source: Ambulatory Visit | Attending: Hematology | Admitting: Hematology

## 2018-12-20 DIAGNOSIS — C250 Malignant neoplasm of head of pancreas: Secondary | ICD-10-CM | POA: Diagnosis present

## 2018-12-20 LAB — GLUCOSE, CAPILLARY: Glucose-Capillary: 84 mg/dL (ref 70–99)

## 2018-12-20 MED ORDER — FLUDEOXYGLUCOSE F - 18 (FDG) INJECTION
5.0000 | Freq: Once | INTRAVENOUS | Status: AC | PRN
  Administered 2018-12-20: 07:00:00 5 via INTRAVENOUS

## 2018-12-23 ENCOUNTER — Telehealth: Payer: Self-pay | Admitting: Radiation Oncology

## 2018-12-23 NOTE — Telephone Encounter (Signed)
  Radiation Oncology         [phone]) [phone] ________________________________  Name: Jane Brooks MRN: [id number]  Date of Service: 12/23/2018  DOB: 1970-11-22  Post Treatment Telephone Note  Diagnosis:  Stage IB, cT2N0M0 borderline resectable s/p neoadjuvant chemotherapy and Whipple with positive margins and single node metastasis at the time of surgery  Interval Since Last Radiation:  5 weeks   10/09/18 - 11/21/18 The pancreatic fossa and postop site of prior whipple in the abdomen was treated initially to a dose of 45 Gy in 25 fractions using an IMRT technique. The patient then received a boost of 5.4 Gy.  Narrative:  The patient was contacted today for routine follow-up. During treatment she did very well with radiotherapy and did not have significant desquamation. She did have some decreased appetite and nausea during radiotherapy.  Impression/Plan: 1. Stage IB, cT2N0M0 borderline resectable s/p neoadjuvant chemotherapy and Whipple with positive margins and single node metastasis at the time of surgery. I left her a voicemail and let her know we would be happy to continue to follow her as needed, but while she also continues to follow up with Dr. Burr Medico in medical oncology.     Carola Rhine, PAC

## 2018-12-24 NOTE — Progress Notes (Signed)
[place]   Telephone:([phone]) [phone] [phone]   Clinic Follow up Note   Patient Care Team: Patient, No Pcp Per as PCP - General (General Practice) Milus Banister, MD as Attending Physician (Gastroenterology) Truitt Merle, MD as Consulting Physician (Hematology) 12/25/2018  CHIEF COMPLAINT: F/u pancreas cancer   SUMMARY OF ONCOLOGIC HISTORY: Oncology History Overview Note  Cancer Staging Pancreatic cancer [facility]) Staging form: Exocrine Pancreas, AJCC 8th Edition - Clinical stage from 11/08/2017: Stage IB (cT2, cN0, cM0) - Signed by Truitt Merle, MD on 11/09/2017     Malignant neoplasm of head of pancreas ([place])  10/30/2017 Imaging   MRI Abdomen 10/30/17  IMPRESSION: 1. Marked intra and extrahepatic biliary duct dilatation with abrupt cut off of the common bile duct at the pancreatic head. This is associated with marked dilatation of the main pancreatic duct also with an abrupt cut off at the level of the pancreatic head. Pancreatic parenchyma in the body and tail of pancreas is markedly atrophic. A subtle 2.5 x 3 cm lesion with differential signal intensity is identified in the head of the pancreas which appears slightly hypoenhancing on postcontrast imaging. Together, these features are highly concerning for pancreatic adenocarcinoma. EUS is recommended to further evaluate. 2. Portal vein is patent although there is some mass-effect on the portal splenic confluence and IVC. Fat planes around celiac axis and SMA appear preserved. 3. Upper normal to borderline portal caval lymph node associated with small right para-aortic lymphadenopathy.     10/30/2017 Tumor Marker   Baseline Ca 19-9 at 140   10/31/2017 Procedure   ERCP by Dr. Lyndel Safe 10/31/17  IMPRESSION Malignant appearing CBD stricture (due to pancreatic mass) status post sphincterotomy and stent insertion.   11/08/2017 Cancer Staging   Staging form: Exocrine Pancreas, AJCC 8th Edition -  Clinical stage from 11/08/2017: Stage IB (cT2, cN0, cM0) - Signed by Truitt Merle, MD on 11/09/2017   11/08/2017 Procedure   EUS and EGD by Dr. Ardis Hughs 11/08/17  IMPRESSION -Very vague, irregularly bordered, approximately 2.6cm mass was found in the head of pancreas. This causes biliary and pancreatic duct obstruction. The previously plastic biliary stent is in good position. The mass directly abuts and attenuates the PV/SMV confluence, suggesting invasion. FNA performed and the preliminary cytology review is positive for maligancy (adenocarcinoma). - [facility] office will arrange referrals to medical and surgical oncology. - Case to be discussed at upcoming multidisciplinary GI tumor conference.   11/08/2017 Initial Biopsy   Biopsy Cytology 11/08/17 Diagnosis FINE NEEDLE ASPIRATION, ENDOSCOPIC, PANCREAS HEAD (SPECIMEN 1 OF 1 COLLECTED 11/08/17): MALIGNANT CELLS CONSISTENT WITH ADENOCARCINOMA.   11/09/2017 Initial Diagnosis   Pancreatic cancer ([place])   11/15/2017 Imaging   CT Chest and Pelvis WO Contrast 11/15/17 IMPRESSION: 1. No findings of active malignancy in the chest or pelvis. 2. Aortic Atherosclerosis (ICD10-I70.0). Coronary atherosclerosis. 3. Faint centrilobular nodularity in the lung apices, raising the possibility of hypersensitivity pneumonitis or less likely respiratory bronchiolitis. Airway thickening is present, suggesting bronchitis or reactive airways disease.   11/19/2017 -  Chemotherapy   FOLFIRINOX q2weeks with Neulasta injection for 4 months starting 11/19/17. Completed 8 cycles on 03/07/18    02/04/2018 Imaging   CT AP W Contrast 02/04/18  IMPRESSION: 1. No substantial change in size of mass involving head of pancreas. A aortocaval lymph node which appears increased in the interval measuring 1.1 cm versus 0.8 cm previously. Stable 1.2 cm peripancreatic node. 2. Dilated proximal common bile duct is mildly increased in caliber compared  with 12/12/2017. The  common bile duct stent remains in place in appears to be in appropriate position.    04/19/2018 Imaging   CT AP 04/19/18  IMPRESSION: 1. Essentially stable exam compared to most recent CT scans of 02/04/2018 and 12/12/2017. 2. Stable mass in the head of the pancreas with duct dilatation and atrophy upstream. Mass continues to surround the GDA and contacts the portal vein but is free of the celiac trunk and SMA. Mass probably contacts the RIGHT hepatic artery at the takeoff of the GDA (image 56/5). Dominant LEFT hepatic artery originates from the LEFT gastric. 3. Mild intrahepatic and extrahepatic duct dilatation similar to prior with flexible stent in the common bile duct. 4. No new or progressive disease. 5. No peritoneal metastasis.   04/25/2018 Imaging   CT Angio chest 04/25/18  IMPRESSION: 1. No demonstrable pulmonary embolus. No thoracic aortic aneurysm or dissection. 2. Areas of atelectatic change bilaterally. No consolidation. No pneumothorax. Endotracheal tube tip is slightly superior to the carina. 3. Enlarged subcarinal lymph node, concerning for neoplastic etiology. 4. 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 Biliary stent present. Biliary duct air is felt to be secondary to the stent placement. 5. Mild upper abdominal ascites. Pneumoperitoneum with apparent drainage catheter present. The pneumoperitoneum is likely of post procedural/postoperative etiology. Bowel perforation in this circumstance cannot be excluded entirely, however. Close clinical assessment in this regard advised. 6.  Left ventricular hypertrophy.   04/25/2018 Surgery   LAPAROSCOPY DIAGNOSTIC and WHIPPLE PROCEDURE, RECONSTRUCTION OF PORTAL VEIN by Dr. Barry Dienes, Dr. Marcello Moores 04/25/18    04/25/2018 Pathology Results    Diagnosis 04/25/18 1. Whipple procedure/resection, with gallbladder with segment of portal vein - INVASIVE ADENOCARCINOMA, MODERATELY DIFFERENTIATED, SPANNING 3.5 CM. -  ADENOCARCINOMA IS PRESENT AT THE SUPERIOR MARGIN OF THE ATTACHED VEIN AND THE INFERIOR MARGIN OF THE ATTACHED VEIN (BLOCKS E AND F). - ADENOCARCINOMA EXTENDS INTO PERIPANCREATIC SOFT TISSUE. - PERINEURAL INVASION IS IDENTIFIED, DIFFUSE. - METASTATIC ADENOCARCINOMA IN 1 OF 24 LYMPH NODES (1/24). - SEE ONCOLOGY TABLE BELOW. 2. Lymph node, biopsy, portal - THERE IS NO EVIDENCE OF CARCINOMA IN 5 OF 5 LYMPH NODES (0/5). 3. Pancreas, biopsy, additional pancreatic margin - HIGH GRADE GLANDULAR DYSPLASIA, FOCAL    05/09/2018 Imaging   CT AP W Contrast 05/09/18 IMPRESSION: Status post Whipple's procedure. 1 surgical drain remains in the area. Pancreatic duct stent is noted.   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 It appears to be stable in size compared to prior exam.   Wall and fold thickening is seen involving right and transverse colon which may represent edema or possibly inflammation.   Aortic Atherosclerosis (ICD10-I70.0).   06/15/2018 Imaging   CT AP W Contrast 06/15/18 IMPRESSION: Changes of prior Whipple procedure. Previously seen pancreatic duct stent presumably has migrated across the choledocho-jejunostomy and is now located in the biliary ducts. No biliary or pancreatic ductal dilatation.   Severe diffuse low-density throughout the liver, likely fatty infiltration.   Wall thickening within the ascending colon and hepatic flexure concerning for colitis.   Small amount of free fluid in the pelvis.   Aortic atherosclerosis.   07/25/2018 PET scan   PET 07/25/18  IMPRESSION: 1. Right hepatic lobe hypermetabolism, in the setting of heterogeneous hepatic steatosis. Finding is highly suspicious for isolated hepatic metastasis. 2. More equivocal lower level hypermetabolism along the medial hepatic capsule and within  the hepatic dome. These areas could be postoperative and  physiologic respectively. Recommend attention on follow-up. 3. Coronary artery atherosclerosis. Aortic Atherosclerosis (ICD10-I70.0).   08/13/2018 Pathology Results   Diagnosis 08/13/18 Liver, needle/core biopsy, right - LIVER PARENCHYMA WITH A BILE EXTRAVASATION AND HISTIOCYTES - SEE COMMENT   08/21/2018 Imaging   CT AP W Contrast  IMPRESSION: 1. Diffuse colonic wall thickening and submucosal edema suggestive of colitis, findings are similar to prior study. 2. Interval improvement in the degree of gastric wall thickening and edema compared to prior CT. 3. Small volume ascites, unchanged. 4. Diffuse hepatic steatosis with grossly stable area of non masslike enhancement in the right hepatic lobe. Continued imaging surveillance of this finding is suggested.     10/09/2018 - 11/21/2018 Radiation Therapy   Adjuvant radiation with Dr. Lisbeth Renshaw 10/09/18-11/21/18   11/07/2018 Imaging   CT AP W Contrast  IMPRESSION: 1. There remains edematous appearance to the proximal transverse colon and hepatic flexure as well as possibly the sigmoid colon. Findings may be infectious or inflammatory. This however overall appears to be improved since the prior exam of October 20, 2018. 2. Moderate to large amount of colonic stool. 3. Hepatic steatosis with heterogeneous enhancement.     12/20/2018 PET scan   IMPRESSION: 1. I do not see any definite PET-CT findings to suggest recurrent tumor or metastatic disease. 2. Slight increased soft tissue density in the pancreatic bed region is likely granulation tissue or post radiation changes but no significant hypermetabolism to suggest recurrent tumor. 3. Two areas of slight increased FDG uptake in the liver but no definite hepatic metastatic disease. MRI may be helpful for further evaluation. 4. No findings for mesenteric or retroperitoneal metastatic lymphadenopathy or omental or peritoneal carcinomatosis.       CURRENT THERAPY: Surveillance and supportive  care  INTERVAL HISTORY: Ms. Hise returns for f/u as scheduled. She was last seen by Dr. Burr Medico on 12/10/18. She had PET scan on 12/20/18. She has good days and bad. Appetite fluctuates. She has low energy but does not do much. Not doing anything social. Nausea and vomiting is sporadic, past few days have been "alright." Scopolamine patch has been most effective. Bowels moving normally. Abdomen feels sore after BM, she can usually wait 4-5 hours between pain meds. Occasionally takes 2 MSIR when she wakes up because pain is so bad. Pain level is 9/10 today. Denies fever, chills, cough, chest pain, dyspnea, leg swelling.    MEDICAL HISTORY:  Past Medical History:  Diagnosis Date  . Family history of lung cancer   . Family history of prostate cancer   . Family history of uterine cancer   . Gallstones 10/2017  . Pancreatic cancer ([place])   . Pneumothorax, closed, traumatic    years ago  . PONV (postoperative nausea and vomiting)     SURGICAL HISTORY: Past Surgical History:  Procedure Laterality Date  . BILIARY STENT PLACEMENT  10/31/2017   Procedure: BILIARY STENT PLACEMENT;  Surgeon: [name], MD;  [facility] ENDOSCOPY;  Service: Gastroenterology;;  . BIOPSY  10/23/2018   Procedure: BIOPSY;  Surgeon: [name], MD;  Location: [facility] ENDOSCOPY;  Service: Endoscopy;;  . COLONOSCOPY WITH PROPOFOL N/A 10/23/2018   Procedure: COLONOSCOPY WITH PROPOFOL;  Surgeon: [name], MD;  Location: [facility] ENDOSCOPY;  Service: Endoscopy;  Laterality: N/A;  . ERCP N/A 10/31/2017   Procedure: ENDOSCOPIC RETROGRADE CHOLANGIOPANCREATOGRAPHY (ERCP);  Surgeon: [name], MD;  Location: [facility] ENDOSCOPY;  Service: Gastroenterology;  Laterality: N/A;  . ESOPHAGOGASTRODUODENOSCOPY N/A 11/08/2017  Procedure: ESOPHAGOGASTRODUODENOSCOPY (EGD);  Surgeon: Milus Banister, MD;  Location: [facility] ENDOSCOPY;  Service: Endoscopy;  Laterality: N/A;  . EUS N/A 11/08/2017   Procedure: UPPER ENDOSCOPIC ULTRASOUND (EUS)  RADIAL;  Surgeon: Milus Banister, MD;  Location: [facility] ENDOSCOPY;  Service: Endoscopy;  Laterality: N/A;  . FINE NEEDLE ASPIRATION  11/08/2017   Procedure: FINE NEEDLE ASPIRATION;  Surgeon: Milus Banister, MD;  Location: [facility] ENDOSCOPY;  Service: Endoscopy;;  . IR IMAGING GUIDED PORT INSERTION  11/15/2017  . LAPAROSCOPY N/A 04/25/2018   Procedure: LAPAROSCOPY DIAGNOSTIC;  Surgeon: Stark Klein, MD;  Location: [place];  Service: General;  Laterality: N/A;  GENERAL AND EPIDURAL ANESTHESIA  . SPHINCTEROTOMY  10/31/2017   Procedure: SPHINCTEROTOMY;  Surgeon: [name], MD;  Location: [facility] ENDOSCOPY;  Service: Gastroenterology;;  . TONSILLECTOMY    . WHIPPLE PROCEDURE N/A 04/25/2018   Procedure: WHIPPLE PROCEDURE, RECONSTRUCTION OF PORTAL VEIN;  Surgeon: Stark Klein, MD;  Location: [place];  Service: General;  Laterality: N/A;    I have reviewed the social history and family history with the patient and they are unchanged from previous note.  ALLERGIES:  is allergic to latex.  MEDICATIONS:  Current Outpatient Medications  Medication Sig Dispense Refill  . acetaminophen (TYLENOL) 325 MG tablet Take 2 tablets (650 mg total) by mouth every 6 (six) hours as needed for mild pain (or Fever >/= 101). 30 tablet 0  . cyclobenzaprine (FEXMID) 7.5 MG tablet Take 1 tablet (7.5 mg total) by mouth 3 (three) times daily as needed for muscle spasms. 30 tablet 0  . dicyclomine (BENTYL) 10 MG capsule Take 1 capsule (10 mg total) by mouth 3 (three) times daily between meals as needed for spasms. 30 capsule 0  . dronabinol (MARINOL) 5 MG capsule Take 1 capsule (5 mg total) by mouth 2 (two) times daily before lunch and supper. 60 capsule 0  . gabapentin (NEURONTIN) 300 MG capsule Take 1 capsule (300 mg total) by mouth 3 (three) times daily. 90 capsule 1  . lipase/protease/amylase (CREON) 36000 UNITS CPEP capsule Take 36,000 Units by mouth 3 (three) times daily before meals.    Marland Kitchen morphine (MS CONTIN) 15 MG 12 hr tablet  Take 1 tablet (15 mg total) by mouth every 12 (twelve) hours. 60 tablet 0  . morphine (MSIR) 15 MG tablet Take 1 tablet (15 mg total) by mouth every 4 (four) hours as needed for severe pain. 60 tablet 0  . nicotine (NICODERM CQ - DOSED IN MG/24 HR) 7 mg/24hr patch Place 1 patch (7 mg total) onto the skin daily. 28 patch 0  . ondansetron (ZOFRAN ODT) 4 MG disintegrating tablet Take 1 tablet (4 mg total) by mouth every 6 (six) hours as needed for nausea or vomiting. 20 tablet 0  . polyethylene glycol (MIRALAX / GLYCOLAX) 17 g packet Take 17 g by mouth daily. 14 each 0  . potassium chloride SA (KLOR-CON) 20 MEQ tablet Take 1 tablet (20 mEq total) by mouth daily. 30 tablet 1  . promethazine (PHENERGAN) 25 MG tablet Take 1 tablet (25 mg total) by mouth every 6 (six) hours as needed for nausea or vomiting. 15 tablet 0  . scopolamine (TRANSDERM-SCOP) 1 MG/3DAYS Place 1 patch (1.5 mg total) onto the skin every 3 (three) days. 10 patch 3   No current facility-administered medications for this visit.     PHYSICAL EXAMINATION: ECOG PERFORMANCE STATUS: 2 - Symptomatic, <50% confined to bed  Vitals:   12/25/18 1437  BP: 105/70  Pulse:  86  Resp: 18  Temp: (!) 97.4 F (36.3 C)  SpO2: 100%   Filed Weights   12/25/18 1437  Weight: 87 lb 12.8 oz (39.8 kg)    GENERAL:alert, no distress and comfortable SKIN: no rash  EYES: sclera clear LUNGS: clear with normal breathing effort HEART: regular rate & rhythm, no lower extremity edema ABDOMEN: abdomen soft with normal bowel sounds. Epigastric tenderness and along midline incision  NEURO: alert & oriented x 3 with fluent speech, no focal motor/sensory deficits PAC without erythema   LABORATORY DATA:  I have reviewed the data as listed CBC Latest Ref Rng & Units 12/25/2018 12/18/2018 12/10/2018  WBC 4.0 - 10.5 K/uL 6.0 5.2 3.9(L)  Hemoglobin 12.0 - 15.0 g/dL 9.3(L) 9.5(L) 10.1(L)  Hematocrit 36.0 - 46.0 % 27.4(L) 28.3(L) 30.4(L)  Platelets 150 - 400  K/uL 150 142(L) 152     CMP Latest Ref Rng & Units 12/25/2018 12/18/2018 12/10/2018  Glucose 70 - 99 mg/dL 100(H) 101(H) 81  BUN 6 - 20 mg/dL 7 9 11   Creatinine 0.44 - 1.00 mg/dL 0.65 0.61 0.38(L)  Sodium 135 - 145 mmol/L 139 136 137  Potassium 3.5 - 5.1 mmol/L 3.7 3.9 3.3(L)  Chloride 98 - 111 mmol/L 103 100 103  CO2 22 - 32 mmol/L 28 26 25   Calcium 8.9 - 10.3 mg/dL 7.6(L) 7.9(L) 8.3(L)  Total Protein 6.5 - 8.1 g/dL 5.3(L) 5.7(L) 5.5(L)  Total Bilirubin 0.3 - 1.2 mg/dL 0.6 0.6 0.7  Alkaline Phos 38 - 126 U/L 158(H) 188(H) 139(H)  AST 15 - 41 U/L 47(H) 64(H) 70(H)  ALT 0 - 44 U/L 40 64(H) 50(H)      RADIOGRAPHIC STUDIES: I have personally reviewed the radiological images as listed and agreed with the findings in the report. No results found.   ASSESSMENT & PLAN: Taffany Heiser is a 48 yo female with   1. Abdominal pain, n/v/d -worsened after surgery and adjuvant radiation -requires aggressive supportive care  -on MS contin, MSIR, gabapentin, bentyl, zofran, and scopolamine patch. Patch has been most effective anti-emetic - she has increased abd pain in the morning, I recommend to increase gabapentin to 600 mg at night, and continue MS contin q12 and MSIR q4-6 hours PRN  2. 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 -Her postoprecovery has been difficult withrecurrentabdominal pain, nausea, vomiting and diarrhea, multiple ED visits and hospitalization -HerPET from 07/25/18 showedright hepatic lobe hypermetabolic activity, which is suspicious for isolated liver metastasis. There is also mild uptake of LN alongmedial hepatic capsule and within the hepatic dome, no other evidence of mets. -Her liver biopsy  from 08/13/18 showsno evidence of cancer cells.  - IRDr. Chestine Spore her sample was adequate and did not recommend a rebiopsy. - CT on 08/21/2018 done during her ED visit showed no clear evidence of metastasis -Due to her positive surgical margins,she was referred for adjuvant radiation per Dr. Lisbeth Renshaw which she completed from 10/09/18 - 11/21/18 -she had increased abdominal pain, n/v after starting RT and required hospital admission and frequent supportive care for management. -She underwent PET scan on 12/20/18 which shows stable postsurgical changes from whipple and radiation changes in the pancreatic bed. The previous biopsy-negative focus in segment 5 appears slightly larger but minimally hypermetabolic, and there is a new area  more centrally of hypermetabolism in segment 5 more medially with SUV 4.77 which is indeterminate. Overall, no definite evidence of recurrent or metastatic disease, but remains suspicious. I reviewed this with Dr. Burr Medico and discussed with the patient.  -today's labs are stable, CA 19-9 from 12/2 is still pending.  -Will continue supportive care and close surveillance given her high risk for recurrence  -Next f/u in 4 weeks   3. Smoking Cessation  -has been using nicotine patch recently which has curbed smoking somewhat but she has not quit yet -I encouraged her to keep trying to quit cigarettes and marijuana  PLAN: -Labs, PET reviewed, no definite evidence of recurrence or metastasis   -Continue supportive care and close surveillance  -IVF today over 1 hour -Refill MSIR and scopolamine patch -Increase gabapentin to 300 mg AM/Pm and 600 mg at night -Lab, f/u in 3-4 weeks   All questions were answered. The patient knows to call the clinic with any problems, questions or concerns. No barriers to learning was detected. I spent 20 minutes counseling the patient face to face. The total time spent in the appointment was 25 minutes and more than 50% was on counseling and review  of test results     Alla Feeling, NP 12/25/18

## 2018-12-25 ENCOUNTER — Other Ambulatory Visit: Payer: Self-pay

## 2018-12-25 ENCOUNTER — Inpatient Hospital Stay (HOSPITAL_BASED_OUTPATIENT_CLINIC_OR_DEPARTMENT_OTHER): Payer: Medicaid Other | Admitting: Nurse Practitioner

## 2018-12-25 ENCOUNTER — Encounter: Payer: Self-pay | Admitting: Nurse Practitioner

## 2018-12-25 ENCOUNTER — Inpatient Hospital Stay: Payer: Medicaid Other

## 2018-12-25 VITALS — BP 107/71 | HR 79 | Resp 18

## 2018-12-25 VITALS — BP 105/70 | HR 86 | Temp 97.4°F | Resp 18 | Ht <= 58 in | Wt 87.8 lb

## 2018-12-25 DIAGNOSIS — R1084 Generalized abdominal pain: Secondary | ICD-10-CM | POA: Diagnosis not present

## 2018-12-25 DIAGNOSIS — C259 Malignant neoplasm of pancreas, unspecified: Secondary | ICD-10-CM | POA: Diagnosis not present

## 2018-12-25 DIAGNOSIS — C250 Malignant neoplasm of head of pancreas: Secondary | ICD-10-CM | POA: Diagnosis not present

## 2018-12-25 LAB — CMP (CANCER CENTER ONLY)
ALT: 40 U/L (ref 0–44)
AST: 47 U/L — ABNORMAL HIGH (ref 15–41)
Albumin: 2.8 g/dL — ABNORMAL LOW (ref 3.5–5.0)
Alkaline Phosphatase: 158 U/L — ABNORMAL HIGH (ref 38–126)
Anion gap: 8 (ref 5–15)
BUN: 7 mg/dL (ref 6–20)
CO2: 28 mmol/L (ref 22–32)
Calcium: 7.6 mg/dL — ABNORMAL LOW (ref 8.9–10.3)
Chloride: 103 mmol/L (ref 98–111)
Creatinine: 0.65 mg/dL (ref 0.44–1.00)
GFR, Est AFR Am: >60 mL/min (ref >60)
GFR, Estimated: >60 mL/min (ref >60)
Glucose, Bld: 100 mg/dL — ABNORMAL HIGH (ref 70–99)
Potassium: 3.7 mmol/L (ref 3.5–5.1)
Sodium: 139 mmol/L (ref 135–145)
Total Bilirubin: 0.6 mg/dL (ref 0.3–1.2)
Total Protein: 5.3 g/dL — ABNORMAL LOW (ref 6.5–8.1)

## 2018-12-25 LAB — CBC WITH DIFFERENTIAL (CANCER CENTER ONLY)
Abs Immature Granulocytes: 0.03 10*3/uL (ref 0.00–0.07)
Basophils Absolute: 0 10*3/uL (ref 0.0–0.1)
Basophils Relative: 0 %
Eosinophils Absolute: 0.1 10*3/uL (ref 0.0–0.5)
Eosinophils Relative: 1 %
HCT: 27.4 % — ABNORMAL LOW (ref 36.0–46.0)
Hemoglobin: 9.3 g/dL — ABNORMAL LOW (ref 12.0–15.0)
Immature Granulocytes: 1 %
Lymphocytes Relative: 8 %
Lymphs Abs: 0.5 10*3/uL — ABNORMAL LOW (ref 0.7–4.0)
MCH: 21.4 pg — ABNORMAL LOW (ref 26.0–34.0)
MCHC: 33.9 g/dL (ref 30.0–36.0)
MCV: 63 fL — ABNORMAL LOW (ref 80.0–100.0)
Monocytes Absolute: 0.4 10*3/uL (ref 0.1–1.0)
Monocytes Relative: 7 %
Neutro Abs: 5 10*3/uL (ref 1.7–7.7)
Neutrophils Relative %: 83 %
Platelet Count: 150 10*3/uL (ref 150–400)
RBC: 4.35 MIL/uL (ref 3.87–5.11)
RDW: 19.1 % — ABNORMAL HIGH (ref 11.5–15.5)
WBC Count: 6 10*3/uL (ref 4.0–10.5)
nRBC: 0 % (ref 0.0–0.2)

## 2018-12-25 MED ORDER — SODIUM CHLORIDE 0.9% FLUSH
10.0000 mL | Freq: Once | INTRAVENOUS | Status: AC | PRN
  Administered 2018-12-25: 10 mL
  Filled 2018-12-25: qty 10

## 2018-12-25 MED ORDER — MORPHINE SULFATE 15 MG PO TABS: 15.0000 mg | ORAL_TABLET | ORAL | 0 refills | Status: DC | PRN

## 2018-12-25 MED ORDER — GABAPENTIN 300 MG PO CAPS: ORAL_CAPSULE | ORAL | 1 refills | Status: DC

## 2018-12-25 MED ORDER — SODIUM CHLORIDE 0.9 % IV SOLN
Freq: Once | INTRAVENOUS | Status: AC
  Administered 2018-12-25: 15:00:00 via INTRAVENOUS
  Filled 2018-12-25: qty 250

## 2018-12-25 MED ORDER — HEPARIN SOD (PORK) LOCK FLUSH 100 UNIT/ML IV SOLN
500.0000 [IU] | Freq: Once | INTRAVENOUS | Status: AC | PRN
  Administered 2018-12-25: 500 [IU]
  Filled 2018-12-25: qty 5

## 2018-12-25 MED ORDER — SCOPOLAMINE 1 MG/3DAYS TD PT72: 1.0000 | MEDICATED_PATCH | TRANSDERMAL | 3 refills | Status: AC

## 2018-12-25 MED FILL — MORPHINE SULFATE 15 MG TABS: 15 | 10 days supply | Qty: 60 | Fill #0

## 2018-12-25 MED FILL — TRANSDERM-SCOP 1.5 MG/72HR: 1 | 30 days supply | Qty: 10 | Fill #0

## 2018-12-25 NOTE — Progress Notes (Signed)
Okay to give Normal saline IV fluids over 1 hour today, per Cira Rue, NP.

## 2018-12-25 NOTE — Patient Instructions (Signed)

## 2018-12-26 ENCOUNTER — Telehealth: Payer: Self-pay | Admitting: Nurse Practitioner

## 2018-12-26 LAB — CANCER ANTIGEN 19-9: CA 19-9: 56 U/mL — ABNORMAL HIGH (ref 0–35)

## 2018-12-26 MED FILL — GABAPENTIN 300 MG CAPSULE: 300 | 30 days supply | Qty: 120 | Fill #0

## 2018-12-26 NOTE — Telephone Encounter (Signed)
Scheduled appt per 12/9 los.  Spoke with pt and she is aware of the appt date and time

## 2019-01-03 ENCOUNTER — Inpatient Hospital Stay (HOSPITAL_BASED_OUTPATIENT_CLINIC_OR_DEPARTMENT_OTHER): Payer: Medicaid Other | Admitting: Medical

## 2019-01-03 ENCOUNTER — Emergency Department (HOSPITAL_COMMUNITY)
Admission: EM | Admit: 2019-01-03 | Discharge: 2019-01-03 | Discharge status: Absent without leave | Payer: Medicaid Other

## 2019-01-03 ENCOUNTER — Telehealth: Payer: Self-pay

## 2019-01-03 ENCOUNTER — Other Ambulatory Visit: Payer: Self-pay

## 2019-01-03 VITALS — BP 119/84 | HR 107 | Temp 98.3°F | Resp 19 | Ht <= 58 in | Wt 87.9 lb

## 2019-01-03 DIAGNOSIS — C259 Malignant neoplasm of pancreas, unspecified: Secondary | ICD-10-CM

## 2019-01-03 DIAGNOSIS — R112 Nausea with vomiting, unspecified: Secondary | ICD-10-CM

## 2019-01-03 DIAGNOSIS — R109 Unspecified abdominal pain: Secondary | ICD-10-CM | POA: Diagnosis not present

## 2019-01-03 DIAGNOSIS — R1084 Generalized abdominal pain: Secondary | ICD-10-CM

## 2019-01-03 DIAGNOSIS — F321 Major depressive disorder, single episode, moderate: Secondary | ICD-10-CM | POA: Diagnosis not present

## 2019-01-03 DIAGNOSIS — G8929 Other chronic pain: Secondary | ICD-10-CM | POA: Diagnosis not present

## 2019-01-03 DIAGNOSIS — C250 Malignant neoplasm of head of pancreas: Secondary | ICD-10-CM | POA: Diagnosis not present

## 2019-01-03 DIAGNOSIS — R1110 Vomiting, unspecified: Secondary | ICD-10-CM | POA: Insufficient documentation

## 2019-01-03 MED ORDER — HEPARIN SOD (PORK) LOCK FLUSH 100 UNIT/ML IV SOLN
500.0000 [IU] | Freq: Once | INTRAVENOUS | Status: AC
  Administered 2019-01-03: 500 [IU] via INTRAVENOUS
  Filled 2019-01-03: qty 5

## 2019-01-03 MED ORDER — PANTOPRAZOLE SODIUM 40 MG PO TBEC: 40.0000 mg | DELAYED_RELEASE_TABLET | Freq: Every day | ORAL | 3 refills | Status: AC

## 2019-01-03 MED ORDER — DULOXETINE HCL 20 MG PO CPEP: 20.0000 mg | ORAL_CAPSULE | Freq: Every day | ORAL | 1 refills | Status: AC

## 2019-01-03 MED ORDER — ONDANSETRON HCL 4 MG/2ML IJ SOLN
INTRAMUSCULAR | Status: AC
  Filled 2019-01-03: qty 4

## 2019-01-03 MED ORDER — HYDROMORPHONE HCL 1 MG/ML IJ SOLN
0.5000 mg | Freq: Once | INTRAMUSCULAR | Status: AC
  Administered 2019-01-03: 0.5 mg via INTRAVENOUS

## 2019-01-03 MED ORDER — MORPHINE SULFATE 15 MG PO TABS: 15.0000 mg | ORAL_TABLET | ORAL | 0 refills | Status: DC | PRN

## 2019-01-03 MED ORDER — SODIUM CHLORIDE 0.9 % IV SOLN
Freq: Once | INTRAVENOUS | Status: AC
  Filled 2019-01-03: qty 250

## 2019-01-03 MED ORDER — DRONABINOL 5 MG PO CAPS: 5.0000 mg | ORAL_CAPSULE | Freq: Two times a day (BID) | ORAL | 2 refills | Status: AC

## 2019-01-03 MED ORDER — DICYCLOMINE HCL 10 MG PO CAPS: 10.0000 mg | ORAL_CAPSULE | Freq: Three times a day (TID) | ORAL | 0 refills | Status: AC | PRN

## 2019-01-03 MED ORDER — HYDROMORPHONE HCL 1 MG/ML IJ SOLN
INTRAMUSCULAR | Status: AC
  Filled 2019-01-03: qty 1

## 2019-01-03 MED ORDER — ONDANSETRON HCL 4 MG/2ML IJ SOLN
8.0000 mg | Freq: Once | INTRAMUSCULAR | Status: AC
  Administered 2019-01-03: 8 mg via INTRAVENOUS

## 2019-01-03 MED ORDER — GABAPENTIN 300 MG PO CAPS: ORAL_CAPSULE | ORAL | 3 refills | Status: AC

## 2019-01-03 MED FILL — PANTOPRAZOLE SOD DR 40 MG T: 40 | 30 days supply | Qty: 30 | Fill #0

## 2019-01-03 MED FILL — DICYCLOMINE 10 MG CAPSULE: 10 | 10 days supply | Qty: 30 | Fill #0

## 2019-01-03 MED FILL — DULOXETINE HCL 20 MG CPEP: 20 | 30 days supply | Qty: 30 | Fill #0

## 2019-01-03 MED FILL — DRONABINOL 5 MG CAP: 5 | 30 days supply | Qty: 60 | Fill #0

## 2019-01-03 NOTE — Telephone Encounter (Signed)
Pt. Here today A&O x 3 for nausea, vomiting, and pain Pt. States she has 8/10 pain in the abdomen. Pt states she has been vomiting since yesterday. And is unable to keep anything down. Pt seen By Sandi Mealy PA. Van's recommendation 1 liter of NS fluids, Zofran, and .5 of dilaudid. Port accessed IV fluid, Zofran, and dilaudid given. Pt. Tolerated well,Dilaudid given by Valda Favia RN. Pt tolerated well. Pt. Discharged from symptom management.

## 2019-01-03 NOTE — Telephone Encounter (Signed)
Patient calls stating she has had severe nausea since yesterday unable to keep anything down, pain, would like to come in for IVF today.  Gave her 9:00 am appointment with Sandi Mealy PA in [facility].

## 2019-01-04 ENCOUNTER — Encounter (HOSPITAL_COMMUNITY): Payer: Self-pay

## 2019-01-04 ENCOUNTER — Other Ambulatory Visit: Payer: Self-pay

## 2019-01-04 ENCOUNTER — Emergency Department (HOSPITAL_COMMUNITY)
Admission: EM | Admit: 2019-01-04 | Discharge: 2019-01-04 | Disposition: A | Discharge status: Home / Self Care | Payer: Medicaid Other | Attending: Emergency Medicine | Admitting: Emergency Medicine

## 2019-01-04 DIAGNOSIS — G8929 Other chronic pain: Secondary | ICD-10-CM

## 2019-01-04 LAB — COMPREHENSIVE METABOLIC PANEL
ALT: 44 U/L (ref 0–44)
AST: 53 U/L — ABNORMAL HIGH (ref 15–41)
Albumin: 3.4 g/dL — ABNORMAL LOW (ref 3.5–5.0)
Alkaline Phosphatase: 143 U/L — ABNORMAL HIGH (ref 38–126)
Anion gap: 14 (ref 5–15)
BUN: 10 mg/dL (ref 6–20)
CO2: 23 mmol/L (ref 22–32)
Calcium: 8.8 mg/dL — ABNORMAL LOW (ref 8.9–10.3)
Chloride: 102 mmol/L (ref 98–111)
Creatinine, Ser: 0.7 mg/dL (ref 0.44–1.00)
GFR calc Af Amer: >60 mL/min (ref >60)
GFR calc non Af Amer: >60 mL/min (ref >60)
Glucose, Bld: 121 mg/dL — ABNORMAL HIGH (ref 70–99)
Potassium: 3.5 mmol/L (ref 3.5–5.1)
Sodium: 139 mmol/L (ref 135–145)
Total Bilirubin: 1.2 mg/dL (ref 0.3–1.2)
Total Protein: 6.6 g/dL (ref 6.5–8.1)

## 2019-01-04 LAB — CBC
HCT: 36.9 % (ref 36.0–46.0)
Hemoglobin: 12 g/dL (ref 12.0–15.0)
MCH: 21.1 pg — ABNORMAL LOW (ref 26.0–34.0)
MCHC: 32.5 g/dL (ref 30.0–36.0)
MCV: 65 fL — ABNORMAL LOW (ref 80.0–100.0)
Platelets: 210 10*3/uL (ref 150–400)
RBC: 5.68 MIL/uL — ABNORMAL HIGH (ref 3.87–5.11)
RDW: 20.6 % — ABNORMAL HIGH (ref 11.5–15.5)
WBC: 5.5 10*3/uL (ref 4.0–10.5)
nRBC: 0.5 % — ABNORMAL HIGH (ref 0.0–0.2)

## 2019-01-04 LAB — LIPASE, BLOOD: Lipase: 15 U/L (ref 11–51)

## 2019-01-04 MED ORDER — HEPARIN SOD (PORK) LOCK FLUSH 100 UNIT/ML IV SOLN
INTRAVENOUS | Status: AC
  Administered 2019-01-04: 06:00:00 500 [IU]
  Filled 2019-01-04: qty 5

## 2019-01-04 MED ORDER — HALOPERIDOL LACTATE 5 MG/ML IJ SOLN
2.0000 mg | Freq: Once | INTRAMUSCULAR | Status: AC
  Administered 2019-01-04: 04:00:00 2 mg via INTRAVENOUS
  Filled 2019-01-04: qty 1

## 2019-01-04 MED ORDER — SODIUM CHLORIDE 0.9% FLUSH: 3.0000 mL | Freq: Once | INTRAVENOUS | Status: DC

## 2019-01-04 MED ORDER — HYDROMORPHONE HCL 1 MG/ML IJ SOLN
1.0000 mg | Freq: Once | INTRAMUSCULAR | Status: AC
  Administered 2019-01-04: 1 mg via INTRAVENOUS
  Filled 2019-01-04: qty 1

## 2019-01-04 MED ORDER — ONDANSETRON HCL 4 MG/2ML IJ SOLN: 4.0000 mg | Freq: Once | INTRAMUSCULAR | Status: DC

## 2019-01-04 MED ORDER — SODIUM CHLORIDE 0.9 % IV BOLUS
500.0000 mL | Freq: Once | INTRAVENOUS | Status: AC
  Administered 2019-01-04: 500 mL via INTRAVENOUS

## 2019-01-04 MED ORDER — KETOROLAC TROMETHAMINE 30 MG/ML IJ SOLN
30.0000 mg | Freq: Once | INTRAMUSCULAR | Status: AC
  Administered 2019-01-04: 04:00:00 30 mg via INTRAVENOUS
  Filled 2019-01-04: qty 1

## 2019-01-04 MED FILL — MORPHINE SULFATE 15 MG TABS: 15 | 10 days supply | Qty: 60 | Fill #0

## 2019-01-04 NOTE — ED Notes (Signed)
Pt asked to provide urine specimen but states she cannot void at this time.

## 2019-01-04 NOTE — ED Triage Notes (Signed)
Arrived POV patient reports increasing abdominal pain since Whipple surgery in April. Patient also reports diarrhea, nausea, and vomiting. Patient saw oncologist today and received IV fluids. Home medications not controlling diarrhea or vomiting.

## 2019-01-04 NOTE — ED Notes (Signed)
Pt reminded a urine specimen is needed

## 2019-01-04 NOTE — ED Notes (Signed)
Pt ambulatory to BR with steady gait. Pt asked to provide urine specimen but stated she was having a BM and couldn't provide urine specimen.

## 2019-01-04 NOTE — ED Notes (Signed)
Pt tolerating ginger ale. MD informed that the nausea has subsided.

## 2019-01-04 NOTE — ED Provider Notes (Signed)
[facility] DEPT Provider Note   CSN: [id number] Arrival date & time: 01/03/19  2356     History Chief Complaint  Patient presents with  . Abdominal Pain  . Emesis    Jane Brooks is a 48 y.o. female.  The history is provided by the patient.  Abdominal Pain Pain location:  Generalized Pain radiates to:  Does not radiate Pain severity:  Severe Onset quality:  Gradual Timing:  Constant Progression:  Unchanged Chronicity:  Chronic Context: not alcohol use and not awakening from sleep   Relieved by:  Nothing Worsened by:  Nothing Ineffective treatments:  None tried Associated symptoms: nausea and vomiting   Associated symptoms: no anorexia, no chest pain, no dysuria, no fever and no shortness of breath   Associated symptoms comment:  Loose stool  Risk factors: no alcohol abuse   Patient with h/o of pancreatic CA and chronic abdominal pain who was seen earlier in the day at the cancer center for pain and vomiting.  She states this did not improve post medication and she is still vomiting with pain and can't keep down her PO medications.       Past Medical History:  Diagnosis Date  . Family history of lung cancer   . Family history of prostate cancer   . Family history of uterine cancer   . Gallstones 10/2017  . Pancreatic cancer ([place])   . Pneumothorax, closed, traumatic    years ago  . PONV (postoperative nausea and vomiting)     Patient Active Problem List   Diagnosis Date Noted  . Intractable abdominal pain 11/07/2018  . Pancolitis ([place]) 10/20/2018  . Microcytic anemia 10/20/2018  . Hospital discharge follow-up 06/29/2018  . Nausea & vomiting 06/29/2018  . Colitis 06/15/2018  . Peripheral edema   . Prediabetes   . Acute blood loss anemia   . Thrombocytopenia ([place])   . Urinary retention   . Sinus tachycardia   . Debility 05/16/2018  . Acute respiratory failure with hypoxemia ([place]) 04/30/2018  . HCAP (healthcare-associated  pneumonia) 04/30/2018  . Essential hypertension 04/30/2018  . Chronic prescription opiate use 04/30/2018  . Postoperative pain 04/30/2018  . Tachycardia 04/30/2018  . Tachypnea 04/30/2018  . Acute respiratory alkalosis 04/30/2018  . Genetic testing 01/24/2018  . Family history of uterine cancer   . Family history of prostate cancer   . Family history of lung cancer   . Malignant neoplasm of head of pancreas ([place]) 11/09/2017  . Pancreatic mass   . Cholecystitis 10/30/2017  . Jaundice 10/30/2017    Past Surgical History:  Procedure Laterality Date  . BILIARY STENT PLACEMENT  10/31/2017   Procedure: BILIARY STENT PLACEMENT;  Surgeon: [name], MD;  Location: [facility] ENDOSCOPY;  Service: Gastroenterology;;  . BIOPSY  10/23/2018   Procedure: BIOPSY;  Surgeon: [name], MD;  Location: [facility] ENDOSCOPY;  Service: Endoscopy;;  . COLONOSCOPY WITH PROPOFOL N/A 10/23/2018   Procedure: COLONOSCOPY WITH PROPOFOL;  Surgeon: [name], MD;  Location: [facility] ENDOSCOPY;  Service: Endoscopy;  Laterality: N/A;  . ERCP N/A 10/31/2017   Procedure: ENDOSCOPIC RETROGRADE CHOLANGIOPANCREATOGRAPHY (ERCP);  Surgeon: [name], MD;  Location: [facility] ENDOSCOPY;  Service: Gastroenterology;  Laterality: N/A;  . ESOPHAGOGASTRODUODENOSCOPY N/A 11/08/2017   Procedure: ESOPHAGOGASTRODUODENOSCOPY (EGD);  Surgeon: Milus Banister, MD;  Location: [facility] ENDOSCOPY;  Service: Endoscopy;  Laterality: N/A;  . EUS N/A 11/08/2017   Procedure: UPPER ENDOSCOPIC ULTRASOUND (EUS) RADIAL;  Surgeon: Milus Banister, MD;  Location: [facility] ENDOSCOPY;  Service:  Endoscopy;  Laterality: N/A;  . FINE NEEDLE ASPIRATION  11/08/2017   Procedure: FINE NEEDLE ASPIRATION;  Surgeon: Milus Banister, MD;  Location: [facility] ENDOSCOPY;  Service: Endoscopy;;  . IR IMAGING GUIDED PORT INSERTION  11/15/2017  . LAPAROSCOPY N/A 04/25/2018   Procedure: LAPAROSCOPY DIAGNOSTIC;  Surgeon: Stark Klein, MD;  Location: [facility];  Service: General;  Laterality: N/A;   GENERAL AND EPIDURAL ANESTHESIA  . SPHINCTEROTOMY  10/31/2017   Procedure: SPHINCTEROTOMY;  Surgeon: [name], MD;  Location: [facility] ENDOSCOPY;  Service: Gastroenterology;;  . TONSILLECTOMY    . WHIPPLE PROCEDURE N/A 04/25/2018   Procedure: WHIPPLE PROCEDURE, RECONSTRUCTION OF PORTAL VEIN;  Surgeon: Stark Klein, MD;  Location: [facility];  Service: General;  Laterality: N/A;     OB History   No obstetric history on file.     Family History  Problem Relation Age of Onset  . Diabetes Mother   . Hypertension Mother   . COPD Mother   . Uterine cancer Mother 83       had hysterectomy  . Diabetes Sister   . Hypertension Sister   . Lung cancer Maternal Grandmother        lung cancer  . Hypertension Sister     Social History   Tobacco Use  . Smoking status: Current Every Day Smoker    Packs/day: 1.00    Years: 37.00    Pack years: 37.00    Types: Cigarettes  . Smokeless tobacco: Never Used  . Tobacco comment: Started back smoking  Substance Use Topics  . Alcohol use: Not Currently  . Drug use: Yes    Types: Marijuana    Home Medications Prior to Admission medications   Medication Sig Start Date End Date Taking? Authorizing Provider  acetaminophen (TYLENOL) 325 MG tablet Take 2 tablets (650 mg total) by mouth every 6 (six) hours as needed for mild pain (or Fever >/= 101). 11/08/18   Swayze, Ava, DO  cyclobenzaprine (FEXMID) 7.5 MG tablet Take 1 tablet (7.5 mg total) by mouth 3 (three) times daily as needed for muscle spasms. 10/11/18   Truitt Merle, MD  dicyclomine (BENTYL) 10 MG capsule Take 1 capsule (10 mg total) by mouth 3 (three) times daily between meals as needed for spasms. 01/03/19   Truitt Merle, MD  dronabinol (MARINOL) 5 MG capsule Take 1 capsule (5 mg total) by mouth 2 (two) times daily before lunch and supper. 01/03/19   Truitt Merle, MD  DULoxetine (CYMBALTA) 20 MG capsule Take 1 capsule (20 mg total) by mouth daily. 01/03/19   Truitt Merle, MD  gabapentin (NEURONTIN) 300 MG  capsule Take 1 capsule (300 mg) in AM and PM and 2 capsules (600 mg) at night 01/03/19   Truitt Merle, MD  lipase/protease/amylase (CREON) 36000 UNITS CPEP capsule Take 36,000 Units by mouth 3 (three) times daily before meals.    [provider]  morphine (MS CONTIN) 15 MG 12 hr tablet Take 1 tablet (15 mg total) by mouth every 12 (twelve) hours. 12/10/18   Truitt Merle, MD  morphine (MSIR) 15 MG tablet Take 1 tablet (15 mg total) by mouth every 4 (four) hours as needed for severe pain. 01/03/19   Truitt Merle, MD  nicotine (NICODERM CQ - DOSED IN MG/24 HR) 7 mg/24hr patch Place 1 patch (7 mg total) onto the skin daily. 11/13/18   Alla Feeling, NP  ondansetron (ZOFRAN ODT) 4 MG disintegrating tablet Take 1 tablet (4 mg total) by mouth every 6 (six) hours  as needed for nausea or vomiting. 12/10/18   Ward, Delice Bison, DO  pantoprazole (PROTONIX) 40 MG tablet Take 1 tablet (40 mg total) by mouth daily. 01/03/19   Truitt Merle, MD  polyethylene glycol (MIRALAX / GLYCOLAX) 17 g packet Take 17 g by mouth daily. 11/09/18   Swayze, Ava, DO  potassium chloride SA (KLOR-CON) 20 MEQ tablet Take 1 tablet (20 mEq total) by mouth daily. 12/02/18   Tanner, Lyndon Code., PA-C  promethazine (PHENERGAN) 25 MG tablet Take 1 tablet (25 mg total) by mouth every 6 (six) hours as needed for nausea or vomiting. 12/10/18   Ward, Delice Bison, DO  scopolamine (TRANSDERM-SCOP) 1 MG/3DAYS Place 1 patch (1.5 mg total) onto the skin every 3 (three) days. 12/25/18   Alla Feeling, NP    Allergies    Latex  Review of Systems   Review of Systems  Constitutional: Negative for fever.  HENT: Negative for congestion.   Eyes: Negative for visual disturbance.  Respiratory: Negative for shortness of breath.   Cardiovascular: Negative for chest pain.  Gastrointestinal: Positive for abdominal pain, nausea and vomiting. Negative for anorexia.  Genitourinary: Negative for dysuria.  Neurological: Negative for dizziness.    Psychiatric/Behavioral: Negative for agitation.  All other systems reviewed and are negative.   Physical Exam Updated Vital Signs BP 135/75   Pulse (!) 50   Temp 99.1 F (37.3 C) (Oral)   Resp 16   Ht 4' 10"  (1.473 m)   Wt 36.3 kg   SpO2 100%   BMI 16.72 kg/m   Physical Exam Vitals and nursing note reviewed.  Constitutional:      General: She is not in acute distress.    Appearance: Normal appearance.  HENT:     Head: Normocephalic and atraumatic.     Nose: Nose normal.  Eyes:     Conjunctiva/sclera: Conjunctivae normal.     Pupils: Pupils are equal, round, and reactive to light.  Cardiovascular:     Rate and Rhythm: Normal rate and regular rhythm.     Pulses: Normal pulses.     Heart sounds: Normal heart sounds.  Pulmonary:     Effort: Pulmonary effort is normal.     Breath sounds: Normal breath sounds.  Abdominal:     General: Abdomen is flat. Bowel sounds are normal.     Tenderness: There is no abdominal tenderness. There is no guarding or rebound.  Musculoskeletal:        General: Normal range of motion.     Cervical back: Normal range of motion and neck supple.  Skin:    General: Skin is warm and dry.     Capillary Refill: Capillary refill takes less than 2 seconds.  Neurological:     General: No focal deficit present.     Mental Status: She is alert and oriented to person, place, and time.     Deep Tendon Reflexes: Reflexes normal.  Psychiatric:        Mood and Affect: Mood normal.        Behavior: Behavior normal.     ED Results / Procedures / Treatments   Labs (all labs ordered are listed, but only abnormal results are displayed) Results for orders placed or performed during the hospital encounter of 01/04/19  Lipase, blood  Result Value Ref Range   Lipase 15 11 - 51 U/L  Comprehensive metabolic panel  Result Value Ref Range   Sodium 139 135 - 145 mmol/L   Potassium 3.5 3.5 -  5.1 mmol/L   Chloride 102 98 - 111 mmol/L   CO2 23 22 - 32 mmol/L    Glucose, Bld 121 (H) 70 - 99 mg/dL   BUN 10 6 - 20 mg/dL   Creatinine, Ser 0.70 0.44 - 1.00 mg/dL   Calcium 8.8 (L) 8.9 - 10.3 mg/dL   Total Protein 6.6 6.5 - 8.1 g/dL   Albumin 3.4 (L) 3.5 - 5.0 g/dL   AST 53 (H) 15 - 41 U/L   ALT 44 0 - 44 U/L   Alkaline Phosphatase 143 (H) 38 - 126 U/L   Total Bilirubin 1.2 0.3 - 1.2 mg/dL   GFR calc non Af Amer >60 >60 mL/min   GFR calc Af Amer >60 >60 mL/min   Anion gap 14 5 - 15  CBC  Result Value Ref Range   WBC 5.5 4.0 - 10.5 K/uL   RBC 5.68 (H) 3.87 - 5.11 MIL/uL   Hemoglobin 12.0 12.0 - 15.0 g/dL   HCT 36.9 36.0 - 46.0 %   MCV 65.0 (L) 80.0 - 100.0 fL   MCH 21.1 (L) 26.0 - 34.0 pg   MCHC 32.5 30.0 - 36.0 g/dL   RDW 20.6 (H) 11.5 - 15.5 %   Platelets 210 150 - 400 K/uL   nRBC 0.5 (H) 0.0 - 0.2 %   NM PET Image Restag (PS) Skull Base To Thigh  Result Date: 12/20/2018 CLINICAL DATA:  Subsequent treatment strategy for pancreatic neoplasm. EXAM: NUCLEAR MEDICINE PET SKULL BASE TO THIGH TECHNIQUE: 5.0 mCi F-18 FDG was injected intravenously. Full-ring PET imaging was performed from the skull base to thigh after the radiotracer. CT data was obtained and used for attenuation correction and anatomic localization. Fasting blood glucose: 84 mg/dl COMPARISON:  08/13/2018 FINDINGS: Mediastinal blood pool activity: SUV max 1.43 Liver activity: SUV max NA NECK: No hypermetabolic lymph nodes in the neck. Incidental CT findings: none CHEST: No hypermetabolic mediastinal or hilar nodes. No suspicious pulmonary nodules on the CT scan. Incidental CT findings: none ABDOMEN/PELVIS: Stable surgical changes from a Whipple procedure. Soft tissue density just posterior to the surgical clips in the pancreatic bed appears slightly thicker and more dense when compared to the prior PET-CT. SUV max is 3.53 and this is likely granulation tissue and radiation change. No definite findings for recurrent tumor. Persistent diffuse and fairly marked fatty infiltration of the  liver with areas of fatty sparing centrally. On the prior study there was a focus of increased uptake in segment 5 area with subsequent negative biopsy. This area appears slightly larger but only minimal hypermetabolism with 3.28 SUV max (previously 7.09). New area more centrally of hypermetabolism in segment 5 more medially. SUV max is 4.77. This is an indeterminate finding. Attention on future studies is suggested. MRI may be helpful further evaluation. I do not see any enlarged or hypermetabolic upper abdominal lymphadenopathy. Incidental CT findings: Stable diffuse colonic wall thickening. SKELETON: No focal hypermetabolic activity to suggest skeletal metastasis. Incidental CT findings: none IMPRESSION: 1. I do not see any definite PET-CT findings to suggest recurrent tumor or metastatic disease. 2. Slight increased soft tissue density in the pancreatic bed region is likely granulation tissue or post radiation changes but no significant hypermetabolism to suggest recurrent tumor. 3. Two areas of slight increased FDG uptake in the liver but no definite hepatic metastatic disease. MRI may be helpful for further evaluation. 4. No findings for mesenteric or retroperitoneal metastatic lymphadenopathy or omental or peritoneal carcinomatosis. Electronically Signed   By:  Marijo Sanes M.D.   On: 12/20/2018 09:37   Radiology No results found.  Procedures Procedures (including critical care time)  Medications Ordered in ED Medications  sodium chloride flush (NS) 0.9 % injection 3 mL (0 mLs Intravenous Hold 01/04/19 0127)  HYDROmorphone (DILAUDID) injection 1 mg (1 mg Intravenous Given 01/04/19 0403)  ketorolac (TORADOL) 30 MG/ML injection 30 mg (30 mg Intravenous Given 01/04/19 0406)  sodium chloride 0.9 % bolus 500 mL (0 mLs Intravenous Stopped 01/04/19 0601)  haloperidol lactate (HALDOL) injection 2 mg (2 mg Intravenous Given 01/04/19 0407)  heparin lock flush 100 UNIT/ML injection (500 Units  Given  01/04/19 4098)    ED Course  I have reviewed the triage vital signs and the nursing notes.  Pertinent labs & imaging results that were available during my care of the patient were reviewed by me and considered in my medical decision making (see chart for details). The pain is chronic.  The patient has had several CT scan this year and there is nothing different about today's pain.  Her exam and vitals are benign and reassuring and I do not believe the patient has a surgical or infectious cause of her pain.     PMP aware reviewed and her score is 600. As there is chronic pain and vomiting (though no vomiting was witnessed) I have the patient one dose of dilaudid and haldol.  She sleep soundly and awoke and PO challenged and was ready for discharge.    Jane Brooks was evaluated in Emergency Department on 01/04/2019 for the symptoms described in the history of present illness. She was evaluated in the context of the global COVID-19 pandemic, which necessitated consideration that the patient might be at risk for infection with the SARS-CoV-2 virus that causes COVID-19. Institutional protocols and algorithms that pertain to the evaluation of patients at risk for COVID-19 are in a state of rapid change based on information released by regulatory bodies including the CDC and federal and state organizations. These policies and algorithms were followed during the patient's care in the ED. Final Clinical Impression(s) / ED Diagnoses Final diagnoses:  Chronic abdominal pain    Return for intractable cough, coughing up blood,fevers >100.4 unrelieved by medication, shortness of breath, intractable vomiting, chest pain, shortness of breath, weakness,numbness, changes in speech, facial asymmetry,abdominal pain, passing out,Inability to tolerate liquids or food, cough, altered mental status or any concerns. No signs of systemic illness or infection. The patient is nontoxic-appearing on exam and vital  signs are within normal limits.   I have reviewed the triage vital signs and the nursing notes. Pertinent labs &imaging results that were available during my care of the patient were reviewed by me and considered in my medical decision making (see chart for details).  After history, exam, and medical workup I feel the patient has been appropriately medically screened and is safe for discharge home. Pertinent diagnoses were discussed with the patient. Patient was given return   Lakeem Rozo, MD 01/04/19 [phone]

## 2019-01-06 ENCOUNTER — Other Ambulatory Visit: Payer: Self-pay | Admitting: Radiation Therapy

## 2019-01-06 ENCOUNTER — Telehealth: Payer: Self-pay

## 2019-01-06 NOTE — Telephone Encounter (Signed)
Called and spoke with patient-patient advised of need to schedule appt with Dr. Lyndel Safe or Jaclyn Shaggy, NP; patient reports she wants to be seen at the [facility] office as it is closer to her home-patient declines to schedule an appt with Dr. Lyndel Safe or Jaclyn Shaggy at the [facility] office; recall placed for 1 month from now for patient as she does not want to schedule an appt at this time;

## 2019-01-06 NOTE — Progress Notes (Signed)
[facility] Progress Note   Jane Brooks [id number] [birth year] 48 y.o.  Jane Brooks is managed by Dr. Truitt Merle  Actively treated with chemotherapy/immunotherapy/hormonal therapy: no  Current therapy: supportive care and close surveillance   Next scheduled appointment with provider: 01/23/2019  Assessment: Plan:    Non-intractable vomiting with nausea, unspecified vomiting type - Plan: ondansetron (ZOFRAN) injection 8 mg, 0.9 %  sodium chloride infusion  Generalized abdominal pain - Plan: HYDROmorphone (DILAUDID) injection 0.5 mg, morphine (MSIR) 15 MG tablet, DULoxetine (CYMBALTA) 20 MG capsule, Ambulatory referral to Pain Clinic  Pancreatic adenocarcinoma ([place]) - Plan: morphine (MSIR) 15 MG tablet, Ambulatory referral to Pain Clinic, heparin lock flush 100 unit/mL  Current moderate episode of major depressive disorder, unspecified whether recurrent ([place]) - Plan: DULoxetine (CYMBALTA) 20 MG capsule   Nausea and vomiting: Jane Brooks was given 1 liter of normal saline and Zofran 8 mg IV x 1.   Abdominal pain: Jane Brooks was given Dilaudid 0.5 mg IV x 1. Dr. Burr Medico refilled MSIR and made a referral the Pain Clinic and to Palliative Care. She also referred back to Dr. Lyndel Safe who is her gastroenterologist. She was given a prescription for Cymbalta 20 mg once daily.  Pancreatic adenocarcinoma: Jane Brooks continues to be followed by Dr. Burr Medico and is managed with supportive care and close surveillance with her next appointment scheduled for 01/23/2019. Her scans continue to show no sign of residual or recurrent disease.  Depression: Jane Brooks was given  a prescription for Cymbalta 20 mg once daily.    Please see After Visit Summary for patient specific instructions.  Future Appointments  Date Time Provider [place]  01/07/2019 12:00 PM [facility] None  01/23/2019  8:30 AM [facility] LAB 5 [facility] None  01/23/2019   9:00 AM Truitt Merle, MD [facility] None    Orders Placed This Encounter  Procedures  . Ambulatory referral to Pain Clinic       Subjective:   Patient ID:  Garyn Waguespack is a 48 y.o. (DOB [birth year]) female.  Chief Complaint: No chief complaint on file.   HPI Jane Brooks Is a 48 y.o. female with a diagnosis of a malignant neoplasm of the head of the pancreas.  She is followed by Dr. Truitt Merle and is status post a Whipple's procedure and resection of her gallbladder with segment of the portal vein on 04/25/2018.  She was most recently treated with adjuvant radiation therapy by Dr. Lisbeth Renshaw from 10/09/2018 through 11/18/2018.  She has a history of abdominal pain which is believed to be secondary to colitis.  Her scans to date show no evidence of recurrent or residual cancer.  She present today with recurrent nausea, vomiting and abdominal pain. She presented to the ER last evening where she was given IV Dilaudid and Haldol. She acknowledges anorexia, weight loss, and depression. She states that she thought that her life would be back to normal after her surgery rather than be as they are now.  Medications: I have reviewed the patient's current medications.  Allergies:  Allergies  Allergen Reactions  . Latex Hives    Past Medical History:  Diagnosis Date  . Family history of lung cancer   . Family history of prostate cancer   . Family history of uterine cancer   . Gallstones 10/2017  . Pancreatic cancer ([place])   . Pneumothorax, closed, traumatic    years ago  . PONV (postoperative nausea and vomiting)     Past Surgical  History:  Procedure Laterality Date  . BILIARY STENT PLACEMENT  10/31/2017   Procedure: BILIARY STENT PLACEMENT;  Surgeon: [name], MD;  Location: [facility] ENDOSCOPY;  Service: Gastroenterology;;  . BIOPSY  10/23/2018   Procedure: BIOPSY;  Surgeon: [name], MD;  Location: [facility] ENDOSCOPY;  Service: Endoscopy;;  . COLONOSCOPY WITH PROPOFOL N/A 10/23/2018    Procedure: COLONOSCOPY WITH PROPOFOL;  Surgeon: [name], MD;  Location: [facility] ENDOSCOPY;  Service: Endoscopy;  Laterality: N/A;  . ERCP N/A 10/31/2017   Procedure: ENDOSCOPIC RETROGRADE CHOLANGIOPANCREATOGRAPHY (ERCP);  Surgeon: [name], MD;  Location: [facility] ENDOSCOPY;  Service: Gastroenterology;  Laterality: N/A;  . ESOPHAGOGASTRODUODENOSCOPY N/A 11/08/2017   Procedure: ESOPHAGOGASTRODUODENOSCOPY (EGD);  Surgeon: Milus Banister, MD;  Location: [facility] ENDOSCOPY;  Service: Endoscopy;  Laterality: N/A;  . EUS N/A 11/08/2017   Procedure: UPPER ENDOSCOPIC ULTRASOUND (EUS) RADIAL;  Surgeon: Milus Banister, MD;  Location: [facility] ENDOSCOPY;  Service: Endoscopy;  Laterality: N/A;  . FINE NEEDLE ASPIRATION  11/08/2017   Procedure: FINE NEEDLE ASPIRATION;  Surgeon: Milus Banister, MD;  Location: [facility] ENDOSCOPY;  Service: Endoscopy;;  . IR IMAGING GUIDED PORT INSERTION  11/15/2017  . LAPAROSCOPY N/A 04/25/2018   Procedure: LAPAROSCOPY DIAGNOSTIC;  Surgeon: Stark Klein, MD;  Location: [place];  Service: General;  Laterality: N/A;  GENERAL AND EPIDURAL ANESTHESIA  . SPHINCTEROTOMY  10/31/2017   Procedure: SPHINCTEROTOMY;  Surgeon: [name], MD;  Location: [facility] ENDOSCOPY;  Service: Gastroenterology;;  . TONSILLECTOMY    . WHIPPLE PROCEDURE N/A 04/25/2018   Procedure: WHIPPLE PROCEDURE, RECONSTRUCTION OF PORTAL VEIN;  Surgeon: Stark Klein, MD;  Location: [place];  Service: General;  Laterality: N/A;    Family History  Problem Relation Age of Onset  . Diabetes Mother   . Hypertension Mother   . COPD Mother   . Uterine cancer Mother 46       had hysterectomy  . Diabetes Sister   . Hypertension Sister   . Lung cancer Maternal Grandmother        lung cancer  . Hypertension Sister     Social History   Socioeconomic History  . Marital status: Single    Spouse name: Not on file  . Number of children: Not on file  . Years of education: Not on file  . Highest education level: Not on file    Occupational History  . Occupation: unemployed  Tobacco Use  . Smoking status: Current Every Day Smoker    Packs/day: 1.00    Years: 37.00    Pack years: 37.00    Types: Cigarettes  . Smokeless tobacco: Never Used  . Tobacco comment: Started back smoking  Substance and Sexual Activity  . Alcohol use: Not Currently  . Drug use: Yes    Types: Marijuana  . Sexual activity: Not Currently  Other Topics Concern  . Not on file  Social History Narrative  . Not on file   Social Determinants of Health   Financial Resource Strain:   . Difficulty of Paying Living Expenses: Not on file  Food Insecurity:   . Worried About Charity fundraiser in the Last Year: Not on file  . Ran Out of Food in the Last Year: Not on file  Transportation Needs: No Transportation Needs  . Lack of Transportation (Medical): No  . Lack of Transportation (Non-Medical): No  Physical Activity:   . Days of Exercise per Week: Not on file  . Minutes of Exercise per Session: Not on file  Stress:   .  Feeling of Stress : Not on file  Social Connections:   . Frequency of Communication with Friends and Family: Not on file  . Frequency of Social Gatherings with Friends and Family: Not on file  . Attends Religious Services: Not on file  . Active Member of Clubs or Organizations: Not on file  . Attends Archivist Meetings: Not on file  . Marital Status: Not on file  Intimate Partner Violence:   . Fear of Current or Ex-Partner: Not on file  . Emotionally Abused: Not on file  . Physically Abused: Not on file  . Sexually Abused: Not on file    Past Medical History, Surgical history, Social history, and Family history were reviewed and updated as appropriate.   Please see review of systems for further details on the patient's review from today.   Review of Systems:  Review of Systems  Constitutional: Positive for appetite change and unexpected weight change. Negative for activity change, chills,  diaphoresis and fever.  HENT: Negative for trouble swallowing.   Respiratory: Negative for cough, choking, chest tightness, shortness of breath and wheezing.   Cardiovascular: Negative for chest pain, palpitations and leg swelling.  Gastrointestinal: Positive for abdominal pain, nausea and vomiting. Negative for abdominal distention, constipation and diarrhea.  Genitourinary: Negative for decreased urine volume.  Neurological: Negative for headaches.  Psychiatric/Behavioral: Positive for dysphoric mood.    Objective:   Physical Exam:  BP 119/84 (BP Location: Left Arm, Patient Position: Sitting)   Pulse (!) 107 Comment: notified nurse  Temp 98.3 F (36.8 C) (Temporal)   Resp 19   Ht 4' 10"  (1.473 m)   Wt 87 lb 14.4 oz (39.9 kg)   SpO2 100%   BMI 18.37 kg/m  ECOG: 1  Physical Exam Constitutional:      General: She is not in acute distress.    Appearance: She is not diaphoretic.  HENT:     Head: Normocephalic and atraumatic.     Mouth/Throat:     Pharynx: No oropharyngeal exudate.  Cardiovascular:     Rate and Rhythm: Normal rate and regular rhythm.     Heart sounds: Normal heart sounds. No murmur. No friction rub. No gallop.   Pulmonary:     Effort: Pulmonary effort is normal. No respiratory distress.     Breath sounds: Normal breath sounds. No wheezing or rales.  Abdominal:     General: Bowel sounds are normal. There is no distension.     Palpations: Abdomen is soft. There is no mass.     Tenderness: There is abdominal tenderness (generalized). There is no guarding or rebound.  Musculoskeletal:     Cervical back: Normal range of motion and neck supple.  Lymphadenopathy:     Cervical: No cervical adenopathy.  Skin:    General: Skin is warm and dry.     Findings: No erythema or rash.  Neurological:     Mental Status: She is alert.     Coordination: Coordination normal.     Gait: Gait normal.     Lab Review:     Component Value Date/Time   NA 139 01/04/2019  0132   K 3.5 01/04/2019 0132   CL 102 01/04/2019 0132   CO2 23 01/04/2019 0132   GLUCOSE 121 (H) 01/04/2019 0132   BUN 10 01/04/2019 0132   CREATININE 0.70 01/04/2019 0132   CREATININE 0.65 12/25/2018 1430   CALCIUM 8.8 (L) 01/04/2019 0132   PROT 6.6 01/04/2019 0132   ALBUMIN 3.4 (L)  01/04/2019 0132   AST 53 (H) 01/04/2019 0132   AST 47 (H) 12/25/2018 1430   ALT 44 01/04/2019 0132   ALT 40 12/25/2018 1430   ALKPHOS 143 (H) 01/04/2019 0132   BILITOT 1.2 01/04/2019 0132   BILITOT 0.6 12/25/2018 1430   GFRNONAA >60 01/04/2019 0132   GFRNONAA >60 12/25/2018 1430   GFRAA >60 01/04/2019 0132   GFRAA >60 12/25/2018 1430       Component Value Date/Time   WBC 5.5 01/04/2019 0132   RBC 5.68 (H) 01/04/2019 0132   HGB 12.0 01/04/2019 0132   HGB 9.3 (L) 12/25/2018 1430   HCT 36.9 01/04/2019 0132   PLT 210 01/04/2019 0132   PLT 150 12/25/2018 1430   MCV 65.0 (L) 01/04/2019 0132   MCH 21.1 (L) 01/04/2019 0132   MCHC 32.5 01/04/2019 0132   RDW 20.6 (H) 01/04/2019 0132   LYMPHSABS 0.5 (L) 12/25/2018 1430   MONOABS 0.4 12/25/2018 1430   EOSABS 0.1 12/25/2018 1430   BASOSABS 0.0 12/25/2018 1430   -------------------------------  Imaging from last 24 hours (if applicable):  Radiology interpretation: NM PET Image Restag (PS) Skull Base To Thigh  Result Date: 12/20/2018 CLINICAL DATA:  Subsequent treatment strategy for pancreatic neoplasm. EXAM: NUCLEAR MEDICINE PET SKULL BASE TO THIGH TECHNIQUE: 5.0 mCi F-18 FDG was injected intravenously. Full-ring PET imaging was performed from the skull base to thigh after the radiotracer. CT data was obtained and used for attenuation correction and anatomic localization. Fasting blood glucose: 84 mg/dl COMPARISON:  08/13/2018 FINDINGS: Mediastinal blood pool activity: SUV max 1.43 Liver activity: SUV max NA NECK: No hypermetabolic lymph nodes in the neck. Incidental CT findings: none CHEST: No hypermetabolic mediastinal or hilar nodes. No suspicious  pulmonary nodules on the CT scan. Incidental CT findings: none ABDOMEN/PELVIS: Stable surgical changes from a Whipple procedure. Soft tissue density Brooks posterior to the surgical clips in the pancreatic bed appears slightly thicker and more dense when compared to the prior PET-CT. SUV max is 3.53 and this is likely granulation tissue and radiation change. No definite findings for recurrent tumor. Persistent diffuse and fairly marked fatty infiltration of the liver with areas of fatty sparing centrally. On the prior study there was a focus of increased uptake in segment 5 area with subsequent negative biopsy. This area appears slightly larger but only minimal hypermetabolism with 3.28 SUV max (previously 7.09). New area more centrally of hypermetabolism in segment 5 more medially. SUV max is 4.77. This is an indeterminate finding. Attention on future studies is suggested. MRI may be helpful further evaluation. I do not see any enlarged or hypermetabolic upper abdominal lymphadenopathy. Incidental CT findings: Stable diffuse colonic wall thickening. SKELETON: No focal hypermetabolic activity to suggest skeletal metastasis. Incidental CT findings: none IMPRESSION: 1. I do not see any definite PET-CT findings to suggest recurrent tumor or metastatic disease. 2. Slight increased soft tissue density in the pancreatic bed region is likely granulation tissue or post radiation changes but no significant hypermetabolism to suggest recurrent tumor. 3. Two areas of slight increased FDG uptake in the liver but no definite hepatic metastatic disease. MRI may be helpful for further evaluation. 4. No findings for mesenteric or retroperitoneal metastatic lymphadenopathy or omental or peritoneal carcinomatosis. Electronically Signed   By: Marijo Sanes M.D.   On: 12/20/2018 09:37        This patient was seen with Dr. Burr Medico with my treatment plan reviewed with her. She expressed agreement with my medical management of this  patient.

## 2019-01-06 NOTE — Telephone Encounter (Signed)
-----   Message from [name], MD sent at 01/05/2019  3:59 PM EST ----- Pl work her into [facility] clinic or my clinic Early Jan Thx  [place] ----- Message ----- From: Truitt Merle, MD Sent: 01/03/2019  11:06 AM EST To: Jonnie Finner, RN, Stark Klein, MD, #  Dr. Lyndel Safe,  Could  you see her back? She has had recurrent severe abdominal pain since her Whipple surgery several months ago, I have repeated her scan frequently, no evidence of cancer recurrence on scan so far. I am not sure why she has this degree of pain, requires high dose narcotics. Any GI causes? IBS? I would appreciate your input.  Thanks much,  Krista Blue

## 2019-01-07 ENCOUNTER — Inpatient Hospital Stay: Payer: Medicaid Other

## 2019-01-07 NOTE — Telephone Encounter (Signed)
Bre, In that case, can you please schedule her with Nevin Bloodgood. I see that she knows the patient. That would be at [place] (closer to her home)  RG

## 2019-01-08 NOTE — Telephone Encounter (Signed)
Left message for patient to call back to the office;  

## 2019-01-09 NOTE — Telephone Encounter (Signed)
Left message for patient to call back to the office;  

## 2019-01-13 ENCOUNTER — Telehealth: Payer: Self-pay

## 2019-01-13 ENCOUNTER — Other Ambulatory Visit: Payer: Self-pay | Admitting: Nurse Practitioner

## 2019-01-13 DIAGNOSIS — R1084 Generalized abdominal pain: Secondary | ICD-10-CM

## 2019-01-13 DIAGNOSIS — C259 Malignant neoplasm of pancreas, unspecified: Secondary | ICD-10-CM

## 2019-01-13 MED ORDER — MORPHINE SULFATE ER 15 MG PO TBCR: 15.0000 mg | EXTENDED_RELEASE_TABLET | Freq: Two times a day (BID) | ORAL | 0 refills | Status: AC

## 2019-01-13 MED ORDER — MORPHINE SULFATE 15 MG PO TABS: 15.0000 mg | ORAL_TABLET | ORAL | 0 refills | Status: AC | PRN

## 2019-01-13 MED FILL — MORPHINE SULF ER 15 MG TAB: 15 | 30 days supply | Qty: 60 | Fill #0

## 2019-01-13 MED FILL — MORPHINE SULFATE 15 MG TABS: 15 | 10 days supply | Qty: 60 | Fill #0

## 2019-01-13 NOTE — Telephone Encounter (Signed)
Patient calls for refills on MSIR 15 mg tabs (last filled 12/18 #60) and MS Contin 15 mg (last filled 11/24 #60) She would like them sent into [facility] pharmacy.

## 2019-01-15 NOTE — Telephone Encounter (Signed)
Left message for patient to call back to the office;  

## 2019-01-16 NOTE — Telephone Encounter (Signed)
Left message for patient to call back to the office;  

## 2019-01-23 ENCOUNTER — Ambulatory Visit: Payer: Medicaid Other | Admitting: Hematology

## 2019-01-23 ENCOUNTER — Other Ambulatory Visit: Payer: Medicaid Other

## 2019-02-17 DEATH — deceased

## 2019-06-19 IMAGING — RF UPPER GI SERIES (WITHOUT KUB)
10 of 11 series · 12 of 24 positions shown · non-contrast
Comparison: CT 06/15/2018

CLINICAL DATA: Post Whipple procedure.  Vomiting.

EXAM:
UPPER GI SERIES WITH KUB
TECHNIQUE: After obtaining a scout radiograph a routine upper GI series was
performed using thin barium
FLUOROSCOPY TIME:  Fluoroscopy Time:  2 minutes 12 seconds
Radiation Exposure Index (if provided by the fluoroscopic device):
Number of Acquired Spot Images: 0

[Series 2: cp_standard · 0.34mm/px · 1 of 66 frames shown (1 of 10)]
[frame 34/66]
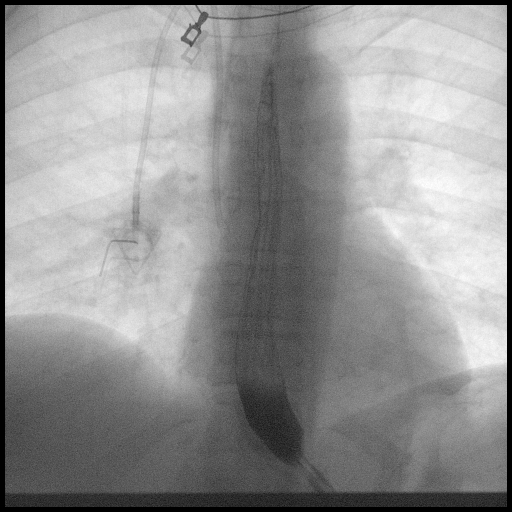

[Series 3: cp_standard · 0.34mm/px · 1 of 98 frames shown (2 of 10)]
[frame 15/98]
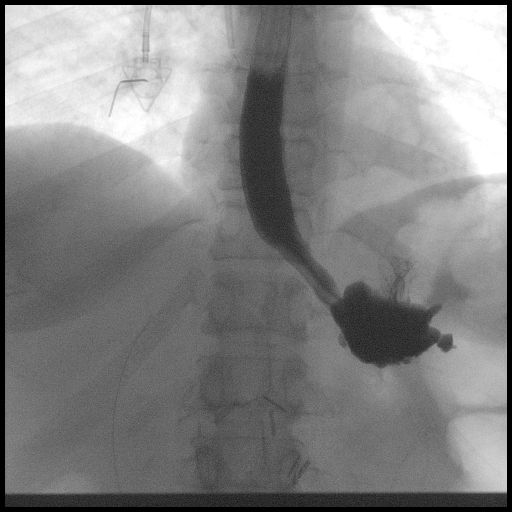

[Series 4: cp_standard · 0.34mm/px · 2 of 141 frames shown (3 of 10)]
[frame 16/141]
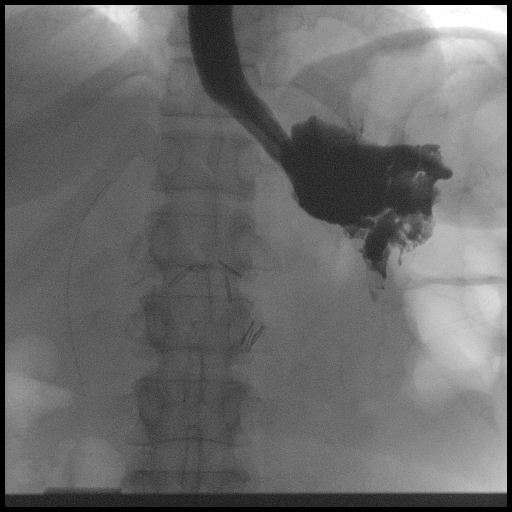
[frame 120/141]
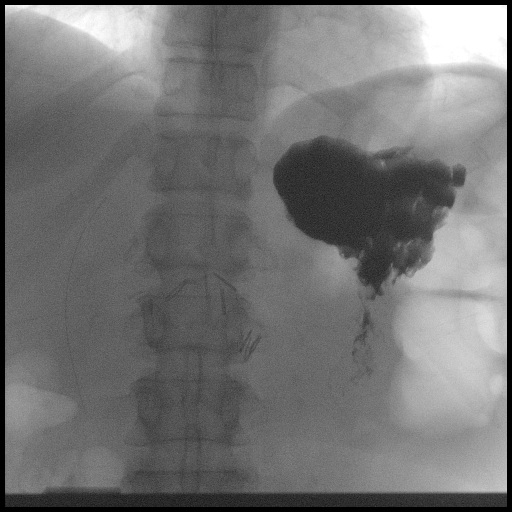

[Series 5: cp_standard · 0.34mm/px · 1 of 53 frames shown (4 of 10)]
[frame 46/53]
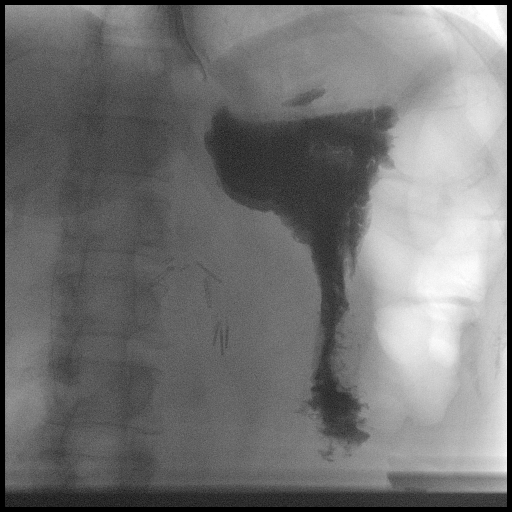

[Series 6: cp_standard · 0.34mm/px · 1 of 26 frames shown (5 of 10)]
[frame 17/26]
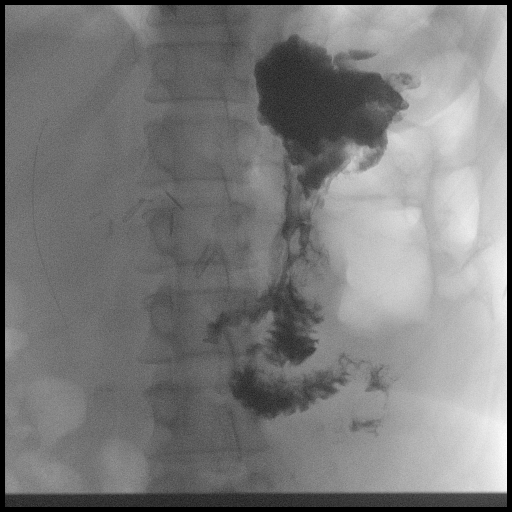

[Series 7: cp_standard · 0.34mm/px · 1 of 54 frames shown (6 of 10)]
[frame 31/54]
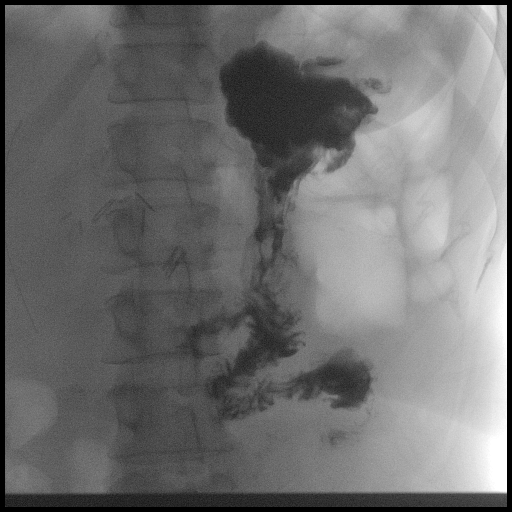

[Series 8: cp_standard · 0.34mm/px · 1 of 44 frames shown (7 of 10)]
[frame 21/44]
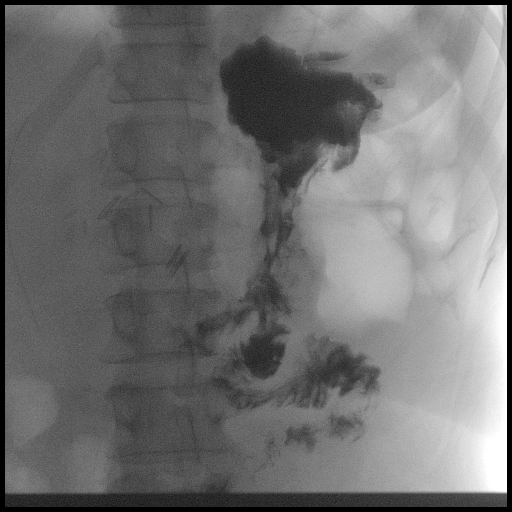

[Series 9: cp_standard · 0.34mm/px · 1 of 32 frames shown (8 of 10)]
[frame 9/32]
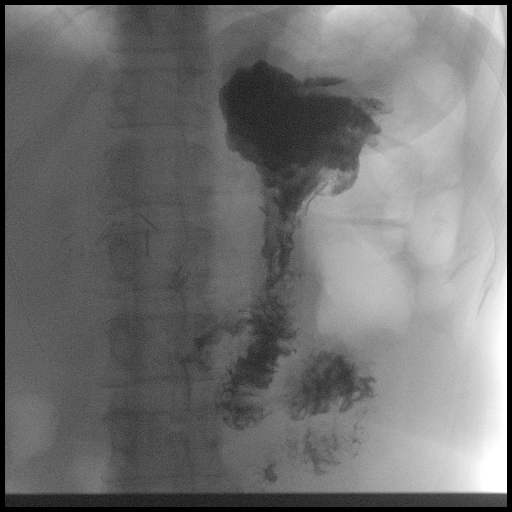

[Series 10: cp_standard · 0.34mm/px · 1 of 117 frames shown (9 of 10)]
[frame 18/117]
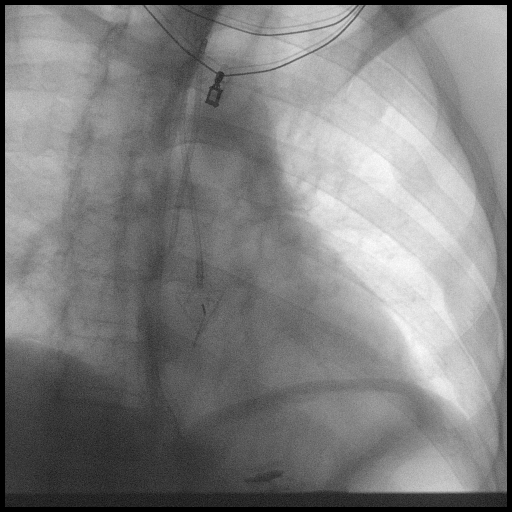

[Series 11: cp_standard · 0.34mm/px · 2 of 50 frames shown (10 of 10)]
[frame 8/50]
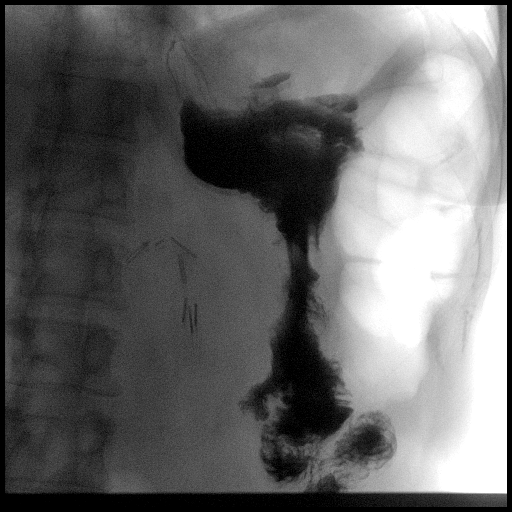
[frame 45/50]
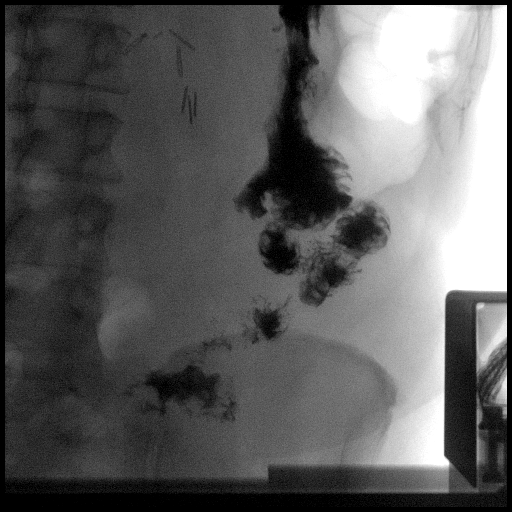

[12 of 24 positions shown; findings below may reference images not displayed]

FINDINGS: Scout film of the abdomen demonstrates postoperative changes. No
evidence of bowel obstruction. The stent which was previously
located in the pancreatic duct but was noticed to have migrated
across the choledochojejunostomy on recent CT is seen in the right
upper quadrant.

Fluoroscopic evaluation of swallowing demonstrates normal esophageal
motility. No esophageal stricture. Small gastric pouch noted. Slight
delay in passage of contrast across the gastrojejunostomy, but the
stomach finally empties. There is a small outpouching of contrast
noted along the superior margin of the gastric pouch, felt to
represent a small intraluminal outpouching in the region of the
gastric cardia rather than leak. Proximal small bowel appears
normal.
IMPRESSION: Slight delayed emptying of the gastric pouch through the
gastrojejunostomy, but the stomach finding empties. No evidence of
obstruction. The proximal small bowel appears normal.

Small outpouching of contrast along the superior margin of the
stomach felt represent a small gastric outpouching rather than a
leak.

## 2019-07-28 ENCOUNTER — Other Ambulatory Visit: Payer: Self-pay | Admitting: Hematology
